# Patient Record
Sex: Male | Born: 1960 | Race: Black or African American | Hispanic: No | Marital: Single | Smoking: Current every day smoker
Health system: Southern US, Community
[De-identification: ages and names within clinical notes are randomized; demographics above are authoritative.]

## PROBLEM LIST (undated history)

## (undated) DIAGNOSIS — Z9151 Personal history of suicidal behavior: Secondary | ICD-10-CM

## (undated) DIAGNOSIS — I2510 Atherosclerotic heart disease of native coronary artery without angina pectoris: Secondary | ICD-10-CM

## (undated) DIAGNOSIS — Z91148 Patient's other noncompliance with medication regimen for other reason: Secondary | ICD-10-CM

## (undated) DIAGNOSIS — I10 Essential (primary) hypertension: Secondary | ICD-10-CM

## (undated) DIAGNOSIS — Z9114 Patient's other noncompliance with medication regimen: Secondary | ICD-10-CM

## (undated) DIAGNOSIS — F32A Depression, unspecified: Secondary | ICD-10-CM

## (undated) DIAGNOSIS — I119 Hypertensive heart disease without heart failure: Secondary | ICD-10-CM

## (undated) DIAGNOSIS — I422 Other hypertrophic cardiomyopathy: Secondary | ICD-10-CM

## (undated) DIAGNOSIS — F319 Bipolar disorder, unspecified: Secondary | ICD-10-CM

## (undated) DIAGNOSIS — R55 Syncope and collapse: Secondary | ICD-10-CM

## (undated) DIAGNOSIS — Z915 Personal history of self-harm: Secondary | ICD-10-CM

## (undated) DIAGNOSIS — F1910 Other psychoactive substance abuse, uncomplicated: Secondary | ICD-10-CM

## (undated) DIAGNOSIS — IMO0001 Reserved for inherently not codable concepts without codable children: Secondary | ICD-10-CM

## (undated) DIAGNOSIS — F329 Major depressive disorder, single episode, unspecified: Secondary | ICD-10-CM

## (undated) DIAGNOSIS — R440 Auditory hallucinations: Secondary | ICD-10-CM

## (undated) DIAGNOSIS — I639 Cerebral infarction, unspecified: Secondary | ICD-10-CM

## (undated) DIAGNOSIS — I350 Nonrheumatic aortic (valve) stenosis: Secondary | ICD-10-CM

## (undated) DIAGNOSIS — K219 Gastro-esophageal reflux disease without esophagitis: Secondary | ICD-10-CM

## (undated) DIAGNOSIS — I319 Disease of pericardium, unspecified: Secondary | ICD-10-CM

## (undated) HISTORY — DX: Nonrheumatic aortic (valve) stenosis: I35.0

## (undated) HISTORY — PX: PERICARDIAL FLUID DRAINAGE: SHX5100

## (undated) HISTORY — DX: Other hypertrophic cardiomyopathy: I42.2

## (undated) HISTORY — DX: Hypertensive heart disease without heart failure: I11.9

## (undated) HISTORY — DX: Bipolar disorder, unspecified: F31.9

## (undated) HISTORY — DX: Syncope and collapse: R55

## (undated) HISTORY — DX: Reserved for inherently not codable concepts without codable children: IMO0001

## (undated) HISTORY — DX: Atherosclerotic heart disease of native coronary artery without angina pectoris: I25.10

---

## 2003-08-14 ENCOUNTER — Other Ambulatory Visit: Payer: Self-pay

## 2004-05-09 ENCOUNTER — Inpatient Hospital Stay: Payer: Self-pay | Admitting: Unknown Physician Specialty

## 2004-06-09 ENCOUNTER — Emergency Department: Payer: Self-pay | Admitting: Emergency Medicine

## 2004-08-13 ENCOUNTER — Ambulatory Visit: Payer: Self-pay | Admitting: Psychiatry

## 2004-08-13 ENCOUNTER — Inpatient Hospital Stay (HOSPITAL_COMMUNITY): Admission: AD | Admit: 2004-08-13 | Discharge: 2004-08-20 | Payer: Self-pay | Admitting: Psychiatry

## 2004-08-13 ENCOUNTER — Emergency Department: Payer: Self-pay | Admitting: Emergency Medicine

## 2005-01-15 ENCOUNTER — Other Ambulatory Visit: Payer: Self-pay

## 2005-01-15 ENCOUNTER — Emergency Department: Payer: Self-pay | Admitting: Unknown Physician Specialty

## 2005-04-12 ENCOUNTER — Inpatient Hospital Stay: Payer: Self-pay | Admitting: Unknown Physician Specialty

## 2005-04-14 ENCOUNTER — Other Ambulatory Visit: Payer: Self-pay

## 2007-05-27 ENCOUNTER — Emergency Department: Payer: Self-pay | Admitting: Emergency Medicine

## 2008-06-21 ENCOUNTER — Emergency Department: Payer: Self-pay | Admitting: Emergency Medicine

## 2008-12-29 ENCOUNTER — Inpatient Hospital Stay (HOSPITAL_COMMUNITY): Admission: EM | Admit: 2008-12-29 | Discharge: 2009-01-04 | Payer: Self-pay | Admitting: Emergency Medicine

## 2008-12-29 ENCOUNTER — Ambulatory Visit: Payer: Self-pay | Admitting: Internal Medicine

## 2008-12-29 ENCOUNTER — Ambulatory Visit: Payer: Self-pay | Admitting: Cardiology

## 2008-12-30 ENCOUNTER — Encounter: Payer: Self-pay | Admitting: Cardiovascular Disease

## 2008-12-30 ENCOUNTER — Encounter (INDEPENDENT_AMBULATORY_CARE_PROVIDER_SITE_OTHER): Payer: Self-pay | Admitting: Internal Medicine

## 2008-12-30 ENCOUNTER — Encounter: Payer: Self-pay | Admitting: Internal Medicine

## 2008-12-31 ENCOUNTER — Encounter: Payer: Self-pay | Admitting: Internal Medicine

## 2008-12-31 ENCOUNTER — Ambulatory Visit: Payer: Self-pay | Admitting: Vascular Surgery

## 2009-01-02 ENCOUNTER — Encounter: Payer: Self-pay | Admitting: Cardiovascular Disease

## 2009-01-25 DIAGNOSIS — I10 Essential (primary) hypertension: Secondary | ICD-10-CM

## 2009-03-07 ENCOUNTER — Encounter (INDEPENDENT_AMBULATORY_CARE_PROVIDER_SITE_OTHER): Payer: Self-pay | Admitting: *Deleted

## 2010-03-06 NOTE — Letter (Signed)
Summary: Appointment - Missed  [facility], Main Office  [address]   [place], [place] [address]   Phone: [phone]  Fax: [phone]     March 07, 2009 MRN: [id number]   [facility] [place] [address] [place], [place]  [address]   Dear Mr. Lofaso,  Our records indicate you missed your appointment on 03/06/2009 with Dr. Eden Emms.   It is very important that we reach you to reschedule this appointment. We look forward to participating in your health care needs. Please contact us at the number listed above at your earliest convenience to reschedule this appointment.     Sincerely,   Migdalia Dk [facility] Scheduling Team

## 2010-05-08 LAB — GLUCOSE, CAPILLARY: Glucose-Capillary: 188 mg/dL — ABNORMAL HIGH (ref 70–99)

## 2010-05-09 LAB — AFB CULTURE WITH SMEAR (NOT AT ARMC): Acid Fast Smear: NONE SEEN

## 2010-05-09 LAB — HEPATIC FUNCTION PANEL
ALT: 62 U/L — ABNORMAL HIGH (ref 0–53)
Bilirubin, Direct: 0.2 mg/dL (ref 0.0–0.3)
Indirect Bilirubin: 0.4 mg/dL (ref 0.3–0.9)
Total Bilirubin: 0.6 mg/dL (ref 0.3–1.2)

## 2010-05-09 LAB — BASIC METABOLIC PANEL
BUN: 13 mg/dL (ref 6–23)
BUN: 14 mg/dL (ref 6–23)
BUN: 16 mg/dL (ref 6–23)
BUN: 18 mg/dL (ref 6–23)
BUN: 20 mg/dL (ref 6–23)
CO2: 25 mEq/L (ref 19–32)
CO2: 27 mEq/L (ref 19–32)
Calcium: 8.9 mg/dL (ref 8.4–10.5)
Calcium: 9 mg/dL (ref 8.4–10.5)
Chloride: 101 mEq/L (ref 96–112)
Chloride: 101 mEq/L (ref 96–112)
Chloride: 102 mEq/L (ref 96–112)
Chloride: 99 mEq/L (ref 96–112)
Creatinine, Ser: 1.04 mg/dL (ref 0.4–1.5)
Creatinine, Ser: 1.07 mg/dL (ref 0.4–1.5)
Creatinine, Ser: 1.09 mg/dL (ref 0.4–1.5)
Creatinine, Ser: 1.14 mg/dL (ref 0.4–1.5)
GFR calc Af Amer: >60 mL/min (ref >60)
GFR calc non Af Amer: >60 mL/min (ref >60)
GFR calc non Af Amer: >60 mL/min (ref >60)
GFR calc non Af Amer: >60 mL/min (ref >60)
Glucose, Bld: 177 mg/dL — ABNORMAL HIGH (ref 70–99)
Glucose, Bld: 241 mg/dL — ABNORMAL HIGH (ref 70–99)
Glucose, Bld: 353 mg/dL — ABNORMAL HIGH (ref 70–99)
Potassium: 3.5 mEq/L (ref 3.5–5.1)
Potassium: 3.7 mEq/L (ref 3.5–5.1)
Potassium: 3.8 mEq/L (ref 3.5–5.1)
Potassium: 3.9 mEq/L (ref 3.5–5.1)
Sodium: 134 mEq/L — ABNORMAL LOW (ref 135–145)
Sodium: 137 mEq/L (ref 135–145)

## 2010-05-09 LAB — GLUCOSE, CAPILLARY
Glucose-Capillary: 181 mg/dL — ABNORMAL HIGH (ref 70–99)
Glucose-Capillary: 184 mg/dL — ABNORMAL HIGH (ref 70–99)
Glucose-Capillary: 200 mg/dL — ABNORMAL HIGH (ref 70–99)
Glucose-Capillary: 208 mg/dL — ABNORMAL HIGH (ref 70–99)
Glucose-Capillary: 210 mg/dL — ABNORMAL HIGH (ref 70–99)
Glucose-Capillary: 231 mg/dL — ABNORMAL HIGH (ref 70–99)
Glucose-Capillary: 233 mg/dL — ABNORMAL HIGH (ref 70–99)
Glucose-Capillary: 233 mg/dL — ABNORMAL HIGH (ref 70–99)
Glucose-Capillary: 240 mg/dL — ABNORMAL HIGH (ref 70–99)
Glucose-Capillary: 291 mg/dL — ABNORMAL HIGH (ref 70–99)
Glucose-Capillary: 349 mg/dL — ABNORMAL HIGH (ref 70–99)

## 2010-05-09 LAB — LIPID PANEL
Cholesterol: 115 mg/dL (ref 0–200)
LDL Cholesterol: 66 mg/dL (ref 0–99)
Total CHOL/HDL Ratio: 5 RATIO
VLDL: 26 mg/dL (ref 0–40)

## 2010-05-09 LAB — CBC
HCT: 38.4 % — ABNORMAL LOW (ref 39.0–52.0)
HCT: 39.5 % (ref 39.0–52.0)
HCT: 39.5 % (ref 39.0–52.0)
HCT: 39.7 % (ref 39.0–52.0)
HCT: 40.2 % (ref 39.0–52.0)
Hemoglobin: 13.6 g/dL (ref 13.0–17.0)
Hemoglobin: 13.7 g/dL (ref 13.0–17.0)
MCHC: 34.8 g/dL (ref 30.0–36.0)
MCHC: 35.3 g/dL (ref 30.0–36.0)
MCV: 90.9 fL (ref 78.0–100.0)
MCV: 91.8 fL (ref 78.0–100.0)
MCV: 92 fL (ref 78.0–100.0)
MCV: 92.1 fL (ref 78.0–100.0)
MCV: 92.3 fL (ref 78.0–100.0)
Platelets: 289 10*3/uL (ref 150–400)
Platelets: 305 10*3/uL (ref 150–400)
Platelets: 329 10*3/uL (ref 150–400)
Platelets: 342 10*3/uL (ref 150–400)
RBC: 4.32 MIL/uL (ref 4.22–5.81)
RBC: 4.4 MIL/uL (ref 4.22–5.81)
RBC: 4.42 MIL/uL (ref 4.22–5.81)
RDW: 12 % (ref 11.5–15.5)
RDW: 12.6 % (ref 11.5–15.5)
WBC: 10.2 10*3/uL (ref 4.0–10.5)
WBC: 11 10*3/uL — ABNORMAL HIGH (ref 4.0–10.5)
WBC: 11.2 10*3/uL — ABNORMAL HIGH (ref 4.0–10.5)
WBC: 12.2 10*3/uL — ABNORMAL HIGH (ref 4.0–10.5)
WBC: 8.5 10*3/uL (ref 4.0–10.5)
WBC: 9.3 10*3/uL (ref 4.0–10.5)

## 2010-05-09 LAB — CARDIAC PANEL(CRET KIN+CKTOT+MB+TROPI)
CK, MB: 2.3 ng/mL (ref 0.3–4.0)
CK, MB: 2.5 ng/mL (ref 0.3–4.0)
CK, MB: 2.6 ng/mL (ref 0.3–4.0)
Relative Index: 0.5 (ref 0.0–2.5)
Total CK: 422 U/L — ABNORMAL HIGH (ref 7–232)
Total CK: 468 U/L — ABNORMAL HIGH (ref 7–232)
Total CK: 494 U/L — ABNORMAL HIGH (ref 7–232)
Troponin I: 0.03 ng/mL (ref 0.00–0.06)
Troponin I: 0.06 ng/mL (ref 0.00–0.06)

## 2010-05-09 LAB — COMPREHENSIVE METABOLIC PANEL
Alkaline Phosphatase: 67 U/L (ref 39–117)
BUN: 15 mg/dL (ref 6–23)
Chloride: 105 mEq/L (ref 96–112)
Creatinine, Ser: 1.11 mg/dL (ref 0.4–1.5)
GFR calc non Af Amer: >60 mL/min (ref >60)
Glucose, Bld: 214 mg/dL — ABNORMAL HIGH (ref 70–99)
Potassium: 3.9 mEq/L (ref 3.5–5.1)
Total Bilirubin: 1 mg/dL (ref 0.3–1.2)

## 2010-05-09 LAB — RAPID URINE DRUG SCREEN, HOSP PERFORMED
Amphetamines: NOT DETECTED
Barbiturates: NOT DETECTED
Benzodiazepines: NOT DETECTED

## 2010-05-09 LAB — POCT CARDIAC MARKERS
CKMB, poc: 2.6 ng/mL (ref 1.0–8.0)
Myoglobin, poc: 154 ng/mL (ref 12–200)
Myoglobin, poc: 200 ng/mL (ref 12–200)

## 2010-05-09 LAB — BODY FLUID CULTURE

## 2010-05-09 LAB — HEPATITIS PANEL, ACUTE
HCV Ab: NEGATIVE
Hep B C IgM: NEGATIVE
Hepatitis B Surface Ag: NEGATIVE

## 2010-05-09 LAB — PH, BODY FLUID: pH, Fluid: 8.5

## 2010-05-09 LAB — DIFFERENTIAL
Lymphs Abs: 2.2 10*3/uL (ref 0.7–4.0)
Monocytes Relative: 8 % (ref 3–12)
Neutro Abs: 8.6 10*3/uL — ABNORMAL HIGH (ref 1.7–7.7)
Neutrophils Relative %: 70 % (ref 43–77)

## 2010-05-09 LAB — GRAM STAIN

## 2010-05-09 LAB — LACTATE DEHYDROGENASE, PLEURAL OR PERITONEAL FLUID: LD, Fluid: 2116 U/L — ABNORMAL HIGH (ref 3–23)

## 2010-05-09 LAB — AMYLASE, BODY FLUID: Amylase, Fluid: 75 U/L

## 2010-05-09 LAB — TSH: TSH: 0.682 u[IU]/mL (ref 0.350–4.500)

## 2010-05-09 LAB — BODY FLUID CELL COUNT WITH DIFFERENTIAL
Eos, Fluid: 4 %
Monocyte-Macrophage-Serous Fluid: 15 % — ABNORMAL LOW (ref 50–90)
Other Cells, Fluid: 0 %

## 2010-05-09 LAB — HEMOGLOBIN A1C: Mean Plasma Glucose: 186 mg/dL

## 2010-05-09 LAB — GLUCOSE, SEROUS FLUID: Glucose, Fluid: 231 mg/dL

## 2010-05-09 LAB — SEDIMENTATION RATE: Sed Rate: 22 mm/hr — ABNORMAL HIGH (ref 0–16)

## 2010-05-09 LAB — C-REACTIVE PROTEIN: CRP: 5.9 mg/dL — ABNORMAL HIGH (ref <0.6)

## 2010-05-09 LAB — PROTEIN, BODY FLUID

## 2010-06-22 NOTE — Discharge Summary (Signed)
NAME:  JOHNJOSEPH, ROLFE NO.:  [id number]   MEDICAL RECORD NO.:  [id number]          PATIENT TYPE:  IPS   LOCATION:  0402                          FACILITY:  [facility]   PHYSICIAN:  Geoffery Lyons, M.D.      DATE OF BIRTH:  January 11, 1961   DATE OF ADMISSION:  08/13/2004  DATE OF DISCHARGE:  08/20/2004                                 DISCHARGE SUMMARY   CHIEF COMPLAINT AND PRESENT ILLNESS:  This was the first admission to [facility] for this 50 year old single African-American male  involuntarily committed.  History of depression.  Stated that his depression  has been increasing over the past several weeks.  Has been having homicidal  thoughts towards his father.  Stated that he was having thoughts to choke  his father for a lot of reasons.  Stated that he did not sleep well, only  about two hours at night.  Appetite has been satisfactory.  Reports auditory  hallucinations for approximately a year.  Denies that they are command-type  but stated that the voices tell him to stay away from everyone.  Has a  history of alcohol and crack cocaine and has been noncompliant with his  medication.   PAST PSYCHIATRIC HISTORY:  First time at [facility].  Hospitalized  after an overdose in the year 2000 at [facility].   ALCOHOL/DRUG HISTORY:  Nonsmoker.  Drinks on occasion.  Last drink was  Friday before this admission.  Drinking vodka, up to a fifth.  Also had been  smoking crack cocaine.   PAST MEDICAL HISTORY:  Hypertension.   MEDICATIONS:  Had been on Zoloft 50 mg per day, Risperdal 0.5 mg in the  morning and 0.25 mg at night, hydrochlorothiazide 50 mg per day, Norvasc 10  mg per day, Lopressor 15 mg twice a day.  Has been off of medication for  some time.   PHYSICAL EXAMINATION:  Performed and failed to show any acute findings.   LABORATORY DATA:  CBC with white blood cells 10.3, hemoglobin 15.8.  Drug  screening positive for cocaine.  Blood  chemistry with glucose 214.   MENTAL STATUS EXAM:  Fully alert, cooperative male with little eye contact.  Some mild guarding.  Lying in bed.  Speech was soft-spoken, normal in pace.  Mood was depressed.  Affect was constricted.  He endorsed homicidal  thoughts, positive auditory hallucinations and suicidal ideation.  Cognition  was well-preserved.   ADMISSION DIAGNOSES:  AXIS I:  Major depression with psychotic features.  Alcohol and cocaine abuse; rule out dependence.  AXIS II:  No diagnosis.  AXIS III:  Hypertension.  AXIS IV:  Moderate.  AXIS V:  GAF upon admission 25; highest GAF in the last year 60.   HOSPITAL COURSE:  He was admitted.  He was started in individual and group  psychotherapy.  He was given trazodone for sleep.  He was placed on a  sliding scale subcutaneous insulin.  He was detoxified with Librium.  He was  placed on Symmetrel 100 mg twice a day, Norvasc 5 mg per day,  hydrochlorothiazide 25 mg per day, Risperdal 0.5 mg every six hours as  needed.  He was placed on Norvasc 5 mg at night, hydrochlorothiazide 25 mg  per day.  He was given Protonix 40 mg per day and internal medicine was  consulted for better management for his high blood pressure and diabetes  mellitus.  He endorsed a history of substance use as well as depression.  Endorsed multiple events in his life.  He has been dealing with his mother's  illness and some other events.  He relapsed after being clean for a month.  He was going to go into __________ [employer].  Endorsed depression, suicidal  ideation, homicidal ideation but could contract for safety.  He endorsed a  long history of dealing with his blood pressure.  Questionable compliance  with medications.  He was aware of his increasing blood pressure and the  possible consequences.  Internal medicine continued to help to monitor and  treat his blood pressure.  There were some issues of having some homicidal  ideation towards the father due to  multiple conflicts between them.  He was  able to open up and talk about these events.  On July 13th, he became very  upset when he could not get in touch with his mother.  He was able to talk  to his father, who he claimed was not intoxicated, which for him is a rare  occasion.  Endorsed that he needed to find a place to go.  Endorsed positive  auditory hallucinations, voices telling him different things, negative  content.  We pursued the Risperdal, the hallucinations started improving.  They got to a point where they were telling him to hurt himself.  On July  14th, he said he could not tell what they were saying, they were more  murmurs.  He was able to talk to his mother who was not doing well.  This  aggravated some of his concern and his anxiety.  On July 17th, he was in  full contact with reality.  Overall, he was better.  There were no suicidal  ideation, no homicidal ideation, no hallucinations, no delusions.  Reported  that the voices were gone.  His mood was objectively improved.  His affect  was brighter.  Optimistic about his future.   DISCHARGE DIAGNOSES:  AXIS I:  Major depression with psychosis.  Alcohol and  cocaine abuse.  AXIS II:  No diagnosis.  AXIS III:  Arterial hypertension.  AXIS IV:  Moderate.  AXIS V:  GAF on discharge 50-55.   DISCHARGE MEDICATIONS:  1.  Symmetrel 100 mg twice a day.  2.  Hydrochlorothiazide 25 mg per day.  3.  Protonix 40 mg per day.  4.  Norvasc 10 mg per day.  5.  Neosporin ointment, apply twice a day.  6.  Catapres 0.2 mg three times a day.  7.  Normodyne 200 mg three times a day.  8.  Altace 2.5 mg daily.  9.  Glyburide 10 mg twice a day.  10. Lantus insulin 40 units daily in the morning.  11. Trazodone 100 mg at bedtime as needed for sleep.   FOLLOW UP:  He was going to follow up by Casandra Doffing in [place], [place].      Geoffery Lyons, M.D.  Electronically Signed    IL/MEDQ  D:  09/19/2004  T:  09/19/2004  Job:   [id number]

## 2010-06-22 NOTE — H&P (Signed)
NAME:  [name] [name], [name] [name] NO.:  [id number]   MEDICAL RECORD NO.:  [id number]          PATIENT TYPE:  IPS   LOCATION:  0402                          FACILITY:  [facility]   PHYSICIAN:  Geoffery Lyons, M.D.      DATE OF BIRTH:  11/13/1960   DATE OF ADMISSION:  08/13/2004  DATE OF DISCHARGE:                         PSYCHIATRIC ADMISSION ASSESSMENT   IDENTIFYING INFORMATION:  A 50 year old single African-American male,  involuntarily committed August 13, 2004.   HISTORY OF PRESENT ILLNESS:  The patient presents with a history of  depression, states his depression has been increasing over the past several  weeks.  He was having homicidal thoughts towards his father.  He states he  was having thoughts to choke his father for a lot of reasons.  States he  does not sleep well, only about 2 hours a night.  His appetite has been  satisfactory.  He reports auditory hallucinations for approximately a year,  denies that they are command type, but states the voices tell him to stay  away from everyone.  Has a history of suicidal thoughts, no specific plan.  Has a history of alcohol and crack cocaine use and has been noncompliant  with his medications.   PAST PSYCHIATRIC HISTORY:  First admission to [facility], was  hospitalized after an overdose in the year 2000 at [facility].   SOCIAL HISTORY:  He is a 50 year old single African-American male, has 2  children, lives with his parents.  He is unemployed.  Has a history of being  in jail in year 2000 for a DUI and also for an assault charge in 1999.  Has  a 10th grade education.   FAMILY HISTORY:  Unclear.   ALCOHOL DRUG HISTORY:  He is a nonsmoker, drinks on occasion, states his  last drink was on Friday, drinking vodka up to a fifth, and also has been  smoking crack cocaine.   PRIMARY CARE [place]:  Dr. Laural Benes in [place].   MEDICAL PROBLEMS:  Hypertension.   MEDICATIONS:  Has been on Zoloft 50 mg,  Risperdal 0.5 in the morning and  0.25 at bedtime, hydrochlorothiazide 50 mg, Norvasc 10 mg, and Lopressor 15  mg b.i.d. and has been off his medications for some time.   DRUG ALLERGIES:  No known allergies.   The patient was assessed at [facility] for medical clearance and  elevated blood pressure.  This is a tall, well-nourished male.  He is 295  pounds.  His blood pressures were elevated, with pressures as high as  221/144.  The patient did receive 2 doses of Clonidine, Ativan and thiamine  100 mg.  He is at this time complaining of a headache, with blood pressure  still running high, 187/111.  His urine drug screen is positive for cocaine.  WBC count is 12.3.  Glucose is 214.  Urinalysis showed mucus.   MENTAL STATUS EXAM:  Fully alert, cooperative, little eye contact, some mild  guarding, lying in the bed.  Speech is soft spoken, normal pace.  Mood is  depressed, affect is somewhat constricted.  The  patient endorsing homicidal  thoughts, positive auditory hallucinations and suicidal ideation.  Cognitive  function intact.  Memory is good, judgment is poor, insight is poor.   ADMISSION DIAGNOSES:  AXIS I:  Major depressive disorder with psychotic  symptoms, alcohol abuse, rule out dependence, cocaine abuse, rule out  dependence.  AXIS II:  Deferred.  AXIS III:  Hypertension.  AXIS IV:  Problems with primary support group, psychosocial problems.  AXIS V:  Current is 25, estimated this past year is 60.   TREATMENT PLAN:  Contract for safety.  The patient placed on the 400 hall  for close monitoring.  Stabilize mood and thinking.  We will check further  labs.  We will have internal medicine consult for the patient's elevated  blood pressures, symptomatic with headache, and elevated blood sugars.  We  will resume his Zoloft and Risperdal.  The patient is to increase his coping  skills.  We will speak to family for background and the patient's ability to  return home.  The  patient is to be medication compliant, follow up with  mental health.  We will also monitor the patient for signs of withdrawal  from alcohol and work on relapse prevention.   TENTATIVE LENGTH OF CARE:  5-7 days.       JO/MEDQ  D:  08/15/2004  T:  08/15/2004  Job:  [id number]

## 2010-11-07 ENCOUNTER — Encounter: Payer: Self-pay | Admitting: *Deleted

## 2010-11-07 ENCOUNTER — Emergency Department (HOSPITAL_COMMUNITY): Payer: Medicaid Other

## 2010-11-07 ENCOUNTER — Inpatient Hospital Stay (HOSPITAL_COMMUNITY)
Admission: EM | Admit: 2010-11-07 | Discharge: 2010-11-09 | DRG: 069 | Disposition: A | Discharge status: Home / Self Care | Payer: Medicaid Other | Attending: Internal Medicine | Admitting: Internal Medicine

## 2010-11-07 ENCOUNTER — Other Ambulatory Visit: Payer: Self-pay

## 2010-11-07 DIAGNOSIS — F3289 Other specified depressive episodes: Secondary | ICD-10-CM | POA: Diagnosis present

## 2010-11-07 DIAGNOSIS — F1210 Cannabis abuse, uncomplicated: Secondary | ICD-10-CM | POA: Diagnosis present

## 2010-11-07 DIAGNOSIS — IMO0001 Reserved for inherently not codable concepts without codable children: Secondary | ICD-10-CM | POA: Diagnosis present

## 2010-11-07 DIAGNOSIS — F329 Major depressive disorder, single episode, unspecified: Secondary | ICD-10-CM | POA: Diagnosis present

## 2010-11-07 DIAGNOSIS — I10 Essential (primary) hypertension: Secondary | ICD-10-CM | POA: Diagnosis present

## 2010-11-07 DIAGNOSIS — G459 Transient cerebral ischemic attack, unspecified: Principal | ICD-10-CM | POA: Diagnosis present

## 2010-11-07 DIAGNOSIS — Z5987 Material hardship due to limited financial resources, not elsewhere classified: Secondary | ICD-10-CM

## 2010-11-07 DIAGNOSIS — F1410 Cocaine abuse, uncomplicated: Secondary | ICD-10-CM | POA: Diagnosis present

## 2010-11-07 DIAGNOSIS — I674 Hypertensive encephalopathy: Secondary | ICD-10-CM

## 2010-11-07 DIAGNOSIS — Z598 Other problems related to housing and economic circumstances: Secondary | ICD-10-CM

## 2010-11-07 HISTORY — DX: Essential (primary) hypertension: I10

## 2010-11-07 HISTORY — DX: Gastro-esophageal reflux disease without esophagitis: K21.9

## 2010-11-07 HISTORY — DX: Disease of pericardium, unspecified: I31.9

## 2010-11-07 LAB — PRO B NATRIURETIC PEPTIDE: Pro B Natriuretic peptide (BNP): 266.7 pg/mL — ABNORMAL HIGH (ref 0–125)

## 2010-11-07 LAB — DIFFERENTIAL
Basophils Relative: 0 % (ref 0–1)
Eosinophils Absolute: 0.4 10*3/uL (ref 0.0–0.7)
Eosinophils Relative: 3 % (ref 0–5)
Lymphs Abs: 4.1 10*3/uL — ABNORMAL HIGH (ref 0.7–4.0)
Neutrophils Relative %: 54 % (ref 43–77)

## 2010-11-07 LAB — COMPREHENSIVE METABOLIC PANEL
ALT: 18 U/L (ref 0–53)
Albumin: 3.5 g/dL (ref 3.5–5.2)
Alkaline Phosphatase: 80 U/L (ref 39–117)
BUN: 20 mg/dL (ref 6–23)
Calcium: 9.3 mg/dL (ref 8.4–10.5)
GFR calc Af Amer: 84 mL/min — ABNORMAL LOW (ref >90)
Glucose, Bld: 210 mg/dL — ABNORMAL HIGH (ref 70–99)
Potassium: 3.7 mEq/L (ref 3.5–5.1)
Sodium: 134 mEq/L — ABNORMAL LOW (ref 135–145)
Total Protein: 6.6 g/dL (ref 6.0–8.3)

## 2010-11-07 LAB — CBC
MCH: 31.8 pg (ref 26.0–34.0)
MCHC: 36.3 g/dL — ABNORMAL HIGH (ref 30.0–36.0)
MCV: 87.4 fL (ref 78.0–100.0)
Platelets: 208 10*3/uL (ref 150–400)
RDW: 12.5 % (ref 11.5–15.5)

## 2010-11-07 LAB — POCT I-STAT TROPONIN I

## 2010-11-07 MED ORDER — NITROGLYCERIN IN D5W 200-5 MCG/ML-% IV SOLN
INTRAVENOUS | Status: AC
  Filled 2010-11-07: qty 250

## 2010-11-07 MED ORDER — LABETALOL HCL 5 MG/ML IV SOLN
20.0000 mg | Freq: Once | INTRAVENOUS | Status: AC
  Administered 2010-11-07: 20 mg via INTRAVENOUS
  Filled 2010-11-07: qty 4

## 2010-11-07 NOTE — ED Provider Notes (Signed)
History    Scribed for Benny Lennert, MD, the patient was seen in room APA07/APA07. This chart was scribed by Katha Cabal. This patient's care was started at 18:59.     CSN: [id number] Arrival date & time: 11/07/2010  5:09 PM  Chief Complaint  Patient presents with  . Numbness  . Tingling    (Consider location/radiation/quality/duration/timing/severity/associated sxs/prior treatment) HPI  [name] [name] is a 50 y.o. male who presents to the Emergency Department complaining of intermittent extremitiy numbness and tingling that began suddenly after waking up this am at 6:30am with associated sudden onset of fatigue.   Denies weakness.  Patient also c/o SOB that began about 1-2 months ago.  Patient adds that he had to have fluid pumped off his heart in the past.  SOB is worsened by lying flat and sitting upright helps.  Patient is out of his HTN medication but is compliant with 15 units of insulin daily.   Patient has not seen [place], [place] PCP in last couple of months due to transportation issues.  Patient has hx of GERD and pericarditis.    BP upon arrival was 188/120.      PAST MEDICAL HISTORY:  Past Medical History  Diagnosis Date  . Hypertension   . Diabetes mellitus   . GERD (gastroesophageal reflux disease)   . Pericarditis     PAST SURGICAL HISTORY:  Past Surgical History  Procedure Date  . Pericardial fluid drainage     FAMILY HISTORY:  History reviewed. No pertinent family history.   SOCIAL HISTORY: History   Social History  . Marital Status: Single    Spouse Name: N/A    Number of Children: N/A  . Years of Education: N/A   Social History Main Topics  . Smoking status: Current Everyday Smoker -- 0.3 packs/day  . Smokeless tobacco: None  . Alcohol Use: Yes     occasionally  . Drug Use: Yes    Special: Marijuana  . Sexually Active:    Other Topics Concern  . None   Social History Narrative  . None      Review of Systems  Constitutional:  Positive for fatigue.  HENT: Negative for congestion, sinus pressure and ear discharge.   Eyes: Negative for discharge.  Respiratory: Positive for shortness of breath. Negative for cough.   Cardiovascular: Negative for chest pain.  Gastrointestinal: Negative for abdominal pain and diarrhea.  Genitourinary: Negative for frequency and hematuria.  Musculoskeletal: Negative for back pain.  Skin: Negative for rash.  Neurological: Positive for numbness. Negative for seizures, weakness and headaches.  Hematological: Negative.   Psychiatric/Behavioral: Negative for hallucinations.    Allergies  Review of patient's allergies indicates no known allergies.  Home Medications   Current Outpatient Rx  Name Route Sig Dispense Refill  . NAPROXEN SODIUM 220 MG PO TABS Oral Take 220 mg by mouth daily as needed. For pain       BP 162/135  Pulse 75  Temp(Src) 98.6 F (37 C) (Oral)  Resp 24  Ht 5\' 11"  (1.803 m)  Wt 270 lb (122.471 kg)  BMI 37.66 kg/m2  SpO2 97%  Physical Exam  Constitutional: He is oriented to person, place, and time. He appears well-developed.       Patient is overweight.    HENT:  Head: Normocephalic and atraumatic.  Eyes: Conjunctivae and EOM are normal. No scleral icterus.  Neck: Neck supple. No thyromegaly present.  Cardiovascular: Normal rate and regular rhythm.  Exam reveals no gallop and  no friction rub.   No murmur heard. Pulmonary/Chest: Effort normal and breath sounds normal. No stridor. He has no wheezes. He has no rales. He exhibits no tenderness.  Abdominal: He exhibits no distension. There is no tenderness. There is no rebound.  Musculoskeletal: Normal range of motion. He exhibits no edema.  Lymphadenopathy:    He has no cervical adenopathy.  Neurological: He is oriented to person, place, and time. Coordination normal.       Increased sensation of distal left arm and distal left leg from knee down  Skin: No rash noted. No erythema.  Psychiatric: He has a  normal mood and affect. His behavior is normal.    ED Course  CRITICAL CARE Performed by: Juanna Pudlo L Authorized by: Bethann Berkshire L Total critical care time: 35 minutes Critical care was necessary to treat or prevent imminent or life-threatening deterioration of the following conditions: hypertensive crisis. Critical care was time spent personally by me on the following activities: development of treatment plan with patient or surrogate, discussions with consultants, examination of patient, evaluation of patient's response to treatment, obtaining history from patient or surrogate, ordering and performing treatments and interventions, ordering and review of laboratory studies, ordering and review of radiographic studies, re-evaluation of patient's condition and pulse oximetry.   (including critical care time)  Labs Reviewed  CBC - Abnormal; Notable for the following:    WBC 11.2 (*)    MCHC 36.3 (*)    All other components within normal limits  DIFFERENTIAL - Abnormal; Notable for the following:    Lymphs Abs 4.1 (*)    All other components within normal limits  COMPREHENSIVE METABOLIC PANEL - Abnormal; Notable for the following:    Sodium 134 (*)    Glucose, Bld 210 (*)    GFR calc non Af Amer 73 (*)    GFR calc Af Amer 84 (*)    All other components within normal limits  PRO B NATRIURETIC PEPTIDE - Abnormal; Notable for the following:    BNP, POC 266.7 (*)    All other components within normal limits  POCT I-STAT TROPONIN I  I-STAT TROPONIN I   OTHER DATA REVIEWED: Nursing notes, vital signs, and past medical records reviewed.   DIAGNOSTIC STUDIES: Oxygen Saturation is 98% on room air, normal by my interpretation.       LABS / RADIOLOGY:  Results for orders placed during the hospital encounter of 11/07/10  CBC      Component Value Range   WBC 11.2 (*) 4.0 - 10.5 (K/uL)   RBC 4.85  4.22 - 5.81 (MIL/uL)   Hemoglobin 15.4  13.0 - 17.0 (g/dL)   HCT 21.3  08.6 -  57.8 (%)   MCV 87.4  78.0 - 100.0 (fL)   MCH 31.8  26.0 - 34.0 (pg)   MCHC 36.3 (*) 30.0 - 36.0 (g/dL)   RDW 46.9  62.9 - 52.8 (%)   Platelets 208  150 - 400 (K/uL)  DIFFERENTIAL      Component Value Range   Neutrophils Relative 54  43 - 77 (%)   Neutro Abs 6.0  1.7 - 7.7 (K/uL)   Lymphocytes Relative 37  12 - 46 (%)   Lymphs Abs 4.1 (*) 0.7 - 4.0 (K/uL)   Monocytes Relative 6  3 - 12 (%)   Monocytes Absolute 0.7  0.1 - 1.0 (K/uL)   Eosinophils Relative 3  0 - 5 (%)   Eosinophils Absolute 0.4  0.0 - 0.7 (K/uL)  Basophils Relative 0  0 - 1 (%)   Basophils Absolute 0.0  0.0 - 0.1 (K/uL)  COMPREHENSIVE METABOLIC PANEL      Component Value Range   Sodium 134 (*) 135 - 145 (mEq/L)   Potassium 3.7  3.5 - 5.1 (mEq/L)   Chloride 101  96 - 112 (mEq/L)   CO2 24  19 - 32 (mEq/L)   Glucose, Bld 210 (*) 70 - 99 (mg/dL)   BUN 20  6 - 23 (mg/dL)   Creatinine, Ser 4.09  0.50 - 1.35 (mg/dL)   Calcium 9.3  8.4 - 81.1 (mg/dL)   Total Protein 6.6  6.0 - 8.3 (g/dL)   Albumin 3.5  3.5 - 5.2 (g/dL)   AST 17  0 - 37 (U/L)   ALT 18  0 - 53 (U/L)   Alkaline Phosphatase 80  39 - 117 (U/L)   Total Bilirubin 0.4  0.3 - 1.2 (mg/dL)   GFR calc non Af Amer 73 (*) >90 (mL/min)   GFR calc Af Amer 84 (*) >90 (mL/min)  PRO B NATRIURETIC PEPTIDE      Component Value Range   BNP, POC 266.7 (*) 0 - 125 (pg/mL)  POCT I-STAT TROPONIN I      Component Value Range   Troponin i, poc 0.00  0.00 - 0.08 (ng/mL)   Comment 3              Dg Chest 2 View  11/07/2010  *RADIOLOGY REPORT*  Clinical Data: Left-sided weakness  CHEST - 2 VIEW  Comparison: 01/01/2009  Findings: Cardiomediastinal silhouette is stable.  No acute infiltrate or pleural effusion.  No pulmonary edema.  Bony structures are stable.  IMPRESSION: No active disease.  No significant change.  Original Report Authenticated By: Natasha Mead, M.D.   Ct Head Wo Contrast  11/07/2010  *RADIOLOGY REPORT*  Clinical Data: Left-sided numbness and weakness  CT HEAD  WITHOUT CONTRAST  Technique:  Contiguous axial images were obtained from the base of the skull through the vertex without contrast.  Comparison: None.  Findings: Wallace Cullens white differentiation is maintained.  No CT evidence of acute large territory infarct.  No intraparenchymal or extra- axial mass or hemorrhage.  Normal size and configuration of the ventricles and basilar cisterns.  No midline shift.  Polypoid mucosal thickening within the left maxillary sinus.  Remaining paranasal sinuses and mastoid air cells are normal.  No air fluid levels.  The regional soft tissues are normal.  No calvarial fracture.  IMPRESSION:  Polypoid left maxillary sinus disease.  Otherwise normal noncontrast head CT.  Original Report Authenticated By: Waynard Reeds, M.D.       ED COURSE / COORDINATION OF CARE: 7:08 PM  Physical exam complete.  Discussed need for compliance with HTN medications.  BP upon arrival 188/120 and upon exam 170/ 107.    7:11 PM Ordered labs and CT Head and CXR.   10:28 PM Patient is still hypertensive.  Patient would like to be admitted.  Plan to admit patient.   10:37 PM  Spoke with Dr. Orvan Falconer Internal Medicine regarding admission and hypertension.  Will admit patient.     Orders Placed This Encounter  Procedures  . DG Chest 2 View  . CT Head Wo Contrast  . CBC  . Differential  . Comprehensive metabolic panel  . Pro b natriuretic peptide  . Consult to internal medicine  . POCT i-Stat troponin I  . ED EKG  MDM   Hypertensive crisis   IMPRESSION: Diagnoses that have been ruled out:  Diagnoses that are still under consideration:  Final diagnoses:  Hypertensive encephalopathy     MEDICATIONS GIVEN IN THE E.D. Scheduled Meds:    . labetalol  20 mg Intravenous Once   Continuous Infusions:     DISCHARGE MEDICATIONS: New Prescriptions   No medications on file    Date: 11/07/2010  Rate:76  Rhythm: normal sinus rhythm  QRS Axis: left  Intervals:  normal  ST/T Wave abnormalities: nonspecific ST changes  Some inverted t waves  Conduction Disutrbances:none  Narrative Interpretation:   Old EKG Reviewed: unchanged    The chart was scribed for me under my direct supervision.  I personally performed the history, physical, and medical decision making and all procedures in the evaluation of this patient.Benny Lennert, MD 11/07/10 [id number]

## 2010-11-07 NOTE — ED Notes (Signed)
Pt c/o numbness and tingling to extremities. Pt states sx started this am early shortly after waking up. Pt states he became fatigued 30 min after onset of sx.

## 2010-11-08 ENCOUNTER — Inpatient Hospital Stay (HOSPITAL_COMMUNITY): Payer: Medicaid Other

## 2010-11-08 ENCOUNTER — Encounter (HOSPITAL_COMMUNITY): Payer: Self-pay | Admitting: *Deleted

## 2010-11-08 LAB — BASIC METABOLIC PANEL
Calcium: 8.8 mg/dL (ref 8.4–10.5)
GFR calc Af Amer: 73 mL/min — ABNORMAL LOW (ref >90)
GFR calc non Af Amer: 63 mL/min — ABNORMAL LOW (ref >90)
Glucose, Bld: 256 mg/dL — ABNORMAL HIGH (ref 70–99)
Potassium: 3.2 mEq/L — ABNORMAL LOW (ref 3.5–5.1)
Sodium: 137 mEq/L (ref 135–145)

## 2010-11-08 LAB — GLUCOSE, CAPILLARY
Glucose-Capillary: 233 mg/dL — ABNORMAL HIGH (ref 70–99)
Glucose-Capillary: 281 mg/dL — ABNORMAL HIGH (ref 70–99)

## 2010-11-08 LAB — URINALYSIS, ROUTINE W REFLEX MICROSCOPIC
Bilirubin Urine: NEGATIVE
Glucose, UA: >1000 mg/dL — AB
Hgb urine dipstick: NEGATIVE
Ketones, ur: NEGATIVE mg/dL
Leukocytes, UA: NEGATIVE
Protein, ur: NEGATIVE mg/dL
pH: 6 (ref 5.0–8.0)

## 2010-11-08 LAB — CARDIAC PANEL(CRET KIN+CKTOT+MB+TROPI)
CK, MB: 4.2 ng/mL — ABNORMAL HIGH (ref 0.3–4.0)
CK, MB: 4.2 ng/mL — ABNORMAL HIGH (ref 0.3–4.0)
Relative Index: 1 (ref 0.0–2.5)
Total CK: 429 U/L — ABNORMAL HIGH (ref 7–232)
Total CK: 395 U/L — ABNORMAL HIGH (ref 7–232)
Troponin I: <0.3 ng/mL (ref <0.30)

## 2010-11-08 LAB — CBC
Hemoglobin: 14.1 g/dL (ref 13.0–17.0)
MCH: 31.3 pg (ref 26.0–34.0)
MCHC: 35.6 g/dL (ref 30.0–36.0)
Platelets: 181 10*3/uL (ref 150–400)
RBC: 4.51 MIL/uL (ref 4.22–5.81)

## 2010-11-08 LAB — RAPID URINE DRUG SCREEN, HOSP PERFORMED
Barbiturates: NOT DETECTED
Tetrahydrocannabinol: POSITIVE — AB

## 2010-11-08 LAB — MRSA PCR SCREENING: MRSA by PCR: NEGATIVE

## 2010-11-08 LAB — HEMOGLOBIN A1C: Hgb A1c MFr Bld: 8.1 % — ABNORMAL HIGH (ref <5.7)

## 2010-11-08 LAB — PROTIME-INR: Prothrombin Time: 13.5 seconds (ref 11.6–15.2)

## 2010-11-08 MED ORDER — FOLIC ACID 1 MG PO TABS
1.0000 mg | ORAL_TABLET | Freq: Every day | ORAL | Status: DC
  Administered 2010-11-08 – 2010-11-09 (×2): 1 mg via ORAL
  Filled 2010-11-08 (×3): qty 1

## 2010-11-08 MED ORDER — ASPIRIN EC 81 MG PO TBEC
81.0000 mg | DELAYED_RELEASE_TABLET | Freq: Every day | ORAL | Status: DC
  Administered 2010-11-08 – 2010-11-09 (×2): 81 mg via ORAL
  Filled 2010-11-08 (×2): qty 1

## 2010-11-08 MED ORDER — PANTOPRAZOLE SODIUM 40 MG PO TBEC
40.0000 mg | DELAYED_RELEASE_TABLET | Freq: Every day | ORAL | Status: DC
  Administered 2010-11-08: 40 mg via ORAL
  Filled 2010-11-08: qty 1

## 2010-11-08 MED ORDER — POTASSIUM CHLORIDE CRYS ER 20 MEQ PO TBCR
40.0000 meq | EXTENDED_RELEASE_TABLET | Freq: Once | ORAL | Status: AC
  Administered 2010-11-08: 40 meq via ORAL
  Filled 2010-11-08: qty 2

## 2010-11-08 MED ORDER — AMLODIPINE BESYLATE 5 MG PO TABS
5.0000 mg | ORAL_TABLET | Freq: Every day | ORAL | Status: DC
  Administered 2010-11-08 – 2010-11-09 (×2): 5 mg via ORAL
  Filled 2010-11-08 (×2): qty 1

## 2010-11-08 MED ORDER — ACETAMINOPHEN 325 MG PO TABS: 650.0000 mg | ORAL_TABLET | Freq: Four times a day (QID) | ORAL | Status: DC | PRN

## 2010-11-08 MED ORDER — ONDANSETRON HCL 4 MG/2ML IJ SOLN: 4.0000 mg | Freq: Four times a day (QID) | INTRAMUSCULAR | Status: DC | PRN

## 2010-11-08 MED ORDER — HYDROMORPHONE HCL 1 MG/ML IJ SOLN: 0.5000 mg | INTRAMUSCULAR | Status: DC | PRN

## 2010-11-08 MED ORDER — CLONIDINE HCL 0.2 MG PO TABS
0.2000 mg | ORAL_TABLET | Freq: Two times a day (BID) | ORAL | Status: DC
  Administered 2010-11-08: 0.2 mg via ORAL
  Filled 2010-11-08: qty 1

## 2010-11-08 MED ORDER — NITROGLYCERIN IN D5W 200-5 MCG/ML-% IV SOLN
2.0000 ug/min | INTRAVENOUS | Status: DC
  Administered 2010-11-08: 10 ug/min via INTRAVENOUS

## 2010-11-08 MED ORDER — INSULIN ASPART 100 UNIT/ML ~~LOC~~ SOLN
0.0000 [IU] | Freq: Every day | SUBCUTANEOUS | Status: DC
  Administered 2010-11-08: 0 [IU] via SUBCUTANEOUS

## 2010-11-08 MED ORDER — LISINOPRIL 10 MG PO TABS
20.0000 mg | ORAL_TABLET | Freq: Two times a day (BID) | ORAL | Status: DC
  Administered 2010-11-08 – 2010-11-09 (×4): 20 mg via ORAL
  Filled 2010-11-08 (×4): qty 2

## 2010-11-08 MED ORDER — ONDANSETRON HCL 4 MG PO TABS: 4.0000 mg | ORAL_TABLET | Freq: Four times a day (QID) | ORAL | Status: DC | PRN

## 2010-11-08 MED ORDER — HYDRALAZINE HCL 20 MG/ML IJ SOLN
10.0000 mg | INTRAMUSCULAR | Status: DC | PRN
  Administered 2010-11-08: 10 mg via INTRAVENOUS
  Filled 2010-11-08: qty 1

## 2010-11-08 MED ORDER — OXYCODONE HCL 5 MG PO TABS
5.0000 mg | ORAL_TABLET | ORAL | Status: DC | PRN
  Administered 2010-11-08 (×2): 5 mg via ORAL
  Filled 2010-11-08 (×2): qty 1

## 2010-11-08 MED ORDER — SODIUM CHLORIDE 0.9 % IJ SOLN
3.0000 mL | Freq: Two times a day (BID) | INTRAMUSCULAR | Status: DC
  Administered 2010-11-08 (×3): 3 mL via INTRAVENOUS
  Filled 2010-11-08 (×4): qty 3

## 2010-11-08 MED ORDER — INSULIN GLARGINE 100 UNIT/ML ~~LOC~~ SOLN
15.0000 [IU] | Freq: Every day | SUBCUTANEOUS | Status: DC
  Administered 2010-11-08: 15 [IU] via SUBCUTANEOUS
  Filled 2010-11-08: qty 3

## 2010-11-08 MED ORDER — TRAZODONE HCL 50 MG PO TABS
50.0000 mg | ORAL_TABLET | Freq: Every evening | ORAL | Status: DC | PRN
  Administered 2010-11-08 (×2): 50 mg via ORAL
  Filled 2010-11-08 (×2): qty 1

## 2010-11-08 MED ORDER — FLEET ENEMA 7-19 GM/118ML RE ENEM: 1.0000 | ENEMA | RECTAL | Status: DC | PRN

## 2010-11-08 MED ORDER — TRAZODONE HCL 50 MG PO TABS: 25.0000 mg | ORAL_TABLET | Freq: Every evening | ORAL | Status: DC | PRN

## 2010-11-08 MED ORDER — INSULIN ASPART 100 UNIT/ML ~~LOC~~ SOLN
0.0000 [IU] | Freq: Three times a day (TID) | SUBCUTANEOUS | Status: DC
  Administered 2010-11-08: 8 [IU] via SUBCUTANEOUS
  Administered 2010-11-08 – 2010-11-09 (×3): 5 [IU] via SUBCUTANEOUS
  Filled 2010-11-08: qty 3

## 2010-11-08 MED ORDER — INFLUENZA VIRUS VACC SPLIT PF IM SUSP
0.5000 mL | Freq: Once | INTRAMUSCULAR | Status: AC
  Administered 2010-11-08: 0.5 mL via INTRAMUSCULAR
  Filled 2010-11-08: qty 0.5

## 2010-11-08 MED ORDER — SODIUM CHLORIDE 0.9 % IV SOLN
250.0000 mL | INTRAVENOUS | Status: DC
  Administered 2010-11-08: 250 mL via INTRAVENOUS

## 2010-11-08 MED ORDER — NICOTINE 14 MG/24HR TD PT24
14.0000 mg | MEDICATED_PATCH | Freq: Every day | TRANSDERMAL | Status: DC
  Filled 2010-11-08 (×5): qty 1

## 2010-11-08 MED ORDER — DOCUSATE SODIUM 100 MG PO CAPS: 100.0000 mg | ORAL_CAPSULE | Freq: Two times a day (BID) | ORAL | Status: DC | PRN

## 2010-11-08 MED ORDER — ACETAMINOPHEN 650 MG RE SUPP: 650.0000 mg | Freq: Four times a day (QID) | RECTAL | Status: DC | PRN

## 2010-11-08 MED ORDER — CLONIDINE HCL 0.2 MG PO TABS
0.3000 mg | ORAL_TABLET | Freq: Two times a day (BID) | ORAL | Status: DC
  Administered 2010-11-08 – 2010-11-09 (×3): 0.3 mg via ORAL
  Filled 2010-11-08: qty 1
  Filled 2010-11-08 (×2): qty 3

## 2010-11-08 MED ORDER — BISACODYL 10 MG RE SUPP: 10.0000 mg | RECTAL | Status: DC | PRN

## 2010-11-08 MED ORDER — SODIUM CHLORIDE 0.9 % IJ SOLN: 3.0000 mL | INTRAMUSCULAR | Status: DC | PRN

## 2010-11-08 NOTE — H&P (Addendum)
PCP:   No primary provider on file.   Chief Complaint:  Numbness of left side since this morning  HPI: [name] [name] is an 50 y.o. African American gentleman, diabetes, hypertension, and depression who usually gets his care at the [facility] hasn't missed appointment and hasn't been out of his antihypertensives for some time. Patient has a continued tol abuse cocaine and marijuana, at least once per week, and also reports drinking of 1 pint of vodka once per month on payday when he gets his disability check.  He presents with a history of numbness of his left arm and left leg since 6:30 this morning. He took no specific treatments and it would go away but the feeling is on and off until 4 PM today when he presented to the emergency room. In the emergency room his blood pressure is noted to have markedly elevated, no acute brain injury was found on the hospitalist service was called to assist with management  Patient also reports increasing shortness of breath for the past one month.   Past Medical History  Diagnosis Date  . Hypertension   . Diabetes mellitus   . GERD (gastroesophageal reflux disease)   . Pericarditis     Past Surgical History  Procedure Date  . Pericardial fluid drainage     Medications: Patient reports he takes 15 units of insulin daily does note that type. does not know his blood pressure medication.  Allergies:  No Known Allergies  Social History:   reports that he has been smoking.  He does not have any smokeless tobacco history on file. He reports that he drinks alcohol. He reports that he uses illicit drugs (Marijuana and Cocaine) about once per week. 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  Physical Exam: Filed Vitals:   11/07/10 2245 11/07/10 2300 11/07/10 2345 11/08/10 0011  BP:  170/85  183/117  Pulse: 75 74  80  Temp:   98.1 F (36.7 C)   TempSrc:   Oral Oral  Resp:    15  Height:   5\' 11"  (1.803 m) 5\' 11"  (1.803 m)  Weight:   124 kg (273 lb 5.9 oz) 124 kg (273 lb 5.9 oz)  SpO2: 96% 96%  97%   Blood pressure 183/117, pulse 80, temperature 98.1 F (36.7 C), temperature source Oral, resp. rate 15, height 5\' 11"  (1.803 m), weight 124 kg (273 lb 5.9 oz), SpO2 97.00%.  GEN: Obese African American gentleman, lying in the stretcher; cooperative with exam PSYCH: He is alert and oriented x4; mildly anxious; affect is flat HEENT: Mucous membranes pink and anicteric; bilateral cataracts ; EOM intact; no cervical lymphadenopathy nor thyromegaly or carotid bruit; no JVD; CHEST WALL: No tenderness CHEST: Normal respiration, clear to auscultation bilaterally HEART: Regular rate and rhythm;2/6 systolic murmur; gallops BACK: No kyphosis or scoliosis; no CVA tenderness ABDOMEN: Obese, soft non-tender; no masses, no organomegaly, normal abdominal bowel sounds;  no intertriginous candida. Rectal Exam: Not done EXTREMITIES: No bone or joint deformity; age-appropriate arthropathy of the hands and knees; no  edema; no ulcerations. Genitalia: not examined PULSES: 2+ and symmetric SKIN: Normal hydration no rash or ulceration CNS: Cranial nerves 2-12 grossly intact no focal motor deficit; reflexes equal   Labs & Imaging Results for orders placed during the hospital encounter of 11/07/10 (from the past 48 hour(s))  CBC     Status: Abnormal   Collection Time   11/07/10   7:11 PM      Component Value Range Comment   WBC 11.2 (*) 4.0 - 10.5 (K/uL)    RBC 4.85  4.22 - 5.81 (MIL/uL)    Hemoglobin 15.4  13.0 - 17.0 (g/dL)    HCT 16.1  09.6 - 04.5 (%)    MCV 87.4  78.0 - 100.0 (fL)    MCH 31.8  26.0 - 34.0 (pg)    MCHC 36.3 (*) 30.0 - 36.0 (g/dL)    RDW 40.9  81.1 - 91.4 (%)    Platelets 208  150 - 400 (K/uL)   DIFFERENTIAL     Status: Abnormal   Collection Time   11/07/10  7:11 PM      Component Value Range Comment   Neutrophils Relative 54  43 - 77 (%)    Neutro Abs 6.0  1.7 - 7.7 (K/uL)    Lymphocytes Relative 37  12 - 46 (%)    Lymphs Abs 4.1 (*) 0.7 - 4.0 (K/uL)    Monocytes Relative 6  3 - 12 (%)    Monocytes Absolute 0.7  0.1 - 1.0 (K/uL)    Eosinophils Relative 3  0 - 5 (%)    Eosinophils Absolute 0.4  0.0 - 0.7 (K/uL)    Basophils Relative 0  0 - 1 (%)    Basophils Absolute 0.0  0.0 - 0.1 (K/uL)   COMPREHENSIVE METABOLIC PANEL     Status: Abnormal   Collection Time   11/07/10  7:11 PM      Component Value Range Comment   Sodium 134 (*) 135 - 145 (mEq/L)    Potassium 3.7  3.5 - 5.1 (mEq/L)    Chloride 101  96 - 112 (mEq/L)    CO2 24  19 - 32 (mEq/L)    Glucose, Bld 210 (*) 70 - 99 (mg/dL)    BUN 20  6 - 23 (mg/dL)    Creatinine, Ser 7.82  0.50 - 1.35 (mg/dL)    Calcium 9.3  8.4 - 10.5 (mg/dL)    Total Protein 6.6  6.0 - 8.3 (g/dL)    Albumin 3.5  3.5 - 5.2 (g/dL)    AST 17  0 - 37 (U/L)    ALT 18  0 - 53 (U/L)    Alkaline Phosphatase 80  39 - 117 (U/L)    Total Bilirubin 0.4  0.3 - 1.2 (mg/dL)    GFR calc non Af Amer 73 (*) >90 (mL/min)    GFR calc Af Amer 84 (*) >90 (mL/min)   PRO B NATRIURETIC PEPTIDE     Status: Abnormal   Collection Time   11/07/10  7:11 PM      Component Value Range Comment   BNP, POC 266.7 (*) 0 - 125 (pg/mL)   POCT I-STAT TROPONIN I     Status: Normal   Collection Time   11/07/10  7:36 PM      Component Value Range Comment   Troponin i, poc 0.00  0.00 - 0.08 (ng/mL)    Comment 3  Dg Chest 2  View  11/07/2010  *RADIOLOGY REPORT*  Clinical Data: Left-sided weakness  CHEST - 2 VIEW  Comparison: 01/01/2009  Findings: Cardiomediastinal silhouette is stable.  No acute infiltrate or pleural effusion.  No pulmonary edema.  Bony structures are stable.  IMPRESSION: No active disease.  No significant change.  Original Report Authenticated By: Natasha Mead, M.D.   Ct Head Wo Contrast  11/07/2010  *RADIOLOGY REPORT*  Clinical Data: Left-sided numbness and weakness  CT HEAD WITHOUT CONTRAST  Technique:  Contiguous axial images were obtained from the base of the skull through the vertex without contrast.  Comparison: None.  Findings: Wallace Cullens white differentiation is maintained.  No CT evidence of acute large territory infarct.  No intraparenchymal or extra- axial mass or hemorrhage.  Normal size and configuration of the ventricles and basilar cisterns.  No midline shift.  Polypoid mucosal thickening within the left maxillary sinus.  Remaining paranasal sinuses and mastoid air cells are normal.  No air fluid levels.  The regional soft tissues are normal.  No calvarial fracture.  IMPRESSION:  Polypoid left maxillary sinus disease.  Otherwise normal noncontrast head CT.  Original Report Authenticated By: Waynard Reeds, M.D.      Assessment Present on Admission:   Hypertensive encephalopathy .Polysubstance abuse .DM .HYPERTENSION .PSYCHIATRIC DISORDER .Shortness of breath   PLAN: 1 admit to intensive care unit for emergent treatment of this ligament hypertension. Will avoid beta blockers for the time being in view of this cocaine history. As well as a nitroglycerin drip, and when necessary intravenous hydralazine, will give twice daily clonidine, and an ACE inhibitor, which can be titrated and adjusted as his blood pressure improves.  Sliding scale insulin for his diabetes for the time being.   Other plans as per orders.  Critical care time: 60 minutes.  Shyasia Funches 11/08/2010, 12:13 AM

## 2010-11-08 NOTE — Progress Notes (Signed)
Inpatient Diabetes Program Recommendations  AACE/ADA: New Consensus Statement on Inpatient Glycemic Control (2009)  Target Ranges:  Prepandial:   less than 140 mg/dL      Peak postprandial:   less than 180 mg/dL (1-2 hours)      Critically ill patients:  140 - 180 mg/dL   Reason for Visit: Elevated glucose:  256 mg/dL  Inpatient Diabetes Program Recommendations Insulin - Basal: Add Lantus 15 units qhs:  Home dose per medical records from last admission at [facility]

## 2010-11-08 NOTE — Progress Notes (Signed)
Paged Dr. Karilyn Cota to let him know that pt's BP remained high despite Catapres and Lisinopril as ordered.  Also inquired about potassium replacement and initiation of Lantus.  Orders placed in computer by Dr. Karilyn Cota.  Will continue to monitor.

## 2010-11-08 NOTE — Progress Notes (Signed)
Subjective: This patient was admitted admitted yesterday and the history he gives to me is one of actual weakness of the left leg and to some degree the left arm associated with numbness. The weakness lasted only a few hours. His blood pressure was clearly elevated when he came into the hospital and he has been on a nitroglycerin drip. This morning his blood pressure is much more reasonable. He tells me that he has not been compliant with taking his medications recently. He currently has a headache with a nitroglycerin drip.           Physical Exam: Blood pressure 145/90, pulse 62, temperature 98.4 F (36.9 C), temperature source Oral, resp. rate 25, height 5\' 11"  (1.803 m), weight 124 kg (273 lb 5.9 oz), SpO2 94.00%. He looks systemically well. There are no cranial nerve abnormalities. His speech is normal. He has no weakness whatsoever in his left arm or leg. His right side is entirely normal. There are no carotid bruits. Heart sounds are present and normal without murmurs. Lung fields are clear.   Investigations: Results for orders placed during the hospital encounter of 11/07/10 (from the past 48 hour(s))  CBC     Status: Abnormal   Collection Time   11/07/10  7:11 PM      Component Value Range Comment   WBC 11.2 (*) 4.0 - 10.5 (K/uL)    RBC 4.85  4.22 - 5.81 (MIL/uL)    Hemoglobin 15.4  13.0 - 17.0 (g/dL)    HCT 78.4  69.6 - 29.5 (%)    MCV 87.4  78.0 - 100.0 (fL)    MCH 31.8  26.0 - 34.0 (pg)    MCHC 36.3 (*) 30.0 - 36.0 (g/dL)    RDW 28.4  13.2 - 44.0 (%)    Platelets 208  150 - 400 (K/uL)   DIFFERENTIAL     Status: Abnormal   Collection Time   11/07/10  7:11 PM      Component Value Range Comment   Neutrophils Relative 54  43 - 77 (%)    Neutro Abs 6.0  1.7 - 7.7 (K/uL)    Lymphocytes Relative 37  12 - 46 (%)    Lymphs Abs 4.1 (*) 0.7 - 4.0 (K/uL)    Monocytes Relative 6  3 - 12 (%)    Monocytes Absolute 0.7  0.1 - 1.0 (K/uL)    Eosinophils Relative 3  0 - 5 (%)    Eosinophils Absolute 0.4  0.0 - 0.7 (K/uL)    Basophils Relative 0  0 - 1 (%)    Basophils Absolute 0.0  0.0 - 0.1 (K/uL)   COMPREHENSIVE METABOLIC PANEL     Status: Abnormal   Collection Time   11/07/10  7:11 PM      Component Value Range Comment   Sodium 134 (*) 135 - 145 (mEq/L)    Potassium 3.7  3.5 - 5.1 (mEq/L)    Chloride 101  96 - 112 (mEq/L)    CO2 24  19 - 32 (mEq/L)    Glucose, Bld 210 (*) 70 - 99 (mg/dL)    BUN 20  6 - 23 (mg/dL)    Creatinine, Ser 1.02  0.50 - 1.35 (mg/dL)    Calcium 9.3  8.4 - 10.5 (mg/dL)    Total Protein 6.6  6.0 - 8.3 (g/dL)    Albumin 3.5  3.5 - 5.2 (g/dL)    AST 17  0 - 37 (U/L)    ALT 18  0 - 53 (U/L)    Alkaline Phosphatase 80  39 - 117 (U/L)    Total Bilirubin 0.4  0.3 - 1.2 (mg/dL)    GFR calc non Af Amer 73 (*) >90 (mL/min)    GFR calc Af Amer 84 (*) >90 (mL/min)   PRO B NATRIURETIC PEPTIDE     Status: Abnormal   Collection Time   11/07/10  7:11 PM      Component Value Range Comment   BNP, POC 266.7 (*) 0 - 125 (pg/mL)   POCT I-STAT TROPONIN I     Status: Normal   Collection Time   11/07/10  7:36 PM      Component Value Range Comment   Troponin i, poc 0.00  0.00 - 0.08 (ng/mL)    Comment 3            MRSA PCR SCREENING     Status: Normal   Collection Time   11/07/10 11:44 PM      Component Value Range Comment   MRSA by PCR NEGATIVE  NEGATIVE    APTT     Status: Normal   Collection Time   11/08/10 12:04 AM      Component Value Range Comment   aPTT 31  24 - 37 (seconds)   PROTIME-INR     Status: Normal   Collection Time   11/08/10 12:04 AM      Component Value Range Comment   Prothrombin Time 13.5  11.6 - 15.2 (seconds)    INR 1.01  0.00 - 1.49    URINALYSIS, ROUTINE W REFLEX MICROSCOPIC     Status: Abnormal   Collection Time   11/08/10  4:37 AM      Component Value Range Comment   Color, Urine AMBER (*) YELLOW  BIOCHEMICALS MAY BE AFFECTED BY COLOR   Appearance CLEAR  CLEAR     Specific Gravity, Urine 1.025  1.005 - 1.030      pH 6.0  5.0 - 8.0     Glucose, UA >1000 (*) NEGATIVE (mg/dL)    Hgb urine dipstick NEGATIVE  NEGATIVE     Bilirubin Urine NEGATIVE  NEGATIVE     Ketones, ur NEGATIVE  NEGATIVE (mg/dL)    Protein, ur NEGATIVE  NEGATIVE (mg/dL)    Urobilinogen, UA 1.0  0.0 - 1.0 (mg/dL)    Nitrite NEGATIVE  NEGATIVE     Leukocytes, UA NEGATIVE  NEGATIVE    URINE RAPID DRUG SCREEN (HOSP PERFORMED)     Status: Abnormal   Collection Time   11/08/10  4:37 AM      Component Value Range Comment   Opiates NONE DETECTED  NONE DETECTED     Cocaine NONE DETECTED  NONE DETECTED     Benzodiazepines NONE DETECTED  NONE DETECTED     Amphetamines NONE DETECTED  NONE DETECTED     Tetrahydrocannabinol POSITIVE (*) NONE DETECTED     Barbiturates NONE DETECTED  NONE DETECTED    CARDIAC PANEL(CRET KIN+CKTOT+MB+TROPI)     Status: Abnormal   Collection Time   11/08/10  5:33 AM      Component Value Range Comment   Total CK 429 (*) 7 - 232 (U/L)    CK, MB 4.2 (*) 0.3 - 4.0 (ng/mL)    Troponin I <0.30  <0.30 (ng/mL)    Relative Index 1.0  0.0 - 2.5    BASIC METABOLIC PANEL     Status: Abnormal   Collection Time   11/08/10  5:33 AM  Component Value Range Comment   Sodium 137  135 - 145 (mEq/L)    Potassium 3.2 (*) 3.5 - 5.1 (mEq/L)    Chloride 101  96 - 112 (mEq/L)    CO2 28  19 - 32 (mEq/L)    Glucose, Bld 256 (*) 70 - 99 (mg/dL)    BUN 17  6 - 23 (mg/dL)    Creatinine, Ser 1.61  0.50 - 1.35 (mg/dL)    Calcium 8.8  8.4 - 10.5 (mg/dL)    GFR calc non Af Amer 63 (*) >90 (mL/min)    GFR calc Af Amer 73 (*) >90 (mL/min)   CBC     Status: Normal   Collection Time   11/08/10  5:33 AM      Component Value Range Comment   WBC 9.4  4.0 - 10.5 (K/uL)    RBC 4.51  4.22 - 5.81 (MIL/uL)    Hemoglobin 14.1  13.0 - 17.0 (g/dL)    HCT 09.6  04.5 - 40.9 (%)    MCV 87.8  78.0 - 100.0 (fL)    MCH 31.3  26.0 - 34.0 (pg)    MCHC 35.6  30.0 - 36.0 (g/dL)    RDW 81.1  91.4 - 78.2 (%)    Platelets 181  150 - 400 (K/uL)   GLUCOSE,  CAPILLARY     Status: Abnormal   Collection Time   11/08/10  7:43 AM      Component Value Range Comment   Glucose-Capillary 233 (*) 70 - 99 (mg/dL)    Comment 1 Notify RN      Comment 2 Documented in Chart      Recent Results (from the past 240 hour(s))  MRSA PCR SCREENING     Status: Normal   Collection Time   11/07/10 11:44 PM      Component Value Range Status Comment   MRSA by PCR NEGATIVE  NEGATIVE  Final     Dg Chest 2 View  11/07/2010  *RADIOLOGY REPORT*  Clinical Data: Left-sided weakness  CHEST - 2 VIEW  Comparison: 01/01/2009  Findings: Cardiomediastinal silhouette is stable.  No acute infiltrate or pleural effusion.  No pulmonary edema.  Bony structures are stable.  IMPRESSION: No active disease.  No significant change.  Original Report Authenticated By: Natasha Mead, M.D.   Ct Head Wo Contrast  11/07/2010  *RADIOLOGY REPORT*  Clinical Data: Left-sided numbness and weakness  CT HEAD WITHOUT CONTRAST  Technique:  Contiguous axial images were obtained from the base of the skull through the vertex without contrast.  Comparison: None.  Findings: Wallace Cullens white differentiation is maintained.  No CT evidence of acute large territory infarct.  No intraparenchymal or extra- axial mass or hemorrhage.  Normal size and configuration of the ventricles and basilar cisterns.  No midline shift.  Polypoid mucosal thickening within the left maxillary sinus.  Remaining paranasal sinuses and mastoid air cells are normal.  No air fluid levels.  The regional soft tissues are normal.  No calvarial fracture.  IMPRESSION:  Polypoid left maxillary sinus disease.  Otherwise normal noncontrast head CT.  Original Report Authenticated By: Waynard Reeds, M.D.      Medications: I have reviewed the patient's current medications.  Impression: 1. Transient ischemic attack affecting the right brain. 2. Hypertensive emergency. 3. Type 2 diabetes mellitus. 4. Depression. 5. Polysubstance abuse with evidence of  marijuana on this admission. There is no cocaine in his urine drug screen on this admission.  Plan: 1. Wean and discontinue nitroglycerin drip. 2. Increase clonidine. 3. Carotid Dopplers.     LOS: 1 day   GOSRANI,NIMISH C 11/08/2010, 8:06 AM

## 2010-11-09 LAB — COMPREHENSIVE METABOLIC PANEL
ALT: 17 U/L (ref 0–53)
AST: 15 U/L (ref 0–37)
Albumin: 3.2 g/dL — ABNORMAL LOW (ref 3.5–5.2)
CO2: 27 mEq/L (ref 19–32)
Calcium: 9 mg/dL (ref 8.4–10.5)
GFR calc non Af Amer: 70 mL/min — ABNORMAL LOW (ref >90)
Sodium: 135 mEq/L (ref 135–145)

## 2010-11-09 LAB — GLUCOSE, CAPILLARY: Glucose-Capillary: 241 mg/dL — ABNORMAL HIGH (ref 70–99)

## 2010-11-09 LAB — URINE CULTURE
Colony Count: NO GROWTH
Culture  Setup Time: 201210042031
Culture: NO GROWTH

## 2010-11-09 MED ORDER — INSULIN GLARGINE 100 UNIT/ML ~~LOC~~ SOLN: 20.0000 [IU] | Freq: Every day | SUBCUTANEOUS | Status: DC

## 2010-11-09 MED ORDER — CLONIDINE HCL 0.3 MG PO TABS: 0.3000 mg | ORAL_TABLET | Freq: Two times a day (BID) | ORAL | Status: DC

## 2010-11-09 MED ORDER — NICOTINE 14 MG/24HR TD PT24: 1.0000 | MEDICATED_PATCH | Freq: Every day | TRANSDERMAL | Status: AC

## 2010-11-09 MED ORDER — ASPIRIN 81 MG PO TBEC: 81.0000 mg | DELAYED_RELEASE_TABLET | Freq: Every day | ORAL | Status: AC

## 2010-11-09 MED ORDER — AMLODIPINE BESYLATE 5 MG PO TABS: 5.0000 mg | ORAL_TABLET | Freq: Every day | ORAL | Status: DC

## 2010-11-09 MED ORDER — LISINOPRIL 20 MG PO TABS: 20.0000 mg | ORAL_TABLET | Freq: Two times a day (BID) | ORAL | Status: DC

## 2010-11-09 NOTE — Progress Notes (Signed)
Gave patient discharge instructions and [facility] Nurse notes on Norvasc, Clonidine, Lantus, Lisinopril, and Nicotine patch.  Patient verbalizes understanding of all material.  Pt has indicated that he is on Medicaid but does not have the $3 for each of the prescriptions that he has been given.  Case management has talked with pt and since he is a resident of [facility] we are unable to assist him with medication vouchers.  Have talked with pt and stressed the importance of him trying to get the money ($18) to pay for the medications that he needs.  Also reinforced the fact that if he does not take the medication as prescribed then he is at increased risk of having a stroke, heart attack, or worse.  He verbalized that he understands and will talk to his family to see if he can get the medicine needed.  Pt taken down to the ER waiting room so that he can get a cab (that was called for and paid for by the hospital) to take him home.

## 2010-11-09 NOTE — Consult Note (Signed)
CSW arranged transport home via [employer] as pt reports no ride or money for transportation.  Pt d/c today. CSW to sign off.  Karn Cassis

## 2010-11-09 NOTE — Discharge Summary (Signed)
Physician Discharge Summary  Patient ID: [name] [name] MRN: [id number] DOB/AGE: [birth year] 50 y.o.  Admit date: 11/07/2010 Discharge date: 11/09/2010    Discharge Diagnoses:  1. Transient ischemic attack affecting right brain. 2. Uncontrolled hypertension. 3. Uncontrolled diabetes mellitus type 2. 4. Polysubstance abuse involving cocaine and marijuana.   Current Discharge Medication List    START taking these medications   Details  amLODipine (NORVASC) 5 MG tablet Take 1 tablet (5 mg total) by mouth daily. Qty: 30 tablet, Refills: 0    aspirin EC 81 MG EC tablet Take 1 tablet (81 mg total) by mouth daily. Qty: 30 tablet, Refills: 0    cloNIDine (CATAPRES) 0.3 MG tablet Take 1 tablet (0.3 mg total) by mouth 2 (two) times daily. Qty: 60 tablet, Refills: 0    insulin glargine (LANTUS) 100 UNIT/ML injection Inject 20 Units into the skin at bedtime. Qty: 10 mL, Refills: 2    lisinopril (PRINIVIL,ZESTRIL) 20 MG tablet Take 1 tablet (20 mg total) by mouth 2 (two) times daily. Qty: 60 tablet, Refills: 0    nicotine (NICODERM CQ - DOSED IN MG/24 HOURS) 14 mg/24hr patch Place 1 patch onto the skin daily. Qty: 28 patch, Refills: 0      STOP taking these medications     naproxen sodium (ALL DAY PAIN RELIEF) 220 MG tablet         Discharged Condition: Improved and stable.    Consults: None.  Significant Diagnostic Studies: Dg Chest 2 View  11/07/2010  *RADIOLOGY REPORT*  Clinical Data: Left-sided weakness  CHEST - 2 VIEW  Comparison: 01/01/2009  Findings: Cardiomediastinal silhouette is stable.  No acute infiltrate or pleural effusion.  No pulmonary edema.  Bony structures are stable.  IMPRESSION: No active disease.  No significant change.  Original Report Authenticated By: Natasha Mead, M.D.   Ct Head Wo Contrast  11/07/2010  *RADIOLOGY REPORT*  Clinical Data: Left-sided numbness and weakness  CT HEAD WITHOUT CONTRAST  Technique:  Contiguous axial images were obtained from the  base of the skull through the vertex without contrast.  Comparison: None.  Findings: Wallace Cullens white differentiation is maintained.  No CT evidence of acute large territory infarct.  No intraparenchymal or extra- axial mass or hemorrhage.  Normal size and configuration of the ventricles and basilar cisterns.  No midline shift.  Polypoid mucosal thickening within the left maxillary sinus.  Remaining paranasal sinuses and mastoid air cells are normal.  No air fluid levels.  The regional soft tissues are normal.  No calvarial fracture.  IMPRESSION:  Polypoid left maxillary sinus disease.  Otherwise normal noncontrast head CT.  Original Report Authenticated By: Waynard Reeds, M.D.   US Carotid Duplex Bilateral  11/08/2010  *RADIOLOGY REPORT*  Clinical Data: Hypertension, diabetes, smoker, TIA  BILATERAL CAROTID DUPLEX ULTRASOUND  Technique: Gray scale imaging, color Doppler and duplex ultrasound was performed of bilateral carotid and vertebral arteries in the neck.  Comparison:  None.  Criteria:  Quantification of carotid stenosis is based on velocity parameters that correlate the residual internal carotid diameter with NASCET-based stenosis levels, using the diameter of the distal internal carotid lumen as the denominator for stenosis measurement.  The following velocity measurements were obtained:                   PEAK SYSTOLIC/END DIASTOLIC RIGHT ICA:                        82/41cm/sec CCA:  84/14cm/sec SYSTOLIC ICA/CCA RATIO:     0.98 DIASTOLIC ICA/CCA RATIO:    2.96 ECA:                        133cm/sec  LEFT ICA:                        51/21cm/sec CCA:                        69/14cm/sec SYSTOLIC ICA/CCA RATIO:     0.73 DIASTOLIC ICA/CCA RATIO:    1.53 ECA:                        69cm/sec  Findings:  RIGHT CAROTID ARTERY: Diffuse intimal thickening.  No significant atherosclerosis, hemodynamically significant ICA stenosis, velocity elevation, or turbulent flow.  RIGHT VERTEBRAL ARTERY:   Antegrade  LEFT CAROTID ARTERY: Intimal thickening without significant atherosclerosis.  No hemodynamically significant ICA stenosis, velocity elevation, or turbulent flow.  LEFT VERTEBRAL ARTERY:  Antegrade  IMPRESSION: No hemodynamically significant ICA stenosis.  Original Report Authenticated By: Judie Petit. Ruel Favors, M.D.    Lab Results: Results for orders placed during the hospital encounter of 11/07/10 (from the past 48 hour(s))  CBC     Status: Abnormal   Collection Time   11/07/10  7:11 PM      Component Value Range Comment   WBC 11.2 (*) 4.0 - 10.5 (K/uL)    RBC 4.85  4.22 - 5.81 (MIL/uL)    Hemoglobin 15.4  13.0 - 17.0 (g/dL)    HCT 60.4  54.0 - 98.1 (%)    MCV 87.4  78.0 - 100.0 (fL)    MCH 31.8  26.0 - 34.0 (pg)    MCHC 36.3 (*) 30.0 - 36.0 (g/dL)    RDW 19.1  47.8 - 29.5 (%)    Platelets 208  150 - 400 (K/uL)   DIFFERENTIAL     Status: Abnormal   Collection Time   11/07/10  7:11 PM      Component Value Range Comment   Neutrophils Relative 54  43 - 77 (%)    Neutro Abs 6.0  1.7 - 7.7 (K/uL)    Lymphocytes Relative 37  12 - 46 (%)    Lymphs Abs 4.1 (*) 0.7 - 4.0 (K/uL)    Monocytes Relative 6  3 - 12 (%)    Monocytes Absolute 0.7  0.1 - 1.0 (K/uL)    Eosinophils Relative 3  0 - 5 (%)    Eosinophils Absolute 0.4  0.0 - 0.7 (K/uL)    Basophils Relative 0  0 - 1 (%)    Basophils Absolute 0.0  0.0 - 0.1 (K/uL)   COMPREHENSIVE METABOLIC PANEL     Status: Abnormal   Collection Time   11/07/10  7:11 PM      Component Value Range Comment   Sodium 134 (*) 135 - 145 (mEq/L)    Potassium 3.7  3.5 - 5.1 (mEq/L)    Chloride 101  96 - 112 (mEq/L)    CO2 24  19 - 32 (mEq/L)    Glucose, Bld 210 (*) 70 - 99 (mg/dL)    BUN 20  6 - 23 (mg/dL)    Creatinine, Ser 6.21  0.50 - 1.35 (mg/dL)    Calcium 9.3  8.4 - 10.5 (mg/dL)    Total Protein 6.6  6.0 - 8.3 (g/dL)  Albumin 3.5  3.5 - 5.2 (g/dL)    AST 17  0 - 37 (U/L)    ALT 18  0 - 53 (U/L)    Alkaline Phosphatase 80  39 - 117 (U/L)     Total Bilirubin 0.4  0.3 - 1.2 (mg/dL)    GFR calc non Af Amer 73 (*) >90 (mL/min)    GFR calc Af Amer 84 (*) >90 (mL/min)   PRO B NATRIURETIC PEPTIDE     Status: Abnormal   Collection Time   11/07/10  7:11 PM      Component Value Range Comment   BNP, POC 266.7 (*) 0 - 125 (pg/mL)   POCT I-STAT TROPONIN I     Status: Normal   Collection Time   11/07/10  7:36 PM      Component Value Range Comment   Troponin i, poc 0.00  0.00 - 0.08 (ng/mL)    Comment 3            MRSA PCR SCREENING     Status: Normal   Collection Time   11/07/10 11:44 PM      Component Value Range Comment   MRSA by PCR NEGATIVE  NEGATIVE    APTT     Status: Normal   Collection Time   11/08/10 12:04 AM      Component Value Range Comment   aPTT 31  24 - 37 (seconds)   PROTIME-INR     Status: Normal   Collection Time   11/08/10 12:04 AM      Component Value Range Comment   Prothrombin Time 13.5  11.6 - 15.2 (seconds)    INR 1.01  0.00 - 1.49    TSH     Status: Normal   Collection Time   11/08/10 12:04 AM      Component Value Range Comment   TSH 1.034     URINALYSIS, ROUTINE W REFLEX MICROSCOPIC     Status: Abnormal   Collection Time   11/08/10  4:37 AM      Component Value Range Comment   Color, Urine AMBER (*) YELLOW  BIOCHEMICALS MAY BE AFFECTED BY COLOR   Appearance CLEAR  CLEAR     Specific Gravity, Urine 1.025  1.005 - 1.030     pH 6.0  5.0 - 8.0     Glucose, UA >1000 (*) NEGATIVE (mg/dL)    Hgb urine dipstick NEGATIVE  NEGATIVE     Bilirubin Urine NEGATIVE  NEGATIVE     Ketones, ur NEGATIVE  NEGATIVE (mg/dL)    Protein, ur NEGATIVE  NEGATIVE (mg/dL)    Urobilinogen, UA 1.0  0.0 - 1.0 (mg/dL)    Nitrite NEGATIVE  NEGATIVE     Leukocytes, UA NEGATIVE  NEGATIVE    URINE RAPID DRUG SCREEN (HOSP PERFORMED)     Status: Abnormal   Collection Time   11/08/10  4:37 AM      Component Value Range Comment   Opiates NONE DETECTED  NONE DETECTED     Cocaine NONE DETECTED  NONE DETECTED     Benzodiazepines NONE  DETECTED  NONE DETECTED     Amphetamines NONE DETECTED  NONE DETECTED     Tetrahydrocannabinol POSITIVE (*) NONE DETECTED     Barbiturates NONE DETECTED  NONE DETECTED    CARDIAC PANEL(CRET KIN+CKTOT+MB+TROPI)     Status: Abnormal   Collection Time   11/08/10  5:33 AM      Component Value Range Comment   Total CK 429 (*)  7 - 232 (U/L)    CK, MB 4.2 (*) 0.3 - 4.0 (ng/mL)    Troponin I <0.30  <0.30 (ng/mL)    Relative Index 1.0  0.0 - 2.5    BASIC METABOLIC PANEL     Status: Abnormal   Collection Time   11/08/10  5:33 AM      Component Value Range Comment   Sodium 137  135 - 145 (mEq/L)    Potassium 3.2 (*) 3.5 - 5.1 (mEq/L)    Chloride 101  96 - 112 (mEq/L)    CO2 28  19 - 32 (mEq/L)    Glucose, Bld 256 (*) 70 - 99 (mg/dL)    BUN 17  6 - 23 (mg/dL)    Creatinine, Ser 1.61  0.50 - 1.35 (mg/dL)    Calcium 8.8  8.4 - 10.5 (mg/dL)    GFR calc non Af Amer 63 (*) >90 (mL/min)    GFR calc Af Amer 73 (*) >90 (mL/min)   CBC     Status: Normal   Collection Time   11/08/10  5:33 AM      Component Value Range Comment   WBC 9.4  4.0 - 10.5 (K/uL)    RBC 4.51  4.22 - 5.81 (MIL/uL)    Hemoglobin 14.1  13.0 - 17.0 (g/dL)    HCT 09.6  04.5 - 40.9 (%)    MCV 87.8  78.0 - 100.0 (fL)    MCH 31.3  26.0 - 34.0 (pg)    MCHC 35.6  30.0 - 36.0 (g/dL)    RDW 81.1  91.4 - 78.2 (%)    Platelets 181  150 - 400 (K/uL)   HEMOGLOBIN A1C     Status: Abnormal   Collection Time   11/08/10  5:33 AM      Component Value Range Comment   Hemoglobin A1C 8.1 (*) <5.7 (%)    Mean Plasma Glucose 186 (*) <117 (mg/dL)   GLUCOSE, CAPILLARY     Status: Abnormal   Collection Time   11/08/10  7:43 AM      Component Value Range Comment   Glucose-Capillary 233 (*) 70 - 99 (mg/dL)    Comment 1 Notify RN      Comment 2 Documented in Chart     GLUCOSE, CAPILLARY     Status: Abnormal   Collection Time   11/08/10 11:29 AM      Component Value Range Comment   Glucose-Capillary 255 (*) 70 - 99 (mg/dL)    Comment 1 Notify RN       Comment 2 Documented in Chart     CARDIAC PANEL(CRET KIN+CKTOT+MB+TROPI)     Status: Abnormal   Collection Time   11/08/10  1:02 PM      Component Value Range Comment   Total CK 395 (*) 7 - 232 (U/L)    CK, MB 4.2 (*) 0.3 - 4.0 (ng/mL)    Troponin I <0.30  <0.30 (ng/mL)    Relative Index 1.1  0.0 - 2.5    GLUCOSE, CAPILLARY     Status: Abnormal   Collection Time   11/08/10  4:11 PM      Component Value Range Comment   Glucose-Capillary 227 (*) 70 - 99 (mg/dL)    Comment 1 Notify RN      Comment 2 Documented in Chart     GLUCOSE, CAPILLARY     Status: Abnormal   Collection Time   11/08/10  9:16 PM  Component Value Range Comment   Glucose-Capillary 118 (*) 70 - 99 (mg/dL)    Comment 1 Repeat Test     GLUCOSE, CAPILLARY     Status: Abnormal   Collection Time   11/08/10  9:19 PM      Component Value Range Comment   Glucose-Capillary 281 (*) 70 - 99 (mg/dL)    Comment 1 Documented in Chart      Comment 2 Notify RN     COMPREHENSIVE METABOLIC PANEL     Status: Abnormal   Collection Time   11/09/10  4:36 AM      Component Value Range Comment   Sodium 135  135 - 145 (mEq/L)    Potassium 4.1  3.5 - 5.1 (mEq/L) DELTA CHECK NOTED   Chloride 101  96 - 112 (mEq/L)    CO2 27  19 - 32 (mEq/L)    Glucose, Bld 252 (*) 70 - 99 (mg/dL)    BUN 13  6 - 23 (mg/dL)    Creatinine, Ser 0.45  0.50 - 1.35 (mg/dL)    Calcium 9.0  8.4 - 10.5 (mg/dL)    Total Protein 6.3  6.0 - 8.3 (g/dL)    Albumin 3.2 (*) 3.5 - 5.2 (g/dL)    AST 15  0 - 37 (U/L)    ALT 17  0 - 53 (U/L)    Alkaline Phosphatase 68  39 - 117 (U/L)    Total Bilirubin 0.5  0.3 - 1.2 (mg/dL)    GFR calc non Af Amer 70 (*) >90 (mL/min)    GFR calc Af Amer 82 (*) >90 (mL/min)   GLUCOSE, CAPILLARY     Status: Abnormal   Collection Time   11/09/10  7:56 AM      Component Value Range Comment   Glucose-Capillary 241 (*) 70 - 99 (mg/dL)    Comment 1 Notify RN      Comment 2 Documented in Chart      Recent Results (from the past  240 hour(s))  MRSA PCR SCREENING     Status: Normal   Collection Time   11/07/10 11:44 PM      Component Value Range Status Comment   MRSA by PCR NEGATIVE  NEGATIVE  Final      Hospital Course: This 50 year old man, who has been largely noncompliant due to financial difficulties, presented with weakness and numbness of the left arm and left leg. Please see initial history and physical examination done by Dr. Vania Rea. When he presented he was also found to be significantly hypertensive and had uncontrolled diabetes. This is not surprising as he has not been taking any medications for these conditions. He was admitted to the intensive care unit and monitored. Fortunately, his symptoms improved significantly in less than 24 hours. He CT brain scan was unremarkable, not showing any acute stroke. His blood pressure was better controlled with antihypertensives and also he was started on Lantus insulin which improved sugar control. Today he is feeling well with no deficits whatsoever. Bilateral carotid Dopplers were done and these were negative for any significant stenosis.   Discharge Exam: Blood pressure 116/95, pulse 64, temperature 98.9 F (37.2 C), temperature source Oral, resp. rate 13, height 5\' 11"  (1.803 m), weight 124.2 kg (273 lb 13 oz), SpO2 99.00%. He looks systemically well. There are no cranial nerve abnormalities. His speech is normal. He is alert and orientated. There is no weakness whatsoever and is upper or lower limbs, in particular there is no  weakness in the left arm and leg whatsoever. There are no cerebellar signs. Cardiovascular: Heart sounds are present and normal with no murmurs. He is in sinus rhythm. Lung fields are clear.   Disposition: Home. He will need to follow with his primary care physician who is in [facility] within the week or so. He does have financial difficulties although he does have Medicaid. We will try and furnish him with some of his medications  until he is able to afford them. His lipid panel was not checked on this admission but will need to be clearly checked and he may need a statin also.   Discharge Orders    Future Orders Please Complete By Expires   Diet - low sodium heart healthy      Increase activity slowly      Discharge instructions      Comments:   Please see your family doctor within 1 week        Signed: GOSRANI,NIMISH C 11/09/2010, 8:24 AM

## 2011-08-05 HISTORY — PX: NM MYOVIEW LTD: HXRAD82

## 2011-08-31 ENCOUNTER — Inpatient Hospital Stay: Payer: Self-pay | Admitting: Internal Medicine

## 2011-08-31 LAB — DRUG SCREEN, URINE
Barbiturates, Ur Screen: NEGATIVE (ref ?–200)
Benzodiazepine, Ur Scrn: NEGATIVE (ref ?–200)
Cannabinoid 50 Ng, Ur ~~LOC~~: NEGATIVE (ref ?–50)
MDMA (Ecstasy)Ur Screen: NEGATIVE (ref ?–500)
Methadone, Ur Screen: NEGATIVE (ref ?–300)
Phencyclidine (PCP) Ur S: NEGATIVE (ref ?–25)

## 2011-08-31 LAB — CBC
HGB: 14.4 g/dL (ref 13.0–18.0)
MCHC: 34 g/dL (ref 32.0–36.0)
MCV: 88 fL (ref 80–100)
Platelet: 215 10*3/uL (ref 150–440)
RBC: 4.79 10*6/uL (ref 4.40–5.90)

## 2011-08-31 LAB — CK TOTAL AND CKMB (NOT AT ARMC)
CK, Total: 334 U/L — ABNORMAL HIGH (ref 35–232)
CK-MB: 2.8 ng/mL (ref 0.5–3.6)

## 2011-08-31 LAB — ETHANOL
Ethanol %: <0.003 % (ref 0.000–0.080)
Ethanol: <3 mg/dL

## 2011-08-31 LAB — BASIC METABOLIC PANEL
BUN: 19 mg/dL — ABNORMAL HIGH (ref 7–18)
Calcium, Total: 8.6 mg/dL (ref 8.5–10.1)
Chloride: 103 mmol/L (ref 98–107)
Co2: 23 mmol/L (ref 21–32)
Creatinine: 1.23 mg/dL (ref 0.60–1.30)
Osmolality: 292 (ref 275–301)
Potassium: 3.7 mmol/L (ref 3.5–5.1)

## 2011-09-01 LAB — COMPREHENSIVE METABOLIC PANEL
Alkaline Phosphatase: 77 U/L (ref 50–136)
Anion Gap: 7 (ref 7–16)
Bilirubin,Total: 0.3 mg/dL (ref 0.2–1.0)
Chloride: 105 mmol/L (ref 98–107)
Co2: 27 mmol/L (ref 21–32)
Creatinine: 1.07 mg/dL (ref 0.60–1.30)
EGFR (African American): >60
EGFR (Non-African Amer.): >60
Glucose: 117 mg/dL — ABNORMAL HIGH (ref 65–99)
Osmolality: 282 (ref 275–301)
Potassium: 3.7 mmol/L (ref 3.5–5.1)
Sodium: 139 mmol/L (ref 136–145)

## 2011-09-01 LAB — CBC WITH DIFFERENTIAL/PLATELET
Basophil %: 0.5 %
Eosinophil %: 3.7 %
HGB: 14 g/dL (ref 13.0–18.0)
Lymphocyte #: 3.2 10*3/uL (ref 1.0–3.6)
Lymphocyte %: 32.3 %
MCV: 87 fL (ref 80–100)
Monocyte #: 1.1 x10 3/mm — ABNORMAL HIGH (ref 0.2–1.0)
Monocyte %: 11 %
Neutrophil %: 52.5 %
Platelet: 222 10*3/uL (ref 150–440)
RBC: 4.74 10*6/uL (ref 4.40–5.90)
RDW: 14.7 % — ABNORMAL HIGH (ref 11.5–14.5)
WBC: 9.9 10*3/uL (ref 3.8–10.6)

## 2011-09-01 LAB — MAGNESIUM: Magnesium: 1.7 mg/dL — ABNORMAL LOW

## 2011-09-01 LAB — TROPONIN I: Troponin-I: 0.05 ng/mL

## 2011-09-01 LAB — CK TOTAL AND CKMB (NOT AT ARMC)
CK-MB: 2.6 ng/mL (ref 0.5–3.6)
CK-MB: 2.6 ng/mL (ref 0.5–3.6)

## 2011-09-02 DIAGNOSIS — R079 Chest pain, unspecified: Secondary | ICD-10-CM

## 2011-12-15 ENCOUNTER — Encounter (HOSPITAL_COMMUNITY): Payer: Self-pay | Admitting: Emergency Medicine

## 2011-12-15 ENCOUNTER — Emergency Department (HOSPITAL_COMMUNITY): Payer: Medicaid Other

## 2011-12-15 ENCOUNTER — Emergency Department (HOSPITAL_COMMUNITY)
Admission: EM | Admit: 2011-12-15 | Discharge: 2011-12-15 | Disposition: A | Discharge status: Home / Self Care | Payer: Medicaid Other | Attending: Emergency Medicine | Admitting: Emergency Medicine

## 2011-12-15 DIAGNOSIS — Z79899 Other long term (current) drug therapy: Secondary | ICD-10-CM | POA: Insufficient documentation

## 2011-12-15 DIAGNOSIS — Z9114 Patient's other noncompliance with medication regimen: Secondary | ICD-10-CM

## 2011-12-15 DIAGNOSIS — F3289 Other specified depressive episodes: Secondary | ICD-10-CM | POA: Insufficient documentation

## 2011-12-15 DIAGNOSIS — F419 Anxiety disorder, unspecified: Secondary | ICD-10-CM

## 2011-12-15 DIAGNOSIS — F329 Major depressive disorder, single episode, unspecified: Secondary | ICD-10-CM | POA: Insufficient documentation

## 2011-12-15 DIAGNOSIS — F411 Generalized anxiety disorder: Secondary | ICD-10-CM | POA: Insufficient documentation

## 2011-12-15 DIAGNOSIS — Z794 Long term (current) use of insulin: Secondary | ICD-10-CM | POA: Insufficient documentation

## 2011-12-15 DIAGNOSIS — Z8679 Personal history of other diseases of the circulatory system: Secondary | ICD-10-CM | POA: Insufficient documentation

## 2011-12-15 DIAGNOSIS — Z760 Encounter for issue of repeat prescription: Secondary | ICD-10-CM | POA: Insufficient documentation

## 2011-12-15 DIAGNOSIS — I10 Essential (primary) hypertension: Secondary | ICD-10-CM | POA: Insufficient documentation

## 2011-12-15 DIAGNOSIS — E119 Type 2 diabetes mellitus without complications: Secondary | ICD-10-CM | POA: Insufficient documentation

## 2011-12-15 DIAGNOSIS — K219 Gastro-esophageal reflux disease without esophagitis: Secondary | ICD-10-CM | POA: Insufficient documentation

## 2011-12-15 DIAGNOSIS — F17200 Nicotine dependence, unspecified, uncomplicated: Secondary | ICD-10-CM | POA: Insufficient documentation

## 2011-12-15 HISTORY — DX: Major depressive disorder, single episode, unspecified: F32.9

## 2011-12-15 HISTORY — DX: Other psychoactive substance abuse, uncomplicated: F19.10

## 2011-12-15 HISTORY — DX: Auditory hallucinations: R44.0

## 2011-12-15 HISTORY — DX: Personal history of suicidal behavior: Z91.51

## 2011-12-15 HISTORY — DX: Personal history of self-harm: Z91.5

## 2011-12-15 HISTORY — DX: Depression, unspecified: F32.A

## 2011-12-15 HISTORY — DX: Patient's other noncompliance with medication regimen: Z91.14

## 2011-12-15 HISTORY — DX: Patient's other noncompliance with medication regimen for other reason: Z91.148

## 2011-12-15 LAB — CBC WITH DIFFERENTIAL/PLATELET
Basophils Absolute: 0 10*3/uL (ref 0.0–0.1)
HCT: 42.7 % (ref 39.0–52.0)
Lymphocytes Relative: 26 % (ref 12–46)
Lymphs Abs: 2.6 10*3/uL (ref 0.7–4.0)
Monocytes Absolute: 0.8 10*3/uL (ref 0.1–1.0)
Neutro Abs: 6.3 10*3/uL (ref 1.7–7.7)
Platelets: 196 10*3/uL (ref 150–400)
RBC: 5.04 MIL/uL (ref 4.22–5.81)
RDW: 12.4 % (ref 11.5–15.5)
WBC: 10 10*3/uL (ref 4.0–10.5)

## 2011-12-15 LAB — BASIC METABOLIC PANEL
CO2: 27 mEq/L (ref 19–32)
Chloride: 95 mEq/L — ABNORMAL LOW (ref 96–112)
Sodium: 131 mEq/L — ABNORMAL LOW (ref 135–145)

## 2011-12-15 LAB — URINALYSIS, ROUTINE W REFLEX MICROSCOPIC
Leukocytes, UA: NEGATIVE
Nitrite: NEGATIVE
Specific Gravity, Urine: 1.015 (ref 1.005–1.030)
pH: 6.5 (ref 5.0–8.0)

## 2011-12-15 LAB — RAPID URINE DRUG SCREEN, HOSP PERFORMED
Benzodiazepines: NOT DETECTED
Cocaine: NOT DETECTED
Opiates: NOT DETECTED

## 2011-12-15 LAB — GLUCOSE, CAPILLARY: Glucose-Capillary: 292 mg/dL — ABNORMAL HIGH (ref 70–99)

## 2011-12-15 LAB — ETHANOL: Alcohol, Ethyl (B): <11 mg/dL (ref 0–11)

## 2011-12-15 MED ORDER — INSULIN GLARGINE 100 UNIT/ML ~~LOC~~ SOLN: 20.0000 [IU] | Freq: Every day | SUBCUTANEOUS | Status: DC

## 2011-12-15 MED ORDER — INSULIN GLARGINE 100 UNIT/ML ~~LOC~~ SOLN
20.0000 [IU] | Freq: Once | SUBCUTANEOUS | Status: AC
  Administered 2011-12-15: 09:00:00 via SUBCUTANEOUS
  Filled 2011-12-15: qty 1

## 2011-12-15 MED ORDER — LORAZEPAM 1 MG PO TABS: ORAL_TABLET | ORAL | Status: DC

## 2011-12-15 MED ORDER — AMLODIPINE BESYLATE 5 MG PO TABS: 5.0000 mg | ORAL_TABLET | Freq: Every day | ORAL | Status: DC

## 2011-12-15 MED ORDER — INSULIN ASPART 100 UNIT/ML ~~LOC~~ SOLN
5.0000 [IU] | Freq: Once | SUBCUTANEOUS | Status: AC
  Administered 2011-12-15: 09:00:00 via SUBCUTANEOUS
  Filled 2011-12-15: qty 1

## 2011-12-15 MED ORDER — AMLODIPINE BESYLATE 5 MG PO TABS
5.0000 mg | ORAL_TABLET | Freq: Once | ORAL | Status: AC
  Administered 2011-12-15: 5 mg via ORAL
  Filled 2011-12-15: qty 1

## 2011-12-15 MED ORDER — LISINOPRIL 10 MG PO TABS
20.0000 mg | ORAL_TABLET | Freq: Once | ORAL | Status: AC
  Administered 2011-12-15: 20 mg via ORAL
  Filled 2011-12-15: qty 2

## 2011-12-15 MED ORDER — CLONIDINE HCL 0.2 MG PO TABS
0.3000 mg | ORAL_TABLET | Freq: Once | ORAL | Status: AC
  Administered 2011-12-15: 0.3 mg via ORAL
  Filled 2011-12-15: qty 1

## 2011-12-15 MED ORDER — CLONIDINE HCL 0.2 MG PO TABS: 0.3000 mg | ORAL_TABLET | Freq: Two times a day (BID) | ORAL | Status: DC

## 2011-12-15 MED ORDER — LISINOPRIL 20 MG PO TABS: 20.0000 mg | ORAL_TABLET | Freq: Two times a day (BID) | ORAL | Status: DC

## 2011-12-15 NOTE — ED Provider Notes (Signed)
History     CSN: [id number]  Arrival date & time 12/15/11  4098   First MD Initiated Contact with Patient 12/15/11 0730      Chief Complaint  Patient presents with  . V70.1    HPI Pt was seen at 0735.  Per pt, c/o gradual onset and persistence of constant inability to sleep well for the past 6 months worse over the past 3 months.  Describes his symptoms as having racing thoughts at night and getting "panicked."  States these symptoms keep him awake and he has to get up to walk around. Pt states his symptoms intermittently improve "then get worse again."  Endorses he continues to have chronic auditory hallucinations "like a radio being tuned in to various stations" which worsens to "voices" he can understand when he "gets more anxious."  Pt states he has been out of his psych meds "for awhile," and also has run out of his BP meds and insulin 3 days ago.  Denies SI, no SA, no HI.  Denies CP/SOB, no abd pain, no back pain, no N/V/D, no visual changes, no focal motor weakness, no tingling/numbness in extremities.    Past Medical History  Diagnosis Date  . Hypertension   . Diabetes mellitus   . GERD (gastroesophageal reflux disease)   . Pericarditis   . Polysubstance abuse     cocaine, marijuana  . Auditory hallucinations   . Depression   . Hx of medication noncompliance   . History of suicide attempt     Past Surgical History  Procedure Date  . Pericardial fluid drainage     Family History  Problem Relation Age of Onset  . Stroke Mother   . Diabetes type II Mother   . Hypertension Father   . Arthritis Father   . Arthritis Sister   . Diabetes type II Sister   . Hypertension Sister     History  Substance Use Topics  . Smoking status: Current Every Day Smoker -- 0.3 packs/day  . Smokeless tobacco: Not on file  . Alcohol Use: Yes     Comment: occasionally; reports 1 pint of vodka per month at payday.      Review of Systems ROS: Statement: All systems negative except  as marked or noted in the HPI; Constitutional: Negative for fever and chills. ; ; Eyes: Negative for eye pain, redness and discharge. ; ; ENMT: Negative for ear pain, hoarseness, nasal congestion, sinus pressure and sore throat. ; ; Cardiovascular: Negative for chest pain, palpitations, diaphoresis, dyspnea and peripheral edema. ; ; Respiratory: Negative for cough, wheezing and stridor. ; ; Gastrointestinal: Negative for nausea, vomiting, diarrhea, abdominal pain, blood in stool, hematemesis, jaundice and rectal bleeding. . ; ; Genitourinary: Negative for dysuria, flank pain and hematuria. ; ; Musculoskeletal: Negative for back pain and neck pain. Negative for swelling and trauma.; ; Skin: Negative for pruritus, rash, abrasions, blisters, bruising and skin lesion.; ; Neuro: Negative for headache, lightheadedness and neck stiffness. Negative for weakness, altered level of consciousness , altered mental status, extremity weakness, paresthesias, involuntary movement, seizure and syncope.; Psych:  +anxious, panic attacks. No SI, no SA, no HI, no hallucinations.      Allergies  Review of patient's allergies indicates no known allergies.  Home Medications   Current Outpatient Rx  Name  Route  Sig  Dispense  Refill  . AMLODIPINE BESYLATE 5 MG PO TABS   Oral   Take 1 tablet (5 mg total) by mouth daily.  30 tablet   0   . CLONIDINE HCL 0.3 MG PO TABS   Oral   Take 1 tablet (0.3 mg total) by mouth 2 (two) times daily.   60 tablet   0   . INSULIN GLARGINE 100 UNIT/ML [facility] SOLN   Subcutaneous   Inject 20 Units into the skin at bedtime.   10 mL   2   . LISINOPRIL 20 MG PO TABS   Oral   Take 1 tablet (20 mg total) by mouth 2 (two) times daily.   60 tablet   0     BP 211/141  Pulse 87  Temp 97.8 F (36.6 C) (Oral)  Resp 17  Ht 5\' 11"  (1.803 m)  Wt 265 lb (120.203 kg)  BMI 36.96 kg/m2  SpO2 97% BP 150/101  Pulse 67  Temp 97.2 F (36.2 C) (Oral)  Resp 20  Ht 5\' 11"  (1.803 m)  Wt 265  lb (120.203 kg)  BMI 36.96 kg/m2  SpO2 96%   Physical Exam 0740: Physical examination:  Nursing notes reviewed; Vital signs and O2 SAT reviewed;  Constitutional: Well developed, Well nourished, Well hydrated, In no acute distress; Head:  Normocephalic, atraumatic; Eyes: EOMI, PERRL, No scleral icterus; ENMT: Mouth and pharynx normal, Mucous membranes moist; Neck: Supple, Full range of motion, No lymphadenopathy; Cardiovascular: Regular rate and rhythm, No murmur, rub, or gallop; Respiratory: Breath sounds clear & equal bilaterally, No rales, rhonchi, wheezes.  Speaking full sentences with ease, Normal respiratory effort/excursion; Chest: Nontender, Movement normal; Abdomen: Soft, Nontender, Nondistended, Normal bowel sounds; Genitourinary: No CVA tenderness; Extremities: Pulses normal, No tenderness, No edema, No calf edema or asymmetry.; Neuro: AA&Ox3, Major CN grossly intact.  Speech clear. Climbs on and off stretcher easily by himself.  Gait steady. No gross focal motor or sensory deficits in extremities.; Skin: Color normal, Warm, Dry.; Psych:  Anxious, denies SI, no hallucinations, no psychosis.    ED Course  Procedures    MDM  MDM Reviewed: previous chart, nursing note and vitals Interpretation: labs and x-ray     Results for orders placed during the hospital encounter of 12/15/11  CBC WITH DIFFERENTIAL      Component Value Range   WBC 10.0  4.0 - 10.5 K/uL   RBC 5.04  4.22 - 5.81 MIL/uL   Hemoglobin 15.6  13.0 - 17.0 g/dL   HCT 16.1  09.6 - 04.5 %   MCV 84.7  78.0 - 100.0 fL   MCH 31.0  26.0 - 34.0 pg   MCHC 36.5 (*) 30.0 - 36.0 g/dL   RDW 40.9  81.1 - 91.4 %   Platelets 196  150 - 400 K/uL   Neutrophils Relative 63  43 - 77 %   Neutro Abs 6.3  1.7 - 7.7 K/uL   Lymphocytes Relative 26  12 - 46 %   Lymphs Abs 2.6  0.7 - 4.0 K/uL   Monocytes Relative 8  3 - 12 %   Monocytes Absolute 0.8  0.1 - 1.0 K/uL   Eosinophils Relative 3  0 - 5 %   Eosinophils Absolute 0.3  0.0 -  0.7 K/uL   Basophils Relative 0  0 - 1 %   Basophils Absolute 0.0  0.0 - 0.1 K/uL  BASIC METABOLIC PANEL      Component Value Range   Sodium 131 (*) 135 - 145 mEq/L   Potassium 3.7  3.5 - 5.1 mEq/L   Chloride 95 (*) 96 - 112 mEq/L  CO2 27  19 - 32 mEq/L   Glucose, Bld 405 (*) 70 - 99 mg/dL   BUN 22  6 - 23 mg/dL   Creatinine, Ser 5.40  0.50 - 1.35 mg/dL   Calcium 9.5  8.4 - 98.1 mg/dL   GFR calc non Af Amer >90  >90 mL/min   GFR calc Af Amer >90  >90 mL/min  ETHANOL      Component Value Range   Alcohol, Ethyl (B) <11  0 - 11 mg/dL  URINALYSIS, ROUTINE W REFLEX MICROSCOPIC      Component Value Range   Color, Urine YELLOW  YELLOW   APPearance CLEAR  CLEAR   Specific Gravity, Urine 1.015  1.005 - 1.030   pH 6.5  5.0 - 8.0   Glucose, UA >1000 (*) NEGATIVE mg/dL   Hgb urine dipstick NEGATIVE  NEGATIVE   Bilirubin Urine NEGATIVE  NEGATIVE   Ketones, ur NEGATIVE  NEGATIVE mg/dL   Protein, ur NEGATIVE  NEGATIVE mg/dL   Urobilinogen, UA 0.2  0.0 - 1.0 mg/dL   Nitrite NEGATIVE  NEGATIVE   Leukocytes, UA NEGATIVE  NEGATIVE  URINE RAPID DRUG SCREEN (HOSP PERFORMED)      Component Value Range   Opiates NONE DETECTED  NONE DETECTED   Cocaine NONE DETECTED  NONE DETECTED   Benzodiazepines NONE DETECTED  NONE DETECTED   Amphetamines NONE DETECTED  NONE DETECTED   Tetrahydrocannabinol NONE DETECTED  NONE DETECTED   Barbiturates NONE DETECTED  NONE DETECTED  PRO B NATRIURETIC PEPTIDE      Component Value Range   Pro B Natriuretic peptide (BNP) 279.4 (*) 0 - 125 pg/mL  GLUCOSE, CAPILLARY      Component Value Range   Glucose-Capillary 350 (*) 70 - 99 mg/dL  URINE MICROSCOPIC-ADD ON      Component Value Range   RBC / HPF 0-2  <3 RBC/hpf   Dg Chest 2 View 12/15/2011  *RADIOLOGY REPORT*  Clinical Data: Shortness of breath.  CHEST - 2 VIEW  Comparison: 11/07/2010  Findings: Stable mild prominence of heart size and tortuosity of the thoracic aorta.  No edema, pulmonary consolidation,  nodule or pleural fluid is identified.  The bony thorax is unremarkable.  IMPRESSION: No active disease.  Stable mildly prominent heart size and ectasia of the aorta.   Original Report Authenticated By: Irish Lack, M.D.      [phone]:  Pt with known HTN and DM, non-compliant with his meds.  Not acidotic today, AG 9.  No CHF on CXR.  Pt given his usual BP meds as well as lantus insulin SQ.  Also given SQ dose of regular insulin.  Continues to deny CP/SOB.  Has been ambulatory around the exam room with steady gait, easy resps.  Will need ACT eval.   1330:  ACT Tammy Sours has eval:  Pt is not SI, not having auditory hallucinations at this time, is agreeable to f/u outpt.  Contracts for safety.  Will d/c home with outpt resources.  Pt requesting a refill of his meds, as well as "something for my nerves."  Strongly encouraged to establish with PMD for good continuity of care and control of his chronic medical conditions. Verb understanding.      Laray Anger, DO 12/18/11 1302

## 2011-12-15 NOTE — ED Notes (Signed)
Complain of a headache, left shoulder pain, unable to sleep and states " If I had a gun I would have shot myself ".
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
## 2011-12-15 NOTE — ED Notes (Signed)
[name] [name] from Act team called ED secretary and stated he was on his way to evaluate pt.

## 2012-01-08 ENCOUNTER — Encounter (HOSPITAL_COMMUNITY): Payer: Self-pay | Admitting: Emergency Medicine

## 2012-01-08 ENCOUNTER — Emergency Department (HOSPITAL_COMMUNITY)
Admission: EM | Admit: 2012-01-08 | Discharge: 2012-01-09 | Disposition: A | Discharge status: Other Acute Inpatient Hospital | Payer: Medicaid Other | Attending: Emergency Medicine | Admitting: Emergency Medicine

## 2012-01-08 DIAGNOSIS — Z79899 Other long term (current) drug therapy: Secondary | ICD-10-CM | POA: Insufficient documentation

## 2012-01-08 DIAGNOSIS — Z9119 Patient's noncompliance with other medical treatment and regimen: Secondary | ICD-10-CM | POA: Insufficient documentation

## 2012-01-08 DIAGNOSIS — F1910 Other psychoactive substance abuse, uncomplicated: Secondary | ICD-10-CM | POA: Insufficient documentation

## 2012-01-08 DIAGNOSIS — Z91199 Patient's noncompliance with other medical treatment and regimen due to unspecified reason: Secondary | ICD-10-CM | POA: Insufficient documentation

## 2012-01-08 DIAGNOSIS — F17200 Nicotine dependence, unspecified, uncomplicated: Secondary | ICD-10-CM | POA: Insufficient documentation

## 2012-01-08 DIAGNOSIS — F329 Major depressive disorder, single episode, unspecified: Secondary | ICD-10-CM

## 2012-01-08 DIAGNOSIS — F3289 Other specified depressive episodes: Secondary | ICD-10-CM | POA: Insufficient documentation

## 2012-01-08 DIAGNOSIS — Z794 Long term (current) use of insulin: Secondary | ICD-10-CM | POA: Insufficient documentation

## 2012-01-08 DIAGNOSIS — R443 Hallucinations, unspecified: Secondary | ICD-10-CM | POA: Insufficient documentation

## 2012-01-08 DIAGNOSIS — R45851 Suicidal ideations: Secondary | ICD-10-CM

## 2012-01-08 DIAGNOSIS — K219 Gastro-esophageal reflux disease without esophagitis: Secondary | ICD-10-CM | POA: Insufficient documentation

## 2012-01-08 DIAGNOSIS — I10 Essential (primary) hypertension: Secondary | ICD-10-CM | POA: Insufficient documentation

## 2012-01-08 DIAGNOSIS — R319 Hematuria, unspecified: Secondary | ICD-10-CM | POA: Insufficient documentation

## 2012-01-08 DIAGNOSIS — E119 Type 2 diabetes mellitus without complications: Secondary | ICD-10-CM | POA: Insufficient documentation

## 2012-01-08 DIAGNOSIS — I319 Disease of pericardium, unspecified: Secondary | ICD-10-CM | POA: Insufficient documentation

## 2012-01-08 LAB — CBC WITH DIFFERENTIAL/PLATELET
Eosinophils Absolute: 0.2 10*3/uL (ref 0.0–0.7)
Hemoglobin: 15.7 g/dL (ref 13.0–17.0)
Lymphs Abs: 2.8 10*3/uL (ref 0.7–4.0)
MCH: 31.3 pg (ref 26.0–34.0)
Monocytes Relative: 6 % (ref 3–12)
Neutro Abs: 6.8 10*3/uL (ref 1.7–7.7)
Neutrophils Relative %: 65 % (ref 43–77)
RBC: 5.01 MIL/uL (ref 4.22–5.81)

## 2012-01-08 LAB — URINALYSIS, ROUTINE W REFLEX MICROSCOPIC
Leukocytes, UA: NEGATIVE
Protein, ur: 100 mg/dL — AB
Urobilinogen, UA: 1 mg/dL (ref 0.0–1.0)

## 2012-01-08 LAB — RAPID URINE DRUG SCREEN, HOSP PERFORMED
Opiates: NOT DETECTED
Tetrahydrocannabinol: NOT DETECTED

## 2012-01-08 LAB — COMPREHENSIVE METABOLIC PANEL
ALT: 20 U/L (ref 0–53)
Alkaline Phosphatase: 81 U/L (ref 39–117)
CO2: 32 mEq/L (ref 19–32)
Chloride: 97 mEq/L (ref 96–112)
GFR calc Af Amer: 66 mL/min — ABNORMAL LOW (ref >90)
GFR calc non Af Amer: 57 mL/min — ABNORMAL LOW (ref >90)
Glucose, Bld: 288 mg/dL — ABNORMAL HIGH (ref 70–99)
Potassium: 3.8 mEq/L (ref 3.5–5.1)
Sodium: 136 mEq/L (ref 135–145)
Total Bilirubin: 0.6 mg/dL (ref 0.3–1.2)

## 2012-01-08 LAB — URINE MICROSCOPIC-ADD ON

## 2012-01-08 MED ORDER — LORAZEPAM 1 MG PO TABS
1.0000 mg | ORAL_TABLET | Freq: Once | ORAL | Status: AC
  Administered 2012-01-08: 1 mg via ORAL
  Filled 2012-01-08: qty 1

## 2012-01-08 MED ORDER — INSULIN ASPART 100 UNIT/ML ~~LOC~~ SOLN
5.0000 [IU] | Freq: Once | SUBCUTANEOUS | Status: AC
  Administered 2012-01-08: 5 [IU] via SUBCUTANEOUS
  Filled 2012-01-08: qty 1

## 2012-01-08 MED ORDER — PAROXETINE HCL 20 MG PO TABS
ORAL_TABLET | ORAL | Status: AC
  Filled 2012-01-08: qty 1

## 2012-01-08 MED ORDER — AMLODIPINE BESYLATE 5 MG PO TABS
5.0000 mg | ORAL_TABLET | Freq: Every day | ORAL | Status: DC
Start: 2012-01-08 — End: 2012-01-09
  Administered 2012-01-08 – 2012-01-09 (×2): 5 mg via ORAL
  Filled 2012-01-08 (×3): qty 1

## 2012-01-08 MED ORDER — LISINOPRIL 10 MG PO TABS
20.0000 mg | ORAL_TABLET | Freq: Two times a day (BID) | ORAL | Status: DC
  Administered 2012-01-08 – 2012-01-09 (×3): 20 mg via ORAL
  Filled 2012-01-08 (×2): qty 2
  Filled 2012-01-08 (×4): qty 1

## 2012-01-08 MED ORDER — INSULIN GLARGINE 100 UNIT/ML ~~LOC~~ SOLN
20.0000 [IU] | Freq: Every day | SUBCUTANEOUS | Status: DC
  Administered 2012-01-08: 20 [IU] via SUBCUTANEOUS
  Filled 2012-01-08: qty 1

## 2012-01-08 MED ORDER — AMLODIPINE BESYLATE 5 MG PO TABS
5.0000 mg | ORAL_TABLET | Freq: Every day | ORAL | Status: DC
  Administered 2012-01-08: 5 mg via ORAL

## 2012-01-08 MED ORDER — CLONIDINE HCL 0.2 MG PO TABS
0.3000 mg | ORAL_TABLET | Freq: Two times a day (BID) | ORAL | Status: DC
  Administered 2012-01-08 – 2012-01-09 (×3): 0.3 mg via ORAL
  Filled 2012-01-08 (×3): qty 1

## 2012-01-08 MED ORDER — LISINOPRIL 20 MG PO TABS: 20.0000 mg | ORAL_TABLET | Freq: Two times a day (BID) | ORAL | Status: DC

## 2012-01-08 MED ORDER — PAROXETINE HCL 20 MG PO TABS
20.0000 mg | ORAL_TABLET | Freq: Every day | ORAL | Status: DC
  Administered 2012-01-08 – 2012-01-09 (×2): 20 mg via ORAL
  Filled 2012-01-08 (×4): qty 1

## 2012-01-08 NOTE — ED Notes (Signed)
Patient was informed that a meal tray was ordered for him.

## 2012-01-08 NOTE — ED Notes (Signed)
Pt verbalized suicidal tendencies to Tommy with the act team and states the pt needs a sitter. Sitter at bedside. Pt placed in paper scrubs, wanded by security and moved to room 3. EDP, charge nurse and nursing supervisor notified.

## 2012-01-08 NOTE — ED Provider Notes (Signed)
History    CSN: [id number] Arrival date & time 01/08/12  0620 First MD Initiated Contact with Patient 01/08/12 [phone]    Chief Complaint  Patient presents with  . Panic Attack   HPI Pt presents with worsening anxiety. He has history of anxiety and panic attacks.  He had been seen in the ED approximately one month ago for similar symptoms.  Outpatient treatment has not been effective.  Pt also has history of cocaine abuse and has not been able to cease using cocaine.  He last use this morning.  He started to feel very anxious and jittery this am.  Since coming to the ED, he has started to feel better. Past Medical History  Diagnosis Date  . Hypertension   . Diabetes mellitus   . GERD (gastroesophageal reflux disease)   . Pericarditis   . Polysubstance abuse     cocaine, marijuana  . Auditory hallucinations   . Depression   . Hx of medication noncompliance   . History of suicide attempt     Past Surgical History  Procedure Date  . Pericardial fluid drainage     Family History  Problem Relation Age of Onset  . Stroke Mother   . Diabetes type II Mother   . Hypertension Father   . Arthritis Father   . Arthritis Sister   . Diabetes type II Sister   . Hypertension Sister     History  Substance Use Topics  . Smoking status: Current Every Day Smoker -- 0.3 packs/day  . Smokeless tobacco: Not on file  . Alcohol Use: Yes     Comment: occasionally; reports 1 pint of vodka per month at payday.      Review of Systems  Respiratory: Negative for shortness of breath.   Cardiovascular: Negative for chest pain.  All other systems reviewed and are negative.    Allergies  Review of patient's allergies indicates no known allergies.  Home Medications   Current Outpatient Rx  Name  Route  Sig  Dispense  Refill  . AMLODIPINE BESYLATE 5 MG PO TABS   Oral   Take 5 mg by mouth daily.         Marland Kitchen AMLODIPINE BESYLATE 5 MG PO TABS   Oral   Take 1 tablet (5 mg total) by mouth  daily.   15 tablet   0   . CLONIDINE HCL 0.2 MG PO TABS   Oral   Take 1.5 tablets (0.3 mg total) by mouth 2 (two) times daily.   30 tablet   0   . CLONIDINE HCL 0.3 MG PO TABS   Oral   Take 0.3 mg by mouth 2 (two) times daily.         . INSULIN GLARGINE 100 UNIT/ML [place] SOLN   Subcutaneous   Inject 15 Units into the skin at bedtime.         . INSULIN GLARGINE 100 UNIT/ML [place] SOLN   Subcutaneous   Inject 20 Units into the skin at bedtime.   10 mL   0   . LISINOPRIL 20 MG PO TABS   Oral   Take 20 mg by mouth 2 (two) times daily.         Marland Kitchen LISINOPRIL 20 MG PO TABS   Oral   Take 1 tablet (20 mg total) by mouth 2 (two) times daily.   30 tablet   0   . LORAZEPAM 1 MG PO TABS      One tab PO  qhs prn sleeplessness   4 tablet   0     BP 194/107  Pulse 92  Temp 98.2 F (36.8 C) (Oral)  Resp 18  Ht 5\' 11"  (1.803 m)  Wt 274 lb (124.286 kg)  BMI 38.22 kg/m2  SpO2 97%  Physical Exam  Nursing note and vitals reviewed. Constitutional: He appears well-developed and well-nourished. No distress.  HENT:  Head: Normocephalic and atraumatic.  Right Ear: External ear normal.  Left Ear: External ear normal.  Eyes: Conjunctivae normal are normal. Right eye exhibits no discharge. Left eye exhibits no discharge. No scleral icterus.  Neck: Neck supple. No tracheal deviation present.  Cardiovascular: Normal rate, regular rhythm and intact distal pulses.   Pulmonary/Chest: Effort normal and breath sounds normal. No stridor. No respiratory distress. He has no wheezes. He has no rales.  Abdominal: Soft. Bowel sounds are normal. He exhibits no distension. There is no tenderness. There is no rebound and no guarding.  Musculoskeletal: He exhibits no edema and no tenderness.  Neurological: He is alert. He has normal strength. No sensory deficit. Cranial nerve deficit:  no gross defecits noted. He exhibits normal muscle tone. He displays no seizure activity. Coordination normal.   Skin: Skin is warm and dry. No rash noted.  Psychiatric: He has a normal mood and affect.    ED Course  Procedures (including critical care time)   Labs Reviewed  URINALYSIS, ROUTINE W REFLEX MICROSCOPIC  URINE RAPID DRUG SCREEN (HOSP PERFORMED)  CBC WITH DIFFERENTIAL  COMPREHENSIVE METABOLIC PANEL   No results found.   No diagnosis found.    MDM  HTN Ordered his home meds.  Pt without complaints of CP or SOB.  No symptoms to suggest acute complications associated with his htn  Cocaine abuse/anxiety Will check labs and consider consult with ACT team.    Care turned over to oncoming MD.        Celene Kras, MD 01/08/12 [phone]

## 2012-01-08 NOTE — ED Notes (Signed)
Attempted to call report, BH unable to accept pt until pt's glucose is 350 or less for 6hrs, testing every two hours. MD made aware.

## 2012-01-08 NOTE — ED Provider Notes (Addendum)
09:03 Tommy, ACT here will see patient.   Pt has told Orvilla Fus he is Suicidal and thinking about jumping off a bridge, states he doesn't want to go home and live with his father any more.   11:45 Telepsych consult ordered.   15:09 Samson Frederic, ACT states will come up after 5 pm. States they need an EKG per [facility].   15:30 Dr Leretha Pol discussed reason for consult.   16:09 Dr Bebe Shaggy given sign off.   Date: 01/08/2012  Rate: 77  Rhythm: normal sinus rhythm  QRS Axis: left  Intervals: normal  ST/T Wave abnormalities: nonspecific ST/T changes  Conduction Disutrbances:LVH  Narrative Interpretation: pulmonary disease pattern.   Old EKG Reviewed: changes noted from 11/06/2010 now has repolarization pattern    Devoria Albe, MD, Franz Dell, MD 01/08/12 1320  Ward Givens, MD 01/08/12 1511  Ward Givens, MD 01/08/12 1541  Ward Givens, MD 01/08/12 1610

## 2012-01-08 NOTE — ED Notes (Signed)
The patient states that he has been using crack/cocaine, his last use was at 8 pm last night.

## 2012-01-08 NOTE — Progress Notes (Addendum)
[name] [name] at [facility] reported pt has been accepted to [facility] by Donell Sievert to Dr Daleen Bo Room 500-2.  See Support paperwork.

## 2012-01-08 NOTE — ED Notes (Addendum)
Patient complaining of panic attacks off and on x 2 days. States he feels "jittery" and feels like another attack is coming on now. Admits to using "crack cocaine" earlier today.

## 2012-01-08 NOTE — ED Notes (Signed)
Spoke with Steven at SOC. Dr. Santiago has been assigned-she is currently on another consult and has 4 pending, but is aware.  

## 2012-01-08 NOTE — ED Notes (Signed)
Patient states that he is seeking help with his drug problem with crack/cocaine as well as his anxiety.
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
## 2012-01-08 NOTE — ED Notes (Signed)
Ordered pt breakfast tray

## 2012-01-08 NOTE — ED Notes (Signed)
Spoke with [name] [name] from [facility]. Pt asked by RN what he hopes to gain from being hospitalized-pt states he wants treatment from substance abuse and help coping with his anxiety because it is worse than it has ever been.  Awaiting telepsych consult and ekg for futher assessment.

## 2012-01-08 NOTE — ED Notes (Signed)
Pt given water to drink. 

## 2012-01-09 ENCOUNTER — Encounter (HOSPITAL_COMMUNITY): Payer: Self-pay

## 2012-01-09 ENCOUNTER — Inpatient Hospital Stay (HOSPITAL_COMMUNITY)
Admission: RE | Admit: 2012-01-09 | Discharge: 2012-01-15 | DRG: 897 | Disposition: A | Discharge status: Home / Self Care | Payer: Medicaid Other | Source: Ambulatory Visit | Attending: Emergency Medicine | Admitting: Emergency Medicine

## 2012-01-09 DIAGNOSIS — G4700 Insomnia, unspecified: Secondary | ICD-10-CM

## 2012-01-09 DIAGNOSIS — F14180 Cocaine abuse with cocaine-induced anxiety disorder: Secondary | ICD-10-CM

## 2012-01-09 DIAGNOSIS — F1994 Other psychoactive substance use, unspecified with psychoactive substance-induced mood disorder: Principal | ICD-10-CM | POA: Diagnosis present

## 2012-01-09 DIAGNOSIS — R739 Hyperglycemia, unspecified: Secondary | ICD-10-CM

## 2012-01-09 DIAGNOSIS — F329 Major depressive disorder, single episode, unspecified: Secondary | ICD-10-CM

## 2012-01-09 DIAGNOSIS — R45851 Suicidal ideations: Secondary | ICD-10-CM

## 2012-01-09 DIAGNOSIS — F410 Panic disorder [episodic paroxysmal anxiety] without agoraphobia: Secondary | ICD-10-CM | POA: Diagnosis present

## 2012-01-09 DIAGNOSIS — I10 Essential (primary) hypertension: Secondary | ICD-10-CM | POA: Diagnosis present

## 2012-01-09 DIAGNOSIS — E119 Type 2 diabetes mellitus without complications: Secondary | ICD-10-CM | POA: Diagnosis present

## 2012-01-09 DIAGNOSIS — K219 Gastro-esophageal reflux disease without esophagitis: Secondary | ICD-10-CM | POA: Diagnosis present

## 2012-01-09 DIAGNOSIS — F1210 Cannabis abuse, uncomplicated: Secondary | ICD-10-CM | POA: Diagnosis present

## 2012-01-09 DIAGNOSIS — Z79899 Other long term (current) drug therapy: Secondary | ICD-10-CM

## 2012-01-09 DIAGNOSIS — F1410 Cocaine abuse, uncomplicated: Secondary | ICD-10-CM | POA: Diagnosis present

## 2012-01-09 DIAGNOSIS — F19988 Other psychoactive substance use, unspecified with other psychoactive substance-induced disorder: Secondary | ICD-10-CM

## 2012-01-09 DIAGNOSIS — F1910 Other psychoactive substance abuse, uncomplicated: Secondary | ICD-10-CM

## 2012-01-09 DIAGNOSIS — R51 Headache: Secondary | ICD-10-CM | POA: Diagnosis present

## 2012-01-09 LAB — GLUCOSE, CAPILLARY
Glucose-Capillary: 302 mg/dL — ABNORMAL HIGH (ref 70–99)
Glucose-Capillary: 271 mg/dL — ABNORMAL HIGH (ref 70–99)
Glucose-Capillary: 352 mg/dL — ABNORMAL HIGH (ref 70–99)
Glucose-Capillary: 340 mg/dL — ABNORMAL HIGH (ref 70–99)

## 2012-01-09 MED ORDER — CLONIDINE HCL 0.1 MG PO TABS
0.3000 mg | ORAL_TABLET | Freq: Two times a day (BID) | ORAL | Status: DC
  Administered 2012-01-09 – 2012-01-15 (×11): 0.3 mg via ORAL
  Filled 2012-01-09 (×16): qty 1

## 2012-01-09 MED ORDER — TRAZODONE HCL 50 MG PO TABS
50.0000 mg | ORAL_TABLET | Freq: Every day | ORAL | Status: DC
  Administered 2012-01-09 – 2012-01-14 (×5): 50 mg via ORAL
  Filled 2012-01-09 (×9): qty 1

## 2012-01-09 MED ORDER — INSULIN ASPART 100 UNIT/ML ~~LOC~~ SOLN: 0.0000 [IU] | Freq: Three times a day (TID) | SUBCUTANEOUS | Status: DC

## 2012-01-09 MED ORDER — INFLUENZA VIRUS VACC SPLIT PF IM SUSP
0.5000 mL | INTRAMUSCULAR | Status: AC
  Administered 2012-01-10: 0.5 mL via INTRAMUSCULAR

## 2012-01-09 MED ORDER — INSULIN ASPART 100 UNIT/ML ~~LOC~~ SOLN
0.0000 [IU] | Freq: Three times a day (TID) | SUBCUTANEOUS | Status: DC
  Administered 2012-01-09: 15 [IU] via SUBCUTANEOUS
  Administered 2012-01-09: 11 [IU] via SUBCUTANEOUS
  Administered 2012-01-10: 8 [IU] via SUBCUTANEOUS
  Administered 2012-01-10: 11 [IU] via SUBCUTANEOUS
  Administered 2012-01-10: 15 [IU] via SUBCUTANEOUS
  Administered 2012-01-11: 11 [IU] via SUBCUTANEOUS
  Administered 2012-01-11 – 2012-01-12 (×4): 8 [IU] via SUBCUTANEOUS
  Administered 2012-01-13: 11 [IU] via SUBCUTANEOUS
  Administered 2012-01-13 (×2): 15 [IU] via SUBCUTANEOUS
  Administered 2012-01-14 (×2): 8 [IU] via SUBCUTANEOUS
  Administered 2012-01-15 (×2): 11 [IU] via SUBCUTANEOUS

## 2012-01-09 MED ORDER — AMLODIPINE BESYLATE 5 MG PO TABS
5.0000 mg | ORAL_TABLET | Freq: Every day | ORAL | Status: DC
  Administered 2012-01-10 – 2012-01-15 (×6): 5 mg via ORAL
  Filled 2012-01-09 (×10): qty 1

## 2012-01-09 MED ORDER — INSULIN ASPART 100 UNIT/ML ~~LOC~~ SOLN
4.0000 [IU] | Freq: Three times a day (TID) | SUBCUTANEOUS | Status: DC
  Administered 2012-01-09 – 2012-01-13 (×12): 4 [IU] via SUBCUTANEOUS

## 2012-01-09 MED ORDER — NICOTINE 21 MG/24HR TD PT24
21.0000 mg | MEDICATED_PATCH | Freq: Every day | TRANSDERMAL | Status: DC
  Filled 2012-01-09 (×2): qty 1

## 2012-01-09 MED ORDER — MAGNESIUM HYDROXIDE 400 MG/5ML PO SUSP: 30.0000 mL | Freq: Every day | ORAL | Status: DC | PRN

## 2012-01-09 MED ORDER — FLUOXETINE HCL 10 MG PO CAPS
10.0000 mg | ORAL_CAPSULE | Freq: Every day | ORAL | Status: DC
  Administered 2012-01-09 – 2012-01-15 (×7): 10 mg via ORAL
  Filled 2012-01-09 (×9): qty 1

## 2012-01-09 MED ORDER — LISINOPRIL 20 MG PO TABS
20.0000 mg | ORAL_TABLET | Freq: Two times a day (BID) | ORAL | Status: DC
  Administered 2012-01-09 – 2012-01-15 (×12): 20 mg via ORAL
  Filled 2012-01-09 (×19): qty 1

## 2012-01-09 MED ORDER — ACETAMINOPHEN 325 MG PO TABS
650.0000 mg | ORAL_TABLET | Freq: Four times a day (QID) | ORAL | Status: DC | PRN
  Administered 2012-01-11 – 2012-01-12 (×3): 650 mg via ORAL

## 2012-01-09 MED ORDER — HYDROXYZINE HCL 25 MG PO TABS
25.0000 mg | ORAL_TABLET | Freq: Three times a day (TID) | ORAL | Status: DC | PRN
  Administered 2012-01-10 – 2012-01-15 (×12): 25 mg via ORAL

## 2012-01-09 MED ORDER — INSULIN GLARGINE 100 UNIT/ML ~~LOC~~ SOLN
20.0000 [IU] | Freq: Every day | SUBCUTANEOUS | Status: DC
  Administered 2012-01-09 – 2012-01-11 (×3): 20 [IU] via SUBCUTANEOUS

## 2012-01-09 MED ORDER — ALUM & MAG HYDROXIDE-SIMETH 200-200-20 MG/5ML PO SUSP
30.0000 mL | ORAL | Status: DC | PRN
  Administered 2012-01-10 (×2): 30 mL via ORAL

## 2012-01-09 NOTE — Progress Notes (Signed)
Psychoeducational Group Note  Date:  01/09/2012 Time:  2000  Group Topic/Focus:  Karaoke   Participation Level:  Did Not Attend  Participation Quality:    Affect:    Cognitive:    Insight:    Engagement in Group:    Additional Comments:  Pt didn't attend karaoke this evening.   Carden Teel A 01/09/2012, 10:13 PM

## 2012-01-09 NOTE — [facility] Suicide Risk Assessment (Signed)
Suicide Risk Assessment  Admission Assessment     Nursing information obtained from:    Demographic factors:   Male, african [place]. Current Mental Status:   admitted with suicidal thoughts. Loss Factors:    Historical Factors: History of long term cocaine abuse, family history of alcoholism, depression.   Risk Reduction Factors:Housing     CLINICAL FACTORS:   Panic Attacks Depression:   Hopelessness Insomnia  COGNITIVE FEATURES THAT CONTRIBUTE TO RISK:  Cognitively intact  SUICIDE RISK:   Mild:  Suicidal ideation of limited frequency, intensity, duration, and specificity.  There are no identifiable plans, no associated intent, mild dysphoria and related symptoms, good self-control (both objective and subjective assessment), few other risk factors, and identifiable protective factors, including available and accessible social support.  PLAN OF CARE: Reinitiate home medications as appropriate. Encourage patient to attend groups.   [name] [name] 01/09/2012, 1:07 PM

## 2012-01-09 NOTE — Progress Notes (Signed)
Inpatient Diabetes Program Recommendations  AACE/ADA: New Consensus Statement on Inpatient Glycemic Control (2013)  Target Ranges:  Prepandial:   less than 140 mg/dL      Peak postprandial:   less than 180 mg/dL (1-2 hours)      Critically ill patients:  140 - 180 mg/dL   Reason for Assessment:  Hyperglycemia  Pt presents with worsening anxiety. He has history of anxiety and panic attacks. He had been seen in the ED approximately one month ago for similar symptoms. Outpatient treatment has not been effective. Pt also has history of cocaine abuse and has not been able to cease using cocaine. Pt verbalizes S/I.  Hx of DM - Type 2 on insulin.    Results for Vigo, JOHNATTAN STRASSMAN (MRN [id number]) as of 01/09/2012 16:25  Ref. Range 01/08/2012 22:25 01/09/2012 01:01 01/09/2012 03:32 01/09/2012 06:29 01/09/2012 09:45 01/09/2012 11:51  Glucose-Capillary Latest Range: 70-99 mg/dL 782 (H) 956 (H) 213 (H) 303 (H) 323 (H) 352 (H)  Results for Castilla, CHRISTIANO BLANDON (MRN [id number]) as of 01/09/2012 16:25  Ref. Range 11/08/2010 05:33  Hemoglobin A1C Latest Range: <5.7 % 8.1 (H)  Results for Swartzendruber, TAGEN MILBY (MRN [id number]) as of 01/09/2012 16:25  Ref. Range 11/08/2010 05:33  Sodium Latest Range: 135-145 mEq/L 137  Potassium Latest Range: 3.5-5.1 mEq/L 3.2 (L)  Chloride Latest Range: 96-112 mEq/L 101  CO2 Latest Range: 19-32 mEq/L 28  Mean Plasma Glucose Latest Range: <117 mg/dL 244 (H)  BUN Latest Range: 6-23 mg/dL 17  Creatinine Latest Range: 0.50-1.35 mg/dL 0.10  Calcium Latest Range: 8.4-10.5 mg/dL 8.8  GFR calc non Af Amer Latest Range: >90 mL/min 63 (L)  GFR calc Af Amer Latest Range: >90 mL/min 73 (L)  Glucose Latest Range: 70-99 mg/dL 272 (H)   Recommendations:  Check HgbA1C to assess glycemic control prior to hospitalization. Will probably need uptitration of Lantus - 25 units QHS Meal coverage insulin - Increase to Novolog 6 units tidwc.  Will continue to follow daily for insulin adjustments.

## 2012-01-09 NOTE — Progress Notes (Signed)
Psychoeducational Group Note  Date:  01/09/2012 Time:  1100  Group Topic/Focus:  Overcoming Stress:   The focus of this group is to define stress and help patients assess their triggers.  Participation Level:  Minimal  Participation Quality:  Appropriate and Attentive  Affect:  Appropriate  Cognitive:  Appropriate  Insight:  Supportive  Engagement in Group:  Limited  Additional Comments:  Patient attended group late due to admission arrival at the hospital. Patient did share when asked to speak about things that stress patient out and how to manage stress.   Karleen Hampshire Brittini 01/09/2012, 1:12 PM
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
## 2012-01-09 NOTE — Tx Team (Addendum)
Initial Interdisciplinary Treatment Plan  PATIENT STRENGTHS: (choose at least two) Ability for insight Capable of independent living  PATIENT STRESSORS: Marital or family conflict Medication change or noncompliance   PROBLEM LIST: Problem List/Patient Goals Date to be addressed Date deferred Reason deferred Estimated date of resolution  Anxiety 01/09/12   D/C  Substance Abuse 01/09/12   D/C  Potential for Self-Harm 01/09/12   D/C  Depression 01/09/12   D/C                                 DISCHARGE CRITERIA:  Need for constant or close observation no longer present Reduction of life-threatening or endangering symptoms to within safe limits  PRELIMINARY DISCHARGE PLAN: Attend 12-step recovery group Outpatient therapy  PATIENT/FAMIILY INVOLVEMENT: This treatment plan has been presented to and reviewed with the patient, [name] Michaell Cowing., and/or family member, .  The patient and family have been given the opportunity to ask questions and make suggestions.  [name] [name] 01/09/2012, 11:30 AM

## 2012-01-09 NOTE — ED Notes (Signed)
[facility] called [facility]out [phone] for transport to [facility].  Spoke with Maisie Fus.

## 2012-01-09 NOTE — ED Notes (Signed)
Attempted to call report on pt to [facility] twice and nobody is answering the phone.
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
## 2012-01-10 DIAGNOSIS — F322 Major depressive disorder, single episode, severe without psychotic features: Secondary | ICD-10-CM

## 2012-01-10 LAB — GLUCOSE, CAPILLARY
Glucose-Capillary: 277 mg/dL — ABNORMAL HIGH (ref 70–99)
Glucose-Capillary: 460 mg/dL — ABNORMAL HIGH (ref 70–99)
Glucose-Capillary: 340 mg/dL — ABNORMAL HIGH (ref 70–99)

## 2012-01-10 MED ORDER — INSULIN ASPART 100 UNIT/ML ~~LOC~~ SOLN
15.0000 [IU] | Freq: Once | SUBCUTANEOUS | Status: AC
  Administered 2012-01-10: 15 [IU] via SUBCUTANEOUS

## 2012-01-10 NOTE — Progress Notes (Signed)
[facility] LCSW Group Therapy        Feelings Around Relapse  01/10/2012 1:15   Type of Therapy:  Group Therapy  Participation Level:    Participation Quality:    Affect  Cognitive:    Insight:    Engagement in Therapy:   Modes of Intervention:    Summary of Progress/Problems:  Patient did not attend group.  Wynn Banker 01/10/2012, 2:31 PM

## 2012-01-10 NOTE — Tx Team (Signed)
Interdisciplinary Treatment Plan Update (Adult)  Date:  01/10/2012  Time Reviewed:  9:45 AM   Progress in Treatment: Attending groups:   Yes   Participating in groups:  Yes Taking medication as prescribed:  Yes Tolerating medication:  Yes Family/Significant othe contact made: Contact to be made with family Patient understands diagnosis:  Yes Discussing patient identified problems/goals with staff: Yes Medical problems stabilized or resolved: Yes Denies suicidal/homicidal ideation:Yes Issues/concerns per patient self-inventory:  Other:   New problem(s) identified:  Reason for Continuation of Hospitalization: Anxiety Depression Medication stabilization  Interventions implemented related to continuation of hospitalization:  Medication Management; safety checks q 15 mins  Additional comments:  Estimated length of stay: 2-3  days  Discharge Plan:  Home with outpatient follow up  New goal(s):  Review of initial/current patient goals per problem list:    1.  Goal(s): Eliminate SI/other thoughts of self harm   Met:  No  Target date: d/c  As evidenced by: Patient no longer endorsing SI/HI or other thoughts of self harm.     2.  Goal (s):Reduce depression/anxiety Met: Yes  Target date: d/c  As evidenced by: Patient currently rating symptoms at four or below    3.  Goal(s):.stabilize on meds   Met:  No  Target date: d/c  As evidenced by: Patient reports being stabilized on medications - less symptomatic    4.  Goal(s): Refer for outpatient follow up   Met:  No  Target date: d/c  As evidenced by: Follow up appointment will scheduled    Attendees: Patient:   01/10/2012 9:45 AM  Physican:  Patrick North, MD 01/10/2012 9:45 AM  Nursing:  , RN 01/10/2012 9:45 AM   Nursing:   , RN 01/10/2012 9:45 AM   Clinical Social Worker:  Juline Patch, LCSW 01/10/2012 9:45 AM   Other:  Patton Salles, Clinical Social Worker, LCSW  01/10/2012 9:45 AM

## 2012-01-10 NOTE — Progress Notes (Signed)
[facility] MD Progress Note  01/10/2012 10:37 AM [name] [name].  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 Substance induced mood disorder, Anxiety disorder, Cocaine induced anxiety disorder   ADL's:  Intact  Sleep: Fair Patient states that sleep has improved some with medication  Appetite:  Good  Suicidal Ideation:  Yes  Homicidal Ideation:  No  AEB (as evidenced by):  Patient states that he cares for his father which is to much for him right now.  Patient states that he has had increased depression and anxiety with panic attacks.  Patient states that he is looking for housing because he is unable to care for his father who needs a lot of hands on care; "I stay with my Dad caring for him but it is to much form.  I want to find a place to stay".  Patient states that he has a history of visual hallucinations (shadows) but it has been a month since last had any hallucinations.  Denies visual or tactile hallucinations. Patient states that he was once hospitalized in the 90's related to suicide attempt and was the only other time other than present for hospitalization.  Patient states that he has been in touch with [facility] who is looking for him a place to stay and needs someone to contact to see if an apartment has been found.  States that he is unable to stay with his father related to the increase of depression and anxiety he gets when having to care for him.  Psychiatric Specialty Exam: ROS  Blood pressure 141/94, pulse 70, temperature 98.7 F (37.1 C), temperature source Oral, resp. rate 18, height 5\' 9"  (1.753 m), weight 118.842 kg (262 lb).Body mass index is 38.69 kg/(m^2).  General Appearance: Casual and Disheveled  Eye Contact::  Fair  Speech:  Clear and Coherent and Normal Rate  Volume:  Normal  Mood:  Anxious, Depressed and Hopeless  Affect:  Depressed and Flat  Thought Process:  Circumstantial and Goal Directed  Orientation:   Full (Time, Place, and Person)  Thought Content:  Rumination  Suicidal Thoughts:  No  Homicidal Thoughts:  No  Memory:  Immediate;   Fair Recent;   Fair  Judgement:  Poor  Insight:  Lacking  Psychomotor Activity:  Normal  Concentration:  Fair  Recall:  Fair  Akathisia:  No  Handed:  Right  AIMS (if indicated):     Assets:  Communication Skills Desire for Improvement  Sleep:  Number of Hours: 6.25    Current Medications: Current Facility-Administered Medications  Medication Dose Route Frequency Provider Last Rate Last Dose  . acetaminophen (TYLENOL) tablet 650 mg  650 mg Oral Q6H PRN Sanjuana Kava, NP      . alum & mag hydroxide-simeth (MAALOX/MYLANTA) 200-200-20 MG/5ML suspension 30 mL  30 mL Oral Q4H PRN Sanjuana Kava, NP      . amLODipine (NORVASC) tablet 5 mg  5 mg Oral Daily Sanjuana Kava, NP   5 mg at 01/10/12 0745  . cloNIDine (CATAPRES) tablet 0.3 mg  0.3 mg Oral BID Sanjuana Kava, NP   0.3 mg at 01/10/12 0744  . FLUoxetine (PROZAC) capsule 10 mg  10 mg Oral Daily Himabindu Ravi, MD   10 mg at 01/10/12 0745  . hydrOXYzine (ATARAX/VISTARIL) tablet 25 mg  25 mg Oral TID PRN Himabindu Ravi, MD   25 mg at 01/10/12 1032  . influenza  inactive virus vaccine (  FLUZONE/FLUARIX) injection 0.5 mL  0.5 mL Intramuscular Tomorrow-1000 Sanjuana Kava, NP      . insulin aspart (novoLOG) injection 0-15 Units  0-15 Units Subcutaneous TID WC Sanjuana Kava, NP   8 Units at 01/10/12 0640  . insulin aspart (novoLOG) injection 4 Units  4 Units Subcutaneous TID WC Sanjuana Kava, NP   4 Units at 01/10/12 [phone]  . insulin glargine (LANTUS) injection 20 Units  20 Units Subcutaneous QHS Sanjuana Kava, NP   20 Units at 01/09/12 2212  . lisinopril (PRINIVIL,ZESTRIL) tablet 20 mg  20 mg Oral BID Sanjuana Kava, NP   20 mg at 01/10/12 0745  . magnesium hydroxide (MILK OF MAGNESIA) suspension 30 mL  30 mL Oral Daily PRN Sanjuana Kava, NP      . traZODone (DESYREL) tablet 50 mg  50 mg Oral QHS Sanjuana Kava, NP    50 mg at 01/09/12 2212  . [DISCONTINUED] insulin aspart (novoLOG) injection 0-15 Units  0-15 Units Subcutaneous TID WC Sanjuana Kava, NP      . [DISCONTINUED] nicotine (NICODERM CQ - dosed in mg/24 hours) patch 21 mg  21 mg Transdermal Q0600 Sanjuana Kava, NP       Facility-Administered Medications Ordered in Other Encounters  Medication Dose Route Frequency Provider Last Rate Last Dose  . [DISCONTINUED] amLODipine (NORVASC) tablet 5 mg  5 mg Oral Daily Celene Kras, MD   5 mg at 01/09/12 0932  . [DISCONTINUED] cloNIDine (CATAPRES) tablet 0.3 mg  0.3 mg Oral BID Celene Kras, MD   0.3 mg at 01/09/12 0932  . [DISCONTINUED] insulin glargine (LANTUS) injection 20 Units  20 Units Subcutaneous QHS Ward Givens, MD   20 Units at 01/08/12 2152  . [DISCONTINUED] lisinopril (PRINIVIL,ZESTRIL) tablet 20 mg  20 mg Oral BID Celene Kras, MD   20 mg at 01/09/12 0932  . [DISCONTINUED] PARoxetine (PAXIL) tablet 20 mg  20 mg Oral Daily Joya Gaskins, MD   20 mg at 01/09/12 0932    Lab Results:  Results for orders placed during the hospital encounter of 01/09/12 (from the past 48 hour(s))  GLUCOSE, CAPILLARY     Status: Abnormal   Collection Time   01/09/12 11:51 AM      Component Value Range Comment   Glucose-Capillary 352 (*) 70 - 99 mg/dL    Comment 1 Notify RN     GLUCOSE, CAPILLARY     Status: Abnormal   Collection Time   01/09/12  5:12 PM      Component Value Range Comment   Glucose-Capillary 340 (*) 70 - 99 mg/dL    Comment 1 Notify RN     HEMOGLOBIN A1C     Status: Abnormal   Collection Time   01/09/12  8:16 PM      Component Value Range Comment   Hemoglobin A1C 10.8 (*) <5.7 %    Mean Plasma Glucose 263 (*) <117 mg/dL   GLUCOSE, CAPILLARY     Status: Abnormal   Collection Time   01/09/12  9:35 PM      Component Value Range Comment   Glucose-Capillary 358 (*) 70 - 99 mg/dL    Comment 1 Notify RN     GLUCOSE, CAPILLARY     Status: Abnormal   Collection Time   01/10/12  6:10 AM       Component Value Range Comment   Glucose-Capillary 277 (*) 70 - 99 mg/dL  Physical Findings: AIMS: Facial and Oral Movements Muscles of Facial Expression: None, normal Lips and Perioral Area: None, normal Jaw: None, normal Tongue: None, normal,Extremity Movements Upper (arms, wrists, hands, fingers): None, normal Lower (legs, knees, ankles, toes): None, normal, Trunk Movements Neck, shoulders, hips: None, normal, Overall Severity Severity of abnormal movements (highest score from questions above): None, normal Incapacitation due to abnormal movements: None, normal Patient's awareness of abnormal movements (rate only patient's report): No Awareness, Dental Status Current problems with teeth and/or dentures?: No Does patient usually wear dentures?: No  CIWA:    COWS:     Treatment Plan Summary: 1. Daily contact with patient to assess and evaluate symptoms and progress in treatment.  2. Medication management  3. The patient will deny suicidal ideations 48 hours prior to discharge and have a depression and      anxiety rating of 3 or less.  4. The patient will also deny any auditory or visual hallucinations or delusional thinking.    Plan:  Medical Decision Making Problem Points:  Established problem, stable/improving (1) and Review of psycho-social stressors (1) Data Points:  Review or order medicine tests (1) Review of medication regiment & side effects (2) Review of new medications or change in dosage (2)  I certify that inpatient services furnished can reasonably be expected to improve the patient's condition.   [name] [name] 01/10/2012, 10:37 AM

## 2012-01-10 NOTE — H&P (Signed)
Patient seen and assessed. Agree with above A& R.

## 2012-01-10 NOTE — Progress Notes (Signed)
Patient ID: [name] Lim., male   DOB: December 03, 1960, 51 y.o.   MRN: [id number]  D: Pt states he still has panic attacks. Pt states he hasn't slept much since he came here.   Pt denies SI/HI/AVH. Pt is pleasant and cooperative. Pt CBG 358 at 2135. Pt states his father stresses him out, because he has to take care of him and they live where they have minimal social interaction.  A: Pt was offered support and encouragement. Pt was given scheduled medications. Pt was encourage to attend groups. Q 15 minute checks were done for safety. Pt given 20 units of lantus     R:Pt attends groups and interacts well with peers and staff. Pt is taking medication. Pt has no complaints at this time.Pt receptive to treatment and safety maintained on unit.

## 2012-01-10 NOTE — Progress Notes (Signed)
(  D) Patient's CBG 460. Patient stated that he had eaten a cookie and drank chocolate milk this AM. (A) Aggie Nwoko notified. Sliding scale and lunchtime Novolog administered. Education reinforced on self administration of insulin. Patient administered his own injection without incident. (R) Patient alert and oriented. No complaints. Joice Lofts RN MS EdS 01/10/2012  12:57 PM

## 2012-01-10 NOTE — Progress Notes (Signed)
Patient ID: [name] Lim., male   DOB: [birth year], 51 y.o.   MRN: [id number] Pt came to the med window shortly after the beginning of the shift and asked for something for anxiety, and was given vistaril.  Went to group and said afterward that it had helped.   CBG was 444, NP was notified and order obtained for 15 units of novalog tonight, as well as the scheduled  20 units of lantus, which were given.  Went to bed, but then requested trazadone, which was given, and he is resting quietly No other c/o's voiced at this time.   A)  Will continue to monitor q 15 minutes for safety, will recheck CBG R)  Appreciative, remains safe on unit at this time.    Addendum:  CBG recheck was 336.

## 2012-01-10 NOTE — Progress Notes (Signed)
(  D) Patient presents as flat and sad today with fair eye contact. He reports appetite good, energy level low, and ability to pay attention improving. Patient rates depression at 5/10 and hopelessness at 5/10 (10 being most hopeful). Endorses passive SI with no plan while on unit.Physical complaints include sedation, lightheadedness, dizziness, headaches and blurred vision. Rates headache at 2 with pain  goal of 0. Patient hopes to get assisted with stable housing at discharge. (A) Encouraged patient to increase fluid intake and to attend all groups. Requested that patient contract for safety while on unit. (R) Patient contracted for safety. Joice Lofts RN MS EdS 01/10/2012  8:58 AM

## 2012-01-11 ENCOUNTER — Inpatient Hospital Stay (HOSPITAL_COMMUNITY): Payer: Medicaid Other

## 2012-01-11 ENCOUNTER — Encounter (HOSPITAL_COMMUNITY): Payer: Self-pay | Admitting: *Deleted

## 2012-01-11 DIAGNOSIS — F329 Major depressive disorder, single episode, unspecified: Secondary | ICD-10-CM

## 2012-01-11 LAB — GLUCOSE, CAPILLARY
Glucose-Capillary: 284 mg/dL — ABNORMAL HIGH (ref 70–99)
Glucose-Capillary: 270 mg/dL — ABNORMAL HIGH (ref 70–99)

## 2012-01-11 MED ORDER — SODIUM CHLORIDE 0.9 % IV BOLUS (SEPSIS)
1000.0000 mL | Freq: Once | INTRAVENOUS | Status: AC
  Administered 2012-01-11: 1000 mL via INTRAVENOUS

## 2012-01-11 MED ORDER — INSULIN ASPART 100 UNIT/ML ~~LOC~~ SOLN
15.0000 [IU] | Freq: Once | SUBCUTANEOUS | Status: AC
  Administered 2012-01-11: 15 [IU] via SUBCUTANEOUS

## 2012-01-11 NOTE — ED Notes (Signed)
[id number]<AV> Expected date:01/11/12<BR> Expected time:10:49 PM<BR> Means of arrival:Car<BR> Comments:<BR> Triage: transfer from [facility]. Hyperglycemia and HTN.

## 2012-01-11 NOTE — Clinical Social Work Note (Signed)
[facility] Group Notes: (Clinical Social Work)   01/11/2012  3-4pm   Type of Therapy:  Group Therapy   Participation Level:  Did Not Attend    Ambrose Mantle, LCSW 01/11/2012, 4:28 PM

## 2012-01-11 NOTE — Progress Notes (Signed)
Psychoeducational Group Note  Date:  01/10/2012 Time:  2000  Group Topic/Focus:  Wrap-Up Group:   The focus of this group is to help patients review their daily goal of treatment and discuss progress on daily workbooks.  Participation Level:  None  Participation Quality:  Appropriate  Affect:  Appropriate  Cognitive:  Appropriate  Insight:  None  Engagement in Group:  None  Additional Comments:  Pt attended wrap-up group this evening but didn't participated.   Jemery Stacey A 01/11/2012, 2:27 AM

## 2012-01-11 NOTE — ED Notes (Signed)
Pt from [facility] with c/o hyperglycemia and hypertension. Patient sts he is being treated there for panic attacks. Patient lethargic at this time, sts he is very sleepy and hungry. CBG 485, bp 166/102

## 2012-01-11 NOTE — ED Provider Notes (Signed)
History     CSN: [id number]  Arrival date & time 01/11/12  2320   First MD Initiated Contact with Patient 01/11/12 2326      Chief Complaint  Patient presents with  . Hyperglycemia   HPI  History provided by the patient. Patient is a 51 year old male with history of hypertension, diabetes, polysubstance abuse, depression and anxiety who presents with elevated blood sugar and blood pressures. Patient is currently being treated for increased anxiety. Patient was noted to be having elevated blood sugars during frequent checks today. He was receiving oral medications and insulin without significant improvements. Patient was sent to emergency room for additional evaluation and treatments. Patient was also noticed to have elevated blood pressure at times throughout the day. Patient currently has no significant complaints. He does report having some recent increased congestion and cough. Cough is occasionally productive of white and yellow phlegm. Denies any chest pain or shortness of breath. Denies any fever, chills or sweats.    Past Medical History  Diagnosis Date  . Hypertension   . Diabetes mellitus   . GERD (gastroesophageal reflux disease)   . Pericarditis   . Polysubstance abuse     cocaine, marijuana  . Auditory hallucinations   . Depression   . Hx of medication noncompliance   . History of suicide attempt     Past Surgical History  Procedure Date  . Pericardial fluid drainage     Family History  Problem Relation Age of Onset  . Stroke Mother   . Diabetes type II Mother   . Hypertension Father   . Arthritis Father   . Arthritis Sister   . Diabetes type II Sister   . Hypertension Sister     History  Substance Use Topics  . Smoking status: Current Every Day Smoker -- 0.3 packs/day  . Smokeless tobacco: Not on file  . Alcohol Use: Yes     Comment: occasionally; reports 1 pint of vodka per month at payday.      Review of Systems  Constitutional: Negative for  fever, chills and diaphoresis.  HENT: Positive for congestion. Negative for rhinorrhea.   Respiratory: Positive for cough. Negative for shortness of breath.   Cardiovascular: Negative for chest pain.  Gastrointestinal: Negative for nausea, vomiting, abdominal pain and diarrhea.  Genitourinary: Negative for dysuria and frequency.  All other systems reviewed and are negative.    Allergies  Review of patient's allergies indicates no known allergies.  Home Medications   Current Outpatient Rx  Name  Route  Sig  Dispense  Refill  . AMLODIPINE BESYLATE 5 MG PO TABS   Oral   Take 1 tablet (5 mg total) by mouth daily.   15 tablet   0   . CLONIDINE HCL 0.2 MG PO TABS   Oral   Take 1.5 tablets (0.3 mg total) by mouth 2 (two) times daily.   30 tablet   0   . INSULIN GLARGINE 100 UNIT/ML [place] SOLN   Subcutaneous   Inject 20 Units into the skin at bedtime.   10 mL   0   . LISINOPRIL 20 MG PO TABS   Oral   Take 1 tablet (20 mg total) by mouth 2 (two) times daily.   30 tablet   0     BP 166/102  Pulse 74  Temp 98.3 F (36.8 C) (Oral)  Resp 18  Ht 5\' 9"  (1.753 m)  Wt 262 lb (118.842 kg)  BMI 38.69 kg/m2  SpO2 97%  Physical Exam  Nursing note and vitals reviewed. Constitutional: He is oriented to person, place, and time. He appears well-developed and well-nourished. No distress.  HENT:  Head: Normocephalic.  Neck: Normal range of motion. Neck supple.       No meningeal signs  Cardiovascular: Normal rate and regular rhythm.   No murmur heard. Pulmonary/Chest: Effort normal and breath sounds normal. No respiratory distress. He has no wheezes. He has no rales.  Abdominal: Soft.  Neurological: He is alert and oriented to person, place, and time. He has normal strength. No cranial nerve deficit or sensory deficit. Gait normal.  Skin: Skin is warm.  Psychiatric: He has a normal mood and affect. His behavior is normal.    ED Course  Procedures   Results for orders placed  during the hospital encounter of 01/09/12  GLUCOSE, CAPILLARY      Component Value Range   Glucose-Capillary 352 (*) 70 - 99 mg/dL   Comment 1 Notify RN    HEMOGLOBIN A1C      Component Value Range   Hemoglobin A1C 10.8 (*) <5.7 %   Mean Plasma Glucose 263 (*) <117 mg/dL  GLUCOSE, CAPILLARY      Component Value Range   Glucose-Capillary 340 (*) 70 - 99 mg/dL   Comment 1 Notify RN    GLUCOSE, CAPILLARY      Component Value Range   Glucose-Capillary 358 (*) 70 - 99 mg/dL   Comment 1 Notify RN    GLUCOSE, CAPILLARY      Component Value Range   Glucose-Capillary 277 (*) 70 - 99 mg/dL  GLUCOSE, CAPILLARY      Component Value Range   Glucose-Capillary 460 (*) 70 - 99 mg/dL   Comment 1 Documented in Chart     Comment 2 Notify RN    GLUCOSE, CAPILLARY      Component Value Range   Glucose-Capillary 340 (*) 70 - 99 mg/dL   Comment 1 Notify RN    GLUCOSE, CAPILLARY      Component Value Range   Glucose-Capillary 444 (*) 70 - 99 mg/dL   Comment 1 Notify RN    GLUCOSE, CAPILLARY      Component Value Range   Glucose-Capillary 336 (*) 70 - 99 mg/dL   Comment 1 Notify RN    GLUCOSE, CAPILLARY      Component Value Range   Glucose-Capillary 284 (*) 70 - 99 mg/dL  GLUCOSE, CAPILLARY      Component Value Range   Glucose-Capillary 270 (*) 70 - 99 mg/dL  GLUCOSE, CAPILLARY      Component Value Range   Glucose-Capillary 287 (*) 70 - 99 mg/dL  GLUCOSE, CAPILLARY      Component Value Range   Glucose-Capillary 320 (*) 70 - 99 mg/dL  CBC WITH DIFFERENTIAL      Component Value Range   WBC 11.5 (*) 4.0 - 10.5 K/uL   RBC 4.80  4.22 - 5.81 MIL/uL   Hemoglobin 14.7  13.0 - 17.0 g/dL   HCT 16.1  09.6 - 04.5 %   MCV 86.0  78.0 - 100.0 fL   MCH 30.6  26.0 - 34.0 pg   MCHC 35.6  30.0 - 36.0 g/dL   RDW 40.9  81.1 - 91.4 %   Platelets 193  150 - 400 K/uL   Neutrophils Relative 65  43 - 77 %   Neutro Abs 7.5  1.7 - 7.7 K/uL   Lymphocytes Relative 25  12 - 46 %  Lymphs Abs 2.9  0.7 - 4.0 K/uL    Monocytes Relative 7  3 - 12 %   Monocytes Absolute 0.8  0.1 - 1.0 K/uL   Eosinophils Relative 4  0 - 5 %   Eosinophils Absolute 0.4  0.0 - 0.7 K/uL   Basophils Relative 0  0 - 1 %   Basophils Absolute 0.0  0.0 - 0.1 K/uL  BASIC METABOLIC PANEL      Component Value Range   Sodium 133 (*) 135 - 145 mEq/L   Potassium 4.3  3.5 - 5.1 mEq/L   Chloride 99  96 - 112 mEq/L   CO2 23  19 - 32 mEq/L   Glucose, Bld 377 (*) 70 - 99 mg/dL   BUN 18  6 - 23 mg/dL   Creatinine, Ser 4.09  0.50 - 1.35 mg/dL   Calcium 8.9  8.4 - 81.1 mg/dL   GFR calc non Af Amer 63 (*) >90 mL/min   GFR calc Af Amer 73 (*) >90 mL/min  GLUCOSE, CAPILLARY      Component Value Range   Glucose-Capillary 392 (*) 70 - 99 mg/dL   Comment 1 Documented in Chart     Comment 2 Notify RN    GLUCOSE, CAPILLARY      Component Value Range   Glucose-Capillary 247 (*) 70 - 99 mg/dL   Comment 1 Notify RN     Comment 2 Documented in Chart         Dg Chest 2 View  01/12/2012  *RADIOLOGY REPORT*  Clinical Data: Chest congestion.  CHEST - 2 VIEW  Comparison: 12/15/2011.  Findings: Stable enlarged cardiac silhouette and tortuous aorta. Stable mild prominence of the interstitial markings.  Mild thoracic spine degenerative changes.  IMPRESSION:  1.  No acute abnormality. 2.  Stable mild cardiomegaly and mild chronic interstitial lung disease.   Original Report Authenticated By: Beckie Salts, M.D.      1. Anxiety attack   2. Cocaine abuse with cocaine-induced anxiety disorder   3. Depression with suicidal ideation   4. Insomnia, unspecified   5. Polysubstance abuse       MDM  11:25 PM patient seen and evaluated. Patient well-appearing at this time without significant complaints. He is awake and alert x3. Patient reports mild drowsiness but does state he received medications for his panic attacks recently.   Patient sugar has finally reached to 247. This time we'll discharge back to [facility].    Phill Mutter Sammy Martinez, [place] 01/12/12  [phone]

## 2012-01-11 NOTE — Progress Notes (Addendum)
D Pt is seen OOB UAL on the 500 hall today...he tolerates this well. HE is quiet, he is compliant with his meds and he attends his groups. HE had trouble staying awake this morning in [employer] ( he says his medicine is making him very sleepy now).   A He has received his insulin ( meal coverage and sliding scale) per MD order, as well as his diabetic snack, tolerated well. His affect is sad, depressed and flat.he does make eye contact, he contracts for safety and slowly..he is coming out of his room .   R POC in place and safety maintained.

## 2012-01-11 NOTE — Progress Notes (Signed)
[facility] MD Progress Note  01/11/2012 1:26 PM [name] [name].  MRN:  [id number] Subjective:  Headache--just took medicine for it  Diagnosis:   Axis I: Anxiety Disorder NOS, Depressive Disorder NOS and Substance Abuse Axis II: Deferred Axis III:  Past Medical History  Diagnosis Date  . Hypertension   . Diabetes mellitus   . GERD (gastroesophageal reflux disease)   . Pericarditis   . Polysubstance abuse     cocaine, marijuana  . Auditory hallucinations   . Depression   . Hx of medication noncompliance   . History of suicide attempt    Axis IV: economic problems, other psychosocial or environmental problems, problems related to social environment and problems with primary support group Axis V: 31-40 impairment in reality testing  ADL's:  Intact  Sleep: Good  Appetite:  Good  Suicidal Ideation:  Denies Homicidal Ideation:  Denies  Psychiatric Specialty Exam: ROS:  Headache, just took PRN medication for this  Blood pressure 183/125, pulse 81, temperature 98.4 F (36.9 C), temperature source Oral, resp. rate 18, height 5\' 9"  (1.753 m), weight 118.842 kg (262 lb).Body mass index is 38.69 kg/(m^2).  General Appearance: Casual  Eye Contact::  Fair  Speech:  Normal Rate  Volume:  Normal  Mood:  Depressed  Affect:  Depressed  Thought Process:  Logical  Orientation:  Full (Time, Place, and Person)  Thought Content:  NA  Suicidal Thoughts:  No  Homicidal Thoughts:  No  Memory:  Immediate;   Fair Recent;   Fair Remote;   Fair  Judgement:  Fair  Insight:  Fair  Psychomotor Activity:  Decreased  Concentration:  Fair  Recall:  Fair  Akathisia:  No  Handed:  Right  AIMS (if indicated):     Assets:  Desire for Improvement  Sleep:  Number of Hours: 6.75    Current Medications: Current Facility-Administered Medications  Medication Dose Route Frequency Provider Last Rate Last Dose  . acetaminophen (TYLENOL) tablet 650 mg  650 mg Oral Q6H PRN Sanjuana Kava, NP   650 mg at  01/11/12 1208  . alum & mag hydroxide-simeth (MAALOX/MYLANTA) 200-200-20 MG/5ML suspension 30 mL  30 mL Oral Q4H PRN Sanjuana Kava, NP   30 mL at 01/10/12 1858  . amLODipine (NORVASC) tablet 5 mg  5 mg Oral Daily Sanjuana Kava, NP   5 mg at 01/11/12 0839  . cloNIDine (CATAPRES) tablet 0.3 mg  0.3 mg Oral BID Sanjuana Kava, NP   0.3 mg at 01/11/12 0839  . FLUoxetine (PROZAC) capsule 10 mg  10 mg Oral Daily Himabindu Ravi, MD   10 mg at 01/11/12 0839  . hydrOXYzine (ATARAX/VISTARIL) tablet 25 mg  25 mg Oral TID PRN Patrick North, MD   25 mg at 01/10/12 2057  . insulin aspart (novoLOG) injection 0-15 Units  0-15 Units Subcutaneous TID WC Sanjuana Kava, NP   8 Units at 01/11/12 1206  . [COMPLETED] insulin aspart (novoLOG) injection 15 Units  15 Units Subcutaneous Once Sanjuana Kava, NP   15 Units at 01/10/12 2243  . insulin aspart (novoLOG) injection 4 Units  4 Units Subcutaneous TID WC Sanjuana Kava, NP   4 Units at 01/11/12 1206  . insulin glargine (LANTUS) injection 20 Units  20 Units Subcutaneous QHS Sanjuana Kava, NP   20 Units at 01/10/12 2244  . lisinopril (PRINIVIL,ZESTRIL) tablet 20 mg  20 mg Oral BID Sanjuana Kava, NP   20 mg at 01/11/12 0839  .  magnesium hydroxide (MILK OF MAGNESIA) suspension 30 mL  30 mL Oral Daily PRN Sanjuana Kava, NP      . traZODone (DESYREL) tablet 50 mg  50 mg Oral QHS Sanjuana Kava, NP   50 mg at 01/10/12 2245    Lab Results:  Results for orders placed during the hospital encounter of 01/09/12 (from the past 48 hour(s))  GLUCOSE, CAPILLARY     Status: Abnormal   Collection Time   01/09/12  5:12 PM      Component Value Range Comment   Glucose-Capillary 340 (*) 70 - 99 mg/dL    Comment 1 Notify RN     HEMOGLOBIN A1C     Status: Abnormal   Collection Time   01/09/12  8:16 PM      Component Value Range Comment   Hemoglobin A1C 10.8 (*) <5.7 %    Mean Plasma Glucose 263 (*) <117 mg/dL   GLUCOSE, CAPILLARY     Status: Abnormal   Collection Time   01/09/12  9:35  PM      Component Value Range Comment   Glucose-Capillary 358 (*) 70 - 99 mg/dL    Comment 1 Notify RN     GLUCOSE, CAPILLARY     Status: Abnormal   Collection Time   01/10/12  6:10 AM      Component Value Range Comment   Glucose-Capillary 277 (*) 70 - 99 mg/dL   GLUCOSE, CAPILLARY     Status: Abnormal   Collection Time   01/10/12 12:03 PM      Component Value Range Comment   Glucose-Capillary 460 (*) 70 - 99 mg/dL    Comment 1 Documented in Chart      Comment 2 Notify RN     GLUCOSE, CAPILLARY     Status: Abnormal   Collection Time   01/10/12  4:35 PM      Component Value Range Comment   Glucose-Capillary 340 (*) 70 - 99 mg/dL    Comment 1 Notify RN     GLUCOSE, CAPILLARY     Status: Abnormal   Collection Time   01/10/12  9:16 PM      Component Value Range Comment   Glucose-Capillary 444 (*) 70 - 99 mg/dL    Comment 1 Notify RN     GLUCOSE, CAPILLARY     Status: Abnormal   Collection Time   01/10/12 11:55 PM      Component Value Range Comment   Glucose-Capillary 336 (*) 70 - 99 mg/dL    Comment 1 Notify RN     GLUCOSE, CAPILLARY     Status: Abnormal   Collection Time   01/11/12  3:16 AM      Component Value Range Comment   Glucose-Capillary 284 (*) 70 - 99 mg/dL   GLUCOSE, CAPILLARY     Status: Abnormal   Collection Time   01/11/12  6:39 AM      Component Value Range Comment   Glucose-Capillary 270 (*) 70 - 99 mg/dL   GLUCOSE, CAPILLARY     Status: Abnormal   Collection Time   01/11/12 11:54 AM      Component Value Range Comment   Glucose-Capillary 287 (*) 70 - 99 mg/dL     Physical Findings: AIMS: Facial and Oral Movements Muscles of Facial Expression: None, normal Lips and Perioral Area: None, normal Jaw: None, normal Tongue: None, normal,Extremity Movements Upper (arms, wrists, hands, fingers): None, normal Lower (legs, knees, ankles, toes): None, normal, Trunk Movements  Neck, shoulders, hips: None, normal, Overall Severity Severity of abnormal movements  (highest score from questions above): None, normal Incapacitation due to abnormal movements: None, normal Patient's awareness of abnormal movements (rate only patient's report): No Awareness, Dental Status Current problems with teeth and/or dentures?: No Does patient usually wear dentures?: No  CIWA:    COWS:     Treatment Plan Summary: Daily contact with patient to assess and evaluate symptoms and progress in treatment Medication management  Plan:  Patient had a headache on assessment, just took PRN medication for it, resting quietly at this time, depressed affect and mood, reported sleep and appetite good, denies suicidal and homicidal thoughts, denies hallucinations, monitoring continues.  Medical Decision Making Problem Points:  Established problem, stable/improving (1) Data Points:  Review of medication regiment & side effects (2)  I certify that inpatient services furnished can reasonably be expected to improve the patient's condition.   Nanine Means, PMH-NP 01/11/2012, 1:26 PM

## 2012-01-11 NOTE — Progress Notes (Addendum)
Patient ID: [name] [name]., male   DOB: 1960/05/11, 51 y.o.   MRN: [id number] D)  Pt has been up and about, attended group.  CBG was checked after evening activities, CBG=535, BP177/107-77.  Dr Theotis Barrio was notified, requested hospitalist to see.  Spoke with Company secretary in [place], hospitalist notified, pt being transferred to [facility] for eval.  He has notifed his family members of transfer.  Addendum:  Returned to floor,  Report from ER indicated pt had received 10 units novalog as well as a liter of .9 NS, CBG down to 242.  Also c/o headache while there and nasal stuffiness and he received toradol 30 mg IV as well as afrin nasal spray.  Stated feeling better, went to his room and went tobed.

## 2012-01-12 LAB — CBC WITH DIFFERENTIAL/PLATELET
Basophils Absolute: 0 10*3/uL (ref 0.0–0.1)
Eosinophils Absolute: 0.4 10*3/uL (ref 0.0–0.7)
Eosinophils Relative: 4 % (ref 0–5)
MCH: 30.6 pg (ref 26.0–34.0)
MCHC: 35.6 g/dL (ref 30.0–36.0)
MCV: 86 fL (ref 78.0–100.0)
Platelets: 193 10*3/uL (ref 150–400)
RDW: 12.1 % (ref 11.5–15.5)

## 2012-01-12 LAB — GLUCOSE, CAPILLARY
Glucose-Capillary: 292 mg/dL — ABNORMAL HIGH (ref 70–99)
Glucose-Capillary: 392 mg/dL — ABNORMAL HIGH (ref 70–99)

## 2012-01-12 LAB — BASIC METABOLIC PANEL
Calcium: 8.9 mg/dL (ref 8.4–10.5)
Creatinine, Ser: 1.29 mg/dL (ref 0.50–1.35)
GFR calc non Af Amer: 63 mL/min — ABNORMAL LOW (ref >90)
Glucose, Bld: 377 mg/dL — ABNORMAL HIGH (ref 70–99)
Sodium: 133 mEq/L — ABNORMAL LOW (ref 135–145)

## 2012-01-12 MED ORDER — AZELASTINE HCL 0.1 % NA SOLN
2.0000 | Freq: Two times a day (BID) | NASAL | Status: DC
  Administered 2012-01-12 – 2012-01-15 (×5): 2 via NASAL
  Filled 2012-01-12: qty 30

## 2012-01-12 MED ORDER — OXYMETAZOLINE HCL 0.05 % NA SOLN
1.0000 | Freq: Once | NASAL | Status: AC
  Administered 2012-01-12: 1 via NASAL
  Filled 2012-01-12: qty 15

## 2012-01-12 MED ORDER — INSULIN ASPART 100 UNIT/ML ~~LOC~~ SOLN
18.0000 [IU] | Freq: Once | SUBCUTANEOUS | Status: AC
  Administered 2012-01-12: 18 [IU] via SUBCUTANEOUS

## 2012-01-12 MED ORDER — HYDROCHLOROTHIAZIDE 25 MG PO TABS
25.0000 mg | ORAL_TABLET | Freq: Every day | ORAL | Status: DC
  Administered 2012-01-12 – 2012-01-15 (×4): 25 mg via ORAL
  Filled 2012-01-12 (×6): qty 1

## 2012-01-12 MED ORDER — INSULIN GLARGINE 100 UNIT/ML ~~LOC~~ SOLN
25.0000 [IU] | Freq: Every day | SUBCUTANEOUS | Status: DC
  Administered 2012-01-12 – 2012-01-13 (×2): 25 [IU] via SUBCUTANEOUS

## 2012-01-12 MED ORDER — INSULIN ASPART 100 UNIT/ML ~~LOC~~ SOLN
10.0000 [IU] | Freq: Once | SUBCUTANEOUS | Status: AC
  Administered 2012-01-12: 10 [IU] via SUBCUTANEOUS
  Filled 2012-01-12: qty 1

## 2012-01-12 MED ORDER — INSULIN REGULAR HUMAN 100 UNIT/ML IJ SOLN: 10.0000 [IU] | Freq: Once | INTRAMUSCULAR | Status: DC

## 2012-01-12 MED ORDER — SODIUM CHLORIDE 0.9 % IV BOLUS (SEPSIS)
1000.0000 mL | Freq: Once | INTRAVENOUS | Status: DC
  Filled 2012-01-12: qty 1000

## 2012-01-12 MED ORDER — SODIUM CHLORIDE 0.9 % IV BOLUS (SEPSIS)
1000.0000 mL | Freq: Once | INTRAVENOUS | Status: AC
  Administered 2012-01-12: 1000 mL via INTRAVENOUS

## 2012-01-12 MED ORDER — KETOROLAC TROMETHAMINE 30 MG/ML IJ SOLN
30.0000 mg | Freq: Once | INTRAMUSCULAR | Status: AC
  Administered 2012-01-12: 30 mg via INTRAVENOUS
  Filled 2012-01-12: qty 1

## 2012-01-12 MED ORDER — HYDROXYZINE HCL 50 MG PO TABS
50.0000 mg | ORAL_TABLET | Freq: Once | ORAL | Status: AC
  Administered 2012-01-12: 50 mg via ORAL

## 2012-01-12 MED ORDER — INSULIN ASPART 100 UNIT/ML ~~LOC~~ SOLN: 10.0000 [IU] | Freq: Once | SUBCUTANEOUS | Status: DC

## 2012-01-12 NOTE — Progress Notes (Signed)
Goals Group Note  Date:  12/7//2013 Time: 0900  Group Topic/Focus:  Identifying  Goals: The focus of this group is to help the patient identify goals they  Participation Level:  active Participation Quality: good Affect: flat Cognitive:    Insight:  good  Engagement in Group: engaged  Additional Comments: Pt was engaged in group, asked appropriate questions, shared personal life experience with the group and demonstrates willingness to process and understand her problems. PDuke RN [facility]

## 2012-01-12 NOTE — ED Notes (Signed)
Patient returned from X-ray 

## 2012-01-12 NOTE — ED Provider Notes (Signed)
Medical screening examination/treatment/procedure(s) were performed by non-physician practitioner and as supervising physician I was immediately available for consultation/collaboration.   Rolan Bucco, MD 01/12/12 [phone]

## 2012-01-12 NOTE — ED Notes (Signed)
Pt sent from [facility]  States he hasn't been feeling well and his "sugar" was high

## 2012-01-12 NOTE — Discharge Instructions (Signed)
You were seen and treated for your elevated blood sugar. This was treated with insulin and IV fluids in the emergency room. Your lab tests and chest x-ray have not shown any other signs for concerning or emergent condition. At this time your providers feel you may return to behavioral health Hospital for your treatment of anxiety and bipolar disorder. Please continue to use your medications as instructed and followup with your primary care provider.    Hyperglycemia Hyperglycemia occurs when the glucose (sugar) in your blood is too high. Hyperglycemia can happen for many reasons, but it most often happens to people who do not know they have diabetes or are not managing their diabetes properly.  CAUSES  Whether you have diabetes or not, there are other causes of hyperglycemia. Hyperglycemia can occur when you have diabetes, but it can also occur in other situations that you might not be as aware of, such as: Diabetes  If you have diabetes and are having problems controlling your blood glucose, hyperglycemia could occur because of some of the following reasons:  Not following your meal plan.  Not taking your diabetes medications or not taking it properly.  Exercising less or doing less activity than you normally do.  Being sick. Pre-diabetes  This cannot be ignored. Before people develop Type 2 diabetes, they almost always have "pre-diabetes." This is when your blood glucose levels are higher than normal, but not yet high enough to be diagnosed as diabetes. Research has shown that some long-term damage to the body, especially the heart and circulatory system, may already be occurring during pre-diabetes. If you take action to manage your blood glucose when you have pre-diabetes, you may delay or prevent Type 2 diabetes from developing. Stress  If you have diabetes, you may be "diet" controlled or on oral medications or insulin to control your diabetes. However, you may find that your blood  glucose is higher than usual in the hospital whether you have diabetes or not. This is often referred to as "stress hyperglycemia." Stress can elevate your blood glucose. This happens because of hormones put out by the body during times of stress. If stress has been the cause of your high blood glucose, it can be followed regularly by your caregiver. That way he/she can make sure your hyperglycemia does not continue to get worse or progress to diabetes. Steroids  Steroids are medications that act on the infection fighting system (immune system) to block inflammation or infection. One side effect can be a rise in blood glucose. Most people can produce enough extra insulin to allow for this rise, but for those who cannot, steroids make blood glucose levels go even higher. It is not unusual for steroid treatments to "uncover" diabetes that is developing. It is not always possible to determine if the hyperglycemia will go away after the steroids are stopped. A special blood test called an A1c is sometimes done to determine if your blood glucose was elevated before the steroids were started. SYMPTOMS  Thirsty.  Frequent urination.  Dry mouth.  Blurred vision.  Tired or fatigue.  Weakness.  Sleepy.  Tingling in feet or leg. DIAGNOSIS  Diagnosis is made by monitoring blood glucose in one or all of the following ways:  A1c test. This is a chemical found in your blood.  Fingerstick blood glucose monitoring.  Laboratory results. TREATMENT  First, knowing the cause of the hyperglycemia is important before the hyperglycemia can be treated. Treatment may include, but is not be limited to:  Education.  Change or adjustment in medications.  Change or adjustment in meal plan.  Treatment for an illness, infection, etc.  More frequent blood glucose monitoring.  Change in exercise plan.  Decreasing or stopping steroids.  Lifestyle changes. HOME CARE INSTRUCTIONS   Test your blood  glucose as directed.  Exercise regularly. Your caregiver will give you instructions about exercise. Pre-diabetes or diabetes which comes on with stress is helped by exercising.  Eat wholesome, balanced meals. Eat often and at regular, fixed times. Your caregiver or nutritionist will give you a meal plan to guide your sugar intake.  Being at an ideal weight is important. If needed, losing as little as 10 to 15 pounds may help improve blood glucose levels. SEEK MEDICAL CARE IF:   You have questions about medicine, activity, or diet.  You continue to have symptoms (problems such as increased thirst, urination, or weight gain). SEEK IMMEDIATE MEDICAL CARE IF:   You are vomiting or have diarrhea.  Your breath smells fruity.  You are breathing faster or slower.  You are very sleepy or incoherent.  You have numbness, tingling, or pain in your feet or hands.  You have chest pain.  Your symptoms get worse even though you have been following your caregiver's orders.  If you have any other questions or concerns. Document Released: 07/17/2000 Document Revised: 04/15/2011 Document Reviewed: 05/20/2011 [facility] Patient Information 2013 [place], [place].

## 2012-01-12 NOTE — Progress Notes (Signed)
D Birch has had a hard day today. HE started the day off...feeling so anxious he was unable to lie down and rest, because he was so aware of his anxiety. He was assessed by the NP here and he received a one-time dose of vistaril  50 mg, with little relief noted afterwards. He has been in and out of his bed and his room all day long.  He was started on HCTZ 25 mg, to assist getting his BP  Under control. He stated he has flet SOB " off and on". He requested his vistaril again at 1514 and was given 25 mg PO, with again, only slight relief noted by pt. At 1626 he was given 2 tylelnol, for c/o headache, for which he stated relief afterwards.   A At 1715, his cbg was noted to be 539. PA on call ( AW) was notified, pt was given 22 units of novolog ( sliding scale ) 18, per AW's order coupled with 4 u of meal coverage. Mr. Elmon Kirschner increased the lantus insulin ( to be given at [facility] tonight ) to 25 units and request nursing recheck CBG 2 hrs after pt eats ( at 2000) and notify him with result.   R POC and safety maintaiend.

## 2012-01-12 NOTE — Progress Notes (Signed)
Goals Group Note  Date:  12/7//2013 Time: 0900  Group Topic/Focus:  Identifying  Goals : The focus of this group is to help patients identify  Goals they want to achieve, to introduce them to the Saturday Patient Workbook and to motivate them to begin to take the steps they need to take..influenza order to become healthier individuals.  Participation Level:  Active  Participation Quality: good Affect: flat  Cognitive:good    Insight:  good  Engagement in Group: engaged  Additional Comments:    PD RN BC Late entry 01/11/2013

## 2012-01-12 NOTE — Clinical Social Work Note (Signed)
[facility] Group Notes:  (Clinical Social Work)  01/12/2012   3:00-4:00PM  Summary of Progress/Problems:   The main focus of today's process group was for the patient to define "support" and describe what healthy supports are, then to identify the patient's current support system and decide on other supports that can be put in place to prevent future hospitalizations.  An emphasis was placed on using therapist, doctor and problem-specific support groups to expand supports. The patient expressed nothing on this topic, as he kept falling asleep and eventually went to his room.  Type of Therapy:  Process Group  Participation Level:  None  Participation Quality:  Drowsy  Affect:  Depressed  Cognitive:  Unknown, no interaction  Insight:  Unknown, no interaction  Engagement in Therapy:  None  Modes of Intervention:  Clarification, Education, Limit-setting, Problem-solving, Socialization, Support and Processing, Exploration, Discussion   Ambrose Mantle, LCSW 01/12/2012, 5:49 PM

## 2012-01-12 NOTE — Progress Notes (Signed)
[facility] MD Progress Note  01/12/2012 9:37 AM [name] [name].  MRN:  [id number] Subjective:  Patient complaining of anxiety Diagnosis:   Axis I: Anxiety Disorder NOS, Depressive Disorder NOS, Substance Abuse and Substance Induced Mood Disorder Axis II: Deferred Axis III:  Past Medical History  Diagnosis Date  . Hypertension   . Diabetes mellitus   . GERD (gastroesophageal reflux disease)   . Pericarditis   . Polysubstance abuse     cocaine, marijuana  . Auditory hallucinations   . Depression   . Hx of medication noncompliance   . History of suicide attempt    Axis IV: economic problems, other psychosocial or environmental problems, problems related to social environment and problems with primary support group Axis V: 41-50 serious symptoms  ADL's:  Intact  Sleep: Fair  Appetite:  Good  Suicidal Ideation:  Denies Homicidal Ideation:  Denies  Psychiatric Specialty Exam: ROS:  Anxiety  Blood pressure 163/103, pulse 72, temperature 97.6 F (36.4 C), temperature source Oral, resp. rate 18, height 5\' 9"  (1.753 m), weight 118.842 kg (262 lb), SpO2 97.00%.Body mass index is 38.69 kg/(m^2).  General Appearance: Casual  Eye Contact::  Fair  Speech:  NA  Volume:  Normal  Mood:  Anxious  Affect:  Congruent  Thought Process:  Logical  Orientation:  Full (Time, Place, and Person)  Thought Content:  WDL  Suicidal Thoughts:  No  Homicidal Thoughts:  No  Memory:  Immediate;   Fair Recent;   Fair Remote;   Fair  Judgement:  Fair  Insight:  Fair  Psychomotor Activity:  Decreased  Concentration:  Fair  Recall:  Fair  Akathisia:  NA  Handed:  Right  AIMS (if indicated):     Assets:  Communication Skills Desire for Improvement  Sleep:  Number of Hours: 1    Current Medications: Current Facility-Administered Medications  Medication Dose Route Frequency Provider Last Rate Last Dose  . acetaminophen (TYLENOL) tablet 650 mg  650 mg Oral Q6H PRN Sanjuana Kava, NP   650 mg at  01/11/12 2037  . alum & mag hydroxide-simeth (MAALOX/MYLANTA) 200-200-20 MG/5ML suspension 30 mL  30 mL Oral Q4H PRN Sanjuana Kava, NP   30 mL at 01/10/12 1858  . amLODipine (NORVASC) tablet 5 mg  5 mg Oral Daily Sanjuana Kava, NP   5 mg at 01/12/12 0802  . cloNIDine (CATAPRES) tablet 0.3 mg  0.3 mg Oral BID Sanjuana Kava, NP   0.3 mg at 01/12/12 0802  . FLUoxetine (PROZAC) capsule 10 mg  10 mg Oral Daily Himabindu Ravi, MD   10 mg at 01/12/12 0801  . hydrochlorothiazide (HYDRODIURIL) tablet 25 mg  25 mg Oral Daily Nanine Means, NP   25 mg at 01/12/12 0909  . hydrOXYzine (ATARAX/VISTARIL) tablet 25 mg  25 mg Oral TID PRN Himabindu Ravi, MD   25 mg at 01/12/12 4098  . hydrOXYzine (ATARAX/VISTARIL) tablet 50 mg  50 mg Oral Once Nanine Means, NP      . insulin aspart (novoLOG) injection 0-15 Units  0-15 Units Subcutaneous TID WC Sanjuana Kava, NP   8 Units at 01/12/12 [phone]  . [COMPLETED] insulin aspart (novoLOG) injection 10 Units  10 Units Subcutaneous Once Rolan Bucco, MD   10 Units at 01/12/12 0045  . insulin aspart (novoLOG) injection 10 Units  10 Units Subcutaneous Once Rolan Bucco, MD      . [COMPLETED] insulin aspart (novoLOG) injection 15 Units  15 Units Subcutaneous Once [employer],  MD   15 Units at 01/11/12 2237  . insulin aspart (novoLOG) injection 4 Units  4 Units Subcutaneous TID WC Sanjuana Kava, NP   4 Units at 01/12/12 [phone]  . insulin glargine (LANTUS) injection 20 Units  20 Units Subcutaneous QHS Sanjuana Kava, NP   20 Units at 01/11/12 2226  . [COMPLETED] ketorolac (TORADOL) 30 MG/ML injection 30 mg  30 mg Intravenous Once [employer], PA   30 mg at 01/12/12 0245  . lisinopril (PRINIVIL,ZESTRIL) tablet 20 mg  20 mg Oral BID Sanjuana Kava, NP   20 mg at 01/12/12 0801  . magnesium hydroxide (MILK OF MAGNESIA) suspension 30 mL  30 mL Oral Daily PRN Sanjuana Kava, NP      . [COMPLETED] oxymetazoline (AFRIN) 0.05 % nasal spray 1 spray  1 spray Each Nare Once Angus Seller, PA   1  spray at 01/12/12 0245  . [COMPLETED] sodium chloride 0.9 % bolus 1,000 mL  1,000 mL Intravenous Once Angus Seller, PA 999 mL/hr at 01/11/12 2350 1,000 mL at 01/11/12 2350  . [COMPLETED] sodium chloride 0.9 % bolus 1,000 mL  1,000 mL Intravenous Once Angus Seller, PA   1,000 mL at 01/12/12 0217  . sodium chloride 0.9 % bolus 1,000 mL  1,000 mL Intravenous Once Angus Seller, PA      . traZODone (DESYREL) tablet 50 mg  50 mg Oral QHS Sanjuana Kava, NP   50 mg at 01/10/12 2245  . [DISCONTINUED] insulin regular (NOVOLIN R,HUMULIN R) 100 units/mL injection 10 Units  10 Units Subcutaneous Once Angus Seller, PA      . [DISCONTINUED] insulin regular (NOVOLIN R,HUMULIN R) 100 units/mL injection 10 Units  10 Units Subcutaneous Once Angus Seller, PA        Lab Results:  Results for orders placed during the hospital encounter of 01/09/12 (from the past 48 hour(s))  GLUCOSE, CAPILLARY     Status: Abnormal   Collection Time   01/10/12 12:03 PM      Component Value Range Comment   Glucose-Capillary 460 (*) 70 - 99 mg/dL    Comment 1 Documented in Chart      Comment 2 Notify RN     GLUCOSE, CAPILLARY     Status: Abnormal   Collection Time   01/10/12  4:35 PM      Component Value Range Comment   Glucose-Capillary 340 (*) 70 - 99 mg/dL    Comment 1 Notify RN     GLUCOSE, CAPILLARY     Status: Abnormal   Collection Time   01/10/12  9:16 PM      Component Value Range Comment   Glucose-Capillary 444 (*) 70 - 99 mg/dL    Comment 1 Notify RN     GLUCOSE, CAPILLARY     Status: Abnormal   Collection Time   01/10/12 11:55 PM      Component Value Range Comment   Glucose-Capillary 336 (*) 70 - 99 mg/dL    Comment 1 Notify RN     GLUCOSE, CAPILLARY     Status: Abnormal   Collection Time   01/11/12  3:16 AM      Component Value Range Comment   Glucose-Capillary 284 (*) 70 - 99 mg/dL   GLUCOSE, CAPILLARY     Status: Abnormal   Collection Time   01/11/12  6:39 AM      Component Value Range Comment    Glucose-Capillary 270 (*)  70 - 99 mg/dL   GLUCOSE, CAPILLARY     Status: Abnormal   Collection Time   01/11/12 11:54 AM      Component Value Range Comment   Glucose-Capillary 287 (*) 70 - 99 mg/dL   GLUCOSE, CAPILLARY     Status: Abnormal   Collection Time   01/11/12  5:29 PM      Component Value Range Comment   Glucose-Capillary 320 (*) 70 - 99 mg/dL   CBC WITH DIFFERENTIAL     Status: Abnormal   Collection Time   01/11/12 11:50 PM      Component Value Range Comment   WBC 11.5 (*) 4.0 - 10.5 K/uL    RBC 4.80  4.22 - 5.81 MIL/uL    Hemoglobin 14.7  13.0 - 17.0 g/dL    HCT 16.1  09.6 - 04.5 %    MCV 86.0  78.0 - 100.0 fL    MCH 30.6  26.0 - 34.0 pg    MCHC 35.6  30.0 - 36.0 g/dL    RDW 40.9  81.1 - 91.4 %    Platelets 193  150 - 400 K/uL    Neutrophils Relative 65  43 - 77 %    Neutro Abs 7.5  1.7 - 7.7 K/uL    Lymphocytes Relative 25  12 - 46 %    Lymphs Abs 2.9  0.7 - 4.0 K/uL    Monocytes Relative 7  3 - 12 %    Monocytes Absolute 0.8  0.1 - 1.0 K/uL    Eosinophils Relative 4  0 - 5 %    Eosinophils Absolute 0.4  0.0 - 0.7 K/uL    Basophils Relative 0  0 - 1 %    Basophils Absolute 0.0  0.0 - 0.1 K/uL   BASIC METABOLIC PANEL     Status: Abnormal   Collection Time   01/11/12 11:50 PM      Component Value Range Comment   Sodium 133 (*) 135 - 145 mEq/L    Potassium 4.3  3.5 - 5.1 mEq/L    Chloride 99  96 - 112 mEq/L    CO2 23  19 - 32 mEq/L    Glucose, Bld 377 (*) 70 - 99 mg/dL    BUN 18  6 - 23 mg/dL    Creatinine, Ser 7.82  0.50 - 1.35 mg/dL    Calcium 8.9  8.4 - 95.6 mg/dL    GFR calc non Af Amer 63 (*) >90 mL/min    GFR calc Af Amer 73 (*) >90 mL/min   GLUCOSE, CAPILLARY     Status: Abnormal   Collection Time   01/12/12 12:25 AM      Component Value Range Comment   Glucose-Capillary 392 (*) 70 - 99 mg/dL    Comment 1 Documented in Chart      Comment 2 Notify RN     GLUCOSE, CAPILLARY     Status: Abnormal   Collection Time   01/12/12  2:13 AM      Component Value  Range Comment   Glucose-Capillary 247 (*) 70 - 99 mg/dL    Comment 1 Notify RN      Comment 2 Documented in Chart     GLUCOSE, CAPILLARY     Status: Abnormal   Collection Time   01/12/12  6:08 AM      Component Value Range Comment   Glucose-Capillary 267 (*) 70 - 99 mg/dL    Comment 1 Notify RN  Physical Findings: AIMS: Facial and Oral Movements Muscles of Facial Expression: None, normal Lips and Perioral Area: None, normal Jaw: None, normal Tongue: None, normal,Extremity Movements Upper (arms, wrists, hands, fingers): None, normal Lower (legs, knees, ankles, toes): None, normal, Trunk Movements Neck, shoulders, hips: None, normal, Overall Severity Severity of abnormal movements (highest score from questions above): None, normal Incapacitation due to abnormal movements: None, normal Patient's awareness of abnormal movements (rate only patient's report): No Awareness, Dental Status Current problems with teeth and/or dentures?: No Does patient usually wear dentures?: No  CIWA:    COWS:     Treatment Plan Summary: Daily contact with patient to assess and evaluate symptoms and progress in treatment Medication management  Plan:  Vistaril 50 mg ordered once for anxiety, vital signs WNL except blood pressure remains elevated--HCTZ 25 mg given about 10 minutes ago and BP will be reassessed, breathing/calming techniques taught and patient is practicing at this time, denies suicidal/homicidal ideations and hallucinations, denies pain, was in the ED last night for elevated blood glucose levels--regardless of lack of sleep, patient states he does not feel tired, affect congruent with his anxious state, no agitation noted, close monitoring continues.  Medical Decision Making Problem Points:  Established problem, worsening (2) Data Points:  Review of new medications or change in dosage (2)  I certify that inpatient services furnished can reasonably be expected to improve the patient's  condition.   LORD, JAMISON,PMH-NP 01/12/2012, 9:37 AM

## 2012-01-13 LAB — GLUCOSE, CAPILLARY
Glucose-Capillary: 535 mg/dL — ABNORMAL HIGH (ref 70–99)
Glucose-Capillary: 485 mg/dL — ABNORMAL HIGH (ref 70–99)
Glucose-Capillary: 342 mg/dL — ABNORMAL HIGH (ref 70–99)
Glucose-Capillary: 387 mg/dL — ABNORMAL HIGH (ref 70–99)

## 2012-01-13 MED ORDER — INSULIN ASPART 100 UNIT/ML ~~LOC~~ SOLN
8.0000 [IU] | Freq: Three times a day (TID) | SUBCUTANEOUS | Status: DC
  Administered 2012-01-13 – 2012-01-15 (×6): 8 [IU] via SUBCUTANEOUS

## 2012-01-13 MED ORDER — SALINE SPRAY 0.65 % NA SOLN
1.0000 | NASAL | Status: DC | PRN
  Filled 2012-01-13: qty 44

## 2012-01-13 NOTE — Progress Notes (Signed)
[facility] LCSW Group Therapy        Overcoming Obstacles        1:15  01/13/2012 3:42 PM  Type of Therapy:  Group Therapy  Participation Level:  Active  Participation Quality:  Appropriate  Affect:  Appropriate  Cognitive:  Appropriate  Insight:  Engaged  Engagement in Therapy:  Engaged  Modes of Intervention:  Clarification, Education, Problem-solving and Support  Summary of Progress/Problems:  Patient easily engaged during group.  Patient shared his obstacle has been not knowing how to handle himself.  He states he is learning how to control his thoughts.  He also shared another panic has showed him how to stop a panic attack by holding right nostril and breathing through the left nostricl.  Wynn Banker 01/13/2012, 3:42 PM

## 2012-01-13 NOTE — Progress Notes (Addendum)
[facility] MD Progress Note  01/13/2012 2:43 PM [name] [name].  MRN:  [id number] Subjective:  Patient complains of nasal congestion Diagnosis:   Axis I: Depressive Disorder NOS Axis II: Deferred Axis III:  Past Medical History  Diagnosis Date  . Hypertension   . Diabetes mellitus   . GERD (gastroesophageal reflux disease)   . Pericarditis   . Polysubstance abuse     cocaine, marijuana  . Auditory hallucinations   . Depression   . Hx of medication noncompliance   . History of suicide attempt    Axis IV: economic problems, other psychosocial or environmental problems, problems related to social environment and problems with primary support group Axis V: 41-50 serious symptoms  ADL's:  Intact  Sleep: Fair  Appetite:  Good  Suicidal Ideation:  Denies Homicidal Ideation:  Denies  Psychiatric Specialty Exam: Review of Systems  Constitutional: Negative.   HENT: Negative.   Eyes: Negative.   Respiratory:       Complains of nasal congestion  Cardiovascular: Negative.   Gastrointestinal: Negative.   Genitourinary: Negative.   Musculoskeletal: Negative.   Skin: Negative.   Neurological: Negative.   Endo/Heme/Allergies: Negative.   Psychiatric/Behavioral: Positive for depression.    Blood pressure 148/98, pulse 79, temperature 97.4 F (36.3 C), temperature source Oral, resp. rate 16, height 5\' 9"  (1.753 m), weight 118.842 kg (262 lb), SpO2 97.00%.Body mass index is 38.69 kg/(m^2).  General Appearance: Casual  Eye Contact::  Fair  Speech:  Normal Rate  Volume:  Normal  Mood:  Depressed  Affect:  Congruent  Thought Process:  Coherent and Logical  Orientation:  Full (Time, Place, and Person)  Thought Content:  WDL  Suicidal Thoughts:  No  Homicidal Thoughts:  No  Memory:  Immediate;   Fair Recent;   Fair Remote;   Fair  Judgement:  Fair  Insight:  Fair  Psychomotor Activity:  Decreased  Concentration:  Fair  Recall:  Fair  Akathisia:  No  Handed:  Right  AIMS  (if indicated):     Assets:  Communication Skills Desire for Improvement  Sleep:  Number of Hours: 5    Current Medications: Current Facility-Administered Medications  Medication Dose Route Frequency Provider Last Rate Last Dose  . acetaminophen (TYLENOL) tablet 650 mg  650 mg Oral Q6H PRN Sanjuana Kava, NP   650 mg at 01/12/12 1626  . alum & mag hydroxide-simeth (MAALOX/MYLANTA) 200-200-20 MG/5ML suspension 30 mL  30 mL Oral Q4H PRN Sanjuana Kava, NP   30 mL at 01/10/12 1858  . amLODipine (NORVASC) tablet 5 mg  5 mg Oral Daily Sanjuana Kava, NP   5 mg at 01/13/12 0841  . azelastine (ASTELIN) nasal spray 2 spray  2 spray Each Nare BID Jorje Guild, PA-C   2 spray at 01/13/12 [phone]  . cloNIDine (CATAPRES) tablet 0.3 mg  0.3 mg Oral BID Sanjuana Kava, NP   0.3 mg at 01/13/12 0840  . FLUoxetine (PROZAC) capsule 10 mg  10 mg Oral Daily Himabindu Ravi, MD   10 mg at 01/13/12 0839  . hydrochlorothiazide (HYDRODIURIL) tablet 25 mg  25 mg Oral Daily Nanine Means, NP   25 mg at 01/13/12 0840  . hydrOXYzine (ATARAX/VISTARIL) tablet 25 mg  25 mg Oral TID PRN Himabindu Ravi, MD   25 mg at 01/13/12 1441  . insulin aspart (novoLOG) injection 0-15 Units  0-15 Units Subcutaneous TID WC Sanjuana Kava, NP   15 Units at 01/13/12 1159  .  insulin aspart (novoLOG) injection 10 Units  10 Units Subcutaneous Once Rolan Bucco, MD      . [address] insulin aspart (novoLOG) injection 18 Units  18 Units Subcutaneous Once Jorje Guild, PA-C   18 Units at 01/12/12 1745  . insulin aspart (novoLOG) injection 8 Units  8 Units Subcutaneous TID WC Sanjuana Kava, NP      . insulin glargine (LANTUS) injection 25 Units  25 Units Subcutaneous QHS Jorje Guild, PA-C   25 Units at 01/12/12 2100  . lisinopril (PRINIVIL,ZESTRIL) tablet 20 mg  20 mg Oral BID Sanjuana Kava, NP   20 mg at 01/13/12 0839  . magnesium hydroxide (MILK OF MAGNESIA) suspension 30 mL  30 mL Oral Daily PRN Sanjuana Kava, NP      . sodium chloride 0.9 % bolus 1,000 mL   1,000 mL Intravenous Once Angus Seller, PA      . traZODone (DESYREL) tablet 50 mg  50 mg Oral QHS Sanjuana Kava, NP   50 mg at 01/12/12 2228  . [DISCONTINUED] insulin aspart (novoLOG) injection 4 Units  4 Units Subcutaneous TID WC Sanjuana Kava, NP   4 Units at 01/13/12 1159  . [DISCONTINUED] insulin glargine (LANTUS) injection 20 Units  20 Units Subcutaneous QHS Sanjuana Kava, NP   20 Units at 01/11/12 2226    Lab Results:  Results for orders placed during the hospital encounter of 01/09/12 (from the past 48 hour(s))  GLUCOSE, CAPILLARY     Status: Abnormal   Collection Time   01/11/12  5:29 PM      Component Value Range Comment   Glucose-Capillary 320 (*) 70 - 99 mg/dL   GLUCOSE, CAPILLARY     Status: Abnormal   Collection Time   01/11/12  9:25 PM      Component Value Range Comment   Glucose-Capillary 535 (*) 70 - 99 mg/dL    Comment 1 Notify RN     GLUCOSE, CAPILLARY     Status: Abnormal   Collection Time   01/11/12 11:05 PM      Component Value Range Comment   Glucose-Capillary 485 (*) 70 - 99 mg/dL    Comment 1 STAT Lab      Comment 2 Notify RN     CBC WITH DIFFERENTIAL     Status: Abnormal   Collection Time   01/11/12 11:50 PM      Component Value Range Comment   WBC 11.5 (*) 4.0 - 10.5 K/uL    RBC 4.80  4.22 - 5.81 MIL/uL    Hemoglobin 14.7  13.0 - 17.0 g/dL    HCT 40.9  81.1 - 91.4 %    MCV 86.0  78.0 - 100.0 fL    MCH 30.6  26.0 - 34.0 pg    MCHC 35.6  30.0 - 36.0 g/dL    RDW 78.2  95.6 - 21.3 %    Platelets 193  150 - 400 K/uL    Neutrophils Relative 65  43 - 77 %    Neutro Abs 7.5  1.7 - 7.7 K/uL    Lymphocytes Relative 25  12 - 46 %    Lymphs Abs 2.9  0.7 - 4.0 K/uL    Monocytes Relative 7  3 - 12 %    Monocytes Absolute 0.8  0.1 - 1.0 K/uL    Eosinophils Relative 4  0 - 5 %    Eosinophils Absolute 0.4  0.0 - 0.7 K/uL  Basophils Relative 0  0 - 1 %    Basophils Absolute 0.0  0.0 - 0.1 K/uL   BASIC METABOLIC PANEL     Status: Abnormal   Collection Time    01/11/12 11:50 PM      Component Value Range Comment   Sodium 133 (*) 135 - 145 mEq/L    Potassium 4.3  3.5 - 5.1 mEq/L    Chloride 99  96 - 112 mEq/L    CO2 23  19 - 32 mEq/L    Glucose, Bld 377 (*) 70 - 99 mg/dL    BUN 18  6 - 23 mg/dL    Creatinine, Ser 4.09  0.50 - 1.35 mg/dL    Calcium 8.9  8.4 - 81.1 mg/dL    GFR calc non Af Amer 63 (*) >90 mL/min    GFR calc Af Amer 73 (*) >90 mL/min   GLUCOSE, CAPILLARY     Status: Abnormal   Collection Time   01/12/12 12:25 AM      Component Value Range Comment   Glucose-Capillary 392 (*) 70 - 99 mg/dL    Comment 1 Documented in Chart      Comment 2 Notify RN     GLUCOSE, CAPILLARY     Status: Abnormal   Collection Time   01/12/12  2:13 AM      Component Value Range Comment   Glucose-Capillary 247 (*) 70 - 99 mg/dL    Comment 1 Notify RN      Comment 2 Documented in Chart     GLUCOSE, CAPILLARY     Status: Abnormal   Collection Time   01/12/12  6:08 AM      Component Value Range Comment   Glucose-Capillary 267 (*) 70 - 99 mg/dL    Comment 1 Notify RN     GLUCOSE, CAPILLARY     Status: Abnormal   Collection Time   01/12/12 12:01 PM      Component Value Range Comment   Glucose-Capillary 292 (*) 70 - 99 mg/dL    Comment 1 Documented in Chart      Comment 2 Notify RN     GLUCOSE, CAPILLARY     Status: Abnormal   Collection Time   01/12/12  5:11 PM      Component Value Range Comment   Glucose-Capillary 539 (*) 70 - 99 mg/dL    Comment 1 Documented in Chart      Comment 2 Notify RN     GLUCOSE, CAPILLARY     Status: Abnormal   Collection Time   01/12/12  8:00 PM      Component Value Range Comment   Glucose-Capillary 513 (*) 70 - 99 mg/dL    Comment 1 Notify RN     GLUCOSE, CAPILLARY     Status: Abnormal   Collection Time   01/12/12  9:48 PM      Component Value Range Comment   Glucose-Capillary 423 (*) 70 - 99 mg/dL    Comment 1 Notify RN     GLUCOSE, CAPILLARY     Status: Abnormal   Collection Time   01/13/12  6:37 AM       Component Value Range Comment   Glucose-Capillary 342 (*) 70 - 99 mg/dL   GLUCOSE, CAPILLARY     Status: Abnormal   Collection Time   01/13/12 11:49 AM      Component Value Range Comment   Glucose-Capillary 384 (*) 70 - 99 mg/dL  Physical Findings: AIMS: Facial and Oral Movements Muscles of Facial Expression: None, normal Lips and Perioral Area: None, normal Jaw: None, normal Tongue: None, normal,Extremity Movements Upper (arms, wrists, hands, fingers): None, normal Lower (legs, knees, ankles, toes): None, normal, Trunk Movements Neck, shoulders, hips: None, normal, Overall Severity Severity of abnormal movements (highest score from questions above): None, normal Incapacitation due to abnormal movements: None, normal Patient's awareness of abnormal movements (rate only patient's report): No Awareness, Dental Status Current problems with teeth and/or dentures?: No Does patient usually wear dentures?: No  CIWA:    COWS:     Treatment Plan Summary: Daily contact with patient to assess and evaluate symptoms and progress in treatment Medication management  Plan:  Patient reports 3/10 depression with congruent affect, appetite good, sleep fair, concentration fair, denies suicidal/homicidal ideations and auditory/visual hallucinations, meal coverage insulin increased from 4 units to 8 units for better glycemic control, complains of nasal congestion--medication ordered.  Medical Decision Making Problem Points:  Established problem, stable/improving (1) and Review of psycho-social stressors (1) Data Points:  Review of medication regiment & side effects (2)  I certify that inpatient services furnished can reasonably be expected to improve the patient's condition.   Nanine Means, PMH-NP 01/13/2012, 2:43 PM

## 2012-01-13 NOTE — Progress Notes (Signed)
Psychoeducational Group Note  Date:  01/13/2012 Time:  2000  Group Topic/Focus:  Wrap-Up Group:   The focus of this group is to help patients review their daily goal of treatment and discuss progress on daily workbooks.  Participation Level:  Active  Participation Quality:  Appropriate  Affect:  Appropriate  Cognitive:  Appropriate  Insight:  Engaged  Engagement in Group:  Engaged  Additional Comments:    [name] Patience 01/13/2012, 11:15 PM

## 2012-01-13 NOTE — Progress Notes (Signed)
Patient ID: [name] Lim., male   DOB: 1960/09/23, 51 y.o.   MRN: [id number] D)  Cbg following 5 pm coverage  Was done at 8 pm, was 513.  PA notified, no additional coverage ordered at this time.  BP was still elevated per machine, but slightly less manually.  Was given more water and encouraged to try to drink more to dilute his level, and he did this thru group.  Admitted he had had several juices at dinner and thought they were sugar free.  After group bp recheck was 156/110 sitting, 159/108 standing.  Cbg had come down to 423.  Was then given hs lantus 25 units.  Stated was feeling better this evening, asking appropriate questions about what he should and shouldn't eat.  Has been pleasant, cooperative, denied thoughts of self harm. A)  Will continue to monitor q 15 minutes for safety, will continue to monitor cbg's, bp,  Will request diabetic coordinator to see. R)  Appreciative, remains safe on unit.

## 2012-01-13 NOTE — Progress Notes (Signed)
Psychoeducational Group Note  Date:  01/13/2012 Time:  1100  Group Topic/Focus:  Wellness Toolbox:   The focus of this group is to discuss various aspects of wellness, balancing those aspects and exploring ways to increase the ability to experience wellness.  Patients will create a wellness toolbox for use upon discharge.  Participation Level:  Active  Participation Quality:  Appropriate and Attentive  Affect:  Appropriate  Cognitive:  Alert and Appropriate  Insight:  Engaged  Engagement in Group:  Engaged  Additional Comments:  Pt. Shared possible tools to use when dealing with challenges after discharge.   Ruta Hinds [facility] 01/13/2012, 3:07 PMBHH Group Notes:  (Counselor/Nursing/MHT/Case Management/Adjunct)

## 2012-01-13 NOTE — Progress Notes (Signed)
Inpatient Diabetes Program Recommendations  AACE/ADA: New Consensus Statement on Inpatient Glycemic Control (2013)  Target Ranges:  Prepandial:   less than 140 mg/dL      Peak postprandial:   less than 180 mg/dL (1-2 hours)      Critically ill patients:  140 - 180 mg/dL   Reason for Visit: Hyperglycemia  Results for [name] [name], [name] [name] (MRN [id number]) as of 01/13/2012 14:22  Ref. Range 01/12/2012 02:13 01/12/2012 06:08 01/12/2012 12:01 01/12/2012 17:11 01/12/2012 20:00 01/12/2012 21:48 01/13/2012 06:37 01/13/2012 11:49  Glucose-Capillary Latest Range: 70-99 mg/dL 782 (H) 956 (H) 213 (H) 539 (H) 513 (H) 423 (H) 342 (H) 384 (H)  Results for [name] [name], [name] [name] (MRN [id number]) as of 01/13/2012 14:22  Ref. Range 01/09/2012 20:16  Hemoglobin A1C Latest Range: <5.7 % 10.8 (H)    Inpatient Diabetes Program Recommendations Insulin - Basal: Increase Lantus to 32 units QHS Insulin - Meal Coverage: Increase meal coverage insulin to Novolog 8 units tidwc  Note: Pt may benefit from OP Diabetes Education consult for uncontrolled DM.  Will continue to follow.

## 2012-01-13 NOTE — Tx Team (Signed)
Interdisciplinary Treatment Plan Update (Adult)  Date:  01/13/2012  Time Reviewed:  9:41 AM   Progress in Treatment: Attending groups:   Yes   Participating in groups:  Yes Taking medication as prescribed:  Yes Tolerating medication:  Yes Family/Significant othe contact made: Contact to be made with family Patient understands diagnosis:  Yes Discussing patient identified problems/goals with staff: Yes Medical problems stabilized or resolved: Yes Denies suicidal/homicidal ideation:Yes Issues/concerns per patient self-inventory:  Other:   New problem(s) identified:  Reason for Continuation of Hospitalization: Anxiety Depression Medication stabilization Suicidal ideation  Interventions implemented related to continuation of hospitalization:  Medication Management; safety checks q 15 mins  Additional comments:  Estimated length of stay:  2-3 days  Discharge Plan:  Home with outpatient follow up  New goal(s):  Review of initial/current patient goals per problem list:    1.  Goal(s): Eliminate SI/other thoughts of self harm   Met:  Yes  Target date: d/c  As evidenced by: Patient no longer endorsing SI/HI or other thoughts of self harm.    2.  Goal (s):Reduce depression/anxiety (rated at one today  Met: Yes  Target date: d/c  As evidenced by: Patient currently rating symptoms at four or below    3.  Goal(s):.stabilize on meds   Met:  Yes  Target date: d/c  As evidenced by: Patient report being stabilized on medications - less symptomatic    4.  Goal(s): Refer for outpatient follow up   Met:  Yes  Target date: d/c  As evidenced by: Follow up appointment will be scheduled    Attendees: Patient:   01/13/2012 9:41 AM  Physican:  Patrick North, MD 01/13/2012 9:41 AM  Nursing:  Neill Loft, RN 01/13/2012 9:41 AM   Nursing:   Quintella Reichert, RN 01/13/2012 9:41 AM   Clinical Social Worker:  Juline Patch, LCSW 01/13/2012 9:41 AM   Other: Serena Colonel, FNP 01/13/2012 9:41 AM   Other:  Patton Salles, Clinical Social Worker, LCSW  01/13/2012 9:41 AM Other:        01/13/2012 9:41 AM

## 2012-01-13 NOTE — Progress Notes (Signed)
[facility] LCSW Aftercare Discharge Planning Group Note       Discharge Planning Group 8:30-9:30 AM   01/13/2012 3:40 PM  Participation Quality:  Appropriate  Affect:  Appropriate  Cognitive:  Appropriate  Insight:  Developing/Improving  Engagement in Group:  Engaged  Modes of Intervention:  Education, Dentist and Support  Summary of Progress/Problems:  Patient seen during discharge planning group.  He endorses being much improved and rated symptoms at one.  He denies SI/HI.  Patient is uncertain where he will live at discharge but reports having money to find a place and someone helping him look for housing.  Patient reports having an outpatient provider.  He may need assistance with transportation.  Wynn Banker 01/13/2012, 3:40 PM

## 2012-01-13 NOTE — Progress Notes (Signed)
Patient ID: [name] [name]., male   DOB: [birth year], 51 y.o.   MRN: [id number] Patient says he slept fair with medication.  His appetite is improving and He denies SI/Hi.  He rates his depression and hoplessness a 1.  Patient's blood sugar continues to run high.  MD and NP made aware of consistently high CBG'S.  A- Went to cafeteria with patient and monitored his choices for lunch.  Patient says he doesn't know the things he should and should not eat.  He chose mod carb diet with quidance.

## 2012-01-14 LAB — BASIC METABOLIC PANEL
CO2: 26 mEq/L (ref 19–32)
Calcium: 9.7 mg/dL (ref 8.4–10.5)
Creatinine, Ser: 1.29 mg/dL (ref 0.50–1.35)

## 2012-01-14 LAB — GLUCOSE, CAPILLARY
Glucose-Capillary: 282 mg/dL — ABNORMAL HIGH (ref 70–99)
Glucose-Capillary: 411 mg/dL — ABNORMAL HIGH (ref 70–99)
Glucose-Capillary: 357 mg/dL — ABNORMAL HIGH (ref 70–99)

## 2012-01-14 MED ORDER — INSULIN GLARGINE 100 UNIT/ML ~~LOC~~ SOLN
32.0000 [IU] | Freq: Every day | SUBCUTANEOUS | Status: DC
  Administered 2012-01-14: 32 [IU] via SUBCUTANEOUS

## 2012-01-14 MED ORDER — INSULIN ASPART 100 UNIT/ML ~~LOC~~ SOLN
15.0000 [IU] | Freq: Once | SUBCUTANEOUS | Status: AC
  Administered 2012-01-14: 15 [IU] via SUBCUTANEOUS

## 2012-01-14 NOTE — Progress Notes (Signed)
Patient blood sugar was 411 at 1707 and nurse was notified at 1730 and nurse immediately paged the physician on call; patient was asymptomatic; physician gave order to give 15 units of novolog insulin plus give his scheduled 8 units of meal coverage and order a stat bmet; charge nurse notified; patient blood sugar rechecked and it is now 357; still awaiting for stat lab to be done

## 2012-01-14 NOTE — Progress Notes (Signed)
[facility] INPATIENT:  Family/Significant Other Suicide Prevention Education  Suicide Prevention Education:  Education Completed; Elray Mcgregor, Sister, [phone] -Work Phone number,  has been identified by the patient as the family member/significant other with whom the patient will be residing, and identified as the person(s) who will aid the patient in the event of a mental health crisis (suicidal ideations/suicide attempt).  With written consent from the patient, the family member/significant other has been provided the following suicide prevention education, prior to the and/or following the discharge of the patient.  The suicide prevention education provided includes the following:  Suicide risk factors  Suicide prevention and interventions  National Suicide Hotline telephone number  [facility] assessment telephone number  [facility] Emergency Assistance 911  [facility] and/or Residential Mobile Crisis Unit telephone number  Request made of family/significant other to:  Remove weapons (e.g., guns, rifles, knives), all items previously/currently identified as safety concern.  Sister reports there are guns in the home.  Remove drugs/medications (over-the-counter, prescriptions, illicit drugs), all items previously/currently identified as a safety concern.  The family member/significant other verbalizes understanding of the suicide prevention education information provided.  The family member/significant other agrees to remove the items of safety concern listed above.  Wynn Banker 01/14/2012, 11:28 AM

## 2012-01-14 NOTE — Progress Notes (Signed)
Patient ID: [name] Lim., male   DOB: [birth year], 51 y.o.   MRN: [id number] D- Patient left 1300 group and demanded to be discharged as he was able to get a ride to pick him up "Now".  Patient has been concerned about how he will get home to [facility], as he was brought here by ambulance.  He is very frustrated that he is not meeting with any success in getting a ride from [place], and then feels we aren't responsive to discharging him in a timely manner for the ridwe he can procure.  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  R- Patient became tearful talking about feeing frustrated and at times feeling like he is being treated like a child. He was able to share these feelings of sadness and then called to try to find a ride for tomorrow.

## 2012-01-14 NOTE — Progress Notes (Signed)
Psychoeducational Group Note  Date:  01/14/2012 Time:  2000  Group Topic/Focus:  Wrap-Up Group:   The focus of this group is to help patients review their daily goal of treatment and discuss progress on daily workbooks.  Participation Level:  Active  Participation Quality:  Appropriate  Affect:  Appropriate  Cognitive:  Appropriate  Insight:  Engaged  Engagement in Group:  Engaged  Additional Comments:  Pt attended and participated in wrap up. While in group pt mentioned he had a rough day due him not discharging tomorrow because of transportation but was able to make arrangement for transportation and hopes to discharge tomorrow.   [name] Patience 01/14/2012, 9:58 PM

## 2012-01-14 NOTE — Progress Notes (Signed)
D: Patient in his room in bed on approach.  Patient states he did not have a good day because he thought he was supposed to be going home but things changed.  Patient states he has learned that he needs to stay calm and to take his medications to prevent panic attacks.  Patient visible on the unit but not interacting with peers.   Patient denies SI/HI and denies AVH. A: Staff to monitor Q 15 mins for safety.  Encouragement and support offered.  Scheduled medications administered per orders. R: Patient remains safe on the unit.  Patient calm, cooperative and taking administered medications.  Patient attended group tonight.

## 2012-01-14 NOTE — Progress Notes (Signed)
[facility] LCSW Group Therapy      Feelings About Diagnosis         01/14/2012  1:15  Type of Therapy:  Group Therapy  Participation Level:  Minimal  Participation Quality:  Resistant  Affect:  Angry  Cognitive:  Appropriate  Insight:  Poor  Engagement in Therapy:  Poor  Modes of Intervention:    Summary of Progress/Problems:  Patient left group early and angry after being told we can not assist with transportation home.  Wynn Banker 01/14/2012, 1:58 PM

## 2012-01-14 NOTE — Progress Notes (Signed)
Patient ID: [name] [name]., male   DOB: 1960-10-03, 51 y.o.   MRN: [id number] D- patient reports fair sleep and improving appetite.  His energy level is low and his ability to pay attention is improving.  He denies any depression, hopelessness or anxiety at this time.  He denies any thoughts of suicide A- rechecked patient's Bop manually and it is lower but sill elevated .  Plan to recheck BP at noon.  Talked with patient about educational materials he was given yesterday.  R- Patient was able to "teachback" facts about carb modified diet and importance of exercise.

## 2012-01-14 NOTE — Progress Notes (Signed)
[facility] MD Progress Note  01/14/2012 10:54 AM [name] [name].  MRN:  [id number]  Subjective:  Patient reports feeling better. Mood improving, but continues to be anxious about returning to his father`s house.  Diagnosis:   Axis I: Depressive Disorder NOS Axis II: Deferred Axis III:  Past Medical History  Diagnosis Date  . Hypertension   . Diabetes mellitus   . GERD (gastroesophageal reflux disease)   . Pericarditis   . Polysubstance abuse     cocaine, marijuana  . Auditory hallucinations   . Depression   . Hx of medication noncompliance   . History of suicide attempt    Axis IV: economic problems, housing problems and other psychosocial or environmental problems Axis V: 51-60 moderate symptoms  ADL's:  Intact  Sleep: Fair  Appetite:  Fair   Psychiatric Specialty Exam: Review of Systems  Constitutional: Negative.   HENT: Negative.   Eyes: Negative.   Respiratory: Negative.   Cardiovascular: Negative.   Gastrointestinal: Negative.   Genitourinary: Negative.   Musculoskeletal: Negative.   Skin: Negative.   Neurological: Negative.   Endo/Heme/Allergies: Negative.   Psychiatric/Behavioral: Positive for depression. The patient is nervous/anxious.     Blood pressure 130/98, pulse 78, temperature 98 F (36.7 C), temperature source Oral, resp. rate 16, height 5\' 9"  (1.753 m), weight 118.842 kg (262 lb), SpO2 97.00%.Body mass index is 38.69 kg/(m^2).  General Appearance: Casual  Eye Contact::  Fair  Speech:  Garbled  Volume:  Decreased  Mood:  Dysphoric and Hopeless  Affect:  Constricted, Depressed and Flat  Thought Process:  Coherent  Orientation:  Full (Time, Place, and Person)  Thought Content:  WDL  Suicidal Thoughts:  No  Homicidal Thoughts:  No  Memory:  Immediate;   Fair Recent;   Fair Remote;   Fair  Judgement:  Fair  Insight:  Present  Psychomotor Activity:  Decreased  Concentration:  Fair  Recall:  Fair  Akathisia:  No  Handed:  Right  AIMS (if  indicated):     Assets:  Desire for Improvement  Sleep:  Number of Hours: 6.75    Current Medications: Current Facility-Administered Medications  Medication Dose Route Frequency Provider Last Rate Last Dose  . acetaminophen (TYLENOL) tablet 650 mg  650 mg Oral Q6H PRN Sanjuana Kava, NP   650 mg at 01/12/12 1626  . alum & mag hydroxide-simeth (MAALOX/MYLANTA) 200-200-20 MG/5ML suspension 30 mL  30 mL Oral Q4H PRN Sanjuana Kava, NP   30 mL at 01/10/12 1858  . amLODipine (NORVASC) tablet 5 mg  5 mg Oral Daily Sanjuana Kava, NP   5 mg at 01/14/12 0807  . azelastine (ASTELIN) nasal spray 2 spray  2 spray Each Nare BID Jorje Guild, PA-C   2 spray at 01/14/12 0805  . cloNIDine (CATAPRES) tablet 0.3 mg  0.3 mg Oral BID Sanjuana Kava, NP   0.3 mg at 01/14/12 0806  . FLUoxetine (PROZAC) capsule 10 mg  10 mg Oral Daily Dereona Kolodny, MD   10 mg at 01/14/12 0807  . hydrochlorothiazide (HYDRODIURIL) tablet 25 mg  25 mg Oral Daily Nanine Means, NP   25 mg at 01/14/12 0807  . hydrOXYzine (ATARAX/VISTARIL) tablet 25 mg  25 mg Oral TID PRN Patrick North, MD   25 mg at 01/14/12 0649  . insulin aspart (novoLOG) injection 0-15 Units  0-15 Units Subcutaneous TID WC Sanjuana Kava, NP   8 Units at 01/14/12 [phone]  . insulin aspart (novoLOG) injection 10  Units  10 Units Subcutaneous Once Rolan Bucco, MD      . insulin aspart (novoLOG) injection 8 Units  8 Units Subcutaneous TID WC Sanjuana Kava, NP   8 Units at 01/14/12 [phone]  . insulin glargine (LANTUS) injection 25 Units  25 Units Subcutaneous QHS Jorje Guild, PA-C   25 Units at 01/13/12 2128  . lisinopril (PRINIVIL,ZESTRIL) tablet 20 mg  20 mg Oral BID Sanjuana Kava, NP   20 mg at 01/14/12 9604  . magnesium hydroxide (MILK OF MAGNESIA) suspension 30 mL  30 mL Oral Daily PRN Sanjuana Kava, NP      . sodium chloride (OCEAN) 0.65 % nasal spray 1 spray  1 spray Each Nare PRN Nanine Means, NP      . sodium chloride 0.9 % bolus 1,000 mL  1,000 mL Intravenous Once Angus Seller, PA      . traZODone (DESYREL) tablet 50 mg  50 mg Oral QHS Sanjuana Kava, NP   50 mg at 01/13/12 2129  . [DISCONTINUED] insulin aspart (novoLOG) injection 4 Units  4 Units Subcutaneous TID WC Sanjuana Kava, NP   4 Units at 01/13/12 1159    Lab Results:  Results for orders placed during the hospital encounter of 01/09/12 (from the past 48 hour(s))  GLUCOSE, CAPILLARY     Status: Abnormal   Collection Time   01/12/12 12:01 PM      Component Value Range Comment   Glucose-Capillary 292 (*) 70 - 99 mg/dL    Comment 1 Documented in Chart      Comment 2 Notify RN     GLUCOSE, CAPILLARY     Status: Abnormal   Collection Time   01/12/12  5:11 PM      Component Value Range Comment   Glucose-Capillary 539 (*) 70 - 99 mg/dL    Comment 1 Documented in Chart      Comment 2 Notify RN     GLUCOSE, CAPILLARY     Status: Abnormal   Collection Time   01/12/12  8:00 PM      Component Value Range Comment   Glucose-Capillary 513 (*) 70 - 99 mg/dL    Comment 1 Notify RN     GLUCOSE, CAPILLARY     Status: Abnormal   Collection Time   01/12/12  9:48 PM      Component Value Range Comment   Glucose-Capillary 423 (*) 70 - 99 mg/dL    Comment 1 Notify RN     GLUCOSE, CAPILLARY     Status: Abnormal   Collection Time   01/13/12  6:37 AM      Component Value Range Comment   Glucose-Capillary 342 (*) 70 - 99 mg/dL   GLUCOSE, CAPILLARY     Status: Abnormal   Collection Time   01/13/12 11:49 AM      Component Value Range Comment   Glucose-Capillary 384 (*) 70 - 99 mg/dL   GLUCOSE, CAPILLARY     Status: Abnormal   Collection Time   01/13/12  5:04 PM      Component Value Range Comment   Glucose-Capillary 387 (*) 70 - 99 mg/dL   GLUCOSE, CAPILLARY     Status: Abnormal   Collection Time   01/13/12  8:53 PM      Component Value Range Comment   Glucose-Capillary 340 (*) 70 - 99 mg/dL   GLUCOSE, CAPILLARY     Status: Abnormal   Collection Time   01/14/12  6:21 AM      Component Value Range Comment    Glucose-Capillary 273 (*) 70 - 99 mg/dL    Comment 1 Notify RN       Physical Findings: AIMS: Facial and Oral Movements Muscles of Facial Expression: None, normal Lips and Perioral Area: None, normal Jaw: None, normal Tongue: None, normal,Extremity Movements Upper (arms, wrists, hands, fingers): None, normal Lower (legs, knees, ankles, toes): None, normal, Trunk Movements Neck, shoulders, hips: None, normal, Overall Severity Severity of abnormal movements (highest score from questions above): None, normal Incapacitation due to abnormal movements: None, normal Patient's awareness of abnormal movements (rate only patient's report): No Awareness, Dental Status Current problems with teeth and/or dentures?: No Does patient usually wear dentures?: No  CIWA:    COWS:     Treatment Plan Summary: Daily contact with patient to assess and evaluate symptoms and progress in treatment Medication management  Plan: Continue current plan of care.  Medical Decision Making Problem Points:  Established problem, stable/improving (1), Review of last therapy session (1) and Review of psycho-social stressors (1) Data Points:  Review of medication regiment & side effects (2)  I certify that inpatient services furnished can reasonably be expected to improve the patient's condition.   Kedrick Mcnamee 01/14/2012, 10:54 AM

## 2012-01-14 NOTE — Progress Notes (Signed)
D: In hallway on approach. Appears flat and depressed. Calm and cooperative with assessment. Open and spontaneous in conversation. No acute distress noted. States he has had a good day r/t feeling much less anxious and not having a panic attack. States he feels like the medication changes have been beneficial. Also pointed to group as being beneficial as he learned several coping skills. Denies SI/HI/AVH and contracts for safety. Did request prn for anxiety and insomnia. Otherwise offered no questions or concerns.  A: Safety has been maintained with Q15 minute observation. Support and encouragement provided. POC and medications for shift reviewed and understanding verbalized. Meds given as ordered by MD. PRN given for insomnia and anxiety   R: Pt is visible and interacting well with peers. He reports a decreased anxiety level and has not had any panic attacks today. Will f/u response to prn and continue current POC

## 2012-01-14 NOTE — Progress Notes (Signed)
[facility] LCSW Aftercare Discharge Planning Group Note  01/14/2012 9:19 AM  Participation Quality:  Appropriate  Affect:  Appropriate  Cognitive:  Alert and Appropriate  Insight:  Engaged  Engagement in Group:  Engaged  Modes of Intervention:  Education, Geophysicist/field seismologist of Progress/Problems:  Patient reports being much better and denies SI/HI.  He is rating all symptoms at one today.  Patient shared he does not want to return to father's home due to there being a lot of drug activity there.  Wynn Banker 01/14/2012, 9:19 AM

## 2012-01-15 MED ORDER — AMLODIPINE BESYLATE 5 MG PO TABS: 5.0000 mg | ORAL_TABLET | Freq: Every day | ORAL | Status: DC

## 2012-01-15 MED ORDER — HYDROCHLOROTHIAZIDE 25 MG PO TABS: 25.0000 mg | ORAL_TABLET | Freq: Every day | ORAL | Status: DC

## 2012-01-15 MED ORDER — LISINOPRIL 20 MG PO TABS: 20.0000 mg | ORAL_TABLET | Freq: Two times a day (BID) | ORAL | Status: DC

## 2012-01-15 MED ORDER — FLUOXETINE HCL 10 MG PO CAPS: 10.0000 mg | ORAL_CAPSULE | Freq: Every day | ORAL | Status: DC

## 2012-01-15 MED ORDER — INSULIN GLARGINE 100 UNIT/ML ~~LOC~~ SOLN: 32.0000 [IU] | Freq: Every day | SUBCUTANEOUS | Status: DC

## 2012-01-15 MED ORDER — AZELASTINE HCL 0.1 % NA SOLN: 2.0000 | Freq: Two times a day (BID) | NASAL | Status: DC

## 2012-01-15 MED ORDER — TRAZODONE HCL 50 MG PO TABS: 50.0000 mg | ORAL_TABLET | Freq: Every day | ORAL | Status: DC

## 2012-01-15 MED ORDER — CLONIDINE HCL 0.2 MG PO TABS: 0.3000 mg | ORAL_TABLET | Freq: Two times a day (BID) | ORAL | Status: DC

## 2012-01-15 MED ORDER — HYDROXYZINE HCL 25 MG PO TABS: 25.0000 mg | ORAL_TABLET | Freq: Two times a day (BID) | ORAL | Status: DC | PRN

## 2012-01-15 NOTE — [facility] Suicide Risk Assessment (Signed)
Suicide Risk Assessment  Discharge Assessment     Demographic Factors:  Male, Living alone and Unemployed  Mental Status Per Nursing Assessment::   On Admission:     Current Mental Status by Physician: Patient alert and oriented to 4. Denies AH/VH/SI/Hi.  Loss Factors: Financial problems/change in socioeconomic status  Historical Factors: Impulsivity  Risk Reduction Factors:   Living with another person, especially a relative and Positive social support  Continued Clinical Symptoms:  Depression:   Recent sense of peace/wellbeing  Cognitive Features That Contribute To Risk:  Cognitively intact   Suicide Risk:  Minimal: No identifiable suicidal ideation.  Patients presenting with no risk factors but with morbid ruminations; may be classified as minimal risk based on the severity of the depressive symptoms  Discharge Diagnoses:   AXIS I:  Anxiety Disorder NOS and Depressive Disorder NOS AXIS II:  No diagnosis AXIS III:   Past Medical History  Diagnosis Date  . Hypertension   . Diabetes mellitus   . GERD (gastroesophageal reflux disease)   . Pericarditis   . Polysubstance abuse     cocaine, marijuana  . Auditory hallucinations   . Depression   . Hx of medication noncompliance   . History of suicide attempt    AXIS IV:  occupational problems AXIS V:  61-70 mild symptoms  Plan Of Care/Follow-up recommendations:  Activity:  normal Diet:  diabetic  Is patient on multiple antipsychotic therapies at discharge:  No   Has Patient had three or more failed trials of antipsychotic monotherapy by history:  No  Recommended Plan for Multiple Antipsychotic Therapies: NA  Gene Colee 01/15/2012, 9:14 AM

## 2012-01-15 NOTE — Progress Notes (Signed)
Patient ID: [name] Lim., male   DOB: [birth year], 51 y.o.   MRN: [id number] Patient denies SI/HI and AVH ; received copy of AVS, samples, and prescriptions after it was reviewed; patient discharged per physician order; patient had no other questions or concerns at this time; patient left the unit ambulatory with [facility] represenative

## 2012-01-15 NOTE — Tx Team (Signed)
Interdisciplinary Treatment Plan Update (Adult)  Date:  01/15/2012  Time Reviewed:  9:46 AM   Progress in Treatment: Attending groups:   Yes   Participating in groups:  Yes Taking medication as prescribed:  Yes Tolerating medication:  Yes Family/Significant othe contact made: Contact made with family Patient understands diagnosis:  Yes Discussing patient identified problems/goals with staff: Yes Medical problems stabilized or resolved: Yes Denies suicidal/homicidal ideation:Yes Issues/concerns per patient self-inventory:  Other:   New problem(s) identified:  Reason for Continuation of Hospitalization:  Interventions implemented related to continuation of hospitalization:  Additional comments:  Estimated length of stay:  Discharge today  Discharge Plan:  Home with outpatient follow up [facility]  New goal(s):  Review of initial/current patient goals per problem list:    1.  Goal(s): Eliminate SI/other thoughts of self harm   Met:  Yes  Target date: d/c  As evidenced by: Patient no longer endorsing SI/HI or other thoughts of self harm.    2.  Goal (s):Reduce depression/anxiety  Met: Yes  Target date: d/c  As evidenced by: Patient currently rating symptoms at four or below    3.  Goal(s):.stabilize on meds   Met:  Yes  Target date: d/c  As evidenced by: Patient reports being stabilized on medications - less symptomatic    4.  Goal(s): Refer for outpatient follow up   Met:  Yes  Target date: d/c  As evidenced by: Follow up appointment scheduled    Attendees: Patient:  [name], [name] 01/15/2012 9:46 AM  Physican:  Patrick North, MD 01/15/2012 9:46 AM  Nursing:  Leighton Parody , RN 01/15/2012 9:46 AM   Nursing:  Harold Barban , RN 01/15/2012 9:46 AM   Clinical Social Worker:  Juline Patch, LCSW 01/15/2012 9:46 AM   Other: Serena Colonel, FNP 01/15/2012 9:46 AM   Other:  Patton Salles, Clinical Social Worker, LCSW  01/15/2012 9:46  AM Other:        01/15/2012 9:46 AM

## 2012-01-15 NOTE — Progress Notes (Signed)
[facility] Adult Case Management Discharge Plan :  Will you be returning to the same living situation after discharge: Yes,  Patient returning home with father At discharge, do you have transportation home?:Yes,  Outpatient provider to transport patient home Do you have the ability to pay for your medications:Yes,  Patient has Medicaid  Release of information consent forms completed and in the chart;  Patient's signature needed at discharge.  Patient to Follow up at: Follow-up Information    Follow up with Tyra [facility]. On 01/15/2012. (You are scheduled with Tyra today at 3:00 PM)    Contact information:   [address] [place], [place]  [address]  [phone]      Follow up with Dr. Omelia Blackwater - [facility]. (Appointment to be scheduled after meeting with Tyra)    Contact information:   [address] [place], [place]  [address]  [phone]         Patient denies SI/HI:   Yes,  Patient no longer endorsing SI/HI or other thoughts of self harm    Safety Planning and Suicide Prevention discussed:  Yes,  Reviewed during after care groups  Jhamal Plucinski, [place] July 01/15/2012, 12:59 PM

## 2012-01-15 NOTE — Discharge Summary (Signed)
Physician Discharge Summary Note  Patient:  [name] [name]. is an 51 y.o., male MRN:  [id number] DOB:  07-10-1960 Patient phone:  [phone] (home)  Patient address:   [address] [place] [address],   Date of Admission:  01/09/2012 Date of Discharge: 01/15/12  Reason for Admission:  Worsening depression/anxiet and relapse on cocaine.  Discharge Diagnoses: Principal Problem:  *Depression with suicidal ideation Active Problems:  DM  HYPERTENSION  INSOMNIA  Cocaine abuse with cocaine-induced anxiety disorder  Anxiety  Review of Systems  Constitutional: Negative.   HENT: Negative.   Eyes: Negative.   Respiratory: Negative.   Cardiovascular: Negative.   Gastrointestinal: Negative.   Genitourinary: Negative.   Musculoskeletal: Negative.   Skin: Negative.   Neurological: Negative.   Endo/Heme/Allergies: Negative.   Psychiatric/Behavioral: Positive for depression (Stabilized for discharge). Negative for suicidal ideas, hallucinations, memory loss and substance abuse. The patient is nervous/anxious (Stabilized for discharge). The patient does not have insomnia.    Axis Diagnosis:   AXIS I:  Cocaine abuse with cocaine-induced anxiety disorder AXIS II:  Deferred AXIS III:   Past Medical History  Diagnosis Date  . Hypertension   . Diabetes mellitus   . GERD (gastroesophageal reflux disease)   . Pericarditis   . Polysubstance abuse     cocaine, marijuana  . Auditory hallucinations   . Depression   . Hx of medication noncompliance   . History of suicide attempt    AXIS IV:  other psychosocial or environmental problems AXIS V:  62  Level of Care:  OP  Hospital Course: Patient reports 90 days of worsening anxiety and depression leading to his relapse on crack cocaine 1 month ago. Over the last 3-5 days he notes an increase in the severity and frequency of his panic attacks as well as the increase in his depression now with suicidal ideation.  While a patient in this  hospital, Mr. Serano received medication management for major depressive disorder. He was prescribed and received Prozac 10 mg daily for depression, Hydroxyzine 25 mg For anxiety and trazodone 50 mg for sleep.  He was also enrolled in group counseling sessions and activities to learn coping skills that should help him cope better maintain stability after discharge. Patient also received medication management and monitoring for his other medical issues and concerns. He is diabetic and hypertensive as well.  He tolerated his treatment regimen without any significant adverse effects and or reactions.  Patient did respond to his treatment regimen gradually on daily basis. This is evidenced by his daily reports of improved mood, reduction of symptoms and presentation of good affect/eye contact. Patient attended treatment team meeting this am and met with the treatment team members. His reason for admission, present symptoms, treatment plans and response to treatment plans discussed. Patient endorsed that he is doing well and stable for discharge. It was agreed upon that he will continue psychiatric care on outpatient basis with Shirline Frees at the [facility] 01/15/12 @ 3:00 pm. And will make appoint to see Dr. Dolores Frame after this appointment with Shirline Frees. The address, date and time for this appointment provided for patient.  Upon discharge, patient adamantly denies suicidal, homicidal ideations, auditory, visual hallucinations and or delusional thinking. He received from [facility] a 4 days worth supply samples of his [facility] discharge medications. He left [facility] with all personal belongings via family transport in no apparent   Consults:  psychiatry and Diabetes  Significant Diagnostic Studies:  labs: CBC with diff, CMP,  UDS, Toxicology reports, U/A  Discharge Vitals:   Blood pressure 161/101, pulse 81, temperature 97.4 F (36.3 C), temperature source Oral, resp. rate 16, height 5\' 9"  (1.753 m), weight  118.842 kg (262 lb), SpO2 97.00%. Body mass index is 38.69 kg/(m^2). Lab Results:   Results for orders placed during the hospital encounter of 01/09/12 (from the past 72 hour(s))  GLUCOSE, CAPILLARY     Status: Abnormal   Collection Time   01/12/12  5:11 PM      Component Value Range Comment   Glucose-Capillary 539 (*) 70 - 99 mg/dL    Comment 1 Documented in Chart      Comment 2 Notify RN     GLUCOSE, CAPILLARY     Status: Abnormal   Collection Time   01/12/12  8:00 PM      Component Value Range Comment   Glucose-Capillary 513 (*) 70 - 99 mg/dL    Comment 1 Notify RN     GLUCOSE, CAPILLARY     Status: Abnormal   Collection Time   01/12/12  9:48 PM      Component Value Range Comment   Glucose-Capillary 423 (*) 70 - 99 mg/dL    Comment 1 Notify RN     GLUCOSE, CAPILLARY     Status: Abnormal   Collection Time   01/13/12  6:37 AM      Component Value Range Comment   Glucose-Capillary 342 (*) 70 - 99 mg/dL   GLUCOSE, CAPILLARY     Status: Abnormal   Collection Time   01/13/12 11:49 AM      Component Value Range Comment   Glucose-Capillary 384 (*) 70 - 99 mg/dL   GLUCOSE, CAPILLARY     Status: Abnormal   Collection Time   01/13/12  5:04 PM      Component Value Range Comment   Glucose-Capillary 387 (*) 70 - 99 mg/dL   GLUCOSE, CAPILLARY     Status: Abnormal   Collection Time   01/13/12  8:53 PM      Component Value Range Comment   Glucose-Capillary 340 (*) 70 - 99 mg/dL   GLUCOSE, CAPILLARY     Status: Abnormal   Collection Time   01/14/12  6:21 AM      Component Value Range Comment   Glucose-Capillary 273 (*) 70 - 99 mg/dL    Comment 1 Notify RN     GLUCOSE, CAPILLARY     Status: Abnormal   Collection Time   01/14/12 11:57 AM      Component Value Range Comment   Glucose-Capillary 282 (*) 70 - 99 mg/dL    Comment 1 Documented in Chart      Comment 2 Notify RN     GLUCOSE, CAPILLARY     Status: Abnormal   Collection Time   01/14/12  5:07 PM      Component Value Range  Comment   Glucose-Capillary 411 (*) 70 - 99 mg/dL   GLUCOSE, CAPILLARY     Status: Abnormal   Collection Time   01/14/12  6:07 PM      Component Value Range Comment   Glucose-Capillary 357 (*) 70 - 99 mg/dL   BASIC METABOLIC PANEL     Status: Abnormal   Collection Time   01/14/12  8:08 PM      Component Value Range Comment   Sodium 135  135 - 145 mEq/L    Potassium 4.0  3.5 - 5.1 mEq/L    Chloride 98  96 -  112 mEq/L    CO2 26  19 - 32 mEq/L    Glucose, Bld 380 (*) 70 - 99 mg/dL    BUN 22  6 - 23 mg/dL    Creatinine, Ser 9.52  0.50 - 1.35 mg/dL    Calcium 9.7  8.4 - 84.1 mg/dL    GFR calc non Af Amer 63 (*) >90 mL/min    GFR calc Af Amer 73 (*) >90 mL/min   GLUCOSE, CAPILLARY     Status: Abnormal   Collection Time   01/14/12  8:29 PM      Component Value Range Comment   Glucose-Capillary 331 (*) 70 - 99 mg/dL   GLUCOSE, CAPILLARY     Status: Abnormal   Collection Time   01/15/12  5:55 AM      Component Value Range Comment   Glucose-Capillary 332 (*) 70 - 99 mg/dL    Comment 1 Notify RN     GLUCOSE, CAPILLARY     Status: Abnormal   Collection Time   01/15/12 11:54 AM      Component Value Range Comment   Glucose-Capillary 316 (*) 70 - 99 mg/dL     Physical Findings: AIMS: Facial and Oral Movements Muscles of Facial Expression: None, normal Lips and Perioral Area: None, normal Jaw: None, normal Tongue: None, normal,Extremity Movements Upper (arms, wrists, hands, fingers): None, normal Lower (legs, knees, ankles, toes): None, normal, Trunk Movements Neck, shoulders, hips: None, normal, Overall Severity Severity of abnormal movements (highest score from questions above): None, normal Incapacitation due to abnormal movements: None, normal Patient's awareness of abnormal movements (rate only patient's report): No Awareness, Dental Status Current problems with teeth and/or dentures?: No Does patient usually wear dentures?: No  CIWA:    COWS:     Psychiatric Specialty  Exam: See Psychiatric Specialty Exam and Suicide Risk Assessment completed by Attending Physician prior to discharge.  Discharge destination:  Home  Is patient on multiple antipsychotic therapies at discharge:  No   Has Patient had three or more failed trials of antipsychotic monotherapy by history:  No  Recommended Plan for Multiple Antipsychotic Therapies: NA     Medication List     As of 01/15/2012  1:19 PM    TAKE these medications      Indication    amLODipine 5 MG tablet   Commonly known as: NORVASC   Take 1 tablet (5 mg total) by mouth daily. For hypertension       azelastine 137 MCG/SPRAY nasal spray   Commonly known as: ASTELIN   Place 2 sprays into the nose 2 (two) times daily. Use in each nostril as directed: for allergies    Indication: Hayfever      cloNIDine 0.2 MG tablet   Commonly known as: CATAPRES   Take 1.5 tablets (0.3 mg total) by mouth 2 (two) times daily. For Hypertension       FLUoxetine 10 MG capsule   Commonly known as: PROZAC   Take 1 capsule (10 mg total) by mouth daily. For depression       hydrochlorothiazide 25 MG tablet   Commonly known as: HYDRODIURIL   Take 1 tablet (25 mg total) by mouth daily. For hypertension    Indication: High Blood Pressure      hydrOXYzine 25 MG tablet   Commonly known as: ATARAX/VISTARIL   Take 1 tablet (25 mg total) by mouth 2 (two) times daily as needed for anxiety. For anxiety       insulin  glargine 100 UNIT/ML injection   Commonly known as: LANTUS   Inject 32 Units into the skin at bedtime. For high blood sugar control       lisinopril 20 MG tablet   Commonly known as: PRINIVIL,ZESTRIL   Take 1 tablet (20 mg total) by mouth 2 (two) times daily. For hypertension       traZODone 50 MG tablet   Commonly known as: DESYREL   Take 1 tablet (50 mg total) by mouth at bedtime. Depression/sleep         Follow-up Information    Follow up with Tyra [facility]. On 01/15/2012. (You are  scheduled with Tyra today at 3:00 PM)    Contact information:   [address] [place], [place]  [address]  [phone]      Follow up with Dr. Omelia Blackwater [facility]. (Appointment to be scheduled after meeting with Tyra)    Contact information:   [address] [place], [place]  [address]  [phone]         Follow-up recommendations:  Activity:  as tolerated Other:  Keep all scheduled follow-up appointments as recommended.    Comments: Take all your medications as prescribed by your mental healthcare provider. Report any adverse effects and or reactions from your medicines to your outpatient provider promptly. Patient is instructed and cautioned to not engage in alcohol and or illegal drug use while on prescription medicines. In the event of worsening symptoms, patient is instructed to call the crisis hotline, 911 and or go to the nearest ED for appropriate evaluation and treatment of symptoms. Follow-up with your primary care provider for your other medical issues, concerns and or health care needs.     Total Discharge Time:  More than 30 minutes.  SignedArmandina Stammer I 01/15/2012, 1:19 PM

## 2012-01-15 NOTE — Progress Notes (Signed)
[facility] Group Notes:  (Counselor/Nursing/MHT/Case Management/Adjunct)  01/15/2012 4:48 PM  Type of Therapy:  Psychoeducational Skills  Participation Level:  Minimal  Participation Quality:  Appropriate, Attentive and Sharing  Affect:  Appropriate  Cognitive:  Alert, Appropriate and Oriented  Insight:  Good  Engagement in Group:  Limited  Engagement in Therapy:  n/a  Modes of Intervention:  Activity, Discussion, Education, Socialization and Support  Summary of Progress/Problems: [name] [name] attended psychoeducational group on labels. [name] [name] viewed a video depicting different ways labels are used to change perceptions. [name] [name] was active while group discussed what labels are, how we use them, how they effect they way we think about and perceive the world, and listed positive  and negative labels they have used or been called. [name] [name] was given a homework assignment to list 10 words he has been labeled and find the reality of the situation/label.   Wandra Scot 01/15/2012, 4:48 PM

## 2012-01-17 NOTE — Progress Notes (Signed)
Patient Discharge Instructions:  After Visit Summary (AVS):   Faxed to:  01/17/12 Psychiatric Admission Assessment Note:   Faxed to:  01/17/12 Suicide Risk Assessment - Discharge Assessment:   Faxed to:  01/17/12 Faxed/Sent to the Next Level Care provider:  01/17/12 Faxed to [facility] @ [phone]  Jerelene Redden, 01/17/2012, 4:12 PM

## 2012-01-20 NOTE — Discharge Summary (Signed)
Reviewed

## 2012-09-21 ENCOUNTER — Inpatient Hospital Stay: Payer: Self-pay | Admitting: Specialist

## 2012-09-21 LAB — CBC
HCT: 43.1 % (ref 40.0–52.0)
MCHC: 35.5 g/dL (ref 32.0–36.0)
MCV: 89 fL (ref 80–100)
RBC: 4.85 10*6/uL (ref 4.40–5.90)
RDW: 13.1 % (ref 11.5–14.5)

## 2012-09-21 LAB — CK TOTAL AND CKMB (NOT AT ARMC)
CK, Total: 262 U/L — ABNORMAL HIGH (ref 35–232)
CK-MB: 3.4 ng/mL (ref 0.5–3.6)

## 2012-09-21 LAB — COMPREHENSIVE METABOLIC PANEL
Albumin: 3.5 g/dL (ref 3.4–5.0)
Alkaline Phosphatase: 101 U/L (ref 50–136)
Anion Gap: 6 — ABNORMAL LOW (ref 7–16)
BUN: 18 mg/dL (ref 7–18)
Calcium, Total: 9.1 mg/dL (ref 8.5–10.1)
Co2: 27 mmol/L (ref 21–32)
EGFR (African American): >60
EGFR (Non-African Amer.): >60
Glucose: 339 mg/dL — ABNORMAL HIGH (ref 65–99)
Osmolality: 284 (ref 275–301)
Potassium: 3.7 mmol/L (ref 3.5–5.1)
SGOT(AST): 20 U/L (ref 15–37)
SGPT (ALT): 24 U/L (ref 12–78)
Sodium: 134 mmol/L — ABNORMAL LOW (ref 136–145)
Total Protein: 7.2 g/dL (ref 6.4–8.2)

## 2012-09-21 LAB — TROPONIN I: Troponin-I: 0.09 ng/mL — ABNORMAL HIGH

## 2012-09-22 LAB — BASIC METABOLIC PANEL
Anion Gap: 4 — ABNORMAL LOW (ref 7–16)
BUN: 24 mg/dL — ABNORMAL HIGH (ref 7–18)
Calcium, Total: 8.7 mg/dL (ref 8.5–10.1)
Co2: 31 mmol/L (ref 21–32)
Creatinine: 1.68 mg/dL — ABNORMAL HIGH (ref 0.60–1.30)
Potassium: 3.6 mmol/L (ref 3.5–5.1)
Sodium: 134 mmol/L — ABNORMAL LOW (ref 136–145)

## 2012-09-22 LAB — CBC WITH DIFFERENTIAL/PLATELET
Basophil %: 0.8 %
Eosinophil #: 0.3 10*3/uL (ref 0.0–0.7)
HCT: 40.6 % (ref 40.0–52.0)
HGB: 14.4 g/dL (ref 13.0–18.0)
Lymphocyte #: 2.9 10*3/uL (ref 1.0–3.6)
MCH: 31.7 pg (ref 26.0–34.0)
MCHC: 35.4 g/dL (ref 32.0–36.0)
MCV: 90 fL (ref 80–100)
Monocyte %: 8.3 %
Neutrophil %: 60.7 %
Platelet: 171 10*3/uL (ref 150–440)
WBC: 10.8 10*3/uL — ABNORMAL HIGH (ref 3.8–10.6)

## 2012-09-22 LAB — LIPID PANEL
Ldl Cholesterol, Calc: 98 mg/dL (ref 0–100)
VLDL Cholesterol, Calc: 31 mg/dL (ref 5–40)

## 2012-09-22 LAB — TROPONIN I: Troponin-I: 0.06 ng/mL — ABNORMAL HIGH

## 2012-09-22 LAB — TSH: Thyroid Stimulating Horm: 0.44 u[IU]/mL — ABNORMAL LOW

## 2012-09-23 LAB — BASIC METABOLIC PANEL
Creatinine: 1.26 mg/dL (ref 0.60–1.30)
EGFR (Non-African Amer.): >60
Osmolality: 283 (ref 275–301)
Potassium: 3.8 mmol/L (ref 3.5–5.1)

## 2012-09-24 ENCOUNTER — Inpatient Hospital Stay: Payer: Self-pay | Admitting: Specialist

## 2012-09-24 LAB — COMPREHENSIVE METABOLIC PANEL
Alkaline Phosphatase: 90 U/L (ref 50–136)
Anion Gap: 6 — ABNORMAL LOW (ref 7–16)
Bilirubin,Total: 0.3 mg/dL (ref 0.2–1.0)
Chloride: 102 mmol/L (ref 98–107)
Co2: 27 mmol/L (ref 21–32)
Creatinine: 1.29 mg/dL (ref 0.60–1.30)
EGFR (Non-African Amer.): >60
Glucose: 354 mg/dL — ABNORMAL HIGH (ref 65–99)
Potassium: 4.1 mmol/L (ref 3.5–5.1)
SGPT (ALT): 23 U/L (ref 12–78)
Sodium: 135 mmol/L — ABNORMAL LOW (ref 136–145)

## 2012-09-24 LAB — TROPONIN I: Troponin-I: 0.05 ng/mL

## 2012-09-24 LAB — URINALYSIS, COMPLETE
Bilirubin,UR: NEGATIVE
Blood: NEGATIVE
Glucose,UR: >=500 mg/dL (ref 0–75)
Hyaline Cast: 5
Ketone: NEGATIVE
Nitrite: NEGATIVE
Specific Gravity: 1.026 (ref 1.003–1.030)
Squamous Epithelial: NONE SEEN
WBC UR: NONE SEEN /HPF (ref 0–5)

## 2012-09-24 LAB — CBC
HCT: 40.3 % (ref 40.0–52.0)
HGB: 14.1 g/dL (ref 13.0–18.0)
MCH: 31.7 pg (ref 26.0–34.0)
MCHC: 34.9 g/dL (ref 32.0–36.0)
MCV: 91 fL (ref 80–100)
RBC: 4.44 10*6/uL (ref 4.40–5.90)
RDW: 13.2 % (ref 11.5–14.5)

## 2012-09-24 LAB — PROTIME-INR: INR: 1

## 2012-09-24 LAB — CK TOTAL AND CKMB (NOT AT ARMC): CK-MB: 4.5 ng/mL — ABNORMAL HIGH (ref 0.5–3.6)

## 2012-09-25 LAB — CBC WITH DIFFERENTIAL/PLATELET
Basophil #: 0 10*3/uL (ref 0.0–0.1)
Eosinophil #: 0.4 10*3/uL (ref 0.0–0.7)
Eosinophil %: 3.7 %
HCT: 39.5 % — ABNORMAL LOW (ref 40.0–52.0)
Lymphocyte #: 3.7 10*3/uL — ABNORMAL HIGH (ref 1.0–3.6)
Lymphocyte %: 35.3 %
MCHC: 34.7 g/dL (ref 32.0–36.0)
MCV: 91 fL (ref 80–100)
Monocyte #: 0.9 x10 3/mm (ref 0.2–1.0)
Neutrophil %: 51.6 %
Platelet: 151 10*3/uL (ref 150–440)
RDW: 13.1 % (ref 11.5–14.5)
WBC: 10.4 10*3/uL (ref 3.8–10.6)

## 2012-09-30 ENCOUNTER — Ambulatory Visit: Payer: Self-pay | Admitting: Family Medicine

## 2012-12-01 ENCOUNTER — Encounter (HOSPITAL_COMMUNITY): Payer: Self-pay | Admitting: Emergency Medicine

## 2012-12-01 ENCOUNTER — Emergency Department (HOSPITAL_COMMUNITY): Payer: Medicaid Other

## 2012-12-01 ENCOUNTER — Emergency Department (HOSPITAL_COMMUNITY)
Admission: EM | Admit: 2012-12-01 | Discharge: 2012-12-01 | Disposition: A | Discharge status: Home / Self Care | Payer: Medicaid Other | Attending: Emergency Medicine | Admitting: Emergency Medicine

## 2012-12-01 DIAGNOSIS — R55 Syncope and collapse: Secondary | ICD-10-CM | POA: Insufficient documentation

## 2012-12-01 DIAGNOSIS — K219 Gastro-esophageal reflux disease without esophagitis: Secondary | ICD-10-CM | POA: Insufficient documentation

## 2012-12-01 DIAGNOSIS — Z794 Long term (current) use of insulin: Secondary | ICD-10-CM | POA: Insufficient documentation

## 2012-12-01 DIAGNOSIS — F17200 Nicotine dependence, unspecified, uncomplicated: Secondary | ICD-10-CM | POA: Insufficient documentation

## 2012-12-01 DIAGNOSIS — E119 Type 2 diabetes mellitus without complications: Secondary | ICD-10-CM | POA: Insufficient documentation

## 2012-12-01 DIAGNOSIS — Z8673 Personal history of transient ischemic attack (TIA), and cerebral infarction without residual deficits: Secondary | ICD-10-CM | POA: Insufficient documentation

## 2012-12-01 DIAGNOSIS — I10 Essential (primary) hypertension: Secondary | ICD-10-CM | POA: Insufficient documentation

## 2012-12-01 DIAGNOSIS — Z9119 Patient's noncompliance with other medical treatment and regimen: Secondary | ICD-10-CM | POA: Insufficient documentation

## 2012-12-01 DIAGNOSIS — F329 Major depressive disorder, single episode, unspecified: Secondary | ICD-10-CM | POA: Insufficient documentation

## 2012-12-01 DIAGNOSIS — Z7982 Long term (current) use of aspirin: Secondary | ICD-10-CM | POA: Insufficient documentation

## 2012-12-01 DIAGNOSIS — R51 Headache: Secondary | ICD-10-CM | POA: Insufficient documentation

## 2012-12-01 DIAGNOSIS — Z8669 Personal history of other diseases of the nervous system and sense organs: Secondary | ICD-10-CM | POA: Insufficient documentation

## 2012-12-01 DIAGNOSIS — Z79899 Other long term (current) drug therapy: Secondary | ICD-10-CM | POA: Insufficient documentation

## 2012-12-01 DIAGNOSIS — R110 Nausea: Secondary | ICD-10-CM | POA: Insufficient documentation

## 2012-12-01 DIAGNOSIS — Z91199 Patient's noncompliance with other medical treatment and regimen due to unspecified reason: Secondary | ICD-10-CM | POA: Insufficient documentation

## 2012-12-01 DIAGNOSIS — F3289 Other specified depressive episodes: Secondary | ICD-10-CM | POA: Insufficient documentation

## 2012-12-01 HISTORY — DX: Cerebral infarction, unspecified: I63.9

## 2012-12-01 LAB — CBC WITH DIFFERENTIAL/PLATELET
Basophils Relative: 0 % (ref 0–1)
Eosinophils Absolute: 0.2 10*3/uL (ref 0.0–0.7)
Eosinophils Relative: 2 % (ref 0–5)
Hemoglobin: 14.9 g/dL (ref 13.0–17.0)
Lymphs Abs: 2.3 10*3/uL (ref 0.7–4.0)
MCH: 31.2 pg (ref 26.0–34.0)
MCHC: 36.4 g/dL — ABNORMAL HIGH (ref 30.0–36.0)
MCV: 85.7 fL (ref 78.0–100.0)
Monocytes Relative: 7 % (ref 3–12)
Platelets: 202 10*3/uL (ref 150–400)
RBC: 4.77 MIL/uL (ref 4.22–5.81)

## 2012-12-01 LAB — POCT I-STAT, CHEM 8
Creatinine, Ser: 1.2 mg/dL (ref 0.50–1.35)
Glucose, Bld: 385 mg/dL — ABNORMAL HIGH (ref 70–99)
Hemoglobin: 16 g/dL (ref 13.0–17.0)
TCO2: 27 mmol/L (ref 0–100)

## 2012-12-01 MED ORDER — TETRACAINE HCL 0.5 % OP SOLN
1.0000 [drp] | Freq: Once | OPHTHALMIC | Status: AC
  Administered 2012-12-01: 1 [drp] via OPHTHALMIC
  Filled 2012-12-01: qty 2

## 2012-12-01 MED ORDER — ASPIRIN 325 MG PO TABS: 325.0000 mg | ORAL_TABLET | Freq: Every day | ORAL | Status: DC

## 2012-12-01 MED ORDER — NAPROXEN 500 MG PO TABS: 500.0000 mg | ORAL_TABLET | Freq: Two times a day (BID) | ORAL | Status: DC

## 2012-12-01 MED ORDER — HYDROCODONE-ACETAMINOPHEN 5-325 MG PO TABS: 2.0000 | ORAL_TABLET | ORAL | Status: DC | PRN

## 2012-12-01 MED ORDER — HYDROMORPHONE HCL PF 1 MG/ML IJ SOLN
1.0000 mg | Freq: Once | INTRAMUSCULAR | Status: AC
  Administered 2012-12-01: 1 mg via INTRAVENOUS
  Filled 2012-12-01: qty 1

## 2012-12-01 MED ORDER — CLONIDINE HCL 0.2 MG PO TABS
0.2000 mg | ORAL_TABLET | Freq: Three times a day (TID) | ORAL | Status: DC | PRN
  Administered 2012-12-01: 0.2 mg via ORAL
  Filled 2012-12-01: qty 1

## 2012-12-01 NOTE — ED Provider Notes (Signed)
Medical screening examination/treatment/procedure(s) were conducted as a shared visit with non-physician practitioner(s) and myself.  I personally evaluated the patient during the encounter  Please see my separate respective documentation pertaining to this patient encounter   Vida Roller, MD 12/01/12 1521

## 2012-12-01 NOTE — ED Provider Notes (Signed)
12:03 PM Two drops of tetracaine/proparacaine instilled into bilateral eyes.   Tonometry performed. Right eye pressure: 19,19 Left eye pressure: 19,17,18  Patient tolerated procedure well without immediate complication.    Renne Crigler, PA-C 12/01/12 1204

## 2012-12-01 NOTE — ED Notes (Signed)
Patient presents to ed c/o headache over his left eye onset Friday states it has hurt all weekend and isn't getting better. Using OTC advil without relief. States he had a  [employer] 2 months ago and was in [employer] , c/o left sided weakness still Friday. Describes as being clusmy with his left hand. Patient was ambulatory to ed. A/o PEARL bilaterally.

## 2012-12-01 NOTE — ED Notes (Addendum)
Pt ambulatory upon d/c. Pt given d/c teaching, prescriptions, and follow up care instructions. Pt verbalizes understanding and has no further questions upon d/c. Pt instructed not to drive home. Pt endorses he has a sponsor coming to pick him up from lobby. NAD noted upon d/c. Verified with Dr Hyacinth Meeker pt stable for d/c with BP.

## 2012-12-01 NOTE — ED Provider Notes (Signed)
CSN: [id number]     Arrival date & time 12/01/12  1012 History   First MD Initiated Contact with Patient 12/01/12 1045     Chief Complaint  Patient presents with  . Headache  . Dizziness   (Consider location/radiation/quality/duration/timing/severity/associated sxs/prior Treatment) HPI Comments:  52 year old male with a history of hypertension, diabetes and decreased vision in the left eye. He presents to the hospital with a complaint of a headache which is left-sided, periorbital, has not made his vision any worse than baseline. The symptoms are persistent since Friday, they wax and wane in intensity and have been associated with nausea, near syncope which resolves when he sits down. There has been no syncopal episodes, no palpitations chest pain shortness of breath coughing abdominal pain swelling numbness tingling or weakness.  Patient is a 52 y.o. male presenting with headaches. The history is provided by the patient.  Headache   Past Medical History  Diagnosis Date  . Hypertension   . Diabetes mellitus   . GERD (gastroesophageal reflux disease)   . Pericarditis   . Polysubstance abuse     cocaine, marijuana  . Auditory hallucinations   . Depression   . Hx of medication noncompliance   . History of suicide attempt   . Stroke    Past Surgical History  Procedure Laterality Date  . Pericardial fluid drainage     Family History  Problem Relation Age of Onset  . Stroke Mother   . Diabetes type II Mother   . Hypertension Father   . Arthritis Father   . Arthritis Sister   . Diabetes type II Sister   . Hypertension Sister    History  Substance Use Topics  . Smoking status: Current Every Day Smoker -- 0.33 packs/day  . Smokeless tobacco: Not on file  . Alcohol Use: Yes     Comment: occasionally; reports 1 pint of vodka per month at payday.    Review of Systems  Neurological: Positive for headaches.  All other systems reviewed and are negative.    Allergies   Review of patient's allergies indicates no known allergies.  Home Medications   Current Outpatient Rx  Name  Route  Sig  Dispense  Refill  . amLODipine (NORVASC) 10 MG tablet   Oral   Take 10 mg by mouth daily.         . cloNIDine (CATAPRES) 0.2 MG tablet   Oral   Take 0.2 mg by mouth 2 (two) times daily. For Hypertension         . FLUoxetine (PROZAC) 20 MG capsule   Oral   Take 20 mg by mouth daily.         Marland Kitchen ibuprofen (ADVIL,MOTRIN) 200 MG tablet   Oral   Take 600 mg by mouth every 6 (six) hours as needed for pain.         Marland Kitchen insulin glargine (LANTUS) 100 UNIT/ML injection   Subcutaneous   Inject 55 Units into the skin at bedtime. For high blood sugar control         . lisinopril (PRINIVIL,ZESTRIL) 40 MG tablet   Oral   Take 40 mg by mouth daily.         . metFORMIN (GLUCOPHAGE) 500 MG tablet   Oral   Take 500 mg by mouth 2 (two) times daily with a meal.         . pantoprazole (PROTONIX) 40 MG tablet   Oral   Take 40 mg by mouth daily.         Marland Kitchen  traZODone (DESYREL) 100 MG tablet   Oral   Take 100 mg by mouth at bedtime.         Marland Kitchen aspirin (BAYER ASPIRIN) 325 MG tablet   Oral   Take 1 tablet (325 mg total) by mouth daily.   30 tablet   1   . HYDROcodone-acetaminophen (NORCO/VICODIN) 5-325 MG per tablet   Oral   Take 2 tablets by mouth every 4 (four) hours as needed for pain.   10 tablet   0   . naproxen (NAPROSYN) 500 MG tablet   Oral   Take 1 tablet (500 mg total) by mouth 2 (two) times daily with a meal.   30 tablet   0    BP 155/96  Pulse 75  Temp(Src) 97.8 F (36.6 C) (Oral)  Resp 13  Ht 5\' 11"  (1.803 m)  Wt 265 lb (120.203 kg)  BMI 36.98 kg/m2  SpO2 90% Physical Exam  Nursing note and vitals reviewed. Constitutional: He appears well-developed and well-nourished. No distress.  HENT:  Head: Normocephalic and atraumatic.  Mouth/Throat: Oropharynx is clear and moist. No oropharyngeal exudate.  Eyes: Conjunctivae and EOM  are normal. Pupils are equal, round, and reactive to light. Right eye exhibits no discharge. Left eye exhibits no discharge. No scleral icterus.  Pupils are equal round and reactive, left-sided proptosis present  Neck: Normal range of motion. Neck supple. No JVD present. No thyromegaly present.  Cardiovascular: Normal rate, regular rhythm, normal heart sounds and intact distal pulses.  Exam reveals no gallop and no friction rub.   No murmur heard. Pulmonary/Chest: Effort normal and breath sounds normal. No respiratory distress. He has no wheezes. He has no rales.  Abdominal: Soft. Bowel sounds are normal. He exhibits no distension and no mass. There is no tenderness.  Musculoskeletal: Normal range of motion. He exhibits no edema and no tenderness.  Lymphadenopathy:    He has no cervical adenopathy.  Neurological: He is alert. Coordination normal.  Normal speech, gait, coordination, normal strength of the upper and lower extremities bilaterally. Cranial nerves III through XII intact  Skin: Skin is warm and dry. No rash noted. No erythema.  Psychiatric: He has a normal mood and affect. His behavior is normal.    ED Course  Procedures (including critical care time) Labs Review Labs Reviewed  CBC WITH DIFFERENTIAL - Abnormal; Notable for the following:    WBC 11.2 (*)    MCHC 36.4 (*)    Neutro Abs 7.8 (*)    All other components within normal limits  POCT I-STAT, CHEM 8 - Abnormal; Notable for the following:    Glucose, Bld 385 (*)    Calcium, Ion 1.26 (*)    All other components within normal limits  POCT I-STAT TROPONIN I   Imaging Review Ct Head Wo Contrast  12/01/2012   CLINICAL DATA:  Headache, left orbital pain, recent stroke 2 months ago. Left-sided weakness.  EXAM: CT HEAD AND ORBITS WITHOUT CONTRAST  TECHNIQUE: Contiguous axial images were obtained from the base of the skull through the vertex without contrast. Multidetector CT imaging of the orbits was performed using the  standard protocol without intravenous contrast.  COMPARISON:  11/07/2010  FINDINGS: CT HEAD FINDINGS  Mild patchy white matter chronic microvascular ischemic change. No acute intracranial hemorrhage, midline shift, herniation, hydrocephalus, or extra-axial fluid collection. Cisterns are patent. Diffuse left cerebellar hypodensity noted, images 7 through 9 compatible with a left cerebellar subacute infarct.  CT ORBITS FINDINGS  Orbits appear symmetric.  Mastoids are clear. Chronic polypoid left maxillary sinus mucosal thickening compatible with a retention cyst or polyp. Other sinuses clear. Orbits appear symmetric. Globes intact. No proptosis or definite orbital abnormality or asymmetry. No orbital fracture or blowout fracture demonstrated. Visualized maxilla, pterygoid plates, zygomas, skullbase, nasal septum, and orbits appear intact. Minimal irregularity of the nasal bones without soft tissue swelling, suspect prior trauma.  IMPRESSION: Diffuse left cerebellar hypodensity compatible with a subacute infarct.  No acute intracranial hemorrhage  Orbits appear intact without fracture or acute finding.  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   Electronically Signed   By: Ruel Favors M.D.   On: 12/01/2012 14:06   Ct Orbitss W/o Cm  12/01/2012   CLINICAL DATA:  Headache, left orbital pain, recent stroke 2 months ago. Left-sided weakness.  EXAM: CT HEAD AND ORBITS WITHOUT CONTRAST  TECHNIQUE: Contiguous axial images were obtained from the base of the skull through the vertex without contrast. Multidetector CT imaging of the orbits was performed using the standard protocol without intravenous contrast.  COMPARISON:  11/07/2010  FINDINGS: CT HEAD FINDINGS  Mild patchy white matter chronic microvascular ischemic change. No acute intracranial hemorrhage, midline shift, herniation, hydrocephalus, or extra-axial fluid collection. Cisterns are patent. Diffuse left cerebellar hypodensity noted, images 7  through 9 compatible with a left cerebellar subacute infarct.  CT ORBITS FINDINGS  Orbits appear symmetric. Mastoids are clear. Chronic polypoid left maxillary sinus mucosal thickening compatible with a retention cyst or polyp. Other sinuses clear. Orbits appear symmetric. Globes intact. No proptosis or definite orbital abnormality or asymmetry. No orbital fracture or blowout fracture demonstrated. Visualized maxilla, pterygoid plates, zygomas, skullbase, nasal septum, and orbits appear intact. Minimal irregularity of the nasal bones without soft tissue swelling, suspect prior trauma.  IMPRESSION: Diffuse left cerebellar hypodensity compatible with a subacute infarct.  No acute intracranial hemorrhage  Orbits appear intact without fracture or acute finding.  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   Electronically Signed   By: Ruel Favors M.D.   On: 12/01/2012 14:06    EKG Interpretation     Ventricular Rate:  77 PR Interval:  164 QRS Duration: 92 QT Interval:  408 QTC Calculation: 461 R Axis:   -27 Text Interpretation:  Normal sinus rhythm Biatrial enlargement Septal infarct , age undetermined T wave abnormality, consider lateral ischemia Abnormal ECG No significant change since last tracing            MDM   1. Headache   2. Hypertension    The patient does appear to have slight proptosis on the left, I do not see any recent imaging of his brain since 2012, vital signs show that the patient is hypertensive to 175/111, this has slowly improved but still remains hypertensive. He is having headache with nausea and near-syncope, this could all be related to primary headache, primary hypertension but with the proptosis on the CT scan imaging to evaluate for retro-bulbar or retro-orbital abnormalities. Pain medications were, clonidine ordered as the patient already takes this medication at home.  No signs of tumor, CT scan shows left cerebellar ischemia consistent with the  patient's report of having a prior posterior occipital left-sided stroke. He states this occurred approximately 60 days ago, his symptoms have not worsened since that time with relating to his feeling off balance or weakness. He has no signs of periorbital or retro-bulbar tumors, his headache has improved with medications and he'll be discharged home on medications including aspirin for ongoing  treatment of his prior stroke. I also recommended followup with neurology, his blood pressure has improved with his blood pressure medications.  Meds given in ED:  Medications  cloNIDine (CATAPRES) tablet 0.2 mg (0.2 mg Oral Given 12/01/12 1242)  HYDROmorphone (DILAUDID) injection 1 mg (1 mg Intravenous Given 12/01/12 1156)  tetracaine (PONTOCAINE) 0.5 % ophthalmic solution 1 drop (1 drop Both Eyes Given 12/01/12 1156)    New Prescriptions   ASPIRIN (BAYER ASPIRIN) 325 MG TABLET    Take 1 tablet (325 mg total) by mouth daily.   HYDROCODONE-ACETAMINOPHEN (NORCO/VICODIN) 5-325 MG PER TABLET    Take 2 tablets by mouth every 4 (four) hours as needed for pain.   NAPROXEN (NAPROSYN) 500 MG TABLET    Take 1 tablet (500 mg total) by mouth 2 (two) times daily with a meal.      Vida Roller, MD 12/01/12 1520

## 2012-12-01 NOTE — ED Notes (Signed)
Pt reports hx of cva 2 months ago. Having left side headache to face and behind his left eye, dizziness, and nausea that started on Friday. Reports having numbness to left side as residual from cva. ekg done at triage but no acute distress noted.

## 2013-01-04 ENCOUNTER — Observation Stay (HOSPITAL_COMMUNITY)
Admission: EM | Admit: 2013-01-04 | Discharge: 2013-01-06 | Disposition: A | Discharge status: Home / Self Care | Payer: Medicaid Other | Attending: Internal Medicine | Admitting: Internal Medicine

## 2013-01-04 ENCOUNTER — Encounter (HOSPITAL_COMMUNITY): Payer: Self-pay | Admitting: Emergency Medicine

## 2013-01-04 DIAGNOSIS — I319 Disease of pericardium, unspecified: Secondary | ICD-10-CM

## 2013-01-04 DIAGNOSIS — I160 Hypertensive urgency: Secondary | ICD-10-CM

## 2013-01-04 DIAGNOSIS — F329 Major depressive disorder, single episode, unspecified: Secondary | ICD-10-CM

## 2013-01-04 DIAGNOSIS — F489 Nonpsychotic mental disorder, unspecified: Secondary | ICD-10-CM

## 2013-01-04 DIAGNOSIS — R1013 Epigastric pain: Secondary | ICD-10-CM

## 2013-01-04 DIAGNOSIS — F419 Anxiety disorder, unspecified: Secondary | ICD-10-CM

## 2013-01-04 DIAGNOSIS — F14180 Cocaine abuse with cocaine-induced anxiety disorder: Secondary | ICD-10-CM

## 2013-01-04 DIAGNOSIS — R0602 Shortness of breath: Secondary | ICD-10-CM

## 2013-01-04 DIAGNOSIS — F1910 Other psychoactive substance abuse, uncomplicated: Secondary | ICD-10-CM

## 2013-01-04 DIAGNOSIS — I10 Essential (primary) hypertension: Principal | ICD-10-CM | POA: Insufficient documentation

## 2013-01-04 DIAGNOSIS — I674 Hypertensive encephalopathy: Secondary | ICD-10-CM

## 2013-01-04 DIAGNOSIS — E119 Type 2 diabetes mellitus without complications: Secondary | ICD-10-CM | POA: Insufficient documentation

## 2013-01-04 DIAGNOSIS — E1165 Type 2 diabetes mellitus with hyperglycemia: Secondary | ICD-10-CM

## 2013-01-04 DIAGNOSIS — G4700 Insomnia, unspecified: Secondary | ICD-10-CM

## 2013-01-04 DIAGNOSIS — R739 Hyperglycemia, unspecified: Secondary | ICD-10-CM

## 2013-01-04 NOTE — ED Notes (Signed)
Patient c/o shortness of breath when he lays down.  Patient states he hasn't had any of his medications since Saturday.

## 2013-01-05 ENCOUNTER — Encounter (HOSPITAL_COMMUNITY): Payer: Self-pay | Admitting: *Deleted

## 2013-01-05 ENCOUNTER — Emergency Department (HOSPITAL_COMMUNITY): Payer: Medicaid Other

## 2013-01-05 DIAGNOSIS — E119 Type 2 diabetes mellitus without complications: Secondary | ICD-10-CM

## 2013-01-05 DIAGNOSIS — I10 Essential (primary) hypertension: Secondary | ICD-10-CM

## 2013-01-05 DIAGNOSIS — I059 Rheumatic mitral valve disease, unspecified: Secondary | ICD-10-CM

## 2013-01-05 DIAGNOSIS — E1165 Type 2 diabetes mellitus with hyperglycemia: Secondary | ICD-10-CM | POA: Diagnosis present

## 2013-01-05 LAB — HEPATIC FUNCTION PANEL
AST: 16 U/L (ref 0–37)
Albumin: 4 g/dL (ref 3.5–5.2)
Bilirubin, Direct: 0.1 mg/dL (ref 0.0–0.3)
Indirect Bilirubin: 0.6 mg/dL (ref 0.3–0.9)

## 2013-01-05 LAB — BASIC METABOLIC PANEL
CO2: 25 mEq/L (ref 19–32)
Calcium: 9.5 mg/dL (ref 8.4–10.5)
Creatinine, Ser: 1.12 mg/dL (ref 0.50–1.35)
GFR calc Af Amer: 86 mL/min — ABNORMAL LOW (ref >90)

## 2013-01-05 LAB — CBC WITH DIFFERENTIAL/PLATELET
Basophils Absolute: 0 10*3/uL (ref 0.0–0.1)
Basophils Relative: 0 % (ref 0–1)
Eosinophils Absolute: 0.3 10*3/uL (ref 0.0–0.7)
Eosinophils Relative: 3 % (ref 0–5)
HCT: 40.3 % (ref 39.0–52.0)
MCH: 31.4 pg (ref 26.0–34.0)
MCHC: 35.7 g/dL (ref 30.0–36.0)
MCV: 88 fL (ref 78.0–100.0)
Monocytes Absolute: 0.8 10*3/uL (ref 0.1–1.0)
RDW: 12.5 % (ref 11.5–15.5)

## 2013-01-05 LAB — GLUCOSE, CAPILLARY
Glucose-Capillary: 360 mg/dL — ABNORMAL HIGH (ref 70–99)
Glucose-Capillary: 325 mg/dL — ABNORMAL HIGH (ref 70–99)

## 2013-01-05 LAB — HEMOGLOBIN A1C: Hgb A1c MFr Bld: 9.7 % — ABNORMAL HIGH (ref <5.7)

## 2013-01-05 LAB — TSH: TSH: 0.923 u[IU]/mL (ref 0.350–4.500)

## 2013-01-05 LAB — TROPONIN I: Troponin I: <0.3 ng/mL (ref <0.30)

## 2013-01-05 LAB — PRO B NATRIURETIC PEPTIDE: Pro B Natriuretic peptide (BNP): 201.7 pg/mL — ABNORMAL HIGH (ref 0–125)

## 2013-01-05 LAB — MRSA PCR SCREENING: MRSA by PCR: NEGATIVE

## 2013-01-05 MED ORDER — FLUOXETINE HCL 20 MG PO CAPS
20.0000 mg | ORAL_CAPSULE | Freq: Every day | ORAL | Status: DC
  Administered 2013-01-05 – 2013-01-06 (×2): 20 mg via ORAL
  Filled 2013-01-05 (×2): qty 1

## 2013-01-05 MED ORDER — LABETALOL HCL 5 MG/ML IV SOLN
20.0000 mg | Freq: Once | INTRAVENOUS | Status: AC
  Administered 2013-01-05: 20 mg via INTRAVENOUS
  Filled 2013-01-05: qty 4

## 2013-01-05 MED ORDER — HYDRALAZINE HCL 20 MG/ML IJ SOLN
10.0000 mg | INTRAMUSCULAR | Status: DC | PRN
  Administered 2013-01-05 (×3): 10 mg via INTRAVENOUS
  Filled 2013-01-05 (×3): qty 1

## 2013-01-05 MED ORDER — AMLODIPINE BESYLATE 5 MG PO TABS
10.0000 mg | ORAL_TABLET | Freq: Every day | ORAL | Status: DC
  Administered 2013-01-05 – 2013-01-06 (×2): 10 mg via ORAL
  Filled 2013-01-05 (×3): qty 2

## 2013-01-05 MED ORDER — FUROSEMIDE 10 MG/ML IJ SOLN
40.0000 mg | Freq: Once | INTRAMUSCULAR | Status: AC
  Administered 2013-01-05: 40 mg via INTRAVENOUS
  Filled 2013-01-05: qty 4

## 2013-01-05 MED ORDER — ASPIRIN 325 MG PO TABS
325.0000 mg | ORAL_TABLET | Freq: Every day | ORAL | Status: DC
Start: 2013-01-05 — End: 2013-01-06
  Administered 2013-01-05 – 2013-01-06 (×2): 325 mg via ORAL
  Filled 2013-01-05 (×2): qty 1

## 2013-01-05 MED ORDER — ACETAMINOPHEN 325 MG PO TABS: 650.0000 mg | ORAL_TABLET | ORAL | Status: DC | PRN

## 2013-01-05 MED ORDER — AMOXICILLIN 250 MG PO CAPS: 1000.0000 mg | ORAL_CAPSULE | Freq: Once | ORAL | Status: DC

## 2013-01-05 MED ORDER — AMOXICILLIN 500 MG PO TABS: 1000.0000 mg | ORAL_TABLET | Freq: Two times a day (BID) | ORAL | Status: DC

## 2013-01-05 MED ORDER — IBUPROFEN 600 MG PO TABS: 600.0000 mg | ORAL_TABLET | Freq: Four times a day (QID) | ORAL | Status: DC | PRN

## 2013-01-05 MED ORDER — ENOXAPARIN SODIUM 40 MG/0.4ML ~~LOC~~ SOLN
40.0000 mg | SUBCUTANEOUS | Status: DC
  Administered 2013-01-05 – 2013-01-06 (×2): 40 mg via SUBCUTANEOUS
  Filled 2013-01-05 (×2): qty 0.4

## 2013-01-05 MED ORDER — INSULIN GLARGINE 100 UNIT/ML ~~LOC~~ SOLN
55.0000 [IU] | Freq: Every day | SUBCUTANEOUS | Status: DC
  Administered 2013-01-05: 55 [IU] via SUBCUTANEOUS
  Filled 2013-01-05 (×2): qty 0.55

## 2013-01-05 MED ORDER — FLEET ENEMA 7-19 GM/118ML RE ENEM: 1.0000 | ENEMA | Freq: Once | RECTAL | Status: AC | PRN

## 2013-01-05 MED ORDER — CLONIDINE HCL 0.2 MG PO TABS
0.2000 mg | ORAL_TABLET | Freq: Two times a day (BID) | ORAL | Status: DC
  Administered 2013-01-05 – 2013-01-06 (×3): 0.2 mg via ORAL
  Filled 2013-01-05 (×3): qty 1

## 2013-01-05 MED ORDER — INSULIN ASPART 100 UNIT/ML ~~LOC~~ SOLN
0.0000 [IU] | Freq: Three times a day (TID) | SUBCUTANEOUS | Status: DC
  Administered 2013-01-05: 11 [IU] via SUBCUTANEOUS
  Administered 2013-01-05: 15 [IU] via SUBCUTANEOUS
  Administered 2013-01-05: 5 [IU] via SUBCUTANEOUS
  Administered 2013-01-06: 11 [IU] via SUBCUTANEOUS

## 2013-01-05 MED ORDER — TRAZODONE HCL 50 MG PO TABS
100.0000 mg | ORAL_TABLET | Freq: Every day | ORAL | Status: DC
  Administered 2013-01-05: 100 mg via ORAL
  Filled 2013-01-05: qty 2

## 2013-01-05 MED ORDER — SODIUM CHLORIDE 0.9 % IJ SOLN
3.0000 mL | Freq: Two times a day (BID) | INTRAMUSCULAR | Status: DC
  Administered 2013-01-05 – 2013-01-06 (×3): 3 mL via INTRAVENOUS

## 2013-01-05 MED ORDER — INSULIN ASPART 100 UNIT/ML ~~LOC~~ SOLN: 0.0000 [IU] | Freq: Every day | SUBCUTANEOUS | Status: DC

## 2013-01-05 MED ORDER — INSULIN GLARGINE 100 UNIT/ML ~~LOC~~ SOLN: 55.0000 [IU] | Freq: Every day | SUBCUTANEOUS | Status: DC

## 2013-01-05 MED ORDER — ONDANSETRON HCL 4 MG/2ML IJ SOLN: 4.0000 mg | INTRAMUSCULAR | Status: DC | PRN

## 2013-01-05 MED ORDER — LISINOPRIL 10 MG PO TABS
40.0000 mg | ORAL_TABLET | Freq: Every day | ORAL | Status: DC
  Administered 2013-01-05 – 2013-01-06 (×2): 40 mg via ORAL
  Filled 2013-01-05 (×3): qty 4

## 2013-01-05 MED ORDER — BISACODYL 10 MG RE SUPP: 10.0000 mg | Freq: Every day | RECTAL | Status: DC | PRN

## 2013-01-05 MED ORDER — PANTOPRAZOLE SODIUM 40 MG PO TBEC
40.0000 mg | DELAYED_RELEASE_TABLET | Freq: Every day | ORAL | Status: DC
Start: 2013-01-05 — End: 2013-01-06
  Administered 2013-01-05 – 2013-01-06 (×2): 40 mg via ORAL
  Filled 2013-01-05 (×2): qty 1

## 2013-01-05 MED ORDER — BUSPIRONE HCL 5 MG PO TABS
5.0000 mg | ORAL_TABLET | Freq: Two times a day (BID) | ORAL | Status: DC
  Administered 2013-01-05 – 2013-01-06 (×3): 5 mg via ORAL
  Filled 2013-01-05 (×3): qty 1

## 2013-01-05 MED ORDER — METFORMIN HCL 500 MG PO TABS
500.0000 mg | ORAL_TABLET | Freq: Two times a day (BID) | ORAL | Status: DC
  Administered 2013-01-05 – 2013-01-06 (×3): 500 mg via ORAL
  Filled 2013-01-05 (×3): qty 1

## 2013-01-05 MED ORDER — ASPIRIN 81 MG PO CHEW
324.0000 mg | CHEWABLE_TABLET | Freq: Once | ORAL | Status: AC
  Administered 2013-01-05: 324 mg via ORAL
  Filled 2013-01-05: qty 4

## 2013-01-05 NOTE — Progress Notes (Signed)
UR chart review completed.  

## 2013-01-05 NOTE — Progress Notes (Signed)
Pt transferred to room 311. Report given to Lyda Jester RN.

## 2013-01-05 NOTE — Plan of Care (Signed)
Problem: Consults Goal: General Medical Patient Education See Patient Education Module for specific education. Outcome: Progressing Admitted with hypertensive urgency, went to stay with father, no medication for 2 days.   Goal: Skin Care Protocol Initiated - if Braden Score 18 or less If consults are not indicated, leave blank or document N/A Outcome: Progressing Skin abrasion to Right knee  Problem: Phase I Progression Outcomes Goal: Pain controlled with appropriate interventions Outcome: Progressing Restless legs and pain to left facial area since stroke a few months ago Goal: OOB as tolerated unless otherwise ordered Outcome: Progressing Uses cane.  Admits to falling a week ago and states he falls into things all the time Goal: Initial discharge plan identified Outcome: Progressing home Goal: Hemodynamically stable Outcome: Not Progressing Remains hypertensive at this time.

## 2013-01-05 NOTE — Progress Notes (Signed)
[facility] PROGRESS NOTE  [name] [name]. AVW:[id number] DOB: 11/11/1960 DOA: 01/04/2013 PCP: Leanna Sato, MD  Assessment/Plan: Hypertensive urgency :related to medication non-compliance. Pt reports getting meds "mixed up". Improved now that home regimen started. Still needs hydralazine prn for control. Will continue home regimen with prn hydralazine. Of note, pt given IV lasix in ED and volume status -1.6L. Await echo.  Will monitor closely. Will transfer to tele floor.   Shortness of breath: xray without congestive failure or acute disease. Hyperinflation consistent COPD and/or chronic bronchitis. Resolved this am.   DM (diabetes mellitus), type 2, uncontrolled: A1c pending will continue home lantus and SSI. Appetite excellent. 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 Less anxious this am.  Continue home meds.    Code Status: full Family Communication: none presents Disposition Plan: home when ready likely tomorrow.    Consultants:  none  Procedures:  none  Antibiotics:  none  HPI/Subjective: none  Objective: Filed Vitals:   01/05/13 0606  BP: 166/92  Pulse:   Temp:   Resp:     Intake/Output Summary (Last 24 hours) at 01/05/13 0858 Last data filed at 01/05/13 0400  Gross per 24 hour  Intake      0 ml  Output   1600 ml  Net  -1600 ml   Filed Weights   01/05/13 0003 01/05/13 0342  Weight: 120.203 kg (265 lb) 124.4 kg (274 lb 4 oz)    Exam:   General:  Obese NAD  Cardiovascular: RRR No MGR Trace LE edema  Respiratory: normal effort BS clear but slightly distant. No wheeze no rhonchi  Abdomen: obese soft +BS non-tender to palpation  Musculoskeletal: no clubbing no cyanosis   Data Reviewed: Basic Metabolic Panel:  Recent Labs Lab 01/05/13 0031 01/05/13 0541  NA 134*  --   K 3.8  --   CL 98  --   CO2 25  --   GLUCOSE 359*  --   BUN 25*  --   CREATININE 1.12  --   CALCIUM 9.5  --   MG  --  1.8   Liver Function  Tests:  Recent Labs Lab 01/05/13 0541  AST 16  ALT 15  ALKPHOS 75  BILITOT 0.7  PROT 7.5  ALBUMIN 4.0   No results found for this basename: LIPASE, AMYLASE,  in the last 168 hours No results found for this basename: AMMONIA,  in the last 168 hours CBC:  Recent Labs Lab 01/05/13 0031  WBC 10.3  NEUTROABS 6.3  HGB 14.4  HCT 40.3  MCV 88.0  PLT 183   Cardiac Enzymes:  Recent Labs Lab 01/05/13 0116 01/05/13 0541  TROPONINI <0.30 <0.30   BNP (last 3 results)  Recent Labs  01/05/13 0116  PROBNP 201.7*   CBG:  Recent Labs Lab 01/05/13 0811  GLUCAP 360*    Recent Results (from the past 240 hour(s))  MRSA PCR SCREENING     Status: None   Collection Time    01/05/13  3:22 AM      Result Value Range Status   MRSA by PCR NEGATIVE  NEGATIVE Final   Comment:            The GeneXpert MRSA Assay (FDA     approved for NASAL specimens     only), is one component of a     comprehensive MRSA colonization     surveillance program. It is not     intended to diagnose MRSA  infection nor to guide or     monitor treatment for     MRSA infections.     Studies: Dg Chest 2 View  01/05/2013   CLINICAL DATA:  Anxiety attack with shortness of breath. Hypertension and diabetes. Smoker.  EXAM: CHEST  2 VIEW  COMPARISON:  01/12/2012  FINDINGS: Hyperinflation. Lateral view degraded by patient arm position. Midline trachea. Moderate cardiomegaly with a tortuous thoracic aorta. No pleural effusion or pneumothorax. No congestive failure. Mild chronic interstitial thickening. No lobar consolidation.  IMPRESSION: Cardiomegaly, without congestive failure or acute disease.  Hyperinflation and chronic interstitial thickening, likely related to COPD/chronic bronchitis.   Electronically Signed   By: Jeronimo Greaves M.D.   On: 01/05/2013 01:24    Scheduled Meds: . amLODipine  10 mg Oral Daily  . aspirin  325 mg Oral Daily  . cloNIDine  0.2 mg Oral BID  . enoxaparin (LOVENOX) injection   40 mg Subcutaneous Q24H  . FLUoxetine  20 mg Oral Daily  . insulin aspart  0-15 Units Subcutaneous TID WC  . insulin aspart  0-5 Units Subcutaneous QHS  . insulin glargine  55 Units Subcutaneous Daily  . lisinopril  40 mg Oral Daily  . metFORMIN  500 mg Oral BID WC  . pantoprazole  40 mg Oral Daily  . sodium chloride  3 mL Intravenous Q12H  . traZODone  100 mg Oral QHS   Continuous Infusions:   Principal Problem:   Hypertensive urgency Active Problems:   Shortness of breath   DM (diabetes mellitus), type 2, uncontrolled    Time spent: 30 minutes    Gwenyth Bender  [facility] Pager [phone] If 7PM-7AM, please contact night-coverage at [email or web address], password [facility] 01/05/2013, 8:58 AM  LOS: 1 day

## 2013-01-05 NOTE — ED Provider Notes (Signed)
CSN: [id number]     Arrival date & time 01/04/13  2354 History   First MD Initiated Contact with Patient 01/05/13 0109     Chief Complaint  Patient presents with  . Shortness of Breath   (Consider location/radiation/quality/duration/timing/severity/associated sxs/prior Treatment) Patient is a 52 y.o. male presenting with shortness of breath. The history is provided by the patient.  Shortness of Breath He is complaining of shortness of breath whenever he lays down. This went on for the last 2 days. During that time, he has not been taking his routine medications because he has been staying with his father. Denies any chest pain, heaviness, tightness, pressure. There hasn't paroxysmal nocturnal dyspnea. He denies nausea, vomiting, diaphoresis. He denies chest pain, heaviness, tightness, pressure. He has had congestive heart failure in the past and he states that this feels different and he thinks it is anxiety. He has also noticed some exertional dyspnea.  Past Medical History  Diagnosis Date  . Hypertension   . Diabetes mellitus   . GERD (gastroesophageal reflux disease)   . Pericarditis   . Polysubstance abuse     cocaine, marijuana  . Auditory hallucinations   . Depression   . Hx of medication noncompliance   . History of suicide attempt   . Stroke    Past Surgical History  Procedure Laterality Date  . Pericardial fluid drainage     Family History  Problem Relation Age of Onset  . Stroke Mother   . Diabetes type II Mother   . Hypertension Father   . Arthritis Father   . Arthritis Sister   . Diabetes type II Sister   . Hypertension Sister    History  Substance Use Topics  . Smoking status: Current Every Day Smoker -- 0.33 packs/day  . Smokeless tobacco: Not on file  . Alcohol Use: Yes     Comment: occasionally; reports 1 pint of vodka per month at payday.    Review of Systems  Respiratory: Positive for shortness of breath.   All other systems reviewed and are  negative.    Allergies  Review of patient's allergies indicates no known allergies.  Home Medications   Current Outpatient Rx  Name  Route  Sig  Dispense  Refill  . amLODipine (NORVASC) 10 MG tablet   Oral   Take 10 mg by mouth daily.         Marland Kitchen aspirin (BAYER ASPIRIN) 325 MG tablet   Oral   Take 1 tablet (325 mg total) by mouth daily.   30 tablet   1   . cloNIDine (CATAPRES) 0.2 MG tablet   Oral   Take 0.2 mg by mouth 2 (two) times daily. For Hypertension         . FLUoxetine (PROZAC) 20 MG capsule   Oral   Take 20 mg by mouth daily.         Marland Kitchen HYDROcodone-acetaminophen (NORCO/VICODIN) 5-325 MG per tablet   Oral   Take 2 tablets by mouth every 4 (four) hours as needed for pain.   10 tablet   0   . ibuprofen (ADVIL,MOTRIN) 200 MG tablet   Oral   Take 600 mg by mouth every 6 (six) hours as needed for pain.         Marland Kitchen insulin glargine (LANTUS) 100 UNIT/ML injection   Subcutaneous   Inject 55 Units into the skin at bedtime. For high blood sugar control         . lisinopril (PRINIVIL,ZESTRIL) 40  MG tablet   Oral   Take 40 mg by mouth daily.         . metFORMIN (GLUCOPHAGE) 500 MG tablet   Oral   Take 500 mg by mouth 2 (two) times daily with a meal.         . naproxen (NAPROSYN) 500 MG tablet   Oral   Take 1 tablet (500 mg total) by mouth 2 (two) times daily with a meal.   30 tablet   0   . pantoprazole (PROTONIX) 40 MG tablet   Oral   Take 40 mg by mouth daily.         . traZODone (DESYREL) 100 MG tablet   Oral   Take 100 mg by mouth at bedtime.          BP 189/138  Pulse 79  Temp(Src) 98 F (36.7 C) (Oral)  Resp 20  Ht 5\' 11"  (1.803 m)  Wt 265 lb (120.203 kg)  BMI 36.98 kg/m2  SpO2 97% Physical Exam  Nursing note and vitals reviewed.  52 year old male, resting comfortably and in no acute distress. Vital signs are significant for severe hypertension with blood pressure 189/138. Oxygen saturation is 97%, which is normal. Head is  normocephalic and atraumatic. PERRLA, EOMI. Oropharynx is clear. Neck is nontender and supple without adenopathy or JVD. Back is nontender and there is no CVA tenderness. Lungs are clear without rales, wheezes, or rhonchi. Chest is nontender. Heart has regular rate and rhythm without murmur. Abdomen is soft, flat, nontender without masses or hepatosplenomegaly and peristalsis is normoactive. Extremities have 2+ pretibial edema as well as hopeless presacral edema, full range of motion is present. Skin is warm and dry without rash. Neurologic: Mental status is normal, cranial nerves are intact, there are no motor or sensory deficits.  ED Course  Procedures (including critical care time) Labs Review Results for orders placed during the hospital encounter of 01/04/13  CBC WITH DIFFERENTIAL      Result Value Range   WBC 10.3  4.0 - 10.5 K/uL   RBC 4.58  4.22 - 5.81 MIL/uL   Hemoglobin 14.4  13.0 - 17.0 g/dL   HCT 45.4  09.8 - 11.9 %   MCV 88.0  78.0 - 100.0 fL   MCH 31.4  26.0 - 34.0 pg   MCHC 35.7  30.0 - 36.0 g/dL   RDW 14.7  82.9 - 56.2 %   Platelets 183  150 - 400 K/uL   Neutrophils Relative % 60  43 - 77 %   Neutro Abs 6.3  1.7 - 7.7 K/uL   Lymphocytes Relative 29  12 - 46 %   Lymphs Abs 3.0  0.7 - 4.0 K/uL   Monocytes Relative 8  3 - 12 %   Monocytes Absolute 0.8  0.1 - 1.0 K/uL   Eosinophils Relative 3  0 - 5 %   Eosinophils Absolute 0.3  0.0 - 0.7 K/uL   Basophils Relative 0  0 - 1 %   Basophils Absolute 0.0  0.0 - 0.1 K/uL  BASIC METABOLIC PANEL      Result Value Range   Sodium 134 (*) 135 - 145 mEq/L   Potassium 3.8  3.5 - 5.1 mEq/L   Chloride 98  96 - 112 mEq/L   CO2 25  19 - 32 mEq/L   Glucose, Bld 359 (*) 70 - 99 mg/dL   BUN 25 (*) 6 - 23 mg/dL   Creatinine, Ser 1.30  0.50 - 1.35 mg/dL   Calcium 9.5  8.4 - 78.2 mg/dL   GFR calc non Af Amer 74 (*) >90 mL/min   GFR calc Af Amer 86 (*) >90 mL/min  PRO B NATRIURETIC PEPTIDE      Result Value Range   Pro B  Natriuretic peptide (BNP) 201.7 (*) 0 - 125 pg/mL  TROPONIN I      Result Value Range   Troponin I <0.30  <0.30 ng/mL   Imaging Review Dg Chest 2 View  01/05/2013   CLINICAL DATA:  Anxiety attack with shortness of breath. Hypertension and diabetes. Smoker.  EXAM: CHEST  2 VIEW  COMPARISON:  01/12/2012  FINDINGS: Hyperinflation. Lateral view degraded by patient arm position. Midline trachea. Moderate cardiomegaly with a tortuous thoracic aorta. No pleural effusion or pneumothorax. No congestive failure. Mild chronic interstitial thickening. No lobar consolidation.  IMPRESSION: Cardiomegaly, without congestive failure or acute disease.  Hyperinflation and chronic interstitial thickening, likely related to COPD/chronic bronchitis.   Electronically Signed   By: Jeronimo Greaves M.D.   On: 01/05/2013 01:24    EKG Interpretation    Date/Time:  Tuesday January 05 2013 00:28:19 EST Ventricular Rate:  73 PR Interval:  164 QRS Duration: 100 QT Interval:  430 QTC Calculation: 473 R Axis:   -44 Text Interpretation:  Normal sinus rhythm Biatrial enlargement Left axis deviation Pulmonary disease pattern T wave abnormality, consider lateral ischemia Prolonged QT Abnormal ECG When compared with ECG of 01-Dec-2012 10:22, Criteria for Septal infarct are no longer Present T wave inversion less evident in Lateral leads Confirmed by [facility]  MD, Manjot Beumer ([id number]) on 01/05/2013 12:42:55 AM           CRITICAL CARE Performed by: NFAOZ,HYQMV Total critical care time: 35 minutes Critical care time was exclusive of separately billable procedures and treating other patients. Critical care was necessary to treat or prevent imminent or life-threatening deterioration. Critical care was time spent personally by me on the following activities: development of treatment plan with patient and/or surrogate as well as nursing, discussions with consultants, evaluation of patient's response to treatment, examination of patient,  obtaining history from patient or surrogate, ordering and performing treatments and interventions, ordering and review of laboratory studies, ordering and review of radiographic studies, pulse oximetry and re-evaluation of patient's condition.  MDM   1. Hypertensive urgency   2. Hyperglycemia    Dyspnea which seems most consistent with congestive heart failure. Presence of peripheral edema is also consistent with congestive heart failure. Severity of hypertension is concern for hypertensive urgency. Screening labs were obtained and he is given a dose of aspirin and furosemide. Old records are reviewed and he has been hospitalized for panic attacks but also for hypertensive encephalopathy.  He had good urine output with furosemide, but still is orthopneic on lying down and blood pressure has not come down. His given dose of labetalol for blood pressure control. Case is discussed with Dr. Orvan Falconer [facility] who agrees to admit the patient.  Dione Booze, MD 01/05/13 [phone]

## 2013-01-05 NOTE — ED Notes (Addendum)
Documented wrong pt.

## 2013-01-05 NOTE — H&P (Signed)
[facility] History and Physical  [name] [name].  WGN:[id number]  DOB: [birth year]   DOA: 01/05/2013   PCP:   Leanna Sato, MD   Chief Complaint:  Shortness of breath on exertion for 2 days  HPI: [name] [name]. is a 52 y.o. male.   Obese African American gentleman with controlled psychosis, and hypertension, visited his father over the weekend, did not take his medication, so has been without his medications since Saturday. He came to the emergency room yesterday evening complaining of shortness of breath with exertion and was noted to have a markedly elevated blood pressure. He is admitted for hypertensive urgency     R   Past Medical History  Diagnosis Date  . Hypertension   . Diabetes mellitus   . GERD (gastroesophageal reflux disease)   . Pericarditis   . Polysubstance abuse     cocaine, marijuana  . Auditory hallucinations   . Depression   . Hx of medication noncompliance   . History of suicide attempt   . Stroke     Past Surgical History  Procedure Laterality Date  . Pericardial fluid drainage      Medications:  HOME MEDS: Prior to Admission medications   Medication Sig Start Date End Date Taking? Authorizing Provider  amLODipine (NORVASC) 10 MG tablet Take 10 mg by mouth daily.   Yes Historical Provider, MD  aspirin (BAYER ASPIRIN) 325 MG tablet Take 1 tablet (325 mg total) by mouth daily. 12/01/12   Vida Roller, MD  cloNIDine (CATAPRES) 0.2 MG tablet Take 0.2 mg by mouth 2 (two) times daily. For Hypertension 01/15/12   Sanjuana Kava, NP  FLUoxetine (PROZAC) 20 MG capsule Take 20 mg by mouth daily.    Historical Provider, MD  HYDROcodone-acetaminophen (NORCO/VICODIN) 5-325 MG per tablet Take 2 tablets by mouth every 4 (four) hours as needed for pain. 12/01/12   Vida Roller, MD  ibuprofen (ADVIL,MOTRIN) 200 MG tablet Take 600 mg by mouth every 6 (six) hours as needed for pain.    Historical Provider, MD  insulin glargine (LANTUS) 100 UNIT/ML  injection Inject 55 Units into the skin at bedtime. For high blood sugar control 01/15/12   Sanjuana Kava, NP  lisinopril (PRINIVIL,ZESTRIL) 40 MG tablet Take 40 mg by mouth daily.    Historical Provider, MD  metFORMIN (GLUCOPHAGE) 500 MG tablet Take 500 mg by mouth 2 (two) times daily with a meal.    Historical Provider, MD  naproxen (NAPROSYN) 500 MG tablet Take 1 tablet (500 mg total) by mouth 2 (two) times daily with a meal. 12/01/12   Vida Roller, MD  pantoprazole (PROTONIX) 40 MG tablet Take 40 mg by mouth daily.    Historical Provider, MD  traZODone (DESYREL) 100 MG tablet Take 100 mg by mouth at bedtime.    Historical Provider, MD     Allergies:  No Known Allergies  Social History:   reports that he has been smoking.  He does not have any smokeless tobacco history on file. He reports that he drinks alcohol. He reports that he uses illicit drugs (Marijuana and Cocaine) about once per week.  Family History: Family History  Problem Relation Age of Onset  . Stroke Mother   . Diabetes type II Mother   . Hypertension Father   . Arthritis Father   . Arthritis Sister   . Diabetes type II Sister   . Hypertension Sister      Physical Exam: Filed Vitals:  01/05/13 0237 01/05/13 0300 01/05/13 0342 01/05/13 0400  BP: 172/118 174/118  161/117  Pulse: 79 80 79   Temp:   98.6 F (37 C)   TempSrc:   Oral   Resp: 18   20  Height:   5\' 11"  (1.803 m)   Weight:   124.4 kg (274 lb 4 oz)   SpO2: 94% 93% 93%    Blood pressure 161/117, pulse 79, temperature 98.6 F (37 C), temperature source Oral, resp. rate 20, height 5\' 11"  (1.803 m), weight 124.4 kg (274 lb 4 oz), SpO2 93.00%. Body mass index is 38.27 kg/(m^2).   GEN:  Pleasant obese African American gentleman lying bed in no acute distress; cooperative with exam PSYCH:  alert and oriented x4;  somewhat anxious; affect is appropriate. HEENT: Mucous membranes pink and anicteric; PERRLA; EOM intact; no cervical lymphadenopathy nor  thyromegaly  Breasts:: Not examined CHEST WALL: No tenderness CHEST: Normal respiration, clear to auscultation bilaterally HEART: Regular rate and rhythm; no murmurs rubs or gallops BACK: No kyphosis no scoliosis; no CVA tenderness ABDOMEN: Obese, soft non-tender; no masses, no organomegaly, normal abdominal bowel sounds; no pannus; no intertriginous candida. Rectal Exam: Not done EXTREMITIES:  age-appropriate arthropathy of the hands and knees; trace edema edema; no ulcerations. Genitalia: not examined PULSES: 2+ and symmetric SKIN: Normal hydration no rash or ulceration CNS: Cranial nerves 2-12 grossly intact no focal lateralizing neurologic deficit   Labs on Admission:  Basic Metabolic Panel:  Recent Labs Lab 01/05/13 0031  NA 134*  K 3.8  CL 98  CO2 25  GLUCOSE 359*  BUN 25*  CREATININE 1.12  CALCIUM 9.5   Liver Function Tests: No results found for this basename: AST, ALT, ALKPHOS, BILITOT, PROT, ALBUMIN,  in the last 168 hours No results found for this basename: LIPASE, AMYLASE,  in the last 168 hours No results found for this basename: AMMONIA,  in the last 168 hours CBC:  Recent Labs Lab 01/05/13 0031  WBC 10.3  NEUTROABS 6.3  HGB 14.4  HCT 40.3  MCV 88.0  PLT 183   Cardiac Enzymes:  Recent Labs Lab 01/05/13 0116  TROPONINI <0.30   BNP: No components found with this basename: POCBNP,  D-dimer: No components found with this basename: D-DIMER,  CBG: No results found for this basename: GLUCAP,  in the last 168 hours  Radiological Exams on Admission: Dg Chest 2 View  01/05/2013   CLINICAL DATA:  Anxiety attack with shortness of breath. Hypertension and diabetes. Smoker.  EXAM: CHEST  2 VIEW  COMPARISON:  01/12/2012  FINDINGS: Hyperinflation. Lateral view degraded by patient arm position. Midline trachea. Moderate cardiomegaly with a tortuous thoracic aorta. No pleural effusion or pneumothorax. No congestive failure. Mild chronic interstitial thickening.  No lobar consolidation.  IMPRESSION: Cardiomegaly, without congestive failure or acute disease.  Hyperinflation and chronic interstitial thickening, likely related to COPD/chronic bronchitis.   Electronically Signed   By: Jeronimo Greaves M.D.   On: 01/05/2013 01:24    EKG: Independently reviewed. Sinus rhythm, biatrial enlargement, pulmonary disease pattern   Assessment/Plan   Active Problems:   Shortness of breath   Hypertensive urgency   DM (diabetes mellitus), type 2, uncontrolled Psychotic disorder  PLAN: Antihypertensive medication Recognize that as blood pressure was lowered symptoms of anxiety will solve PRN hydralazine while oral medications take effect Resume antipsychotic medication Sliding scale insulin to assist with diabetes control Other plans as per orders.  Code Status: Full code   Cederick Broadnax Nocturnist [facility]  Pager [phone]  01/05/2013, 4:36 AM

## 2013-01-05 NOTE — Progress Notes (Signed)
Inpatient Diabetes Program Recommendations  AACE/ADA: New Consensus Statement on Inpatient Glycemic Control (2013)  Target Ranges:  Prepandial:   less than 140 mg/dL      Peak postprandial:   less than 180 mg/dL (1-2 hours)      Critically ill patients:  140 - 180 mg/dL   Results for [name] [name], [name] [name] (MRN [id number]) as of 01/05/2013 08:21  Ref. Range 01/05/2013 00:31  Glucose Latest Range: 70-99 mg/dL 409 (H)    Results for [name] [name], [name] [name] (MRN [id number]) as of 01/05/2013 08:21  Ref. Range 01/05/2013 08:11  Glucose-Capillary Latest Range: 70-99 mg/dL 956 (H)   Inpatient Diabetes Program Recommendations Insulin - Basal: May want to consider changing timing of basal insulin to daily since patient has not taken any basal insulin since 11/30.  Anticipate blood glucose to remian elevated through out the day unless basal insulin is given this morning.  Note: Patient has a history of diabetes and is prescribed Lantus 55 units QHS and Metformin 500 mg BID as an outpatient for diabetes management.  Currently, patient is ordered to receive Lantus 55 units QHS, Novolog 0-15 units AC, Novolog 0-5 units HS, and Metformin 500 mg BID for inpatient glycemic control.  According to the H&P, patient went to visit his father over the weekend and forgot to take his medications with him.  Therefore, he has been without any medication since Saturday.  Initial lab glucose noted to be 359 mg/dl on 21/3 @ 08:65.  Anticipate blood glucose to remain elevated throughout the day unless basal insulin is given this morning.  May want to change timing of basal insulin from QHS to daily (to be given at 10am).  Will continue to follow.  Thanks, Orlando Penner, RN, MSN, CCRN Diabetes Coordinator Inpatient Diabetes Program [phone] (Team Pager) [phone] ([facility] office) [phone] [facility] office)

## 2013-01-05 NOTE — Progress Notes (Signed)
Pt seen and examined. Agree with assessment and plan per Toya Smothers, NP. Pt presents with hypertensive crisis. BP improved, but still requiring PRN meds. Cont med adjustments as needed. Poss home soon if BP remains stable overnight.

## 2013-01-05 NOTE — Care Management Note (Addendum)
    Page 1 of 2   01/06/2013     10:40:50 AM   CARE MANAGEMENT NOTE 01/06/2013  Patient:  [name] [name],[name] [name]   Account Number:  [id number]  Date Initiated:  01/05/2013  Documentation initiated by:  Sharrie Rothman  Subjective/Objective Assessment:   Pt admitted from home with HTN urgency. Pt lives alone in [place] and will return home at discharge. Pt is fairly independent with ADL's. Pt uses public transportation in [place] for MD appts.     Action/Plan:   Pt did ask for RN at discharge for medication management assistance. Pt did choose [facility] at discharge. Alroy Bailiff of [facility] is aware.   Anticipated DC Date:  01/07/2013   Anticipated DC Plan:  HOME W HOME HEALTH SERVICES      DC Planning Services  CM consult      [facility] Choice  HOME HEALTH   Choice offered to / List presented to:  C-1 Patient        HH arranged  HH-1 RN      [facility] agency  [facility]   Status of service:  Completed, signed off Medicare Important Message given?   (If response is "NO", the following Medicare IM given date fields will be blank) Date Medicare IM given:   Date Additional Medicare IM given:    Discharge Disposition:  HOME W HOME HEALTH SERVICES  Per UR Regulation:    If discussed at Long Length of Stay Meetings, dates discussed:    Comments:  01/06/13 1040 Arlyss Queen, RN BSN CM pt discharged home today with [facility] RN. Alroy Bailiff of [facility] aware and will collect the pts information from the chart. HH services to start within 48 hours of discharge. Pt and pts nurse aware of discharge arrangements.  01/05/13 1420 Arlyss Queen, RN BSN CM

## 2013-01-05 NOTE — Progress Notes (Signed)
*  PRELIMINARY RESULTS* Echocardiogram 2D Echocardiogram has been performed.  [name] [name] 01/05/2013, 12:18 PM

## 2013-01-06 DIAGNOSIS — F1910 Other psychoactive substance abuse, uncomplicated: Secondary | ICD-10-CM

## 2013-01-06 DIAGNOSIS — F19988 Other psychoactive substance use, unspecified with other psychoactive substance-induced disorder: Secondary | ICD-10-CM

## 2013-01-06 LAB — CBC
HCT: 42 % (ref 39.0–52.0)
MCH: 31.1 pg (ref 26.0–34.0)
MCHC: 35.2 g/dL (ref 30.0–36.0)
MCV: 88.2 fL (ref 78.0–100.0)
Platelets: 199 10*3/uL (ref 150–400)
RBC: 4.76 MIL/uL (ref 4.22–5.81)
RDW: 12.4 % (ref 11.5–15.5)

## 2013-01-06 LAB — URINALYSIS, ROUTINE W REFLEX MICROSCOPIC
Bilirubin Urine: NEGATIVE
Hgb urine dipstick: NEGATIVE
Nitrite: NEGATIVE
Protein, ur: NEGATIVE mg/dL
Urobilinogen, UA: 0.2 mg/dL (ref 0.0–1.0)

## 2013-01-06 LAB — RAPID URINE DRUG SCREEN, HOSP PERFORMED
Cocaine: POSITIVE — AB
Opiates: NOT DETECTED
Tetrahydrocannabinol: NOT DETECTED

## 2013-01-06 LAB — BASIC METABOLIC PANEL
BUN: 21 mg/dL (ref 6–23)
CO2: 26 mEq/L (ref 19–32)
Calcium: 9.1 mg/dL (ref 8.4–10.5)
Creatinine, Ser: 1.14 mg/dL (ref 0.50–1.35)
Glucose, Bld: 234 mg/dL — ABNORMAL HIGH (ref 70–99)
Sodium: 135 mEq/L (ref 135–145)

## 2013-01-06 MED ORDER — HYDRALAZINE HCL 50 MG PO TABS: 50.0000 mg | ORAL_TABLET | Freq: Three times a day (TID) | ORAL | Status: DC

## 2013-01-06 MED ORDER — LABETALOL HCL 200 MG PO TABS
100.0000 mg | ORAL_TABLET | Freq: Two times a day (BID) | ORAL | Status: DC
  Administered 2013-01-06: 100 mg via ORAL
  Filled 2013-01-06: qty 1

## 2013-01-06 MED ORDER — LABETALOL HCL 100 MG PO TABS: 100.0000 mg | ORAL_TABLET | Freq: Two times a day (BID) | ORAL | Status: DC

## 2013-01-06 MED ORDER — LABETALOL HCL 200 MG PO TABS: 200.0000 mg | ORAL_TABLET | Freq: Two times a day (BID) | ORAL | Status: DC

## 2013-01-06 MED ORDER — BUSPIRONE HCL 5 MG PO TABS: 5.0000 mg | ORAL_TABLET | Freq: Two times a day (BID) | ORAL | Status: DC

## 2013-01-06 NOTE — Discharge Summary (Signed)
Patient seen and examined, agree with not as above per Toya Smothers, NP.  Patient was admitted with hypertensive urgency. He is on multiple antihypertensive agents including clonidine. Patient was restarted on his home regimen and his blood pressure did somewhat improve. Labetalol has also been added to his antihypertensive regimen and will need to be titrated as an outpatient. I explained to him the importance of compliance with medications especially with clonidine. It would not be an ideal choice for this patient who has issues with compliance, but I also explained that it could not be stopped suddenly. This will need to be weaned off by his primary care physician. He reports that he has an appointment later this week to see his primary care doctor and will discuss coming off of clonidine as well as further adjustments of the remainder of his antihypertensives medications. The patient is a bleeding also the difficulty and is anxious to discharge home. He is felt stable to be discharged home today.  Jarold Macomber

## 2013-01-06 NOTE — Discharge Summary (Addendum)
Physician Discharge Summary  [name] [name]. ZOX:[id number] DOB: 1961/01/18 DOA: 01/04/2013  PCP: Leanna Sato, MD  Admit date: 01/04/2013 Discharge date: 01/06/2013  Time spent: 40 minutes  Recommendations for Outpatient Follow-up:  1. PCP 1 week for evaluation of BP and BP medications as well as compliance. Follow echo results  Discharge Diagnoses:  Principal Problem:   Hypertensive urgency Active Problems:   Shortness of breath   DM (diabetes mellitus), type 2, uncontrolled   Discharge Condition: stable  Diet recommendation: carb modified  Filed Weights   01/05/13 0003 01/05/13 0342 01/06/13 0408  Weight: 120.203 kg (265 lb) 124.4 kg (274 lb 4 oz) 124.739 kg (275 lb)    History of present illness:  [name] Dvonte Gatliff. is a 52 y.o. obese African American gentleman with controlled psychosis, and hypertension, visited his father 11/28, did not take his medication, so had been without his medications for 3 days. He came to the emergency room 12/1 evening complaining of shortness of breath with exertion and was noted to have a markedly elevated blood pressure. [place] admitted for hypertensive urgency  Hospital Course:  Hypertensive urgency :related to medication non-compliance. Admitted to ICU and home medications resumed with prn hydralazine.  BP quickly improved and pt transferred to floor. Managed fair control with home regime so added labatelol 100mg  BID for optimal control. Recommend close OP follow up for trending of BP. Pt educated to importance of compliance particularly the clonidine. Of note, pt given IV lasix in ED and volume status -1.6L. Obtained echo which yields severe concentric hypertrophy, EF 60% and grade 2 diastolic dysfunction.    Shortness of breath: xray without congestive failure or acute disease. Hyperinflation consistent COPD and/or chronic bronchitis. 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   Procedures: none Consultations:  none  Discharge Exam: Filed Vitals:   01/06/13 1013  BP: 170/11  Pulse: 89  Temp: 98.5 F (36.9 C)  Resp: 20    General: calm comfortable NAD Cardiovascular: RRR No MGR Trace LE edema Respiratory: normal effort BS clear bilaterally no wheeze  Discharge Instructions  Discharge Orders   Future Orders Complete By Expires   Diet - low sodium heart healthy  As directed    Discharge instructions  As directed    Comments:     Take medications as directed Follow up with PCP as scheduled for evaluation of symptoms   Increase activity slowly  As directed        Medication List         amLODipine 10 MG tablet  Commonly known as:  NORVASC  Take 10 mg by mouth daily.     aspirin 325 MG tablet  Commonly known as:  BAYER ASPIRIN  Take 1 tablet (325 mg total) by mouth daily.     busPIRone 5 MG tablet  Commonly known as:  BUSPAR  Take 1 tablet (5 mg total) by mouth 2 (two) times daily.     cloNIDine 0.2 MG tablet  Commonly known as:  CATAPRES  Take 0.2 mg by mouth 2 (two) times daily. For Hypertension     FLUoxetine 20 MG capsule  Commonly known as:  PROZAC  Take 20 mg by mouth daily.     HYDROcodone-acetaminophen 5-325 MG per tablet  Commonly known as:  NORCO/VICODIN  Take 2 tablets by mouth every 4 (four) hours as needed for pain.  ibuprofen 200 MG tablet  Commonly known as:  ADVIL,MOTRIN  Take 600 mg by mouth every 6 (six) hours as needed for pain.     insulin glargine 100 UNIT/ML injection  Commonly known as:  LANTUS  Inject 55 Units into the skin at bedtime. For high blood sugar control     labetalol 100 MG tablet  Commonly known as:  NORMODYNE  Take 1 tablet (100 mg total) by mouth 2 (two) times daily.     lisinopril 40 MG tablet  Commonly known as:  PRINIVIL,ZESTRIL  Take 40 mg by mouth daily.     metFORMIN 500 MG tablet  Commonly known as:   GLUCOPHAGE  Take 500 mg by mouth 2 (two) times daily with a meal.     naproxen 500 MG tablet  Commonly known as:  NAPROSYN  Take 1 tablet (500 mg total) by mouth 2 (two) times daily with a meal.     pantoprazole 40 MG tablet  Commonly known as:  PROTONIX  Take 40 mg by mouth daily.     traZODone 100 MG tablet  Commonly known as:  DESYREL  Take 100 mg by mouth at bedtime.       No Known Allergies     Follow-up Information   Follow up with Leanna Sato, MD.   Specialty:  Family Medicine   Contact information:   [facility] ([facility]) [address] [place] [address] [phone]       Follow up with Leanna Sato, MD. Schedule an appointment as soon as possible for a visit in 1 week. (for evlaution of symptoms)    Specialty:  Family Medicine   Contact information:   [facility] ([facility]) [address] [place] [address] [phone]        The results of significant diagnostics from this hospitalization (including imaging, microbiology, ancillary and laboratory) are listed below for reference.    Significant Diagnostic Studies: Dg Chest 2 View  01/05/2013   CLINICAL DATA:  Anxiety attack with shortness of breath. Hypertension and diabetes. Smoker.  EXAM: CHEST  2 VIEW  COMPARISON:  01/12/2012  FINDINGS: Hyperinflation. Lateral view degraded by patient arm position. Midline trachea. Moderate cardiomegaly with a tortuous thoracic aorta. No pleural effusion or pneumothorax. No congestive failure. Mild chronic interstitial thickening. No lobar consolidation.  IMPRESSION: Cardiomegaly, without congestive failure or acute disease.  Hyperinflation and chronic interstitial thickening, likely related to COPD/chronic bronchitis.   Electronically Signed   By: Jeronimo Greaves M.D.   On: 01/05/2013 01:24    Microbiology: Recent Results (from the past 240 hour(s))  MRSA PCR SCREENING     Status: None   Collection Time    01/05/13  3:22 AM      Result  Value Range Status   MRSA by PCR NEGATIVE  NEGATIVE Final   Comment:            The GeneXpert MRSA Assay (FDA     approved for NASAL specimens     only), is one component of a     comprehensive MRSA colonization     surveillance program. It is not     intended to diagnose MRSA     infection nor to guide or     monitor treatment for     MRSA infections.     Labs: Basic Metabolic Panel:  Recent Labs Lab 01/05/13 0031 01/05/13 0541 01/06/13 0508  NA 134*  --  135  K 3.8  --  3.4*  CL 98  --  99  CO2 25  --  26  GLUCOSE 359*  --  234*  BUN 25*  --  21  CREATININE 1.12  --  1.14  CALCIUM 9.5  --  9.1  MG  --  1.8  --    Liver Function Tests:  Recent Labs Lab 01/05/13 0541  AST 16  ALT 15  ALKPHOS 75  BILITOT 0.7  PROT 7.5  ALBUMIN 4.0   No results found for this basename: LIPASE, AMYLASE,  in the last 168 hours No results found for this basename: AMMONIA,  in the last 168 hours CBC:  Recent Labs Lab 01/05/13 0031 01/06/13 0508  WBC 10.3 11.0*  NEUTROABS 6.3  --   HGB 14.4 14.8  HCT 40.3 42.0  MCV 88.0 88.2  PLT 183 199   Cardiac Enzymes:  Recent Labs Lab 01/05/13 0116 01/05/13 0541 01/05/13 1040 01/05/13 1652  TROPONINI <0.30 <0.30 <0.30 <0.30   BNP: BNP (last 3 results)  Recent Labs  01/05/13 0116  PROBNP 201.7*   CBG:  Recent Labs Lab 01/05/13 0811 01/05/13 1217 01/05/13 1616 01/06/13 0759  GLUCAP 360* 325* 208* 318*       Signed:  BLACK,KAREN M  [facility] 01/06/2013, 10:14 AM    Addendum:  After review of the patient's lab work, it was noted that his urine tox screen was positive for cocaine. For this reason, I have called his pharmacy and discontinued his labetalol (he has not picked it up yet). In place of the labetalol, I have called in a prescription for hydralazine. I attempted to call the patient to inform him of this change, but unfortunately was unable to reach him at any numbers listed in the system. I  called his daughter and asked her to pass on this message. She reports that the patient often calls her and she can pass on this message to him.   Addalynne Golding

## 2013-01-06 NOTE — Clinical Social Work Note (Signed)
CSW arranged transport via [employer]. Pt states he has no ride or money. CSW called [facility] transport and they are unable to take pt as he has not been set up with them. Ride approved by [employer]. ED log filled out.  Derenda Fennel, [place] [phone]

## 2013-01-06 NOTE — Progress Notes (Signed)
Patient received discharge instructions along with follow up appointments and prescriptions. Patient verbalized understanding of all instructions. Patient was escorted by staff via wheelchair to vehicle. Patient discharged to home in stable condition. 

## 2013-01-06 NOTE — Progress Notes (Signed)
Inpatient Diabetes Program Recommendations  AACE/ADA: New Consensus Statement on Inpatient Glycemic Control (2013)  Target Ranges:  Prepandial:   less than 140 mg/dL      Peak postprandial:   less than 180 mg/dL (1-2 hours)      Critically ill patients:  140 - 180 mg/dL   Results for [name] [name], [name] [name] (MRN [id number]) as of 01/06/2013 09:09  Ref. Range 01/05/2013 12:17 01/05/2013 16:16 01/06/2013 07:59  Glucose-Capillary Latest Range: 70-99 mg/dL 147 (H) 829 (H) 562 (H)    Inpatient Diabetes Program Recommendations Insulin - Basal: Please consider increasing Lantus to 70 units daily. Correction (SSI): Pleae consider increasing Novolog correction to resistant scale. HgbA1C: A1C on 01/05/13 was 9.7%.  Thanks, Orlando Penner, RN, MSN, CCRN Diabetes Coordinator Inpatient Diabetes Program [phone] (Team Pager) [phone] ([facility] office) [phone] [facility] office)

## 2013-03-07 DIAGNOSIS — I119 Hypertensive heart disease without heart failure: Secondary | ICD-10-CM

## 2013-03-07 DIAGNOSIS — I350 Nonrheumatic aortic (valve) stenosis: Secondary | ICD-10-CM

## 2013-03-07 DIAGNOSIS — I639 Cerebral infarction, unspecified: Secondary | ICD-10-CM

## 2013-03-07 HISTORY — DX: Hypertensive heart disease without heart failure: I11.9

## 2013-03-07 HISTORY — DX: Nonrheumatic aortic (valve) stenosis: I35.0

## 2013-03-07 HISTORY — DX: Cerebral infarction, unspecified: I63.9

## 2013-03-22 LAB — CBC WITH DIFFERENTIAL/PLATELET
Basophil #: 0 10*3/uL (ref 0.0–0.1)
Basophil %: 0.4 %
EOS ABS: 0 10*3/uL (ref 0.0–0.7)
EOS PCT: 0.4 %
HCT: 44.2 % (ref 40.0–52.0)
HGB: 15.4 g/dL (ref 13.0–18.0)
LYMPHS ABS: 1.6 10*3/uL (ref 1.0–3.6)
LYMPHS PCT: 17.9 %
MCH: 31.4 pg (ref 26.0–34.0)
MCHC: 34.8 g/dL (ref 32.0–36.0)
MCV: 90 fL (ref 80–100)
Monocyte #: 1.1 x10 3/mm — ABNORMAL HIGH (ref 0.2–1.0)
Monocyte %: 12.5 %
Neutrophil #: 6.1 10*3/uL (ref 1.4–6.5)
Neutrophil %: 68.8 %
Platelet: 179 10*3/uL (ref 150–440)
RBC: 4.9 10*6/uL (ref 4.40–5.90)
RDW: 13 % (ref 11.5–14.5)
WBC: 8.9 10*3/uL (ref 3.8–10.6)

## 2013-03-22 LAB — COMPREHENSIVE METABOLIC PANEL
ALT: 46 U/L (ref 12–78)
ANION GAP: 4 — AB (ref 7–16)
Albumin: 3.2 g/dL — ABNORMAL LOW (ref 3.4–5.0)
Alkaline Phosphatase: 72 U/L
BUN: 21 mg/dL — ABNORMAL HIGH (ref 7–18)
Bilirubin,Total: 0.6 mg/dL (ref 0.2–1.0)
CREATININE: 1.62 mg/dL — AB (ref 0.60–1.30)
Calcium, Total: 7.8 mg/dL — ABNORMAL LOW (ref 8.5–10.1)
Chloride: 97 mmol/L — ABNORMAL LOW (ref 98–107)
Co2: 29 mmol/L (ref 21–32)
EGFR (African American): 56 — ABNORMAL LOW
EGFR (Non-African Amer.): 48 — ABNORMAL LOW
Glucose: 318 mg/dL — ABNORMAL HIGH (ref 65–99)
Osmolality: 276 (ref 275–301)
Potassium: 3.7 mmol/L (ref 3.5–5.1)
SGOT(AST): 36 U/L (ref 15–37)
Sodium: 130 mmol/L — ABNORMAL LOW (ref 136–145)
Total Protein: 6.8 g/dL (ref 6.4–8.2)

## 2013-03-22 LAB — TROPONIN I
TROPONIN-I: 0.06 ng/mL — AB
TROPONIN-I: 0.05 ng/mL
Troponin-I: 0.05 ng/mL

## 2013-03-22 LAB — PRO B NATRIURETIC PEPTIDE: B-Type Natriuretic Peptide: 152 pg/mL — ABNORMAL HIGH (ref 0–125)

## 2013-03-23 ENCOUNTER — Inpatient Hospital Stay: Payer: Self-pay | Admitting: Internal Medicine

## 2013-03-23 DIAGNOSIS — I359 Nonrheumatic aortic valve disorder, unspecified: Secondary | ICD-10-CM

## 2013-03-23 HISTORY — PX: OTHER SURGICAL HISTORY: SHX169

## 2013-03-23 HISTORY — PX: TRANSTHORACIC ECHOCARDIOGRAM: SHX275

## 2013-03-23 LAB — DRUG SCREEN, URINE
Amphetamines, Ur Screen: NEGATIVE (ref ?–1000)
BENZODIAZEPINE, UR SCRN: NEGATIVE (ref ?–200)
Barbiturates, Ur Screen: NEGATIVE (ref ?–200)
COCAINE METABOLITE, UR ~~LOC~~: POSITIVE (ref ?–300)
Cannabinoid 50 Ng, Ur ~~LOC~~: NEGATIVE (ref ?–50)
MDMA (Ecstasy)Ur Screen: POSITIVE (ref ?–500)
Methadone, Ur Screen: NEGATIVE (ref ?–300)
Opiate, Ur Screen: NEGATIVE (ref ?–300)
PHENCYCLIDINE (PCP) UR S: NEGATIVE (ref ?–25)
Tricyclic, Ur Screen: NEGATIVE (ref ?–1000)

## 2013-03-23 LAB — URINALYSIS, COMPLETE
BILIRUBIN, UR: NEGATIVE
Blood: NEGATIVE
Ketone: NEGATIVE
LEUKOCYTE ESTERASE: NEGATIVE
Nitrite: NEGATIVE
PH: 5 (ref 4.5–8.0)
PROTEIN: NEGATIVE
RBC, UR: NONE SEEN /HPF (ref 0–5)
Specific Gravity: 1.013 (ref 1.003–1.030)
WBC UR: <1 /HPF (ref 0–5)

## 2013-03-23 LAB — BASIC METABOLIC PANEL
ANION GAP: 6 — AB (ref 7–16)
BUN: 22 mg/dL — ABNORMAL HIGH (ref 7–18)
CALCIUM: 7.8 mg/dL — AB (ref 8.5–10.1)
CHLORIDE: 101 mmol/L (ref 98–107)
Co2: 26 mmol/L (ref 21–32)
Creatinine: 1.45 mg/dL — ABNORMAL HIGH (ref 0.60–1.30)
EGFR (African American): >60
EGFR (Non-African Amer.): 55 — ABNORMAL LOW
GLUCOSE: 164 mg/dL — AB (ref 65–99)
Osmolality: 273 (ref 275–301)
Potassium: 3 mmol/L — ABNORMAL LOW (ref 3.5–5.1)
SODIUM: 133 mmol/L — AB (ref 136–145)

## 2013-03-23 LAB — LIPID PANEL
Cholesterol: 107 mg/dL (ref 0–200)
HDL Cholesterol: 20 mg/dL — ABNORMAL LOW (ref 40–60)
Ldl Cholesterol, Calc: 66 mg/dL (ref 0–100)
TRIGLYCERIDES: 107 mg/dL (ref 0–200)
VLDL Cholesterol, Calc: 21 mg/dL (ref 5–40)

## 2013-03-23 LAB — SEDIMENTATION RATE: Erythrocyte Sed Rate: 5 mm/hr (ref 0–20)

## 2013-03-23 LAB — HEMOGLOBIN A1C: Hemoglobin A1C: 10.1 % — ABNORMAL HIGH (ref 4.2–6.3)

## 2013-03-23 LAB — TSH: Thyroid Stimulating Horm: 1.58 u[IU]/mL

## 2013-04-04 DEATH — deceased

## 2013-04-06 ENCOUNTER — Encounter: Payer: Self-pay | Admitting: *Deleted

## 2013-04-07 ENCOUNTER — Encounter: Payer: Self-pay | Admitting: Cardiology

## 2013-04-07 ENCOUNTER — Ambulatory Visit (INDEPENDENT_AMBULATORY_CARE_PROVIDER_SITE_OTHER): Payer: Medicaid Other | Admitting: Cardiology

## 2013-04-07 VITALS — BP 172/115 | HR 79 | Ht 71.0 in | Wt 275.2 lb

## 2013-04-07 DIAGNOSIS — I359 Nonrheumatic aortic valve disorder, unspecified: Secondary | ICD-10-CM

## 2013-04-07 DIAGNOSIS — I350 Nonrheumatic aortic (valve) stenosis: Secondary | ICD-10-CM

## 2013-04-07 DIAGNOSIS — R079 Chest pain, unspecified: Secondary | ICD-10-CM

## 2013-04-07 DIAGNOSIS — I119 Hypertensive heart disease without heart failure: Secondary | ICD-10-CM

## 2013-04-07 DIAGNOSIS — I422 Other hypertrophic cardiomyopathy: Secondary | ICD-10-CM

## 2013-04-07 DIAGNOSIS — I10 Essential (primary) hypertension: Secondary | ICD-10-CM

## 2013-04-07 DIAGNOSIS — F489 Nonpsychotic mental disorder, unspecified: Secondary | ICD-10-CM

## 2013-04-07 DIAGNOSIS — F19988 Other psychoactive substance use, unspecified with other psychoactive substance-induced disorder: Secondary | ICD-10-CM

## 2013-04-07 DIAGNOSIS — F14180 Cocaine abuse with cocaine-induced anxiety disorder: Secondary | ICD-10-CM

## 2013-04-07 DIAGNOSIS — E669 Obesity, unspecified: Secondary | ICD-10-CM

## 2013-04-07 DIAGNOSIS — IMO0001 Reserved for inherently not codable concepts without codable children: Secondary | ICD-10-CM

## 2013-04-07 MED ORDER — CHLORTHALIDONE 25 MG PO TABS: 25.0000 mg | ORAL_TABLET | Freq: Every day | ORAL | Status: DC

## 2013-04-07 MED ORDER — ISOSORBIDE DINITRATE 20 MG PO TABS: 20.0000 mg | ORAL_TABLET | Freq: Two times a day (BID) | ORAL | Status: DC

## 2013-04-07 NOTE — Patient Instructions (Addendum)
Your physician has requested that you have a renal artery duplex. During this test, an ultrasound is used to evaluate blood flow to the kidneys. Allow one hour for this exam. Do not eat after midnight the day before and avoid carbonated beverages. Take your medications as you usually do.  Your physician has recommended you make the following change in your medication:  AM  Clonidine  Hydralazine Isordil (new) Lisinopril  Chlorthalidone (new)  PM  Clonidine  Hydralazine Isordil (new) Amlodipine    Your physician recommends that you schedule a follow-up appointment in:  1 month   Stop Smoking use 1-800- Quit-Now   Do not use cocaine

## 2013-04-09 ENCOUNTER — Encounter: Payer: Self-pay | Admitting: Cardiology

## 2013-04-09 NOTE — Progress Notes (Signed)
PATIENT: [name] [name]. MRN: [id number] DOB: Jan 04, 1961 PCP: Leanna Sato, MD  Clinic Note: Chief Complaint  Patient presents with  . other    NP c/o chest pain, sob, pounding of neck, elevated BP and edema ankles. Meds reviewed verbally with pt.   HPI: [name] [name]. is a 53 y.o. male with a PMH below who presents today for establishment of cardiology care. He is a pleasant gentleman with a long-standing history of hypertension, type 2 diabetes as well as polysubstance abuse (cocaine/multiple other dose), hyperlipidemia, morbid obesity as well as bipolar disorder. He is accompanied today by his "sponsor ". He is actually here in followup from hospital addition to [facility] for stroke. He was noted to be profoundly hypertensive. He was discharged sooner than expected almost AGAINST MEDICAL ADVICE. An echocardiogram was performed that demonstrated severe concentric LVH with diastolic dysfunction. This is thought be secondary to him hypertensive hypertrophic cardiomyopathy. The echocardiogram was not adequate to evaluate for a cardioembolic source, and therefore initial plans were for transesophageal echocardiogram to be done as an outpatient. The patient did not make this appointment.  Interval History: Today he really doesn't notice much in the way of symptoms. He is not sure will be too much residual neurologic function dysfunction from his recent stroke nor from previous strokes. He has some baseline weakness in left hand and foot. He really denies any significant headaches or blurred vision. He does note however occasional episodes of chest tightness that come and go. He also can feel a pounding sensation of his heart beat in his neck. He has had some lower STEMI edema. He denies any consistent exertional chest tightness or pressure, and to have some with either rest or exertion. He denies any significant near-syncope symptoms. No recurrent TIA or amaurosis fugax  symptoms and his hospitalization. No melena, hematochezia hematuria. No claudication.  Past Medical History  Diagnosis Date  . Hypertension   . Diabetes mellitus   . GERD (gastroesophageal reflux disease)   . Pericarditis   . Polysubstance abuse     cocaine, marijuana  . Auditory hallucinations   . Depression   . Hx of medication noncompliance   . History of suicide attempt   . Stroke February 2015    Right MCA aneurysm; head MRI - acute infarct of the right frontal lobe. Smaller acute infarct of the left frontal lobe. Thought to be due to cerebral emboli. Left PICA infarct. Small chronic lacunar infarct right internal capsule. Chronic microvascular ischemic change with white matter change.  . Coronary artery disease     No reported catheterization. Negative Myoview stress test and 2013.  Marland Kitchen Syncope and collapse   . Bipolar disorder   . Obesity, Class II, BMI 35-39.9, with comorbidity   . Hypertensive hypertrophic cardiomyopathy February 2015    Severe concentric LVH on echocardiogram.  . Aortic stenosis, mild February 2015    Prior Cardiac Evaluation and Past Surgical History: Past Surgical History  Procedure Laterality Date  . Pericardial fluid drainage    . Transthoracic echocardiogram  03/23/2013    EF 5560%. Elevated left atrial pressure. Severe concentric LVH. Mild aortic stenosis. -- Appearance of hypertensive hypertrophic heart disease.  No clear cardioembolic source  . Nm myoview ltd  July 2013    No ischemia or infarct. There is inferior wall defec suggestive of diaphragmatic attenuation. Mild LV dilation at to be due to hypertensive heart disease. EF 55%. Poor exercise tolerance.  . Carotid dopplers:  03/23/2013  Interval thickening of bilateral common carotid no significant plaque formation. Tortuosity but no significant stenosis. Patent bilateral vertebral  arteries    No Known Allergies  Current Outpatient Prescriptions  Medication Sig Dispense Refill  .  amLODipine (NORVASC) 10 MG tablet Take 10 mg by mouth daily.      Marland Kitchen aspirin (BAYER ASPIRIN) 325 MG tablet Take 1 tablet (325 mg total) by mouth daily.  30 tablet  1  . busPIRone (BUSPAR) 5 MG tablet Take 1 tablet (5 mg total) by mouth 2 (two) times daily.  30 tablet  0  . cloNIDine (CATAPRES) 0.2 MG tablet Take 0.2 mg by mouth 2 (two) times daily. For Hypertension      . FLUoxetine (PROZAC) 20 MG capsule Take 20 mg by mouth daily.      . hydrALAZINE (APRESOLINE) 50 MG tablet Take 1 tablet (50 mg total) by mouth 3 (three) times daily.      Marland Kitchen HYDROcodone-acetaminophen (NORCO/VICODIN) 5-325 MG per tablet Take 2 tablets by mouth every 4 (four) hours as needed for pain.  10 tablet  0  . ibuprofen (ADVIL,MOTRIN) 200 MG tablet Take 600 mg by mouth every 6 (six) hours as needed for pain.      Marland Kitchen insulin glargine (LANTUS) 100 UNIT/ML injection Inject 55 Units into the skin at bedtime. For high blood sugar control      . lisinopril (PRINIVIL,ZESTRIL) 40 MG tablet Take 40 mg by mouth daily.      . metFORMIN (GLUCOPHAGE) 500 MG tablet Take 500 mg by mouth 2 (two) times daily with a meal.      . pantoprazole (PROTONIX) 40 MG tablet Take 40 mg by mouth daily.      . traZODone (DESYREL) 50 MG tablet Take 50 mg by mouth at bedtime.      . chlorthalidone (HYGROTON) 25 MG tablet Take 1 tablet (25 mg total) by mouth daily.  90 tablet  3  . isosorbide dinitrate (ISORDIL) 20 MG tablet Take 1 tablet (20 mg total) by mouth 2 (two) times daily.  90 tablet  3   No current facility-administered medications for this visit.    History   Social History Narrative   Single with no children.  No exercise.   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   Has a history of polysubstance abuse including cocaine. No she she urine toxicology screen in the hospital was positive.  He also current smoker.   He is seen by behavioral health for bipolar disorder.    family history includes  Arthritis in his father and sister; Diabetes type II in his mother and sister; Hypertension in his father, mother, and sister; Stroke in his mother.  ROS: A comprehensive Review of Systems - Negative except Headache. Otherwise says the symptoms noted above. Minimal residual findings from her stroke there was right MCA.  PHYSICAL EXAM BP 172/115  Pulse 79  Ht 5\' 11"  (1.803 m)  Wt 275 lb 4 oz (124.853 kg)  BMI 38.41 kg/m2 General appearance: alert, cooperative, appears stated age, no distress, moderately obese and Relatively poor insight. He does answer questions appropriately, but does not seem to understand the gravity of the situation. HEENT: [place]/at she. EOMI. Neck: no adenopathy, supple, symmetrical, trachea midline and Soft bilateral bruits but nothing significant. No JVD at baseline but there is mild HJR. Lungs: clear to auscultation bilaterally, normal percussion bilaterally and This diffuse mild upper airway sounds. Nonlabored Heart: prominent apical impulse,  regular rate and rhythm, S1, S2 normal, S4 present and 2/6 crescendo decrescendo SEM at RUSB radiating to carotids. Abdomen: soft, non-tender; bowel sounds normal; no masses,  no organomegaly Extremities: No clubbing or cyanosis with 1+ lower extremity pitting edema Pulses: 2+ and symmetric Neurologic: Grossly normal. He is alert and oriented. Relatively poor insight. Mild left arm and leg weakness.  JXB:JYNWGNFAOEKG:Performed today: Yes Rate: 79 , Rhythm: NSR; rightward axis with a right atrial enlargement. Anterior-lateral T wave inversions consider ischemia. Also  Inferior T. Inversions that are suggestive of possible inferior ischemia.   Recent Labs: None currently available. Labs are reviewed on [facility] system.  ASSESSMENT / PLAN: Complex situation of a somewhat unfortunate gentleman with long-standing substance abuse, smoking, diabetes and hypertension has been poorly controlled. He has very poor insight, and I'm not sure how  much she understands was going on. He's had now at least 3 strokes but still continues to persist with his poor decisions. He is very difficult control blood pressure and is on a difficult regimen of medications. His EKG is suggestive of possible ischemia which we need to address, but his blood pressure control is more pertinent currently. In the setting of severe hypertensive cardiomyopathy, and blood pressure down as it could be the cause of his pain. Will also evaluate for renal artery stenosis.  I will see him back in roughly 1 month. Which time we'll consider evaluation with a Myoview stress test if he is still having symptoms.   Essential hypertension, malignant Medication adjustments as follows:  AM  Clonidine &Hydralazine at current dose Isordil (new) 20 mg Lisinopril at current dose Chlorthalidone (new) 25 mg  PM  Clonidine & Hydralazine at current dose Isordil (new) 20 mg Amlodipine 10 mg/current dose  He will need blood pressure check next week as an Nurse, children'sN check.  Renal Artery Dopplers.  Hypertensive hypertrophic cardiomyopathy He needed some additional afterload reduction which was added with nitrates in addition to hydralazine. Also additional blood pressure control with some diuresis in the form of chlorthalidone. I would is despite increasing that to 50 and likely adding spironolactone in the future. I would suspect that some of his chest discomfort is do to this as opposed to true coronary disease.\ Thankfully, he does not yet have signs of ventricular dilation which would suggest more end-stage disease.  Aortic stenosis, mild This is not currently in a concerning level however will need to be followed. This explains his murmur at and may also explain some of the hypertrophy. It is also sign a possible atherosclerotic disease in within the raising question if needed ischemic evaluation  Cocaine abuse with cocaine-induced anxiety disorder Absolutely detrimental to his  overall well-being. I admonished him very very vehemently in the setting of this clinic visit with his sponsor present that he cannot use cocaine anymore. This precludes our use several medications that we would prefer to use.  This also goes along with smoking cessation.  Obesity (BMI 30-39.9) We discussed dietary modification, however I'm not sure of the his level of insight.  PSYCHIATRIC DISORDER I think this may be one or most difficult issues because I'm just not sure of its insight.   Orders Placed This Encounter  Procedures  . EKG 12-Lead    Order Specific Question:  Where should this test be performed    Answer:  [facility]  . Renal Artery Duplex    Standing Status: Future     Number of Occurrences:      Standing Expiration  Date: 04/08/2014    Order Specific Question:  Where should this test be performed:    Answer:  [facility]   Meds ordered this encounter  Medications  . traZODone (DESYREL) 50 MG tablet    Sig: Take 50 mg by mouth at bedtime.  . isosorbide dinitrate (ISORDIL) 20 MG tablet    Sig: Take 1 tablet (20 mg total) by mouth 2 (two) times daily.    Dispense:  90 tablet    Refill:  3  . chlorthalidone (HYGROTON) 25 MG tablet    Sig: Take 1 tablet (25 mg total) by mouth daily.    Dispense:  90 tablet    Refill:  3    Followup: 1 month  DAVID W. Herbie [place], M.D., M.S. Interventional Cardiolgy [facility]

## 2013-04-10 DIAGNOSIS — E669 Obesity, unspecified: Secondary | ICD-10-CM | POA: Insufficient documentation

## 2013-04-10 NOTE — Assessment & Plan Note (Signed)
This is not currently in a concerning level however will need to be followed. This explains his murmur at and may also explain some of the hypertrophy. It is also sign a possible atherosclerotic disease in within the raising question if needed ischemic evaluation

## 2013-04-10 NOTE — Assessment & Plan Note (Signed)
I think this may be one or most difficult issues because I'm just not sure of its insight.

## 2013-04-10 NOTE — Assessment & Plan Note (Signed)
Absolutely detrimental to his overall well-being. I admonished him very very vehemently in the setting of this clinic visit with his sponsor present that he cannot use cocaine anymore. This precludes our use several medications that we would prefer to use.  This also goes along with smoking cessation.

## 2013-04-10 NOTE — Assessment & Plan Note (Signed)
We discussed dietary modification, however I'm not sure of the his level of insight.

## 2013-04-10 NOTE — Assessment & Plan Note (Signed)
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  Renal Artery Dopplers.

## 2013-04-10 NOTE — Assessment & Plan Note (Signed)
He needed some additional afterload reduction which was added with nitrates in addition to hydralazine. Also additional blood pressure control with some diuresis in the form of chlorthalidone. I would is despite increasing that to 50 and likely adding spironolactone in the future. I would suspect that some of his chest discomfort is do to this as opposed to true coronary disease.\ Thankfully, he does not yet have signs of ventricular dilation which would suggest more end-stage disease.

## 2013-04-23 ENCOUNTER — Encounter (INDEPENDENT_AMBULATORY_CARE_PROVIDER_SITE_OTHER): Payer: Medicaid Other

## 2013-04-23 ENCOUNTER — Telehealth: Payer: Self-pay | Admitting: *Deleted

## 2013-04-23 DIAGNOSIS — I10 Essential (primary) hypertension: Secondary | ICD-10-CM

## 2013-04-23 NOTE — Telephone Encounter (Signed)
Patient caregiver called back to say PCP is stopped the meds that are making him drowsy. Will call next week to follow up on how he is feeling. Caregiver instructed to call if SOB did not resolve or go to ED if patient becomes worse.  He verbalized understanding.

## 2013-04-23 NOTE — Telephone Encounter (Signed)
Patient was in clinic today in echo lab.  He was short of breath and lethargic.  His lung sounds were clear/diminished and O2 sats were 93% on room air. He was oriented and able to answer questions.  His sponsor who regularly brings him to his appts stated that he has been this way since he was placed on trazodone and Seroquel.  I instructed him to call his PCP right away to discuss this issue and to call us back to confirm that he got in touch with someone.  Sponsor and patient instructed to go to the ED immediately if symptoms become worse.  They verbalized understanding.    Spoke with Dr. Herbie BaltimoreHarding and he agreed with the plan to contact PCP and visit the ED should symptoms become worse.

## 2013-04-23 NOTE — Telephone Encounter (Signed)
Discussed with Ms. Carlton AdamBaucom.  The symptoms are somewhat concerning. We should be just with his primary provider.  We have asked that the patient contact his provider.  Marykay LexHARDING,Ozie Dimaria W, MD

## 2013-04-26 NOTE — Telephone Encounter (Signed)
Patients caregiver stated that his breathing and LOC have returned to normal since stopping the Seroquel.

## 2013-04-27 NOTE — Telephone Encounter (Signed)
Great to hear.  Marykay LexHARDING,Thena Devora W, MD

## 2013-05-12 ENCOUNTER — Observation Stay: Payer: Self-pay | Admitting: Internal Medicine

## 2013-05-12 ENCOUNTER — Ambulatory Visit: Payer: Medicaid Other | Admitting: Cardiology

## 2013-05-12 ENCOUNTER — Ambulatory Visit: Payer: Self-pay | Admitting: Cardiology

## 2013-05-12 ENCOUNTER — Telehealth: Payer: Self-pay

## 2013-05-12 DIAGNOSIS — R0602 Shortness of breath: Secondary | ICD-10-CM

## 2013-05-12 DIAGNOSIS — N289 Disorder of kidney and ureter, unspecified: Secondary | ICD-10-CM

## 2013-05-12 DIAGNOSIS — I5033 Acute on chronic diastolic (congestive) heart failure: Secondary | ICD-10-CM

## 2013-05-12 DIAGNOSIS — R079 Chest pain, unspecified: Secondary | ICD-10-CM

## 2013-05-12 DIAGNOSIS — I10 Essential (primary) hypertension: Secondary | ICD-10-CM

## 2013-05-12 LAB — BASIC METABOLIC PANEL
Anion Gap: 4 — ABNORMAL LOW (ref 7–16)
BUN: 19 mg/dL — ABNORMAL HIGH (ref 7–18)
CALCIUM: 8.9 mg/dL (ref 8.5–10.1)
CO2: 31 mmol/L (ref 21–32)
Chloride: 98 mmol/L (ref 98–107)
Creatinine: 1.44 mg/dL — ABNORMAL HIGH (ref 0.60–1.30)
EGFR (African American): >60
EGFR (Non-African Amer.): 55 — ABNORMAL LOW
Glucose: 368 mg/dL — ABNORMAL HIGH (ref 65–99)
OSMOLALITY: 284 (ref 275–301)
POTASSIUM: 4 mmol/L (ref 3.5–5.1)
Sodium: 133 mmol/L — ABNORMAL LOW (ref 136–145)

## 2013-05-12 LAB — TROPONIN I
Troponin-I: 0.09 ng/mL — ABNORMAL HIGH
Troponin-I: 0.06 ng/mL — ABNORMAL HIGH
Troponin-I: 0.06 ng/mL — ABNORMAL HIGH

## 2013-05-12 LAB — CK TOTAL AND CKMB (NOT AT ARMC)
CK, TOTAL: 260 U/L
CK, Total: 271 U/L
CK, Total: 241 U/L
CK-MB: 4.4 ng/mL — ABNORMAL HIGH (ref 0.5–3.6)
CK-MB: 4.5 ng/mL — ABNORMAL HIGH (ref 0.5–3.6)
CK-MB: 4.1 ng/mL — ABNORMAL HIGH (ref 0.5–3.6)

## 2013-05-12 LAB — CBC
HCT: 40.5 % (ref 40.0–52.0)
HGB: 14.3 g/dL (ref 13.0–18.0)
MCH: 31.9 pg (ref 26.0–34.0)
MCHC: 35.2 g/dL (ref 32.0–36.0)
MCV: 90 fL (ref 80–100)
Platelet: 218 10*3/uL (ref 150–440)
RBC: 4.49 10*6/uL (ref 4.40–5.90)
RDW: 12.6 % (ref 11.5–14.5)
WBC: 9.2 10*3/uL (ref 3.8–10.6)

## 2013-05-12 LAB — DRUG SCREEN, URINE
Amphetamines, Ur Screen: NEGATIVE (ref ?–1000)
BARBITURATES, UR SCREEN: NEGATIVE (ref ?–200)
BENZODIAZEPINE, UR SCRN: NEGATIVE (ref ?–200)
Cannabinoid 50 Ng, Ur ~~LOC~~: NEGATIVE (ref ?–50)
Cocaine Metabolite,Ur ~~LOC~~: NEGATIVE (ref ?–300)
MDMA (Ecstasy)Ur Screen: POSITIVE (ref ?–500)
Methadone, Ur Screen: NEGATIVE (ref ?–300)
Opiate, Ur Screen: NEGATIVE (ref ?–300)
Phencyclidine (PCP) Ur S: NEGATIVE (ref ?–25)
Tricyclic, Ur Screen: NEGATIVE (ref ?–1000)

## 2013-05-12 LAB — PRO B NATRIURETIC PEPTIDE: B-Type Natriuretic Peptide: 555 pg/mL — ABNORMAL HIGH (ref 0–125)

## 2013-05-12 NOTE — Telephone Encounter (Signed)
Dr. Herbie BaltimoreHarding is aware.

## 2013-05-12 NOTE — Telephone Encounter (Signed)
Pt caretake called and states pt is in [facility] for SOB/[facility]. Wanted Dr. Herbie BaltimoreHarding to know. Pt had an appt this afternoon, which I cancelled.

## 2013-05-13 ENCOUNTER — Telehealth: Payer: Self-pay

## 2013-05-13 DIAGNOSIS — I2510 Atherosclerotic heart disease of native coronary artery without angina pectoris: Secondary | ICD-10-CM

## 2013-05-13 LAB — LIPID PANEL
CHOLESTEROL: 128 mg/dL (ref 0–200)
HDL: 19 mg/dL — AB (ref 40–60)
LDL CHOLESTEROL, CALC: 74 mg/dL (ref 0–100)
TRIGLYCERIDES: 175 mg/dL (ref 0–200)
VLDL Cholesterol, Calc: 35 mg/dL (ref 5–40)

## 2013-05-13 LAB — CBC WITH DIFFERENTIAL/PLATELET
BASOS ABS: 0 10*3/uL (ref 0.0–0.1)
Basophil %: 0.5 %
EOS PCT: 2.3 %
Eosinophil #: 0.2 10*3/uL (ref 0.0–0.7)
HCT: 40.7 % (ref 40.0–52.0)
HGB: 14.2 g/dL (ref 13.0–18.0)
Lymphocyte #: 2.2 10*3/uL (ref 1.0–3.6)
Lymphocyte %: 23.8 %
MCH: 31.6 pg (ref 26.0–34.0)
MCHC: 34.9 g/dL (ref 32.0–36.0)
MCV: 91 fL (ref 80–100)
MONOS PCT: 9 %
Monocyte #: 0.8 x10 3/mm (ref 0.2–1.0)
Neutrophil #: 6 10*3/uL (ref 1.4–6.5)
Neutrophil %: 64.4 %
PLATELETS: 214 10*3/uL (ref 150–440)
RBC: 4.49 10*6/uL (ref 4.40–5.90)
RDW: 12.8 % (ref 11.5–14.5)
WBC: 9.4 10*3/uL (ref 3.8–10.6)

## 2013-05-13 LAB — PROTIME-INR
INR: 1
PROTHROMBIN TIME: 13.4 s (ref 11.5–14.7)

## 2013-05-13 LAB — BASIC METABOLIC PANEL
ANION GAP: 5 — AB (ref 7–16)
BUN: 19 mg/dL — AB (ref 7–18)
Calcium, Total: 9 mg/dL (ref 8.5–10.1)
Chloride: 97 mmol/L — ABNORMAL LOW (ref 98–107)
Co2: 30 mmol/L (ref 21–32)
Creatinine: 1.42 mg/dL — ABNORMAL HIGH (ref 0.60–1.30)
EGFR (African American): >60
GFR CALC NON AF AMER: 56 — AB
GLUCOSE: 279 mg/dL — AB (ref 65–99)
Osmolality: 277 (ref 275–301)
Potassium: 3.5 mmol/L (ref 3.5–5.1)
Sodium: 132 mmol/L — ABNORMAL LOW (ref 136–145)

## 2013-05-13 LAB — MAGNESIUM: MAGNESIUM: 1.7 mg/dL — AB

## 2013-05-13 NOTE — Telephone Encounter (Signed)
Attempted to contact pt regarding discharge from [facility] on 05/13/13. No answer/no machine at home number. Mobile number disconnected.

## 2013-05-20 ENCOUNTER — Encounter: Payer: Self-pay | Admitting: Cardiovascular Disease

## 2013-05-20 ENCOUNTER — Ambulatory Visit (INDEPENDENT_AMBULATORY_CARE_PROVIDER_SITE_OTHER): Payer: Medicaid Other | Admitting: Cardiovascular Disease

## 2013-05-20 VITALS — BP 148/96 | HR 92 | Ht 71.0 in | Wt 264.0 lb

## 2013-05-20 DIAGNOSIS — IMO0001 Reserved for inherently not codable concepts without codable children: Secondary | ICD-10-CM

## 2013-05-20 DIAGNOSIS — F1910 Other psychoactive substance abuse, uncomplicated: Secondary | ICD-10-CM

## 2013-05-20 DIAGNOSIS — IMO0002 Reserved for concepts with insufficient information to code with codable children: Secondary | ICD-10-CM

## 2013-05-20 DIAGNOSIS — R0602 Shortness of breath: Secondary | ICD-10-CM

## 2013-05-20 DIAGNOSIS — I517 Cardiomegaly: Secondary | ICD-10-CM | POA: Insufficient documentation

## 2013-05-20 DIAGNOSIS — I10 Essential (primary) hypertension: Secondary | ICD-10-CM

## 2013-05-20 DIAGNOSIS — I639 Cerebral infarction, unspecified: Secondary | ICD-10-CM

## 2013-05-20 DIAGNOSIS — I6350 Cerebral infarction due to unspecified occlusion or stenosis of unspecified cerebral artery: Secondary | ICD-10-CM

## 2013-05-20 DIAGNOSIS — R079 Chest pain, unspecified: Secondary | ICD-10-CM

## 2013-05-20 DIAGNOSIS — E1165 Type 2 diabetes mellitus with hyperglycemia: Secondary | ICD-10-CM

## 2013-05-20 NOTE — Progress Notes (Signed)
Patient ID: [name] LimLinnies Molnar Jr., male    DOB: 07/16/1960, 53 y.o.   MRN: [id number]  HPI Comments: 53 year old male with history of hypertension, diabetes, CVAs, polysubstance abuse with cocaine, bipolar disorder, hyperlipidemia, obesity, admitted to the hospital 05/12/2013 with shortness of breath and chest pain. Recent stroke with admission to the hospital February 2015 in the setting of severe hypertension  Echocardiogram showed normal ejection fraction with severe concentric LVH, diastolic dysfunction, unable to exclude ASD/PFO  Prior stroke in 03/24/2013 was an acute right frontal MCA lesion, embolic versus atherosclerotic TEE was recommended but the patient refused Drug test positive for cocaine and MDMA  He had a recent cardiac catheterization last week which showed no significant CAD. This was done for chronic chest pain. Hemoglobin A1c of 10 in [place] 2015 Creatinine 1.4  In followup today, he reports that he takes all his medications in the morning and then feels very tired. Friend presents with him today and reports that he would confirm that the patient is washed out during the daytime. Otherwise he has no complaints  EKG shows normal sinus rhythm with rate 92 beats per minute, nonspecific T wave abnormality in the anterolateral leads    Outpatient Encounter Prescriptions as of 05/20/2013  Medication Sig  . amLODipine (NORVASC) 10 MG tablet Take 10 mg by mouth daily.  Marland Kitchen. aspirin (BAYER ASPIRIN) 81 MG tablet Take 2 tablets (162 mg total) by mouth daily.  Marland Kitchen. buPROPion (WELLBUTRIN XL) 300 MG 24 hr tablet Take 300 mg by mouth every morning.  . busPIRone (BUSPAR) 5 MG tablet Take 1 tablet (5 mg total) by mouth 2 (two) times daily.  . chlorthalidone (HYGROTON) 25 MG tablet Take 1 tablet (25 mg total) by mouth daily.  . cloNIDine (CATAPRES) 0.2 MG tablet Take 0.2 mg by mouth 2 (two) times daily. For Hypertension  . clopidogrel (PLAVIX) 75 MG tablet Take 75 mg by mouth once.  . insulin  glargine (LANTUS) 100 UNIT/ML injection Inject 55 Units into the skin at bedtime. For high blood sugar control  . isosorbide mononitrate (IMDUR) 30 MG 24 hr tablet Take 90 mg by mouth daily.  Marland Kitchen. lisinopril (PRINIVIL,ZESTRIL) 40 MG tablet Take 40 mg by mouth daily.  . pantoprazole (PROTONIX) 40 MG tablet Take 40 mg by mouth daily.  . traZODone (DESYREL) 150 MG tablet Take 150 mg by mouth at bedtime.  . [DISCONTINUED] aspirin (BAYER ASPIRIN) 325 MG tablet Take 1 tablet (325 mg total) by mouth daily.  . [DISCONTINUED] FLUoxetine (PROZAC) 20 MG capsule Take 20 mg by mouth daily.  . [DISCONTINUED] hydrALAZINE (APRESOLINE) 50 MG tablet Take 1 tablet (50 mg total) by mouth 3 (three) times daily.  . [DISCONTINUED] HYDROcodone-acetaminophen (NORCO/VICODIN) 5-325 MG per tablet Take 2 tablets by mouth every 4 (four) hours as needed for pain.  . [DISCONTINUED] ibuprofen (ADVIL,MOTRIN) 200 MG tablet Take 600 mg by mouth every 6 (six) hours as needed for pain.  . [DISCONTINUED] isosorbide dinitrate (ISORDIL) 20 MG tablet Take 1 tablet (20 mg total) by mouth 2 (two) times daily.  . [DISCONTINUED] metFORMIN (GLUCOPHAGE) 500 MG tablet Take 500 mg by mouth 2 (two) times daily with a meal.  . [DISCONTINUED] traZODone (DESYREL) 50 MG tablet Take 50 mg by mouth at bedtime.     Review of Systems  Constitutional: Positive for fatigue.  HENT: Negative.   Eyes: Negative.   Respiratory: Negative.   Cardiovascular: Negative.   Gastrointestinal: Negative.   Endocrine: Negative.   Musculoskeletal: Negative.   Skin:  Negative.   Allergic/Immunologic: Negative.   Neurological: Negative.   Hematological: Negative.   Psychiatric/Behavioral: Negative.   All other systems reviewed and are negative.   BP 148/96  Pulse 92  Ht 5\' 11"  (1.803 m)  Wt 264 lb (119.75 kg)  BMI 36.84 kg/m2  Physical Exam  Nursing note and vitals reviewed. Constitutional: He is oriented to person, place, and time. He appears well-developed  and well-nourished.  HENT:  Head: Normocephalic.  Nose: Nose normal.  Mouth/Throat: Oropharynx is clear and moist.  Eyes: Conjunctivae are normal. Pupils are equal, round, and reactive to light.  Neck: Normal range of motion. Neck supple. No JVD present.  Cardiovascular: Normal rate, regular rhythm, S1 normal, S2 normal, normal heart sounds and intact distal pulses.  Exam reveals no gallop and no friction rub.   No murmur heard. Pulmonary/Chest: Effort normal and breath sounds normal. No respiratory distress. He has no wheezes. He has no rales. He exhibits no tenderness.  Abdominal: Soft. Bowel sounds are normal. He exhibits no distension. There is no tenderness.  Musculoskeletal: Normal range of motion. He exhibits no edema and no tenderness.  Lymphadenopathy:    He has no cervical adenopathy.  Neurological: He is alert and oriented to person, place, and time. Coordination normal.  Skin: Skin is warm and dry. No rash noted. No erythema.  Psychiatric: He has a normal mood and affect. His behavior is normal. Judgment and thought content normal.      Assessment and Plan

## 2013-05-20 NOTE — Patient Instructions (Signed)
You are doing well. No medication changes were made.  Please call us if you have new issues that need to be addressed before your next appt.  Your physician wants you to follow-up in: 6 months.  You will receive a reminder letter in the mail two months in advance. If you don't receive a letter, please call our office to schedule the follow-up appointment.   

## 2013-05-20 NOTE — Assessment & Plan Note (Signed)
Recommended he refrain from using cocaine given the possible risk of heart attack.

## 2013-05-20 NOTE — Assessment & Plan Note (Signed)
We have gone through all of his medications with him again and suggested he take several of the medications in the evening as he has significant fatigue in the daytime. Recommended if he continues to have severe fatigue symptoms in the day after taking his medications, that he call he office. Symptoms could be coming from clonidine.

## 2013-05-20 NOTE — Assessment & Plan Note (Signed)
We have encouraged continued exercise, careful diet management in an effort to lose weight. 

## 2013-05-20 NOTE — Assessment & Plan Note (Addendum)
Prior strokes likely secondary to severe hypertension. No significant cad seen on recent catheterization also with normal ejection fraction.

## 2013-05-20 NOTE — Assessment & Plan Note (Signed)
Severe LVH seen on echocardiogram. Will need to continue aggressive blood pressure management

## 2013-05-30 ENCOUNTER — Emergency Department: Payer: Self-pay

## 2013-05-30 LAB — CBC WITH DIFFERENTIAL/PLATELET
Basophil #: 0.3 10*3/uL — ABNORMAL HIGH (ref 0.0–0.1)
Basophil %: 2.4 %
EOS PCT: 2.3 %
Eosinophil #: 0.3 10*3/uL (ref 0.0–0.7)
HCT: 45.9 % (ref 40.0–52.0)
HGB: 15.8 g/dL (ref 13.0–18.0)
Lymphocyte #: 2.4 10*3/uL (ref 1.0–3.6)
Lymphocyte %: 19.9 %
MCH: 31 pg (ref 26.0–34.0)
MCHC: 34.3 g/dL (ref 32.0–36.0)
MCV: 90 fL (ref 80–100)
Monocyte #: 1 x10 3/mm (ref 0.2–1.0)
Monocyte %: 7.8 %
NEUTROS ABS: 8.3 10*3/uL — AB (ref 1.4–6.5)
NEUTROS PCT: 67.6 %
Platelet: 253 10*3/uL (ref 150–440)
RBC: 5.09 10*6/uL (ref 4.40–5.90)
RDW: 12.5 % (ref 11.5–14.5)
WBC: 12.3 10*3/uL — ABNORMAL HIGH (ref 3.8–10.6)

## 2013-05-30 LAB — URINALYSIS, COMPLETE
BILIRUBIN, UR: NEGATIVE
BLOOD: NEGATIVE
Bacteria: NONE SEEN
Glucose,UR: >=500 mg/dL (ref 0–75)
KETONE: NEGATIVE
LEUKOCYTE ESTERASE: NEGATIVE
NITRITE: NEGATIVE
Ph: 5 (ref 4.5–8.0)
Protein: NEGATIVE
RBC,UR: NONE SEEN /HPF (ref 0–5)
Specific Gravity: 1.022 (ref 1.003–1.030)
WBC UR: NONE SEEN /HPF (ref 0–5)

## 2013-05-30 LAB — COMPREHENSIVE METABOLIC PANEL
ALK PHOS: 72 U/L
ANION GAP: 5 — AB (ref 7–16)
Albumin: 3.5 g/dL (ref 3.4–5.0)
BILIRUBIN TOTAL: 0.3 mg/dL (ref 0.2–1.0)
BUN: 24 mg/dL — AB (ref 7–18)
CALCIUM: 9 mg/dL (ref 8.5–10.1)
Chloride: 100 mmol/L (ref 98–107)
Co2: 30 mmol/L (ref 21–32)
Creatinine: 1.35 mg/dL — ABNORMAL HIGH (ref 0.60–1.30)
EGFR (Non-African Amer.): 60 — ABNORMAL LOW
GLUCOSE: 326 mg/dL — AB (ref 65–99)
OSMOLALITY: 287 (ref 275–301)
POTASSIUM: 4.2 mmol/L (ref 3.5–5.1)
SGOT(AST): 25 U/L (ref 15–37)
SGPT (ALT): 23 U/L (ref 12–78)
SODIUM: 135 mmol/L — AB (ref 136–145)
TOTAL PROTEIN: 7.2 g/dL (ref 6.4–8.2)

## 2013-05-31 ENCOUNTER — Telehealth: Payer: Self-pay | Admitting: *Deleted

## 2013-05-31 NOTE — Telephone Encounter (Signed)
Left med list at front desk for pt to p/u w/ instructions for pt to take amlodipine in pm, clonidine in am/pm, imdur in am, and lisinopril in pm.

## 2013-05-31 NOTE — Telephone Encounter (Signed)
Patients caregiver called and stated that at his last visit Dr. Mariah MillingGollan had hand written some special medication instructions on his AVS  Patient has misplaced the AVS  Patients caregiver asked that we make a copy and leave it at the front desk  Please call when it is ready for pick up

## 2013-06-21 ENCOUNTER — Telehealth: Payer: Self-pay

## 2013-06-21 ENCOUNTER — Emergency Department: Payer: Self-pay | Admitting: Emergency Medicine

## 2013-06-21 NOTE — Telephone Encounter (Signed)
Faxed med list to pt [phone]

## 2013-09-10 ENCOUNTER — Emergency Department: Payer: Self-pay | Admitting: Emergency Medicine

## 2013-09-10 LAB — COMPREHENSIVE METABOLIC PANEL
ALT: 26 U/L
Albumin: 3.4 g/dL (ref 3.4–5.0)
Alkaline Phosphatase: 86 U/L
Anion Gap: 7 (ref 7–16)
BILIRUBIN TOTAL: 0.4 mg/dL (ref 0.2–1.0)
BUN: 13 mg/dL (ref 7–18)
Calcium, Total: 8.7 mg/dL (ref 8.5–10.1)
Chloride: 99 mmol/L (ref 98–107)
Co2: 29 mmol/L (ref 21–32)
Creatinine: 1.42 mg/dL — ABNORMAL HIGH (ref 0.60–1.30)
EGFR (African American): >60
EGFR (Non-African Amer.): 56 — ABNORMAL LOW
GLUCOSE: 233 mg/dL — AB (ref 65–99)
Osmolality: 278 (ref 275–301)
Potassium: 4 mmol/L (ref 3.5–5.1)
SGOT(AST): 30 U/L (ref 15–37)
SODIUM: 135 mmol/L — AB (ref 136–145)
TOTAL PROTEIN: 7.6 g/dL (ref 6.4–8.2)

## 2013-09-10 LAB — URINALYSIS, COMPLETE
BACTERIA: NONE SEEN
Bilirubin,UR: NEGATIVE
Blood: NEGATIVE
Glucose,UR: >=500 mg/dL (ref 0–75)
Hyaline Cast: 6
KETONE: NEGATIVE
Leukocyte Esterase: NEGATIVE
Nitrite: NEGATIVE
Ph: 5 (ref 4.5–8.0)
Protein: 30
RBC,UR: NONE SEEN /HPF (ref 0–5)
Specific Gravity: 1.024 (ref 1.003–1.030)
Squamous Epithelial: NONE SEEN
WBC UR: NONE SEEN /HPF (ref 0–5)

## 2013-09-10 LAB — CBC WITH DIFFERENTIAL/PLATELET
Basophil #: 0.1 10*3/uL (ref 0.0–0.1)
Basophil %: 0.8 %
EOS PCT: 3.2 %
Eosinophil #: 0.4 10*3/uL (ref 0.0–0.7)
HCT: 46.5 % (ref 40.0–52.0)
HGB: 15.8 g/dL (ref 13.0–18.0)
Lymphocyte #: 2.4 10*3/uL (ref 1.0–3.6)
Lymphocyte %: 21.2 %
MCH: 31.2 pg (ref 26.0–34.0)
MCHC: 34 g/dL (ref 32.0–36.0)
MCV: 92 fL (ref 80–100)
MONO ABS: 0.8 x10 3/mm (ref 0.2–1.0)
MONOS PCT: 6.9 %
NEUTROS ABS: 7.7 10*3/uL — AB (ref 1.4–6.5)
NEUTROS PCT: 67.9 %
PLATELETS: 246 10*3/uL (ref 150–440)
RBC: 5.06 10*6/uL (ref 4.40–5.90)
RDW: 12.6 % (ref 11.5–14.5)
WBC: 11.4 10*3/uL — AB (ref 3.8–10.6)

## 2013-09-10 LAB — TROPONIN I: Troponin-I: 0.04 ng/mL

## 2013-09-21 ENCOUNTER — Emergency Department: Payer: Self-pay | Admitting: Emergency Medicine

## 2013-09-21 LAB — URINALYSIS, COMPLETE
BILIRUBIN, UR: NEGATIVE
BLOOD: NEGATIVE
Bacteria: NONE SEEN
Glucose,UR: 50 mg/dL (ref 0–75)
KETONE: NEGATIVE
LEUKOCYTE ESTERASE: NEGATIVE
NITRITE: NEGATIVE
PH: 5 (ref 4.5–8.0)
PROTEIN: NEGATIVE
RBC,UR: 1 /HPF (ref 0–5)
Specific Gravity: 1.025 (ref 1.003–1.030)
Squamous Epithelial: <1
WBC UR: 1 /HPF (ref 0–5)

## 2013-09-21 LAB — COMPREHENSIVE METABOLIC PANEL
ALBUMIN: 3.6 g/dL (ref 3.4–5.0)
ALK PHOS: 71 U/L
AST: 11 U/L — AB (ref 15–37)
Anion Gap: 7 (ref 7–16)
BUN: 16 mg/dL (ref 7–18)
Bilirubin,Total: 0.4 mg/dL (ref 0.2–1.0)
CHLORIDE: 104 mmol/L (ref 98–107)
CREATININE: 1.26 mg/dL (ref 0.60–1.30)
Calcium, Total: 8.8 mg/dL (ref 8.5–10.1)
Co2: 25 mmol/L (ref 21–32)
EGFR (African American): >60
EGFR (Non-African Amer.): >60
GLUCOSE: 189 mg/dL — AB (ref 65–99)
Osmolality: 278 (ref 275–301)
POTASSIUM: 3.7 mmol/L (ref 3.5–5.1)
SGPT (ALT): 18 U/L
SODIUM: 136 mmol/L (ref 136–145)
Total Protein: 7 g/dL (ref 6.4–8.2)

## 2013-09-21 LAB — CBC
HCT: 42.9 % (ref 40.0–52.0)
HGB: 14.3 g/dL (ref 13.0–18.0)
MCH: 30.6 pg (ref 26.0–34.0)
MCHC: 33.2 g/dL (ref 32.0–36.0)
MCV: 92 fL (ref 80–100)
Platelet: 223 10*3/uL (ref 150–440)
RBC: 4.65 10*6/uL (ref 4.40–5.90)
RDW: 12.7 % (ref 11.5–14.5)
WBC: 11.7 10*3/uL — ABNORMAL HIGH (ref 3.8–10.6)

## 2013-09-21 LAB — MAGNESIUM: MAGNESIUM: 1.7 mg/dL — AB

## 2013-09-21 LAB — TROPONIN I: TROPONIN-I: 0.06 ng/mL — AB

## 2013-09-21 LAB — LIPASE, BLOOD: LIPASE: 140 U/L (ref 73–393)

## 2013-09-22 LAB — TROPONIN I: TROPONIN-I: 0.06 ng/mL — AB

## 2013-11-26 ENCOUNTER — Emergency Department: Payer: Self-pay | Admitting: Emergency Medicine

## 2013-11-26 LAB — COMPREHENSIVE METABOLIC PANEL
ALK PHOS: 60 U/L
ALT: 20 U/L
Albumin: 3.5 g/dL (ref 3.4–5.0)
Anion Gap: 6 — ABNORMAL LOW (ref 7–16)
BILIRUBIN TOTAL: 0.4 mg/dL (ref 0.2–1.0)
BUN: 17 mg/dL (ref 7–18)
Calcium, Total: 8.4 mg/dL — ABNORMAL LOW (ref 8.5–10.1)
Chloride: 106 mmol/L (ref 98–107)
Co2: 25 mmol/L (ref 21–32)
Creatinine: 1.16 mg/dL (ref 0.60–1.30)
GLUCOSE: 118 mg/dL — AB (ref 65–99)
Osmolality: 276 (ref 275–301)
POTASSIUM: 3.9 mmol/L (ref 3.5–5.1)
SGOT(AST): 14 U/L — ABNORMAL LOW (ref 15–37)
Sodium: 137 mmol/L (ref 136–145)
TOTAL PROTEIN: 6.8 g/dL (ref 6.4–8.2)

## 2013-11-26 LAB — DRUG SCREEN, URINE
Amphetamines, Ur Screen: NEGATIVE (ref ?–1000)
BARBITURATES, UR SCREEN: NEGATIVE (ref ?–200)
BENZODIAZEPINE, UR SCRN: NEGATIVE (ref ?–200)
COCAINE METABOLITE, UR ~~LOC~~: NEGATIVE (ref ?–300)
Cannabinoid 50 Ng, Ur ~~LOC~~: NEGATIVE (ref ?–50)
MDMA (ECSTASY) UR SCREEN: POSITIVE (ref ?–500)
METHADONE, UR SCREEN: NEGATIVE (ref ?–300)
Opiate, Ur Screen: NEGATIVE (ref ?–300)
Phencyclidine (PCP) Ur S: NEGATIVE (ref ?–25)
Tricyclic, Ur Screen: NEGATIVE (ref ?–1000)

## 2013-11-26 LAB — CBC
HCT: 43 % (ref 40.0–52.0)
HGB: 14.4 g/dL (ref 13.0–18.0)
MCH: 31.1 pg (ref 26.0–34.0)
MCHC: 33.6 g/dL (ref 32.0–36.0)
MCV: 93 fL (ref 80–100)
Platelet: 200 10*3/uL (ref 150–440)
RBC: 4.64 10*6/uL (ref 4.40–5.90)
RDW: 12.8 % (ref 11.5–14.5)
WBC: 9.1 10*3/uL (ref 3.8–10.6)

## 2013-11-26 LAB — URINALYSIS, COMPLETE
Bacteria: NONE SEEN
Bilirubin,UR: NEGATIVE
Blood: NEGATIVE
Glucose,UR: NEGATIVE mg/dL (ref 0–75)
Ketone: NEGATIVE
LEUKOCYTE ESTERASE: NEGATIVE
Nitrite: NEGATIVE
PH: 5 (ref 4.5–8.0)
Protein: NEGATIVE
Specific Gravity: 1.016 (ref 1.003–1.030)
Squamous Epithelial: NONE SEEN

## 2013-11-26 LAB — SALICYLATE LEVEL: SALICYLATES, SERUM: 3.2 mg/dL — AB

## 2013-11-26 LAB — ETHANOL: Ethanol: <3 mg/dL (ref 0–80)

## 2013-11-26 LAB — ACETAMINOPHEN LEVEL: Acetaminophen: <2 ug/mL

## 2014-04-21 ENCOUNTER — Emergency Department: Payer: Self-pay | Admitting: Emergency Medicine

## 2014-05-24 NOTE — Discharge Summary (Signed)
PATIENT NAME:  [name] MeigsRIDE, [name] [name] MR#:  [id number] DATE OF BIRTH:  Aug 29, 1960  DATE OF ADMISSION:  08/31/2011 DATE OF DISCHARGE:  09/02/2011  DISCHARGE DIAGNOSES:  1. Uncontrolled hypertension.  2. Cocaine abuse. 3. Uncontrolled diabetes mellitus.  4. Depression.  5. Polysubstance abuse.  6. Tobacco abuse. 7. noncompliance.  8. Obesity.   IMAGING STUDIES: Imaging studies done include Myoview nuclear scan which showed 55% ejection fraction with mildly dilated left ventricle due to hypertensive heart disease. Poor exercise tolerance and no evidence of ischemia or infarct other than a fixed inferior wall defect likely to the diaphragmatic attenuation.   ADMITTING HISTORY AND PHYSICAL: Please see detailed History and Physical dictated on 08/31/2011. In brief, a 54 year old African American male patient with history of untreated diabetes, hypertension, depression, and polysubstance abuse presenting to the Emergency Room complaining of chest pain. The patient said he felt like a muscle pull. He was also found to have uncontrolled  hypertension, uncontrolled diabetes, and [facility].   HOSPITAL COURSE:  1. Chest pain: The patient had a Myoview stress test done which showed 55% ejection fraction with a fixed defect in the inferior wall, no reversible ischemia. The patient had his chest pain resolved during the hospital stay. Cardiac enzymes were normal. EKG with no acute changes. He is being discharged home in a stable condition to follow up with his primary care physician.  2. Uncontrolled hypertension: The patient has had history of noncompliance and his uncontrolled hypertension was thought to be secondary to cocaine abuse. The patient is doing well after being started on Vasotec and metoprolol.  3. Uncontrolled diabetes: The patient has been started on Lantus during the hospital stay which he will continue. He has also been given a prescription for a  glucometer Accu-strips to check his blood sugars 4 times a day before meals and at bedtime.  4. Cocaine abuse along with tobacco abuse: The patient was counseled for more than three minutes during the hospital stay to quit smoking. He was also extensively counseled to stop using cocaine now that he will be on a beta blocker I have told him personally that it will be dangerous using cocaine being on a beta blocker. The patient verbalized understanding and has promised not to use cocaine in the future.   On the day of discharge, the patient is doing well. He had his stress test which was negative. Blood pressure is slowly trending down. It is 151/91 with being afebrile. Cardiovascular examination is normal, and he is being discharged home in a stable condition.   DISCHARGE MEDICATIONS:  1. Vasotec 10 mg oral once a day.  2. Aspirin 81 mg oral once a day.  3. Nitroglycerin 0.4 mg sublingual as needed for chest pain.  4. Insulin glargine 20 units subcutaneous at bedtime once a day.  5. Metoprolol tartrate 50 mg oral every 12 hours.   DISCHARGE INSTRUCTIONS:  1. Follow up with primary care physician in a week.  2. The patient will be on a diabetic/cardiac diet.  3. Activity as tolerated.   This plan was discussed with the patient, who has verbalized understanding and is okay with the plan.   TIME SPENT: Time spent today on discharge dictation, along with coordinating care and counseling of the patient was 35 minutes.  ____________________________ Molinda BailiffSrikar R. Shaton Lore, MD srs:cbb D: 09/02/2011 15:58:47 ET T: 09/03/2011 14:01:41 ET JOB#: [id number]  cc: Wardell HeathSrikar R. Elpidio AnisSudini, MD, <Dictator> Leanna SatoLinda M. Miles, MD Wardell HeathSRIKAR [place] Maximilien Hayashi  MD ELECTRONICALLY SIGNED 09/04/2011 14:02

## 2014-05-24 NOTE — H&P (Signed)
PATIENT NAME:  [name] [name], [name] [name]#:  [id number] DATE OF BIRTH:  November 05, 1960  DATE OF ADMISSION:  08/31/2011  CHIEF COMPLAINT: Chest pain.   HISTORY OF PRESENT ILLNESS: [name]. [name] [name] is a 54 year old white male with a history of untreated diabetes, hypertension, and depression with history of polysubstance abuse who is being admitted today for recurrent chest pain which started 2 to 3 weeks ago and was occurring intermittently at rest. On the day of admission the chest pain began this morning and did not abate until he was treated in the Emergency Room. He describes the chest pain as starting in the center of his chest and then radiating to his left shoulder and behind the scapula. He says it feels like a muscle pull, however, the over-the-counter medications for all day pain that he had been taking had not touched it. He has noted some diaphoresis and nausea with it today. He has untreated hypertension and hyperlipidemia and has not been on medications for at least a year. He denies any recent polysubstance abuse and states that he only uses tobacco on a daily basis and has not had alcohol or cocaine in several years. Urine drug screen is pending. He lives with his father out in [place]. He has been unable to get transportation to see his previously prescribed doctor, Dr. Darreld McleanLinda [name], at [facility]. He thinks his last visit with her was 1 to 2 years ago.   PRIMARY CARE DOCTOR: Dr[facility] Darre[facility]Linda [name] at [facility]. He has not been seen there in over a year.   PAST MEDICAL HISTORY:  1. History of psychotic depression with history of suicidality and frequent admissions.  2. History of polysubstance abuse with alcohol, tobacco, and cocaine all in the past. Currently using tobacco.  3. Diabetes mellitus, previously on insulin. Has not been treated in years.  4. Hypertension, not treated.  5. Alcohol abuse having quit in 2004 per patient.   MEDICATIONS: He takes no medications on a regular basis.   PAST  SURGICAL HISTORY: None.   ALLERGIES: He has no known allergies.   FAMILY HISTORY: Notable for hypertension, coronary artery disease, and alcoholism. His mother is deceased. His father is alive. Sister has a history of depression.   SOCIAL HISTORY: He smokes about a pack a day since he was a young man. No current alcohol or illicit drug use per patient. Lives with [place] father out in [place]. 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   REVIEW OF SYSTEMS: Positive for unintentional weight loss of about 5 pounds. He has been having blurred vision for the last several weeks. His last eye exam was about six years ago. 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 No recent changes in hearing. No difficulty swallowing. He is missing several teeth. No painful respirations, cough, wheezing, or hemoptysis. Recurrent intermittent chest pain for the last three weeks. No dyspnea with exertion. No history of palpitations or syncope. No nausea, vomiting, diarrhea, or abdominal pain. He has noted some constipation. He has noted frequency with 7 to 8 voids per night unless he reduces his water consumption and then it is reduced to 4 times per night. He denies any history of anemia, easy bruising or bleeding. He has chronic back and ankle pain and is endorsing foot pain as well. He has developed numbness in both legs over the last four months. He has no history of strokes or TIAs. He does have a history of anxiety, insomnia,  and depression, is currently not being treated. He has not seen a psychiatrist since his last hospitalization.   PHYSICAL EXAMINATION:   GENERAL: This is an obese middle-aged male in no apparent distress.   VITAL SIGNS: Blood pressure 181/107, pulse 87, rhythm is regular, temperature 97. He is sating 95% on room air. At initial ER evaluation his pain was 8 out of 10. He is endorsing hunger.   HEENT: Pupils are equal, round, and reactive to light. Extraocular movements are  intact. Sclerae are nonicteric but his eyes are bloodshot. He has poor dentition and is missing several teeth.   NECK: Supple without lymphadenopathy, JVD, thyromegaly, or carotid bruits.   LUNGS: Clear to auscultation bilaterally with no wheezing or rhonchi.   CARDIOVASCULAR: Regular rate and rhythm. No murmurs, rubs, or gallops.   ABDOMEN: Obese, nontender, and nondistended with good bowel sounds and no evidence of hepatosplenomegaly. He has 5/5 strength in all four extremities.   SKIN: Warm and dry without rashes or lesions.   LYMPH: There is no supraclavicular, axillary, inguinal, or cervical lymphadenopathy.   NEUROLOGICAL: Grossly nonfocal. He is alert and oriented to person, place, and time. He is somewhat anxious.   ADMISSION LABORATORY DATA: Sodium 136, potassium 3.7, chloride 103, bicarb 23, BUN 19, creatinine 1.3, glucose 418, white count 9.5, hemoglobin 14.4, platelets 215. CK is elevated at 334. MB is normal at 2.8. Troponin-I is 0.05.   12-lead EKG shows normal sinus rhythm with T wave inversions in the lateral leads and prolonged QT.   Portable chest x-ray shows mildly increased interstitial markings in the perihilar regions.   ASSESSMENT AND PLAN:  1. Chest pain concerning for unstable angina given his risk factors of tobacco abuse, uncontrolled diabetes, and uncontrolled hypertension. He has been given a dose of Lovenox in the ER as well as aspirin. Will admit to telemetry bed. Urine drug screen is pending but will start metoprolol at 50 mg b.i.d. as he endorses no cocaine use in the last several years. Nitroglycerin sublingual p.r.n. as he is currently pain free.  2. Uncontrolled diabetes. The patient was previously on insulin. We will hold metformin in the event that he is going to have a stress test if he is here until Monday. Lantus and sliding scale have been ordered. Hemoglobin A1c is pending.  3. Uncontrolled hypertension. Will start metoprolol and add an ACE  inhibitor.  4. History of depression, previously on Zoloft and trazodone after last hospital admission. He has not been on any medications in quite some time. Will start Ambien for insomnia and will start citalopram for anxiety.  5. Polysubstance abuse counseling has been given. NicoDerm patch has been ordered. Urine drug screen is pending.       ESTIMATED TIME OF CARE: 45 minutes.   ____________________________ Duncan Dull, MD tt:drc D: 08/31/2011 17:58:04 ET T: 09/01/2011 07:56:55 ET JOB#: [id number]  cc: Duncan Dull, MD, <Dictator> Leanna Sato, MD Duncan Dull MD ELECTRONICALLY SIGNED 09/01/2011 15:35

## 2014-05-27 NOTE — H&P (Signed)
PATIENT NAME:  [name] MeigsRIDE, Amanuel MR#:  [id number] DATE OF BIRTH:  Nov 01, 1960  DATE OF ADMISSION:  09/21/2012  PRIMARY CARE PROVIDER: Darreld McleanLinda Miles.   CHIEF COMPLAINT: Dizziness; elevated blood pressure.   HISTORY OF PRESENT ILLNESS: The patient is a 54 year old African American male with a history of poorly-controlled diabetes, hypertension, and depression, with a previous history of polysubstance abuse who was last admitted here in July 2013 with chest pain. At that time he had a negative Myoview, who states that he was doing okay up until recently when for about 1 week he started having dizziness. The patient states that he has run out of his blood pressure medications for the past few days. He went to the pharmacy to have his blood pressure checked, and he states that the machine rated his blood pressure to be 153/144. Therefore, EMS was called.   When he arrived here the patient's blood pressure was 116/117 and has gotten as high as 211/120. He had to be started on a labetalol drip. The patient was also noticed to have elevated troponin of 0.10.   We were asked to admit the patient for further evaluation.   He reports that he does get short of breath with exertion, but has not had any chest pains or palpitations. Has not had any fevers or chills. No abdominal pain, nausea, vomiting or diarrhea. The patient used to use drugs in the past, but reports that he has not used any drugs in the past  6 months. He used to smoke, but quit about a month ago as well.   PAST MEDICAL HISTORY: 1.  History of psychotic depression, with history of suicidality and frequent admissions in the past.  2.  History of polysubstance abuse with alcohol, tobacco, and cocaine in the past. He states that he stopped all this.  3.  Diabetes, on Lantus.  4.  Hypertension, poorly-controlled.   ALLERGIES. No known drug allergies.   CURRENT OUTPATIENT MEDICATIONS: He reports that he does was not taking his blood pressure  medication he is supposed to be on: Amlodipine 10 daily, fluoxetine 20 daily, Lantus  40 units at bedtime, lisinopril 40, 1 tablet p.o. daily, metformin 500 mg 1 tablet p.o. b.i.d., trazodone 50 at bedtime.   PAST SURGICAL HISTORY: None.   FAMILY HISTORY: Positive for hypertension, coronary artery disease and alcoholism.   SOCIAL HISTORY: He stopped smoking about a month ago. He used to drink; stopped drinking. He used to do illicit drugs; this he stopped 6 months ago.   REVIEW OF SYSTEMS:  CONSTITUTIONAL: Denies any fevers. Complains of fatigue, weakness. No pain. No weight loss. No weight gain.  EYES: No blurred or double vision. No pain. No redness. No inflammation. No glaucoma. No cataracts.  ENT: No tinnitus. No ear pain. No hearing loss. No seasonal or year-round allergies. No epistaxis. No snoring. No postnasal drip. No difficulty swallowing.  RESPIRATORY: Denies any cough, wheezing, hemoptysis. No COPD.  CARDIOVASCULAR: Denies any chest pain, orthopnea. No edema. No arrhythmias. Complains of dyspnea on exertion. No palpitations. No syncope.  GASTROINTESTINAL: No nausea, vomiting, diarrhea. No abdominal pain. No hematemesis. No melena. No ulcer. There is no GERD. There is no jaundice. No irritable bowel syndrome. No jaundice. No rectal bleeding.  GENITOURINARY: Denies any dysuria, hematuria, renal calculus or frequency.  ENDOCRINE: Denies any polyuria, nocturia or thyroid problems.  HEMATOLOGIC/LYMPHATIC: Denies any anemia, easy bruisability or bleeding.  SKIN: No acne. No rash. No changes in mole, hair or skin.  MUSCULOSKELETAL: Denies  any pain in the neck, back or shoulder.  NEUROLOGIC: No numbness. No CVA. No TIA. No seizures.  PSYCHIATRIC: Has a history of psychotic depression in the past; currently not depressed.   PHYSICAL EXAMINATION: VITAL SIGNS: Temperature 98.3, pulse 89, respirations 20, blood pressure 212/130 on presentation.  GENERAL: The patient is an obese [place]  American male, well-nourished and well-developed.   HEENT: Head normocephalic, atraumatic. Pupils equally round, reactive to light and accommodation. No conjunctival pallor. No scleral icterus. Extraocular movements intact. Nasal exam shows no nasal lesions, no drainage.  EARS: There is no drainage or erythema.  MOUTH: No exudates.  NECK: Supple and symmetric. No masses. Thyroid midline, not enlarged.  RESPIRATORY: Good respiratory effort. Clear to auscultation bilaterally, without any rales, rhonchi, or wheezing.  CARDIOVASCULAR: Regular rate and rhythm. No murmurs, clicks, gallops or heaves.  ABDOMEN: Soft, nontender, nondistended. Positive bowel sounds x 4. No hepatosplenomegaly.  GENITOURINARY: Deferred.  MUSCULOSKELETAL: There is no erythema or swelling.  SKIN: No rash.  LYMPHATICS: No lymph nodes palpable.  VASCULAR: Good DP, PT pulses.  NEUROLOGIC: Cranial nerves II through XII grossly intact. Strength is normal in both upper extremities.   PSYCHIATRIC: Not anxious or depressed.   LABORATORY AND RADIOLOGICAL DATA: PA and lateral chest x-ray shows no cardiopulmonary process. BNP is elevated at 684, glucose 339, BUN 18, creatinine 1.14, sodium 134, potassium 3.7, chloride 101, CO2 is 27, calcium is 9.1. LFTs are normal. CPK 262, CK-MB 4.0. Troponin less than 0.010. WBC 10.3, hemoglobin 15.3, platelet count 172.   ASSESSMENT AND PLAN: The patient is a 54 year old African American male with history of medication noncompliance: comes in with hypertensive malignant hypertension.   1.   Malignant hypertension: At this time will treat him with continue IV labetalol. We will restart his home regimen, which includes lisinopril, amlodipine. I will also has p.r.n. hydralazine. We will start him on clonidine b.i.d. I will check case toxic urine drug screen to make sure he is still not using cocaine.  2.  Elevated cardiac enzymes likely due to due to demand ischemia due to hypertensive. At this time,  will place him on aspirin, check serial cardiac enzymes. I will get an echocardiogram of the heart and eyelid in light of elevated cardiac enzymes and will have cardiology evaluate. The patient.  3.  Depression. We will continue trazodone, fluoxetine.  4.  Diabetes. Blood sugars in the 300s here. I will go ahead and increase his Lantus from his basal rate. Check a hemoglobin A one continue metformin as taking at home.    MISCELLANEOUS: In light of extremity elevated blood pressure I will hold heparin or Lovenox for the time being. We will just use SCDs.   NOTE: 50 minutes of critical care time spent on this patient the patient at risk of end-organ damage. He is on IV labetalol drip. Thank you    ____________________________ Lacie Scotts. Allena Katz, MD shp:dm D: 09/21/2012 14:55:00 ET T: 09/21/2012 15:40:39 ET JOB#: [id number]  cc: Natalye Kott H. Allena Katz, MD, <Dictator> Charise Carwin MD ELECTRONICALLY SIGNED 09/21/2012 16:33

## 2014-05-27 NOTE — Consult Note (Signed)
PATIENT NAME:  [name] [name], [name] [name] MR#:  [id number] DATE OF BIRTH:  11/07/1960  DATE OF CONSULTATION:  09/21/2012  REFERRING PHYSICIAN:  Auburn Bilberry, MD     CONSULTING PHYSICIAN:  Adrian Blackwater, MD/Milind Raether Eloy End, PA-C PRIMARY CARE PHYSICIAN: Darreld Mclean, MD   REASON FOR CONSULTATION: Elevated cardiac enzymes.   HISTORY OF PRESENT ILLNESS: Alton Bouknight is a 54 year old African American male with a past medical history significant for poorly controlled diabetes mellitus, hypertension, depression and polysubstance abuse. The patient was admitted here in July 2013 with chest pain and had a negative Lexiscan Myoview at that time. The patient notes that he has been out of his medications for at least a week and was starting to have symptoms of severe headaches, dizziness and blurry vision. He went to his local pharmacy, checked his blood pressure, which was extremely elevated, and 911 was summoned. The patient currently denies any chest pain, chest pressure, arm pain or neck pain. He has some shortness of breath on exertion, 2-pillow orthopnea but denies any PND. He does not have any palpitations, rapid heart rate or edema.   The patient has been started on labetalol drip, and most recent blood pressure is 174/112. He just had an echocardiogram performed at the bedside and results are pending.   PAST MEDICAL HISTORY: 1.  Hypertension, poorly controlled.  2.  Diabetes mellitus.  3.  History of polysubstance abuse with alcohol, tobacco, cocaine in the past. States he stopped all of this.  4.  History of psychotic depression with a history of suicidality and frequent admissions in the past.  5.  History of congestive heart failure per his patient history.   PAST SURGICAL HISTORY: None.   ALLERGIES: No known drug allergies.   HOME MEDICATIONS: The patient was not taking his medications for the last 1 week to 2 weeks.   The medicine he is supposed to be taking includes the following: 1.  Amlodipine 10  mg p.o. daily.  2.  Fluoxetine 20 mg p.o. daily.  3.  Lantus 40 units subcutaneously at bedtime.  4.  Lisinopril 40 mg 1 tablet p.o. daily.  5.  Metformin 500 mg 1 tablet p.o. twice daily.  6.  Trazodone 50 mg p.o. at bedtime.   FAMILY HISTORY: Positive for hypertension, valvular disease, coronary artery disease and alcoholism.   SOCIAL HISTORY: The patient states he is on Disability. Smoked 1 pack per day since age 58 and quit 1 month ago. The patient used to drink heavily and stopped 6 months ago. The patient has history of illicit drug use, and he also stopped this about 6 months ago.   REVIEW OF SYSTEMS:   CONSTITUTIONAL: The patient denies any fevers. Complains of fatigue, weakness. No weight loss.  EYES: The patient complains of blurry vision. No cataract.  EARS, NOSE AND THROAT: The patient denies any tinnitus, ear pain, hearing loss. Does have some dizziness.  RESPIRATORY: The patient has shortness of breath on exertion and 2-pillow orthopnea.  CARDIOVASCULAR: The patient denies chest pain, chest pressure, heaviness, edema, palpitations.  GASTROINTESTINAL: The patient denies any nausea, vomiting, abdominal pain   PHYSICAL EXAMINATION: GENERAL: This is a pleasant African American male who is not in any acute distress. He is alert and oriented x 3.  VITAL SIGNS: A temperature 98.3 degrees Fahrenheit, heart rate is 78, respiratory rate is 22, blood pressure 174/112, O2 sat is 96% on room air.  HEAD, EYES, EARS, NOSE, THROAT: Head: Atraumatic, normocephalic. Eyes: Pupils are round and equal.  Conjunctivae are pink. Ears and nose are normal to external inspection.  Mouth:  Poor dentition with multiple missing teeth.  NECK: Supple. Trachea is midline. There are no carotid bruits.  LUNGS: Clear to auscultation with no adventitious breath sounds, no accessory muscle use.  CARDIOVASCULAR: Regular rate and rhythm. No murmurs, rubs or gallops appreciated.  ABDOMEN: Obese. Bowel sounds are  present in all 4 quadrants. It is soft and nontender to palpation with no hepatosplenomegaly appreciated.  EXTREMITIES: No edema, has diminished pedal pulses bilaterally.   ANCILLARY DATA:  BNP is 684. Glucose is 339, BUN 18, creatinine 1.18, sodium was 134, potassium was 3.7, chloride is 101, CO2 is 27. Estimated GFR is greater than 60. Osmolality is 284. Calcium is 9.1, total protein 7.2, albumin 3.5, total bilirubin 0.3, alkaline phosphatase 101, AST 20, ALT 24. Total CK is 262. CK-MB 4.0. Troponin I 0.10.  White blood cell count is 10.3, hemoglobin 15.3, hematocrit 43.1, platelet count 172,000.   Chest x-ray: No acute abnormalities.   ASSESSMENT AND PLAN: 1.  Malignant hypertension. The patient had extremely high blood pressure on admission and was treated with IV labetolol, and his home medication regimen was started in addition to clonidine b.i.d. and hydralazine p.r.n. Continue to make sure the patient's blood pressure is in good control.  In the future, it may be a good idea to do a renal arterial Doppler ultrasound to rule out renal artery stenosis as the cause of his hypertension ( may be done as an outpatient).  2.  Elevated cardiac enzymes. The patient's elevations in cardiac enzymes are most likely due to demand ischemia. The patient is on aspirin.  An echocardiogram has been ordered to evaluate wall motion abnormality and left ventricular ejection fraction. The patient had negative stress testing approximately 1 year ago and does not have any acute EKG changes today. If the patient has wall motion abnormalities or gross elevations in cardiac enzymes, we can consider further diagnostic evaluation such as cardiac catheterization, however, no diagnostic intervention is planned at this time.  3.  Diabetes mellitus, poorly controlled, with blood sugars in the 300s. The patient has not been taking his medications at home and has been noncompliant.   Thank you very much for this consultation and  allowing us to participate in this patient's care.   ____________________________ Verta EllenMonica A. Laurelyn Terrero, PA-C mam:cb D: 09/21/2012 17:16:01 ET T: 09/21/2012 21:49:40 ET JOB#: [id number]  cc: Verta EllenMonica A. Andyn Sales, PA-C, <Dictator> Leanna SatoLinda M. Miles, MD Rooney Gladwin A [facility]ary[facility]ZI PA ELECTRONICALLY SIGNED 09/22/2012 8:48

## 2014-05-27 NOTE — Consult Note (Signed)
Does have anteroapical wall motion abnormalities, but since not having chest pains, and only mildly elevated troponin, will controll blood pressure and evaluate as out patient lexiscan myoview once BP stable.  Electronic Signatures: Radene KneeKhan, Cesar Alf Ali (MD)  (Signed on 18-Aug-14 18:05)  Authored  Last Updated: 18-Aug-14 18:05 by Radene KneeKhan, Brynlynn Walko Ali (MD)

## 2014-05-27 NOTE — Discharge Summary (Signed)
PATIENT NAME:  [name] [name], [name] [name] MR#:  [id number] DATE OF BIRTH:  1960-08-26  DATE OF ADMISSION:  09/21/2012 DATE OF DISCHARGE:  09/23/2012  For a detailed note, please take a look at the history and physical done on admission by Dr. Auburn Bilberry.  DIAGNOSES AT DISCHARGE:  Are as follows: 1.  Malignant hypertension secondary to medical noncompliance.  2.  Elevated troponin, likely in the setting of malignant hypertension and demand ischemia.  3.  Acute renal failure.  4.  Diabetes.  5.  Depression.   DIET: The patient was discharged home on a low-sodium, low-fat diet. American Diabetic Association diet.   ACTIVITY: As tolerated.   DISCHARGE FOLLOWUP:  Follow up with Dr. Darreld Mclean in the next 1 to 2 weeks. Also follow up with Dr. Adrian Blackwater in the next 1 to 2 weeks.   DISCHARGE MEDICATIONS: Trazodone 50 mg at bedtime, fluoxetine 20 mg daily, lisinopril 40 mg daily, metformin 500 mg b.i.d., amlodipine 10 mg daily, insulin glargine 55 units at bedtime and clonidine 0.2 mg b.i.d.   CONSULTANTS DURING HOSPITAL COURSE: Dr. Adrian Blackwater from cardiology.   PERTINENT STUDIES DONE DURING THE HOSPITAL COURSE: Are as follows: A chest x-ray done on admission showing no evidence of acute cardiopulmonary disease. A 2-dimensional echocardiogram done showing ejection fraction to be 50% to 55%. Moderate concentric left ventricular hypertrophy, mildly dilated left atrium, mild mitral valve regurgitation. No evidence of acute aortic stenosis or insufficiency.   HOSPITAL COURSE: This is a 54 year old male with medical problems as mentioned above, presented to the hospital on August 18th secondary to dizziness and noted to be severely hypertensive with blood pressures systolics in the 180s and diastolics over 100.   1.  Malignant hypertension. The patient was admitted to the hospital with significantly elevated blood pressures with systolic blood pressures over 045W, diastolics over 100s. This was secondary  to medical noncompliance, as the patient had not been taking his meds for the past few weeks. The patient was initially admitted to the Intensive Care Unit, and  started on labetalol drip. Eventually was taken off the labetalol drip and started on oral regimen, including  resuming his home meds of Norvasc and lisinopril, and clonidine was added to his regimen. After being back on his oral meds, his hemodynamics have significantly improved, and he is clinically asymptomatic now.  2.  Elevated troponin. The patient's troponin trended up only slightly as high as 0.09. A cardiology consult was obtained. The patient was seen by Dr. Adrian Blackwater. He thought the most likely cause of the patient's elevated troponin was demand ischemia from his malignant hypertension. His echocardiogram showed no significant wall motion abnormalities with an EF of 55%. Dr. Welton Flakes recommended getting a stress test as an outpatient upon discharge. The patient will continue followup with Dr. Welton Flakes as an outpatient.  3.  Acute renal failure. The patient's creatinine did bump up on the day after admission to as high as 1.6. He was gently hydrated with IV fluids. His creatinine since then and has come down to baseline to 1.2. Since he has a history of diabetes, his renal function needs to be followed closely as an outpatient.  4.  Diabetes: The patient's blood sugars were slightly uncontrolled in the hospital. His Lantus was, therefore, increased from 40 to 55 units. He is currently being discharged on the higher dose of Lantus along with metformin as stated.  5.  Depression. The patient had no suicidal or homicidal ideations. He was maintained on  his Prozac and trazodone, and he will resume that upon discharge.   CODE STATUS: The patient is a full code.   TIME SPENT: 40 minutes.   ____________________________ [name] PancakeVivek J. Cherlynn KaiserSainani, MD vjs:dmm D: 09/23/2012 15:30:53 ET T: 09/23/2012 20:39:24 ET JOB#: [id number]  cc: [name] PancakeVivek J. Cherlynn KaiserSainani, MD,  <Dictator> Laurier NancyShaukat A. Khan, MD Leanna SatoLinda M. Miles, MD Houston SirenVIVEK J Kaileb Monsanto MD ELECTRONICALLY SIGNED 09/28/2012 13:40

## 2014-05-27 NOTE — Consult Note (Signed)
PATIENT NAME:  [name] [name], [name] [name] MR#:  [id number] DATE OF BIRTH:  [birth year]  DATE OF CONSULTATION:  09/25/2012  REFERRING PHYSICIAN:  Dr. Luberta Mutter.  CONSULTING PHYSICIAN:  Hemang K. Sherryll Burger, MD  REASON FOR CONSULTATION: [place].  PRIMARY CARE PHYSICIAN:  Dr. Darreld Mclean.  HISTORY OF PRESENT ILLNESS: [name] is a 54 year old African American gentleman with a history of fairly uncontrolled hypertension and diabetes, who came to the hospital on Monday, around August 18, due to very high sugar of 600 and blood pressure (hypertensive urgency).   The patient's blood pressure was not coming down with pills, so he got started on IV medication.   On Tuesday morning, 09/22/2012, the patient felt like he had some numbness on the left side of the face and the arm, and he was having some difficulty swallowing and speaking, but that got better.   The patient went home on 08/28.  When he took a shower, he felt like there was numbness on the left side of the face and arm, that he could not feel the temperature of the water very well, so came back to the hospital to get checked out again.   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   The patient had other white matter microvascular ischemic changes.   The patient was not taking aspirin before his prior hospitalization.   I do not think the patient was taking a statin on a regular basis. The patient mentioned that he does have snoring and he wakes himself up. He is obese. He does have occasional excessive daytime sleepiness.   PAST MEDICAL HISTORY: Significant for hypertension, diabetes, medication noncompliance, history of depression, polysubstance abuse.   PAST SURGICAL HISTORY: Negative. He does not have any known drug allergies.   FAMILY HISTORY: Significant for hypertension and coronary artery disease.   SOCIAL HISTORY: Significant that he quit smoking around a month ago. He does have a history of  drinking alcohol, but he quit. He also is not doing drug abuse, per patient.  MEDICATIONS: I reviewed his home medication list.   REVIEW OF SYSTEMS: Positive for difficulty talking, numbness on the left face and left arm. Otherwise, 10 system review of systems  was negative.  Occasional wobbliness.    PHYSICAL EXAMINATION: GENERAL: He is a tall, muscular, obese African American gentleman here with his family members.  VITAL SIGNS: Temperature 98, pulse 77, respiratory rate 20, blood pressure 156/117.  The patient's pulse ox is 97.  LUNGS: Clear to auscultation.  HEART: S1, S2 heart sounds. Carotid exam did not reveal any bruit. Funduscopic exam was attempted.  MENTAL STATUS EXAMINATION: He was alert, oriented, followed two-step inverted commands. He does not have neurological neglect. His attention and concentration is appropriate. He does not have any language deficit that I can tell, but he does seem to have dysarthria on cranial nerves. His pupils were equal, round and reactive. Extraocular movements were intact. His visual fields seem to be full. He does have decreased movement of the right lower face. He also has some impaired movement of his tongue.   It is hard to know whether his  uvula is upgoing or not because he has very poor oral opening.  His neck strength seems to be 5/5.  On his motor exam, his strength was 5/5.  Tone was normal. His reflexes were symmetric, 2+.  On sensory exam, he has decreased sensation to light touch on his left face and left  arm.  The patient was able to stand up and walk okay.   ASSESSMENT AND PLAN:    The patient with acute onset of left-sided face and arm numbness on Tuesday morning, on August 19 with some speech difficulty and swallowing difficulty, as well as dysarthria and right lower facial weakness. Likely, he had a stroke at that time.    The patient has had some fluctuation since then.    His distribution is pontine paramedian branch of basilar artery  on the right, which is more close to pontomedullary junction, near hypoglossal nucleus, which explains his tongue movement abnormality, as well as some involvement of the lower part of his face.    His left hemibody numbness will be hard to explain, except his spinothalamic tract is involved, which can explain  the temperature change.   He does have left posterior/inferior cerebellar artery distribution small punctate infarct, but I do not see any ataxia on his exam, so I think it is a silent infarct. They might have happened at different times, but both of them looks like small vessel disease rather than embolic phenomenon.   The patient does have long-standing uncontrolled multiple vascular risk factors including obesity, hypertension, hyperlipidemia, poor HDL and uncontrolled diabetes.   The patient was not taking aspirin on a regular basis before. He should take 325 mg dose now. The patient should be on a statin. Avoid sudden lowering of blood pressure during peristroke event. Avoid nitrites and nitroglycerin as a blood pressure lowering agent in patients with acute stroke, which can increase the intracranial pain.   The patient should avoid any increased temperature and treat it aggressively.   The patient should take multivitamin and get a flu shot.   The patient should have aggressive speech therapy.   I talked to the patient about swallow evaluation and importance of preventing aspiration.   The patient had a carotid Doppler done, and I do not see the result yet, but the patient was told verbally that it was unremarkable.   The patient also had an echocardiogram done. The patient is getting telemetry monitoring to look for paroxysmal Afib.   The patient should have an outpatient sleep study done, as he is at high risk of having obstructive sleep apnea with the snoring and resuscitative snorting, excessive daytime sleepiness with his high Mallampati score, which I can arrange as an  outpatient.   The patient in the hospital will be followed  up by moonlighting neurologist, but feel free to contact me with any further questions.   ____________________________ Durene CalHemang K. Sherryll BurgerShah, MD hks:dmm D: 09/25/2012 17:41:23 ET T: 09/25/2012 20:26:46 ET JOB#: [id number]  cc: Hemang K. Sherryll BurgerShah, MD, <Dictator> Durene CalHEMANG K [facility] MD ELECTRONICALLY SIGNED 09/29/2012 16:20

## 2014-05-27 NOTE — Consult Note (Signed)
PATIENT NAME:  [name] MeigsRIDE, [name] [name] MR#:  [id number] DATE OF BIRTH:  1960/10/21  DATE OF CONSULTATION:  09/25/2012  REFERRING PHYSICIAN:   CONSULTING PHYSICIAN:  Hemang K. Sherryll BurgerShah, MD  ADDENDUM: In his exam, please add that the patient's memory and fund of knowledge seems to be appropriate. His coordination was also intact.  ____________________________ Hemang K. Sherryll BurgerShah, MD hks:sb D: 10/15/2012 16:21:48 ET T: 10/15/2012 16:56:46 ET JOB#: [id number]  cc: Hemang K. Sherryll BurgerShah, MD, <Dictator> Durene CalHEMANG K [facility] PLL[facility] MD ELECTRONICALLY SIGNED 10/19/2012 16:46

## 2014-05-27 NOTE — Consult Note (Signed)
Brief Consult Note: Diagnosis: Elevated cardiac enzymes, malignant HTN.   Consult note dictated.   Comments: Patient out of meds for at least 1 week, developed headache, dizziness, went to pharmacy and checked his BP which was very elevated and EMS summoned. Patient denies CP, chest pressure, jaw pain, and EKG with no acute changes. His elevations in CE could be from demand ischemia as had stress testing about 1 yr ago that was normal. Continue aggressive BP management, cycle cardiac enzymes, and check wall motion and LVEF on echo. If has wall motion abnormalities and/or significant increases in serial CE can consider further diagnostic evaluation such as cardiac cath.  Electronic Signatures: Radene KneeKhan, Shaukat Ali (MD)   (Signed 18-Aug-14 18:00)  Co-Signer: Brief Consult Note Maia PlanManzi, Dariyon Urquilla A (PA-C)   (Signed 18-Aug-14 17:07)  Authored: Brief Consult Note  Last Updated: 18-Aug-14 18:00 by Radene KneeKhan, Shaukat Ali (MD)

## 2014-05-27 NOTE — Discharge Summary (Signed)
PATIENT NAME:  [name] MeigsRIDE, Kratos MR#:  [id number] DATE OF BIRTH:  24-Aug-1960  DATE OF ADMISSION:  09/24/2012 DATE OF DISCHARGE:  09/25/2012  The patient left against medical advise on the evening of 09/25/2012.    For a detailed note, please see the history and physical on admission by Dr. Luberta MutterKonidena.  BRIEF HOSPITAL COURSE: This is a 54 year old male with a past medical history of malignant hypertension, diabetes, depression, who presented to the hospital with dizziness, slurred speech and also left arm numbness and weakness. The patient underwent work-up for a possible stroke given his acute neurologic symptoms. His MRI of the brain did show an acute/subacute stroke in the posterior aspect of the pons and also in the left side of the cerebellum. The patient was started on aspirin as he was not taking any meds. Also started on a statin. A neurology consult was obtained. The patient was seen by Dr. Cristopher PeruHemang Shah, who agreed with this management. The patient was awaiting a physical therapy evaluation and a possible speech evaluation. Although after seeing the neurologist, the patient was upset and wanted to leave the hospital. He was advised by the nursing staff that he would be discharged the next day, but he did not want to stay in the hospital and left against medical advice on the evening of 09/25/2012. The patient did not get a his prescriptions for his aspirin and his statin which he would have needed prior to discharge.   TIME SPENT: 35 minutes   ____________________________ Rolly PancakeVivek J. Cherlynn KaiserSainani, MD vjs:dp D: 09/26/2012 13:20:19 ET T: 09/26/2012 15:06:03 ET JOB#: [id number]  cc: Rolly PancakeVivek J. Cherlynn KaiserSainani, MD, <Dictator> Houston SirenVIVEK J Hensley Aziz MD ELECTRONICALLY SIGNED 10/17/2012 15:05

## 2014-05-27 NOTE — H&P (Signed)
PATIENT NAME:  [name] [name], [name] [name] MR#:  [id number] DATE OF BIRTH:  January 13, 1961  PRIMARY CARE PHYSICIAN: Darreld Mclean, MD  EMERGENCY ROOM  PHYSICIAN: Loleta Rose, MD   CHIEF COMPLAINT: Left facial numbness, left hand numbness.   HISTORY OF PRESENT ILLNESS: This is a 54 year old male patient who came in with left-sided numbness especially on the left face and left arm. The patient noticed that while taking a shower this morning, the patient was unable to feel the water temperature on his left face and also left hand. The patient was recently discharged from hospital yesterday. He was here from August 18 to 20, discharged by Dr. Cherlynn Kaiser for malignant hypertension due to noncompliance.  The patient went home and this morning, he noticed the numbness and some slurred speech, and while he was here, he noticed some difficulty in swallowing on the 19th, and at that time it looks like he told the nurse, but at the time of discharge, his trouble swallowing resolved.   The patient's initial CT did not show any changes but MRI is done in the ER itself, and MRI of the brain showed acute to subacute lacunar infarct on the right side. The patient denies any weakness in hands or legs. Does have some unstable gait. According to him, it is not new, but today he  noticed some slurred speech. No double vision. No headache. No numbness on the right side of the body. The patient has only numbness on the left side and left arm. No numbness in the left leg as well. Denies any other complaints.   PAST MEDICAL HISTORY: Significant for hypertension, diabetes, noncompliance. Also history of depression, polysubstance abuse.   ALLERGIES: No known allergies.   PAST SURGICAL HISTORY: None.   FAMILY HISTORY: Significant for hypertension and coronary artery disease.   SOCIAL HISTORY: Quit smoking a month ago. He has history of drinking before but quit now. No drugs. He used to use illicit drugs, stopped 6 months ago.   MEDICATIONS:  According to discharge summary yesterday, trazodone 50 mg p.o. at bedtime, fluoxetine 20 mg daily, lisinopril 40 mg p.o. daily, metformin 500 mg p.o. b.i.d., amlodipine 10 mg daily, Lantus 55 units at bedtime, clonidine 0.2 mg b.i.d.   REVIEW OF SYSTEMS:  CONSTITUTIONAL: No fever. No fatigue.  EYES: No blurred vision. No double vision.  ENT: No tinnitus. No ear pain. No hearing loss. No epistaxis. No difficulty swallowing. The patient had swallowing difficulty 2 days ago.  RESPIRATORY: No cough. No wheezing.  CARDIOVASCULAR: No chest pain. No orthopnea.  GASTROINTESTINAL: No nausea. No vomiting. No abdominal pain.  GENITOURINARY: No dysuria.  ENDOCRINE: No polyuria or nocturia.  INTEGUMENTARY: No skin rashes.  MUSCULOSKELETAL: No joint pains.  NEUROLOGIC: Has numbness on left side of face and left arm. No weakness. Does have some slurred speech. No tremors. No history of migraines. No memory loss. No seizures.  PSYCHIATRIC: No anxiety. Does have depression. Denies any suicidal ideation.   PHYSICAL EXAMINATION: VITAL SIGNS: Heart rate 83, blood pressure  182/105, temperature 98.1, heart rate as I mentioned, saturations 98% on room air.  GENERAL: A well-developed well-nourished, morbidly obese male not in distress.  HEAD:  Normocephalic, atraumatic.  EYES: Pupils equally reacting to light. Extraocular movements are intact.  NOSE: No turbinate hypertrophy.  EARS: No tympanic membrane congestion.  MOUTH: No oropharyngeal erythema.  NECK: Supple. No JVD. No carotid bruit.  RESPIRATORY: Clear to auscultation. No wheeze. No rales.  CARDIOVASCULAR: S1, S2 regular. No murmurs.  GASTROINTESTINAL: Abdomen  is soft, nontender, nondistended. Bowel sounds present. No hepatosplenomegaly.  MUSCULOSKELETAL: Does have some unstable gait.  LYMPHATICS: No lymphadenopathy in cervical or axillary region.  NEUROLOGIC: Cranial nerves II through XII intact. Power is 5/5 upper and lower extremities. Sensations  are diminished on the left side of face and also left arm.  SKIN: Warm and dry.  EXTREMITIES: No pedal edema.   DIAGNOSTIC STUDIES: MRI of the brain shows the punctate areas of subacute to acute infarcts in the posterior aspect of the pons inferiorly on the right and centrally within the cerebellum on the left. Findings consistent with small vessel white matter ischemic changes.   Chest x-ray showing right perihilar pneumonia.   CT head shows white matter changes.   UA is clear. CPK-MB 4.5, CK 280.    Electrolytes: Sodium 135, potassium 4.1, chloride 102, bicarbonate 27, BUN 16, creatinine 1.29 and glucose 354. WBC 9.2, hemoglobin 14.1, hematocrit 40.3, platelets 152.   EKG: Normal sinus rhythm with T-wave inversions in leads V4, V5, and V6 compared to EKG on August 18. No new changes.   ASSESSMENT AND PLAN: The patient is a 54 year old male who was recently discharged, came in with numbness of left side with evidence of cerebrovascular accident by MRI of the brain. Admit to hospitalist service for acute to subacute cerebrovascular accident. The patient has 2 infarcts,  one on the left side and one on the right side.   1.  The patient has left cerebellar and right lacunar infarct. The patient is at high risk for recurrent infarcts due to his risk factors of hypertension and diabetes. Admit him to hospitalist service on telemetry. Continue him on aspirin 325 mg daily,add statins,. Monitor with neurological checks and obtain physical therapy consult. Obtain carotid ultrasound. The patient did have an echocardiogram during last admission. Echocardiogram shows normal ejection fraction at 50% to 55%, so I am not going to repeat that. The patient will be continued on aspirin and physical therapy, occupational therapy. Speech therapy consult for slurred speech and dysphagia.  neuro  consult with Dr.Shah. 2.  Malignant hypertension. The patient is on amlodipine, clonidine, and lisinopril. Continue all of  that and add hydralazine 25 mg p.o. q.i.d.  3.  Diabetes mellitus, type 2. Continue Lantus and metformin along with sliding-scale coverage.  4.  Depression. Continue fluoxetine and trazodone.  5.  Questionable right hilar pneumonia. The patient is not hypoxic. Has no fever. No white count. Follow repeat chest x-ray in 24 hours.   TIME: 60 minutes.    ____________________________ Katha HammingSnehalatha Lu Paradise, MD sk:np D: 09/24/2012 18:01:00 ET T: 09/24/2012 20:22:49 ET JOB#: [id number]  cc: Leanna SatoLinda M. Miles, MD Katha HammingSnehalatha Muranda Coye, MD, <Dictator>    Katha HammingSNEHALATHA Lillee Mooneyhan MD ELECTRONICALLY SIGNED 09/25/2012 12:58

## 2014-05-28 ENCOUNTER — Emergency Department
Admit: 2014-05-28 | Disposition: A | Discharge status: Home / Self Care | Payer: Self-pay | Admitting: Emergency Medicine

## 2014-05-28 NOTE — Consult Note (Signed)
General Aspect [name] [name] is a 54yo African American male w/ PMHx s/f long-standing HTN, type 2 DM, h/o multiple CVAs, polysubstance abuse (cocaine/multiple other dose), bipolar disorder, HLD and morbid obesity who was admitted to [facility] today for shortness of breath and chest pain.   He was seen in clinic last month after discharge from [facility] 03/2013 for CVA. He was noted to be markedly hypertensive. 2D echo showed preserved EF, severe concentric LVH with diastolic dysfunction; ASD/PFO unable to be excluded. Mild LAE noted. This was thought be secondary to hypertensive hypertrophic cardiomyopathy. There was no evidence of a-fib; however, a TEE was recommended to r/o thrombus. The patient refused to stay for this and left AMA. Outpatient TEE was diligently arranged between the cardiology and hospitalist teams. He did not show for this.   He endorsed exertional chest tightness and dyspnea on follow-up. He was noted to be markedly hypertensive in clinic. BP 171/115. Antihypertensives were adjusted (reflected in home meds). The plan was made to pursue renal artery dopplers and consider ischemic evaluation on follow-up. No mention of follow-up TEE. Drug, specifically cocaine, -abuse was strongly discouraged. The patient's sponsor was present at the appointment.    He was scheduled to be seen today, but was unable to make his appointment. He reports experiencing progressively worsening DOE, orthopnea and PND over the past 2 days. He does note intermittent chest tightness, mostly when laying flat, which he describes as "anxiety." He cannot relate this symptom to exertion. He does report unilateral leg swelling (R>L). No pain or redness. Denies abdominal distention, early statiety or weight gain.   Telephone note from last month reports patient was dyspneic, lethargic during echo performed in office (renal dopplers?). O2 sat noted to be 93%. This improved of Seroquel after he was referred back to his PCP.   His  symptoms worsened and he presented to [facility] ED.   Present Illness EKG performed by EMS revealed NSR, LAD and TWIs V5, V6, I, aVL and downsloping ST depressions in I aVL. Initial TnI returned elevated at 0.09. Of note, UDS returned positive for MDMA. Patient adamantly denies taking illicit drugs. Pharmacy has been consulted regarding possible drug effects from home meds and TRAZODONE can cross-react to cause a false-positive. BNP returned elevated at 555. CXR- mild vascular congestion and borderline cardiomegaly noted; lungs remain grossly clear. CBC WNL. BUN 19/Cr 1.44 - ***Of note, his Cr level was 1.45 upon d/c in February - indicating stable CKD ~2-3***  VS were largely WNL aside from tachypnea (20-22). SpO2 98% on RA. HR WNL.   He was admitted by the hospitalist service. Cardiology was consulted.   Breathing has been unchanged.   PAST MEDICAL HISTORY:  1. Polysubstance abuse.  2. Hypertension.  3. Stroke.  4. Diabetes mellitus.  5. Hyperlipidemia. 6. Chronic renal insufficiency. 7. Obesity.   PAST SURGICAL HISTORY: None.   PSYCHOSOCIAL HISTORY: Lives at home. Used to smoke, but quit smoking recently, but the patient still has a 20-pack-year smoking history. Occasional alcohol use. History of polysubstance abuse, MDMA is positive.   FAMILY HISTORY: Mother died of complications of diabetes. She had an "enlarged heart" and possibly prior MI per patient. Father is alive and has high blood pressure.   Physical Exam:  GEN no acute distress, obese, disheveled   HEENT pink conjunctivae, PERRL, hearing intact to voice, poor dentition   NECK supple  No masses  trachea midline  no evidence of JVD or bruits   RESP clear BS  no  use of accessory muscles  mildly tachypneic, no appreciable wheezing, rales or rhonchi   CARD Regular rate and rhythm  Normal, S1, S2  S4  Murmur  No murmur   Murmur Systolic   Systolic Murmur Out flow  2/6 SEM @ RUSB   ABD denies tenderness  soft  normal BS    EXTR negative cyanosis/clubbing, positive edema, R>L LE nonpitting edema ~1+   SKIN normal to palpation   NEURO cranial nerves intact, follows commands, motor/sensory function intact   PSYCH alert, A+O to time, place, person   Review of Systems:  Subjective/Chief Complaint shortness of breath; Chest tightness   ENT: No epistaxis   Respiratory: Short of breath   Cardiovascular: Chest pain or discomfort  Tightness  Dyspnea  Orthopnea  Edema  PND   Gastrointestinal: No melena, hematochezia   Genitourinary: No hematuria   Review of Systems: All other systems were reviewed and found to be negative   Medications/Allergies Reviewed Medications/Allergies reviewed  H&P medication list is inaccurate.     chronic diastolic heart failure:    Hypertensive heart disease:    CVA/Stroke:    CHF:        Admit Diagnosis:   CHEST PAIN POLYSUBSTANCE ABUSE: Onset Date: 12-May-2013, Status: Active, Description: CHEST PAIN POLYSUBSTANCE ABUSE  Home Medications: Medication Instructions Status  aspirin 325 mg oral delayed release tablet 1 tab(s) orally once a day Active  insulin glargine 100 units/mL subcutaneous solution 55 unit(s) subcutaneous once a day (at bedtime) Active  cloNIDine 0.2 mg oral tablet 1 tab(s) orally 2 times a day Active  traZODone 50 mg oral tablet 1 tab(s) orally once a day (at bedtime) Active  FLUoxetine 20 mg oral capsule 1 cap(s) orally once a day Active  lisinopril 40 mg oral tablet 1 tab(s) orally once a day for blood pressure Active  metFORMIN 500 mg oral tablet 1 tab(s) orally 2 times a day for diabetes Active  amLODIPine 10 mg oral tablet 1 tab(s) orally once a day for blood pressure Active   Lab Results:  Cardiology:  08-Apr-15 05:30   Ventricular Rate 67  Atrial Rate 67  P-R Interval 160  QRS Duration 104  QT 424  QTc 448  P Axis 49  R Axis -29  T Axis 155  ECG interpretation Normal sinus rhythm ST & T wave abnormality, consider inferolateral  ischemia Abnormal ECG When compared with ECG of 22-Mar-2013 11:53, Criteria for Septal infarct are no longer Present ----------unconfirmed---------- Confirmed by OVERREAD, NOT ([id number]), editor PEARSON, BARBARA ([id number]) on 05/12/2013 3:20:31 PM  Routine Chem:  08-Apr-15 04:43   Result Comment TROPONIN - RESULTS VERIFIED BY REPEAT TESTING.  - CALLED TO HENRY RIVERA AT 0524 ON  - 05/12/2013.Marland KitchenMarland KitchenTPL  - READ-BACK PROCESS PERFORMED.  Result(s) reported on 12 May 2013 at 05:28AM.  Glucose, Serum  368  BUN  19  Creatinine (comp)  1.44  Sodium, Serum  133  Potassium, Serum 4.0  Chloride, Serum 98  CO2, Serum 31  Calcium (Total), Serum 8.9  Anion Gap  4  Osmolality (calc) 284  eGFR (African American) >60  eGFR (Non-African American)  55 (eGFR values <71mL/min/1.73 m2 may be an indication of chronic kidney disease (CKD). Calculated eGFR is useful in patients with stable renal function. The eGFR calculation will not be reliable in acutely ill patients when serum creatinine is changing rapidly. It is not useful in  patients on dialysis. The eGFR calculation may not be applicable to patients at the low and high  extremes of body sizes, pregnant women, and vegetarians.)  B-Type Natriuretic Peptide ([facility])  555 (Result(s) reported on 12 May 2013 at 05:24AM.)    08:53   Result Comment TROPONIN - RESULTS VERIFIED BY REPEAT TESTING.  - RESULTS PREVIOUSLY CALLED BY TPL  - @ 0524 ON 05/12/13-TMS  Result(s) reported on 12 May 2013 at 10:02AM.    12:25   Result Comment TROPONIN - RESULTS VERIFIED BY REPEAT TESTING.  - RESULT CALLED PREVIOUSLY AT 0524  - 05/12/13 BY TPL-DAS  Result(s) reported on 12 May 2013 at 01:34PM.  Urine Drugs:  [id number] 03:21   Tricyclic Antidepressant, Ur Qual (comp) NEGATIVE (Result(s) reported on 12 May 2013 at 07:40AM.)  Amphetamines, Urine Qual. NEGATIVE  MDMA, Urine Qual. POSITIVE  Cocaine Metabolite, Urine Qual. NEGATIVE  Opiate, Urine qual NEGATIVE  Phencyclidine, Urine Qual.  NEGATIVE  Cannabinoid, Urine Qual. NEGATIVE  Barbiturates, Urine Qual. NEGATIVE  Benzodiazepine, Urine Qual. NEGATIVE (----------------- The URINE DRUG SCREEN provides only a preliminary, unconfirmed analytical test result and should not be used for non-medical  purposes.  Clinical consideration and professional judgment should be  applied to any positive drug screen result due to possible interfering substances.  A more specific alternate chemical method must be used in order to obtain a confirmed analytical result.  Gas chromatography/mass spectrometry (GC/MS) is the preferred confirmatory method.)  Methadone, Urine Qual. NEGATIVE  Cardiac:  08-Apr-15 04:43   CK, Total 260 (39-308 NOTE: NEW REFERENCE RANGE  03/08/2013)  CPK-MB, Serum  4.4 (Result(s) reported on 12 May 2013 at 08:41AM.)  Troponin I  0.09 (0.00-0.05 0.05 ng/mL or less: NEGATIVE  Repeat testing in 3-6 hrs  if clinically indicated. >0.05 ng/mL: POTENTIAL  MYOCARDIAL INJURY. Repeat  testing in 3-6 hrs if  clinically indicated. NOTE: An increase or decrease  of 30% or more on serial  testing suggests a  clinically important change)    08:53   CK, Total 271 (39-308 NOTE: NEW REFERENCE RANGE  03/08/2013)  CPK-MB, Serum  4.5 (Result(s) reported on 12 May 2013 at 10:02AM.)  Troponin I  0.06 (0.00-0.05 0.05 ng/mL or less: NEGATIVE  Repeat testing in 3-6 hrs  if clinically indicated. >0.05 ng/mL: POTENTIAL  MYOCARDIAL INJURY. Repeat  testing in 3-6 hrs if  clinically indicated. NOTE: An increase or decrease  of 30% or more on serial  testing suggests a  clinically important change)    12:25   CK, Total 241 (39-308 NOTE: NEW REFERENCE RANGE  03/08/2013)  CPK-MB, Serum  4.1 (Result(s) reported on 12 May 2013 at 01:18PM.)  Troponin I  0.06 (0.00-0.05 0.05 ng/mL or less: NEGATIVE  Repeat testing in 3-6 hrs  if clinically indicated. >0.05 ng/mL: POTENTIAL  MYOCARDIAL INJURY. Repeat  testing in 3-6 hrs if   clinically indicated. NOTE: An increase or decrease  of 30% or more on serial  testing suggests a  clinically important change)  Routine Hem:  08-Apr-15 04:43   WBC (CBC) 9.2  RBC (CBC) 4.49  Hemoglobin (CBC) 14.3  Hematocrit (CBC) 40.5  Platelet Count (CBC) 218 (Result(s) reported on 12 May 2013 at [facility].)  MCV 90  MCH 31.9  MCHC 35.2  RDW 12.6   EKG:  EKG Interp. by me   Interpretation NSR, LAD, TWIs V5, V6, I, aVL, 1 mm downsloping ST depressions I, aVL   Rate 86   EKG Comparision Changed from  improved from 04/2013 office tracing   Additional Comments ST-T wave abnormality is c/w LVH related repolarization abnormality.  Radiology Results: XRay:    08-Apr-15 05:14, Chest Portable Single View  Chest Portable Single View   REASON FOR EXAM:    Chest pain  COMMENTS:       PROCEDURE: DXR - DXR PORTABLE CHEST SINGLE VIEW  - May 12 2013  5:14AM     CLINICAL DATA:  Chest pain and shortness of breath.    EXAM:  PORTABLE CHEST - 1 VIEW    COMPARISON:  Chest radiograph from 03/22/2013    FINDINGS:  The lungs are well-aerated. Mild vascular congestion is noted. There  is no evidence of focal opacification, pleural effusion or  pneumothorax.    The cardiomediastinal silhouette is borderline enlarged. No acute  osseous abnormalities are seen.     IMPRESSION:  Mild vascular congestion and borderline cardiomegaly noted; lungs  remain grossly clear.      Electronically Signed    By: Garald Balding M.D.    On: 05/12/2013 05:30       Verified By: JEFFREY . CHANG, M.D.,    No Known Allergies:   Vital Signs/Nurse's Notes: **Vital Signs.:   08-Apr-15 07:51  Temperature Temperature (F) 97.7  Celsius 36.5  Temperature Source oral  Pulse Pulse 73  Respirations Respirations 20  Systolic BP Systolic BP 478  Diastolic BP (mmHg) Diastolic BP (mmHg) 295  Mean BP 123  BP Source  if not from Vital Sign Device right arm  Pulse Ox % Pulse Ox % 95  Pulse Ox Activity  Level  At rest  Oxygen Delivery Room Air/ 21 %    07:55  Vital Signs Type Recheck  Pulse Pulse 75  Systolic BP Systolic BP 621  Diastolic BP (mmHg) Diastolic BP (mmHg) 308  Mean BP 130  BP Source  if not from Vital Sign Device left arm    10:35  Vital Signs Type Recheck  Systolic BP Systolic BP 657  Diastolic BP (mmHg) Diastolic BP (mmHg) 96  Mean BP 115    11:45  Vital Signs Type Routine  Temperature Temperature (F) 97.4  Celsius 36.3  Temperature Source oral  Pulse Pulse 73  Respirations Respirations 20  Systolic BP Systolic BP 846  Diastolic BP (mmHg) Diastolic BP (mmHg) 96  Mean BP 114  Pulse Ox % Pulse Ox % 94  Pulse Ox Activity Level  At rest  Oxygen Delivery Room Air/ 21 %    17:19  Vital Signs Type Routine  Temperature Temperature (F) 98.7  Celsius 37  Temperature Source oral  Pulse Pulse 78  Respirations Respirations 20  Systolic BP Systolic BP 962  Diastolic BP (mmHg) Diastolic BP (mmHg) 96  Mean BP 115  Pulse Ox % Pulse Ox % 94  Pulse Ox Activity Level  At rest  Oxygen Delivery Room Air/ 21 %  *Intake and Output.:   08-Apr-15 07:46  Grand Totals Intake:  0 Output:  0    Net:  0 24 Hr.:  0    09:10  Grand Totals Intake:   Output:  300    Net:  -300 24 Hr.:  -300    13:16  Grand Totals Intake:  240 Output:      Net:  952 84 Hr.:  -60    14:40  Grand Totals Intake:   Output:  700    Net:  -[place]  -760    Shift 15:00  Grand Totals Intake:  240 Output:  1000    Net:  -[place].:  -132  17:22  Grand Totals Intake:   Output:  200    Net:  -200 24 Hr.:  -960    Shift 23:00  Grand Totals Intake:   Output:  200    Net:  -51 24 Hr.:  -38    Impression Pt with known Hypertensive Heart disease.  does endorse chest tightness described as "anxiety" when laying flat. He is unable to associate this w/ exertion, but has not been active in the past few days.  Objectively, TnI trend shows a small plateau, CK-MB indicates a small  down-trend. He does appear to be mildly azotemic with a mild renal insufficiency that could affect troponin levels -- but this is chronic & not acute. EKG without new specific EKG changes. BP has not been markedly elevated- one episode where SBP 170s -- this is acutally not very high for him & would be hard to explain as the cause of acute on chronic diastolic HF with CP & SOB. BNP is elevated at 555. He had an episode of low O2 sat's in the clinic last month. He remains tachypneic today. There is evidence of unilateral leg swelling (R>L). He does not appear grossly volume overloaded on exam. -- Check D-dimer +/- LE dopplers to r/o PE  He has considerable Cardiac RFs of DM, HTN (poorly controlled), HLD & obesity (Metabolic Syndrome) & has now had 2 admissions with mild troponin elevation; this time with associated CP/SOB.  Cannot exclude ACS, although Accelerated HTN & A on C DH is more likely with demand ischemia.  Diagnostic options include non-invasive Myoview ST vs. invasive Cardiac Cath.  Ultimately we will need to delineate his coronary anatomy to confirm or deny active ischemic CAD along with his known Hypertensive Heart Disease.   ** Recommend Left Heart Cath (to measure LVEDP - no need for LV Gram) with Coronary Angiography +/- PCI to definitively clarify this clinical situation.  I have explained the R/B/A/I of cath to the patient in detail & answered all ?s.  He agrees to proceed. -- Gentle IV hydration ~ 4 AM for scheduled 2nd case Cath with Dr. Rockey Situ (femoral approach) -- hold ACE-I until post cath; hold metformin 48h post cath -- continue ASA -- should patient require PCI, would use BMS to minimize time requirement for DAPT   Plan 2. Hypertension- SBP mostly running 130-150s today. One outlier- 172/109.  -- Start hydralazine TID dosing -- Match Isordil frequency to TID -- Continue clonidine, amlodipine; restart ACE-I post cath -- Depending upon LVEDP would consider Furosemide over  Chlorthatlidone -- Consider adding low-dose BB - was not added due to h/o cocaine. -- Review renal dopplers performed as OP  3. Renal insufficiency- ? chronic. Cr 1.44 in February. Chlorthalidone, lisinopril effect (started last month) could contribute --  -- Hold chlorthalidone, lisinopril, metformin due to renal insufficiency pre-cath -- Gently hydrate -- Check BMP tomorrow AM  4. H/o multiple CVAs- no new neurologic deficits. Missed outpatient TEE scheduled after last admission. Maintaining NSR on telemetry (no evidence of a-fib). Mild LAE on prior echo places him at risk for a-fib. -- Defer decision re +/- TEE to Dr. Rockey Situ (after reviewing initial TTE); probably not necessary. -- Continue to monitor rhythm on tele  5. Hyperlipidemia LDL 66 in 03/2013. -- Continue statin  6. Type 2 DM, uncontrolled Hgb A1C 10.1% in 03/2013 -- Glycemic control per primary team -- Hold metformin w/ renal insufficiency, possible cath   Electronic Signatures for Addendum Section:  Leonie Man (MD) (Signed  Addendum 08-Apr-15 18:48)  I have seen & evaluated the patient this PM along with MR. Ziah Leandro, PA-C.  I agree with the note above -- personally modified original note with ammendments.    Mr. Goehring has multiple cardiac RFs & presented with Chest Pain @ rest associated with dyspnea, mild troponin & RNP elevation.  Cannot exclude ACS, although accelerated HTN with A on C DHF is the likely etiology, he has had episodes of chest discomfort since his last admission as noted in my Hospital F/u clinic note (on [place]).  His renal function is actually stable from last admission, so OK to use ACE-I.   He was actually scheduled to see me in clinic today, but was admitted instead.  My plan for OP f/u for intermitted chest discomfort & exertional dyspnea was non-invasive ischemic evaluation; however, in light of his current acute presentation was resting CP/SOB, I think a more definitive ischemic  evaluation is warranted.  I have recommended diagnositic Caridac Cath -- coronary angiography will clarify Unstable Angina (Class III-IV symptoms) vs. Hypertensive Heart Disease mediated supply vs. demand ischemia.  LHC with LV EDP measurement will help determine the need for additional diuresis & afterload reduction.   Electronic Signatures: Meriel Pica (PA-C)  (Signed 08-Apr-15 14:47)  Authored: General Aspect/Present Illness, History and Physical Exam, Review of System, Home Medications, Labs, EKG , Radiology, Allergies, Vital Signs/Nurse's Notes, Impression/Plan Leonie Man (MD)  (Signed 08-Apr-15 18:40)  Authored: General Aspect/Present Illness, History and Physical Exam, Review of System, Past Medical History, Health Issues, Labs, EKG , Vital Signs/Nurse's Notes, Impression/Plan  Co-Signer: General Aspect/Present Illness, History and Physical Exam, Review of System, Home Medications, Labs, EKG , Radiology, Allergies, Vital Signs/Nurse's Notes, Impression/Plan   Last Updated: 08-Apr-15 18:48 by Leonie Man (MD)

## 2014-05-28 NOTE — H&P (Signed)
PATIENT NAME:  [name] MeigsRIDE, Shadow MR#:  [id number] DATE OF BIRTH:  [birth year]  DATE OF ADMISSION:  05/12/2013  PRIMARY CARE PHYSICIAN: Leanna SatoLinda M. Miles, MD  REFERRING PHYSICIAN: Rebecka ApleyAllison P. Webster, MD  CHIEF COMPLAINT: Chest pain with shortness of breath.   HISTORY OF PRESENT ILLNESS: The patient is a 54 year old African-American male with past medical history of hypertension, insulin-dependent diabetes mellitus, hyperlipidemia, obesity, chronic renal insufficiency, recent history of stroke with dysarthria, and polysubstance abuse, who is presenting to the ER with a chief complaint of chest pain and shortness of breath since yesterday evening. The patient is reporting that suddenly he developed chest pain in the middle of the chest which was squeezing in nature, 7 out of 10, with no radiation, but associated with shortness of breath and dizziness. Has diaphoresis and nausea, but denies vomiting. No loss of consciousness. As the chest pain was not going away, the patient came into the ER. The patient was just recently admitted to the hospital on February 16th and diagnosed with acute stroke. Here, the patient's chest x-ray did not reveal any acute findings. Troponin initially is at 0.09. The patient was placed on oxygen, and hospitalist team is called to admit the patient. Urine drug screen was positive for MDMA.  PAST MEDICAL HISTORY:  1. Polysubstance abuse.  2. Hypertension.  3. Stroke.  4. Diabetes mellitus.  5. Hyperlipidemia. 6. Chronic renal insufficiency. 7. Obesity.   PAST SURGICAL HISTORY: None.   ALLERGIES: No known drug allergies.   PSYCHOSOCIAL HISTORY: Lives at home. Used to smoke, but quit smoking recently, but the patient still has a 20-pack-year smoking history. Occasional alcohol use. History of polysubstance abuse, MDMA is positive.   FAMILY HISTORY: Mother died of complications of diabetes. Father is alive and has high blood pressure.   MEDICATIONS:  1. Trazodone 50 mg p.o.  once daily.  2. Metformin 500 mg 2 times a day.  3. Lisinopril 40 mg once daily. 4. Insulin Lantus 55 units subcutaneous once daily at bedtime. 5. Fluoxetine 20 mg once daily.  6. Clonidine 0.2 mg 1 tablet p.o. 2 times a day.  7. Aspirin 325 mg 1 tablet p.o. once daily.   REVIEW OF SYSTEMS:  CONSTITUTIONAL: Denies any fever, fatigue or weakness.  EYES: Denies blurry vision, double vision, glaucoma.  ENT: Denies epistaxis, discharge, ear pain, hearing loss.  RESPIRATION: Denies cough. Has no history of COPD.  CARDIOVASCULAR: Complaining of midsternal chest pain which is squeezing in nature. No radiation. Associated with shortness of breath. Denies any palpitations, syncope.  GASTROINTESTINAL: Complaining of nausea. Denies vomiting, diarrhea. No abdominal pain. No hematemesis. GENITOURINARY: No dysuria or hematuria.  ENDOCRINE: Denies polyuria, nocturia, thyroid problems. HEMATOLOGIC AND LYMPHATIC: No anemia, easy bruising or bleeding.  INTEGUMENTARY: No acne, rash, lesions.  MUSCULOSKELETAL: No joint pain in the neck, back or shoulders, gout.  NEUROLOGIC: Had recent history of stroke with no deficits, but has dysarthria.  PSYCHIATRIC: No ADD, OCD or insomnia.   PHYSICAL EXAMINATION:  VITAL SIGNS: Temperature 95.8, pulse 65, respirations 22, blood pressure 156/84, pulse oximetry 95% on 2 liters.  GENERAL APPEARANCE: Not under acute distress. Moderately built and nourished.  HEENT: Normocephalic, atraumatic. Pupils are equally reacting to light and accommodation. No scleral icterus. No conjunctival injection. No sinus tenderness. No postnasal drip.  NECK: Supple. No JVD. No thyromegaly. Range of motion is intact.  LUNGS: Clear to auscultation bilaterally.  CARDIAC: S1, S2 normal. Regular rate and rhythm. No murmurs. GASTROINTESTINAL: Bowel sounds are positive in all 4  quadrants. Nontender, nondistended. No hepatosplenomegaly.  NEUROLOGIC: Awake and alert.   LABORATORY AND IMAGING  STUDIES: Troponin 0.09. Urine drug screen is positive for MDMA. WBC 9.2, hemoglobin 14.0, hematocrit 45.5,  Glucose 368, BNP 555, BUN 19, creatinine 1.44, sodium 133, potassium 4.8. Chest x-ray: Mild vascular congestion, borderline cardiomegaly noted. Lungs remain grossly clear. A 12-lead EKG: Normal sinus rhythm at 67 beats per minute, normal PR and QRS interval. No acute ST-T wave changes. T wave inversions were noticed in lead I and aVL.   ASSESSMENT AND PLAN: A 54 year old African-American male presenting to the ER with a chief complaint of chest pain and shortness of breath that started yesterday evening. Will be admitted with the following assessment and plan.   1. Chest pain, rule out acute myocardial infarction. Admit him to telemetry. Cycle cardiac biomarkers.  Oxygen, aspirin, nitroglycerin and statin. Hold off on beta blocker as the patient has history of polysubstance abuse.  2. Hypertension. Resume his home medications except ACE inhibitor in view of renal insufficiency.  3. Recent history of stroke with dysarthria. Continue aspirin and statin.  4. History of polysubstance abuse. Urine tox screen is positive for MDMA.  5. Insulin-dependent diabetes mellitus. The patient will be on sliding scale insulin and resume his Lantus. Metformin is on hold in view of renal insufficiency.  6. Chronic renal insufficiency. Avoid nephrotoxins. Monitor renal function closely. Hold metformin and ACE inhibitor.  7. Will provide gastrointestinal and deep vein thrombosis prophylaxis.   CODE STATUS: The patient is full code.   Plan of care discussed in detail with the patient. He is aware of the plan.   TOTAL TIME SPENT ON ADMISSION: 50 minutes.    ____________________________ Ramonita Lab, MD ag:lb D: 05/12/2013 08:05:54 ET T: 05/12/2013 08:40:36 ET JOB#: [id number]  cc: Ramonita Lab, MD, <Dictator> Leanna Sato, MD Ramonita Lab MD ELECTRONICALLY SIGNED 05/30/2013 8:14

## 2014-05-28 NOTE — Discharge Summary (Signed)
PATIENT NAME:  [name] MeigsRIDE, [name] [name] MR#:  [id number] DATE OF BIRTH:  1960/09/28  DATE OF ADMISSION:  05/12/2013 DATE OF DISCHARGE:  05/13/2013  ADMISSION DIAGNOSIS: Chest tightness and shortness of breath.   DISCHARGE DIAGNOSES: 1.  Acute on chronic diastolic heart failure.  2.  Chest pain.  3.  Elevated troponin secondary to demand ischemia.  4.  Diabetes. 5.  Hypertension.  6.  Bipolar affective disorder.   CONSULTATIONS: Cardiology.   PROCEDURES: Cardiac catheterization on 05/13/2013 which showed no evidence of significant stenosis.   HOSPITAL COURSE: A very pleasant 54 year old male with a history of diastolic heart failure, EF was normal in February 2015, who presented with chest tightness and shortness of breath. For further details, please refer to the H and P.  1.  Acute on chronic diastolic heart failure. The patient was admitted to telemetry. He was diuresed using IV Lasix. On physical exam at discharge his lungs are clear to auscultation. He is not requiring any oxygen and  not complaining of any shortness of breath.  2.  Chest pain, likely secondary to acute on chronic diastolic heart failure. He underwent a cardiac catheterization after cardiology had seen him. He essentially has nonsignificant coronary artery disease.  3.  Elevated troponin. The patient had a slight elevation of troponins and then they trended down. This is likely secondary to demand ischemia from problem #1.  4.  Diabetes. The patient will continue outpatient medications. We did refer him to Dr. Tedd SiasSolum, as per the request of his case manager.  5.  Hypertension. The patient will continue on his outpatient medications. He has uncontrolled hypertension as evidenced by his cardiac catheterization. I increased his clonidine as well as his Imdur. He will continue on lisinopril and Norvasc. He will need close follow-up for his blood pressure. This is discussed with him and his case manager in great detail. 6.  Bipolar. The  patient will continue on his Prozac.  DISCHARGE MEDICATIONS: 1.  Clonidine 0.3 mg p.o. b.i.d.  2.  Trazodone 50 mg at bedtime.  3.  Fluoxetine 20 mg daily.  4.  Lisinopril 40 mg daily.  5.  Insulin glargine 55 units at bedtime.  6.  Aspirin 325 mg daily.  7.  Imdur 30 mg 3 tablets daily.  8.  Norvasc 10 mg daily.   DISCHARGE DIET: Low sodium, ADA diet.   DISCHARGE ACTIVITY: As tolerated.  DISCHARGE FOLLOWUP: The patient will follow up with Dr. Mariah MillingGollan in 1 week. The patient will follow up with Dr. Darreld McleanLinda Miles in 1 week. The patient also had a referral for Dr. Tedd SiasSolum.  The patient was medically stable for discharge. Plan of care was discussed with the patient and the patient's case manager.  TIME SPENT: Approximately 40 minutes.   ____________________________ Janyth ContesSital P. Juliene PinaMody, MD spm:sb D: 05/13/2013 14:17:21 ET T: 05/13/2013 15:20:06 ET JOB#: [id number]  cc: Nikia Mangino P. Juliene PinaMody, MD, <Dictator> Leanna SatoLinda M. Miles, MD Antonieta Ibaimothy J. Gollan, MD A. Wendall MolaMelissa Solum, MD Patricia PesaSITAL P Jennifermarie Franzen MD ELECTRONICALLY SIGNED 05/14/2013 12:50

## 2014-05-28 NOTE — Consult Note (Signed)
Referring Physician:  Henreitta Leber   Primary Care Physician:  [facility], [address], [place], [place] [address], [place] [phone]  Reason for Consult: Admit Date: 22-Mar-2013  Chief Complaint: L sided weakness  Reason for Consult: CVA   History of Present Illness: History of Present Illness:   54 yo RHD M presents to [facility] secondary to L hand weakness.  Pt states that he was partying all night Thursday when he felt like he did too much.  He admitted to smoking crack and drinking a pint of Vodka.  He did not really see anyone but noted that he was a little weak.  His sister noticed that his voice changed so she asked him to come see the doctor.  He feels stronger now and does not have a headache any longer.  He denies a regular headache or any personality changes.  ROS:  General denies complaints   HEENT no complaints   Lungs no complaints   Cardiac no complaints   GI no complaints   GU no complaints   Musculoskeletal no complaints   Extremities no complaints   Skin no complaints   Neuro no complaints   Endocrine no complaints   Psych no complaints   Past Medical/Surgical Hx:  CVA/Stroke:   CHF:   bipolor:   schizophrenia:   diabetes:   Drug Abuse:   Alcohol Abuse:   Depression:   HTN:   Past Medical/ Surgical Hx:  Past Medical History as above   Past Surgical History as above   Home Medications: Medication Instructions Last Modified Date/Time  insulin glargine 100 units/mL subcutaneous solution 55 unit(s) subcutaneous once a day (at bedtime) 16-Feb-15 12:18  cloNIDine 0.2 mg oral tablet 1 tab(s) orally 2 times a day 16-Feb-15 12:18  traZODone 50 mg oral tablet 1 tab(s) orally once a day (at bedtime) 16-Feb-15 12:18  FLUoxetine 20 mg oral capsule 1 cap(s) orally once a day 16-Feb-15 12:18  lisinopril 40 mg oral tablet 1 tab(s) orally once a day for blood pressure 16-Feb-15 12:18  metFORMIN 500 mg oral  tablet 1 tab(s) orally 2 times a day for diabetes 16-Feb-15 12:18  amLODIPine 10 mg oral tablet 1 tab(s) orally once a day for blood pressure 16-Feb-15 12:18   Allergies:  No Known Allergies:   Social/Family History: Social History: 1-1 1/2 pack a day for 30+ years, social EtOH, admits to crack, disabled for mental issues  Family History: no Stroke, no CAD   Vital Signs: **Vital Signs.:   17-Feb-15 13:23  Vital Signs Type Routine  Temperature Temperature (F) 97.9  Celsius 36.6  Temperature Source oral  Pulse Pulse 74  Respirations Respirations 20  Systolic BP Systolic BP 299  Diastolic BP (mmHg) Diastolic BP (mmHg) 242  Mean BP 136  Pulse Ox % Pulse Ox % 98  Pulse Ox Activity Level  At rest  Oxygen Delivery Room Air/ 21 %   Physical Exam: General: overweight, slightly dischevaled, NAD  HEENT: normocephalic, sclera nonicteric, oropharynx clear  Neck: supple, no JVD, no bruits  Chest: CTA B, no wheezing, good movement  Cardiac: RRR, no murmurs, no edema, 2+ pulses  Extremities: no C/C/E, FROM   Neurologic Exam: Mental Status: alert and oriented x 3, normal language, follows complex commands, mild dysarthria  Cranial Nerves: PERRLA, EOMI, nl VF, L droop, tongue midline, shoulder shrug equal  Motor Exam: 4/5 L, 5/5R, nl tone  Deep Tendon Reflexes: 1+/4 B, downgoing plantars B  Sensory Exam: pinprick, temperature, and vibration intact B  Coordination: mild dysmmetria in proportion to L sided weakness   Lab Results:  LabObservation:  17-Feb-15 14:46   OBSERVATION Reason for Test  Hepatic:  16-Feb-15 11:52   Bilirubin, Total 0.6  Alkaline Phosphatase 72 (45-117 NOTE: New Reference Range 12/25/12)  SGPT (ALT) 46  SGOT (AST) 36  Total Protein, Serum 6.8  Albumin, Serum  3.2  Routine Chem:  16-Feb-15 11:52   B-Type Natriuretic Peptide ([facility])  152 (Result(s) reported on 22 Mar 2013 at 02:15PM.)  Result Comment TROPONIN - RESULTS VERIFIED BY REPEAT TESTING.  -  READ-BACK PROCESS PERFORMED.  - C/KIM GAULT 03-22-13 1245 SJL  Result(s) reported on 22 Mar 2013 at 12:47PM.  17-Feb-15 05:21   Glucose, Serum  164  BUN  22  Creatinine (comp)  1.45  Sodium, Serum  133  Potassium, Serum  3.0  Chloride, Serum 101  CO2, Serum 26  Calcium (Total), Serum  7.8  Anion Gap  6  Osmolality (calc) 273  eGFR (African American) >60  eGFR (Non-African American)  55 (eGFR values <71mL/min/1.73 m2 may be an indication of chronic kidney disease (CKD). Calculated eGFR is useful in patients with stable renal function. The eGFR calculation will not be reliable in acutely ill patients when serum creatinine is changing rapidly. It is not useful in  patients on dialysis. The eGFR calculation may not be applicable to patients at the low and high extremes of body sizes, pregnant women, and vegetarians.)  Urine Drugs:  62-GBT-51 76:16   Tricyclic Antidepressant, Ur Qual (comp) NEGATIVE (Result(s) reported on 23 Mar 2013 at 02:16AM.)  Amphetamines, Urine Qual. NEGATIVE  MDMA, Urine Qual. POSITIVE  Cocaine Metabolite, Urine Qual. POSITIVE  Opiate, Urine qual NEGATIVE  Phencyclidine, Urine Qual. NEGATIVE  Cannabinoid, Urine Qual. NEGATIVE  Barbiturates, Urine Qual. NEGATIVE  Benzodiazepine, Urine Qual. NEGATIVE (----------------- The URINE DRUG SCREEN provides only a preliminary, unconfirmed analytical test result and should not be used for non-medical  purposes.  Clinical consideration and professional judgment should be  applied to any positive drug screen result due to possible interfering substances.  A more specific alternate chemical method must be used in order to obtain a confirmed analytical result.  Gas chromatography/mass spectrometry (GC/MS) is the preferred confirmatory method.)  Methadone, Urine Qual. NEGATIVE  Cardiac:  16-Feb-15 19:37   Troponin I 0.05 (0.00-0.05 0.05 ng/mL or less: NEGATIVE  Repeat testing in 3-6 hrs  if clinically  indicated. >0.05 ng/mL: POTENTIAL  MYOCARDIAL INJURY. Repeat  testing in 3-6 hrs if  clinically indicated. NOTE: An increase or decrease  of 30% or more on serial  testing suggests a  clinically important change)  Routine UA:  17-Feb-15 02:00   Color (UA) Yellow  Clarity (UA) Clear  Glucose (UA) >=500  Bilirubin (UA) Negative  Ketones (UA) Negative  Specific Gravity (UA) 1.013  Blood (UA) Negative  pH (UA) 5.0  Protein (UA) Negative  Nitrite (UA) Negative  Leukocyte Esterase (UA) Negative (Result(s) reported on 23 Mar 2013 at 02:16AM.)  RBC (UA) NONE SEEN  WBC (UA) <1 /HPF  Bacteria (UA) TRACE  Epithelial Cells (UA) <1 /HPF  Mucous (UA) PRESENT (Result(s) reported on 23 Mar 2013 at 02:16AM.)  Routine Hem:  16-Feb-15 11:52   WBC (CBC) 8.9  RBC (CBC) 4.90  Hemoglobin (CBC) 15.4  Hematocrit (CBC) 44.2  Platelet Count (CBC) 179  MCV 90  MCH 31.4  MCHC 34.8  RDW 13.0  Neutrophil % 68.8  Lymphocyte %  17.9  Monocyte % 12.5  Eosinophil % 0.4  Basophil % 0.4  Neutrophil # 6.1  Lymphocyte # 1.6  Monocyte #  1.1  Eosinophil # 0.0  Basophil # 0.0 (Result(s) reported on 22 Mar 2013 at 12:24PM.)   Radiology Results: [place]:    17-Feb-15 14:20, US Carotid Doppler Bilateral  US Carotid Doppler Bilateral   REASON FOR EXAM:    acute stroke  COMMENTS:       PROCEDURE: [place]  - US CAROTID DOPPLER BILATERAL  - Mar 23 2013  2:20PM     CLINICAL DATA:  Acute stroke history hypertension, diabetes, smoker    EXAM:  BILATERAL CAROTID DUPLEX ULTRASOUND    TECHNIQUE:  Pearline Cables scale imaging, color Doppler and duplex ultrasound were  performed of bilateral carotid and vertebral arteries in the neck.    COMPARISON:  09/25/2012  FINDINGS:  Criteria: Quantification of carotid stenosis is based on velocity  parameters that correlate the residual internal carotid diameter  with NASCET-based stenosis levels, using the diameter of the distal  internal carotid lumen as the denominator for  stenosis measurement.    The following velocity measurements were obtained:    RIGHT    ICA:  50/26 cm/sec    CCA:  29/56 cm/sec    SYSTOLIC ICA/CCA RATIO:  0.8  DIASTOLIC ICA/CCA RATIO:  1.7    ECA:  139 cm/sec    LEFT    ICA:  48/10 cm/sec    CCA:  21/30 cm/sec    SYSTOLIC ICA/CCA RATIO:  0.6    DIASTOLIC ICA/CCA RATIO:  0.5    ECA:  143 cm/sec  RIGHT CAROTID ARTERY: Intimal thickening right CCA. No significant  plaque formation. Mildly turbulent flow at areas of tortuosity in  right ICA on color Doppler imaging with spectral broadening on  waveform analysis. No high velocity jets.    RIGHT VERTEBRAL ARTERY:  Patent, antegrade    LEFT CAROTID ARTERY: Intimal thickening throughout left CCA into  left carotid bulb. No significant plaque formation. Tortuous system  with scattered areas of associated turbulent flow. No high velocity  jets.    LEFT VERTEBRAL ARTERY:  Patent, antegrade     IMPRESSION:  No evidence of plaque formation or stenosis.      Electronically Signed    By: Lavonia Dana M.D.    On: 03/23/2013 14:22         Verified By: Burnetta Sabin, M.D.,  MRI:    17-Feb-15 09:45, MRI Brain Without Contrast  MRI Brain Without Contrast   REASON FOR EXAM:    Headache/CVA/TIA  COMMENTS:       PROCEDURE: MR  - MR BRAIN WO CONTRAST  - Mar 23 2013  9:45AM     CLINICAL DATA:  Headache and dizziness. Weakness and slurred speech.    EXAM:  MRI HEAD WITHOUT CONTRAST    TECHNIQUE:  Multiplanar, multiecho pulse sequences of the brain and surrounding  structures were obtained without intravenous contrast.    COMPARISON:  CT 03/22/2013, MRI 09/24/2012  FINDINGS:  Acute infarct involving the right frontal lobe measuring  approximately 2 x 4 cmin size. Small 5 mm area of acute infarct in  the left frontal lobe. No other acute infarct    Chronic left PICA infarct has progressed since the prior study. This  area does not show restricted diffusion. Chronic  microvascular  ischemic change in the white matter. Hypodensity in the posterior  internal capsule right is new since the prior study  but does not  show restricted diffusion and therefore appears to represent chronic  ischemia.    Ventricle size is normal. No shift of the midline structures.  Negative for hemorrhage or mass lesion.  Mucous retention cyst left maxillary sinus. Mild mucosal edema right  maxillary sinus.     IMPRESSION:  Acute infarct right frontal lobe. Smaller of acute infarct left  frontal lobe. This may be due to cerebral emboli    Chronic left PICA infarct. Small chronic lacunar infarction right  internal capsule. Chronic microvascular ischemic change in the white  matter.      Electronically Signed    By: Franchot Gallo M.D.    On: 03/23/2013 10:14     Verified By: Truett Perna, M.D.,  CT:    16-Feb-15 12:29, CT Head Without Contrast  CT Head Without Contrast   REASON FOR EXAM:    headache  COMMENTS:       PROCEDURE: CT  - CT HEAD WITHOUT CONTRAST  - Mar 22 2013 12:29PM     CLINICAL DATA:  Headache, weakness    EXAM:  CT HEAD WITHOUT CONTRAST    TECHNIQUE:  Contiguous axial images were obtained from the base of the skull  through the vertex without contrast.    COMPARISON:  09/24/2012, 09/25/2012  FINDINGS:  Since the prior exam, patchy right frontal subcortical white matter  hypodensity has developed, images 14 through 24. No significant mass  effect or overlying sulcal effacement. Pattern consistent with a  subacute right frontal anterior MCA distribution infarct.    Additionally, there is a wedge-shaped hypodensity in the inferior  left cerebellum compatible with a more remote left cerebellar PICA  territory infarct.    Mild patchy white matter disease noted diffusely.    No acute intracranial hemorrhage, midline shift, herniation, or  hydrocephalus. No extra-axial fluid collection.  Mastoid sinuses clear.     IMPRESSION:  Right  frontal lobe subcortical white matter patchy hypodensity  compatible with ischemia/infarct, suspect subacute.    Left inferior cerebellar hypodensity also compatible with infarct,  appearing more remote.    No acute intracranial hemorrhage.      Electronically Signed    By: Daryll Brod M.D.    On: 03/22/2013 12:44     Verified By: Earl Gala, M.D.,   Radiology Impression: Radiology Impression: MRI of brain personally reviewed by me and shows an acute R frontal moderate infarct with small L frontal infarct   Impression/Recommendations: Recommendations:   previous notes reviewed by me  reviewed by me    R frontal moderate infarct-  moderately symptomatic, etiology is suspected vasospasm from cocaine vs. vasculitis;  could be cardiac source as well HTN-  somewhat controlled DM-  not well controlled Polysubstance abuse-  pt admits and wants to stop CTA of head and neck to look for vasculitis echo pending check ANCA, ANA, TSH, ESR, CRP agree with ASA $Remove'81mg'sWKvOHb$  daily check LDL and adjust for < 70 check Hem A1c and adjust for < 7 needs PT/OT will follow  Electronic Signatures for Addendum Section:  Jamison Neighbor (MD) (Signed Addendum 18-Feb-15 05:49)  no sign of vasculitis on cta and mildly elevated crp.  await echo.  if reg echo is normal, suggest tee.  expect pt to be here one more day.  infarct coukd be cardioembolic in etiology   Electronic Signatures: Jamison Neighbor (MD)  (Signed 17-Feb-15 16:21)  Authored: REFERRING PHYSICIAN, Primary Care Physician, Consult, History of Present Illness, Review  of Systems, PAST MEDICAL/SURGICAL HISTORY, HOME MEDICATIONS, ALLERGIES, Social/Family History, NURSING VITAL SIGNS, Physical Exam-, LAB RESULTS, RADIOLOGY RESULTS, Recommendations   Last Updated: 18-Feb-15 05:49 by Jamison Neighbor (MD)

## 2014-05-28 NOTE — Discharge Summary (Signed)
PATIENT NAME:  [name], [name] MR#:  [id number] DATE OF BIRTH:  10/22/1960  DATE OF ADMISSION:  03/23/2013 DATE OF DISCHARGE:  03/24/2013  PRESENTING COMPLAINT: Headache and left lower extremity weakness.   DISCHARGE DIAGNOSES:  1.  Acute right frontal middle cerebral aneurysm.  2.  Hypertension.  3.  Type 2 diabetes.  4.  Polysubstance abuse, cocaine with MDMA.  5.  Hyperlipidemia.  6.  Morbid obesity.  7.  Bipolar disorder.   CONDITION ON DISCHARGE: Fair.   MEDICATIONS:  1.  Trazodone 50 mg p.o. daily at bedtime.  2.  Fluoxetine 20 mg daily.  3.  Lisinopril 40 mg daily.  4.  Metformin 500 b.i.d.  5.  Amlodipine 10 mg p.o. daily.  6.  Insulin Lantus 55 units at bedtime.  7.  Clonidine 0.2 mg b.i.d.  8.  Aspirin 325 mg p.o. daily.   DISCHARGE DIET: Low-sodium, low-fat, low-cholesterol, carbohydrate-controlled diet.   FOLLOWUP:  1.  Dr. Delight Stare 1 to 2 weeks.  2.  [facility] 2 to 4 weeks.   Neuro consultation with Dr. Valora Corporal.   CT angiography of the head and neck shows multifocal intracranial artery irregularity but  both advanced atherosclerosis and intracranial vasculitis. Hemodynamically significant stenosis of the right MCA anterior 2 division, mid basilar artery, left ICA cavernous segment, and right ICA supraclinoid segment. Tortuous cervical, carotid, and vertebral arteries without stenosis.   TSH within normal limits. Vitamin B12 within normal limits. ESR is 5.   Echo Doppler shows EF of 55% to 60%, severely increased left ventricular posterior wall thickness. Cannot exclude or confirm PFO or ASD on the basis of this study. Cannot exclude cardioembolic source on the basis of this study.   Ultrasound carotid Doppler: No evidence of plaque formation or stenosis.   MRI of the brain shows acute infarct, right frontal lobe. Smaller of acute infarct of the left frontal lobe. This may be due to cerebral emboli. Chronic left PICA infarct. Chronic lacunar infarction  of the right internal capsule.   Urinalysis negative for UTI.   BRIEF SUMMARY OF HOSPITAL COURSE: [name] [name] is a 54 year old obese African American gentleman with history of hypertension, previous stroke, and depression who comes in with left-sided weakness and headache. He was admitted with:  1.  Acute right frontal stroke with small left frontal infarct. Possibility of embolic versus atherosclerotic. The patient was started on aspirin per neurology recommendation. He was not on any antiplatelet agents prior to coming despite him having history of strokes. His statins were continued. Neuro consult with Dr. Valora Corporal was obtained. Transthoracic echo was done. TEE was recommended; however, the patient was in a hurry to leave the hospital and refused to get a TEE done. He was agreeable to get a TEE done as outpatient which has been set up for February 26. CT of the neck was done and results as above were noted. The patient was ambulated well. He did not want any physical therapy assessment as well.  2.  Type 2 diabetes. Metformin and Lantus were continued.  3.  Hypertension. Continue hydralazine, clonidine, and Norvasc. The patient did not have any drop in his blood pressure during the hospital stay.  4.  Depression. Continue Prozac and trazodone.  5.  Polysubstance abuse with urine drug screen positive for cocaine and MDMA. The patient was advised on abstinence.   Hospital stay otherwise remained stable.   CODE STATUS: REMAINED FULL CODE.   TIME SPENT: 40 minutes.   ____________________________ Gus Height  Hulda Humphrey, MD sap:np D: 03/25/2013 16:39:06 ET T: 03/25/2013 22:48:18 ET JOB#: [id number]  cc: Angely Dietz A. Posey Pronto, MD, <Dictator> Ilda Basset MD ELECTRONICALLY SIGNED 04/04/2013 13:37

## 2014-05-28 NOTE — H&P (Signed)
PATIENT NAME:  [name] MeigsRIDE, [name] [name] MR#:  [id number] DATE OF BIRTH:  1960-08-09  DATE OF ADMISSION:  03/22/2013  PRIMARY CARE PHYSICIAN: [facility].   CHIEF COMPLAINT: Headache and left lower extremity weakness.   HISTORY OF PRESENT ILLNESS: This is a [age] year old male who presented to the hospital with a frontal headache that began about 10 hours ago. He describes the headache as a squeezing pain in the front of his head associated with some left lower extremity weakness. He also had multiple episodes of nausea and vomiting prior to coming into the hospital. The patient denied any focal numbness or tingling anywhere. He denied any chest pain, any shortness of breath. The patient came to the ER and underwent a CT scan of his head which was suspicious for a subacute CVA on the right frontal lobe. Hospitalist services were contacted for further treatment and evaluation. Incidentally, the patient was also noted to have elevated troponin, although the patient is currently chest pain-free and hemodynamically stable.   REVIEW OF SYSTEMS: CONSTITUTIONAL: No documented fever. No weight gain. No weight loss.  EYES: No blurry or double vision.  ENT: No tinnitus or postnasal drip. No redness of the oropharynx.  RESPIRATORY: No cough, no wheeze, no hemoptysis, no dyspnea.  CARDIOVASCULAR: No chest pain, no orthopnea, no palpitations, no syncope.  GASTROINTESTINAL: Positive nausea. Positive vomiting. No diarrhea. No abdominal pain. No melena or hematochezia.  GENITOURINARY: No dysuria or hematuria.  ENDOCRINE: No polyuria or nocturia. No heat or cold intolerance.  HEMATOLOGIC: No anemia, no bruising, no bleeding.  INTEGUMENTARY: No rashes. No lesions.  MUSCULOSKELETAL: No arthritis, no swelling, no gout. NEUROLOGIC: No numbness or tingling. No ataxia. No seizure type activity.  PSYCHIATRIC: Positive depression. No anxiety. No ADD.   PAST MEDICAL HISTORY: Consistent with hypertension, depression, previous CVA,  noncompliance, and polysubstance abuse.   ALLERGIES: No known drug allergies.   SOCIAL HISTORY: Used to be a smoker, says he quit only a few days ago. Does smoke about a pack per day and has been smoking for the past 20+ years. Occasional alcohol use. He did do cocaine and marijuana 2 days ago.   FAMILY HISTORY: The patient's mother is deceased, died of complications of diabetes. Father is alive. He has high blood pressure and arthritis.   CURRENT MEDICATIONS:  1.  Amlodipine 10 mg daily. 2.  Clonidine 0.2 mg b.i.d. 3.  Fluoxetine 20 mg daily. 4.  Insulin glargine 55 units at bedtime. 5.  Lisinopril 40 mg daily. 6.  Metformin 500 mg b.i.d. 7.  Trazodone 50 mg at bedtime.   PHYSICAL EXAMINATION: Presently is as follows:  VITAL SIGNS: Temperature 98.2, pulse 78, respirations 20, blood pressure 148/87, and sats 96% on room air.  GENERAL: The patient is a pleasant-appearing male in no apparent distress.  HEENT: Atraumatic, normocephalic. Extraocular muscles are intact. Pupils are equal and reactive to light. Sclerae anicteric. No conjunctival injection. No pharyngeal erythema.  NECK: Supple. There is no jugular venous distention. No bruits, lymphadenopathy, or thyromegaly.  HEART: Regular rate and rhythm. No murmurs. No rubs. No clicks.  LUNGS: Clear to auscultation bilaterally. No rales, no rhonchi, no wheezes.  ABDOMEN: Soft, flat, nontender, nondistended. Has good bowel sounds. No hepatosplenomegaly appreciated.  EXTREMITIES: No evidence of any cyanosis, clubbing, or peripheral edema. Has +2 pedal and radial pulses bilaterally.  NEUROLOGIC: The patient is alert, awake, and oriented x3 with no focal motor or sensory deficits appreciated bilaterally.  SKIN: Moist and warm with no rashes appreciated.  LYMPHATIC:  There is no cervical or axillary lymphadenopathy.   LABORATORY AND DIAGNOSTICS: Serum glucose 318, BUN 21, creatinine 1.6, sodium 130, potassium 3.7, chloride 97, bicarb 29. LFTs  are within normal limits. Troponin 0.06. White cell count 8.9, hemoglobin 15.4, hematocrit 44.2, platelet count 179.   The patient had a CT of the head done which showed right frontal lobe subcortical white matter patchy hypodensity compatible with ischemic infarct, suspect subacute, left inferior cerebellar hypodensity, also compatible with infarct, appearing more remote. No acute intracranial hemorrhage.   ASSESSMENT AND PLAN: This is a 54 year old male with a history of hypertension, medical noncompliance, history of cerebrovascular accident, depression, polysubstance abuse, and diabetes who presents to the hospital due to a headache and left lower extremity weakness and a CT scan showing a possible subacute cerebrovascular accident. The patient also is incidentally noted to have an elevated troponin of 0.06.  1.  Headache/suspected cerebrovascular accident. The patient does have significant risk factors given his hypertension, tobacco abuse, polysubstance abuse, and medical noncompliance. His CT scan does show a subacute CVA, although his neurological exam seems to be nonfocal and intact. At this point, I will continue his aspirin. I will check a MRI of his brain, check a lipid profile to see if he needs to be started on a statin, and follow him clinically.  2.  Elevated troponin. This is likely in the setting of poor renal clearance and from acute renal failure. The patient acutely has no chest pain. I will observe him on off unit telemetry, follow serial cardiac markers. The patient did do cocaine about 2 days ago. I will continue aspirin for now. Check a lipid profile.  3.  Depression. No homicidal or suicidal ideations. Continue Prozac and trazodone.  4.  Hypertension. Continue clonidine, Norvasc, and lisinopril.  5.  Acute renal failure, likely secondary to hyperglycemia and dehydration. Will hydrate with IV fluids and monitor. 6.  Polysubstance abuse. I will check a urine drug screen. 7.   Diabetes. Continue with his Lantus and sliding scale insulin and hold metformin for now given his acute renal failure.   The patient is a FULL code.   TIME SPENT: 50 minutes. ____________________________ Rolly Pancake. Cherlynn Kaiser, MD vjs:sb D: 03/22/2013 15:16:00 ET T: 03/22/2013 15:34:59 ET JOB#: [id number]  cc: Rolly Pancake. Cherlynn Kaiser, MD, <Dictator> Houston Siren MD ELECTRONICALLY SIGNED 03/22/2013 19:07

## 2014-07-20 ENCOUNTER — Encounter: Payer: Self-pay | Admitting: Emergency Medicine

## 2014-07-20 ENCOUNTER — Emergency Department: Payer: Medicaid Other

## 2014-07-20 ENCOUNTER — Observation Stay
Admission: EM | Admit: 2014-07-20 | Discharge: 2014-07-22 | Disposition: A | Discharge status: Home / Self Care | Payer: Medicaid Other | Attending: Specialist | Admitting: Specialist

## 2014-07-20 DIAGNOSIS — F319 Bipolar disorder, unspecified: Secondary | ICD-10-CM | POA: Diagnosis not present

## 2014-07-20 DIAGNOSIS — Z6835 Body mass index (BMI) 35.0-35.9, adult: Secondary | ICD-10-CM | POA: Insufficient documentation

## 2014-07-20 DIAGNOSIS — R42 Dizziness and giddiness: Secondary | ICD-10-CM | POA: Diagnosis not present

## 2014-07-20 DIAGNOSIS — I517 Cardiomegaly: Secondary | ICD-10-CM | POA: Insufficient documentation

## 2014-07-20 DIAGNOSIS — I319 Disease of pericardium, unspecified: Secondary | ICD-10-CM | POA: Insufficient documentation

## 2014-07-20 DIAGNOSIS — F209 Schizophrenia, unspecified: Secondary | ICD-10-CM | POA: Insufficient documentation

## 2014-07-20 DIAGNOSIS — R200 Anesthesia of skin: Secondary | ICD-10-CM | POA: Diagnosis not present

## 2014-07-20 DIAGNOSIS — Z9114 Patient's other noncompliance with medication regimen: Secondary | ICD-10-CM | POA: Insufficient documentation

## 2014-07-20 DIAGNOSIS — M79602 Pain in left arm: Secondary | ICD-10-CM | POA: Insufficient documentation

## 2014-07-20 DIAGNOSIS — R0602 Shortness of breath: Secondary | ICD-10-CM

## 2014-07-20 DIAGNOSIS — E1165 Type 2 diabetes mellitus with hyperglycemia: Secondary | ICD-10-CM | POA: Diagnosis not present

## 2014-07-20 DIAGNOSIS — R45851 Suicidal ideations: Secondary | ICD-10-CM | POA: Diagnosis not present

## 2014-07-20 DIAGNOSIS — H5442 Blindness, left eye, normal vision right eye: Secondary | ICD-10-CM | POA: Diagnosis not present

## 2014-07-20 DIAGNOSIS — R202 Paresthesia of skin: Secondary | ICD-10-CM | POA: Diagnosis not present

## 2014-07-20 DIAGNOSIS — I2510 Atherosclerotic heart disease of native coronary artery without angina pectoris: Secondary | ICD-10-CM | POA: Insufficient documentation

## 2014-07-20 DIAGNOSIS — F1210 Cannabis abuse, uncomplicated: Secondary | ICD-10-CM | POA: Diagnosis not present

## 2014-07-20 DIAGNOSIS — Z8673 Personal history of transient ischemic attack (TIA), and cerebral infarction without residual deficits: Secondary | ICD-10-CM | POA: Diagnosis not present

## 2014-07-20 DIAGNOSIS — H538 Other visual disturbances: Secondary | ICD-10-CM | POA: Insufficient documentation

## 2014-07-20 DIAGNOSIS — Z87891 Personal history of nicotine dependence: Secondary | ICD-10-CM | POA: Insufficient documentation

## 2014-07-20 DIAGNOSIS — F329 Major depressive disorder, single episode, unspecified: Secondary | ICD-10-CM | POA: Diagnosis not present

## 2014-07-20 DIAGNOSIS — E669 Obesity, unspecified: Secondary | ICD-10-CM | POA: Insufficient documentation

## 2014-07-20 DIAGNOSIS — Z79899 Other long term (current) drug therapy: Secondary | ICD-10-CM | POA: Insufficient documentation

## 2014-07-20 DIAGNOSIS — F17210 Nicotine dependence, cigarettes, uncomplicated: Secondary | ICD-10-CM | POA: Diagnosis not present

## 2014-07-20 DIAGNOSIS — R440 Auditory hallucinations: Secondary | ICD-10-CM | POA: Diagnosis not present

## 2014-07-20 DIAGNOSIS — R519 Headache, unspecified: Secondary | ICD-10-CM

## 2014-07-20 DIAGNOSIS — K219 Gastro-esophageal reflux disease without esophagitis: Secondary | ICD-10-CM | POA: Insufficient documentation

## 2014-07-20 DIAGNOSIS — Z7982 Long term (current) use of aspirin: Secondary | ICD-10-CM | POA: Diagnosis not present

## 2014-07-20 DIAGNOSIS — Z794 Long term (current) use of insulin: Secondary | ICD-10-CM | POA: Insufficient documentation

## 2014-07-20 DIAGNOSIS — Z9119 Patient's noncompliance with other medical treatment and regimen: Secondary | ICD-10-CM | POA: Diagnosis not present

## 2014-07-20 DIAGNOSIS — F1410 Cocaine abuse, uncomplicated: Secondary | ICD-10-CM | POA: Insufficient documentation

## 2014-07-20 DIAGNOSIS — I674 Hypertensive encephalopathy: Principal | ICD-10-CM | POA: Insufficient documentation

## 2014-07-20 DIAGNOSIS — E1140 Type 2 diabetes mellitus with diabetic neuropathy, unspecified: Secondary | ICD-10-CM | POA: Diagnosis not present

## 2014-07-20 DIAGNOSIS — I160 Hypertensive urgency: Secondary | ICD-10-CM

## 2014-07-20 DIAGNOSIS — R51 Headache: Secondary | ICD-10-CM | POA: Insufficient documentation

## 2014-07-20 DIAGNOSIS — E119 Type 2 diabetes mellitus without complications: Secondary | ICD-10-CM | POA: Insufficient documentation

## 2014-07-20 DIAGNOSIS — F419 Anxiety disorder, unspecified: Secondary | ICD-10-CM | POA: Insufficient documentation

## 2014-07-20 DIAGNOSIS — I10 Essential (primary) hypertension: Secondary | ICD-10-CM | POA: Diagnosis present

## 2014-07-20 DIAGNOSIS — G4700 Insomnia, unspecified: Secondary | ICD-10-CM | POA: Diagnosis not present

## 2014-07-20 DIAGNOSIS — Z7902 Long term (current) use of antithrombotics/antiplatelets: Secondary | ICD-10-CM | POA: Insufficient documentation

## 2014-07-20 DIAGNOSIS — I119 Hypertensive heart disease without heart failure: Secondary | ICD-10-CM | POA: Diagnosis not present

## 2014-07-20 DIAGNOSIS — R748 Abnormal levels of other serum enzymes: Secondary | ICD-10-CM | POA: Diagnosis not present

## 2014-07-20 DIAGNOSIS — R208 Other disturbances of skin sensation: Secondary | ICD-10-CM | POA: Diagnosis present

## 2014-07-20 LAB — CBC
HCT: 46 % (ref 40.0–52.0)
Hemoglobin: 15.6 g/dL (ref 13.0–18.0)
MCH: 31.3 pg (ref 26.0–34.0)
MCHC: 34 g/dL (ref 32.0–36.0)
MCV: 92.2 fL (ref 80.0–100.0)
PLATELETS: 185 10*3/uL (ref 150–440)
RBC: 4.98 MIL/uL (ref 4.40–5.90)
RDW: 13.4 % (ref 11.5–14.5)
WBC: 11.2 10*3/uL — AB (ref 3.8–10.6)

## 2014-07-20 LAB — COMPREHENSIVE METABOLIC PANEL
ALK PHOS: 58 U/L (ref 38–126)
ALT: 12 U/L — ABNORMAL LOW (ref 17–63)
AST: 18 U/L (ref 15–41)
Albumin: 4 g/dL (ref 3.5–5.0)
Anion gap: 3 — ABNORMAL LOW (ref 5–15)
BUN: 20 mg/dL (ref 6–20)
CALCIUM: 8.7 mg/dL — AB (ref 8.9–10.3)
CO2: 25 mmol/L (ref 22–32)
Chloride: 109 mmol/L (ref 101–111)
Creatinine, Ser: 1.26 mg/dL — ABNORMAL HIGH (ref 0.61–1.24)
GFR calc Af Amer: >60 mL/min (ref >60)
GLUCOSE: 161 mg/dL — AB (ref 65–99)
Potassium: 3.8 mmol/L (ref 3.5–5.1)
Sodium: 137 mmol/L (ref 135–145)
Total Bilirubin: 0.9 mg/dL (ref 0.3–1.2)
Total Protein: 7.1 g/dL (ref 6.5–8.1)

## 2014-07-20 LAB — DIFFERENTIAL
BASOS ABS: 0.1 10*3/uL (ref 0–0.1)
BASOS PCT: 1 %
EOS ABS: 0.4 10*3/uL (ref 0–0.7)
EOS PCT: 4 %
Lymphocytes Relative: 23 %
Lymphs Abs: 2.6 10*3/uL (ref 1.0–3.6)
Monocytes Absolute: 1 10*3/uL (ref 0.2–1.0)
Monocytes Relative: 9 %
Neutro Abs: 7.1 10*3/uL — ABNORMAL HIGH (ref 1.4–6.5)
Neutrophils Relative %: 63 %

## 2014-07-20 LAB — URINE DRUG SCREEN, QUALITATIVE (ARMC ONLY)
Amphetamines, Ur Screen: NOT DETECTED
BARBITURATES, UR SCREEN: NOT DETECTED
Benzodiazepine, Ur Scrn: NOT DETECTED
CANNABINOID 50 NG, UR ~~LOC~~: NOT DETECTED
COCAINE METABOLITE, UR ~~LOC~~: POSITIVE — AB
MDMA (ECSTASY) UR SCREEN: NOT DETECTED
Methadone Scn, Ur: NOT DETECTED
OPIATE, UR SCREEN: NOT DETECTED
Phencyclidine (PCP) Ur S: NOT DETECTED
TRICYCLIC, UR SCREEN: NOT DETECTED

## 2014-07-20 LAB — TROPONIN I
Troponin I: 0.05 ng/mL — ABNORMAL HIGH (ref <0.031)
Troponin I: 0.04 ng/mL — ABNORMAL HIGH (ref <0.031)
Troponin I: 0.05 ng/mL — ABNORMAL HIGH (ref <0.031)

## 2014-07-20 LAB — PROTIME-INR
INR: 1.04
PROTHROMBIN TIME: 13.8 s (ref 11.4–15.0)

## 2014-07-20 LAB — APTT: APTT: 29 s (ref 24–36)

## 2014-07-20 MED ORDER — SODIUM CHLORIDE 0.9 % IJ SOLN
3.0000 mL | Freq: Two times a day (BID) | INTRAMUSCULAR | Status: DC
  Administered 2014-07-20 – 2014-07-22 (×5): 3 mL via INTRAVENOUS

## 2014-07-20 MED ORDER — CLONIDINE HCL 0.1 MG PO TABS
0.2000 mg | ORAL_TABLET | Freq: Two times a day (BID) | ORAL | Status: DC
  Administered 2014-07-20 – 2014-07-22 (×4): 0.2 mg via ORAL
  Filled 2014-07-20 (×4): qty 2

## 2014-07-20 MED ORDER — PREGABALIN 50 MG PO CAPS
50.0000 mg | ORAL_CAPSULE | Freq: Every day | ORAL | Status: DC
  Administered 2014-07-20 – 2014-07-22 (×3): 50 mg via ORAL
  Filled 2014-07-20 (×3): qty 1

## 2014-07-20 MED ORDER — BUSPIRONE HCL 10 MG PO TABS
10.0000 mg | ORAL_TABLET | Freq: Two times a day (BID) | ORAL | Status: DC
  Administered 2014-07-20 – 2014-07-22 (×4): 10 mg via ORAL
  Filled 2014-07-20 (×6): qty 1

## 2014-07-20 MED ORDER — TRAZODONE HCL 50 MG PO TABS
150.0000 mg | ORAL_TABLET | Freq: Every day | ORAL | Status: DC
  Administered 2014-07-20 – 2014-07-21 (×2): 150 mg via ORAL
  Filled 2014-07-20 (×2): qty 1

## 2014-07-20 MED ORDER — LABETALOL HCL 5 MG/ML IV SOLN
INTRAVENOUS | Status: AC
  Filled 2014-07-20: qty 4

## 2014-07-20 MED ORDER — LABETALOL HCL 5 MG/ML IV SOLN
INTRAVENOUS | Status: AC
  Filled 2014-07-20: qty 8

## 2014-07-20 MED ORDER — AMLODIPINE BESYLATE 10 MG PO TABS
10.0000 mg | ORAL_TABLET | Freq: Every day | ORAL | Status: DC
  Administered 2014-07-20 – 2014-07-22 (×3): 10 mg via ORAL
  Filled 2014-07-20 (×3): qty 1

## 2014-07-20 MED ORDER — METFORMIN HCL 500 MG PO TABS
500.0000 mg | ORAL_TABLET | Freq: Two times a day (BID) | ORAL | Status: DC
  Administered 2014-07-20 – 2014-07-22 (×4): 500 mg via ORAL
  Filled 2014-07-20 (×4): qty 1

## 2014-07-20 MED ORDER — LABETALOL HCL 5 MG/ML IV SOLN
40.0000 mg | Freq: Once | INTRAVENOUS | Status: AC
  Administered 2014-07-20: 40 mg via INTRAVENOUS

## 2014-07-20 MED ORDER — ASPIRIN EC 81 MG PO TBEC
81.0000 mg | DELAYED_RELEASE_TABLET | Freq: Every day | ORAL | Status: DC
  Administered 2014-07-20 – 2014-07-22 (×3): 81 mg via ORAL
  Filled 2014-07-20 (×3): qty 1

## 2014-07-20 MED ORDER — PANTOPRAZOLE SODIUM 40 MG PO TBEC
40.0000 mg | DELAYED_RELEASE_TABLET | Freq: Every day | ORAL | Status: DC
  Administered 2014-07-20 – 2014-07-22 (×3): 40 mg via ORAL
  Filled 2014-07-20 (×3): qty 1

## 2014-07-20 MED ORDER — LISINOPRIL 20 MG PO TABS
40.0000 mg | ORAL_TABLET | Freq: Every day | ORAL | Status: DC
  Administered 2014-07-20 – 2014-07-22 (×3): 40 mg via ORAL
  Filled 2014-07-20 (×3): qty 2

## 2014-07-20 MED ORDER — LABETALOL HCL 5 MG/ML IV SOLN
20.0000 mg | Freq: Once | INTRAVENOUS | Status: AC
Start: 2014-07-20 — End: 2014-07-20
  Administered 2014-07-20: 20 mg via INTRAVENOUS

## 2014-07-20 MED ORDER — ENOXAPARIN SODIUM 40 MG/0.4ML ~~LOC~~ SOLN
40.0000 mg | SUBCUTANEOUS | Status: DC
  Administered 2014-07-20 – 2014-07-21 (×2): 40 mg via SUBCUTANEOUS
  Filled 2014-07-20 (×3): qty 0.4

## 2014-07-20 MED ORDER — ACETAMINOPHEN 325 MG PO TABS
650.0000 mg | ORAL_TABLET | Freq: Four times a day (QID) | ORAL | Status: DC | PRN
  Administered 2014-07-21: 650 mg via ORAL
  Filled 2014-07-20: qty 2

## 2014-07-20 MED ORDER — BUPROPION HCL ER (XL) 150 MG PO TB24
150.0000 mg | ORAL_TABLET | Freq: Every day | ORAL | Status: DC
  Administered 2014-07-20 – 2014-07-22 (×3): 150 mg via ORAL
  Filled 2014-07-20 (×3): qty 1

## 2014-07-20 MED ORDER — CLONIDINE HCL 0.1 MG PO TABS
ORAL_TABLET | ORAL | Status: AC
  Filled 2014-07-20: qty 2

## 2014-07-20 MED ORDER — CLONIDINE HCL 0.1 MG PO TABS: 0.1000 mg | ORAL_TABLET | ORAL | Status: DC | PRN

## 2014-07-20 MED ORDER — OXYCODONE HCL 5 MG PO TABS
5.0000 mg | ORAL_TABLET | ORAL | Status: DC | PRN
  Administered 2014-07-22: 5 mg via ORAL
  Filled 2014-07-20: qty 1

## 2014-07-20 MED ORDER — ACETAMINOPHEN 650 MG RE SUPP: 650.0000 mg | Freq: Four times a day (QID) | RECTAL | Status: DC | PRN

## 2014-07-20 MED ORDER — LABETALOL HCL 5 MG/ML IV SOLN: 80.0000 mg | Freq: Once | INTRAVENOUS | Status: DC

## 2014-07-20 MED ORDER — CHLORTHALIDONE 25 MG PO TABS
25.0000 mg | ORAL_TABLET | Freq: Every day | ORAL | Status: DC
  Administered 2014-07-20 – 2014-07-22 (×3): 25 mg via ORAL
  Filled 2014-07-20 (×4): qty 1

## 2014-07-20 MED ORDER — NICOTINE 14 MG/24HR TD PT24
14.0000 mg | MEDICATED_PATCH | Freq: Every day | TRANSDERMAL | Status: DC
  Administered 2014-07-21: 14 mg via TRANSDERMAL
  Filled 2014-07-20 (×3): qty 1

## 2014-07-20 MED ORDER — CLOPIDOGREL BISULFATE 75 MG PO TABS
75.0000 mg | ORAL_TABLET | Freq: Two times a day (BID) | ORAL | Status: DC
  Administered 2014-07-20 – 2014-07-21 (×2): 75 mg via ORAL
  Filled 2014-07-20 (×2): qty 1

## 2014-07-20 MED ORDER — CLONIDINE HCL 0.1 MG PO TABS
0.2000 mg | ORAL_TABLET | Freq: Once | ORAL | Status: AC
  Administered 2014-07-20: 0.2 mg via ORAL

## 2014-07-20 NOTE — H&P (Addendum)
[facility] [place] [facility]   PATIENT NAME: [name] [name]    MR#:  [id number]  DATE OF BIRTH:  [birth year]  DATE OF ADMISSION:  07/20/2014  PRIMARY CARE PHYSICIAN: [facility]  REQUESTING/REFERRING PHYSICIAN: Governor Rooks  CHIEF COMPLAINT:   Chief Complaint  Patient presents with  . Headache  . Numbness    HISTORY OF PRESENT ILLNESS:  Joshuwa Vecchio  is a 54 y.o. male with a known history of hypertension diabetes and congestive heart failure. He presents to the ER with headache and dizziness and left arm pain and left hand tingling. He complains of blurry vision but he is blind in the left eye. He states he ate Spam last night. He states he has been taking his medication. He feels to her herbal like a truck hit him. In the ER his blood pressure was very elevated on presentation. After 3 doses of IV labetalol and 1 dose of oral clonidine blood pressure did come down to 149/94. Hospitalist services were contacted for further evaluation. Patient also complains of a little neck discomfort.  PAST MEDICAL HISTORY:   Past Medical History  Diagnosis Date  . Hypertension   . Diabetes mellitus   . GERD (gastroesophageal reflux disease)   . Pericarditis   . Polysubstance abuse     cocaine, marijuana  . Auditory hallucinations   . Depression   . Hx of medication noncompliance   . History of suicide attempt   . Stroke February 2015    Right MCA aneurysm; head MRI - acute infarct of the right frontal lobe. Smaller acute infarct of the left frontal lobe. Thought to be due to cerebral emboli. Left PICA infarct. Small chronic lacunar infarct right internal capsule. Chronic microvascular ischemic change with white matter change.  . Coronary artery disease     No reported catheterization. Negative Myoview stress test and 2013.  Marland Kitchen Syncope and collapse   . Bipolar disorder   . Obesity, Class II, BMI 35-39.9, with comorbidity   . 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  . Aortic stenosis, mild February 2015    PAST SURGICAL HISTORY:   Past Surgical History  Procedure Laterality Date  . Pericardial fluid drainage    . Transthoracic echocardiogram  03/23/2013    EF 5560%. Elevated left atrial pressure. Severe concentric LVH. Mild aortic stenosis. -- Appearance of hypertensive hypertrophic heart disease.  No clear cardioembolic source  . Nm myoview ltd  July 2013    No ischemia or infarct. There is inferior wall defec suggestive of diaphragmatic attenuation. Mild LV dilation at to be due to hypertensive heart disease. EF 55%. Poor exercise tolerance.  . Carotid dopplers:  03/23/2013    Interval thickening of bilateral common carotid no significant plaque formation. Tortuosity but no significant stenosis. Patent bilateral vertebral  arteries    SOCIAL HISTORY:   History  Substance Use Topics  . Smoking status: Current Every Day Smoker -- 0.33 packs/day for 15 years    Types: Cigarettes  . Smokeless tobacco: Not on file  . Alcohol Use: Yes     Comment: occasionally; reports 1 pint of vodka per month at paydaysocially    FAMILY HISTORY:   Family History  Problem Relation Age of Onset  . Stroke Mother   . Diabetes type II Mother   . Hypertension Mother   . Hypertension Father   . Arthritis Father   . Arthritis Sister   .  Diabetes type II Sister   . Hypertension Sister     DRUG ALLERGIES:  No Known Allergies  REVIEW OF SYSTEMS:  CONSTITUTIONAL: No fever, positive for chills and sweats. Positive for fatigue. Positive for weight loss 80 pounds over 6 months EYES: Positive for blurred vision. 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 No sore throat. Positive for dizziness RESPIRATORY: No cough, some shortness of breath. No wheezing or hemoptysis.  CARDIOVASCULAR: No chest pain, orthopnea, edema.  GASTROINTESTINAL:  Some nausea and vomiting, no diarrhea, positive for abdominal pain left upper quadrant. History of constipation. No blood in bowel movements. GENITOURINARY: No dysuria, hematuria.  ENDOCRINE: No polyuria, nocturia,  HEMATOLOGY: No anemia, easy bruising or bleeding SKIN: No rash or lesion. MUSCULOSKELETAL:  Positive for shoulder pain. NEUROLOGIC: No tingling, numbness, weakness.  PSYCHIATRY: History of schizophrenia and bipolar disorder.  MEDICATIONS AT HOME:   Prior to Admission medications   Medication Sig Start Date End Date Taking? Authorizing Provider  amLODipine (NORVASC) 10 MG tablet Take 10 mg by mouth daily.   Yes Historical Provider, MD  buPROPion (WELLBUTRIN XL) 150 MG 24 hr tablet Take 150 mg by mouth daily.   Yes Historical Provider, MD  busPIRone (BUSPAR) 10 MG tablet Take 10 mg by mouth 2 (two) times daily.   Yes Historical Provider, MD  cloNIDine (CATAPRES) 0.2 MG tablet Take 0.2 mg by mouth 2 (two) times daily.    Yes Sanjuana Kava, NP  clopidogrel (PLAVIX) 75 MG tablet Take 75 mg by mouth 2 (two) times daily.    Yes Historical Provider, MD  Ibuprofen-Diphenhydramine Cit (ADVIL PM) 200-38 MG TABS Take 1 tablet by mouth at bedtime as needed (for sleep).   Yes Historical Provider, MD  lisinopril (PRINIVIL,ZESTRIL) 40 MG tablet Take 40 mg by mouth daily.   Yes Historical Provider, MD  metFORMIN (GLUCOPHAGE) 500 MG tablet Take 500 mg by mouth 2 (two) times daily.   Yes Historical Provider, MD  pantoprazole (PROTONIX) 40 MG tablet Take 40 mg by mouth daily.   Yes Historical Provider, MD  pregabalin (LYRICA) 50 MG capsule Take 50 mg by mouth daily.   Yes Historical Provider, MD  traZODone (DESYREL) 150 MG tablet Take 150 mg by mouth at bedtime.   Yes Historical Provider, MD  busPIRone (BUSPAR) 5 MG tablet Take 1 tablet (5 mg total) by mouth 2 (two) times daily. Patient not taking: Reported on 07/20/2014 01/06/13   Gwenyth Bender, NP  chlorthalidone (HYGROTON) 25 MG tablet Take 1  tablet (25 mg total) by mouth daily. Patient not taking: Reported on 07/20/2014 04/07/13   Marykay Lex, MD      VITAL SIGNS:  Blood pressure 149/94, pulse 62, temperature 98 F (36.7 C), resp. rate 20, height  (1.803 m), weight 113.399 kg (250 lb), SpO2 97 %.  PHYSICAL EXAMINATION:  GENERAL:  54 y.o.-year-old patient lying in the bed with no acute distress.  EYES: Pupils equal, round, reactive to light and accommodation. No scleral icterus. Extraocular muscles intact.  HEENT: Head atraumatic, normocephalic. Oropharynx and nasopharynx clear. Tympanic membrane no erythema or exudates seen. NECK:  Supple, no jugular venous distention. No thyroid enlargement, no tenderness.  LUNGS: Normal breath sounds bilaterally, no wheezing, rales,rhonchi or crepitation. No use of accessory muscles of respiration.  CARDIOVASCULAR: S1, S2 normal. No murmurs, rubs, or gallops.  ABDOMEN: Soft, nontender, nondistended. Bowel sounds present. No organomegaly or mass.  EXTREMITIES: No pedal  edema, cyanosis, or clubbing.  NEUROLOGIC: Cranial nerves II through XII are intact. Muscle strength 5/5 in all extremities. Sensation intact grossly intact to light touch upper and lower extremities. Patient states that he has some numbness in all fingers of the left hand. Gait not checked.  PSYCHIATRIC: The patient is alert and oriented x 3.  SKIN: No rash, lesion, or ulcer.   LABORATORY PANEL:   CBC  Recent Labs Lab 07/20/14 1029  WBC 11.2*  HGB 15.6  HCT 46.0  PLT 185   Chemistries   Recent Labs Lab 07/20/14 1029  NA 137  K 3.8  CL 109  CO2 25  GLUCOSE 161*  BUN 20  CREATININE 1.26*  CALCIUM 8.7*  AST 18  ALT 12*  ALKPHOS 58  BILITOT 0.9   Cardiac Enzymes  Recent Labs Lab 07/20/14 1029  TROPONINI 0.05*    RADIOLOGY:  Ct Head Wo Contrast  07/20/2014   CLINICAL DATA:  Left frontal headache, left arm numbness and tingling, which started this morning. History of stroke in 2015.  EXAM:  CT HEAD WITHOUT CONTRAST  TECHNIQUE: Contiguous axial images were obtained from the base of the skull through the vertex without intravenous contrast.  COMPARISON:  None.  FINDINGS: There is no evidence of acute intracranial hemorrhage, mass lesion, brain edema or extra-axial fluid collections. Areas of encephalomalacia from prior left PICA infarct, and more subtle changes in the right frontal lobe are again seen. The ventricles and cisterna are symmetric and patent.  Evaluation of the osseous structures demonstrates no osseous lesions. Patchy polypoid mucosal thickening is seen within the paranasal sinuses, left more than right.  IMPRESSION: No evidence of acute infarction or intracranial hemorrhage by CT.  Chronic supra and infratentorial encephalomalacia from prior infarctions.  If patient's symptoms persist, MRI of the brain may be considered.   Electronically Signed   By: Ted Mcalpine M.D.   On: 07/20/2014 11:42    EKG:   Normal sinus rhythm. Biatrial enlargement. Left axis deviation area Q waves septally. Flipped T waves laterally.  IMPRESSION AND PLAN:   1. Hypertensive encephalopathy. I will continue his usual medications. His last blood pressure reading came down to 149/94. Likely this is noncompliance with medication even though the patient states that he's been taking these medications. Reassess tomorrow for imaging studies. I will hold off on imaging at this time. Send a urine toxicology. 2. Elevated troponin- this is likely demand ischemia from hypertensive encephalopathy. I will prescribe aspirin and continue his usual blood pressure medications. Monitor on off unit telemetry and get serial cardiac enzymes. 3. Diabetes mellitus type 2 controlled- continue usual medications. 4. History of stroke (with previous right-sided weakness and slurred speech which has resolved)- the patient is on Plavix. 5. History of schizophrenia and bipolar disorder- does not seem to be on any medications  for schizophrenia. Continue his medications that are listed in the computer here. 6. Tobacco abuse smoking cessation counseling done 3 minutes by me.  All the records are reviewed and case discussed with ED provider. Management plans discussed with the patient, and he is in agreement.  CODE STATUS: Full code  TOTAL TIME TAKING CARE OF THIS PATIENT: 55 minutes.    Alford Highland M.D on 07/20/2014 at 3:40 PM  Between 7am to 6pm - Pager - [phone]  After 6pm call admission pager [phone]  [facility]  Office  [phone]  CC: Primary care physician; [facility]

## 2014-07-20 NOTE — ED Provider Notes (Signed)
[facility] Emergency Department Provider Note   ____________________________________________  Time seen: On arrival I have reviewed the triage vital signs and the triage nursing note.  HISTORY  Chief Complaint Headache and Numbness   Historian Patient  HPI [name] [name]. is a 54 y.o. male who is complaining of headache he noticed this morning upon waking. He thinks it might be due to eating bad meat. He is complaining of left arm tingling which is similar to when he had a previous stroke. He has missed his hypertension medications. He's had a little bit of chest pressure.   Past Medical History  Diagnosis Date  . Hypertension   . Diabetes mellitus   . GERD (gastroesophageal reflux disease)   . Pericarditis   . Polysubstance abuse     cocaine, marijuana  . Auditory hallucinations   . Depression   . Hx of medication noncompliance   . History of suicide attempt   . Stroke February 2015    Right MCA aneurysm; head MRI - acute infarct of the right frontal lobe. Smaller acute infarct of the left frontal lobe. Thought to be due to cerebral emboli. Left PICA infarct. Small chronic lacunar infarct right internal capsule. Chronic microvascular ischemic change with white matter change.  . Coronary artery disease     No reported catheterization. Negative Myoview stress test and 2013.  Marland Kitchen Syncope and collapse   . Bipolar disorder   . Obesity, Class II, BMI 35-39.9, with comorbidity   . Hypertensive hypertrophic cardiomyopathy February 2015    Severe concentric LVH on echocardiogram.  . Aortic stenosis, mild February 2015    Patient Active Problem List   Diagnosis Date Noted  . CVA (cerebral vascular accident) 05/20/2013  . LVH (left ventricular hypertrophy) 05/20/2013  . Obesity (BMI 30-39.9) 04/10/2013  . Obesity, Class II, BMI 35-39.9, with comorbidity   . Hypertensive hypertrophic cardiomyopathy 03/07/2013  . Aortic stenosis, mild 03/07/2013  .  Hypertension 01/06/2013  . Hypertensive urgency 01/05/2013  . DM (diabetes mellitus), type 2, uncontrolled 01/05/2013  . Anxiety 01/12/2012  . Depression with suicidal ideation 01/09/2012    Class: Acute  . Cocaine abuse with cocaine-induced anxiety disorder 01/09/2012    Class: Acute  . Hypertensive encephalopathy 11/07/2010  . Polysubstance abuse 11/07/2010  . DM 01/25/2009  . PSYCHIATRIC DISORDER 01/25/2009  . Essential hypertension, malignant 01/25/2009  . PERICARDIAL TAMPONADE 01/25/2009  . INSOMNIA 01/25/2009  . Shortness of breath 01/25/2009  . EPIGASTRIC PAIN 01/25/2009    Past Surgical History  Procedure Laterality Date  . Pericardial fluid drainage    . Transthoracic echocardiogram  03/23/2013    EF 5560%. Elevated left atrial pressure. Severe concentric LVH. Mild aortic stenosis. -- Appearance of hypertensive hypertrophic heart disease.  No clear cardioembolic source  . Nm myoview ltd  July 2013    No ischemia or infarct. There is inferior wall defec suggestive of diaphragmatic attenuation. Mild LV dilation at to be due to hypertensive heart disease. EF 55%. Poor exercise tolerance.  . Carotid dopplers:  03/23/2013    Interval thickening of bilateral common carotid no significant plaque formation. Tortuosity but no significant stenosis. Patent bilateral vertebral  arteries    Current Outpatient Rx  Name  Route  Sig  Dispense  Refill  . amLODipine (NORVASC) 10 MG tablet   Oral   Take 10 mg by mouth daily.         Marland Kitchen aspirin (BAYER ASPIRIN) 81 MG tablet   Oral   Take  2 tablets (162 mg total) by mouth daily.   30 tablet   1   . buPROPion (WELLBUTRIN XL) 300 MG 24 hr tablet   Oral   Take 300 mg by mouth every morning.         . busPIRone (BUSPAR) 5 MG tablet   Oral   Take 1 tablet (5 mg total) by mouth 2 (two) times daily.   30 tablet   0   . chlorthalidone (HYGROTON) 25 MG tablet   Oral   Take 1 tablet (25 mg total) by mouth daily.   90 tablet   3    . cloNIDine (CATAPRES) 0.2 MG tablet   Oral   Take 0.2 mg by mouth 2 (two) times daily. For Hypertension         . clopidogrel (PLAVIX) 75 MG tablet   Oral   Take 75 mg by mouth once.         . insulin glargine (LANTUS) 100 UNIT/ML injection   Subcutaneous   Inject 55 Units into the skin at bedtime. For high blood sugar control         . isosorbide mononitrate (IMDUR) 30 MG 24 hr tablet   Oral   Take 90 mg by mouth daily.         Marland Kitchen lisinopril (PRINIVIL,ZESTRIL) 40 MG tablet   Oral   Take 40 mg by mouth daily.         . pantoprazole (PROTONIX) 40 MG tablet   Oral   Take 40 mg by mouth daily.         . traZODone (DESYREL) 150 MG tablet   Oral   Take 150 mg by mouth at bedtime.           Allergies No known allergies  Family History  Problem Relation Age of Onset  . Stroke Mother   . Diabetes type II Mother   . Hypertension Mother   . Hypertension Father   . Arthritis Father   . Arthritis Sister   . Diabetes type II Sister   . Hypertension Sister     Social History History  Substance Use Topics  . Smoking status: Current Every Day Smoker -- 0.33 packs/day for 15 years    Types: Cigarettes  . Smokeless tobacco: Not on file  . Alcohol Use: Yes     Comment: occasionally; reports 1 pint of vodka per month at paydaysocially    Review of Systems  Constitutional: Negative for fever. Eyes: Negative for visual changes. ENT: Negative for sore throat. Cardiovascular: Negative for chest pain. Respiratory: Negative for shortness of breath. Gastrointestinal: Negative for abdominal pain, vomiting and diarrhea. Genitourinary: Negative for dysuria. Musculoskeletal: Negative for back pain. Skin: Negative for rash. Neurological: Negative for headaches, focal weakness or numbness.  ____________________________________________   PHYSICAL EXAM:  VITAL SIGNS: ED Triage Vitals  Enc Vitals Group     BP 07/20/14 1019 184/127 mmHg     Pulse Rate 07/20/14  1019 72     Resp 07/20/14 1019 17     Temp 07/20/14 1019 98 F (36.7 C)     Temp src --      SpO2 07/20/14 1019 96 %     Weight 07/20/14 1019 250 lb (113.399 kg)     Height 07/20/14 1019  (1.803 m)     Head Cir --      Peak Flow --      Pain Score 07/20/14 1020 7     Pain Loc --  Pain Edu? --      Excl. in GC? --      Constitutional: Alert and oriented. Well appearing and in no distress. Eyes: Conjunctivae are normal. PERRL. Normal extraocular movements. ENT   Head: Normocephalic and atraumatic.   Nose: No congestion/rhinnorhea.   Mouth/Throat: Mucous membranes are moist.   Neck: No stridor. Cardiovascular: Normal rate, regular rhythm.  No murmurs, rubs, or gallops. Respiratory: Normal respiratory effort without tachypnea nor retractions. Breath sounds are clear and equal bilaterally. No wheezes/rales/rhonchi. Gastrointestinal: Soft. No distention, no guarding, no rebound. Nontender  Genitourinary/rectal: Deferred Musculoskeletal: Nontender with normal range of motion in all extremities. No joint effusions.  No lower extremity tenderness nor edema. Neurologic:  Normal speech and language. No gross focal neurologic deficits are appreciated. 5 out of 5 strength in 4 extremities. Coordination intact. Sensory exam normal Skin:  Skin is warm, dry and intact. No rash noted. Psychiatric: Mood and affect are normal. Speech and behavior are normal. Patient exhibits appropriate insight and judgment.  ____________________________________________   EKG  I, Governor Rooks, MD, the attending physician have personally viewed and interpreted this ECG.   73 bpm. Normal sinus rhythm. Left axis deviation. Q waves septally. T-wave inversions laterally. ____________________________________________  LABS (pertinent positives/negatives)  White blood, 0.2, hemoglobin 15.6 Metabolic panel within normal limits other than a creatinine 1.26 Troponin 0.05 INR  1.04  ____________________________________________  RADIOLOGY Radiologist results reviewed  CT head:IMPRESSION: No evidence of acute infarction or intracranial hemorrhage by CT.  Chronic supra and infratentorial encephalomalacia from prior infarctions.  If patient's symptoms persist, MRI of the brain may be considered. __________________________________________  PROCEDURES  Procedure(s) performed: None Critical Care performed: None  ____________________________________________   ED COURSE / ASSESSMENT AND PLAN  Pertinent labs & imaging results that were available during my care of the patient were reviewed by me and considered in my medical decision making (see chart for details).  Patient came in with a headache with this severely elevated high blood pressure, however he does have a history of multiple high blood pressure so I'm not sure how much off of his baseline this might be. However his diastolic is near 098, so was given IV labetalol 20 mg. Unclear if his headache with left-sided arm tingling and numbness is due to hypertensive urgency/emergency versus nontoxic headache versus new stroke. His head CT is negative for acute stroke, however he will need a follow-up MRI of the brain. His headache is somewhat improved after the first available of labetalol, and his blood pressure did improve his diastolic down to 119, however it did spike back up to a systolic of 130 again, and patient was complaining of headache. He'll be given an additional dose of clonidine by mouth, as well as another dose of IV labetalol. I discussed the case with the hospitalist for admission for left-sided arm numbness and tingling with a history of stroke, as well as hypertensive urgency/emergency. His troponin was minimally elevated, however his EKG is unchanged from prior and his previous baseline troponin is 0.04 to 0.06.    ___________________________________________   FINAL CLINICAL IMPRESSION(S) /  ED DIAGNOSES   Final diagnoses:  Headache, unspecified headache type  Hypertensive urgency, malignant  Left arm numbness      Governor Rooks, MD 07/20/14 1348

## 2014-07-20 NOTE — ED Notes (Signed)
Patient to ED via EMS with report of headache along with left arm numbness and tingling that started this morning when he woke up. Patient reports eating some potted meat last night and believes this is what precipitated these symptoms.

## 2014-07-20 NOTE — ED Notes (Signed)
Lab called with report of critical lab on troponin at 0.05.  MD informed

## 2014-07-20 NOTE — ED Notes (Signed)
Patient transported to CT 

## 2014-07-21 ENCOUNTER — Observation Stay: Payer: Medicaid Other

## 2014-07-21 LAB — CBC
HEMATOCRIT: 41.8 % (ref 40.0–52.0)
Hemoglobin: 14.3 g/dL (ref 13.0–18.0)
MCH: 31.9 pg (ref 26.0–34.0)
MCHC: 34.2 g/dL (ref 32.0–36.0)
MCV: 93.5 fL (ref 80.0–100.0)
Platelets: 152 10*3/uL (ref 150–440)
RBC: 4.47 MIL/uL (ref 4.40–5.90)
RDW: 13.3 % (ref 11.5–14.5)
WBC: 10.6 10*3/uL (ref 3.8–10.6)

## 2014-07-21 LAB — BASIC METABOLIC PANEL
ANION GAP: 4 — AB (ref 5–15)
BUN: 23 mg/dL — ABNORMAL HIGH (ref 6–20)
CALCIUM: 8.4 mg/dL — AB (ref 8.9–10.3)
CO2: 25 mmol/L (ref 22–32)
Chloride: 108 mmol/L (ref 101–111)
Creatinine, Ser: 1.37 mg/dL — ABNORMAL HIGH (ref 0.61–1.24)
GFR calc Af Amer: >60 mL/min (ref >60)
GFR, EST NON AFRICAN AMERICAN: 57 mL/min — AB (ref >60)
Glucose, Bld: 167 mg/dL — ABNORMAL HIGH (ref 65–99)
Potassium: 3.4 mmol/L — ABNORMAL LOW (ref 3.5–5.1)
SODIUM: 137 mmol/L (ref 135–145)

## 2014-07-21 LAB — TROPONIN I: Troponin I: 0.04 ng/mL — ABNORMAL HIGH (ref <0.031)

## 2014-07-21 MED ORDER — CLOPIDOGREL BISULFATE 75 MG PO TABS: 75.0000 mg | ORAL_TABLET | Freq: Every day | ORAL | Status: DC

## 2014-07-21 MED ORDER — CLOPIDOGREL BISULFATE 75 MG PO TABS
75.0000 mg | ORAL_TABLET | Freq: Every day | ORAL | Status: DC
  Administered 2014-07-22: 75 mg via ORAL
  Filled 2014-07-21: qty 1

## 2014-07-21 MED ORDER — ASPIRIN 81 MG PO TBEC: 81.0000 mg | DELAYED_RELEASE_TABLET | Freq: Every day | ORAL | Status: DC

## 2014-07-21 MED ORDER — POTASSIUM CHLORIDE 20 MEQ/15ML (10%) PO SOLN
40.0000 meq | Freq: Once | ORAL | Status: AC
  Administered 2014-07-21: 40 meq via ORAL
  Filled 2014-07-21: qty 30

## 2014-07-21 MED ORDER — TUBERCULIN PPD 5 UNIT/0.1ML ID SOLN
5.0000 [IU] | Freq: Once | INTRADERMAL | Status: DC
  Administered 2014-07-21: 5 [IU] via INTRADERMAL
  Filled 2014-07-21: qty 0.1

## 2014-07-21 MED ORDER — NICOTINE 14 MG/24HR TD PT24: 14.0000 mg | MEDICATED_PATCH | Freq: Every day | TRANSDERMAL | Status: DC

## 2014-07-21 NOTE — Evaluation (Signed)
Physical Therapy Evaluation Patient Details Name: [name] [name]. MRN: [id number] DOB: Feb 20, 1960 Today's Date: 07/21/2014   History of Present Illness  Pt is a 54 year old male who was admitted to the hospital with hypertensive episode and numbness in left arm.    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 Examination reveals that pt is indep with bed mobility, and requires supervision with a single point cane for safety of movement when performing transfers. Pt ambulates 80 ft with single point cane and CGA for safety before fatiguing. Pt requires verbal cueing on how to safely and properly use the cane. Staggering left and right, and slight LOBs were noticed with ambulation, making PT concerned about pt returning home alone without assistance. His primary physical deficits include hemiplegia, decreased endurance, and poor balance with ambulation. Skilled physical therapy will benefit this pt to better these deficits and keep pt safe in home. Home health was recommended by PT, and pt agrees, but also still wishes to be placed in assisted living for management of medical issues and fear of falling by himself. VITALS:  Pre-ambulation: oxysat: 100%, HR: 72, BP: 129/87 Post-ambulation: oxysat: 100%, HR: 73, BP: 126/94 Before leaving room: BP: 139/87    Follow Up Recommendations Home health PT    Equipment Recommendations       Recommendations for Other Services       Precautions / Restrictions Restrictions Weight Bearing Restrictions: No      Mobility  Bed Mobility Overal bed mobility: Independent             General bed mobility comments: Pt indep with bed mobility   Transfers Overall transfer level: Needs assistance Equipment used: Straight cane Transfers: Sit to/from Stand Sit to Stand: Supervision         General transfer comment: Pt supervision with transfers to ensure safety of movement.    Ambulation/Gait Ambulation/Gait assistance: Min guard Ambulation Distance (Feet): 80 Feet Assistive device: Straight cane Gait Pattern/deviations: Staggering left;Staggering right Gait velocity: slow   General Gait Details: Pt ambulates with straight cane at CGA for safety. Pt tends to wobble laterally in gait, and does not always use the cane, but rather hovers it aboved the ground. Pt looks like he is about to lose balance at times. Fatigues after 80 ft.   Stairs            Wheelchair Mobility    Modified Rankin (Stroke Patients Only)       Balance                                             Pertinent Vitals/Pain Pain Assessment: No/denies pain    Home Living Family/patient expects to be discharged to:: Private residence Living Arrangements: Alone Available Help at Discharge: Other (Comment) (Pt has no help available, and is fearful of that.) Type of Home: Apartment Home Access: Level entry     Home Layout: One level Home Equipment: Cane - single point Additional Comments: Pt states he needs help moving around his apartment and doing some of the more difficult tasks such as cooking and cleaning for safety.      Prior Function Level of Independence: Independent with assistive device(s)         Comments: Pt uses single point cane since CVA 8  months ago.      Hand Dominance        Extremity/Trunk Assessment   Upper Extremity Assessment: Overall WFL for tasks assessed           Lower Extremity Assessment: LLE deficits/detail   LLE Deficits / Details: Left side weakness from recent CVA. 3+/5 MMT     Communication   Communication: Expressive difficulties  Cognition   Behavior During Therapy: WFL for tasks assessed/performed Overall Cognitive Status: Within Functional Limits for tasks assessed                      General Comments      Exercises        Assessment/Plan    PT Assessment Patient needs  continued PT services  PT Diagnosis Difficulty walking;Abnormality of gait;Hemiplegia non-dominant side   PT Problem List Decreased strength;Decreased activity tolerance;Decreased balance;Decreased mobility;Decreased knowledge of use of DME;Cardiopulmonary status limiting activity  PT Treatment Interventions DME instruction;Gait training;Stair training;Functional mobility training;Therapeutic activities;Therapeutic exercise;Balance training;Neuromuscular re-education;Patient/family education   PT Goals (Current goals can be found in the Care Plan section) Acute Rehab PT Goals Patient Stated Goal: to go to assisted living PT Goal Formulation: With patient Time For Goal Achievement: 08/04/14 Potential to Achieve Goals: Good    Frequency Min 2X/week   Barriers to discharge Decreased caregiver support Pt does not have any assistance available to him at home, and he lives alone. Pt states that he wishes to be in assisted living to safely function and not fall.     Co-evaluation               End of Session Equipment Utilized During Treatment: Gait belt Activity Tolerance: Patient tolerated treatment well Patient left: in bed;with call bell/phone within reach Nurse Communication: Mobility status    Functional Assessment Tool Used: 5 time sit-to-stand: 22.3 seconds Functional Limitation: Mobility: Walking and moving around    Time: 1354-1425 PT Time Calculation (min) (ACUTE ONLY): 31 min   Charges:         PT G Codes:   PT G-Codes **NOT FOR INPATIENT CLASS** Functional Assessment Tool Used: 5 time sit-to-stand: 22.3 seconds Functional Limitation: Mobility: Walking and moving around    [employer] 07/21/2014, 3:03 PM   [employer], SPT. [phone]
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
## 2014-07-21 NOTE — Discharge Summary (Addendum)
[facility] - [place] at [facility]   PATIENT NAME: [name] [name]    MR#:  [id number]  DATE OF BIRTH:  [birth year]  DATE OF ADMISSION:  07/20/2014 ADMITTING PHYSICIAN: Alford Highland, MD  DATE OF DISCHARGE: 07/22/2014 vsa PRIMARY CARE PHYSICIAN: Preston Fleeting, MD    ADMISSION DIAGNOSIS:  Left arm numbness [R20.8] Hypertensive urgency, malignant [I10] Headache, unspecified headache type [R51]  DISCHARGE DIAGNOSIS:  Active Problems:   Hypertensive encephalopathy   SECONDARY DIAGNOSIS:   Past Medical History  Diagnosis Date  . Hypertension   . Diabetes mellitus   . GERD (gastroesophageal reflux disease)   . Pericarditis   . Polysubstance abuse     cocaine, marijuana  . Auditory hallucinations   . Depression   . Hx of medication noncompliance   . History of suicide attempt   . Stroke February 2015    Right MCA aneurysm; head MRI - acute infarct of the right frontal lobe. Smaller acute infarct of the left frontal lobe. Thought to be due to cerebral emboli. Left PICA infarct. Small chronic lacunar infarct right internal capsule. Chronic microvascular ischemic change with white matter change.  . Coronary artery disease     No reported catheterization. Negative Myoview stress test and 2013.  Marland Kitchen Syncope and collapse   . Bipolar disorder   . Obesity, Class II, BMI 35-39.9, with comorbidity   . Hypertensive hypertrophic cardiomyopathy February 2015    Severe concentric LVH on echocardiogram.  . Aortic stenosis, mild February 2015    HOSPITAL COURSE:  This is a 54 year old male with a history of hypertension, tobacco dependence, hypertensive cardiomyopathy and stroke who presented with headache and dizziness.   1. Hypertensive encephalopathy: This was likely secondary to patient's noncompliance. Patient was resumed back on his maintenance antihypertensives and his blood pressure since then has significantly improved and is close to baseline  now. -His nausea vomiting, dizziness headache has improved and therefore he is being discharged home presently.  2. Diabetes: While in the hospital patient was maintained on sliding scale insulin although he'll resume his metformin upon discharge.  3. Hypertensive hypertrophic cardiomyopathy: Likely due to his medical noncompliance. -Blood pressure stable and will continue her maintenance antihypertensives.  4. History of right MCA stroke: Continue aspirin, Plavix  5. Diabetic neuropathy-continue Neurontin.  6. GERD-continue Protonix.  7. Depression/anxiety-continue BuSpar, Wellbutrin, trazodone  Patient was going to be discharged yesterday home although he wanted to go to her family care home and therefore his discharge was held.  Case management helped into placing her into a family care home, but he has changes mind and therefore now he is being discharged home.    DISCHARGE CONDITIONS AND DIET:  Patient will be discharged home on a heart healthy diet and diabetic diet.  CONSULTS OBTAINED:  Treatment Team:  Alford Highland, MD  DRUG ALLERGIES:  No Known Allergies  DISCHARGE MEDICATIONS:   Current Discharge Medication List    START taking these medications   Details  aspirin EC 81 MG EC tablet Take 1 tablet (81 mg total) by mouth daily. Qty: 120 tablet, Refills: 0    nicotine (NICODERM CQ - DOSED IN MG/24 HOURS) 14 mg/24hr patch Place 1 patch (14 mg total) onto the skin daily. Qty: 28 patch, Refills: 0      CONTINUE these medications which have CHANGED   Details  clopidogrel (PLAVIX) 75 MG tablet Take 1 tablet (75 mg total) by mouth daily. Qty: 30 tablet, Refills: 0    !! traZODone (  DESYREL) 150 MG tablet Take 1 tablet (150 mg total) by mouth at bedtime. Qty: 30 tablet, Refills: 1     !! - Potential duplicate medications found. Please discuss with provider.    CONTINUE these medications which have NOT CHANGED   Details  amLODipine (NORVASC) 10 MG tablet Take 10  mg by mouth daily.    buPROPion (WELLBUTRIN XL) 150 MG 24 hr tablet Take 150 mg by mouth daily.    !! busPIRone (BUSPAR) 10 MG tablet Take 10 mg by mouth 2 (two) times daily.    cloNIDine (CATAPRES) 0.2 MG tablet Take 0.2 mg by mouth 2 (two) times daily.     Ibuprofen-Diphenhydramine Cit (ADVIL PM) 200-38 MG TABS Take 1 tablet by mouth at bedtime as needed (for sleep).    lisinopril (PRINIVIL,ZESTRIL) 40 MG tablet Take 40 mg by mouth daily.    metFORMIN (GLUCOPHAGE) 500 MG tablet Take 500 mg by mouth 2 (two) times daily.    pantoprazole (PROTONIX) 40 MG tablet Take 40 mg by mouth daily.    pregabalin (LYRICA) 50 MG capsule Take 50 mg by mouth daily.    !! traZODone (DESYREL) 150 MG tablet Take 150 mg by mouth at bedtime.    !! busPIRone (BUSPAR) 5 MG tablet Take 1 tablet (5 mg total) by mouth 2 (two) times daily. Qty: 30 tablet, Refills: 0    chlorthalidone (HYGROTON) 25 MG tablet Take 1 tablet (25 mg total) by mouth daily. Qty: 90 tablet, Refills: 3     !! - Potential duplicate medications found. Please discuss with provider.       Today   CHIEF COMPLAINT:   Pt. Is doing well w/out any complaints. BP stable.  Pt. Was going to be discharged to a Family Care home that's why his discharge was held but he has changed his mind and will be going home today.     VITAL SIGNS:  Blood pressure 142/95, pulse 63, temperature 97.7 F (36.5 C), temperature source Oral, resp. rate 22, height 5\' 11"  (1.803 m), weight 108.86 kg (239 lb 15.9 oz), SpO2 100 %.   REVIEW OF SYSTEMS:  Review of Systems  Constitutional: Negative for fever, chills and malaise/fatigue.  HENT: Negative for sore throat.   Eyes: Negative for blurred vision.  Respiratory: Negative for cough, hemoptysis, shortness of breath and wheezing.   Cardiovascular: Negative for chest pain, palpitations and leg swelling.  Gastrointestinal: Negative for nausea, vomiting, abdominal pain, diarrhea and blood in stool.   Genitourinary: Negative for dysuria.  Musculoskeletal: Negative for back pain.  Neurological: Negative for dizziness, tremors, weakness and headaches.  Endo/Heme/Allergies: Does not bruise/bleed easily.     PHYSICAL EXAMINATION:  GENERAL:  53 y.o.-year-old patient lying in the bed with no acute distress.  NECK:  Supple, no jugular venous distention. No thyroid enlargement, no tenderness.  LUNGS: Normal breath sounds bilaterally, no wheezing, rales,rhonchi  No use of accessory muscles of respiration.  CARDIOVASCULAR: S1, S2 normal. No murmurs, rubs, or gallops.  ABDOMEN: Soft, non-tender, non-distended. Bowel sounds present. No organomegaly or mass.  EXTREMITIES: No pedal edema, cyanosis, or clubbing.  PSYCHIATRIC: The patient is alert and oriented x 3.  SKIN: No obvious rash, lesion, or ulcer.   DATA REVIEW:   CBC  Recent Labs Lab 07/21/14 0319  WBC 10.6  HGB 14.3  HCT 41.8  PLT 152    Chemistries   Recent Labs Lab 07/20/14 1029 07/21/14 0319  NA 137 137  K 3.8 3.4*  CL 109 108  CO2 25 25  GLUCOSE 161* 167*  BUN 20 23*  CREATININE 1.26* 1.37*  CALCIUM 8.7* 8.4*  AST 18  --   ALT 12*  --   ALKPHOS 58  --   BILITOT 0.9  --     Cardiac Enzymes  Recent Labs Lab 07/20/14 1705 07/20/14 2156 07/21/14 0319  TROPONINI 0.04* 0.05* 0.04*    Microbiology Results  @  RADIOLOGY:  Dg Chest 2 View  07/21/2014   CLINICAL DATA:  Headache, left arm numbness  EXAM: CHEST  2 VIEW  COMPARISON:  04/21/2014  FINDINGS: There is mild bilateral interstitial prominence. There is no focal parenchymal opacity. There is no pleural effusion or pneumothorax. The heart and mediastinal contours are unremarkable.  The osseous structures are unremarkable.  IMPRESSION: No active cardiopulmonary disease.   Electronically Signed   By: Elige Ko   On: 07/21/2014 15:52      Management plans discussed with the patient and he is in agreement. Stable for discharge  home  Patient should follow up with PCP in one week  CODE STATUS:     Code Status Orders        Start     Ordered   07/20/14 1538  Full code   Continuous     07/20/14 1538      TOTAL TIME TAKING CARE OF THIS PATIENT: 40 minutes.    Houston Siren M.D on 07/22/2014 at 1:35 PM  Between 7am to 6pm - Pager - [phone] After 6pm go to [email or web address] - password [email or web address] [facility]  [place] [facility]  Office  [phone]  CC: Primary care physician; Preston Fleeting, MD

## 2014-07-21 NOTE — Care Management (Addendum)
Patient has discharge order.  Contacted MD to obtain order for SN and SW and face to face  SN needed to monitor med administration and vital signs.  SW- to assist with ?placement in family care living environment;  follow through on medicaid transportation application, application for medicaid personal care services- should patient not be able to pursue family care home.  No physical therapy recommendations.  CSW to see.  Referral to [facility] for SN and SW and patient will be seen 6/17.

## 2014-07-21 NOTE — Care Management (Addendum)
Order present for CM assessment.  Patient presents from home.  He verbalizes that needs transportation services but can not get the application from DSS.  Says he needs personal care services through [facility] but has not heard back from [place].  He is followed by [facility] physician- but does not remember the name of the doctor.  Says that he can not get his medications because he does not have transportation to get to the drug store.  Discussed finding a drug store that can deliver or having his sister or other family  member to assist with overall transportation.  He states that he is having more and more trouble managing on his own and requests assist for assisted living/fch placement.  Patient volunteers verbal understanding that "it will take medicaid and my check (Receives 700 dollars a month) to pay for assisted living. Referral to CSW

## 2014-07-21 NOTE — Care Management (Signed)
Patient says he is out of cell phone minutes.  Confirms address is correct.  Discussed with [facility] that staff will just make "drop in call" because at present time no way to contact patient.  Phone number on face sheet is his sister phone.

## 2014-07-21 NOTE — Clinical Social Work Note (Signed)
CSW spoke to pt.  Per pt he is in agreement with placement at [facility].  CSW will contact family care homes to see if any are available to take pt.  Pt is aware that [facility] will require him to sign over his disability.  CSW will also need to call his insurance to confirm that [facility] is covered under his policy.  CSW also confirmed that pt would be able to return to his current residence if [facility] would be able to pay for his [employer].  CSW will contact [facility] for verification of amount owed and contact foundation first thing in the morning.

## 2014-07-21 NOTE — Evaluation (Signed)
Physical Therapy Evaluation Patient Details Name: [name] [name]. MRN: [id number] DOB: 28-Feb-1960 Today's Date: 07/21/2014   History of Present Illness  Pt is a 54 year old male who was admitted to the hospital with hypertensive episode and numbness in left arm.    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 Examination reveals that pt is indep with bed mobility, and requires supervision with a single point cane for safety of movement when performing transfers. Pt ambulates 80 ft with single point cane and CGA for safety before fatiguing. Pt requires verbal cueing on how to safely and properly use the cane. Staggering left and right, and slight LOBs were noticed with ambulation, making PT concerned about pt returning home alone without assistance. His primary physical deficits include hemiplegia, decreased endurance, and poor balance with ambulation. Skilled physical therapy will benefit this pt to better these deficits and keep pt safe in home. Home health was recommended by PT, and pt agrees, but also still wishes to be placed in assisted living for management of medical issues and fear of falling by himself. VITALS:  Pre-ambulation: oxysat: 100%, HR: 72, BP: 129/87 Post-ambulation: oxysat: 100%, HR: 73, BP: 126/94 Before leaving room: BP: 139/87 This entire session was guided, instructed, and directly supervised by Elizabeth Palau, DPT.  .    Follow Up Recommendations Home health PT    Equipment Recommendations       Recommendations for Other Services       Precautions / Restrictions Restrictions Weight Bearing Restrictions: No      Mobility  Bed Mobility Overal bed mobility: Independent             General bed mobility comments: Pt indep with bed mobility   Transfers Overall transfer level: Needs assistance Equipment used: Straight cane Transfers: Sit to/from Stand Sit to Stand: Supervision          General transfer comment: Pt supervision with transfers to ensure safety of movement.   Ambulation/Gait Ambulation/Gait assistance: Min guard Ambulation Distance (Feet): 80 Feet Assistive device: Straight cane Gait Pattern/deviations: Staggering left;Staggering right Gait velocity: slow   General Gait Details: Pt ambulates with straight cane at CGA for safety. Pt tends to wobble laterally in gait, and does not always use the cane, but rather hovers it aboved the ground. Pt looks like he is about to lose balance at times. Fatigues after 80 ft.   Stairs            Wheelchair Mobility    Modified Rankin (Stroke Patients Only)       Balance                                             Pertinent Vitals/Pain Pain Assessment: No/denies pain    Home Living Family/patient expects to be discharged to:: Private residence Living Arrangements: Alone Available Help at Discharge: Other (Comment) (Pt has no help available, and is fearful of that.) Type of Home: Apartment Home Access: Level entry     Home Layout: One level Home Equipment: Cane - single point Additional Comments: Pt states he needs help moving around his apartment and doing some of the more difficult tasks such as cooking and cleaning for safety.      Prior Function Level of Independence: Independent with assistive device(s)  Comments: Pt uses single point cane since CVA 8 months ago.      Hand Dominance        Extremity/Trunk Assessment   Upper Extremity Assessment: Overall WFL for tasks assessed           Lower Extremity Assessment: LLE deficits/detail   LLE Deficits / Details: Left side weakness from recent CVA. 3+/5 MMT     Communication   Communication: Expressive difficulties  Cognition   Behavior During Therapy: WFL for tasks assessed/performed Overall Cognitive Status: Within Functional Limits for tasks assessed                      General  Comments      Exercises        Assessment/Plan    PT Assessment Patient needs continued PT services  PT Diagnosis Difficulty walking;Abnormality of gait;Hemiplegia non-dominant side   PT Problem List Decreased strength;Decreased activity tolerance;Decreased balance;Decreased mobility;Decreased knowledge of use of DME;Cardiopulmonary status limiting activity  PT Treatment Interventions DME instruction;Gait training;Stair training;Functional mobility training;Therapeutic activities;Therapeutic exercise;Balance training;Neuromuscular re-education;Patient/family education   PT Goals (Current goals can be found in the Care Plan section) Acute Rehab PT Goals Patient Stated Goal: to go to assisted living PT Goal Formulation: With patient Time For Goal Achievement: 08/04/14 Potential to Achieve Goals: Good    Frequency Min 2X/week   Barriers to discharge Decreased caregiver support Pt does not have any assistance available to him at home, and he lives alone. Pt states that he wishes to be in assisted living to safely function and not fall.     Co-evaluation               End of Session Equipment Utilized During Treatment: Gait belt Activity Tolerance: Patient tolerated treatment well Patient left: in bed;with call bell/phone within reach Nurse Communication: Mobility status    Functional Assessment Tool Used: 5 time sit-to-stand: 22.3 seconds Functional Limitation: Mobility: Walking and moving around    Time: 1354-1425 PT Time Calculation (min) (ACUTE ONLY): 31 min   Charges:         PT G Codes:   PT G-Codes **NOT FOR INPATIENT CLASS** Functional Assessment Tool Used: 5 time sit-to-stand: 22.3 seconds Functional Limitation: Mobility: Walking and moving around    [employer] 07/21/2014, 3:24 PM  [employer], SPT. [phone]

## 2014-07-22 MED ORDER — TRAZODONE HCL 150 MG PO TABS: 150.0000 mg | ORAL_TABLET | Freq: Every day | ORAL | Status: DC

## 2014-07-22 NOTE — Discharge Instructions (Signed)
°  DIET:  Cardiac diet  DISCHARGE CONDITION:  Stable  ACTIVITY:  Activity as tolerated  OXYGEN:  Home Oxygen: No.   Oxygen Delivery: room air  DISCHARGE LOCATION:  Home with home health nursing and social work   If you experience worsening of your admission symptoms, develop shortness of breath, life threatening emergency, suicidal or homicidal thoughts you must seek medical attention immediately by calling 911 or calling your MD immediately  if symptoms less severe.  You Must read complete instructions/literature along with all the possible adverse reactions/side effects for all the Medicines you take and that have been prescribed to you. Take any new Medicines after you have completely understood and accpet all the possible adverse reactions/side effects.   Please note  You were cared for by a hospitalist during your hospital stay. If you have any questions about your discharge medications or the care you received while you were in the hospital after you are discharged, you can call the unit and asked to speak with the hospitalist on call if the hospitalist that took care of you is not available. Once you are discharged, your primary care physician will handle any further medical issues. Please note that NO REFILLS for any discharge medications will be authorized once you are discharged, as it is imperative that you return to your primary care physician (or establish a relationship with a primary care physician if you do not have one) for your aftercare needs so that they can reassess your need for medications and monitor your lab values.

## 2014-07-22 NOTE — Progress Notes (Signed)
Discharge instructions along with home medication list and follow up gone over with patient. Patient verbalized that he understood instructions. Home medication list gone over with patient in great detail with patient. Iv and telemetry removed. Printed rx given to patient. Patient to be discharged home on ra. No distress noted. Waiting on ride, per patient his ride will be here in about [employer]

## 2014-07-22 NOTE — Care Management (Signed)
Patient has now decided against proceeding to family care home.  To discharge home.  CSW is working to get [employer] paid.  Notified [facility]

## 2014-07-22 NOTE — Progress Notes (Signed)
[facility] - [place] at [facility]   PATIENT NAME: [name] [name]    MR#:  [id number]  DATE OF BIRTH:  1960/03/10  SUBJECTIVE:  Concerned about his living situation Not compliant with medications. No NEW neurological issues  REVIEW OF SYSTEMS:    Review of Systems  Constitutional: Negative for fever, chills and malaise/fatigue.  HENT: Negative for sore throat.   Eyes: Negative for blurred vision.  Respiratory: Negative for cough, hemoptysis, shortness of breath and wheezing.   Cardiovascular: Negative for chest pain, palpitations and leg swelling.  Gastrointestinal: Negative for nausea, vomiting, abdominal pain, diarrhea and blood in stool.  Genitourinary: Negative for dysuria.  Musculoskeletal: Negative for back pain.  Neurological: Negative for dizziness, tremors and headaches.  Endo/Heme/Allergies: Does not bruise/bleed easily.    Tolerating Diet: yes      DRUG ALLERGIES:  No Known Allergies  VITALS:  Blood pressure 142/95, pulse 63, temperature 97.7 F (36.5 C), temperature source Oral, resp. rate 22, height 5\' 11"  (1.803 m), weight 108.86 kg (239 lb 15.9 oz), SpO2 100 %.  PHYSICAL EXAMINATION:   Physical Exam  Constitutional: He is oriented to person, place, and time and well-developed, well-nourished, and in no distress. No distress.  HENT:  Head: Normocephalic.  Eyes: No scleral icterus.  Neck: Normal range of motion. Neck supple. No JVD present. No tracheal deviation present.  Cardiovascular: Normal rate, regular rhythm and normal heart sounds.  Exam reveals no gallop and no friction rub.   No murmur heard. Pulmonary/Chest: Effort normal and breath sounds normal. No respiratory distress. He has no wheezes. He has no rales. He exhibits no tenderness.  Abdominal: Soft. Bowel sounds are normal. He exhibits no distension and no mass. There is no tenderness. There is no rebound and no guarding.  Musculoskeletal: Normal range of motion.  He exhibits no edema.  Neurological: He is alert and oriented to person, place, and time.  Skin: Skin is warm. No rash noted. No erythema.  Psychiatric: Affect and judgment normal.      LABORATORY PANEL:   CBC  Recent Labs Lab 07/21/14 0319  WBC 10.6  HGB 14.3  HCT 41.8  PLT 152   ------------------------------------------------------------------------------------------------------------------  Chemistries   Recent Labs Lab 07/20/14 1029 07/21/14 0319  NA 137 137  K 3.8 3.4*  CL 109 108  CO2 25 25  GLUCOSE 161* 167*  BUN 20 23*  CREATININE 1.26* 1.37*  CALCIUM 8.7* 8.4*  AST 18  --   ALT 12*  --   ALKPHOS 58  --   BILITOT 0.9  --    ------------------------------------------------------------------------------------------------------------------  Cardiac Enzymes  Recent Labs Lab 07/20/14 1705 07/20/14 2156 07/21/14 0319  TROPONINI 0.04* 0.05* 0.04*   ------------------------------------------------------------------------------------------------------------------  RADIOLOGY:  Dg Chest 2 View  07/21/2014   CLINICAL DATA:  Headache, left arm numbness  EXAM: CHEST  2 VIEW  COMPARISON:  04/21/2014  FINDINGS: There is mild bilateral interstitial prominence. There is no focal parenchymal opacity. There is no pleural effusion or pneumothorax. The heart and mediastinal contours are unremarkable.  The osseous structures are unremarkable.  IMPRESSION: No active cardiopulmonary disease.   Electronically Signed   By: Elige Ko   On: 07/21/2014 15:52     ASSESSMENT AND PLAN:  This is a 54 year old male with a history of hypertension, tobacco dependence, hypertensive cardiomyopathy and stroke who presented with headache and dizziness. For further details please further H&P.  1. Hypertensive encephalopathy: Patient appears to be at baseline. Patient's blood pressure  was very elevated on admission. Patient does not appear to be taking his outpatient medications.  Patient was restarted on his outpatient medications. His blood pressures have improved.  2. Diabetes: Patient will continue outpatient medications.  3. Hypertensive hypertrophic cardiomyopathy: Patient will continue his outpatient medications and really needs good follow-up.  4. History of right MCA stroke: Patient should continue antiplatelet therapy.  Case Management to assist with d/c planning   CODE STATUS: FULL  TOTAL TIME TAKING CARE OF THIS PATIENT:31 minutes.   Greater than 50% counseling and coordination of care  POSSIBLE D/C with HHC , DEPENDING ON PT consult and CM  Kasia Trego M.D on 07/22/2014 at 12:13 PM  Between 7am to 6pm - Pager - [phone] After 6pm go to [email or web address] - password [email or web address] [facility]  [place] [facility]  Office  [phone]  CC: Primary care physician; Preston Fleeting, MD

## 2014-07-22 NOTE — Clinical Social Work Note (Signed)
CSW contacted charitable foundation for [employer]. CSW confirmed that foundation would make payment for pt and that he is now able to return to his current residence. Pt no longer wants to look for family care home. CSW verified that home health and SW will be out to see him after DC. CSW also strongly encouraged pt to consider Family Care home for the future. Pt stated that he understood this. CSW also verified a second time with [employer] that pt could return with the commitment form [employer] Score for the [employer]. CSW notified RN this was completed and pt could DC to his residence. CSW signing off.

## 2014-07-25 ENCOUNTER — Emergency Department
Admission: EM | Admit: 2014-07-25 | Discharge: 2014-07-25 | Disposition: A | Discharge status: Home / Self Care | Payer: Medicaid Other | Attending: Emergency Medicine | Admitting: Emergency Medicine

## 2014-07-25 ENCOUNTER — Encounter: Payer: Self-pay | Admitting: Emergency Medicine

## 2014-07-25 DIAGNOSIS — Z720 Tobacco use: Secondary | ICD-10-CM | POA: Diagnosis not present

## 2014-07-25 DIAGNOSIS — I10 Essential (primary) hypertension: Secondary | ICD-10-CM | POA: Diagnosis not present

## 2014-07-25 DIAGNOSIS — Z79899 Other long term (current) drug therapy: Secondary | ICD-10-CM | POA: Insufficient documentation

## 2014-07-25 DIAGNOSIS — Z7982 Long term (current) use of aspirin: Secondary | ICD-10-CM | POA: Diagnosis not present

## 2014-07-25 DIAGNOSIS — R531 Weakness: Secondary | ICD-10-CM

## 2014-07-25 DIAGNOSIS — Z7902 Long term (current) use of antithrombotics/antiplatelets: Secondary | ICD-10-CM | POA: Diagnosis not present

## 2014-07-25 DIAGNOSIS — I159 Secondary hypertension, unspecified: Secondary | ICD-10-CM

## 2014-07-25 DIAGNOSIS — E119 Type 2 diabetes mellitus without complications: Secondary | ICD-10-CM | POA: Insufficient documentation

## 2014-07-25 LAB — COMPREHENSIVE METABOLIC PANEL
ALBUMIN: 4 g/dL (ref 3.5–5.0)
ALT: 11 U/L — ABNORMAL LOW (ref 17–63)
AST: 14 U/L — ABNORMAL LOW (ref 15–41)
Alkaline Phosphatase: 64 U/L (ref 38–126)
Anion gap: 4 — ABNORMAL LOW (ref 5–15)
BUN: 16 mg/dL (ref 6–20)
CO2: 25 mmol/L (ref 22–32)
Calcium: 8.7 mg/dL — ABNORMAL LOW (ref 8.9–10.3)
Chloride: 107 mmol/L (ref 101–111)
Creatinine, Ser: 1.21 mg/dL (ref 0.61–1.24)
GFR calc Af Amer: >60 mL/min (ref >60)
Glucose, Bld: 147 mg/dL — ABNORMAL HIGH (ref 65–99)
Potassium: 3.7 mmol/L (ref 3.5–5.1)
Sodium: 136 mmol/L (ref 135–145)
Total Bilirubin: 0.4 mg/dL (ref 0.3–1.2)
Total Protein: 6.9 g/dL (ref 6.5–8.1)

## 2014-07-25 LAB — CBC
HEMATOCRIT: 44 % (ref 40.0–52.0)
HEMOGLOBIN: 14.9 g/dL (ref 13.0–18.0)
MCH: 31.5 pg (ref 26.0–34.0)
MCHC: 33.9 g/dL (ref 32.0–36.0)
MCV: 93 fL (ref 80.0–100.0)
Platelets: 161 10*3/uL (ref 150–440)
RBC: 4.73 MIL/uL (ref 4.40–5.90)
RDW: 13.4 % (ref 11.5–14.5)
WBC: 10.2 10*3/uL (ref 3.8–10.6)

## 2014-07-25 LAB — CKMB (ARMC ONLY): CK, MB: 2.8 ng/mL (ref 0.5–5.0)

## 2014-07-25 LAB — TROPONIN I: Troponin I: 0.04 ng/mL — ABNORMAL HIGH (ref <0.031)

## 2014-07-25 MED ORDER — AMLODIPINE BESYLATE 10 MG PO TABS: 10.0000 mg | ORAL_TABLET | Freq: Every day | ORAL | Status: DC

## 2014-07-25 MED ORDER — LISINOPRIL 40 MG PO TABS: 40.0000 mg | ORAL_TABLET | Freq: Every day | ORAL | Status: DC

## 2014-07-25 MED ORDER — BUPROPION HCL ER (XL) 150 MG PO TB24: 150.0000 mg | ORAL_TABLET | ORAL | Status: DC

## 2014-07-25 MED ORDER — PREGABALIN 50 MG PO CAPS: 50.0000 mg | ORAL_CAPSULE | Freq: Every day | ORAL | Status: DC

## 2014-07-25 MED ORDER — PANTOPRAZOLE SODIUM 40 MG PO TBEC: 40.0000 mg | DELAYED_RELEASE_TABLET | Freq: Every day | ORAL | Status: DC

## 2014-07-25 MED ORDER — CLONIDINE HCL 0.2 MG PO TABS: 0.2000 mg | ORAL_TABLET | Freq: Two times a day (BID) | ORAL | Status: DC

## 2014-07-25 MED ORDER — CHLORTHALIDONE 25 MG PO TABS: 25.0000 mg | ORAL_TABLET | Freq: Every day | ORAL | Status: DC

## 2014-07-25 MED ORDER — METFORMIN HCL 500 MG PO TABS: 500.0000 mg | ORAL_TABLET | Freq: Two times a day (BID) | ORAL | Status: DC

## 2014-07-25 NOTE — ED Notes (Signed)
Patient to ED with c/o weakness that started last night. Patient was discharged out of hospital Friday evening with diagnosis of HTN.

## 2014-07-25 NOTE — ED Notes (Signed)
Pt has some social work issues, his electricity has been d/c. Stephanie case Production designer, theatre/television/film is currently working on this. MD aware. Pt requesting a sandwich states he has not eaten all day. Pt provided with sandwich tray and drink.

## 2014-07-25 NOTE — Care Management (Signed)
Received call from ED CM that patient presented to ED for weakness and has not been seen by [facility].  Contacted agency to confirm referral had been received and informed it had been received but "not entered until Saturday."  Discussed that this CM had spoken with liaision  Of the priority to have patient seen within 24 hours of DC (was d/c from [facility] 6/17- visit should have been made 6/18.) There was mention that patient could not be located by phone and this CM reminded the agency that it was a known fact that patient did not have minutes on his phone and would need to make "drop in visit."  Discussed that patient said his sister works and usually can not be contacted on her home phone.  Stressed the importance that patient be seen today when d/c from ED or 6/21

## 2014-07-25 NOTE — ED Notes (Signed)
MD Derrill Kay at bedside with this RN. Pt states he was admitted to [facility] last week and d/c Fri. Pt was given 3 additional medications to add to his regular meds. Pt has been taking the 3 medications given at d/c but did not inform MD or nurse prior to d/c that he was out of all his regular medications and has been for 2 weeks prior to admission. Pt also wanted someone to come to his home to help him, home health worker and states "Ashely" spoke with him about this during his admission.

## 2014-07-25 NOTE — ED Provider Notes (Signed)
[facility] Emergency Department Provider Note  ____________________________________________  Time seen: 1240  I have reviewed the triage vital signs and the nursing notes.   HISTORY  Chief Complaint Weakness   History limited by: Not Limited   HPI [name] [name]. is a 54 y.o. male who presents to the emergency department today because of weakness and fatigue. He states this is been happening for 2-3 days. Patient was discharged from the hospital 3 days ago. He states that they gave him a prescription for 3 medications. Additionally the patient is supposed be on 10 other medications however did not let them know that he was out of these medications so did not get those prescriptions filled. He states that he does have an appointment with his primary care doctor in 1 week. He thought that he had home health coming meds while he did not tell him that he did not have those medications. He denies any fevers.     Past Medical History  Diagnosis Date  . Hypertension   . Diabetes mellitus   . GERD (gastroesophageal reflux disease)   . Pericarditis   . Polysubstance abuse     cocaine, marijuana  . Auditory hallucinations   . Depression   . Hx of medication noncompliance   . History of suicide attempt   . Stroke February 2015    Right MCA aneurysm; head MRI - acute infarct of the right frontal lobe. Smaller acute infarct of the left frontal lobe. Thought to be due to cerebral emboli. Left PICA infarct. Small chronic lacunar infarct right internal capsule. Chronic microvascular ischemic change with white matter change.  . Coronary artery disease     No reported catheterization. Negative Myoview stress test and 2013.  Marland Kitchen Syncope and collapse   . Bipolar disorder   . Obesity, Class II, BMI 35-39.9, with comorbidity   . Hypertensive hypertrophic cardiomyopathy February 2015    Severe concentric LVH on echocardiogram.  . Aortic stenosis, mild February 2015     Patient Active Problem List   Diagnosis Date Noted  . CVA (cerebral vascular accident) 05/20/2013  . LVH (left ventricular hypertrophy) 05/20/2013  . Obesity (BMI 30-39.9) 04/10/2013  . Obesity, Class II, BMI 35-39.9, with comorbidity   . Hypertensive hypertrophic cardiomyopathy 03/07/2013  . Aortic stenosis, mild 03/07/2013  . Hypertension 01/06/2013  . Hypertensive urgency 01/05/2013  . DM (diabetes mellitus), type 2, uncontrolled 01/05/2013  . Anxiety 01/12/2012  . Depression with suicidal ideation 01/09/2012    Class: Acute  . Cocaine abuse with cocaine-induced anxiety disorder 01/09/2012    Class: Acute  . Hypertensive encephalopathy 11/07/2010  . Polysubstance abuse 11/07/2010  . DM 01/25/2009  . PSYCHIATRIC DISORDER 01/25/2009  . Essential hypertension, malignant 01/25/2009  . PERICARDIAL TAMPONADE 01/25/2009  . INSOMNIA 01/25/2009  . Shortness of breath 01/25/2009  . EPIGASTRIC PAIN 01/25/2009    Past Surgical History  Procedure Laterality Date  . Pericardial fluid drainage    . Transthoracic echocardiogram  03/23/2013    EF 5560%. Elevated left atrial pressure. Severe concentric LVH. Mild aortic stenosis. -- Appearance of hypertensive hypertrophic heart disease.  No clear cardioembolic source  . Nm myoview ltd  July 2013    No ischemia or infarct. There is inferior wall defec suggestive of diaphragmatic attenuation. Mild LV dilation at to be due to hypertensive heart disease. EF 55%. Poor exercise tolerance.  . Carotid dopplers:  03/23/2013    Interval thickening of bilateral common carotid no significant plaque formation. Tortuosity  but no significant stenosis. Patent bilateral vertebral  arteries    Current Outpatient Rx  Name  Route  Sig  Dispense  Refill  . amLODipine (NORVASC) 10 MG tablet   Oral   Take 10 mg by mouth daily.         Marland Kitchen aspirin EC 81 MG EC tablet   Oral   Take 1 tablet (81 mg total) by mouth daily.   120 tablet   0   . buPROPion  (WELLBUTRIN XL) 150 MG 24 hr tablet   Oral   Take 150 mg by mouth daily.         . busPIRone (BUSPAR) 10 MG tablet   Oral   Take 10 mg by mouth 2 (two) times daily.         . busPIRone (BUSPAR) 5 MG tablet   Oral   Take 1 tablet (5 mg total) by mouth 2 (two) times daily. Patient not taking: Reported on 07/20/2014   30 tablet   0   . chlorthalidone (HYGROTON) 25 MG tablet   Oral   Take 1 tablet (25 mg total) by mouth daily. Patient not taking: Reported on 07/20/2014   90 tablet   3   . cloNIDine (CATAPRES) 0.2 MG tablet   Oral   Take 0.2 mg by mouth 2 (two) times daily.          . clopidogrel (PLAVIX) 75 MG tablet   Oral   Take 1 tablet (75 mg total) by mouth daily.   30 tablet   0   . Ibuprofen-Diphenhydramine Cit (ADVIL PM) 200-38 MG TABS   Oral   Take 1 tablet by mouth at bedtime as needed (for sleep).         Marland Kitchen lisinopril (PRINIVIL,ZESTRIL) 40 MG tablet   Oral   Take 40 mg by mouth daily.         . metFORMIN (GLUCOPHAGE) 500 MG tablet   Oral   Take 500 mg by mouth 2 (two) times daily.         . nicotine (NICODERM CQ - DOSED IN MG/24 HOURS) 14 mg/24hr patch   Transdermal   Place 1 patch (14 mg total) onto the skin daily.   28 patch   0   . pantoprazole (PROTONIX) 40 MG tablet   Oral   Take 40 mg by mouth daily.         . pregabalin (LYRICA) 50 MG capsule   Oral   Take 50 mg by mouth daily.         . traZODone (DESYREL) 150 MG tablet   Oral   Take 150 mg by mouth at bedtime.         . traZODone (DESYREL) 150 MG tablet   Oral   Take 1 tablet (150 mg total) by mouth at bedtime.   30 tablet   1     Allergies Review of patient's allergies indicates no known allergies.  Family History  Problem Relation Age of Onset  . Stroke Mother   . Diabetes type II Mother   . Hypertension Mother   . Hypertension Father   . Arthritis Father   . Arthritis Sister   . Diabetes type II Sister   . Hypertension Sister     Social  History History  Substance Use Topics  . Smoking status: Current Every Day Smoker -- 0.50 packs/day for 15 years    Types: Cigarettes  . Smokeless tobacco: Not on file  . Alcohol  Use: Yes     Comment: occasionally; reports 1 pint of vodka per month at paydaysocially    Review of Systems  Constitutional: Negative for fever. Cardiovascular: Negative for chest pain. Respiratory: Negative for shortness of breath. Gastrointestinal: Negative for abdominal pain, vomiting and diarrhea. Genitourinary: Negative for dysuria. Musculoskeletal: Negative for back pain. Skin: Negative for rash. Neurological: Negative for headaches, focal weakness or numbness.   10-point ROS otherwise negative.  ____________________________________________   PHYSICAL EXAM:  VITAL SIGNS: ED Triage Vitals  Enc Vitals Group     BP 07/25/14 1156 168/116 mmHg     Pulse Rate 07/25/14 1156 84     Resp 07/25/14 1156 20     Temp 07/25/14 1156 98.2 F (36.8 C)     Temp Source 07/25/14 1156 Oral     SpO2 07/25/14 1156 98 %     Weight 07/25/14 1156 280 lb (127.007 kg)     Height 07/25/14 1156  (1.778 m)     Head Cir --      Peak Flow --      Pain Score --      Pain Loc --      Pain Edu? --      Excl. in GC? --      Constitutional: Alert and oriented. Well appearing and in no distress. Eyes: Conjunctivae are normal. PERRL. Normal extraocular movements. ENT   Head: Normocephalic and atraumatic.   Nose: No congestion/rhinnorhea.   Mouth/Throat: Mucous membranes are moist.   Neck: No stridor. Hematological/Lymphatic/Immunilogical: No cervical lymphadenopathy. Cardiovascular: Normal rate, regular rhythm.  No murmurs, rubs, or gallops. Respiratory: Normal respiratory effort without tachypnea nor retractions. Breath sounds are clear and equal bilaterally. No wheezes/rales/rhonchi. Gastrointestinal: Soft and nontender. No distention. There is no CVA tenderness. Genitourinary:  Deferred Musculoskeletal: Normal range of motion in all extremities. No joint effusions.  No lower extremity tenderness nor edema. Neurologic:  Normal speech and language. No gross focal neurologic deficits are appreciated. Speech is normal.  Skin:  Skin is warm, dry and intact. No rash noted. Psychiatric: Mood and affect are normal. Speech and behavior are normal. Patient exhibits appropriate insight and judgment.  ____________________________________________    LABS (pertinent positives/negatives)  Labs Reviewed  COMPREHENSIVE METABOLIC PANEL - Abnormal; Notable for the following:    Glucose, Bld 147 (*)    Calcium 8.7 (*)    AST 14 (*)    ALT 11 (*)    Anion gap 4 (*)    All other components within normal limits  TROPONIN I - Abnormal; Notable for the following:    Troponin I 0.04 (*)    All other components within normal limits  CBC  [facility] ONLY)     ____________________________________________   EKG  I, Phineas Semen, attending physician, personally viewed and interpreted this EKG  EKG Time: 1201 Rate: 84 Rhythm: Normal sinus rhythm Axis: Left axis deviation Intervals: qTC 489 QRS: Narrow, Q waves V1 ST changes: No ST elevation    ____________________________________________    RADIOLOGY  None  ____________________________________________   PROCEDURES  Procedure(s) performed: None  Critical Care performed: No  ____________________________________________   INITIAL IMPRESSION / ASSESSMENT AND PLAN / ED COURSE  Pertinent labs & imaging results that were available during my care of the patient were reviewed by me and considered in my medical decision making (see chart for details).  Patient presents to the emergency department with primary complaints of fatigue and weakness. He does state that he did not get many  of his medications filled when he was discharged from the hospital. The also has some social work issues. Blood work here without  any concerning findings, troponin was minimally elevated however stayed elevated throughout his recent admission same level. I had a care management discussed with social worker about making sure patient's electricity would be turned on. I will also fill the patient's prescription that he was missing. Patient states he has a primary care doctor appointment in one week.  ____________________________________________   FINAL CLINICAL IMPRESSION(S) / ED DIAGNOSES  Final diagnoses:  Weakness  Secondary hypertension, unspecified     Phineas Semen, MD 07/25/14 1711

## 2014-07-25 NOTE — Care Management (Signed)
Per Dr. Derrill Kay patient to be discharged home.  Spoke with [place] from [facility].  Order for nursing, aide and SW.  Confirmed with patient address and phone number.  Patient states that he does not have a phone of his on and [facility] will have to speak to his sister [name] at [phone]

## 2014-07-25 NOTE — Care Management (Signed)
-   Spoke with [name] [name] CSW at [facility].  Confirmed that she set up agreement for the [employer] to pay the [employer] and spoke with [employer] and confirmed that patient's power is still off.  They stated that they had the understanding that bill was going to be turned on and had not started the eviction process - [name] [name] called [employer] to follow up.  Patient instructed to turn the main breaker off at the house, and call [employer] in order for power to be restored.  Patient provided with phone number

## 2014-07-25 NOTE — Discharge Instructions (Signed)
Please seek medical attention for any high fevers, chest pain, shortness of breath, change in behavior, persistent vomiting, bloody stool or any other new or concerning symptoms. ° °Hypertension °Hypertension, commonly called high blood pressure, is when the force of blood pumping through your arteries is too strong. Your arteries are the blood vessels that carry blood from your heart throughout your body. A blood pressure reading consists of a higher number over a lower number, such as 110/72. The higher number (systolic) is the pressure inside your arteries when your heart pumps. The lower number (diastolic) is the pressure inside your arteries when your heart relaxes. Ideally you want your blood pressure below 120/80. °Hypertension forces your heart to work harder to pump blood. Your arteries may become narrow or stiff. Having hypertension puts you at risk for heart disease, stroke, and other problems.  °RISK FACTORS °Some risk factors for high blood pressure are controllable. Others are not.  °Risk factors you cannot control include:  °· Race. You may be at higher risk if you are African American. °· Age. Risk increases with age. °· Gender. Men are at higher risk than women before age 45 years. After age 65, women are at higher risk than men. °Risk factors you can control include: °· Not getting enough exercise or physical activity. °· Being overweight. °· Getting too much fat, sugar, calories, or salt in your diet. °· Drinking too much alcohol. °SIGNS AND SYMPTOMS °Hypertension does not usually cause signs or symptoms. Extremely high blood pressure (hypertensive crisis) may cause headache, anxiety, shortness of breath, and nosebleed. °DIAGNOSIS  °To check if you have hypertension, your health care provider will measure your blood pressure while you are seated, with your arm held at the level of your heart. It should be measured at least twice using the same arm. Certain conditions can cause a difference in  blood pressure between your right and left arms. A blood pressure reading that is higher than normal on one occasion does not mean that you need treatment. If one blood pressure reading is high, ask your health care provider about having it checked again. °TREATMENT  °Treating high blood pressure includes making lifestyle changes and possibly taking medicine. Living a healthy lifestyle can help lower high blood pressure. You may need to change some of your habits. °Lifestyle changes may include: °· Following the DASH diet. This diet is high in fruits, vegetables, and whole grains. It is low in salt, red meat, and added sugars. °· Getting at least 2½ hours of brisk physical activity every week. °· Losing weight if necessary. °· Not smoking. °· Limiting alcoholic beverages. °· Learning ways to reduce stress. ° If lifestyle changes are not enough to get your blood pressure under control, your health care provider may prescribe medicine. You may need to take more than one. Work closely with your health care provider to understand the risks and benefits. °HOME CARE INSTRUCTIONS °· Have your blood pressure rechecked as directed by your health care provider.   °· Take medicines only as directed by your health care provider. Follow the directions carefully. Blood pressure medicines must be taken as prescribed. The medicine does not work as well when you skip doses. Skipping doses also puts you at risk for problems.   °· Do not smoke.   °· Monitor your blood pressure at home as directed by your health care provider.  °SEEK MEDICAL CARE IF:  °· You think you are having a reaction to medicines taken. °· You have recurrent headaches or feel   dizzy. °· You have swelling in your ankles. °· You have trouble with your vision. °SEEK IMMEDIATE MEDICAL CARE IF: °· You develop a severe headache or confusion. °· You have unusual weakness, numbness, or feel faint. °· You have severe chest or abdominal pain. °· You vomit repeatedly. °· You  have trouble breathing. °MAKE SURE YOU:  °· Understand these instructions. °· Will watch your condition. °· Will get help right away if you are not doing well or get worse. °Document Released: 01/21/2005 Document Revised: 06/07/2013 Document Reviewed: 11/13/2012 °ExitCare® Patient Information ©2015 [employer]. This information is not intended to replace advice given to you by your health care provider. Make sure you discuss any questions you have with your health care provider. ° °

## 2014-08-06 ENCOUNTER — Emergency Department: Payer: Medicaid Other

## 2014-08-06 ENCOUNTER — Encounter: Payer: Self-pay | Admitting: Emergency Medicine

## 2014-08-06 ENCOUNTER — Emergency Department
Admission: EM | Admit: 2014-08-06 | Discharge: 2014-08-06 | Disposition: A | Discharge status: Home / Self Care | Payer: Medicaid Other | Attending: Emergency Medicine | Admitting: Emergency Medicine

## 2014-08-06 DIAGNOSIS — X58XXXA Exposure to other specified factors, initial encounter: Secondary | ICD-10-CM | POA: Insufficient documentation

## 2014-08-06 DIAGNOSIS — Y9389 Activity, other specified: Secondary | ICD-10-CM | POA: Diagnosis not present

## 2014-08-06 DIAGNOSIS — E139 Other specified diabetes mellitus without complications: Secondary | ICD-10-CM | POA: Diagnosis not present

## 2014-08-06 DIAGNOSIS — I119 Hypertensive heart disease without heart failure: Secondary | ICD-10-CM | POA: Diagnosis not present

## 2014-08-06 DIAGNOSIS — Z79899 Other long term (current) drug therapy: Secondary | ICD-10-CM | POA: Insufficient documentation

## 2014-08-06 DIAGNOSIS — Y9289 Other specified places as the place of occurrence of the external cause: Secondary | ICD-10-CM | POA: Insufficient documentation

## 2014-08-06 DIAGNOSIS — Z720 Tobacco use: Secondary | ICD-10-CM | POA: Insufficient documentation

## 2014-08-06 DIAGNOSIS — M79672 Pain in left foot: Secondary | ICD-10-CM | POA: Diagnosis present

## 2014-08-06 DIAGNOSIS — Z7982 Long term (current) use of aspirin: Secondary | ICD-10-CM | POA: Diagnosis not present

## 2014-08-06 DIAGNOSIS — Z7902 Long term (current) use of antithrombotics/antiplatelets: Secondary | ICD-10-CM | POA: Diagnosis not present

## 2014-08-06 DIAGNOSIS — Y998 Other external cause status: Secondary | ICD-10-CM | POA: Insufficient documentation

## 2014-08-06 DIAGNOSIS — I422 Other hypertrophic cardiomyopathy: Secondary | ICD-10-CM | POA: Insufficient documentation

## 2014-08-06 DIAGNOSIS — S90852A Superficial foreign body, left foot, initial encounter: Secondary | ICD-10-CM | POA: Diagnosis not present

## 2014-08-06 MED ORDER — CEPHALEXIN 500 MG PO CAPS
500.0000 mg | ORAL_CAPSULE | Freq: Once | ORAL | Status: AC
  Administered 2014-08-06: 500 mg via ORAL

## 2014-08-06 MED ORDER — CEPHALEXIN 500 MG PO CAPS: 500.0000 mg | ORAL_CAPSULE | Freq: Three times a day (TID) | ORAL | Status: DC

## 2014-08-06 MED ORDER — CEPHALEXIN 500 MG PO CAPS
ORAL_CAPSULE | ORAL | Status: AC
  Administered 2014-08-06: 500 mg via ORAL
  Filled 2014-08-06: qty 1

## 2014-08-06 NOTE — ED Notes (Signed)
Pt states stepped on a thorn 1 month ago. Has a small puncture area on left heel with no redness, or swelling. It is slightly discolored and pt states feels like he has something in it. xr negative.

## 2014-08-06 NOTE — ED Notes (Signed)
Patient transported to X-ray 

## 2014-08-06 NOTE — Discharge Instructions (Signed)
We removed the very thin foreign body that was embedded in her heel. This allowed some of the pus to drain. Take Keflex as prescribed. Follow-up with her regular physician for ongoing care and he concerns. If you have redness in the foot warmth around the heel, or other urgent concerns, return to the emergency department.  Sliver Removal You have had a sliver (splinter) removed. This has caused a wound that extends through some or all layers of the skin and possibly into the subcutaneous tissue. This is the tissue just beneath the skin. Because these wounds can not be cleaned well, it is necessary to watch closely for infection. AFTER THE PROCEDURE  If a cut (incision) was necessary to remove this, it may have been repaired for you by your caregiver either with suturing, stapling, or adhesive strips. These keep together the skin edges and allow better and faster healing. HOME CARE INSTRUCTIONS   A dressing may have been applied. This may be changed once per day or as instructed. If the dressing sticks, it may be soaked off with a gauze pad or clean cloth that has been dampened with soapy water or hydrogen peroxide.  It is difficult to remove all slivers or foreign bodies as they may break or splinter into smaller pieces. Be aware that your body will work to remove the foreign substance. That is, the foreign body may work itself out of the wound. That is normal.  Watch for signs of infection and notify your caregiver if you suspect a sliver or foreign body remains in the wound.  You may have received a recommendation to follow up with your physician or a specialist. It is very important to call for or keep follow-up appointments in order to avoid infection or other complications.  Only take over-the-counter or prescription medicines for pain, discomfort, or fever as directed by your caregiver.  If antibiotics were prescribed, be sure to finish all of the medicine. If you did not receive a tetanus  shot today because you did not recall when your last one was given, check with your caregiver in the next day or two during follow up to determine if one is needed. SEEK MEDICAL CARE IF:   The area around the wound has new or worsening redness or tenderness.  Pus is coming from the wound  There is a foul smell from the wound or dressing  The edges of a wound that had been repaired break open SEEK IMMEDIATE MEDICAL CARE IF:   Red streaks are coming from the wound  An unexplained oral temperature above 102 F (38.9 C) develops. Document Released: 01/19/2000 Document Revised: 04/15/2011 Document Reviewed: 09/07/2007 [facility] Patient Information 2015 [place], [place]. This information is not intended to replace advice given to you by your health care provider. Make sure you discuss any questions you have with your health care provider.

## 2014-08-06 NOTE — ED Provider Notes (Signed)
[facility] Emergency Department Provider Note  ____________________________________________  Time seen: 2020 p.m.  I have reviewed the triage vital signs and the nursing notes.   HISTORY  Chief Complaint Foot Pain  left heel    HPI [name] [name]. is a 54 y.o. male with a history of diabetes who reports he is having discomfort in his left heel. This is after having stepped on a thorn approximately one month ago. This discomfort has been worsening over the past 4 or 5 days. He denies any fever. There is no erythema or swelling in his leg. He denies any fever.    Past Medical History  Diagnosis Date  . Hypertension   . Diabetes mellitus   . GERD (gastroesophageal reflux disease)   . Pericarditis   . Polysubstance abuse     cocaine, marijuana  . Auditory hallucinations   . Depression   . Hx of medication noncompliance   . History of suicide attempt   . Stroke February 2015    Right MCA aneurysm; head MRI - acute infarct of the right frontal lobe. Smaller acute infarct of the left frontal lobe. Thought to be due to cerebral emboli. Left PICA infarct. Small chronic lacunar infarct right internal capsule. Chronic microvascular ischemic change with white matter change.  . Coronary artery disease     No reported catheterization. Negative Myoview stress test and 2013.  Marland Kitchen Syncope and collapse   . Bipolar disorder   . Obesity, Class II, BMI 35-39.9, with comorbidity   . Hypertensive hypertrophic cardiomyopathy February 2015    Severe concentric LVH on echocardiogram.  . Aortic stenosis, mild February 2015    Patient Active Problem List   Diagnosis Date Noted  . CVA (cerebral vascular accident) 05/20/2013  . LVH (left ventricular hypertrophy) 05/20/2013  . Obesity (BMI 30-39.9) 04/10/2013  . Obesity, Class II, BMI 35-39.9, with comorbidity   . Hypertensive hypertrophic cardiomyopathy 03/07/2013  . Aortic stenosis, mild 03/07/2013  . Hypertension  01/06/2013  . Hypertensive urgency 01/05/2013  . DM (diabetes mellitus), type 2, uncontrolled 01/05/2013  . Anxiety 01/12/2012  . Depression with suicidal ideation 01/09/2012    Class: Acute  . Cocaine abuse with cocaine-induced anxiety disorder 01/09/2012    Class: Acute  . Hypertensive encephalopathy 11/07/2010  . Polysubstance abuse 11/07/2010  . DM 01/25/2009  . PSYCHIATRIC DISORDER 01/25/2009  . Essential hypertension, malignant 01/25/2009  . PERICARDIAL TAMPONADE 01/25/2009  . INSOMNIA 01/25/2009  . Shortness of breath 01/25/2009  . EPIGASTRIC PAIN 01/25/2009    Past Surgical History  Procedure Laterality Date  . Pericardial fluid drainage    . Transthoracic echocardiogram  03/23/2013    EF 5560%. Elevated left atrial pressure. Severe concentric LVH. Mild aortic stenosis. -- Appearance of hypertensive hypertrophic heart disease.  No clear cardioembolic source  . Nm myoview ltd  July 2013    No ischemia or infarct. There is inferior wall defec suggestive of diaphragmatic attenuation. Mild LV dilation at to be due to hypertensive heart disease. EF 55%. Poor exercise tolerance.  . Carotid dopplers:  03/23/2013    Interval thickening of bilateral common carotid no significant plaque formation. Tortuosity but no significant stenosis. Patent bilateral vertebral  arteries    Current Outpatient Rx  Name  Route  Sig  Dispense  Refill  . amLODipine (NORVASC) 10 MG tablet   Oral   Take 10 mg by mouth daily.         Marland Kitchen amLODipine (NORVASC) 10 MG tablet  Oral   Take 1 tablet (10 mg total) by mouth daily.   30 tablet   0   . aspirin EC 81 MG EC tablet   Oral   Take 1 tablet (81 mg total) by mouth daily.   120 tablet   0   . buPROPion (WELLBUTRIN XL) 150 MG 24 hr tablet   Oral   Take 150 mg by mouth daily.         Marland Kitchen buPROPion (WELLBUTRIN XL) 150 MG 24 hr tablet   Oral   Take 1 tablet (150 mg total) by mouth every morning.   30 tablet   0   . busPIRone (BUSPAR) 10  MG tablet   Oral   Take 10 mg by mouth 2 (two) times daily.         . busPIRone (BUSPAR) 5 MG tablet   Oral   Take 1 tablet (5 mg total) by mouth 2 (two) times daily. Patient not taking: Reported on 07/20/2014   30 tablet   0   . cephALEXin (KEFLEX) 500 MG capsule   Oral   Take 1 capsule (500 mg total) by mouth 3 (three) times daily.   21 capsule   0   . chlorthalidone (HYGROTON) 25 MG tablet   Oral   Take 1 tablet (25 mg total) by mouth daily. Patient not taking: Reported on 07/20/2014   90 tablet   3   . chlorthalidone (HYGROTON) 25 MG tablet   Oral   Take 1 tablet (25 mg total) by mouth daily.   30 tablet   0   . cloNIDine (CATAPRES) 0.2 MG tablet   Oral   Take 0.2 mg by mouth 2 (two) times daily.          . cloNIDine (CATAPRES) 0.2 MG tablet   Oral   Take 1 tablet (0.2 mg total) by mouth 2 (two) times daily.   60 tablet   0   . clopidogrel (PLAVIX) 75 MG tablet   Oral   Take 1 tablet (75 mg total) by mouth daily.   30 tablet   0   . Ibuprofen-Diphenhydramine Cit (ADVIL PM) 200-38 MG TABS   Oral   Take 1 tablet by mouth at bedtime as needed (for sleep).         Marland Kitchen lisinopril (PRINIVIL,ZESTRIL) 40 MG tablet   Oral   Take 40 mg by mouth daily.         Marland Kitchen lisinopril (PRINIVIL,ZESTRIL) 40 MG tablet   Oral   Take 1 tablet (40 mg total) by mouth daily.   30 tablet   0   . metFORMIN (GLUCOPHAGE) 500 MG tablet   Oral   Take 500 mg by mouth 2 (two) times daily.         . metFORMIN (GLUCOPHAGE) 500 MG tablet   Oral   Take 1 tablet (500 mg total) by mouth 2 (two) times daily with a meal.   60 tablet   0   . nicotine (NICODERM CQ - DOSED IN MG/24 HOURS) 14 mg/24hr patch   Transdermal   Place 1 patch (14 mg total) onto the skin daily.   28 patch   0   . pantoprazole (PROTONIX) 40 MG tablet   Oral   Take 40 mg by mouth daily.         . pantoprazole (PROTONIX) 40 MG tablet   Oral   Take 1 tablet (40 mg total) by mouth daily.   30 tablet  0   . pregabalin (LYRICA) 50 MG capsule   Oral   Take 50 mg by mouth daily.         . pregabalin (LYRICA) 50 MG capsule   Oral   Take 1 capsule (50 mg total) by mouth daily.   90 capsule   0   . traZODone (DESYREL) 150 MG tablet   Oral   Take 150 mg by mouth at bedtime.         . traZODone (DESYREL) 150 MG tablet   Oral   Take 1 tablet (150 mg total) by mouth at bedtime.   30 tablet   1     Allergies Review of patient's allergies indicates no known allergies.  Family History  Problem Relation Age of Onset  . Stroke Mother   . Diabetes type II Mother   . Hypertension Mother   . Hypertension Father   . Arthritis Father   . Arthritis Sister   . Diabetes type II Sister   . Hypertension Sister     Social History History  Substance Use Topics  . Smoking status: Current Every Day Smoker -- 0.50 packs/day for 15 years    Types: Cigarettes  . Smokeless tobacco: Not on file  . Alcohol Use: Yes     Comment: occasionally; reports 1 pint of vodka per month at paydaysocially    Review of Systems  Constitutional: Negative for fever. ENT: Negative for sore throat. Cardiovascular: Negative for chest pain. Respiratory: Negative for shortness of breath. Gastrointestinal: Negative for abdominal pain, vomiting and diarrhea. Genitourinary: Negative for dysuria. Musculoskeletal: No myalgias or injuries. Skin: Notable for a splinter in the left heel. No other acute issues. Neurological: Negative for headaches   10-point ROS otherwise negative.  ____________________________________________   PHYSICAL EXAM:  VITAL SIGNS: ED Triage Vitals  Enc Vitals Group     BP 08/06/14 1747 166/109 mmHg     Pulse Rate 08/06/14 1747 95     Resp 08/06/14 1747 20     Temp 08/06/14 1747 98.3 F (36.8 C)     Temp Source 08/06/14 1747 Oral     SpO2 08/06/14 1747 99 %     Weight 08/06/14 1747 280 lb (127.007 kg)     Height 08/06/14 1747 5\' 10"  (1.778 m)     Head Cir --       Peak Flow --      Pain Score 08/06/14 1748 9     Pain Loc --      Pain Edu? --      Excl. in GC? --     Constitutional: Alert and oriented. Well appearing and in no distress. ENT   Head: Normocephalic and atraumatic.   Nose: No congestion/rhinnorhea. Cardiovascular: Normal rate, regular rhythm, no murmur noted Respiratory:  Normal respiratory effort, no tachypnea.    Breath sounds are clear and equal bilaterally.  Gastrointestinal: Soft and nontender. No distention.  Musculoskeletal: No deformity noted. Nontender with normal range of motion in all extremities.  No noted edema. Neurologic:  Normal speech and language. No gross focal neurologic deficits are appreciated.  Skin:  Skin is warm, dry. No rash noted. There is an area towards the middle of his left heel that has a splinter in it. This is a fairly small dark splinter with an area that is somewhat blanched around it. I suspect he has a little bit of infection around the foreign body. This area is approximately 1-1-1/2 cm in diameter. It is tender to  the touch. Psychiatric: Mood and affect are normal. Speech and behavior are normal.  ____________________________________________   ____________________________________________   PROCEDURES  Splinter removal left heel:  After discussing the risks and benefits of attempting to get this small foreign body out, the patient preferred that I make an attempt to remove the splinter. I cleaned the area thoroughly and then injected 0.5 mL of 1% lidocaine into the area with a 25-gauge needle in order to anesthetize the area. This is uncomfortable for the patient but otherwise he handled fairly well. I used a #11 blade to make a very small 1 mm incision directly over the foreign body. The callus of the heel was very thick. The initial incision did not draw any blood or purulence. I attempted to grab onto the foreign body with a pair of fine pointed pickups. I was not able to grab the  foreign body with that. I took an #18-gauge needle and poked into the incision site and tried to pull the foreign body out without success, however this opened up the area a little bit more and we started to have a little purulence flow. With pressure from my fingers a larger amount of purulence came out expelling the foreign body as well. I was then able to pull the foreign body from the small incision site with the pair of pickups. This foreign body was less than a centimeter long.  The procedure was overall quite well tolerated. Having allow for some drainage of purulence and having remove the foreign body, I think she will heal significantly better. We will place the patient on Keflex.  ____________________________________________   INITIAL IMPRESSION / ASSESSMENT AND PLAN / ED COURSE  Pertinent labs & imaging results that were available during my care of the patient were reviewed by me and considered in my medical decision making (see chart for details).  Patient with evidence of a small foreign body in the left heel with likely infection and inflammation around it. The patient is a diabetic. This could turn into a worrisome lesion if we don't address now. We discussed attempted removal of this foreign body with the patient. He agrees.   ----------------------------------------- 9:20 PM on 08/06/2014 -----------------------------------------  Foreign body was successfully removed. See procedure note above. We will treat him with Keflex. I've advised the patient to soak his foot in warm water 2-3 times over the next day and to follow-up with his regular physician. He's been advised to return to the emergency department at their sign of expanding cellulitis or any other urgent concerns.  ____________________________________________   FINAL CLINICAL IMPRESSION(S) / ED DIAGNOSES  Final diagnoses:  Foreign body in left foot, initial encounter  Other specified diabetes mellitus without  complications      Darien Ramus, MD 08/06/14 2120

## 2014-08-06 NOTE — ED Notes (Signed)
Patient presents to the ED with pain to his left heel x 1 month.  Patient states he stepped on a thorn and then when he attempted to pull the thorn out part of it stayed in his foot.  Patient states foot is now quite painful.  Patient walking with a limp.  Patient reports being a diabetic.

## 2014-08-06 NOTE — ED Notes (Addendum)
Pt presents to ED with c/o left heel pain. Pt reports being a diabetic and stepping on a thorn a month ago and is unsure if he got it all out. Small lighter-colored area noted at the puncture site with a very dark/black pinpoint noted at the very center. Pt is A&O, in NAD, with s/o at bedside.

## 2014-10-19 ENCOUNTER — Observation Stay
Admission: EM | Admit: 2014-10-19 | Discharge: 2014-10-21 | Disposition: A | Discharge status: Home / Self Care | Payer: Medicaid Other | Attending: Internal Medicine | Admitting: Internal Medicine

## 2014-10-19 ENCOUNTER — Observation Stay: Payer: Medicaid Other

## 2014-10-19 ENCOUNTER — Encounter: Payer: Self-pay | Admitting: *Deleted

## 2014-10-19 ENCOUNTER — Observation Stay (HOSPITAL_BASED_OUTPATIENT_CLINIC_OR_DEPARTMENT_OTHER)
Admit: 2014-10-19 | Discharge: 2014-10-19 | Disposition: A | Discharge status: Home / Self Care | Payer: Medicaid Other | Attending: Specialist | Admitting: Specialist

## 2014-10-19 ENCOUNTER — Emergency Department: Payer: Medicaid Other

## 2014-10-19 ENCOUNTER — Other Ambulatory Visit: Payer: Self-pay

## 2014-10-19 DIAGNOSIS — Z79899 Other long term (current) drug therapy: Secondary | ICD-10-CM | POA: Diagnosis not present

## 2014-10-19 DIAGNOSIS — I422 Other hypertrophic cardiomyopathy: Secondary | ICD-10-CM | POA: Diagnosis not present

## 2014-10-19 DIAGNOSIS — I2510 Atherosclerotic heart disease of native coronary artery without angina pectoris: Secondary | ICD-10-CM | POA: Insufficient documentation

## 2014-10-19 DIAGNOSIS — R748 Abnormal levels of other serum enzymes: Secondary | ICD-10-CM | POA: Insufficient documentation

## 2014-10-19 DIAGNOSIS — E1165 Type 2 diabetes mellitus with hyperglycemia: Secondary | ICD-10-CM | POA: Diagnosis not present

## 2014-10-19 DIAGNOSIS — F209 Schizophrenia, unspecified: Secondary | ICD-10-CM | POA: Insufficient documentation

## 2014-10-19 DIAGNOSIS — F419 Anxiety disorder, unspecified: Secondary | ICD-10-CM | POA: Insufficient documentation

## 2014-10-19 DIAGNOSIS — R079 Chest pain, unspecified: Secondary | ICD-10-CM | POA: Insufficient documentation

## 2014-10-19 DIAGNOSIS — Z7902 Long term (current) use of antithrombotics/antiplatelets: Secondary | ICD-10-CM | POA: Diagnosis not present

## 2014-10-19 DIAGNOSIS — I350 Nonrheumatic aortic (valve) stenosis: Secondary | ICD-10-CM

## 2014-10-19 DIAGNOSIS — E669 Obesity, unspecified: Secondary | ICD-10-CM | POA: Diagnosis not present

## 2014-10-19 DIAGNOSIS — I639 Cerebral infarction, unspecified: Principal | ICD-10-CM | POA: Diagnosis present

## 2014-10-19 DIAGNOSIS — I674 Hypertensive encephalopathy: Secondary | ICD-10-CM | POA: Diagnosis not present

## 2014-10-19 DIAGNOSIS — Z23 Encounter for immunization: Secondary | ICD-10-CM | POA: Insufficient documentation

## 2014-10-19 DIAGNOSIS — F319 Bipolar disorder, unspecified: Secondary | ICD-10-CM | POA: Insufficient documentation

## 2014-10-19 DIAGNOSIS — I10 Essential (primary) hypertension: Secondary | ICD-10-CM | POA: Insufficient documentation

## 2014-10-19 DIAGNOSIS — K5900 Constipation, unspecified: Secondary | ICD-10-CM | POA: Insufficient documentation

## 2014-10-19 DIAGNOSIS — R42 Dizziness and giddiness: Secondary | ICD-10-CM | POA: Insufficient documentation

## 2014-10-19 DIAGNOSIS — R51 Headache: Secondary | ICD-10-CM | POA: Insufficient documentation

## 2014-10-19 DIAGNOSIS — F17210 Nicotine dependence, cigarettes, uncomplicated: Secondary | ICD-10-CM | POA: Insufficient documentation

## 2014-10-19 DIAGNOSIS — R1013 Epigastric pain: Secondary | ICD-10-CM | POA: Insufficient documentation

## 2014-10-19 DIAGNOSIS — R0602 Shortness of breath: Secondary | ICD-10-CM | POA: Insufficient documentation

## 2014-10-19 DIAGNOSIS — I314 Cardiac tamponade: Secondary | ICD-10-CM | POA: Diagnosis not present

## 2014-10-19 DIAGNOSIS — E785 Hyperlipidemia, unspecified: Secondary | ICD-10-CM | POA: Diagnosis not present

## 2014-10-19 DIAGNOSIS — Z8673 Personal history of transient ischemic attack (TIA), and cerebral infarction without residual deficits: Secondary | ICD-10-CM | POA: Insufficient documentation

## 2014-10-19 DIAGNOSIS — E1140 Type 2 diabetes mellitus with diabetic neuropathy, unspecified: Secondary | ICD-10-CM | POA: Insufficient documentation

## 2014-10-19 DIAGNOSIS — R200 Anesthesia of skin: Secondary | ICD-10-CM | POA: Diagnosis not present

## 2014-10-19 DIAGNOSIS — Z7982 Long term (current) use of aspirin: Secondary | ICD-10-CM | POA: Insufficient documentation

## 2014-10-19 DIAGNOSIS — F1410 Cocaine abuse, uncomplicated: Secondary | ICD-10-CM | POA: Insufficient documentation

## 2014-10-19 DIAGNOSIS — K219 Gastro-esophageal reflux disease without esophagitis: Secondary | ICD-10-CM | POA: Insufficient documentation

## 2014-10-19 DIAGNOSIS — Z794 Long term (current) use of insulin: Secondary | ICD-10-CM | POA: Insufficient documentation

## 2014-10-19 DIAGNOSIS — Z6835 Body mass index (BMI) 35.0-35.9, adult: Secondary | ICD-10-CM | POA: Insufficient documentation

## 2014-10-19 DIAGNOSIS — G4700 Insomnia, unspecified: Secondary | ICD-10-CM | POA: Diagnosis not present

## 2014-10-19 DIAGNOSIS — R519 Headache, unspecified: Secondary | ICD-10-CM

## 2014-10-19 DIAGNOSIS — I071 Rheumatic tricuspid insufficiency: Secondary | ICD-10-CM | POA: Diagnosis not present

## 2014-10-19 LAB — URINALYSIS COMPLETE WITH MICROSCOPIC (ARMC ONLY)
BACTERIA UA: NONE SEEN
Bilirubin Urine: NEGATIVE
Glucose, UA: 50 mg/dL — AB
HGB URINE DIPSTICK: NEGATIVE
Ketones, ur: NEGATIVE mg/dL
LEUKOCYTES UA: NEGATIVE
Nitrite: NEGATIVE
PH: 5 (ref 5.0–8.0)
PROTEIN: NEGATIVE mg/dL
RBC / HPF: NONE SEEN RBC/hpf (ref 0–5)
Specific Gravity, Urine: 1.028 (ref 1.005–1.030)

## 2014-10-19 LAB — COMPREHENSIVE METABOLIC PANEL
ALBUMIN: 4.1 g/dL (ref 3.5–5.0)
ALK PHOS: 55 U/L (ref 38–126)
ALT: 12 U/L — ABNORMAL LOW (ref 17–63)
ANION GAP: 7 (ref 5–15)
AST: 20 U/L (ref 15–41)
BILIRUBIN TOTAL: 0.9 mg/dL (ref 0.3–1.2)
BUN: 27 mg/dL — ABNORMAL HIGH (ref 6–20)
CALCIUM: 9.5 mg/dL (ref 8.9–10.3)
CO2: 28 mmol/L (ref 22–32)
Chloride: 104 mmol/L (ref 101–111)
Creatinine, Ser: 1.34 mg/dL — ABNORMAL HIGH (ref 0.61–1.24)
GFR calc Af Amer: >60 mL/min (ref >60)
GFR, EST NON AFRICAN AMERICAN: 59 mL/min — AB (ref >60)
GLUCOSE: 194 mg/dL — AB (ref 65–99)
POTASSIUM: 3.6 mmol/L (ref 3.5–5.1)
Sodium: 139 mmol/L (ref 135–145)
TOTAL PROTEIN: 7.3 g/dL (ref 6.5–8.1)

## 2014-10-19 LAB — CBC WITH DIFFERENTIAL/PLATELET
BASOS PCT: 1 %
Basophils Absolute: 0.1 10*3/uL (ref 0–0.1)
Eosinophils Absolute: 0.6 10*3/uL (ref 0–0.7)
Eosinophils Relative: 5 %
HEMATOCRIT: 44 % (ref 40.0–52.0)
HEMOGLOBIN: 15.3 g/dL (ref 13.0–18.0)
LYMPHS PCT: 23 %
Lymphs Abs: 2.6 10*3/uL (ref 1.0–3.6)
MCH: 31.7 pg (ref 26.0–34.0)
MCHC: 34.7 g/dL (ref 32.0–36.0)
MCV: 91.3 fL (ref 80.0–100.0)
MONO ABS: 0.9 10*3/uL (ref 0.2–1.0)
MONOS PCT: 8 %
NEUTROS ABS: 7.2 10*3/uL — AB (ref 1.4–6.5)
NEUTROS PCT: 63 %
Platelets: 187 10*3/uL (ref 150–440)
RBC: 4.82 MIL/uL (ref 4.40–5.90)
RDW: 12.6 % (ref 11.5–14.5)
WBC: 11.4 10*3/uL — ABNORMAL HIGH (ref 3.8–10.6)

## 2014-10-19 LAB — GLUCOSE, CAPILLARY
GLUCOSE-CAPILLARY: 242 mg/dL — AB (ref 65–99)
Glucose-Capillary: 214 mg/dL — ABNORMAL HIGH (ref 65–99)
Glucose-Capillary: 180 mg/dL — ABNORMAL HIGH (ref 65–99)
Glucose-Capillary: 168 mg/dL — ABNORMAL HIGH (ref 65–99)

## 2014-10-19 LAB — TROPONIN I
TROPONIN I: 0.03 ng/mL (ref <0.031)
TROPONIN I: 0.03 ng/mL (ref <0.031)
TROPONIN I: 0.03 ng/mL (ref <0.031)
Troponin I: 0.04 ng/mL — ABNORMAL HIGH (ref <0.031)

## 2014-10-19 MED ORDER — ASPIRIN 81 MG PO CHEW
324.0000 mg | CHEWABLE_TABLET | Freq: Once | ORAL | Status: AC
  Administered 2014-10-19: 324 mg via ORAL

## 2014-10-19 MED ORDER — ACETAMINOPHEN 650 MG RE SUPP: 650.0000 mg | Freq: Four times a day (QID) | RECTAL | Status: DC | PRN

## 2014-10-19 MED ORDER — AMLODIPINE BESYLATE 10 MG PO TABS
10.0000 mg | ORAL_TABLET | Freq: Every day | ORAL | Status: DC
  Administered 2014-10-20 – 2014-10-21 (×2): 10 mg via ORAL
  Filled 2014-10-19 (×2): qty 1

## 2014-10-19 MED ORDER — BUSPIRONE HCL 5 MG PO TABS
10.0000 mg | ORAL_TABLET | Freq: Two times a day (BID) | ORAL | Status: DC
  Administered 2014-10-19 – 2014-10-21 (×4): 10 mg via ORAL
  Filled 2014-10-19 (×4): qty 2

## 2014-10-19 MED ORDER — ASPIRIN 81 MG PO CHEW
CHEWABLE_TABLET | ORAL | Status: AC
  Filled 2014-10-19: qty 4

## 2014-10-19 MED ORDER — INSULIN ASPART 100 UNIT/ML ~~LOC~~ SOLN: 0.0000 [IU] | Freq: Every day | SUBCUTANEOUS | Status: DC

## 2014-10-19 MED ORDER — BUPROPION HCL ER (XL) 150 MG PO TB24
150.0000 mg | ORAL_TABLET | Freq: Every day | ORAL | Status: DC
  Administered 2014-10-20 – 2014-10-21 (×2): 150 mg via ORAL
  Filled 2014-10-19 (×2): qty 1

## 2014-10-19 MED ORDER — SODIUM CHLORIDE 0.9 % IJ SOLN
3.0000 mL | Freq: Two times a day (BID) | INTRAMUSCULAR | Status: DC
  Administered 2014-10-19 – 2014-10-20 (×4): 3 mL via INTRAVENOUS

## 2014-10-19 MED ORDER — INFLUENZA VAC SPLIT QUAD 0.5 ML IM SUSY
0.5000 mL | PREFILLED_SYRINGE | INTRAMUSCULAR | Status: AC
  Administered 2014-10-20: 0.5 mL via INTRAMUSCULAR
  Filled 2014-10-19: qty 0.5

## 2014-10-19 MED ORDER — TRAZODONE HCL 100 MG PO TABS
100.0000 mg | ORAL_TABLET | Freq: Every day | ORAL | Status: DC
  Administered 2014-10-19 – 2014-10-20 (×2): 100 mg via ORAL
  Filled 2014-10-19 (×2): qty 1

## 2014-10-19 MED ORDER — ACETAMINOPHEN 325 MG PO TABS
650.0000 mg | ORAL_TABLET | Freq: Four times a day (QID) | ORAL | Status: DC | PRN
Start: 2014-10-19 — End: 2014-10-21
  Administered 2014-10-20 – 2014-10-21 (×3): 650 mg via ORAL
  Filled 2014-10-19 (×3): qty 2

## 2014-10-19 MED ORDER — ONDANSETRON HCL 4 MG/2ML IJ SOLN: 4.0000 mg | Freq: Four times a day (QID) | INTRAMUSCULAR | Status: DC | PRN

## 2014-10-19 MED ORDER — DOCUSATE SODIUM 100 MG PO CAPS
100.0000 mg | ORAL_CAPSULE | Freq: Two times a day (BID) | ORAL | Status: DC
  Administered 2014-10-19 – 2014-10-21 (×5): 100 mg via ORAL
  Filled 2014-10-19 (×5): qty 1

## 2014-10-19 MED ORDER — INSULIN ASPART 100 UNIT/ML ~~LOC~~ SOLN
10.0000 [IU] | Freq: Two times a day (BID) | SUBCUTANEOUS | Status: DC
  Administered 2014-10-19 – 2014-10-21 (×4): 10 [IU] via SUBCUTANEOUS
  Filled 2014-10-19 (×4): qty 10

## 2014-10-19 MED ORDER — ASPIRIN EC 81 MG PO TBEC
81.0000 mg | DELAYED_RELEASE_TABLET | Freq: Every day | ORAL | Status: DC
  Administered 2014-10-20 – 2014-10-21 (×2): 81 mg via ORAL
  Filled 2014-10-19 (×2): qty 1

## 2014-10-19 MED ORDER — INSULIN ASPART 100 UNIT/ML ~~LOC~~ SOLN
0.0000 [IU] | Freq: Three times a day (TID) | SUBCUTANEOUS | Status: DC
  Administered 2014-10-19 (×2): 5 [IU] via SUBCUTANEOUS
  Administered 2014-10-20: 2 [IU] via SUBCUTANEOUS
  Administered 2014-10-20: 18:00:00 5 [IU] via SUBCUTANEOUS
  Administered 2014-10-20: 08:00:00 3 [IU] via SUBCUTANEOUS
  Administered 2014-10-21: 08:00:00 5 [IU] via SUBCUTANEOUS
  Filled 2014-10-19 (×3): qty 5
  Filled 2014-10-19: qty 2
  Filled 2014-10-19: qty 3
  Filled 2014-10-19: qty 5

## 2014-10-19 MED ORDER — LISINOPRIL 20 MG PO TABS
40.0000 mg | ORAL_TABLET | Freq: Every day | ORAL | Status: DC
  Administered 2014-10-20 – 2014-10-21 (×2): 40 mg via ORAL
  Filled 2014-10-19 (×2): qty 2

## 2014-10-19 MED ORDER — PANTOPRAZOLE SODIUM 40 MG PO TBEC
40.0000 mg | DELAYED_RELEASE_TABLET | Freq: Every day | ORAL | Status: DC
  Administered 2014-10-20 – 2014-10-21 (×2): 40 mg via ORAL
  Filled 2014-10-19 (×2): qty 1

## 2014-10-19 MED ORDER — CLONIDINE HCL 0.1 MG PO TABS
0.2000 mg | ORAL_TABLET | Freq: Two times a day (BID) | ORAL | Status: DC
  Administered 2014-10-19 – 2014-10-21 (×4): 0.2 mg via ORAL
  Filled 2014-10-19 (×4): qty 2

## 2014-10-19 MED ORDER — DICLOFENAC SODIUM 1 % TD GEL
2.0000 g | Freq: Four times a day (QID) | TRANSDERMAL | Status: DC
  Administered 2014-10-19 – 2014-10-20 (×3): 2 g via TOPICAL
  Filled 2014-10-19: qty 100

## 2014-10-19 MED ORDER — CHLORTHALIDONE 25 MG PO TABS
25.0000 mg | ORAL_TABLET | Freq: Every day | ORAL | Status: DC
  Administered 2014-10-20 – 2014-10-21 (×2): 25 mg via ORAL
  Filled 2014-10-19 (×2): qty 1

## 2014-10-19 MED ORDER — ACETAMINOPHEN 500 MG PO TABS
1000.0000 mg | ORAL_TABLET | Freq: Once | ORAL | Status: AC
  Administered 2014-10-19: 1000 mg via ORAL
  Filled 2014-10-19: qty 2

## 2014-10-19 MED ORDER — ENOXAPARIN SODIUM 40 MG/0.4ML ~~LOC~~ SOLN
40.0000 mg | SUBCUTANEOUS | Status: DC
  Administered 2014-10-19 – 2014-10-20 (×2): 40 mg via SUBCUTANEOUS
  Filled 2014-10-19 (×3): qty 0.4

## 2014-10-19 MED ORDER — CLOPIDOGREL BISULFATE 75 MG PO TABS
75.0000 mg | ORAL_TABLET | Freq: Every day | ORAL | Status: DC
  Administered 2014-10-20 – 2014-10-21 (×2): 75 mg via ORAL
  Filled 2014-10-19 (×2): qty 1

## 2014-10-19 MED ORDER — ONDANSETRON HCL 4 MG PO TABS: 4.0000 mg | ORAL_TABLET | Freq: Four times a day (QID) | ORAL | Status: DC | PRN

## 2014-10-19 NOTE — Progress Notes (Signed)
Dr. Cherlynn Kaiser notified of pts request to see psychiatrist at this time.  Dr. Cherlynn Kaiser did not order Psych consult at this time. Casimer Leek, RN. 10/19/2014  2:34 PM

## 2014-10-19 NOTE — Clinical Social Work Note (Signed)
Clinical Social Work Assessment  Patient Details  Name: [name] [name]. MRN: [id number] Date of Birth: [birth year]  Date of referral:  10/19/14               Reason for consult:  Mental Health Concerns, Substance Use/ETOH Abuse, Transportation                Permission sought to share information with:  Other ([facility]) Permission granted to share information::  Yes, Verbal Permission Granted  Name::      Programmer, systems::   [facility]  Relationship::     Contact Information:     Housing/Transportation Living arrangements for the past 2 months:  Single Family Home Source of Information:  Patient Patient Interpreter Needed:  None Criminal Activity/Legal Involvement Pertinent to Current Situation/Hospitalization:  No - Comment as needed Significant Relationships:  Other(Comment) (Sponsors from [employer]. and 36 year old father) Lives with:  Self Do you feel safe going back to the place where you live?  Yes Need for family participation in patient care:  No (Coment)  Care giving concerns: Patient lives alone and reports that he is independent.    Social Worker assessment / plan: Holiday representative (CSW) received consult from MD for transportation assistance. CSW met with patient alone at bedside. Patient was alert and oriented and answered questions appropriately. CSW introduced self and explained role of CSW department. Patient reported that he lives alone in [place] subsidized housing. Per patient he receives $730 in SSI per month and is in the caps program. Patient reported that he does not have a car but utilizes [employer] (physical sciences) and his sponsors for transportation. Patient reported that he has a history of alcohol abuse however he has not been drinking for 9 months now. Patient reported that he goes to [employer] on a regular basis. Patient is resourceful and knowns how to utilize available resources. Patient reported that he has had several falls recently because his blood pressure medicine is making him  drowsy, while he is trying to do things around the house like cook and clean. Patient reported that he has anxiety and bipolar disorder. Per patient he has been admitted to the BMU 3 times. Patient reported that sometimes his light bill is high and things get tight financially. Patient also asked about getting an in home aide. Patient denied drug use and reported that he is trying to quit smoking cigarettes. Patient identified his sponsors as his main support system. Patient also reported that he goes to church and has an 74 year old father. Patient also stated that he has 3 children.    CSW provided patient with [facility] including [facility] and the quit line. CSW also left a voicemail with Lanae Boast from [facility] asking him to visit patient and start services. CSW explained to patient that an in home aide will be an out of pocket expense and gave him phone numbers to the [facility] office.    Employment status:  Disabled (Comment on whether or not currently receiving Disability) Insurance information:  Medicaid In [place] PT Recommendations:  Not assessed at this time Information / Referral to community resources:  Other (Comment Required) [facility] )  Patient/Family's Response to care: Patient is agreeable to going home and meeting with Lanae Boast from [facility].   Patient/Family's Understanding of and Emotional Response to Diagnosis, Current Treatment, and Prognosis: Patient was pleasant and thanked CSW for visit and providing resources.   Emotional Assessment Appearance:  Appears stated age Attitude/Demeanor/Rapport:  Affect (typically observed):  Accepting, Adaptable, Pleasant Orientation:  Oriented to Self, Oriented to Place, Oriented to  Time, Oriented to Situation Alcohol / Substance use:  Tobacco Use, Alcohol Use Psych involvement (Current and /or in the community):  No (Comment)  Discharge Needs  Concerns to be addressed:  Financial / Insurance Concerns Readmission within the  last 30 days:  No Current discharge risk:  Psychiatric Illness Barriers to Discharge:  Continued Medical Work up   Elwyn Reach 10/19/2014, 3:47 PM

## 2014-10-19 NOTE — Plan of Care (Signed)
Problem: Discharge/Transitional Outcomes Goal: Barriers To Progression Addressed/Resolved Individualization of Care Pt prefers to be called [name] [name] Hx of HTN, DM, GERD, PERICARDITIS, POLYSUBSTANCE ABUSE, DEPRESSION, SUICIDE ATTEMPT, CVA, CAD, SYNCOPE, BIPOLAR, HTN HYPERTROPHIC CARDIOMYOPATHY, AORITC STENOSIS, MULTIPLE FALLS ON HOME MEDICATIONS HIGH FALL PRECAUTIONS

## 2014-10-19 NOTE — ED Notes (Signed)
Triponin level 0.04. MD aware.

## 2014-10-19 NOTE — H&P (Signed)
[facility] - [place] at [facility]   PATIENT NAME: [name] [name]    MR#:  [id number]  DATE OF BIRTH:  1960-05-17  DATE OF ADMISSION:  10/19/2014  PRIMARY CARE PHYSICIAN: Preston Fleeting, MD   REQUESTING/REFERRING PHYSICIAN: Dr. Chari Manning  CHIEF COMPLAINT:   Chief Complaint  Patient presents with  . Headache   headache and left arm and left leg numbness  HISTORY OF PRESENT ILLNESS:  [name] [name]  is a 54 y.o. male with a known history of diabetes, hypertension, GERD, history of polysubstance abuse, history of previous CVA, depression, bipolar disorder, who presents to the hospital due to a headache and also left hand and left foot numbness. Patient says he's had a headache now for about a week located in the left side of his head and nonradiating. It is not associated with blurry vision, lacrimation, nausea, vomiting. He admits to some left hand and foot numbness which is apparently new for the patient. He therefore came to the ER for further evaluation. Hospitalist services were contacted for admission for possible CVA. Patient was also noted to have an elevated troponin with some ST changes on EKG but the patient denies any chest pain, nausea, vomiting, diaphoresis. He admits to some shortness of breath which seems to be chronic for the patient.  PAST MEDICAL HISTORY:   Past Medical History  Diagnosis Date  . Hypertension   . Diabetes mellitus   . GERD (gastroesophageal reflux disease)   . Pericarditis   . Polysubstance abuse     cocaine, marijuana  . Auditory hallucinations   . Depression   . Hx of medication noncompliance   . History of suicide attempt   . Stroke February 2015    Right MCA aneurysm; head MRI - acute infarct of the right frontal lobe. Smaller acute infarct of the left frontal lobe. Thought to be due to cerebral emboli. Left PICA infarct. Small chronic lacunar infarct right internal capsule. Chronic microvascular ischemic  change with white matter change.  . Coronary artery disease     No reported catheterization. Negative Myoview stress test and 2013.  Marland Kitchen Syncope and collapse   . Bipolar disorder   . Obesity, Class II, BMI 35-39.9, with comorbidity   . Hypertensive hypertrophic cardiomyopathy February 2015    Severe concentric LVH on echocardiogram.  . Aortic stenosis, mild February 2015    PAST SURGICAL HISTORY:   Past Surgical History  Procedure Laterality Date  . Pericardial fluid drainage    . Transthoracic echocardiogram  03/23/2013    EF 5560%. Elevated left atrial pressure. Severe concentric LVH. Mild aortic stenosis. -- Appearance of hypertensive hypertrophic heart disease.  No clear cardioembolic source  . Nm myoview ltd  July 2013    No ischemia or infarct. There is inferior wall defec suggestive of diaphragmatic attenuation. Mild LV dilation at to be due to hypertensive heart disease. EF 55%. Poor exercise tolerance.  . Carotid dopplers:  03/23/2013    Interval thickening of bilateral common carotid no significant plaque formation. Tortuosity but no significant stenosis. Patent bilateral vertebral  arteries    SOCIAL HISTORY:   Social History  Substance Use Topics  . Smoking status: Current Every Day Smoker -- 0.50 packs/day for 15 years    Types: Cigarettes  . Smokeless tobacco: Not on file  . Alcohol Use: Yes     Comment: occasionally; reports 1 pint of vodka per month at paydaysocially    FAMILY HISTORY:   Family History  Problem Relation Age of Onset  . Stroke Mother   . Diabetes type II Mother   . Hypertension Mother   . Hypertension Father   . Arthritis Father   . Arthritis Sister   . Diabetes type II Sister   . Hypertension Sister     DRUG ALLERGIES:  No Known Allergies  REVIEW OF SYSTEMS:   Review of Systems  Constitutional: Negative for fever and weight loss.  HENT: Negative for congestion, nosebleeds and tinnitus.   Eyes: Negative for blurred vision, double  vision and redness.  Respiratory: Negative for cough, hemoptysis and shortness of breath.   Cardiovascular: Negative for chest pain, orthopnea, leg swelling and PND.  Gastrointestinal: Negative for nausea, vomiting, abdominal pain, diarrhea and melena.  Genitourinary: Negative for dysuria, urgency and hematuria.  Musculoskeletal: Negative for joint pain and falls.  Neurological: Positive for tingling (left hand and left foot), sensory change (left hand and left foot numbness) and headaches. Negative for dizziness, focal weakness, seizures and weakness.  Endo/Heme/Allergies: Negative for polydipsia. Does not bruise/bleed easily.  Psychiatric/Behavioral: Negative for depression and memory loss. The patient is not nervous/anxious.     MEDICATIONS AT HOME:   Prior to Admission medications   Medication Sig Start Date End Date Taking? Authorizing Provider  amLODipine (NORVASC) 10 MG tablet Take 10 mg by mouth daily.   Yes Historical Provider, MD  aspirin EC 81 MG EC tablet Take 1 tablet (81 mg total) by mouth daily. 07/21/14  Yes Sital Mody, MD  buPROPion (WELLBUTRIN XL) 150 MG 24 hr tablet Take 150 mg by mouth daily.   Yes Historical Provider, MD  busPIRone (BUSPAR) 10 MG tablet Take 10 mg by mouth 2 (two) times daily.   Yes Historical Provider, MD  chlorthalidone (HYGROTON) 25 MG tablet Take 1 tablet (25 mg total) by mouth daily. 07/25/14  Yes Phineas Semen, MD  cloNIDine (CATAPRES) 0.2 MG tablet Take 0.2 mg by mouth 2 (two) times daily.    Yes Sanjuana Kava, NP  clopidogrel (PLAVIX) 75 MG tablet Take 1 tablet (75 mg total) by mouth daily. 07/21/14  Yes Adrian Saran, MD  diclofenac sodium (VOLTAREN) 1 % GEL Apply 2 g topically 4 (four) times daily.   Yes Historical Provider, MD  insulin aspart (NOVOLOG) 100 UNIT/ML injection Inject 10 Units into the skin 2 (two) times daily with a meal.   Yes Historical Provider, MD  lisinopril (PRINIVIL,ZESTRIL) 40 MG tablet Take 40 mg by mouth daily.   Yes  Historical Provider, MD  metFORMIN (GLUCOPHAGE) 500 MG tablet Take 500 mg by mouth 2 (two) times daily.   Yes Historical Provider, MD  pantoprazole (PROTONIX) 40 MG tablet Take 40 mg by mouth daily.   Yes Historical Provider, MD  traZODone (DESYREL) 100 MG tablet Take 100 mg by mouth at bedtime.   Yes Historical Provider, MD      VITAL SIGNS:  Blood pressure 118/80, pulse 56, temperature 97.9 F (36.6 C), resp. rate 13, height 5\' 11"  (1.803 m), weight 127.007 kg (280 lb), SpO2 96 %.  PHYSICAL EXAMINATION:  Physical Exam  GENERAL:  54 y.o.-year-old patient lying in the bed with no acute distress.  EYES: Pupils equal, round, reactive to light and accommodation. No scleral icterus. Extraocular muscles intact.  HEENT: Head atraumatic, normocephalic. Oropharynx and nasopharynx clear. No oropharyngeal erythema, moist oral mucosa  NECK:  Supple, no jugular venous distention. No thyroid enlargement, no tenderness.  LUNGS: Normal breath sounds bilaterally, no wheezing, rales, rhonchi. No use of  accessory muscles of respiration.  CARDIOVASCULAR: S1, S2 RRR. 2/6 systolic ejection murmur heard at the base, no rubs, gallops, clicks.  ABDOMEN: Soft, nontender, nondistended. Bowel sounds present. No organomegaly or mass.  EXTREMITIES: No pedal edema, cyanosis, or clubbing. + 2 pedal & radial pulses b/l.   NEUROLOGIC: Cranial nerves II through XII are intact. No focal motor deficits appreciated bilaterally. Positive sensory change in left hand and left leg. PSYCHIATRIC: The patient is alert and oriented x 3. Good affect.  SKIN: No obvious rash, lesion, or ulcer.   LABORATORY PANEL:   CBC  Recent Labs Lab 10/19/14 0952  WBC 11.4*  HGB 15.3  HCT 44.0  PLT 187   ------------------------------------------------------------------------------------------------------------------  Chemistries   Recent Labs Lab 10/19/14 0952  NA 139  K 3.6  CL 104  CO2 28  GLUCOSE 194*  BUN 27*  CREATININE  1.34*  CALCIUM 9.5  AST 20  ALT 12*  ALKPHOS 55  BILITOT 0.9   ------------------------------------------------------------------------------------------------------------------  Cardiac Enzymes  Recent Labs Lab 10/19/14 0952  TROPONINI 0.04*   ------------------------------------------------------------------------------------------------------------------  RADIOLOGY:  Ct Head Wo Contrast  10/19/2014   CLINICAL DATA:  Left side frontal headache for 1 week.  EXAM: CT HEAD WITHOUT CONTRAST  TECHNIQUE: Contiguous axial images were obtained from the base of the skull through the vertex without intravenous contrast.  COMPARISON:  07/20/2014  FINDINGS: Old left cerebellar infarct. Chronic microvascular changes throughout the deep white matter. No acute intracranial abnormality. Specifically, no hemorrhage, hydrocephalus, mass lesion, acute infarction, or significant intracranial injury. No acute calvarial abnormality. Visualized paranasal sinuses and mastoids clear. Orbital soft tissues unremarkable.  IMPRESSION: Old left cerebellar infarct.  No acute intracranial abnormality.   Electronically Signed   By: Charlett Nose M.D.   On: 10/19/2014 10:51   Dg Chest Portable 1 View  10/19/2014   CLINICAL DATA:  Headache for 1 week. History of hypertension, coronary artery disease and diabetes. Initial encounter.  EXAM: PORTABLE CHEST - 1 VIEW  COMPARISON:  07/21/2014.  FINDINGS: 1039 hours. The heart size and mediastinal contours are normal. The lungs are clear. There is no pleural effusion or pneumothorax. No acute osseous findings are identified. Telemetry leads overlie the chest.  IMPRESSION: Stable chest.  No active cardiopulmonary process.   Electronically Signed   By: Carey Bullocks M.D.   On: 10/19/2014 10:54     IMPRESSION AND PLAN:   54 year old male with past medical history of diabetes, hypertension, depression, history of previous CVA, diabetic neuropathy, history of bipolar disorder,  GERD, who presented to the hospital due to a headache and left hand and foot numbness.  #1 acute CVA/TIA-this is a suspected diagnosis given the patient's acute symptoms of left hand and foot numbness along with a headache. His CT head on admission does not show any evidence of acute CVA. Old stroke. -I will get MRI of the brain, carotid duplex, and two-dimensional echo. -Continue aspirin, Plavix. Follow every 2 hours neuro checks 4.  #2 elevated troponin-observe him on telemetry, cycle cardiac markers. -Continue aspirin, Plavix  #3 hypertension-continue Norvasc, chlorthalidone, clonidine, lisinopril.  #4 depression-continue BuSpar, Wellbutrin, trazodone.  #5 GERD-continue Protonix.  #6 diabetes type 2 without complication-continue NovoLog sliding scale. Hold metformin.   All the records are reviewed and case discussed with ED provider. Management plans discussed with the patient, family and they are in agreement.  CODE STATUS: Full  TOTAL TIME TAKING CARE OF THIS PATIENT: 45 minutes.    Houston Siren M.D on 10/19/2014 at 12:07  PM  Between 7am to 6pm - Pager - [phone]  After 6pm go to [email or web address] - password [email or web address]  [facility]  Office  [phone]  CC: Primary care physician; Preston Fleeting, MD

## 2014-10-19 NOTE — ED Notes (Signed)
Pt given sandwich tray and water, okay by Dr. Inocencio Homes

## 2014-10-19 NOTE — ED Notes (Signed)
Ems for headache since wed.

## 2014-10-19 NOTE — ED Provider Notes (Signed)
[facility] Emergency Department Provider Note  ____________________________________________  Time seen: Approximately 9:57 AM  I have reviewed the triage vital signs and the nursing notes.   HISTORY  Chief Complaint Headache    HPI [name] Daleon Willinger. is a 54 y.o. male with hypertension, diabetes, coronary artery disease, GERD, polysubstance abuse, prior CVA, bipolar disorder and schizophrenia who presents for evaluation of one week constant gradual onset left-sided headache. He reports he was taking "Epsom salts" for constipation but they made his headache worse. He reports usually has these headaches when his blood pressure is high however he has been taking his medications and his blood pressure has improved but the headache persists. He is also felt dizzy intermittently. No fevers, no neck pain or neck stiffness. No head trauma. No vision change. He is complaining of new numbness in the left hand which started 2 days ago.No chest pain or difficulty breathing.   Past Medical History  Diagnosis Date  . Hypertension   . Diabetes mellitus   . GERD (gastroesophageal reflux disease)   . Pericarditis   . Polysubstance abuse     cocaine, marijuana  . Auditory hallucinations   . Depression   . Hx of medication noncompliance   . History of suicide attempt   . Stroke February 2015    Right MCA aneurysm; head MRI - acute infarct of the right frontal lobe. Smaller acute infarct of the left frontal lobe. Thought to be due to cerebral emboli. Left PICA infarct. Small chronic lacunar infarct right internal capsule. Chronic microvascular ischemic change with white matter change.  . Coronary artery disease     No reported catheterization. Negative Myoview stress test and 2013.  Marland Kitchen Syncope and collapse   . Bipolar disorder   . Obesity, Class II, BMI 35-39.9, with comorbidity   . Hypertensive hypertrophic cardiomyopathy February 2015    Severe concentric LVH on  echocardiogram.  . Aortic stenosis, mild February 2015    Patient Active Problem List   Diagnosis Date Noted  . CVA (cerebral vascular accident) 05/20/2013  . LVH (left ventricular hypertrophy) 05/20/2013  . Obesity (BMI 30-39.9) 04/10/2013  . Obesity, Class II, BMI 35-39.9, with comorbidity   . Hypertensive hypertrophic cardiomyopathy 03/07/2013  . Aortic stenosis, mild 03/07/2013  . Hypertension 01/06/2013  . Hypertensive urgency 01/05/2013  . DM (diabetes mellitus), type 2, uncontrolled 01/05/2013  . Anxiety 01/12/2012  . Depression with suicidal ideation 01/09/2012    Class: Acute  . Cocaine abuse with cocaine-induced anxiety disorder 01/09/2012    Class: Acute  . Hypertensive encephalopathy 11/07/2010  . Polysubstance abuse 11/07/2010  . DM 01/25/2009  . PSYCHIATRIC DISORDER 01/25/2009  . Essential hypertension, malignant 01/25/2009  . PERICARDIAL TAMPONADE 01/25/2009  . INSOMNIA 01/25/2009  . Shortness of breath 01/25/2009  . EPIGASTRIC PAIN 01/25/2009    Past Surgical History  Procedure Laterality Date  . Pericardial fluid drainage    . Transthoracic echocardiogram  03/23/2013    EF 5560%. Elevated left atrial pressure. Severe concentric LVH. Mild aortic stenosis. -- Appearance of hypertensive hypertrophic heart disease.  No clear cardioembolic source  . Nm myoview ltd  July 2013    No ischemia or infarct. There is inferior wall defec suggestive of diaphragmatic attenuation. Mild LV dilation at to be due to hypertensive heart disease. EF 55%. Poor exercise tolerance.  . Carotid dopplers:  03/23/2013    Interval thickening of bilateral common carotid no significant plaque formation. Tortuosity but no significant stenosis. Patent bilateral vertebral  arteries    Current Outpatient Rx  Name  Route  Sig  Dispense  Refill  . amLODipine (NORVASC) 10 MG tablet   Oral   Take 10 mg by mouth daily.         Marland Kitchen amLODipine (NORVASC) 10 MG tablet   Oral   Take 1 tablet (10  mg total) by mouth daily.   30 tablet   0   . aspirin EC 81 MG EC tablet   Oral   Take 1 tablet (81 mg total) by mouth daily.   120 tablet   0   . buPROPion (WELLBUTRIN XL) 150 MG 24 hr tablet   Oral   Take 150 mg by mouth daily.         Marland Kitchen buPROPion (WELLBUTRIN XL) 150 MG 24 hr tablet   Oral   Take 1 tablet (150 mg total) by mouth every morning.   30 tablet   0   . busPIRone (BUSPAR) 10 MG tablet   Oral   Take 10 mg by mouth 2 (two) times daily.         . busPIRone (BUSPAR) 5 MG tablet   Oral   Take 1 tablet (5 mg total) by mouth 2 (two) times daily. Patient not taking: Reported on 07/20/2014   30 tablet   0   . cephALEXin (KEFLEX) 500 MG capsule   Oral   Take 1 capsule (500 mg total) by mouth 3 (three) times daily.   21 capsule   0   . chlorthalidone (HYGROTON) 25 MG tablet   Oral   Take 1 tablet (25 mg total) by mouth daily. Patient not taking: Reported on 07/20/2014   90 tablet   3   . chlorthalidone (HYGROTON) 25 MG tablet   Oral   Take 1 tablet (25 mg total) by mouth daily.   30 tablet   0   . cloNIDine (CATAPRES) 0.2 MG tablet   Oral   Take 0.2 mg by mouth 2 (two) times daily.          . cloNIDine (CATAPRES) 0.2 MG tablet   Oral   Take 1 tablet (0.2 mg total) by mouth 2 (two) times daily.   60 tablet   0   . clopidogrel (PLAVIX) 75 MG tablet   Oral   Take 1 tablet (75 mg total) by mouth daily.   30 tablet   0   . Ibuprofen-Diphenhydramine Cit (ADVIL PM) 200-38 MG TABS   Oral   Take 1 tablet by mouth at bedtime as needed (for sleep).         Marland Kitchen lisinopril (PRINIVIL,ZESTRIL) 40 MG tablet   Oral   Take 40 mg by mouth daily.         Marland Kitchen lisinopril (PRINIVIL,ZESTRIL) 40 MG tablet   Oral   Take 1 tablet (40 mg total) by mouth daily.   30 tablet   0   . metFORMIN (GLUCOPHAGE) 500 MG tablet   Oral   Take 500 mg by mouth 2 (two) times daily.         . metFORMIN (GLUCOPHAGE) 500 MG tablet   Oral   Take 1 tablet (500 mg total) by  mouth 2 (two) times daily with a meal.   60 tablet   0   . nicotine (NICODERM CQ - DOSED IN MG/24 HOURS) 14 mg/24hr patch   Transdermal   Place 1 patch (14 mg total) onto the skin daily.   28 patch   0   .  pantoprazole (PROTONIX) 40 MG tablet   Oral   Take 40 mg by mouth daily.         . pantoprazole (PROTONIX) 40 MG tablet   Oral   Take 1 tablet (40 mg total) by mouth daily.   30 tablet   0   . pregabalin (LYRICA) 50 MG capsule   Oral   Take 50 mg by mouth daily.         . pregabalin (LYRICA) 50 MG capsule   Oral   Take 1 capsule (50 mg total) by mouth daily.   90 capsule   0   . traZODone (DESYREL) 150 MG tablet   Oral   Take 150 mg by mouth at bedtime.         . traZODone (DESYREL) 150 MG tablet   Oral   Take 1 tablet (150 mg total) by mouth at bedtime.   30 tablet   1     Allergies Review of patient's allergies indicates no known allergies.  Family History  Problem Relation Age of Onset  . Stroke Mother   . Diabetes type II Mother   . Hypertension Mother   . Hypertension Father   . Arthritis Father   . Arthritis Sister   . Diabetes type II Sister   . Hypertension Sister     Social History Social History  Substance Use Topics  . Smoking status: Current Every Day Smoker -- 0.50 packs/day for 15 years    Types: Cigarettes  . Smokeless tobacco: None  . Alcohol Use: Yes     Comment: occasionally; reports 1 pint of vodka per month at paydaysocially    Review of Systems Constitutional: No fever/chills Eyes: No visual changes. ENT: No sore throat. Cardiovascular: Denies chest pain. Respiratory: Denies shortness of breath. Gastrointestinal: No abdominal pain.  No nausea, no vomiting.  No diarrhea.  No constipation. Genitourinary: Negative for dysuria. Musculoskeletal: Negative for back pain. Skin: Negative for rash. Neurological: Positive for headache, no focal weakness, positive for left hand numbness.  10-point ROS otherwise  negative.  ____________________________________________   PHYSICAL EXAM:  VITAL SIGNS: ED Triage Vitals  Enc Vitals Group     BP 10/19/14 0943 129/89 mmHg     Pulse Rate 10/19/14 0943 65     Resp 10/19/14 0943 18     Temp 10/19/14 0942 97.9 F (36.6 C)     Temp Source 10/19/14 0942 Oral     SpO2 10/19/14 0943 98 %     Weight 10/19/14 0943 280 lb (127.007 kg)     Height 10/19/14 0943 5\' 11"  (1.803 m)     Head Cir --      Peak Flow --      Pain Score 10/19/14 0944 9     Pain Loc --      Pain Edu? --      Excl. in GC? --     Constitutional: Alert and oriented. Well appearing and in no acute distress. Eyes: Conjunctivae are normal. PERRL. EOMI. Head: Atraumatic. Nose: No congestion/rhinnorhea. Mouth/Throat: Mucous membranes are moist.  Oropharynx non-erythematous. Neck: No stridor.   Cardiovascular: Normal rate, regular rhythm. Grossly normal heart sounds.  Good peripheral circulation. Respiratory: Normal respiratory effort.  No retractions. Lungs CTAB. Gastrointestinal: Soft and nontender. No distention. No abdominal bruits. No CVA tenderness. Genitourinary: deferred Musculoskeletal: No lower extremity tenderness nor edema.  No joint effusions. Neurologic:  Normal speech and language.  No gait instability. 5 out of 5 strength in bilateral upper  and lower extremities, decreased sensation to light touch in the left arm and leg but sensation otherwise intact, cranial nerves II through XII intact. Skin:  Skin is warm, dry and intact. No rash noted. Psychiatric: Mood and affect are normal. Speech and behavior are normal.  ____________________________________________   LABS (all labs ordered are listed, but only abnormal results are displayed)  Labs Reviewed  CBC WITH DIFFERENTIAL/PLATELET - Abnormal; Notable for the following:    WBC 11.4 (*)    Neutro Abs 7.2 (*)    All other components within normal limits  COMPREHENSIVE METABOLIC PANEL - Abnormal; Notable for the  following:    Glucose, Bld 194 (*)    BUN 27 (*)    Creatinine, Ser 1.34 (*)    ALT 12 (*)    GFR calc non Af Amer 59 (*)    All other components within normal limits  TROPONIN I - Abnormal; Notable for the following:    Troponin I 0.04 (*)    All other components within normal limits  URINALYSIS COMPLETEWITH MICROSCOPIC ([facility] ONLY)   ____________________________________________  EKG  ED ECG REPORT I, Gayla Doss, the attending physician, personally viewed and interpreted this ECG.   Date: 10/19/2014  EKG Time: 10:00  Rate: 57  Rhythm: sinus bradycardia  Axis: left axis  Intervals:none  ST&T Change: No acute ST elevation. Q-wave in lead 3, V2, V3, T-wave inversion in V4, V5, V6, lead 1 and lead 2.  ____________________________________________  RADIOLOGY  CT head  FINDINGS: Old left cerebellar infarct. Chronic microvascular changes throughout the deep white matter. No acute intracranial abnormality. Specifically, no hemorrhage, hydrocephalus, mass lesion, acute infarction, or significant intracranial injury. No acute calvarial abnormality. Visualized paranasal sinuses and mastoids clear. Orbital soft tissues unremarkable.  IMPRESSION: Old left cerebellar infarct. No acute intracranial abnormality.   CXR IMPRESSION: Stable chest. No active cardiopulmonary process.  ____________________________________________   PROCEDURES  Procedure(s) performed: None  Critical Care performed: No  ____________________________________________   INITIAL IMPRESSION / ASSESSMENT AND PLAN / ED COURSE  Pertinent labs & imaging results that were available during my care of the patient were reviewed by me and considered in my medical decision making (see chart for details).  [name] Lowry Bala. is a 54 y.o. male with hypertension, diabetes, coronary artery disease, GERD, polysubstance abuse, prior CVA, bipolar disorder and schizophrenia who presents for evaluation of one  week constant gradual onset left-sided headache as well as dizziness and new left arm numbness without weakness. He denies any chest pain or difficulty breathing at this moment or recently however EKG is concerning for ischemia with T-wave inversions in V4, V5, V6, lead 1 which are new compared to his prior EKG. I discussed the case with Dr. Herbie Baltimore of cardiology who has reviewed the EKG with less than 1 mm of ST elevation in V1 and V2 and reports this change is likely consistent with repolarization abnormality and does not reflect STEMI. He does have objective sensory deficit in the left upper extremity, less so in the left lower extremity which she reports is new. My concern is for recurrent CVA. He is outside of the time window forTPA as his symptoms have been going on for  2 days.  ----------------------------------------- 11:23 AM on 10/19/2014 -----------------------------------------  Labs notable for mild leukocytosis. Mild stable creatinine elevation with creatinine 1.34. Chest x-ray clear. CT head with old left cerebellar infarct which would not account for his current neurological findings. Troponin is mildly elevated 0.04 however this appears chronic.  Aspirin ordered. Discussed with hospitalist Dr. Quentin Cornwall at this time for admission. ____________________________________________   FINAL CLINICAL IMPRESSION(S) / ED DIAGNOSES  Final diagnoses:  Acute nonintractable headache, unspecified headache type  Cerebral infarction due to unspecified mechanism      Gayla Doss, MD 10/19/14 1125

## 2014-10-20 ENCOUNTER — Observation Stay: Payer: Medicaid Other

## 2014-10-20 LAB — GLUCOSE, CAPILLARY
GLUCOSE-CAPILLARY: 167 mg/dL — AB (ref 65–99)
GLUCOSE-CAPILLARY: 136 mg/dL — AB (ref 65–99)
GLUCOSE-CAPILLARY: 300 mg/dL — AB (ref 65–99)
GLUCOSE-CAPILLARY: 160 mg/dL — AB (ref 65–99)
Glucose-Capillary: 453 mg/dL — ABNORMAL HIGH (ref 65–99)
Glucose-Capillary: 216 mg/dL — ABNORMAL HIGH (ref 65–99)

## 2014-10-20 LAB — CBC
HCT: 41.1 % (ref 40.0–52.0)
HEMOGLOBIN: 14.3 g/dL (ref 13.0–18.0)
MCH: 32.4 pg (ref 26.0–34.0)
MCHC: 34.8 g/dL (ref 32.0–36.0)
MCV: 93.2 fL (ref 80.0–100.0)
Platelets: 168 10*3/uL (ref 150–440)
RBC: 4.41 MIL/uL (ref 4.40–5.90)
RDW: 12.5 % (ref 11.5–14.5)
WBC: 9.8 10*3/uL (ref 3.8–10.6)

## 2014-10-20 LAB — BASIC METABOLIC PANEL
ANION GAP: 8 (ref 5–15)
BUN: 23 mg/dL — ABNORMAL HIGH (ref 6–20)
CALCIUM: 9 mg/dL (ref 8.9–10.3)
CO2: 26 mmol/L (ref 22–32)
Chloride: 103 mmol/L (ref 101–111)
Creatinine, Ser: 1.29 mg/dL — ABNORMAL HIGH (ref 0.61–1.24)
Glucose, Bld: 173 mg/dL — ABNORMAL HIGH (ref 65–99)
POTASSIUM: 3.5 mmol/L (ref 3.5–5.1)
Sodium: 137 mmol/L (ref 135–145)

## 2014-10-20 MED ORDER — ATORVASTATIN CALCIUM 20 MG PO TABS
20.0000 mg | ORAL_TABLET | Freq: Every day | ORAL | Status: DC
  Administered 2014-10-20: 20 mg via ORAL
  Filled 2014-10-20: qty 1

## 2014-10-20 MED ORDER — LORAZEPAM 2 MG/ML IJ SOLN
0.5000 mg | Freq: Once | INTRAMUSCULAR | Status: AC
  Administered 2014-10-20: 16:00:00 0.5 mg via INTRAVENOUS
  Filled 2014-10-20: qty 1

## 2014-10-20 NOTE — Progress Notes (Signed)
Initial Nutrition Assessment   INTERVENTION:   Meals and Snacks: Cater to patient preferences, will send afternoon snack Education: RD provided pt with "General Healthy Nutrition Therapy" from the [employer]. RD educated pt on healthy options with emphasis on heart healthy, low sodium and diabetic nutrition therapy. Pt reports not cooking at home PTA secondary to h/o stroke and is afraid with a gas stove/oven he might forget to turn it off. Pt reports using microwave or fixing cold meals instead, such as sandwiches, cereal, or convenience foods. RD provided examples of healthy options with such parameters. Pt also reports having constipation often, therefore RD also recommend higher fiber foods such as wheat bread and fresh fruits/vegetables when able. Teach back method used.  Expect good compliance.   NUTRITION DIAGNOSIS:   Predicted suboptimal nutrient intake related to chronic illness, social / environmental circumstances as evidenced by per patient/family report.  GOAL:   Patient will meet greater than or equal to 90% of their needs  MONITOR:    (Energy Intake, Glucose Profile, Digestive System, Anthropometrics)  REASON FOR ASSESSMENT:   Malnutrition Screening Tool, Consult Assessment of nutrition requirement/status  ASSESSMENT:   Pt admitted with headache found to have had CVA.   Past Medical History  Diagnosis Date  . Hypertension   . Diabetes mellitus   . GERD (gastroesophageal reflux disease)   . Pericarditis   . Polysubstance abuse     cocaine, marijuana  . Auditory hallucinations   . Depression   . Hx of medication noncompliance   . History of suicide attempt   . Stroke February 2015    Right MCA aneurysm; head MRI - acute infarct of the right frontal lobe. Smaller acute infarct of the left frontal lobe. Thought to be due to cerebral emboli. Left PICA infarct. Small chronic lacunar infarct right internal capsule. Chronic microvascular  ischemic change with white matter change.  . Coronary artery disease     No reported catheterization. Negative Myoview stress test and 2013.  Marland Kitchen Syncope and collapse   . Bipolar disorder   . Obesity, Class II, BMI 35-39.9, with comorbidity   . Hypertensive hypertrophic cardiomyopathy February 2015    Severe concentric LVH on echocardiogram.  . Aortic stenosis, mild February 2015     Diet Order:  Diet heart healthy/carb modified Room service appropriate?: Yes; Fluid consistency:: Thin   Current Nutrition: Pt reports eating 100% of breakfast this am.   Food/Nutrition-Related History: Pt reports good appetite at home. Pt reports eating convenience foods secondary to stroke. Pt reports liking cereal or Nutrigrain cereal bars in the morning, sandwiches for lunch and microwave dinners later. Pt repports drinking mostly water sometimes soda. Pt had diet Pepsi in room on visit.    Medications: Colace, Novolog, Protonix  Electrolyte/Renal Profile and Glucose Profile:   Recent Labs Lab 10/19/14 0952 10/20/14 0512  NA 139 137  K 3.6 3.5  CL 104 103  CO2 28 26  BUN 27* 23*  CREATININE 1.34* 1.29*  CALCIUM 9.5 9.0  GLUCOSE 194* 173*   Protein Profile:  Recent Labs Lab 10/19/14 0952  ALBUMIN 4.1    Gastrointestinal Profile: Last BM: this past Sunday per pt (9/11)    Nutrition-Focused Physical Exam Findings: Nutrition-Focused physical exam completed. Findings are WDL for fat depletion, muscle depletion, and edema.     Weight Change: Pt reports weight loss. Pt reports thinking he weighed 300lbs maybe 4 months ago. RD weighed pt on visit at 246.1lbs. Per [facility] pt  weight 239lbs 07/22/2014 and 275lbs April 2015.   Height:   Ht Readings from Last 1 Encounters:  10/20/14  (1.803 m)    Weight:   Wt Readings from Last 1 Encounters:  10/20/14 246 lb 1.6 oz (111.63 kg)    Ideal Body Weight:   78kg  BMI:  Body mass index is 34.34 kg/(m^2).  Estimated Nutritional Needs:    Kcal:  BEE: 1476kcals, TEE: (IF 1.0-1.2)(AF 1.3) 2128-2302kcals, using IBW of 78kg  Protein:  62-78g protein (0.8-1.0g/kg) using IBW of 78kg  Fluid:  1950-2351mL of fluid (25-68mL/kg) using IBW of 78kg  EDUCATION NEEDS:   Education needs addressed   MODERATE Care Level  Leda Quail, RD, LDN Pager [phone]

## 2014-10-20 NOTE — Plan of Care (Signed)
Problem: Discharge/Transitional Outcomes Goal: Other Discharge Outcomes/Goals Outcome: Progressing Plan of care progress to goal:  Patient c/o pain to right shoulder and mid back - relief noted with Tylenol. Cardiac monitoring continues. Patient to have MRI. No changes in neuro assessment this shift.

## 2014-10-20 NOTE — Plan of Care (Signed)
Problem: Discharge/Transitional Outcomes Goal: Barriers To Progression Addressed/Resolved Individualization of Care Outcome: Progressing Individualization of Care Pt prefers to be called [name] [name] Hx of HTN, DM, GERD, Pericarditis, polysubstance abuse, auditory hallucinations, depression, medicinal noncompliance, stroke, CAD, syncope & collapse, bipolar, obesity, aortic stenosis.      Goal: Other Discharge Outcomes/Goals Education:  Stroke education booklet and symptom and medicinal education given this shift. Hemodynamically:  Patient afebrile and VSS this shift. BP 116/73 mmHg  Pulse 57  Temp(Src) 97.6 F (36.4 C) (Oral)  Resp 20  Ht  (1.803 m)  Wt 280 lb (127.007 kg)  BMI 39.07 kg/m2  SpO2 97% Mobility/Activity:  Patient up and steady on feet to bathroom this shift.  Bed alarm on for safety as he is high fall risk from several falls at home. Pain:  Patient without complaints of pain this shift. Diet:  Patient on carb modified/cardiac diet.  Patient CBG was high during day shift.  Patient requested sandwich tray multiple times during shift.  He was given sandwich tray once and then given sugar free jello and sugar free ice cream as snacks.  BS was with sliding scale range this shift and no coverage was needed. Medication:  Patient states that he has trouble obtaining medications due to finances at times.  Care management aware.   Neuro:  Neuro checks given every two hours until 2:00 a.m. And then progressed to every four hours.  No change in neuro this shift.

## 2014-10-20 NOTE — Progress Notes (Addendum)
Patient ID: [name] [name]., male   DOB: [birth year], 54 y.o.   MRN: [id number] [facility] PROGRESS NOTE  PCP: Preston Fleeting, MD  HPI/Subjective: Patient not feeling well when he is taking all these medications in the morning. He cannot tell me which medication. He is having headaches. Total body weakness. Patient states that he's had weakness on the left side from his prior stroke.  Objective: Filed Vitals:   10/20/14 1000  BP: 139/90  Pulse: 62  Temp: 98.4 F (36.9 C)  Resp: 18    Filed Weights   10/19/14 0943 10/20/14 1100  Weight: 127.007 kg (280 lb) 111.63 kg (246 lb 1.6 oz)    ROS: Review of Systems  Constitutional: Negative for fever and chills.  Eyes: Negative for blurred vision.  Respiratory: Negative for cough and shortness of breath.   Cardiovascular: Negative for chest pain.  Gastrointestinal: Negative for nausea, vomiting, diarrhea and constipation.  Genitourinary: Negative for dysuria.  Musculoskeletal: Negative for joint pain.  Neurological: Positive for focal weakness, weakness and headaches. Negative for dizziness.   Exam: Physical Exam  Constitutional: He is oriented to person, place, and time.  HENT:  Nose: No mucosal edema.  Mouth/Throat: No oropharyngeal exudate or posterior oropharyngeal edema.  Eyes: Conjunctivae, EOM and lids are normal. Pupils are equal, round, and reactive to light.  Neck: No JVD present. Carotid bruit is not present. No edema present. No thyroid mass and no thyromegaly present.  Cardiovascular: S1 normal and S2 normal.  Exam reveals no gallop.   No murmur heard. Pulses:      Dorsalis pedis pulses are 2+ on the right side, and 2+ on the left side.  Respiratory: No respiratory distress. He has no wheezes. He has no rhonchi. He has no rales.  GI: Soft. Bowel sounds are normal. There is no tenderness.  Musculoskeletal:       Right ankle: He exhibits no swelling.       Left ankle: He exhibits no swelling.   Lymphadenopathy:    He has no cervical adenopathy.  Neurological: He is alert and oriented to person, place, and time.  Power 4+ out of 5 left lower extremity, 5 out of 5 left upper extremity. 5 out of 5 right upper and lower extremity. Numbness fingers left hand. Slurred speech from previous stroke.  Skin: Skin is warm. No rash noted. Nails show no clubbing.  Psychiatric: He has a normal mood and affect.    Data Reviewed: Basic Metabolic Panel:  Recent Labs Lab 10/19/14 0952 10/20/14 0512  NA 139 137  K 3.6 3.5  CL 104 103  CO2 28 26  GLUCOSE 194* 173*  BUN 27* 23*  CREATININE 1.34* 1.29*  CALCIUM 9.5 9.0   Liver Function Tests:  Recent Labs Lab 10/19/14 0952  AST 20  ALT 12*  ALKPHOS 55  BILITOT 0.9  PROT 7.3  ALBUMIN 4.1   CBC:  Recent Labs Lab 10/19/14 0952 10/20/14 0512  WBC 11.4* 9.8  NEUTROABS 7.2*  --   HGB 15.3 14.3  HCT 44.0 41.1  MCV 91.3 93.2  PLT 187 168   CBG:  Recent Labs Lab 10/19/14 1617 10/19/14 2008 10/19/14 2257 10/20/14 0711 10/20/14 1038  GLUCAP 214* 180* 168* 167* 136*    Studies: Ct Head Wo Contrast  10/19/2014   CLINICAL DATA:  Left side frontal headache for 1 week.  EXAM: CT HEAD WITHOUT CONTRAST  TECHNIQUE: Contiguous axial images were obtained from the base of the skull through  the vertex without intravenous contrast.  COMPARISON:  07/20/2014  FINDINGS: Old left cerebellar infarct. Chronic microvascular changes throughout the deep white matter. No acute intracranial abnormality. Specifically, no hemorrhage, hydrocephalus, mass lesion, acute infarction, or significant intracranial injury. No acute calvarial abnormality. Visualized paranasal sinuses and mastoids clear. Orbital soft tissues unremarkable.  IMPRESSION: Old left cerebellar infarct.  No acute intracranial abnormality.   Electronically Signed   By: Charlett Nose M.D.   On: 10/19/2014 10:51   US Carotid Bilateral  10/19/2014   CLINICAL DATA:  55 year old male with  a history of cerebral vascular accident.  Cardiovascular risk factors include hypertension, stroke/ TIA, tobacco use, diabetes.  EXAM: BILATERAL CAROTID DUPLEX ULTRASOUND  TECHNIQUE: Wallace Cullens scale imaging, color Doppler and duplex ultrasound were performed of bilateral carotid and vertebral arteries in the neck.  COMPARISON:  CTA neck 03/23/2013  FINDINGS: Criteria: Quantification of carotid stenosis is based on velocity parameters that correlate the residual internal carotid diameter with NASCET-based stenosis levels, using the diameter of the distal internal carotid lumen as the denominator for stenosis measurement.  The following velocity measurements were obtained:  RIGHT  ICA:  Systolic 66 cm/sec, Diastolic 25 cm/sec  CCA:  101 cm/sec  SYSTOLIC ICA/CCA RATIO:  0.7  ECA:  130 cm/sec  LEFT  ICA:  Systolic 49 cm/sec, Diastolic 13 cm/sec  CCA:  95 cm/sec  SYSTOLIC ICA/CCA RATIO:  0.5  ECA:  129 cm/sec  Right Brachial SBP: Not acquired  Left Brachial SBP: Not acquired  RIGHT CAROTID ARTERY: No significant calcified disease of the right common carotid artery. Intermediate waveform maintained. Heterogeneous plaque without significant calcifications at the right carotid bifurcation. Low resistance waveform of the right ICA. No significant tortuosity.  RIGHT VERTEBRAL ARTERY: Antegrade flow with low resistance waveform.  LEFT CAROTID ARTERY: No significant calcified disease of the left common carotid artery. Intermediate waveform maintained. Heterogeneous plaque at the left carotid bifurcation without significant calcifications. Low resistance waveform of the left ICA.  LEFT VERTEBRAL ARTERY:  Antegrade flow with low resistance waveform.  IMPRESSION: Color duplex indicates minimal heterogeneous plaque, with no hemodynamically significant stenosis by duplex criteria in the extracranial cerebrovascular circulation.  Signed,  Yvone Neu. Loreta Ave, DO  [facility]  [facility]    Electronically Signed   By: Gilmer Mor D.O.   On: 10/19/2014 17:00   Dg Chest Portable 1 View  10/19/2014   CLINICAL DATA:  Headache for 1 week. History of hypertension, coronary artery disease and diabetes. Initial encounter.  EXAM: PORTABLE CHEST - 1 VIEW  COMPARISON:  07/21/2014.  FINDINGS: 1039 hours. The heart size and mediastinal contours are normal. The lungs are clear. There is no pleural effusion or pneumothorax. No acute osseous findings are identified. Telemetry leads overlie the chest.  IMPRESSION: Stable chest.  No active cardiopulmonary process.   Electronically Signed   By: Carey Bullocks M.D.   On: 10/19/2014 10:54    Scheduled Meds: . amLODipine  10 mg Oral Daily  . aspirin EC  81 mg Oral Daily  . atorvastatin  20 mg Oral q1800  . buPROPion  150 mg Oral Daily  . busPIRone  10 mg Oral BID  . chlorthalidone  25 mg Oral Daily  . cloNIDine  0.2 mg Oral BID  . clopidogrel  75 mg Oral Daily  . diclofenac sodium  2 g Topical QID  . docusate sodium  100 mg Oral BID  . enoxaparin (LOVENOX) injection  40 mg Subcutaneous Q24H  .  insulin aspart  0-15 Units Subcutaneous TID WC  . insulin aspart  0-5 Units Subcutaneous QHS  . insulin aspart  10 Units Subcutaneous BID WC  . lisinopril  40 mg Oral Daily  . LORazepam  0.5 mg Intravenous Once  . pantoprazole  40 mg Oral Daily  . sodium chloride  3 mL Intravenous Q12H  . traZODone  100 mg Oral QHS   Assessment/Plan:  1. Suspected CVA- I really ordered an MRI of the brain. Hopefully this can be done today. Continue aspirin and Plavix. Add a statin. 2. Headaches- could be secondary to blood pressure stroke continue to monitor. 3. Essential hypertension- blood pressure is been very variable since being here up and down. Continue to monitor on current medications 4. Diabetes mellitus type 2- patient is on sliding scale 5. Gastroesophageal reflux disease without esophagitis on Protonix  Code Status:     Code Status Orders         Start     Ordered   10/19/14 1142  Full code   Continuous     10/19/14 1148     Disposition Plan: likely home tomorrow  Time spent: 25 minutes  Alford Highland  [facility]

## 2014-10-20 NOTE — Care Management (Signed)
Admitted to [facility] with the diagnosis of CVA. Lives alone. Sister is Elray Mcgregor [phone]). Last seen Dr. Lovie Chol [facility] last week. No home Health. No skilled facility. No home oxygen. Uses a cane to help with balance and ambulation. Gets CAP services 2hours/day every day. Gets medications thru [employer] on [address] delivered to his home, Vincenza Hews is his sponsor, doesn't remember where Vincenza Hews works, but has his telephone number, Larey Seat at home yesterday after feeling dizzy. Good appetite. States he takes care of all activities of daily living himself. Doesn't cook, microwaves his food, Sponsor helps with errands. Gwenette Greet RN MSN Care Management [phone]

## 2014-10-20 NOTE — Progress Notes (Signed)
[name] [name] from [facility] contacted patient and arranged services to start next Friday. Patient is agreeable and appeared motivated to begin services. Clinical Social Worker (CSW) will continue to follow and assist as needed.   Jetta Lout, LCSWA [phone]

## 2014-10-21 LAB — LIPID PANEL
CHOL/HDL RATIO: 5.6 ratio
CHOLESTEROL: 124 mg/dL (ref 0–200)
HDL: 22 mg/dL — AB (ref >40)
LDL Cholesterol: 72 mg/dL (ref 0–99)
Triglycerides: 149 mg/dL (ref <150)
VLDL: 30 mg/dL (ref 0–40)

## 2014-10-21 LAB — GLUCOSE, CAPILLARY
GLUCOSE-CAPILLARY: 178 mg/dL — AB (ref 65–99)
Glucose-Capillary: 233 mg/dL — ABNORMAL HIGH (ref 65–99)

## 2014-10-21 MED ORDER — POLYETHYLENE GLYCOL 3350 17 G PO PACK
17.0000 g | PACK | Freq: Every day | ORAL | Status: DC
  Administered 2014-10-21: 08:00:00 17 g via ORAL
  Filled 2014-10-21: qty 1

## 2014-10-21 MED ORDER — ATORVASTATIN CALCIUM 20 MG PO TABS: 20.0000 mg | ORAL_TABLET | Freq: Every day | ORAL | Status: DC

## 2014-10-21 MED ORDER — TRAZODONE HCL 100 MG PO TABS: 100.0000 mg | ORAL_TABLET | Freq: Every day | ORAL | Status: DC

## 2014-10-21 NOTE — Progress Notes (Signed)
Clinical Child psychotherapist (CSW) provided a taxi voucher to patient. Patient attempted to call his sponsor with no success. Patient reported that he would figure out how to get his medications once he gets home. RN aware of above.   Jetta Lout, LCSWA [phone]

## 2014-10-21 NOTE — Progress Notes (Signed)
Harvey from [facility] met with patient today and explained services with [facility]. Patient is medically stable for D/C home today. Please reconsult if future social work needs arise. CSW signing off.   Bailey Morgan, LCSWA [phone] 

## 2014-10-21 NOTE — Discharge Summary (Signed)
[facility] - [place] at [facility]   PATIENT NAME: [name] [name]    MR#:  [id number]  DATE OF BIRTH:  [birth year]  DATE OF ADMISSION:  10/19/2014 ADMITTING PHYSICIAN: Houston Siren, MD  DATE OF DISCHARGE: 10/21/2014  PRIMARY CARE PHYSICIAN: Preston Fleeting, MD    ADMISSION DIAGNOSIS:  CVA (cerebral vascular accident) [I63.9] CVA (cerebral infarction) [I63.9] Cerebral infarction due to unspecified mechanism [I63.9] Acute nonintractable headache, unspecified headache type [R51]  DISCHARGE DIAGNOSIS:  Active Problems:   CVA (cerebral infarction)   SECONDARY DIAGNOSIS:   Past Medical History  Diagnosis Date  . Hypertension   . Diabetes mellitus   . GERD (gastroesophageal reflux disease)   . Pericarditis   . Polysubstance abuse     cocaine, marijuana  . Auditory hallucinations   . Depression   . Hx of medication noncompliance   . History of suicide attempt   . Stroke February 2015    Right MCA aneurysm; head MRI - acute infarct of the right frontal lobe. Smaller acute infarct of the left frontal lobe. Thought to be due to cerebral emboli. Left PICA infarct. Small chronic lacunar infarct right internal capsule. Chronic microvascular ischemic change with white matter change.  . Coronary artery disease     No reported catheterization. Negative Myoview stress test and 2013.  Marland Kitchen Syncope and collapse   . Bipolar disorder   . Obesity, Class II, BMI 35-39.9, with comorbidity   . Hypertensive hypertrophic cardiomyopathy February 2015    Severe concentric LVH on echocardiogram.  . Aortic stenosis, mild February 2015    HOSPITAL COURSE:   1. Headache. Patient was admitted with suspected stroke. MRI of the brain was negative for acute stroke. It did have findings of prior stroke which is consistent with his history. I did add a statin. Patient is already on aspirin. 2. Essential hypertension- blood pressure better today continue usual  medications. 3. Diabetes mellitus- continue his usual medication regimen at home 4. Gastroesophageal reflux disease without esophagitis continue PPI 5. Depression- no changes in psychiatric medication  DISCHARGE CONDITIONS:   Satisfactory  CONSULTS OBTAINED:  None  DRUG ALLERGIES:  No Known Allergies  DISCHARGE MEDICATIONS:   Current Discharge Medication List    START taking these medications   Details  atorvastatin (LIPITOR) 20 MG tablet Take 1 tablet (20 mg total) by mouth daily at 6 PM. Qty: 30 tablet, Refills: 0      CONTINUE these medications which have NOT CHANGED   Details  amLODipine (NORVASC) 10 MG tablet Take 10 mg by mouth daily.    aspirin EC 81 MG EC tablet Take 1 tablet (81 mg total) by mouth daily. Qty: 120 tablet, Refills: 0    buPROPion (WELLBUTRIN XL) 150 MG 24 hr tablet Take 150 mg by mouth daily.    busPIRone (BUSPAR) 10 MG tablet Take 10 mg by mouth 2 (two) times daily.    chlorthalidone (HYGROTON) 25 MG tablet Take 1 tablet (25 mg total) by mouth daily. Qty: 30 tablet, Refills: 0    cloNIDine (CATAPRES) 0.2 MG tablet Take 0.2 mg by mouth 2 (two) times daily.     clopidogrel (PLAVIX) 75 MG tablet Take 1 tablet (75 mg total) by mouth daily. Qty: 30 tablet, Refills: 0    diclofenac sodium (VOLTAREN) 1 % GEL Apply 2 g topically 4 (four) times daily.    insulin aspart (NOVOLOG) 100 UNIT/ML injection Inject 10 Units into the skin 2 (two) times daily with a meal.  lisinopril (PRINIVIL,ZESTRIL) 40 MG tablet Take 40 mg by mouth daily.    metFORMIN (GLUCOPHAGE) 500 MG tablet Take 500 mg by mouth 2 (two) times daily.    pantoprazole (PROTONIX) 40 MG tablet Take 40 mg by mouth daily.    traZODone (DESYREL) 100 MG tablet Take 100 mg by mouth at bedtime.         DISCHARGE INSTRUCTIONS:   Follow-up with PMD one week  If you experience worsening of your admission symptoms, develop shortness of breath, life threatening emergency, suicidal or  homicidal thoughts you must seek medical attention immediately by calling 911 or calling your MD immediately  if symptoms less severe.  You Must read complete instructions/literature along with all the possible adverse reactions/side effects for all the Medicines you take and that have been prescribed to you. Take any new Medicines after you have completely understood and accept all the possible adverse reactions/side effects.   Please note  You were cared for by a hospitalist during your hospital stay. If you have any questions about your discharge medications or the care you received while you were in the hospital after you are discharged, you can call the unit and asked to speak with the hospitalist on call if the hospitalist that took care of you is not available. Once you are discharged, your primary care physician will handle any further medical issues. Please note that NO REFILLS for any discharge medications will be authorized once you are discharged, as it is imperative that you return to your primary care physician (or establish a relationship with a primary care physician if you do not have one) for your aftercare needs so that they can reassess your need for medications and monitor your lab values.    Today   CHIEF COMPLAINT:   Chief Complaint  Patient presents with  . Headache    HISTORY OF PRESENT ILLNESS:  [name] [name]  is a 54 y.o. male with a known history of CVA. He presented with headache. Was admitted for workup of acute stroke.   VITAL SIGNS:  Blood pressure 128/76, pulse 59, temperature 98.1 F (36.7 C), temperature source Oral, resp. rate 20, height 5\' 11"  (1.803 m), weight 111.63 kg (246 lb 1.6 oz), SpO2 97 %.  I/O:   Intake/Output Summary (Last 24 hours) at 10/21/14 0734 Last data filed at 10/21/14 0981  Gross per 24 hour  Intake    720 ml  Output   2125 ml  Net  -1405 ml    PHYSICAL EXAMINATION:  GENERAL:  54 y.o.-year-old patient lying in the bed with  no acute distress.  EYES: Pupils equal, round, reactive to light and accommodation. No scleral icterus. Extraocular muscles intact.  HEENT: Head atraumatic, normocephalic. Oropharynx and nasopharynx clear.  NECK:  Supple, no jugular venous distention. No thyroid enlargement, no tenderness.  LUNGS: Normal breath sounds bilaterally, no wheezing, rales,rhonchi or crepitation. No use of accessory muscles of respiration.  CARDIOVASCULAR: S1, S2 normal. No murmurs, rubs, or gallops.  ABDOMEN: Soft, non-tender, non-distended. Bowel sounds present. No organomegaly or mass.  EXTREMITIES: No pedal edema, cyanosis, or clubbing.  NEUROLOGIC: Patient has some baseline slurred speech. Power seems 5 out of 5 in upper and lower extremities PSYCHIATRIC: The patient is alert and oriented x 3.  SKIN: No obvious rash, lesion, or ulcer.   DATA REVIEW:   CBC  Recent Labs Lab 10/20/14 0512  WBC 9.8  HGB 14.3  HCT 41.1  PLT 168    Chemistries  Recent Labs Lab 10/19/14 0952 10/20/14 0512  NA 139 137  K 3.6 3.5  CL 104 103  CO2 28 26  GLUCOSE 194* 173*  BUN 27* 23*  CREATININE 1.34* 1.29*  CALCIUM 9.5 9.0  AST 20  --   ALT 12*  --   ALKPHOS 55  --   BILITOT 0.9  --     Cardiac Enzymes  Recent Labs Lab 10/19/14 1954  TROPONINI 0.03    Microbiology Results  Results for orders placed or performed during the hospital encounter of 01/04/13  MRSA PCR Screening     Status: None   Collection Time: 01/05/13  3:22 AM  Result Value Ref Range Status   MRSA by PCR NEGATIVE NEGATIVE Final    Comment:        The GeneXpert MRSA Assay (FDA approved for NASAL specimens only), is one component of a comprehensive MRSA colonization surveillance program. It is not intended to diagnose MRSA infection nor to guide or monitor treatment for MRSA infections.    RADIOLOGY:  Ct Head Wo Contrast  10/19/2014   CLINICAL DATA:  Left side frontal headache for 1 week.  EXAM: CT HEAD WITHOUT CONTRAST   TECHNIQUE: Contiguous axial images were obtained from the base of the skull through the vertex without intravenous contrast.  COMPARISON:  07/20/2014  FINDINGS: Old left cerebellar infarct. Chronic microvascular changes throughout the deep white matter. No acute intracranial abnormality. Specifically, no hemorrhage, hydrocephalus, mass lesion, acute infarction, or significant intracranial injury. No acute calvarial abnormality. Visualized paranasal sinuses and mastoids clear. Orbital soft tissues unremarkable.  IMPRESSION: Old left cerebellar infarct.  No acute intracranial abnormality.   Electronically Signed   By: Charlett Nose M.D.   On: 10/19/2014 10:51   Mr Brain Wo Contrast  10/20/2014   CLINICAL DATA:  Headaches for 2 days.  Left hand and foot numbness.  EXAM: MRI HEAD WITHOUT CONTRAST  TECHNIQUE: Multiplanar, multiecho pulse sequences of the brain and surrounding structures were obtained without intravenous contrast.  COMPARISON:  Head CT 10/19/2014 and MRI 03/23/2013  FINDINGS: There is no evidence of acute infarct, mass, or extra-axial fluid collection. Predominantly subcortical encephalomalacia is present in the right frontal lobe corresponding to the acute infarct described on the prior MRI, with associated gliosis, small amount of chronic blood products, and mild ex vacuo dilatation of the anterior body and frontal horn of the right lateral ventricle. Patchy T2 hyperintensities in the subcortical and deep cerebral white matter elsewhere have slightly progressed from the prior spot MRI and are nonspecific but compatible with moderate chronic small vessel ischemic disease, advanced for age.  A chronic left PICA territory infarct is again seen. A chronic lacunar infarct is again noted in the posterior limb of the right internal capsule.  Orbits are unremarkable. Mild bilateral ethmoid air cell mucosal thickening and a left maxillary sinus mucous retention cyst are noted. No significant mastoid effusion.  Major intracranial vascular flow voids are preserved.  IMPRESSION: 1. No acute intracranial abnormality. 2. Chronic infarcts and chronic small vessel ischemia in the cerebral white matter as above, slightly progressed from prior MRI.   Electronically Signed   By: Sebastian Ache M.D.   On: 10/20/2014 17:18   US Carotid Bilateral  10/19/2014   CLINICAL DATA:  54 year old male with a history of cerebral vascular accident.  Cardiovascular risk factors include hypertension, stroke/ TIA, tobacco use, diabetes.  EXAM: BILATERAL CAROTID DUPLEX ULTRASOUND  TECHNIQUE: Wallace Cullens scale imaging, color Doppler and duplex ultrasound were  performed of bilateral carotid and vertebral arteries in the neck.  COMPARISON:  CTA neck 03/23/2013  FINDINGS: Criteria: Quantification of carotid stenosis is based on velocity parameters that correlate the residual internal carotid diameter with NASCET-based stenosis levels, using the diameter of the distal internal carotid lumen as the denominator for stenosis measurement.  The following velocity measurements were obtained:  RIGHT  ICA:  Systolic 66 cm/sec, Diastolic 25 cm/sec  CCA:  101 cm/sec  SYSTOLIC ICA/CCA RATIO:  0.7  ECA:  130 cm/sec  LEFT  ICA:  Systolic 49 cm/sec, Diastolic 13 cm/sec  CCA:  95 cm/sec  SYSTOLIC ICA/CCA RATIO:  0.5  ECA:  129 cm/sec  Right Brachial SBP: Not acquired  Left Brachial SBP: Not acquired  RIGHT CAROTID ARTERY: No significant calcified disease of the right common carotid artery. Intermediate waveform maintained. Heterogeneous plaque without significant calcifications at the right carotid bifurcation. Low resistance waveform of the right ICA. No significant tortuosity.  RIGHT VERTEBRAL ARTERY: Antegrade flow with low resistance waveform.  LEFT CAROTID ARTERY: No significant calcified disease of the left common carotid artery. Intermediate waveform maintained. Heterogeneous plaque at the left carotid bifurcation without significant calcifications. Low resistance  waveform of the left ICA.  LEFT VERTEBRAL ARTERY:  Antegrade flow with low resistance waveform.  IMPRESSION: Color duplex indicates minimal heterogeneous plaque, with no hemodynamically significant stenosis by duplex criteria in the extracranial cerebrovascular circulation.  Signed,  Yvone Neu. Loreta Ave, DO  [facility]  [facility]   Electronically Signed   By: Gilmer Mor D.O.   On: 10/19/2014 17:00   Dg Chest Portable 1 View  10/19/2014   CLINICAL DATA:  Headache for 1 week. History of hypertension, coronary artery disease and diabetes. Initial encounter.  EXAM: PORTABLE CHEST - 1 VIEW  COMPARISON:  07/21/2014.  FINDINGS: 1039 hours. The heart size and mediastinal contours are normal. The lungs are clear. There is no pleural effusion or pneumothorax. No acute osseous findings are identified. Telemetry leads overlie the chest.  IMPRESSION: Stable chest.  No active cardiopulmonary process.   Electronically Signed   By: Carey Bullocks M.D.   On: 10/19/2014 10:54     Management plans discussed with the patient, family and he is in agreement.  CODE STATUS:     Code Status Orders        Start     Ordered   10/19/14 1142  Full code   Continuous     10/19/14 1148      TOTAL TIME TAKING CARE OF THIS PATIENT: 35 minutes.    Alford Highland M.D on 10/21/2014 at 7:34 AM  Between 7am to 6pm - Pager - [phone]  After 6pm go to [email or web address] - password [email or web address] [facility]  [facility]  Office  [phone]  CC: Primary care physician; Preston Fleeting, MD

## 2014-10-21 NOTE — Discharge Instructions (Signed)

## 2014-10-21 NOTE — Progress Notes (Signed)
Patient discharged to home address via taxi with voucher. Bo Mcclintock, RN

## 2014-10-21 NOTE — Progress Notes (Signed)
Discharge instructions given and went over with patient at bedside. Prescription electronically sent to pharmacy. All questions answered. Patient attempting to find transportation at this time. Bo Mcclintock, RN

## 2014-10-21 NOTE — Plan of Care (Signed)
Problem: Discharge/Transitional Outcomes Goal: Barriers To Progression Addressed/Resolved Individualization of Care Outcome: Progressing Individualization of Care Pt prefers to be called Mr. Gentle Hx of HTN, DM, GERD, Pericarditis, polysubstance abuse, auditory hallucinations, depression, medicinal noncompliance, stroke, CAD, syncope & collapse, bipolar, obesity, aortic stenosis.    Goal: Other Discharge Outcomes/Goals Progress toward goals:  Education: Stroke education signs and symptoms as well as risk factor education this shift.  Hemodynamically: Patient afebrile and VSS this shift. BP 142/92 mmHg  Pulse 65  Temp(Src) 97.9 F (36.6 C) (Oral)  Resp 20  Ht  (1.803 m)  Wt 246 lb 1.6 oz (111.63 kg)  BMI 34.34 kg/m2  SpO2 99% Mobility/Activity: Patient up and steady on feet to bathroom this shift. Urinal at bedside.  Bed alarm on for safety as he is high fall risk from several falls at home. Pain: Patient without complaints of pain this shift. Diet: Patient on carb modified/cardiac diet. Patient CBG was high during day shift. Patient requesting additional sandwich trays and snacks this shift.  He was given one sandwich along with sugar free ice cream. BS was with sliding scale was within range this shift and no coverage was needed. Medication: Care management addressed patients needs on day shift with plan for sponsor to assist with obtaining medications after discharge.  Neuro: Neuro checks given every four hours this shift.  No change in neuro this shift.

## 2014-10-21 NOTE — Progress Notes (Signed)
Inpatient Diabetes Program Recommendations  AACE/ADA: New Consensus Statement on Inpatient Glycemic Control (2015)  Target Ranges:  Prepandial:   less than 140 mg/dL      Peak postprandial:   less than 180 mg/dL (1-2 hours)      Critically ill patients:  140 - 180 mg/dL   Results for [name] [name], [name] [name] (MRN [id number]) as of 10/21/2014 10:04  Ref. Range 10/20/2014 07:11 10/20/2014 10:38 10/20/2014 12:14 10/20/2014 15:55 10/20/2014 19:05 10/20/2014 22:17 10/21/2014 07:12  Glucose-Capillary Latest Ref Range: 65-99 mg/dL 409 (H) 811 (H) 914 (H) 216 (H) 300 (H) 160 (H) 233 (H)  Results for [name] [name], [name] [name] (MRN [id number]) as of 10/21/2014 10:04  Ref. Range 03/23/2013 16:52  Hemoglobin A1C Latest Ref Range: 4.2-6.3 % 10.1 (H)   Review of Glycemic Control  Diabetes history: DM2 Outpatient Diabetes medications: Novolog 10 units BID with meals, Metformin 500 mg BID Current orders for Inpatient glycemic control: Novolog 0-15 units TID with meals, Novolog 0-5 units, Novolog 10 units BID with meals  Inpatient Diabetes Program Recommendations: Outpatient Referral: Glucose ranged from 136-453 mg/dl on 0/86/57 and fasting glucose is 233 mg/dl this morning. Last A1C in the chart was 10.2% on 03/23/13 indicating poor glycemic control. Recommend adding an A1C to blood in lab and for patient to follow up with PCP regarding diabetes control.  Thanks, Orlando Penner, RN, MSN, CCRN, CDE Diabetes Coordinator Inpatient Diabetes Program [phone] (Team Pager from 8am to 5pm) [phone] ([facility] office) [phone] [facility] office) [phone] [facility] office)

## 2014-11-05 ENCOUNTER — Inpatient Hospital Stay
Admission: EM | Admit: 2014-11-05 | Discharge: 2014-11-06 | DRG: 918 | Disposition: A | Discharge status: Home / Self Care | Payer: Medicaid Other | Attending: Internal Medicine | Admitting: Internal Medicine

## 2014-11-05 ENCOUNTER — Inpatient Hospital Stay (HOSPITAL_COMMUNITY)
Admit: 2014-11-05 | Discharge: 2014-11-05 | Disposition: A | Discharge status: Home / Self Care | Payer: Medicaid Other | Attending: Internal Medicine | Admitting: Internal Medicine

## 2014-11-05 ENCOUNTER — Emergency Department: Payer: Medicaid Other

## 2014-11-05 DIAGNOSIS — I25110 Atherosclerotic heart disease of native coronary artery with unstable angina pectoris: Secondary | ICD-10-CM | POA: Diagnosis present

## 2014-11-05 DIAGNOSIS — I159 Secondary hypertension, unspecified: Secondary | ICD-10-CM | POA: Diagnosis present

## 2014-11-05 DIAGNOSIS — F17210 Nicotine dependence, cigarettes, uncomplicated: Secondary | ICD-10-CM | POA: Diagnosis present

## 2014-11-05 DIAGNOSIS — F319 Bipolar disorder, unspecified: Secondary | ICD-10-CM | POA: Diagnosis present

## 2014-11-05 DIAGNOSIS — I422 Other hypertrophic cardiomyopathy: Secondary | ICD-10-CM | POA: Diagnosis present

## 2014-11-05 DIAGNOSIS — F1420 Cocaine dependence, uncomplicated: Secondary | ICD-10-CM | POA: Diagnosis present

## 2014-11-05 DIAGNOSIS — I10 Essential (primary) hypertension: Secondary | ICD-10-CM

## 2014-11-05 DIAGNOSIS — I340 Nonrheumatic mitral (valve) insufficiency: Secondary | ICD-10-CM

## 2014-11-05 DIAGNOSIS — Z8673 Personal history of transient ischemic attack (TIA), and cerebral infarction without residual deficits: Secondary | ICD-10-CM | POA: Diagnosis not present

## 2014-11-05 DIAGNOSIS — E1159 Type 2 diabetes mellitus with other circulatory complications: Secondary | ICD-10-CM

## 2014-11-05 DIAGNOSIS — I209 Angina pectoris, unspecified: Secondary | ICD-10-CM | POA: Diagnosis not present

## 2014-11-05 DIAGNOSIS — F1410 Cocaine abuse, uncomplicated: Secondary | ICD-10-CM | POA: Diagnosis not present

## 2014-11-05 DIAGNOSIS — F419 Anxiety disorder, unspecified: Secondary | ICD-10-CM | POA: Diagnosis present

## 2014-11-05 DIAGNOSIS — R45851 Suicidal ideations: Secondary | ICD-10-CM | POA: Diagnosis present

## 2014-11-05 DIAGNOSIS — R0789 Other chest pain: Secondary | ICD-10-CM | POA: Diagnosis present

## 2014-11-05 DIAGNOSIS — F1020 Alcohol dependence, uncomplicated: Secondary | ICD-10-CM | POA: Diagnosis present

## 2014-11-05 DIAGNOSIS — E1165 Type 2 diabetes mellitus with hyperglycemia: Secondary | ICD-10-CM | POA: Diagnosis present

## 2014-11-05 DIAGNOSIS — Z915 Personal history of self-harm: Secondary | ICD-10-CM

## 2014-11-05 DIAGNOSIS — I119 Hypertensive heart disease without heart failure: Secondary | ICD-10-CM | POA: Diagnosis present

## 2014-11-05 DIAGNOSIS — I350 Nonrheumatic aortic (valve) stenosis: Secondary | ICD-10-CM | POA: Diagnosis present

## 2014-11-05 DIAGNOSIS — Z7902 Long term (current) use of antithrombotics/antiplatelets: Secondary | ICD-10-CM

## 2014-11-05 DIAGNOSIS — E785 Hyperlipidemia, unspecified: Secondary | ICD-10-CM | POA: Diagnosis present

## 2014-11-05 DIAGNOSIS — K219 Gastro-esophageal reflux disease without esophagitis: Secondary | ICD-10-CM | POA: Diagnosis present

## 2014-11-05 DIAGNOSIS — Z79899 Other long term (current) drug therapy: Secondary | ICD-10-CM | POA: Diagnosis not present

## 2014-11-05 DIAGNOSIS — I25111 Atherosclerotic heart disease of native coronary artery with angina pectoris with documented spasm: Secondary | ICD-10-CM | POA: Diagnosis not present

## 2014-11-05 DIAGNOSIS — Z7982 Long term (current) use of aspirin: Secondary | ICD-10-CM

## 2014-11-05 DIAGNOSIS — I2510 Atherosclerotic heart disease of native coronary artery without angina pectoris: Secondary | ICD-10-CM

## 2014-11-05 DIAGNOSIS — Z6835 Body mass index (BMI) 35.0-35.9, adult: Secondary | ICD-10-CM

## 2014-11-05 DIAGNOSIS — T405X1A Poisoning by cocaine, accidental (unintentional), initial encounter: Secondary | ICD-10-CM | POA: Diagnosis present

## 2014-11-05 DIAGNOSIS — Y929 Unspecified place or not applicable: Secondary | ICD-10-CM | POA: Diagnosis not present

## 2014-11-05 DIAGNOSIS — E669 Obesity, unspecified: Secondary | ICD-10-CM | POA: Diagnosis present

## 2014-11-05 DIAGNOSIS — I214 Non-ST elevation (NSTEMI) myocardial infarction: Secondary | ICD-10-CM

## 2014-11-05 LAB — CBC
HCT: 49.6 % (ref 40.0–52.0)
HEMATOCRIT: 45.5 % (ref 40.0–52.0)
HEMOGLOBIN: 15.7 g/dL (ref 13.0–18.0)
Hemoglobin: 17.3 g/dL (ref 13.0–18.0)
MCH: 31.9 pg (ref 26.0–34.0)
MCH: 31.5 pg (ref 26.0–34.0)
MCHC: 34.8 g/dL (ref 32.0–36.0)
MCHC: 34.6 g/dL (ref 32.0–36.0)
MCV: 91.6 fL (ref 80.0–100.0)
MCV: 91 fL (ref 80.0–100.0)
PLATELETS: 230 10*3/uL (ref 150–440)
Platelets: 203 10*3/uL (ref 150–440)
RBC: 5.41 MIL/uL (ref 4.40–5.90)
RBC: 5 MIL/uL (ref 4.40–5.90)
RDW: 13 % (ref 11.5–14.5)
RDW: 12.6 % (ref 11.5–14.5)
WBC: 17.8 10*3/uL — AB (ref 3.8–10.6)
WBC: 14.3 10*3/uL — ABNORMAL HIGH (ref 3.8–10.6)

## 2014-11-05 LAB — MAGNESIUM: MAGNESIUM: 2 mg/dL (ref 1.7–2.4)

## 2014-11-05 LAB — COMPREHENSIVE METABOLIC PANEL
ALT: 13 U/L — ABNORMAL LOW (ref 17–63)
AST: 23 U/L (ref 15–41)
Albumin: 4.5 g/dL (ref 3.5–5.0)
Alkaline Phosphatase: 63 U/L (ref 38–126)
Anion gap: 10 (ref 5–15)
BUN: 20 mg/dL (ref 6–20)
CHLORIDE: 101 mmol/L (ref 101–111)
CO2: 26 mmol/L (ref 22–32)
Calcium: 9.2 mg/dL (ref 8.9–10.3)
Creatinine, Ser: 1.35 mg/dL — ABNORMAL HIGH (ref 0.61–1.24)
GFR, EST NON AFRICAN AMERICAN: 58 mL/min — AB (ref >60)
Glucose, Bld: 117 mg/dL — ABNORMAL HIGH (ref 65–99)
POTASSIUM: 3.4 mmol/L — AB (ref 3.5–5.1)
SODIUM: 137 mmol/L (ref 135–145)
Total Bilirubin: 1.1 mg/dL (ref 0.3–1.2)
Total Protein: 7.9 g/dL (ref 6.5–8.1)

## 2014-11-05 LAB — TROPONIN I
TROPONIN I: 0.07 ng/mL — AB (ref <0.031)
TROPONIN I: 0.08 ng/mL — AB (ref <0.031)

## 2014-11-05 LAB — HEPARIN LEVEL (UNFRACTIONATED): HEPARIN UNFRACTIONATED: 0.23 [IU]/mL — AB (ref 0.30–0.70)

## 2014-11-05 LAB — BASIC METABOLIC PANEL
Anion gap: 7 (ref 5–15)
BUN: 18 mg/dL (ref 6–20)
CHLORIDE: 103 mmol/L (ref 101–111)
CO2: 26 mmol/L (ref 22–32)
CREATININE: 1.21 mg/dL (ref 0.61–1.24)
Calcium: 8.8 mg/dL — ABNORMAL LOW (ref 8.9–10.3)
GFR calc non Af Amer: >60 mL/min (ref >60)
Glucose, Bld: 152 mg/dL — ABNORMAL HIGH (ref 65–99)
Potassium: 3.6 mmol/L (ref 3.5–5.1)
Sodium: 136 mmol/L (ref 135–145)

## 2014-11-05 LAB — URINE DRUG SCREEN, QUALITATIVE (ARMC ONLY)
Amphetamines, Ur Screen: NOT DETECTED
BARBITURATES, UR SCREEN: NOT DETECTED
Benzodiazepine, Ur Scrn: NOT DETECTED
CANNABINOID 50 NG, UR ~~LOC~~: NOT DETECTED
Cocaine Metabolite,Ur ~~LOC~~: POSITIVE — AB
MDMA (Ecstasy)Ur Screen: NOT DETECTED
METHADONE SCREEN, URINE: NOT DETECTED
Opiate, Ur Screen: POSITIVE — AB
Phencyclidine (PCP) Ur S: NOT DETECTED
TRICYCLIC, UR SCREEN: NOT DETECTED

## 2014-11-05 LAB — APTT: APTT: 31 s (ref 24–36)

## 2014-11-05 LAB — GLUCOSE, CAPILLARY
Glucose-Capillary: 76 mg/dL (ref 65–99)
Glucose-Capillary: 154 mg/dL — ABNORMAL HIGH (ref 65–99)
Glucose-Capillary: 149 mg/dL — ABNORMAL HIGH (ref 65–99)

## 2014-11-05 LAB — PROTIME-INR
INR: 1.03
PROTHROMBIN TIME: 13.7 s (ref 11.4–15.0)

## 2014-11-05 MED ORDER — LISINOPRIL 20 MG PO TABS
40.0000 mg | ORAL_TABLET | Freq: Every day | ORAL | Status: DC
  Administered 2014-11-05 – 2014-11-06 (×2): 40 mg via ORAL
  Filled 2014-11-05 (×2): qty 2

## 2014-11-05 MED ORDER — CLONIDINE HCL 0.1 MG PO TABS
0.2000 mg | ORAL_TABLET | Freq: Two times a day (BID) | ORAL | Status: DC
  Administered 2014-11-05 – 2014-11-06 (×3): 0.2 mg via ORAL
  Filled 2014-11-05 (×3): qty 2

## 2014-11-05 MED ORDER — ASPIRIN EC 81 MG PO TBEC
81.0000 mg | DELAYED_RELEASE_TABLET | Freq: Every day | ORAL | Status: DC
  Administered 2014-11-06: 81 mg via ORAL
  Filled 2014-11-05: qty 1

## 2014-11-05 MED ORDER — SODIUM CHLORIDE 0.9 % IV SOLN: 250.0000 mL | INTRAVENOUS | Status: DC | PRN

## 2014-11-05 MED ORDER — HEPARIN BOLUS VIA INFUSION
1500.0000 [IU] | Freq: Once | INTRAVENOUS | Status: AC
  Administered 2014-11-05: 1500 [IU] via INTRAVENOUS
  Filled 2014-11-05: qty 1500

## 2014-11-05 MED ORDER — MORPHINE SULFATE (PF) 2 MG/ML IV SOLN: 2.0000 mg | INTRAVENOUS | Status: DC | PRN

## 2014-11-05 MED ORDER — ONDANSETRON HCL 4 MG/2ML IJ SOLN
4.0000 mg | Freq: Once | INTRAMUSCULAR | Status: AC
  Administered 2014-11-05: 4 mg via INTRAVENOUS

## 2014-11-05 MED ORDER — SODIUM CHLORIDE 0.9 % IJ SOLN
3.0000 mL | Freq: Two times a day (BID) | INTRAMUSCULAR | Status: DC
  Administered 2014-11-05: 3 mL via INTRAVENOUS

## 2014-11-05 MED ORDER — SODIUM CHLORIDE 0.9 % IJ SOLN
3.0000 mL | Freq: Two times a day (BID) | INTRAMUSCULAR | Status: DC
  Administered 2014-11-05 – 2014-11-06 (×2): 3 mL via INTRAVENOUS

## 2014-11-05 MED ORDER — INSULIN ASPART 100 UNIT/ML ~~LOC~~ SOLN
0.0000 [IU] | Freq: Three times a day (TID) | SUBCUTANEOUS | Status: DC
  Administered 2014-11-05: 3 [IU] via SUBCUTANEOUS
  Administered 2014-11-06 (×2): 5 [IU] via SUBCUTANEOUS
  Administered 2014-11-06: 8 [IU] via SUBCUTANEOUS
  Filled 2014-11-05: qty 8
  Filled 2014-11-05 (×2): qty 5
  Filled 2014-11-05: qty 3

## 2014-11-05 MED ORDER — MORPHINE SULFATE (PF) 4 MG/ML IV SOLN
4.0000 mg | Freq: Once | INTRAVENOUS | Status: AC
  Administered 2014-11-05: 4 mg via INTRAVENOUS

## 2014-11-05 MED ORDER — ASPIRIN EC 81 MG PO TBEC: 81.0000 mg | DELAYED_RELEASE_TABLET | Freq: Every day | ORAL | Status: DC

## 2014-11-05 MED ORDER — ATORVASTATIN CALCIUM 20 MG PO TABS
20.0000 mg | ORAL_TABLET | Freq: Every day | ORAL | Status: DC
  Administered 2014-11-05 – 2014-11-06 (×2): 20 mg via ORAL
  Filled 2014-11-05 (×2): qty 1

## 2014-11-05 MED ORDER — HEPARIN (PORCINE) IN NACL 100-0.45 UNIT/ML-% IJ SOLN
1500.0000 [IU]/h | INTRAMUSCULAR | Status: DC
  Administered 2014-11-05: 1300 [IU]/h via INTRAVENOUS
  Filled 2014-11-05 (×2): qty 250

## 2014-11-05 MED ORDER — HYDRALAZINE HCL 20 MG/ML IJ SOLN
10.0000 mg | Freq: Four times a day (QID) | INTRAMUSCULAR | Status: DC | PRN
  Administered 2014-11-05: 10 mg via INTRAVENOUS
  Filled 2014-11-05: qty 1

## 2014-11-05 MED ORDER — ACETAMINOPHEN 650 MG RE SUPP: 650.0000 mg | Freq: Four times a day (QID) | RECTAL | Status: DC | PRN

## 2014-11-05 MED ORDER — PANTOPRAZOLE SODIUM 40 MG PO TBEC
40.0000 mg | DELAYED_RELEASE_TABLET | Freq: Every day | ORAL | Status: DC
  Administered 2014-11-05 – 2014-11-06 (×2): 40 mg via ORAL
  Filled 2014-11-05 (×2): qty 1

## 2014-11-05 MED ORDER — BUPROPION HCL ER (XL) 150 MG PO TB24
150.0000 mg | ORAL_TABLET | Freq: Every day | ORAL | Status: DC
  Administered 2014-11-05 – 2014-11-06 (×2): 150 mg via ORAL
  Filled 2014-11-05 (×2): qty 1

## 2014-11-05 MED ORDER — POTASSIUM CHLORIDE CRYS ER 20 MEQ PO TBCR
40.0000 meq | EXTENDED_RELEASE_TABLET | Freq: Once | ORAL | Status: AC
  Administered 2014-11-05: 40 meq via ORAL
  Filled 2014-11-05: qty 2

## 2014-11-05 MED ORDER — AMLODIPINE BESYLATE 10 MG PO TABS
10.0000 mg | ORAL_TABLET | Freq: Every day | ORAL | Status: DC
  Administered 2014-11-05 – 2014-11-06 (×2): 10 mg via ORAL
  Filled 2014-11-05: qty 2
  Filled 2014-11-05: qty 1

## 2014-11-05 MED ORDER — PANTOPRAZOLE SODIUM 40 MG IV SOLR
40.0000 mg | Freq: Once | INTRAVENOUS | Status: AC
  Administered 2014-11-05: 40 mg via INTRAVENOUS
  Filled 2014-11-05: qty 40

## 2014-11-05 MED ORDER — ACETAMINOPHEN 325 MG PO TABS
650.0000 mg | ORAL_TABLET | Freq: Four times a day (QID) | ORAL | Status: DC | PRN
  Administered 2014-11-06 (×2): 650 mg via ORAL
  Filled 2014-11-05 (×2): qty 2

## 2014-11-05 MED ORDER — SODIUM CHLORIDE 0.9 % IJ SOLN: 3.0000 mL | INTRAMUSCULAR | Status: DC | PRN

## 2014-11-05 MED ORDER — ONDANSETRON HCL 4 MG/2ML IJ SOLN
INTRAMUSCULAR | Status: AC
Start: 2014-11-05 — End: 2014-11-05
  Administered 2014-11-05: 4 mg via INTRAVENOUS
  Filled 2014-11-05: qty 2

## 2014-11-05 MED ORDER — ALUM & MAG HYDROXIDE-SIMETH 200-200-20 MG/5ML PO SUSP
30.0000 mL | Freq: Four times a day (QID) | ORAL | Status: DC | PRN
  Administered 2014-11-05: 30 mL via ORAL
  Filled 2014-11-05: qty 30

## 2014-11-05 MED ORDER — CLOPIDOGREL BISULFATE 75 MG PO TABS
75.0000 mg | ORAL_TABLET | Freq: Every day | ORAL | Status: DC
  Administered 2014-11-05 – 2014-11-06 (×2): 75 mg via ORAL
  Filled 2014-11-05 (×2): qty 1

## 2014-11-05 MED ORDER — MORPHINE SULFATE (PF) 4 MG/ML IV SOLN
INTRAVENOUS | Status: AC
  Administered 2014-11-05: 4 mg via INTRAVENOUS
  Filled 2014-11-05: qty 1

## 2014-11-05 MED ORDER — ONDANSETRON HCL 4 MG/2ML IJ SOLN: 4.0000 mg | Freq: Four times a day (QID) | INTRAMUSCULAR | Status: DC | PRN

## 2014-11-05 MED ORDER — BUSPIRONE HCL 10 MG PO TABS
10.0000 mg | ORAL_TABLET | Freq: Two times a day (BID) | ORAL | Status: DC
  Administered 2014-11-05 – 2014-11-06 (×3): 10 mg via ORAL
  Filled 2014-11-05 (×3): qty 1

## 2014-11-05 MED ORDER — ONDANSETRON HCL 4 MG PO TABS: 4.0000 mg | ORAL_TABLET | Freq: Four times a day (QID) | ORAL | Status: DC | PRN

## 2014-11-05 MED ORDER — TRAZODONE HCL 100 MG PO TABS
100.0000 mg | ORAL_TABLET | Freq: Every day | ORAL | Status: DC
  Administered 2014-11-05: 100 mg via ORAL
  Filled 2014-11-05: qty 1

## 2014-11-05 MED ORDER — SODIUM CHLORIDE 0.9 % IV BOLUS (SEPSIS)
500.0000 mL | INTRAVENOUS | Status: AC
  Administered 2014-11-05: 500 mL via INTRAVENOUS

## 2014-11-05 MED ORDER — LORAZEPAM 2 MG/ML IJ SOLN: 1.0000 mg | Freq: Once | INTRAMUSCULAR | Status: DC

## 2014-11-05 MED ORDER — CHLORTHALIDONE 25 MG PO TABS
25.0000 mg | ORAL_TABLET | Freq: Every day | ORAL | Status: DC
  Administered 2014-11-05 – 2014-11-06 (×2): 25 mg via ORAL
  Filled 2014-11-05 (×2): qty 1

## 2014-11-05 MED ORDER — ASPIRIN 81 MG PO CHEW
324.0000 mg | CHEWABLE_TABLET | Freq: Once | ORAL | Status: AC
  Administered 2014-11-05: 324 mg via ORAL
  Filled 2014-11-05: qty 4

## 2014-11-05 NOTE — H&P (Signed)
[facility] [place] [facility]   PATIENT NAME: [name] [name]    MR#:  [id number]  DATE OF BIRTH:  1960-05-20  DATE OF ADMISSION:  11/05/2014  PRIMARY CARE PHYSICIAN: Preston Fleeting, MD   REQUESTING/REFERRING PHYSICIAN: York Cerise  CHIEF COMPLAINT:   Chief Complaint  Patient presents with  . Chest Pain    HISTORY OF PRESENT ILLNESS:  [name] [name]  is a 54 y.o. male with a known history of hypertension, diabetes mellitus type 2, CVA, TIA- 9 /2016, history of polysubstance abuse, coronary artery disease, bipolar disorder, GERD presents to the emergency room with the complaints of acute onset of chest pain. Patient states he used 1 tablet of Viagra and also smoked cocaine around 5 PM yesterday and after that he developed retrosternal chest pain at night. Associated shortness of breath and diaphoresis +. On arrival to the ED patient was having ongoing chest pain and was also noted to have elevated blood pressure of 180/130's. Workup revealed elevated troponin of 0.07, wbc is 17.8, potassium 3.1, creatinine 1.35. Chest x-ray negative for acute cardio pulmonary pathology. EKG sinus tachycardia with ventricular rate of 90 bpm, LAD, T-wave abnormality. Patient was given aspirin, morphine and Ativan and was also started on heparin drip. Hospitalist service was consulted for further management. At the current time patient is resting in the bed and states his chest pain is getting under control. Patient denies any history of recent fever, cough, nausea, vomiting, diarrhea, abdominal pain, dysuria. Denies any focal weakness or numbness.      PAST MEDICAL HISTORY:   Past Medical History  Diagnosis Date  . Hypertension   . Diabetes mellitus   . GERD (gastroesophageal reflux disease)   . Pericarditis   . Polysubstance abuse     cocaine, marijuana  . Auditory hallucinations   . Depression   . Hx of medication noncompliance   . History of suicide attempt   .  Stroke [facility]) February 2015    Right MCA aneurysm; head MRI - acute infarct of the right frontal lobe. Smaller acute infarct of the left frontal lobe. Thought to be due to cerebral emboli. Left PICA infarct. Small chronic lacunar infarct right internal capsule. Chronic microvascular ischemic change with white matter change.  . Coronary artery disease     No reported catheterization. Negative Myoview stress test and 2013.  Marland Kitchen Syncope and collapse   . Bipolar disorder (HCC)   . Obesity, Class II, BMI 35-39.9, with comorbidity (HCC)   . Hypertensive hypertrophic cardiomyopathy [facility]) February 2015    Severe concentric LVH on echocardiogram.  . Aortic stenosis, mild February 2015    PAST SURGICAL HISTORY:   Past Surgical History  Procedure Laterality Date  . Pericardial fluid drainage    . Transthoracic echocardiogram  03/23/2013    EF 5560%. Elevated left atrial pressure. Severe concentric LVH. Mild aortic stenosis. -- Appearance of hypertensive hypertrophic heart disease.  No clear cardioembolic source  . Nm myoview ltd  July 2013    No ischemia or infarct. There is inferior wall defec suggestive of diaphragmatic attenuation. Mild LV dilation at to be due to hypertensive heart disease. EF 55%. Poor exercise tolerance.  . Carotid dopplers:  03/23/2013    Interval thickening of bilateral common carotid no significant plaque formation. Tortuosity but no significant stenosis. Patent bilateral vertebral  arteries    SOCIAL HISTORY:   Social History  Substance Use Topics  . Smoking status: Current Every Day Smoker -- 0.50 packs/day for  15 years    Types: Cigars  . Smokeless tobacco: Not on file  . Alcohol Use: 25.8 oz/week    3 Glasses of wine, 40 Cans of beer per week     Comment: occasionally; reports 1 pint of vodka per month at paydaysocially    FAMILY HISTORY:   Family History  Problem Relation Age of Onset  . Stroke Mother   . Diabetes type II Mother   . Hypertension Mother   .  Hypertension Father   . Arthritis Father   . Arthritis Sister   . Diabetes type II Sister   . Hypertension Sister     DRUG ALLERGIES:  No Known Allergies  REVIEW OF SYSTEMS:   Review of Systems  Constitutional: Positive for diaphoresis. Negative for fever, chills and malaise/fatigue.  HENT: Negative for ear pain, hearing loss, nosebleeds, sore throat and tinnitus.   Eyes: Negative for blurred vision, double vision, pain, discharge and redness.  Respiratory: Negative for cough, hemoptysis, sputum production, shortness of breath and wheezing.   Cardiovascular: Positive for chest pain. Negative for palpitations, orthopnea and leg swelling.  Gastrointestinal: Negative for nausea, vomiting, abdominal pain, diarrhea, constipation, blood in stool and melena.  Genitourinary: Negative for dysuria, urgency, frequency and hematuria.  Musculoskeletal: Negative for back pain, joint pain and neck pain.  Skin: Negative for itching and rash.  Neurological: Negative for dizziness, tingling, sensory change, focal weakness and seizures.  Endo/Heme/Allergies: Does not bruise/bleed easily.  Psychiatric/Behavioral: Negative for depression. The patient is not nervous/anxious.     MEDICATIONS AT HOME:   Prior to Admission medications   Medication Sig Start Date End Date Taking? Authorizing Provider  amLODipine (NORVASC) 10 MG tablet Take 10 mg by mouth daily.   Yes Historical Provider, MD  aspirin EC 81 MG EC tablet Take 1 tablet (81 mg total) by mouth daily. 07/21/14  Yes Adrian Saran, MD  atorvastatin (LIPITOR) 20 MG tablet Take 1 tablet (20 mg total) by mouth daily at 6 PM. 10/21/14  Yes Alford Highland, MD  buPROPion (WELLBUTRIN XL) 150 MG 24 hr tablet Take 150 mg by mouth daily.   Yes Historical Provider, MD  busPIRone (BUSPAR) 10 MG tablet Take 10 mg by mouth 2 (two) times daily.   Yes Historical Provider, MD  chlorthalidone (HYGROTON) 25 MG tablet Take 1 tablet (25 mg total) by mouth daily. 07/25/14   Yes Phineas Semen, MD  cloNIDine (CATAPRES) 0.2 MG tablet Take 0.2 mg by mouth 2 (two) times daily.    Yes Sanjuana Kava, NP  clopidogrel (PLAVIX) 75 MG tablet Take 1 tablet (75 mg total) by mouth daily. 07/21/14  Yes Adrian Saran, MD  diclofenac sodium (VOLTAREN) 1 % GEL Apply 2 g topically 4 (four) times daily.   Yes Historical Provider, MD  insulin aspart (NOVOLOG) 100 UNIT/ML injection Inject 10 Units into the skin 2 (two) times daily with a meal.   Yes Historical Provider, MD  lisinopril (PRINIVIL,ZESTRIL) 40 MG tablet Take 40 mg by mouth daily.   Yes Historical Provider, MD  metFORMIN (GLUCOPHAGE) 500 MG tablet Take 500 mg by mouth 2 (two) times daily.   Yes Historical Provider, MD  pantoprazole (PROTONIX) 40 MG tablet Take 40 mg by mouth daily.   Yes Historical Provider, MD  traZODone (DESYREL) 100 MG tablet Take 1 tablet (100 mg total) by mouth at bedtime. 10/21/14  Yes Alford Highland, MD      VITAL SIGNS:  Blood pressure 193/124, pulse 90, temperature 98.3 F (36.8 C),  temperature source Oral, resp. rate 23, height  (1.803 m), weight 113.399 kg (250 lb), SpO2 98 %.  PHYSICAL EXAMINATION:  Physical Exam  Constitutional: He is oriented to person, place, and time. He appears well-developed and well-nourished. No distress.  HENT:  Head: Normocephalic and atraumatic.  Right Ear: External ear normal.  Left Ear: External ear normal.  Nose: Nose normal.  Mouth/Throat: Oropharynx is clear and moist. No oropharyngeal exudate.  Eyes: EOM are normal. Pupils are equal, round, and reactive to light. No scleral icterus.  Neck: Normal range of motion. Neck supple. No JVD present. No thyromegaly present.  Cardiovascular: Normal rate, regular rhythm, normal heart sounds and intact distal pulses.  Exam reveals no friction rub.   No murmur heard. Respiratory: Effort normal and breath sounds normal. No respiratory distress. He has no wheezes. He has no rales. He exhibits no tenderness.  GI:  Soft. Bowel sounds are normal. He exhibits no distension and no mass. There is no tenderness. There is no rebound and no guarding.  Musculoskeletal: Normal range of motion. He exhibits no edema.  Lymphadenopathy:    He has no cervical adenopathy.  Neurological: He is alert and oriented to person, place, and time. He has normal reflexes. He displays normal reflexes. No cranial nerve deficit. He exhibits normal muscle tone.  Skin: Skin is warm. No rash noted. No erythema.  Psychiatric: He has a normal mood and affect. His behavior is normal. Thought content normal.   LABORATORY PANEL:   CBC  Recent Labs Lab 11/05/14 0235  WBC 17.8*  HGB 17.3  HCT 49.6  PLT 230   ------------------------------------------------------------------------------------------------------------------  Chemistries   Recent Labs Lab 11/05/14 0235  NA 137  K 3.4*  CL 101  CO2 26  GLUCOSE 117*  BUN 20  CREATININE 1.35*  CALCIUM 9.2  MG 2.0  AST 23  ALT 13*  ALKPHOS 63  BILITOT 1.1   ------------------------------------------------------------------------------------------------------------------  Cardiac Enzymes  Recent Labs Lab 11/05/14 0235  TROPONINI 0.07*   ------------------------------------------------------------------------------------------------------------------  RADIOLOGY:  Dg Chest Portable 1 View  11/05/2014   CLINICAL DATA:  Acute onset of midsternal sharp chest pain, radiating to the back. Initial encounter.  EXAM: PORTABLE CHEST 1 VIEW  COMPARISON:  Chest radiograph performed 10/19/2014  FINDINGS: The lungs are well-aerated and clear. There is no evidence of focal opacification, pleural effusion or pneumothorax.  The cardiomediastinal silhouette is borderline normal in size. No acute osseous abnormalities are seen.  IMPRESSION: No acute cardiopulmonary process seen.   Electronically Signed   By: Roanna Raider M.D.   On: 11/05/2014 03:41    EKG:   Orders placed or  performed during the hospital encounter of 11/05/14  . EKG 12-Lead  . EKG 12-Lead  . ED EKG  . ED EKG  Sinus seeker area with ventricular rate of 98 100, LAD, T-wave abnormality  IMPRESSION AND PLAN:   1. Chest pain with elevated troponin of 0.07- non-STEMI. Patient used  Viagra and smoked cocaine around 5 PM yesterday. History of coronary artery disease +. Plan: Admit to ICU, continue aspirin, statin and heparin drip. Nitrates and beta blockers contraindicated because of Viagra use and cocaine use. Cycle cardiac enzymes. IV morphine for pain control. Request echocardiogram and cardiology consultation for further evaluation and management. 2. Malignant hypertension, history of hypertension, patient on multiple antihypertensive medications, admits noncompliance to medication use. Plan: Clonidine when necessary for elevated blood pressure. Restart home blood pressure medications. Monitor blood pressure closely. 3. Cocaine use, history of  polysubstance use. Plan: U tox requested and pending at this time. Counseling done. 4. Leukocytosis likely stress related. No history of fever or cough or illness. Doubt sepsis/infection. Plan: Blood cultures, monitor temperature curve, follow-up CBC. 5. Diabetes mellitus type 2. Continue SSI, monitor blood sugars closed. 6. History of CVA, recent TIA in 9/ 2016. No acute symptoms at present. 7. Bipolar disorder/depression, stable on home medications. Continue same. 8. GERD, stable on PPI. Continue same.  DVT prophylaxis-heparin drip  All the records are reviewed and case discussed with ED provider. Management plans discussed with the patient,  and  in agreement.  CODE STATUS: Full code  TOTAL TIME TAKING CARE OF THIS PATIENT: 50 minutes.    Jonnie Kind N M.D on 11/05/2014 at 4:52 AM  Between 7am to 6pm - Pager - [phone]  After 6pm go to [email or web address] - password [email or web address] [facility]  [facility]  Office  [phone]  CC: Primary  care physician; Preston Fleeting, MD

## 2014-11-05 NOTE — ED Notes (Signed)
Dr York Cerise notified of Trop=0.07

## 2014-11-05 NOTE — Progress Notes (Signed)
Room air. NSR. Pt reports no chest pain. FS are stable. Ambulating in the room. Urinal. ECHO done. Takes meds ok. Pt has no further concerns at this time.

## 2014-11-05 NOTE — ED Provider Notes (Signed)
[facility] Emergency Department Provider Note  ____________________________________________  Time seen: Approximately 3:17 AM  I have reviewed the triage vital signs and the nursing notes.   HISTORY  Chief Complaint Chest Pain    HPI [name] [name]. is a 54 y.o. male with a history of drug abuse, CVA approximately one year ago, hypertension, diabetes, acid reflux, and bipolar disorder who presents with acute onset of chest pain approximately 2 hours prior to arrival.  He admits that he used factor for the first time and also smoked crack cocaine.  The chest pain is substernal, sharp, severe, and radiates through to his back.  He is also having a lot of burping and feeling of abdominal distention.He is presently well-appearing based on his description of presentation but is tachycardic and sitting up with his discomfort, burping frequently.  He denies any shortness of breath, abdominal pain, headache, or dizziness or neurological deficits.   Past Medical History  Diagnosis Date  . Hypertension   . Diabetes mellitus   . GERD (gastroesophageal reflux disease)   . Pericarditis   . Polysubstance abuse     cocaine, marijuana  . Auditory hallucinations   . Depression   . Hx of medication noncompliance   . History of suicide attempt   . Stroke [facility]) February 2015    Right MCA aneurysm; head MRI - acute infarct of the right frontal lobe. Smaller acute infarct of the left frontal lobe. Thought to be due to cerebral emboli. Left PICA infarct. Small chronic lacunar infarct right internal capsule. Chronic microvascular ischemic change with white matter change.  . Coronary artery disease     No reported catheterization. Negative Myoview stress test and 2013.  Marland Kitchen Syncope and collapse   . Bipolar disorder (HCC)   . Obesity, Class II, BMI 35-39.9, with comorbidity (HCC)   . Hypertensive hypertrophic cardiomyopathy [facility]) February 2015    Severe concentric LVH on  echocardiogram.  . Aortic stenosis, mild February 2015    Patient Active Problem List   Diagnosis Date Noted  . CVA (cerebral infarction) 10/19/2014  . CVA (cerebral vascular accident) 05/20/2013  . LVH (left ventricular hypertrophy) 05/20/2013  . Obesity (BMI 30-39.9) 04/10/2013  . Obesity, Class II, BMI 35-39.9, with comorbidity   . Hypertensive hypertrophic cardiomyopathy 03/07/2013  . Aortic stenosis, mild 03/07/2013  . Hypertension 01/06/2013  . Hypertensive urgency 01/05/2013  . DM (diabetes mellitus), type 2, uncontrolled 01/05/2013  . Anxiety 01/12/2012  . Depression with suicidal ideation 01/09/2012    Class: Acute  . Cocaine abuse with cocaine-induced anxiety disorder 01/09/2012    Class: Acute  . Hypertensive encephalopathy 11/07/2010  . Polysubstance abuse 11/07/2010  . DM 01/25/2009  . PSYCHIATRIC DISORDER 01/25/2009  . Essential hypertension, malignant 01/25/2009  . PERICARDIAL TAMPONADE 01/25/2009  . INSOMNIA 01/25/2009  . Shortness of breath 01/25/2009  . EPIGASTRIC PAIN 01/25/2009    Past Surgical History  Procedure Laterality Date  . Pericardial fluid drainage    . Transthoracic echocardiogram  03/23/2013    EF 5560%. Elevated left atrial pressure. Severe concentric LVH. Mild aortic stenosis. -- Appearance of hypertensive hypertrophic heart disease.  No clear cardioembolic source  . Nm myoview ltd  July 2013    No ischemia or infarct. There is inferior wall defec suggestive of diaphragmatic attenuation. Mild LV dilation at to be due to hypertensive heart disease. EF 55%. Poor exercise tolerance.  . Carotid dopplers:  03/23/2013    Interval thickening of bilateral common carotid no significant  plaque formation. Tortuosity but no significant stenosis. Patent bilateral vertebral  arteries    Current Outpatient Rx  Name  Route  Sig  Dispense  Refill  . amLODipine (NORVASC) 10 MG tablet   Oral   Take 10 mg by mouth daily.         Marland Kitchen aspirin EC 81 MG EC  tablet   Oral   Take 1 tablet (81 mg total) by mouth daily.   120 tablet   0   . atorvastatin (LIPITOR) 20 MG tablet   Oral   Take 1 tablet (20 mg total) by mouth daily at 6 PM.   30 tablet   0   . buPROPion (WELLBUTRIN XL) 150 MG 24 hr tablet   Oral   Take 150 mg by mouth daily.         . busPIRone (BUSPAR) 10 MG tablet   Oral   Take 10 mg by mouth 2 (two) times daily.         . chlorthalidone (HYGROTON) 25 MG tablet   Oral   Take 1 tablet (25 mg total) by mouth daily.   30 tablet   0   . cloNIDine (CATAPRES) 0.2 MG tablet   Oral   Take 0.2 mg by mouth 2 (two) times daily.          . clopidogrel (PLAVIX) 75 MG tablet   Oral   Take 1 tablet (75 mg total) by mouth daily.   30 tablet   0   . diclofenac sodium (VOLTAREN) 1 % GEL   Topical   Apply 2 g topically 4 (four) times daily.         . insulin aspart (NOVOLOG) 100 UNIT/ML injection   Subcutaneous   Inject 10 Units into the skin 2 (two) times daily with a meal.         . lisinopril (PRINIVIL,ZESTRIL) 40 MG tablet   Oral   Take 40 mg by mouth daily.         . metFORMIN (GLUCOPHAGE) 500 MG tablet   Oral   Take 500 mg by mouth 2 (two) times daily.         . pantoprazole (PROTONIX) 40 MG tablet   Oral   Take 40 mg by mouth daily.         . traZODone (DESYREL) 100 MG tablet   Oral   Take 1 tablet (100 mg total) by mouth at bedtime.   30 tablet   0     Allergies Review of patient's allergies indicates no known allergies.  Family History  Problem Relation Age of Onset  . Stroke Mother   . Diabetes type II Mother   . Hypertension Mother   . Hypertension Father   . Arthritis Father   . Arthritis Sister   . Diabetes type II Sister   . Hypertension Sister     Social History Social History  Substance Use Topics  . Smoking status: Current Every Day Smoker -- 0.50 packs/day for 15 years    Types: Cigars  . Smokeless tobacco: None  . Alcohol Use: 25.8 oz/week    40 Cans of beer,  3 Glasses of wine per week     Comment: occasionally; reports 1 pint of vodka per month at paydaysocially    Review of Systems Constitutional: No fever/chills Eyes: No visual changes. ENT: No sore throat. Cardiovascular: Severe substernal chest pain radiating to the back in the setting of cocaine abuse and trying Viagra for the  first time. Respiratory: Denies shortness of breath. Gastrointestinal: No abdominal pain.  Nausea and abdominal distention and burping a lot (acid reflux symptoms).  No vomiting. Genitourinary: Negative for dysuria. Musculoskeletal: Negative for back pain. Skin: Negative for rash. Neurological: Negative for headaches, focal weakness or numbness.  10-point ROS otherwise negative.  ____________________________________________   PHYSICAL EXAM:  VITAL SIGNS: ED Triage Vitals  Enc Vitals Group     BP 11/05/14 0232 191/130 mmHg     Pulse Rate 11/05/14 0232 100     Resp 11/05/14 0305 22     Temp 11/05/14 0232 98.3 F (36.8 C)     Temp Source 11/05/14 0232 Oral     SpO2 11/05/14 0232 100 %     Weight 11/05/14 0232 250 lb (113.399 kg)     Height 11/05/14 0232  (1.803 m)     Head Cir --      Peak Flow --      Pain Score 11/05/14 0233 7     Pain Loc --      Pain Edu? --      Excl. in GC? --     Constitutional: Alert and oriented.  Somewhat disheveled, in mild distress but able to converse in complete sentences. Eyes: PERRL. EOMI. Mody sclera. Head: Atraumatic. Nose: No congestion/rhinnorhea. Mouth/Throat: Mucous membranes are moist.  Oropharynx non-erythematous. Neck: No stridor.   Cardiovascular: Tachycardia, regular rhythm. Grossly normal heart sounds.  Good peripheral circulation. Respiratory: Normal respiratory effort.  No retractions. Lungs CTAB. Gastrointestinal: Soft and nontender. No distention. No abdominal bruits or pulsatile masses. No CVA tenderness. Musculoskeletal: No lower extremity tenderness nor edema.  No joint  effusions. Neurologic:  Normal speech and language. No gross focal neurologic deficits are appreciated.  Skin:  Skin is warm, dry and intact. No rash noted. Psychiatric: Mood and affect are somewhat anxious. Speech and behavior are normal.  ____________________________________________   LABS (all labs ordered are listed, but only abnormal results are displayed)  Labs Reviewed  CBC - Abnormal; Notable for the following:    WBC 17.8 (*)    All other components within normal limits  COMPREHENSIVE METABOLIC PANEL - Abnormal; Notable for the following:    Potassium 3.4 (*)    Glucose, Bld 117 (*)    Creatinine, Ser 1.35 (*)    ALT 13 (*)    GFR calc non Af Amer 58 (*)    All other components within normal limits  TROPONIN I - Abnormal; Notable for the following:    Troponin I 0.07 (*)    All other components within normal limits  APTT  MAGNESIUM  PROTIME-INR  URINE DRUG SCREEN, QUALITATIVE ([facility] ONLY)   ____________________________________________  EKG  ED ECG REPORT I, Markale Birdsell, the attending physician, personally viewed and interpreted this ECG.   Date: 11/05/2014  EKG Time: 02:29  Rate: 98  Rhythm: normal sinus rhythm  Axis: Left axis deviation  Intervals:Normal  ST&T Change: Nonspecific changes including inverted T waves in the lateral leads, increased T-wave amplitude in leads 12 and 3 and aVF, inverted T-wave in aVL.  The morphology is similar to prior EKGs but with more significant changes consistent with mild acute ischemia.  It does not qualify as a STEMI.  ____________________________________________  RADIOLOGY   Dg Chest Portable 1 View  11/05/2014   CLINICAL DATA:  Acute onset of midsternal sharp chest pain, radiating to the back. Initial encounter.  EXAM: PORTABLE CHEST 1 VIEW  COMPARISON:  Chest radiograph performed 10/19/2014  FINDINGS: The lungs are well-aerated and clear. There is no evidence of focal opacification, pleural effusion or pneumothorax.   The cardiomediastinal silhouette is borderline normal in size. No acute osseous abnormalities are seen.  IMPRESSION: No acute cardiopulmonary process seen.   Electronically Signed   By: Roanna Raider M.D.   On: 11/05/2014 03:41    ____________________________________________   PROCEDURES  Procedure(s) performed: None  Critical Care performed: Yes, see critical care note(s)   CRITICAL CARE Performed by: Loleta Rose   Total critical care time: 30 minutes  Critical care time was exclusive of separately billable procedures and treating other patients.  Critical care was necessary to treat or prevent imminent or life-threatening deterioration.  Critical care was time spent personally by me on the following activities: development of treatment plan with patient and/or surrogate as well as nursing, discussions with consultants, evaluation of patient's response to treatment, examination of patient, obtaining history from patient or surrogate, ordering and performing treatments and interventions, ordering and review of laboratory studies, ordering and review of radiographic studies, pulse oximetry and re-evaluation of patient's condition.  ____________________________________________   INITIAL IMPRESSION / ASSESSMENT AND PLAN / ED COURSE  Pertinent labs & imaging results that were available during my care of the patient were reviewed by me and considered in my medical decision making (see chart for details).  Acute severe chest pain in the setting of cocaine abuse and known coronary artery disease.  Approximately 2 hours after the onset of symptoms he has a mildly positive troponin.  I cannot give him beta blockers due to the cocaine use nor nitroglycerin due to the Viagra use.  I will treat him with morphine and Ativan as needed as well as 500 mL of normal saline IV bolus and potassium repletion (potassium is 3.4).  Admit him for further management of NSTEMI likely secondary to cocaine  abuse.  I will start heparin but without a bolus given the probability that this is not a thrombotic event.  I will also give him an aspirin full dose.  I considered aortic pathology in my differential but given that the radiologist feels the chest x-ray has normal appearance without a widened mediastinum and he is generally well-appearing, I will not further pursue this and will address it more as an ACS/cocaine induced chest pain.   ____________________________________________  FINAL CLINICAL IMPRESSION(S) / ED DIAGNOSES  Final diagnoses:  NSTEMI (non-ST elevated myocardial infarction) (HCC)  Cocaine abuse  Secondary hypertension, unspecified      NEW MEDICATIONS STARTED DURING THIS VISIT:  New Prescriptions   No medications on file     Loleta Rose, MD 11/05/14 [phone]

## 2014-11-05 NOTE — Progress Notes (Signed)
ANTICOAGULATION CONSULT NOTE - Initial Consult  Pharmacy Consult for heparin Indication: chest pain/ACS  No Known Allergies  Patient Measurements: Height:  (180.3 cm) Weight: 250 lb (113.399 kg) IBW/kg (Calculated) : 75.3 Heparin Dosing Weight: 99.9 kg  Vital Signs: Temp: 98.3 F (36.8 C) (10/01 0232) Temp Source: Oral (10/01 0232) BP: 213/139 mmHg (10/01 0330) Pulse Rate: 96 (10/01 0330)  Labs:  Recent Labs  11/05/14 0235  HGB 17.3  HCT 49.6  PLT 230  APTT 31  LABPROT 13.7  INR 1.03  CREATININE 1.35*  TROPONINI 0.07*    Estimated Creatinine Clearance: 80.1 mL/min (by C-G formula based on Cr of 1.35).   Medical History: Past Medical History  Diagnosis Date  . Hypertension   . Diabetes mellitus   . GERD (gastroesophageal reflux disease)   . Pericarditis   . Polysubstance abuse     cocaine, marijuana  . Auditory hallucinations   . Depression   . Hx of medication noncompliance   . History of suicide attempt   . Stroke [facility]) February 2015    Right MCA aneurysm; head MRI - acute infarct of the right frontal lobe. Smaller acute infarct of the left frontal lobe. Thought to be due to cerebral emboli. Left PICA infarct. Small chronic lacunar infarct right internal capsule. Chronic microvascular ischemic change with white matter change.  . Coronary artery disease     No reported catheterization. Negative Myoview stress test and 2013.  Marland Kitchen Syncope and collapse   . Bipolar disorder (HCC)   . Obesity, Class II, BMI 35-39.9, with comorbidity (HCC)   . Hypertensive hypertrophic cardiomyopathy [facility]) February 2015    Severe concentric LVH on echocardiogram.  . Aortic stenosis, mild February 2015    Medications:  Infusions:  . heparin      Assessment: 54 yom cc chest pain after drug ingestion. Substernal chest pain, sharp, severe, radiates through back. Troponin elevated, starting heparin drip for ACS. Betablocker contraindicated for cocaine use and  nitroglycerin contraindicated for viagra. No bolus for heparin due to low risk for thromboembolic event.   Goal of Therapy:  Heparin level 0.3-0.7 units/ml Monitor platelets by anticoagulation protocol: Yes   Plan:  No bolus Start heparin infusion at 1300 units/hr Check anti-Xa level in 6 hours and daily while on heparin Continue to monitor H&H and platelets  Carola Frost, Pharm.D.  Clinical Pharmacist 11/05/2014,4:00 AM

## 2014-11-05 NOTE — Consult Note (Signed)
Cardiology Consultation Note  Patient ID: [name] Kunert., MRN: [id number], DOB/AGE: 1960-07-11 54 y.o. Admit date: 11/05/2014   Date of Consult: 11/05/2014 Primary Physician: Preston Fleeting, MD Primary Cardiologist:   Chief Complaint: Angina pectoris Reason for Consult: Unstable angina  HPI: 54 y.o. male with h/o hypertension, poorly controlled diabetes with hemoglobin A1c greater than 10, CVAs, polysubstance abuse with cocaine, bipolar disorder, hyperlipidemia, obesity, admitted to the hospital 05/12/2013 with shortness of breath and chest pain, cardiac catheterization at that time with mild ostial ramus disease,  stroke with admission to the hospital 03/2013 in the setting of severe hypertension, presenting with chest discomfort, severe hypertension. Cardiology was consult in for angina.  Patient reports that he recently took cocaine, also took Viagra. He uses cocaine every 2 weeks. He is inquiring about getting "help for his cocaine" Reports chest pain has currently resolved. He was started on heparin infusion for chest pain symptoms and concern of ischemia. Cardiac enzymes negative 2  Other past medical history Previous Echocardiogram showed normal ejection fraction with severe concentric LVH, diastolic dysfunction, unable to exclude ASD/PFO  Prior stroke in 03/24/2013 was an acute right frontal MCA lesion, embolic versus atherosclerotic TEE was recommended but the patient refused Previous Drug test positive for cocaine and MDMA    Past Medical History  Diagnosis Date  . Hypertension   . Diabetes mellitus   . GERD (gastroesophageal reflux disease)   . Pericarditis   . Polysubstance abuse     cocaine, marijuana  . Auditory hallucinations   . Depression   . Hx of medication noncompliance   . History of suicide attempt   . Stroke [facility]) February 2015    Right MCA aneurysm; head MRI - acute infarct of the right frontal lobe. Smaller acute infarct of the left frontal  lobe. Thought to be due to cerebral emboli. Left PICA infarct. Small chronic lacunar infarct right internal capsule. Chronic microvascular ischemic change with white matter change.  . Coronary artery disease     No reported catheterization. Negative Myoview stress test and 2013.  Marland Kitchen Syncope and collapse   . Bipolar disorder (HCC)   . Obesity, Class II, BMI 35-39.9, with comorbidity (HCC)   . Hypertensive hypertrophic cardiomyopathy [facility]) February 2015    Severe concentric LVH on echocardiogram.  . Aortic stenosis, mild February 2015      Most Recent Cardiac Studies: Echocardiogram pending    Surgical History:  Past Surgical History  Procedure Laterality Date  . Pericardial fluid drainage    . Transthoracic echocardiogram  03/23/2013    EF 5560%. Elevated left atrial pressure. Severe concentric LVH. Mild aortic stenosis. -- Appearance of hypertensive hypertrophic heart disease.  No clear cardioembolic source  . Nm myoview ltd  July 2013    No ischemia or infarct. There is inferior wall defec suggestive of diaphragmatic attenuation. Mild LV dilation at to be due to hypertensive heart disease. EF 55%. Poor exercise tolerance.  . Carotid dopplers:  03/23/2013    Interval thickening of bilateral common carotid no significant plaque formation. Tortuosity but no significant stenosis. Patent bilateral vertebral  arteries     Home Meds: Prior to Admission medications   Medication Sig Start Date End Date Taking? Authorizing Provider  amLODipine (NORVASC) 10 MG tablet Take 10 mg by mouth daily.   Yes Historical Provider, MD  aspirin EC 81 MG EC tablet Take 1 tablet (81 mg total) by mouth daily. 07/21/14  Yes Adrian Saran, MD  atorvastatin (LIPITOR) 20 MG tablet  Take 1 tablet (20 mg total) by mouth daily at 6 PM. 10/21/14  Yes Alford Highland, MD  buPROPion (WELLBUTRIN XL) 150 MG 24 hr tablet Take 150 mg by mouth daily.   Yes Historical Provider, MD  busPIRone (BUSPAR) 10 MG tablet Take 10 mg by  mouth 2 (two) times daily.   Yes Historical Provider, MD  chlorthalidone (HYGROTON) 25 MG tablet Take 1 tablet (25 mg total) by mouth daily. 07/25/14  Yes Phineas Semen, MD  cloNIDine (CATAPRES) 0.2 MG tablet Take 0.2 mg by mouth 2 (two) times daily.    Yes Sanjuana Kava, NP  clopidogrel (PLAVIX) 75 MG tablet Take 1 tablet (75 mg total) by mouth daily. 07/21/14  Yes Adrian Saran, MD  diclofenac sodium (VOLTAREN) 1 % GEL Apply 2 g topically 4 (four) times daily.   Yes Historical Provider, MD  insulin aspart (NOVOLOG) 100 UNIT/ML injection Inject 10 Units into the skin 2 (two) times daily with a meal.   Yes Historical Provider, MD  lisinopril (PRINIVIL,ZESTRIL) 40 MG tablet Take 40 mg by mouth daily.   Yes Historical Provider, MD  metFORMIN (GLUCOPHAGE) 500 MG tablet Take 500 mg by mouth 2 (two) times daily.   Yes Historical Provider, MD  pantoprazole (PROTONIX) 40 MG tablet Take 40 mg by mouth daily.   Yes Historical Provider, MD  traZODone (DESYREL) 100 MG tablet Take 1 tablet (100 mg total) by mouth at bedtime. 10/21/14  Yes Alford Highland, MD    Inpatient Medications:  . amLODipine  10 mg Oral Daily  . aspirin EC  81 mg Oral Daily  . atorvastatin  20 mg Oral q1800  . buPROPion  150 mg Oral Daily  . busPIRone  10 mg Oral BID  . chlorthalidone  25 mg Oral Daily  . cloNIDine  0.2 mg Oral BID  . clopidogrel  75 mg Oral Daily  . insulin aspart  0-15 Units Subcutaneous TID WC  . lisinopril  40 mg Oral Daily  . LORazepam  1 mg Intravenous Once  . pantoprazole  40 mg Oral Daily  . sodium chloride  3 mL Intravenous Q12H  . sodium chloride  3 mL Intravenous Q12H  . traZODone  100 mg Oral QHS      Allergies: No Known Allergies  Social History   Social History  . Marital Status: Single    Spouse Name: N/A  . Number of Children: N/A  . Years of Education: N/A   Occupational History  . Not on file.   Social History Main Topics  . Smoking status: Current Every Day Smoker -- 0.50  packs/day for 15 years    Types: Cigars  . Smokeless tobacco: Not on file  . Alcohol Use: 25.8 oz/week    3 Glasses of wine, 40 Cans of beer per week     Comment: occasionally; reports 1 pint of vodka per month at paydaysocially  . Drug Use: 1.00 per week    Special: Marijuana, Cocaine     Comment: past  . Sexual Activity: Yes   Other Topics Concern  . Not on file   Social History Narrative   Single with no children.  No exercise.   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   Has a history of polysubstance abuse including cocaine. No she she urine toxicology screen in the hospital was positive.  He also current smoker.   He is seen by behavioral health for bipolar disorder.  Family History  Problem Relation Age of Onset  . Stroke Mother   . Diabetes type II Mother   . Hypertension Mother   . Hypertension Father   . Arthritis Father   . Arthritis Sister   . Diabetes type II Sister   . Hypertension Sister      Review of Systems: Review of Systems  Constitutional: Negative.   Respiratory: Negative.   Cardiovascular:       Chest pain resolved  Gastrointestinal: Negative.   Musculoskeletal: Negative.   Neurological: Negative.   All other systems reviewed and are negative.    Labs:  Recent Labs  11/05/14 0235 11/05/14 0713  TROPONINI 0.07* 0.08*   Lab Results  Component Value Date   WBC 14.3* 11/05/2014   HGB 15.7 11/05/2014   HCT 45.5 11/05/2014   MCV 91.0 11/05/2014   PLT 203 11/05/2014    Recent Labs Lab 11/05/14 0235 11/05/14 1317  NA 137 136  K 3.4* 3.6  CL 101 103  CO2 26 26  BUN 20 18  CREATININE 1.35* 1.21  CALCIUM 9.2 8.8*  PROT 7.9  --   BILITOT 1.1  --   ALKPHOS 63  --   ALT 13*  --   AST 23  --   GLUCOSE 117* 152*   Lab Results  Component Value Date   CHOL 124 10/21/2014   HDL 22* 10/21/2014   LDLCALC 72 10/21/2014   TRIG 149 10/21/2014   No results found for:  DDIMER  Radiology/Studies:  Ct Head Wo Contrast  10/19/2014   CLINICAL DATA:  Left side frontal headache for 1 week.  EXAM: CT HEAD WITHOUT CONTRAST  TECHNIQUE: Contiguous axial images were obtained from the base of the skull through the vertex without intravenous contrast.  COMPARISON:  07/20/2014  FINDINGS: Old left cerebellar infarct. Chronic microvascular changes throughout the deep white matter. No acute intracranial abnormality. Specifically, no hemorrhage, hydrocephalus, mass lesion, acute infarction, or significant intracranial injury. No acute calvarial abnormality. Visualized paranasal sinuses and mastoids clear. Orbital soft tissues unremarkable.  IMPRESSION: Old left cerebellar infarct.  No acute intracranial abnormality.   Electronically Signed   By: Charlett Nose M.D.   On: 10/19/2014 10:51   Mr Brain Wo Contrast  10/20/2014   CLINICAL DATA:  Headaches for 2 days.  Left hand and foot numbness.  EXAM: MRI HEAD WITHOUT CONTRAST  TECHNIQUE: Multiplanar, multiecho pulse sequences of the brain and surrounding structures were obtained without intravenous contrast.  COMPARISON:  Head CT 10/19/2014 and MRI 03/23/2013  FINDINGS: There is no evidence of acute infarct, mass, or extra-axial fluid collection. Predominantly subcortical encephalomalacia is present in the right frontal lobe corresponding to the acute infarct described on the prior MRI, with associated gliosis, small amount of chronic blood products, and mild ex vacuo dilatation of the anterior body and frontal horn of the right lateral ventricle. Patchy T2 hyperintensities in the subcortical and deep cerebral white matter elsewhere have slightly progressed from the prior spot MRI and are nonspecific but compatible with moderate chronic small vessel ischemic disease, advanced for age.  A chronic left PICA territory infarct is again seen. A chronic lacunar infarct is again noted in the posterior limb of the right internal capsule.  Orbits are  unremarkable. Mild bilateral ethmoid air cell mucosal thickening and a left maxillary sinus mucous retention cyst are noted. No significant mastoid effusion. Major intracranial vascular flow voids are preserved.  IMPRESSION: 1. No acute intracranial abnormality. 2. Chronic infarcts and chronic  small vessel ischemia in the cerebral white matter as above, slightly progressed from prior MRI.   Electronically Signed   By: Sebastian Ache M.D.   On: 10/20/2014 17:18   US Carotid Bilateral  10/19/2014   CLINICAL DATA:  54 year old male with a history of cerebral vascular accident.  Cardiovascular risk factors include hypertension, stroke/ TIA, tobacco use, diabetes.  EXAM: BILATERAL CAROTID DUPLEX ULTRASOUND  TECHNIQUE: Wallace Cullens scale imaging, color Doppler and duplex ultrasound were performed of bilateral carotid and vertebral arteries in the neck.  COMPARISON:  CTA neck 03/23/2013  FINDINGS: Criteria: Quantification of carotid stenosis is based on velocity parameters that correlate the residual internal carotid diameter with NASCET-based stenosis levels, using the diameter of the distal internal carotid lumen as the denominator for stenosis measurement.  The following velocity measurements were obtained:  RIGHT  ICA:  Systolic 66 cm/sec, Diastolic 25 cm/sec  CCA:  101 cm/sec  SYSTOLIC ICA/CCA RATIO:  0.7  ECA:  130 cm/sec  LEFT  ICA:  Systolic 49 cm/sec, Diastolic 13 cm/sec  CCA:  95 cm/sec  SYSTOLIC ICA/CCA RATIO:  0.5  ECA:  129 cm/sec  Right Brachial SBP: Not acquired  Left Brachial SBP: Not acquired  RIGHT CAROTID ARTERY: No significant calcified disease of the right common carotid artery. Intermediate waveform maintained. Heterogeneous plaque without significant calcifications at the right carotid bifurcation. Low resistance waveform of the right ICA. No significant tortuosity.  RIGHT VERTEBRAL ARTERY: Antegrade flow with low resistance waveform.  LEFT CAROTID ARTERY: No significant calcified disease of the left common  carotid artery. Intermediate waveform maintained. Heterogeneous plaque at the left carotid bifurcation without significant calcifications. Low resistance waveform of the left ICA.  LEFT VERTEBRAL ARTERY:  Antegrade flow with low resistance waveform.  IMPRESSION: Color duplex indicates minimal heterogeneous plaque, with no hemodynamically significant stenosis by duplex criteria in the extracranial cerebrovascular circulation.  Signed,  Yvone Neu. Loreta Ave, DO  [facility]  [facility]   Electronically Signed   By: Gilmer Mor D.O.   On: 10/19/2014 17:00   Dg Chest Portable 1 View  11/05/2014   CLINICAL DATA:  Acute onset of midsternal sharp chest pain, radiating to the back. Initial encounter.  EXAM: PORTABLE CHEST 1 VIEW  COMPARISON:  Chest radiograph performed 10/19/2014  FINDINGS: The lungs are well-aerated and clear. There is no evidence of focal opacification, pleural effusion or pneumothorax.  The cardiomediastinal silhouette is borderline normal in size. No acute osseous abnormalities are seen.  IMPRESSION: No acute cardiopulmonary process seen.   Electronically Signed   By: Roanna Raider M.D.   On: 11/05/2014 03:41   Dg Chest Portable 1 View  10/19/2014   CLINICAL DATA:  Headache for 1 week. History of hypertension, coronary artery disease and diabetes. Initial encounter.  EXAM: PORTABLE CHEST - 1 VIEW  COMPARISON:  07/21/2014.  FINDINGS: 1039 hours. The heart size and mediastinal contours are normal. The lungs are clear. There is no pleural effusion or pneumothorax. No acute osseous findings are identified. Telemetry leads overlie the chest.  IMPRESSION: Stable chest.  No active cardiopulmonary process.   Electronically Signed   By: Carey Bullocks M.D.   On: 10/19/2014 10:54    EKG: EKG shows normal sinus rhythm with rate 92 beats per minute, nonspecific T wave abnormality in the anterolateral leads.  Unchanged from prior EKGs   Weights: Filed  Weights   11/05/14 0232  Weight: 250 lb (113.399 kg)     Physical Exam: Normal sinus  rhythm on telemetry Blood pressure 149/111, pulse 78, temperature 98.7 F (37.1 C), temperature source Oral, resp. rate 14, height  (1.803 m), weight 250 lb (113.399 kg), SpO2 96 %. Body mass index is 34.88 kg/(m^2). General: Well developed, well nourished, in no acute distress. Sleeping, appears very comfortable  Head: Normocephalic, atraumatic, sclera non-icteric, no xanthomas, nares are without discharge.  Neck: Negative for carotid bruits. JVD not elevated. Lungs: Clear bilaterally to auscultation without wheezes, rales, or rhonchi. Breathing is unlabored. Heart: RRR with S1 S2. No murmurs, rubs, or gallops appreciated. Abdomen: Soft, non-tender, non-distended with normoactive bowel sounds. No hepatomegaly. No rebound/guarding. No obvious abdominal masses. Msk:  Strength and tone appear normal for age. Extremities: No clubbing or cyanosis. No edema.  Distal pedal pulses are 2+ and equal bilaterally. Neuro: Alert and oriented X 3. No facial asymmetry. No focal deficit. Moves all extremities spontaneously. Psych:  Responds to questions appropriately with a normal affect.    Assessment and Plan:  54 y.o. male with h/o hypertension, poorly controlled diabetes with hemoglobin A1c greater than 10, CVAs, polysubstance abuse with cocaine, bipolar disorder, hyperlipidemia, obesity,  cardiac catheterization in 2015 with mild ostial ramus disease , as well as hospitalizations for severe hypertension,  Cardiology was consult in for angina.  1) chest pain Atypical in nature, likely secondary to cocaine Cardiac enzymes negative, EKG unchanged from prior studies Cardiac catheterization last year given his history of chronic chest pain with minimal coronary artery disease of a ramus branch. Currently pain-free --- No further testing needed at this time. --We will discontinue heparin infusion --Would obtain  third troponin. If this lasts troponin is normal,  no further workup  2) malignant hypertension Patient compliance in question Blood pressure has improved on outpatient medication regimen He does report that he has his medications at home Would continue current regimen, monitor --If blood pressure continues to run high, could add hydralazine  3) drug abuse/cocaine abuse Chronic issue, reports using cocaine every 2 weeks Counseling provided Needs outpatient program  4) diabetes type 2, poorly controlled Needs outpatient referral to endocrine or primary care Suspect that patient compliance is an issue  5) history of stroke Likely secondary to chronic drug abuse  Unable to exclude PFO Would continue low-dose aspirin  6) LVH Previously was noted to be significant, Likely secondary to chronic hypertension.   Signed, Dossie Arbour, MD [facility] 11/05/2014, 3:48 PM

## 2014-11-05 NOTE — ED Notes (Signed)
Pt reports midsternal, sharp CP starting approx 2 hours ago, radiates to back.

## 2014-11-05 NOTE — Progress Notes (Signed)
*  PRELIMINARY RESULTS* Echocardiogram 2D Echocardiogram has been performed.  [name] [name] 11/05/2014, 2:28 PM

## 2014-11-05 NOTE — Progress Notes (Signed)
ANTICOAGULATION CONSULT NOTE   Pharmacy Consult for heparin Indication: chest pain/ACS  No Known Allergies  Patient Measurements: Height:  (180.3 cm) Weight: 250 lb (113.399 kg) IBW/kg (Calculated) : 75.3 Heparin Dosing Weight: 99.9 kg  Vital Signs: Temp: 98.7 F (37.1 C) (10/01 1305) Temp Source: Oral (10/01 1305) BP: 149/111 mmHg (10/01 1305) Pulse Rate: 78 (10/01 1305)  Labs:  Recent Labs  11/05/14 0235 11/05/14 0713 11/05/14 1132 11/05/14 1317  HGB 17.3  --   --  15.7  HCT 49.6  --   --  45.5  PLT 230  --   --  203  APTT 31  --   --   --   LABPROT 13.7  --   --   --   INR 1.03  --   --   --   HEPARINUNFRC  --   --  0.23*  --   CREATININE 1.35*  --   --  1.21  TROPONINI 0.07* 0.08*  --   --     Estimated Creatinine Clearance: 89.3 mL/min (by C-G formula based on Cr of 1.21).   Medical History: Past Medical History  Diagnosis Date  . Hypertension   . Diabetes mellitus   . GERD (gastroesophageal reflux disease)   . Pericarditis   . Polysubstance abuse     cocaine, marijuana  . Auditory hallucinations   . Depression   . Hx of medication noncompliance   . History of suicide attempt   . Stroke [facility]) February 2015    Right MCA aneurysm; head MRI - acute infarct of the right frontal lobe. Smaller acute infarct of the left frontal lobe. Thought to be due to cerebral emboli. Left PICA infarct. Small chronic lacunar infarct right internal capsule. Chronic microvascular ischemic change with white matter change.  . Coronary artery disease     No reported catheterization. Negative Myoview stress test and 2013.  Marland Kitchen Syncope and collapse   . Bipolar disorder (HCC)   . Obesity, Class II, BMI 35-39.9, with comorbidity (HCC)   . Hypertensive hypertrophic cardiomyopathy [facility]) February 2015    Severe concentric LVH on echocardiogram.  . Aortic stenosis, mild February 2015    Medications:  Infusions:  . heparin 1,300 Units/hr (11/05/14 0508)     Assessment: 54 yom cc chest pain after drug ingestion. Substernal chest pain, sharp, severe, radiates through back. Troponin elevated, starting heparin drip for ACS. Betablocker contraindicated for cocaine use and nitroglycerin contraindicated for viagra. No bolus for heparin due to low risk for thromboembolic event.   Goal of Therapy:  Heparin level 0.3-0.7 units/ml Monitor platelets by anticoagulation protocol: Yes   Plan:  Will bolus 1500 units x 1. Will increase rate to heparin 1500 units/hr (102mL/hr). Will obtain follow-up Anti-Xa at 2100.   Pharmacy will continue to monitor and adjust per consult.    Kizzy Olafson L, Pharm.D.  Clinical Pharmacist 11/05/2014,3:03 PM

## 2014-11-06 ENCOUNTER — Inpatient Hospital Stay
Admit: 2014-11-06 | Discharge: 2014-11-11 | DRG: 885 | Disposition: A | Discharge status: Home / Self Care | Payer: Medicaid Other | Source: Ambulatory Visit | Attending: Psychiatry | Admitting: Psychiatry

## 2014-11-06 DIAGNOSIS — G4700 Insomnia, unspecified: Secondary | ICD-10-CM | POA: Diagnosis present

## 2014-11-06 DIAGNOSIS — Z794 Long term (current) use of insulin: Secondary | ICD-10-CM | POA: Diagnosis not present

## 2014-11-06 DIAGNOSIS — R296 Repeated falls: Secondary | ICD-10-CM | POA: Diagnosis present

## 2014-11-06 DIAGNOSIS — I517 Cardiomegaly: Secondary | ICD-10-CM | POA: Diagnosis present

## 2014-11-06 DIAGNOSIS — E669 Obesity, unspecified: Secondary | ICD-10-CM | POA: Diagnosis present

## 2014-11-06 DIAGNOSIS — F1020 Alcohol dependence, uncomplicated: Secondary | ICD-10-CM | POA: Diagnosis present

## 2014-11-06 DIAGNOSIS — Z811 Family history of alcohol abuse and dependence: Secondary | ICD-10-CM

## 2014-11-06 DIAGNOSIS — F17200 Nicotine dependence, unspecified, uncomplicated: Secondary | ICD-10-CM | POA: Diagnosis present

## 2014-11-06 DIAGNOSIS — E785 Hyperlipidemia, unspecified: Secondary | ICD-10-CM | POA: Diagnosis present

## 2014-11-06 DIAGNOSIS — K219 Gastro-esophageal reflux disease without esophagitis: Secondary | ICD-10-CM | POA: Diagnosis present

## 2014-11-06 DIAGNOSIS — Z7902 Long term (current) use of antithrombotics/antiplatelets: Secondary | ICD-10-CM

## 2014-11-06 DIAGNOSIS — IMO0002 Reserved for concepts with insufficient information to code with codable children: Secondary | ICD-10-CM | POA: Diagnosis present

## 2014-11-06 DIAGNOSIS — I2510 Atherosclerotic heart disease of native coronary artery without angina pectoris: Secondary | ICD-10-CM | POA: Diagnosis present

## 2014-11-06 DIAGNOSIS — F332 Major depressive disorder, recurrent severe without psychotic features: Principal | ICD-10-CM | POA: Diagnosis present

## 2014-11-06 DIAGNOSIS — Z8249 Family history of ischemic heart disease and other diseases of the circulatory system: Secondary | ICD-10-CM

## 2014-11-06 DIAGNOSIS — Z8673 Personal history of transient ischemic attack (TIA), and cerebral infarction without residual deficits: Secondary | ICD-10-CM

## 2014-11-06 DIAGNOSIS — Z9119 Patient's noncompliance with other medical treatment and regimen: Secondary | ICD-10-CM | POA: Diagnosis not present

## 2014-11-06 DIAGNOSIS — Z79899 Other long term (current) drug therapy: Secondary | ICD-10-CM | POA: Diagnosis not present

## 2014-11-06 DIAGNOSIS — R45851 Suicidal ideations: Secondary | ICD-10-CM | POA: Diagnosis present

## 2014-11-06 DIAGNOSIS — F1590 Other stimulant use, unspecified, uncomplicated: Secondary | ICD-10-CM

## 2014-11-06 DIAGNOSIS — Z7982 Long term (current) use of aspirin: Secondary | ICD-10-CM | POA: Diagnosis not present

## 2014-11-06 DIAGNOSIS — Z833 Family history of diabetes mellitus: Secondary | ICD-10-CM

## 2014-11-06 DIAGNOSIS — Z915 Personal history of self-harm: Secondary | ICD-10-CM | POA: Diagnosis not present

## 2014-11-06 DIAGNOSIS — Z9181 History of falling: Secondary | ICD-10-CM | POA: Diagnosis not present

## 2014-11-06 DIAGNOSIS — Z6835 Body mass index (BMI) 35.0-35.9, adult: Secondary | ICD-10-CM

## 2014-11-06 DIAGNOSIS — Z9889 Other specified postprocedural states: Secondary | ICD-10-CM | POA: Diagnosis not present

## 2014-11-06 DIAGNOSIS — F1490 Cocaine use, unspecified, uncomplicated: Secondary | ICD-10-CM | POA: Diagnosis present

## 2014-11-06 DIAGNOSIS — I350 Nonrheumatic aortic (valve) stenosis: Secondary | ICD-10-CM

## 2014-11-06 DIAGNOSIS — I639 Cerebral infarction, unspecified: Secondary | ICD-10-CM | POA: Diagnosis present

## 2014-11-06 DIAGNOSIS — I119 Hypertensive heart disease without heart failure: Secondary | ICD-10-CM | POA: Diagnosis present

## 2014-11-06 DIAGNOSIS — Z823 Family history of stroke: Secondary | ICD-10-CM

## 2014-11-06 DIAGNOSIS — E1165 Type 2 diabetes mellitus with hyperglycemia: Secondary | ICD-10-CM | POA: Diagnosis present

## 2014-11-06 DIAGNOSIS — F419 Anxiety disorder, unspecified: Secondary | ICD-10-CM | POA: Diagnosis present

## 2014-11-06 DIAGNOSIS — I10 Essential (primary) hypertension: Secondary | ICD-10-CM | POA: Diagnosis present

## 2014-11-06 LAB — CBC
HCT: 40.9 % (ref 40.0–52.0)
Hemoglobin: 14.4 g/dL (ref 13.0–18.0)
MCH: 32 pg (ref 26.0–34.0)
MCHC: 35.2 g/dL (ref 32.0–36.0)
MCV: 91.1 fL (ref 80.0–100.0)
PLATELETS: 186 10*3/uL (ref 150–440)
RBC: 4.49 MIL/uL (ref 4.40–5.90)
RDW: 12.8 % (ref 11.5–14.5)
WBC: 8.8 10*3/uL (ref 3.8–10.6)

## 2014-11-06 LAB — GLUCOSE, CAPILLARY
GLUCOSE-CAPILLARY: 222 mg/dL — AB (ref 65–99)
GLUCOSE-CAPILLARY: 256 mg/dL — AB (ref 65–99)
GLUCOSE-CAPILLARY: 202 mg/dL — AB (ref 65–99)

## 2014-11-06 MED ORDER — ACETAMINOPHEN 325 MG PO TABS
650.0000 mg | ORAL_TABLET | Freq: Four times a day (QID) | ORAL | Status: DC | PRN
  Administered 2014-11-07 – 2014-11-11 (×3): 650 mg via ORAL
  Filled 2014-11-06 (×3): qty 2

## 2014-11-06 MED ORDER — IBUPROFEN 200 MG PO TABS
400.0000 mg | ORAL_TABLET | Freq: Four times a day (QID) | ORAL | Status: DC | PRN
Start: 2014-11-06 — End: 2014-11-06

## 2014-11-06 MED ORDER — ALUM & MAG HYDROXIDE-SIMETH 200-200-20 MG/5ML PO SUSP: 30.0000 mL | ORAL | Status: DC | PRN

## 2014-11-06 MED ORDER — TRAZODONE HCL 50 MG PO TABS
150.0000 mg | ORAL_TABLET | Freq: Every day | ORAL | Status: DC
  Administered 2014-11-06 – 2014-11-10 (×5): 150 mg via ORAL
  Filled 2014-11-06 (×5): qty 1

## 2014-11-06 MED ORDER — MAGNESIUM HYDROXIDE 400 MG/5ML PO SUSP: 30.0000 mL | Freq: Every day | ORAL | Status: DC | PRN

## 2014-11-06 NOTE — Progress Notes (Signed)
D: patient admitted to the unit with SI without a plan.  Patient has a hx of drug use and alcohol use.  Patient denies any SI at this time.  Patient denies any auditory or visual hallucinations at this time.  Patient in scrubs.  Patient states he is anxious at this time.  Patient pleasant and in no distress at this time.  A:  Skin and belongings searched completed with no contraband found.  Vitals done.  And oriented patient to the unit and the room.  R: patient receptive of information.

## 2014-11-06 NOTE — Progress Notes (Signed)
Patient awaiting bed for discharge to behavioural unit.Discharge instructions explained and well understood,vital signs within normal limits.

## 2014-11-06 NOTE — Progress Notes (Signed)
A&O,. Up with standby assist. On RA. SR on tele. Tylenol and maalox given for HA and heartburn. IV hydralazine given last night for increased BP. BP in stable now.

## 2014-11-06 NOTE — Discharge Instructions (Addendum)
°  DIET:  Cardiac diet  DISCHARGE CONDITION:  Stable  ACTIVITY:  Activity as tolerated  OXYGEN:  Home Oxygen: No.   Oxygen Delivery: room air  DISCHARGE LOCATION:  home   If you experience worsening of your admission symptoms, develop shortness of breath, life threatening emergency, suicidal or homicidal thoughts you must seek medical attention immediately by calling 911 or calling your MD immediately  if symptoms less severe.  You Must read complete instructions/literature along with all the possible adverse reactions/side effects for all the Medicines you take and that have been prescribed to you. Take any new Medicines after you have completely understood and accpet all the possible adverse reactions/side effects.   Please note  You were cared for by a hospitalist during your hospital stay. If you have any questions about your discharge medications or the care you received while you were in the hospital after you are discharged, you can call the unit and asked to speak with the hospitalist on call if the hospitalist that took care of you is not available. Once you are discharged, your primary care physician will handle any further medical issues. Please note that NO REFILLS for any discharge medications will be authorized once you are discharged, as it is imperative that you return to your primary care physician (or establish a relationship with a primary care physician if you do not have one) for your aftercare needs so that they can reassess your need for medications and monitor your lab values.   QUIT TOBACCO AND COCAINE

## 2014-11-06 NOTE — Progress Notes (Signed)
This Clinical research associate provided the admission information to Nicole Cella in registration and she agreed to pre-admit the patient.   Maryelizabeth Rowan, MSW, [facility] Triage Specialist [phone]

## 2014-11-06 NOTE — BH Assessment (Signed)
Assessment Note  [name] [name]. is an 54 y.o. male. Patient reports suicidal ideations with plan shoot self and has access to a gun at home.  Patient denies homicidal ideations, auditory hallucination, and other self-injurious behaviors.  Patient reports visual hallucinations of old people walking around but not at the time of this interview.  Patient reports drinking alcohol and smoking cocaine daily.    This Clinical research associate consulted with Dr. Guss Bunde it is recommended to refer for inpatient treatment.  Per Daron Offer, RN patient accepted to bed 305A and can be transferred at this time.  This Clinical research associate informed Micah Noel, RN of the updated disposition and ability to transfer.     Diagnosis: 295.70, F25.0 Schizoaffective disorder, Bipolar type; 303.90, F10.20 Alcohol use disorder, Severe; 304.20,F14.20 Cocaine use disorder, Severe   Past Medical History:  Past Medical History  Diagnosis Date  . Hypertension   . Diabetes mellitus   . GERD (gastroesophageal reflux disease)   . Pericarditis   . Polysubstance abuse     cocaine, marijuana  . Auditory hallucinations   . Depression   . Hx of medication noncompliance   . History of suicide attempt   . Stroke [facility]) February 2015    Right MCA aneurysm; head MRI - acute infarct of the right frontal lobe. Smaller acute infarct of the left frontal lobe. Thought to be due to cerebral emboli. Left PICA infarct. Small chronic lacunar infarct right internal capsule. Chronic microvascular ischemic change with white matter change.  . Coronary artery disease     No reported catheterization. Negative Myoview stress test and 2013.  Marland Kitchen Syncope and collapse   . Bipolar disorder (HCC)   . Obesity, Class II, BMI 35-39.9, with comorbidity (HCC)   . Hypertensive hypertrophic cardiomyopathy [facility]) February 2015    Severe concentric LVH on echocardiogram.  . Aortic stenosis, mild February 2015    Past Surgical History  Procedure Laterality Date  . Pericardial fluid drainage    .  Transthoracic echocardiogram  03/23/2013    EF 5560%. Elevated left atrial pressure. Severe concentric LVH. Mild aortic stenosis. -- Appearance of hypertensive hypertrophic heart disease.  No clear cardioembolic source  . Nm myoview ltd  July 2013    No ischemia or infarct. There is inferior wall defec suggestive of diaphragmatic attenuation. Mild LV dilation at to be due to hypertensive heart disease. EF 55%. Poor exercise tolerance.  . Carotid dopplers:  03/23/2013    Interval thickening of bilateral common carotid no significant plaque formation. Tortuosity but no significant stenosis. Patent bilateral vertebral  arteries    Family History:  Family History  Problem Relation Age of Onset  . Stroke Mother   . Diabetes type II Mother   . Hypertension Mother   . Hypertension Father   . Arthritis Father   . Arthritis Sister   . Diabetes type II Sister   . Hypertension Sister     Social History:  reports that he has been smoking Cigars.  He does not have any smokeless tobacco history on file. He reports that he drinks about 25.8 oz of alcohol per week. He reports that he uses illicit drugs (Marijuana and Cocaine) about once per week.  Additional Social History:  Alcohol / Drug Use Pain Medications: See MARs Prescriptions: See MARs Over the Counter: See MARs History of alcohol / drug use?: Yes Longest period of sobriety (when/how long): 30 days Negative Consequences of Use: Financial, Legal, Personal relationships, Work / School Withdrawal Symptoms: Sweats, Irritability, Nausea / Vomiting,  Patient aware of relationship between substance abuse and physical/medical complications, Weakness, Tremors Substance #1 Name of Substance 1: Alcohol 1 - Age of First Use: 20 1 - Amount (size/oz): fifth 1 - Frequency: daily 1 - Duration: on/off 1 - Last Use / Amount: 9/30 Substance #2 Name of Substance 2: cocaine 2 - Age of First Use: 20 2 - Amount (size/oz): varies 2 - Frequency: vaires 2 -  Duration: on/off 2 - Last Use / Amount: varies  CIWA: CIWA-Ar BP: 107/63 mmHg Pulse Rate: 69 Nausea and Vomiting: no nausea and no vomiting Tactile Disturbances: none Tremor: no tremor Auditory Disturbances: not present Paroxysmal Sweats: no sweat visible Visual Disturbances: not present Anxiety: no anxiety, at ease Headache, Fullness in Head: none present Agitation: normal activity Orientation and Clouding of Sensorium: oriented and can do serial additions CIWA-Ar Total: 0 COWS:    Allergies: No Known Allergies  Home Medications:  Medications Prior to Admission  Medication Sig Dispense Refill  . amLODipine (NORVASC) 10 MG tablet Take 10 mg by mouth daily.    Marland Kitchen aspirin EC 81 MG EC tablet Take 1 tablet (81 mg total) by mouth daily. 120 tablet 0  . atorvastatin (LIPITOR) 20 MG tablet Take 1 tablet (20 mg total) by mouth daily at 6 PM. 30 tablet 0  . buPROPion (WELLBUTRIN XL) 150 MG 24 hr tablet Take 150 mg by mouth daily.    . busPIRone (BUSPAR) 10 MG tablet Take 10 mg by mouth 2 (two) times daily.    . chlorthalidone (HYGROTON) 25 MG tablet Take 1 tablet (25 mg total) by mouth daily. 30 tablet 0  . cloNIDine (CATAPRES) 0.2 MG tablet Take 0.2 mg by mouth 2 (two) times daily.     . clopidogrel (PLAVIX) 75 MG tablet Take 1 tablet (75 mg total) by mouth daily. 30 tablet 0  . diclofenac sodium (VOLTAREN) 1 % GEL Apply 2 g topically 4 (four) times daily.    . insulin aspart (NOVOLOG) 100 UNIT/ML injection Inject 10 Units into the skin 2 (two) times daily with a meal.    . lisinopril (PRINIVIL,ZESTRIL) 40 MG tablet Take 40 mg by mouth daily.    . metFORMIN (GLUCOPHAGE) 500 MG tablet Take 500 mg by mouth 2 (two) times daily.    . pantoprazole (PROTONIX) 40 MG tablet Take 40 mg by mouth daily.    . traZODone (DESYREL) 100 MG tablet Take 1 tablet (100 mg total) by mouth at bedtime. 30 tablet 0    OB/GYN Status:  No LMP for male patient.  General Assessment Data Location of Assessment:  [facility] TTS Assessment: In system Is this a Tele or Face-to-Face Assessment?: Face-to-Face Is this an Initial Assessment or a Re-assessment for this encounter?: Initial Assessment Marital status: Single Maiden name: na Is patient pregnant?: No Pregnancy Status: No Living Arrangements: Alone Can pt return to current living arrangement?: Yes Admission Status: Voluntary Is patient capable of signing voluntary admission?: Yes Referral Source: Self/Family/Friend Insurance type: medicaid  Medical Screening Exam [facility] Walk-in ONLY) Medical Exam completed: Yes  Crisis Care Plan Living Arrangements: Alone Name of Psychiatrist: [facility] Name of Therapist: [facility]  Education Status Is patient currently in school?: No Current Grade: na Name of school: na Contact person: na  Risk to self with the past 6 months Suicidal Ideation: Yes-Currently Present Has patient been a risk to self within the past 6 months prior to admission? : Yes Suicidal Intent: Yes-Currently Present Has patient had any suicidal intent within the past  6 months prior to admission? : Yes Is patient at risk for suicide?: Yes Suicidal Plan?: Yes-Currently Present Has patient had any suicidal plan within the past 6 months prior to admission? : Yes Specify Current Suicidal Plan: shoot self Access to Means: Yes Specify Access to Suicidal Means: gun at home What has been your use of drugs/alcohol within the last 12 months?: alcohol, cocaine Previous Attempts/Gestures: Yes How many times?: 2 Other Self Harm Risks: none Triggers for Past Attempts: Other (Comment), Unpredictable (SA) Intentional Self Injurious Behavior: None Family Suicide History: No Recent stressful life event(s): Conflict (Comment), Loss (Comment), Financial Problems, Other (Comment) (SA) Persecutory voices/beliefs?: Yes Depression: Yes Depression Symptoms: Isolating, Fatigue, Guilt, Loss of interest in usual pleasures, Feeling worthless/self pity, Feeling  angry/irritable (hopelessness) Substance abuse history and/or treatment for substance abuse?: Yes  Risk to Others within the past 6 months Homicidal Ideation: No-Not Currently/Within Last 6 Months Does patient have any lifetime risk of violence toward others beyond the six months prior to admission? : No Thoughts of Harm to Others: No-Not Currently Present/Within Last 6 Months Current Homicidal Intent: No-Not Currently/Within Last 6 Months Current Homicidal Plan: No-Not Currently/Within Last 6 Months Access to Homicidal Means: No Identified Victim: na History of harm to others?: No Assessment of Violence: None Noted Violent Behavior Description: na Does patient have access to weapons?: Yes (Comment) Criminal Charges Pending?: No Does patient have a court date: No Is patient on probation?: No  Psychosis Hallucinations: Visual (old people walking) Delusions: None noted  Mental Status Report Appearance/Hygiene: In hospital gown Eye Contact: Fair Motor Activity: Freedom of movement Speech: Logical/coherent Level of Consciousness: Alert Mood: Depressed Affect: Depressed Anxiety Level: None Thought Processes: Relevant Judgement: Impaired Orientation: Person, Place, Time Obsessive Compulsive Thoughts/Behaviors: None  Cognitive Functioning Concentration: Fair Memory: Recent Intact, Remote Intact IQ: Average Insight: Fair Impulse Control: Fair Appetite: Fair Weight Loss: 0 Weight Gain: 0 Sleep: No Change Vegetative Symptoms: None  ADLScreening [facility] Assessment Services) Patient's cognitive ability adequate to safely complete daily activities?: Yes Patient able to express need for assistance with ADLs?: Yes Independently performs ADLs?: Yes (appropriate for developmental age)  Prior Inpatient Therapy Prior Inpatient Therapy: Yes Prior Therapy Dates: unknown Prior Therapy Facilty/Provider(s): [facility], [facility] Reason for Treatment: Schizoaffective  Prior Outpatient  Therapy Prior Outpatient Therapy: Yes Prior Therapy Dates: unknown Prior Therapy Facilty/Provider(s): [facility] Reason for Treatment: schizoaffecitive Does patient have an ACCT team?: Yes Does patient have Intensive In-House Services?  : No Does patient have [facility] services? : No Does patient have P4CC services?: No  ADL Screening (condition at time of admission) Patient's cognitive ability adequate to safely complete daily activities?: Yes Is the patient deaf or have difficulty hearing?: No Does the patient have difficulty seeing, even when wearing glasses/contacts?: No Does the patient have difficulty concentrating, remembering, or making decisions?: No Patient able to express need for assistance with ADLs?: Yes Does the patient have difficulty dressing or bathing?: No Independently performs ADLs?: Yes (appropriate for developmental age) Does the patient have difficulty walking or climbing stairs?: No Weakness of Legs: None Weakness of Arms/Hands: None  Home Assistive Devices/Equipment Home Assistive Devices/Equipment: None  Therapy Consults (therapy consults require a physician order) PT Evaluation Needed: No OT Evalulation Needed: No SLP Evaluation Needed: No Abuse/Neglect Assessment (Assessment to be complete while patient is alone) Physical Abuse: Denies Verbal Abuse: Denies Sexual Abuse: Denies Exploitation of patient/patient's resources: Denies Self-Neglect: Denies Values / Beliefs Cultural Requests During Hospitalization: None Spiritual Requests During Hospitalization: None Consults Spiritual  Care Consult Needed: No Social Work Consult Needed: No Merchant navy officer (For Healthcare) Does patient have an advance directive?: No Would patient like information on creating an advanced directive?: No - patient declined information Nutrition Screen- [facility] Adult/WL/AP Patient's home diet: Regular Has the patient recently lost weight without trying?: No Has the patient been  eating poorly because of a decreased appetite?: No Malnutrition Screening Tool Score: 0  Additional Information 1:1 In Past 12 Months?: No CIRT Risk: No Elopement Risk: No Does patient have medical clearance?: Yes     Disposition:  Disposition Initial Assessment Completed for this Encounter: Yes Disposition of Patient: Inpatient treatment program Type of inpatient treatment program: Adult  On Site Evaluation by:   Reviewed with Physician:    Maryelizabeth Rowan A 11/06/2014 5:43 PM

## 2014-11-06 NOTE — Progress Notes (Signed)
Per MD Sudini we may consult psychiatry for the patient; he expressed suicidal ideation when told he was being discharged,  Do not discontinue the discharge, pt should leave after the consult is complete

## 2014-11-06 NOTE — Consult Note (Signed)
  Pt seen in Consultation in Room No 242 [facility]. S Pt is a 54 yr old AA male who worked as a Merchandiser, retail many yrs ago. Never married and is single and lives by himself in an apt. Pt has a long H/O mental illness with depression and has been feeling anxious and abusing Cocaine for the past 10 yrs by smoking and came to [facility] with cc" chest pain." Pt was evaluated and was getting ready tor discharge and c/o suicidal ideas and a Consult was called. Past Psych history H/O Inpt to psychiatry on 3 previous occasions. Ist Inpt was in Jun 30, 1989 after his mother died to [facility]. Was inpt at [facility] on 2 occasions.  H/O suicide attempt once when he drank rat poison. Being followed by [facility] and [facility]Ct came and evaluated him at Home last Wednesday and they will; come back and follow him. ALOCHOL AND Drugs. Drinks alcohol at the rate of a pint of Vodka once in 2 wks. Had 4 DWIs and lost his DL.  Walks or friends give him rides. Smokes Cocaine for the past 10 yrs.  Last Friday he bought Viagra from the streets. M./s, Alert and ox3. Appears anxious and reports to multiple somatic complaints related to the same. Agitated and irritable. Affect is flat and mood is low and down and depressed. Feels h/h and w/u about life in general. No psychosis  Does admit to suicidal wishes and thoughts and wanting to shoot himself and has a gun at home. I/J guarded. Impulse control is poor./ Imp MDD Recurrent with SI but contracts for safety while he is here. Cocaine abuse /dependence. Alcohol dependence . Viagra abuse. Recommend Transfer and admit pt to [facility] after he is medically cleared and bed is available. May D/ one on one watch as pt contracts for safety while he is here.

## 2014-11-07 DIAGNOSIS — F1590 Other stimulant use, unspecified, uncomplicated: Secondary | ICD-10-CM

## 2014-11-07 DIAGNOSIS — F1020 Alcohol dependence, uncomplicated: Secondary | ICD-10-CM

## 2014-11-07 DIAGNOSIS — F17200 Nicotine dependence, unspecified, uncomplicated: Secondary | ICD-10-CM

## 2014-11-07 DIAGNOSIS — F332 Major depressive disorder, recurrent severe without psychotic features: Principal | ICD-10-CM

## 2014-11-07 LAB — GLUCOSE, CAPILLARY
GLUCOSE-CAPILLARY: 207 mg/dL — AB (ref 65–99)
Glucose-Capillary: 194 mg/dL — ABNORMAL HIGH (ref 65–99)
Glucose-Capillary: 166 mg/dL — ABNORMAL HIGH (ref 65–99)

## 2014-11-07 MED ORDER — ATORVASTATIN CALCIUM 20 MG PO TABS
20.0000 mg | ORAL_TABLET | Freq: Every day | ORAL | Status: DC
  Administered 2014-11-07 – 2014-11-10 (×4): 20 mg via ORAL
  Filled 2014-11-07 (×4): qty 1

## 2014-11-07 MED ORDER — ASPIRIN EC 81 MG PO TBEC
81.0000 mg | DELAYED_RELEASE_TABLET | Freq: Every day | ORAL | Status: DC
  Administered 2014-11-07 – 2014-11-11 (×5): 81 mg via ORAL
  Filled 2014-11-07 (×5): qty 1

## 2014-11-07 MED ORDER — CLOPIDOGREL BISULFATE 75 MG PO TABS
75.0000 mg | ORAL_TABLET | Freq: Every day | ORAL | Status: DC
  Administered 2014-11-07 – 2014-11-11 (×5): 75 mg via ORAL
  Filled 2014-11-07 (×5): qty 1

## 2014-11-07 MED ORDER — HYDROXYZINE HCL 50 MG PO TABS
50.0000 mg | ORAL_TABLET | Freq: Three times a day (TID) | ORAL | Status: DC | PRN
  Administered 2014-11-07 – 2014-11-08 (×2): 50 mg via ORAL
  Filled 2014-11-07 (×2): qty 1

## 2014-11-07 MED ORDER — LISINOPRIL 20 MG PO TABS
40.0000 mg | ORAL_TABLET | Freq: Every day | ORAL | Status: DC
  Administered 2014-11-07 – 2014-11-11 (×5): 40 mg via ORAL
  Filled 2014-11-07 (×5): qty 2

## 2014-11-07 MED ORDER — NICOTINE 21 MG/24HR TD PT24
21.0000 mg | MEDICATED_PATCH | Freq: Every day | TRANSDERMAL | Status: DC
  Administered 2014-11-09: 21 mg via TRANSDERMAL
  Filled 2014-11-07 (×3): qty 1

## 2014-11-07 MED ORDER — AMLODIPINE BESYLATE 10 MG PO TABS
10.0000 mg | ORAL_TABLET | Freq: Every day | ORAL | Status: DC
  Administered 2014-11-07 – 2014-11-11 (×5): 10 mg via ORAL
  Filled 2014-11-07 (×5): qty 1

## 2014-11-07 MED ORDER — CHLORTHALIDONE 25 MG PO TABS
25.0000 mg | ORAL_TABLET | Freq: Every day | ORAL | Status: DC
  Administered 2014-11-07 – 2014-11-11 (×5): 25 mg via ORAL
  Filled 2014-11-07 (×5): qty 1

## 2014-11-07 MED ORDER — CLONIDINE HCL 0.1 MG PO TABS
0.2000 mg | ORAL_TABLET | Freq: Two times a day (BID) | ORAL | Status: DC
  Administered 2014-11-07 – 2014-11-11 (×8): 0.2 mg via ORAL
  Filled 2014-11-07 (×8): qty 2

## 2014-11-07 MED ORDER — METFORMIN HCL 500 MG PO TABS
500.0000 mg | ORAL_TABLET | Freq: Two times a day (BID) | ORAL | Status: DC
  Administered 2014-11-07 – 2014-11-11 (×8): 500 mg via ORAL
  Filled 2014-11-07 (×8): qty 1

## 2014-11-07 MED ORDER — CLONIDINE HCL 0.1 MG PO TABS: 0.1000 mg | ORAL_TABLET | Freq: Three times a day (TID) | ORAL | Status: DC | PRN

## 2014-11-07 MED ORDER — SERTRALINE HCL 25 MG PO TABS
25.0000 mg | ORAL_TABLET | Freq: Every day | ORAL | Status: DC
  Administered 2014-11-07 – 2014-11-08 (×2): 25 mg via ORAL
  Filled 2014-11-07 (×3): qty 1

## 2014-11-07 MED ORDER — INSULIN ASPART 100 UNIT/ML ~~LOC~~ SOLN
0.0000 [IU] | Freq: Three times a day (TID) | SUBCUTANEOUS | Status: DC
  Administered 2014-11-07: 2 [IU] via SUBCUTANEOUS

## 2014-11-07 MED ORDER — PANTOPRAZOLE SODIUM 40 MG PO TBEC
40.0000 mg | DELAYED_RELEASE_TABLET | Freq: Every day | ORAL | Status: DC
  Administered 2014-11-07 – 2014-11-11 (×5): 40 mg via ORAL
  Filled 2014-11-07 (×5): qty 1

## 2014-11-07 MED ORDER — INSULIN ASPART 100 UNIT/ML ~~LOC~~ SOLN
10.0000 [IU] | Freq: Two times a day (BID) | SUBCUTANEOUS | Status: DC
  Administered 2014-11-07 – 2014-11-11 (×8): 10 [IU] via SUBCUTANEOUS
  Filled 2014-11-07 (×2): qty 10
  Filled 2014-11-07: qty 2
  Filled 2014-11-07 (×4): qty 10
  Filled 2014-11-07: qty 3

## 2014-11-07 MED ORDER — INSULIN ASPART 100 UNIT/ML ~~LOC~~ SOLN
0.0000 [IU] | Freq: Three times a day (TID) | SUBCUTANEOUS | Status: DC
  Administered 2014-11-07: 3 [IU] via SUBCUTANEOUS
  Administered 2014-11-08: 2 [IU] via SUBCUTANEOUS
  Administered 2014-11-08 – 2014-11-10 (×7): 3 [IU] via SUBCUTANEOUS
  Administered 2014-11-10: 2 [IU] via SUBCUTANEOUS
  Administered 2014-11-11 (×2): 3 [IU] via SUBCUTANEOUS
  Filled 2014-11-07: qty 3

## 2014-11-07 NOTE — Tx Team (Signed)
Initial Interdisciplinary Treatment Plan   PATIENT STRESSORS: Health problems Substance abuse   PATIENT STRENGTHS: Capable of independent living General fund of knowledge   PROBLEM LIST: Problem List/Patient Goals Date to be addressed Date deferred Reason deferred Estimated date of resolution  Suicidal thougts 11/06/14   11/13/14  Substance abuse 11/06/14   11/13/14                                             DISCHARGE CRITERIA:  Improved stabilization in mood, thinking, and/or behavior Motivation to continue treatment in a less acute level of care Verbal commitment to aftercare and medication compliance  PRELIMINARY DISCHARGE PLAN: Attend aftercare/continuing care group Attend 12-step recovery group Outpatient therapy Return to previous living arrangement  PATIENT/FAMIILY INVOLVEMENT: This treatment plan has been presented to and reviewed with the patient, [name] Michaell Cowing., and/or family member.  The patient and family have been given the opportunity to ask questions and make suggestions.  [name] [name] 11/07/2014, 5:06 AM

## 2014-11-07 NOTE — [facility] Suicide Risk Assessment (Addendum)
[facility] Admission Suicide Risk Assessment   Nursing information obtained from:    Demographic factors:    Current Mental Status:    Loss Factors:    Historical Factors:    Risk Reduction Factors:    Total Time spent with patient: 1 hour Principal Problem: Major depressive disorder, recurrent severe without psychotic features (HCC) Diagnosis:   Patient Active Problem List   Diagnosis Date Noted  . Major depressive disorder, recurrent severe without psychotic features (HCC) [F33.2] 11/07/2014  . Tobacco use disorder [F17.200] 11/07/2014  . Stimulant use disorder (HCC) crack cocaine [F15.90] 11/07/2014  . Alcohol use disorder, moderate, dependence (HCC) [F10.20] 11/07/2014  . CAD (coronary artery disease) [I25.10] 11/05/2014  . CVA (cerebral infarction) [I63.9] 10/19/2014  . LVH (left ventricular hypertrophy) [I51.7] 05/20/2013  . Obesity (BMI 30-39.9) [E66.9] 04/10/2013  . Aortic stenosis, mild [I35.0] 03/07/2013  . DM (diabetes mellitus), type 2, uncontrolled (HCC) [E11.65] 01/05/2013  . Essential hypertension, malignant [I10] 01/25/2009     Continued Clinical Symptoms:  Alcohol Use Disorder Identification Test Final Score (AUDIT): 3 The "Alcohol Use Disorders Identification Test", Guidelines for Use in Primary Care, Second Edition.  World Science writer [facility]). Score between 0-7:  no or low risk or alcohol related problems. Score between 8-15:  moderate risk of alcohol related problems. Score between 16-19:  high risk of alcohol related problems. Score 20 or above:  warrants further diagnostic evaluation for alcohol dependence and treatment.   CLINICAL FACTORS:   Depression:   Comorbid alcohol abuse/dependence Hopelessness Alcohol/Substance Abuse/Dependencies More than one psychiatric diagnosis Previous Psychiatric Diagnoses and Treatments Medical Diagnoses and Treatments/Surgeries     Psychiatric Specialty Exam: Physical Exam  Constitutional: He is oriented to  person, place, and time. He appears well-developed and well-nourished.  HENT:  Head: Normocephalic and atraumatic.  Eyes: Conjunctivae are normal.  Neck: Normal range of motion.  Respiratory: Effort normal.  Musculoskeletal: Normal range of motion.  Neurological: He is alert and oriented to person, place, and time.  Skin: Skin is warm and dry.  Psychiatric: His behavior is normal. Thought content normal.    ROS    COGNITIVE FEATURES THAT CONTRIBUTE TO RISK:  None    SUICIDE RISK:   Moderate:  Frequent suicidal ideation with limited intensity, and duration, some specificity in terms of plans, no associated intent, good self-control, limited dysphoria/symptomatology, some risk factors present, and identifiable protective factors, including available and accessible social support.  PLAN OF CARE: Admit to behavioral health  Medical Decision Making:  Established Problem, Worsening (2)  I certify that inpatient services furnished can reasonably be expected to improve the patient's condition.   Jimmy Footman 11/07/2014, 11:29 AM

## 2014-11-07 NOTE — [facility] Group Notes (Signed)
[facility] LCSW Group Therapy  11/07/2014 2:32 PM  Type of Therapy:  Group Therapy  Participation Level:  None  Summary of Progress/Problems: Patient attended group but did not have the opportunity to participate as he was called out by staff for Physical Therapy evaluation during introductions.   Lulu Riding, MSW, LCSWA 11/07/2014, 2:32 PM

## 2014-11-07 NOTE — Plan of Care (Signed)
Problem: Alteration in mood & ability to function due to Goal: LTG-Pt reports reduction in suicidal thoughts (Patient reports reduction in suicidal thoughts and is able to verbalize a safety plan for whenever patient is feeling suicidal) Outcome: Progressing Patient reports feeling better.    Goal: LTG-Patient demonstrates decreased signs of withdrawal (Patient demonstrates decreased signs of withdrawal to the point the patient is safe to return home and continue treatment in an outpatient setting) Outcome: Progressing Minimally interactive, but present in the milieu.  Goal: LTG-Pt verbalizes understanding of importance of med regimen (Patient verbalizes understanding of importance of medication regimen and need to continue outpatient care and support groups) Outcome: Not Progressing Patient does not exhibit a full understanding of the importance of medication compliance.

## 2014-11-07 NOTE — Progress Notes (Signed)
D: Patient visible in milieu today. Affect is blunted but has good eye contact and brightens upon approach. attened  groups this shift. Patient denies any SI of HI at this time. Patient states that depression has gotten better Appetite is good. Patient in no distress at this time. Patient remains compliant with medications  A: medications given as prescribed. Support and encouragement given. Encouraged patient to attend groups.  R: patient receptive of care.

## 2014-11-07 NOTE — H&P (Addendum)
Psychiatric Admission Assessment Adult  Patient Identification: [name] [name]. MRN:  [id number] Date of Evaluation:  11/07/2014 Chief Complaint: Suicidality  Principal Diagnosis: Major depressive disorder, recurrent severe without psychotic features ([place]) Diagnosis:   Patient Active Problem List   Diagnosis Date Noted  . Major depressive disorder, recurrent severe without psychotic features ([place]) [F33.2] 11/07/2014  . Tobacco use disorder [F17.200] 11/07/2014  . Stimulant use disorder ([name] [name]) crack cocaine [F15.90] 11/07/2014  . Alcohol use disorder, moderate, dependence ([name] [name]) [F10.20] 11/07/2014  . CAD (coronary artery disease) [I25.10] 11/05/2014  . CVA (cerebral infarction) [I63.9] 10/19/2014  . LVH (left ventricular hypertrophy) [I51.7] 05/20/2013  . Obesity (BMI 30-39.9) [E66.9] 04/10/2013  . Aortic stenosis, mild [I35.0] 03/07/2013  . DM (diabetes mellitus), type 2, uncontrolled ([place]) [E11.65] 01/05/2013  . Essential hypertension, malignant [I10] 01/25/2009   History of Present Illness:  [name] [name] is a single 54 year old African-American male from [place]. He brought himself to the hospital on Friday, September 30 due to chest pain. The patient is states that he has been using cocaine which he was mixing with Viagra (which is not prescribed to him and gets on the streets). The patient states that he kept using despite having chest pain because he just didn't care to die anymore.  Patient states that he has been having depressed mood and crying spells for about a year. The symptoms started after he had a stroke. He also reports poor appetite, energy and concentration. The patient frequently has thoughts of wanting to die as he feels he cannot do anything for himself anymore. He complains of feeling hopeless and helpless. He reports that since his stroke he has very difficult time with memory, organization and concentration. He frequently forgets things or leaves things  on the stove on. His gait is unsteady and he has been using a cane, he reports having multiple falls at home even with loss of consciousness. Patient has a home health aid that comes and helps him with his pillbox but does still only assistance he is receiving. He is states that [facility] has scheduled him for an assessment for community support services. Currently he is not seeing a psychiatrist or receiving any treatment for substance abuse.  Patient has been in our hospital with different physical complaints 6 times since March.  Patient was initially admitted to the medical floor after his presentation with chest pain. When he was told he was going to be discharged 24 hours later the patient started voicing suicidality.  Substance abuse history: Patient smokes about one pack of cigarettes per day. He has been using crack for about 6 years, states he uses about once every 3 months. He has been drinking alcohol for 20 years he binges every 2 weeks. During his being in episodes he can drink up to a fifth of alcohol +1 or 2 40 ounces beers. Patient states that he was hospitalized in our facility for substance abuse back in the 90s No other history of substance abuse treatment.  Symptoms: Depression: Anhedonia, poor appetite poor concentration, hopelessness helplessness suicidality, depressed mood. Psychosis: Denies auditory or visual hallucinations Bipolar disorder: Denies symptoms consistent with mania or hypomania  Total Time spent with patient: 1 hour  Past Psychiatric History: Patient reports one prior suicidal attempt in the night is when he drank rat poison. He reports having 2 prior hospitalizations in our facility back in the 90s. He believes he was diagnosed with depression and schizophrenia. He denies receiving any prior treatment for substance abuse.  Denies any history of self-injurious behaviors.  Risk to Self: Is patient at risk for suicide?: Yes Risk to Others:   no Prior Inpatient Therapy:    yes Prior Outpatient Therapy:   no  Alcohol Screening: Patient refused Alcohol Screening Tool:  (no) 1. How often do you have a drink containing alcohol?: 2 to 4 times a month 2. How many drinks containing alcohol do you have on a typical day when you are drinking?: 3 or 4 3. How often do you have six or more drinks on one occasion?: Never Preliminary Score: 1 4. How often during the last year have you found that you were not able to stop drinking once you had started?: Never 5. How often during the last year have you failed to do what was normally expected from you becasue of drinking?: Never 6. How often during the last year have you needed a first drink in the morning to get yourself going after a heavy drinking session?: Never 7. How often during the last year have you had a feeling of guilt of remorse after drinking?: Never 8. How often during the last year have you been unable to remember what happened the night before because you had been drinking?: Never 9. Have you or someone else been injured as a result of your drinking?: No 10. Has a relative or friend or a doctor or another health worker been concerned about your drinking or suggested you cut down?: No Alcohol Use Disorder Identification Test Final Score (AUDIT): 3 Brief Intervention: AUDIT score less than 7 or less-screening does not suggest unhealthy drinking-brief intervention not indicated Substance Abuse History in the last 12 months:  Yes.     Past Medical History: Patient suffers from hypertension, diabetes. He had an cerebrovascular accident about a year ago.  He follows up with [facility]. He denies any history of surgeries. States that he has been compliant with his medications prior to admission.  Denies any history of seizures. He does report a history of head trauma at home as he has fallen frequently and at times have lost consciousness.  Seen by cardiology while on the medical floor: 1) chest  pain Atypical in nature, likely secondary to cocaine Cardiac enzymes negative, EKG unchanged from prior studies Cardiac catheterization last year given his history of chronic chest pain with minimal coronary artery disease of a ramus branch.  2) malignant hypertension Patient compliance in question Blood pressure has improved on outpatient medication regimen He does report that he has his medications at home Would continue current regimen, monitor --If blood pressure continues to run high, could add hydralazine  3) diabetes type 2, poorly controlled Needs outpatient referral to endocrine or primary care Suspect that patient compliance is an issue  5) history of stroke Likely secondary to chronic drug abuse  Unable to exclude PFO Would continue low-dose aspirin  6) LVH Previously was noted to be significant, Likely secondary to chronic hypertension.  Past Medical History  Diagnosis Date  . Hypertension   . Diabetes mellitus   . GERD (gastroesophageal reflux disease)   . Pericarditis   . Polysubstance abuse     cocaine, marijuana  . Auditory hallucinations   . Depression   . Hx of medication noncompliance   . History of suicide attempt   . Stroke [facility]) February 2015    Right MCA aneurysm; head MRI - acute infarct of the right frontal lobe. Smaller acute infarct of the left frontal lobe. Thought to be  due to cerebral emboli. Left PICA infarct. Small chronic lacunar infarct right internal capsule. Chronic microvascular ischemic change with white matter change.  . Coronary artery disease     No reported catheterization. Negative Myoview stress test and 2013.  Marland Kitchen Syncope and collapse   . Bipolar disorder ([name])   . Obesity, Class II, BMI 35-39.9, with comorbidity ([place])   . Hypertensive hypertrophic cardiomyopathy [facility]) February 2015    Severe concentric LVH on echocardiogram.  . Aortic stenosis, mild February 2015    Past Surgical History  Procedure Laterality Date  .  Pericardial fluid drainage    . Transthoracic echocardiogram  03/23/2013    EF 5560%. Elevated left atrial pressure. Severe concentric LVH. Mild aortic stenosis. -- Appearance of hypertensive hypertrophic heart disease.  No clear cardioembolic source  . Nm myoview ltd  July 2013    No ischemia or infarct. There is inferior wall defec suggestive of diaphragmatic attenuation. Mild LV dilation at to be due to hypertensive heart disease. EF 55%. Poor exercise tolerance.  . Carotid dopplers:  03/23/2013    Interval thickening of bilateral common carotid no significant plaque formation. Tortuosity but no significant stenosis. Patent bilateral vertebral  arteries   Family History:  Family History  Problem Relation Age of Onset  . Stroke Mother   . Diabetes type II Mother   . Hypertension Mother   . Hypertension Father   . Arthritis Father   . Arthritis Sister   . Diabetes type II Sister   . Hypertension Sister    Family Psychiatric  History: Patient reports that his father was an alcoholic. There is no history of suicides in his family. He thinks one of his cousins suffers from bipolar or schizophrenia  Social History: Patient is single, never married and has 3 children ages 56, 27 and 85 year old but he is not close to any of them. The patient has been receiving SSI for the last 8 years. He also has Medicaid. In the past he used to work as a Aeronautical engineer. He was placed on SSI due to his mental illness and medical problems. He has a 10th grade education. As far as his legal history he reports that he had raped charges in the 37s. He used to be a registered sex offender but states that he was then removed from the list as this was allowed back then. Patient also had prior charges for DWIs. History  Alcohol Use  . 25.8 oz/week  . 3 Glasses of wine, 40 Cans of beer per week    Comment: occasionally; reports 1 pint of vodka per month at paydaysocially     History  Drug Use  . 1.00 per week  .  Special: Marijuana, Cocaine    Comment: past    Social History   Social History  . Marital Status: Single    Spouse Name: N/A  . Number of Children: N/A  . Years of Education: N/A   Social History Main Topics  . Smoking status: Current Every Day Smoker -- 0.50 packs/day for 15 years    Types: Cigars  . Smokeless tobacco: None  . Alcohol Use: 25.8 oz/week    3 Glasses of wine, 40 Cans of beer per week     Comment: occasionally; reports 1 pint of vodka per month at paydaysocially  . Drug Use: 1.00 per week    Special: Marijuana, Cocaine     Comment: past  . Sexual Activity: Yes   Other Topics Concern  .  None   Social History Narrative   Single with no children.  No exercise.   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   Has a history of polysubstance abuse including cocaine. No she she urine toxicology screen in the hospital was positive.  He also current smoker.   He is seen by behavioral health for bipolar disorder.    Allergies:  No Known Allergies   Lab Results:  Results for orders placed or performed during the hospital encounter of 11/05/14 (from the past 48 hour(s))  CBC     Status: Abnormal   Collection Time: 11/05/14  1:17 PM  Result Value Ref Range   WBC 14.3 (H) 3.8 - 10.6 K/uL   RBC 5.00 4.40 - 5.90 MIL/uL   Hemoglobin 15.7 13.0 - 18.0 g/dL   HCT 45.5 40.0 - 52.0 %   MCV 91.0 80.0 - 100.0 fL   MCH 31.5 26.0 - 34.0 pg   MCHC 34.6 32.0 - 36.0 g/dL   RDW 12.6 11.5 - 14.5 %   Platelets 203 150 - 440 K/uL  Basic metabolic panel     Status: Abnormal   Collection Time: 11/05/14  1:17 PM  Result Value Ref Range   Sodium 136 135 - 145 mmol/L   Potassium 3.6 3.5 - 5.1 mmol/L   Chloride 103 101 - 111 mmol/L   CO2 26 22 - 32 mmol/L   Glucose, Bld 152 (H) 65 - 99 mg/dL   BUN 18 6 - 20 mg/dL   Creatinine, Ser 1.21 0.61 - 1.24 mg/dL   Calcium 8.8 (L) 8.9 - 10.3 mg/dL   GFR calc non Af Amer >60 >60 mL/min   GFR calc Af  Amer >60 >60 mL/min    Comment: (NOTE) The eGFR has been calculated using the CKD EPI equation. This calculation has not been validated in all clinical situations. eGFR's persistently <60 mL/min signify possible Chronic Kidney Disease.    Anion gap 7 5 - 15  Glucose, capillary     Status: Abnormal   Collection Time: 11/05/14  4:06 PM  Result Value Ref Range   Glucose-Capillary 154 (H) 65 - 99 mg/dL  Glucose, capillary     Status: Abnormal   Collection Time: 11/05/14  7:37 PM  Result Value Ref Range   Glucose-Capillary 149 (H) 65 - 99 mg/dL  Heparin level (unfractionated)     Status: Abnormal   Collection Time: 11/05/14  9:35 PM  Result Value Ref Range   Heparin Unfractionated <0.10 (L) 0.30 - 0.70 IU/mL    Comment:        IF HEPARIN RESULTS ARE BELOW EXPECTED VALUES, AND PATIENT DOSAGE HAS BEEN CONFIRMED, SUGGEST FOLLOW UP TESTING OF ANTITHROMBIN III LEVELS.   CBC     Status: None   Collection Time: 11/06/14  5:49 AM  Result Value Ref Range   WBC 8.8 3.8 - 10.6 K/uL   RBC 4.49 4.40 - 5.90 MIL/uL   Hemoglobin 14.4 13.0 - 18.0 g/dL   HCT 40.9 40.0 - 52.0 %   MCV 91.1 80.0 - 100.0 fL   MCH 32.0 26.0 - 34.0 pg   MCHC 35.2 32.0 - 36.0 g/dL   RDW 12.8 11.5 - 14.5 %   Platelets 186 150 - 440 K/uL  Glucose, capillary     Status: Abnormal   Collection Time: 11/06/14  9:27 AM  Result Value Ref Range   Glucose-Capillary 222 (H) 65 - 99 mg/dL  Glucose,  capillary     Status: Abnormal   Collection Time: 11/06/14 12:22 PM  Result Value Ref Range   Glucose-Capillary 256 (H) 65 - 99 mg/dL  Glucose, capillary     Status: Abnormal   Collection Time: 11/06/14  4:24 PM  Result Value Ref Range   Glucose-Capillary 202 (H) 65 - 99 mg/dL    Metabolic Disorder Labs:  Lab Results  Component Value Date   HGBA1C 10.1* 03/23/2013   MPG 232* 01/05/2013   MPG 263* 01/09/2012   No results found for: PROLACTIN Lab Results  Component Value Date   CHOL 124 10/21/2014   TRIG 149 10/21/2014    HDL 22* 10/21/2014   CHOLHDL 5.6 10/21/2014   VLDL 30 10/21/2014   LDLCALC 72 10/21/2014   LDLCALC 74 05/13/2013    Current Medications: Current Facility-Administered Medications  Medication Dose Route Frequency Provider Last Rate Last Dose  . acetaminophen (TYLENOL) tablet 650 mg  650 mg Oral Q6H PRN Dewain Penning, MD      . alum & mag hydroxide-simeth (MAALOX/MYLANTA) 200-200-20 MG/5ML suspension 30 mL  30 mL Oral Q4H PRN Dewain Penning, MD      . amLODipine (NORVASC) tablet 10 mg  10 mg Oral Daily Hildred Priest, MD      . aspirin EC tablet 81 mg  81 mg Oral Daily Hildred Priest, MD      . atorvastatin (LIPITOR) tablet 20 mg  20 mg Oral q1800 Hildred Priest, MD      . chlorthalidone (HYGROTON) tablet 25 mg  25 mg Oral Daily Hildred Priest, MD      . cloNIDine (CATAPRES) tablet 0.1 mg  0.1 mg Oral TID PRN Hildred Priest, MD      . clopidogrel (PLAVIX) tablet 75 mg  75 mg Oral Daily Hildred Priest, MD      . insulin aspart (novoLOG) injection 0-9 Units  0-9 Units Subcutaneous TID WC Hildred Priest, MD      . insulin aspart (novoLOG) injection 10 Units  10 Units Subcutaneous BID WC Hildred Priest, MD      . lisinopril (PRINIVIL,ZESTRIL) tablet 40 mg  40 mg Oral Daily Hildred Priest, MD      . magnesium hydroxide (MILK OF MAGNESIA) suspension 30 mL  30 mL Oral Daily PRN Dewain Penning, MD      . metFORMIN (GLUCOPHAGE) tablet 500 mg  500 mg Oral BID Hildred Priest, MD      . nicotine (NICODERM CQ - dosed in mg/24 hours) patch 21 mg  21 mg Transdermal Daily Hildred Priest, MD      . pantoprazole (PROTONIX) EC tablet 40 mg  40 mg Oral Daily Hildred Priest, MD      . sertraline (ZOLOFT) tablet 25 mg  25 mg Oral Daily Hildred Priest, MD      . traZODone (DESYREL) tablet 150 mg  150 mg Oral QHS Dewain Penning, MD   150 mg at 11/06/14 2337   PTA  Medications: Prescriptions prior to admission  Medication Sig Dispense Refill Last Dose  . amLODipine (NORVASC) 10 MG tablet Take 10 mg by mouth daily.   10/18/2014 at Unknown time  . aspirin EC 81 MG EC tablet Take 1 tablet (81 mg total) by mouth daily. 120 tablet 0 10/18/2014 at Unknown time  . atorvastatin (LIPITOR) 20 MG tablet Take 1 tablet (20 mg total) by mouth daily at 6 PM. 30 tablet 0   . buPROPion (WELLBUTRIN XL) 150 MG 24 hr tablet Take  150 mg by mouth daily.   10/18/2014 at Unknown time  . busPIRone (BUSPAR) 10 MG tablet Take 10 mg by mouth 2 (two) times daily.   10/18/2014 at Unknown time  . chlorthalidone (HYGROTON) 25 MG tablet Take 1 tablet (25 mg total) by mouth daily. 30 tablet 0 10/18/2014 at Unknown time  . cloNIDine (CATAPRES) 0.2 MG tablet Take 0.2 mg by mouth 2 (two) times daily.    10/18/2014 at Unknown time  . clopidogrel (PLAVIX) 75 MG tablet Take 1 tablet (75 mg total) by mouth daily. 30 tablet 0 10/18/2014 at Unknown time  . diclofenac sodium (VOLTAREN) 1 % GEL Apply 2 g topically 4 (four) times daily.   Unknown at Unknown time  . insulin aspart (NOVOLOG) 100 UNIT/ML injection Inject 10 Units into the skin 2 (two) times daily with a meal.   10/18/2014 at Unknown time  . lisinopril (PRINIVIL,ZESTRIL) 40 MG tablet Take 40 mg by mouth daily.   10/18/2014 at Unknown time  . metFORMIN (GLUCOPHAGE) 500 MG tablet Take 500 mg by mouth 2 (two) times daily.   10/18/2014 at Unknown time  . pantoprazole (PROTONIX) 40 MG tablet Take 40 mg by mouth daily.   10/18/2014 at Unknown time  . traZODone (DESYREL) 100 MG tablet Take 1 tablet (100 mg total) by mouth at bedtime. 30 tablet 0     Musculoskeletal: Strength & Muscle Tone: within normal limits Gait & Station: normal Patient leans: N/A  Psychiatric Specialty Exam: Physical Exam  Constitutional: He is oriented to person, place, and time. He appears well-developed and well-nourished.  HENT:  Head: Normocephalic and atraumatic.  Eyes:  Conjunctivae and EOM are normal.  Neck: Normal range of motion.  Respiratory: Effort normal.  Musculoskeletal: Normal range of motion.  Neurological: He is alert and oriented to person, place, and time.  Skin: Skin is warm and dry.  Psychiatric: His behavior is normal. Thought content normal.    Review of Systems  Constitutional: Negative.   HENT: Negative.   Eyes: Negative.   Respiratory: Negative.   Cardiovascular: Negative.   Gastrointestinal: Negative.   Genitourinary: Negative.   Musculoskeletal: Positive for falls. Negative for myalgias, back pain, joint pain and neck pain.  Skin: Negative.   Neurological: Negative.   Psychiatric/Behavioral: Positive for depression, memory loss and substance abuse. Negative for suicidal ideas and hallucinations. The patient has insomnia. The patient is not nervous/anxious.        Passive suicidal thoughts    Blood pressure 147/98, pulse 87, temperature 97.9 F (36.6 C), temperature source Oral, resp. rate 18, height  (1.803 m), weight 109.317 kg (241 lb), SpO2 99 %.Body mass index is 33.63 kg/(m^2).  General Appearance: Fairly Groomed  Patent attorney::  Good  Speech:  Slurred  Volume:  Normal  Mood:  Euthymic  Affect:  Congruent  Thought Process:  Linear  Orientation:  Full (Time, Place, and Person)  Thought Content:  Hallucinations: None  Suicidal Thoughts:  Passive suicidal ideation  Homicidal Thoughts:  No  Memory:  Immediate;   Fair Recent;   Fair Remote;   Good  Judgement:  Fair  Insight:  Shallow  Psychomotor Activity:  Normal  Concentration:  NA  Recall:  NA  Fund of Knowledge:Fair  Language: Fair  Akathisia:  No  Handed:    AIMS (if indicated):     Assets:  Communication Skills Desire for Improvement Financial Resources/Insurance Housing  ADL's:  Intact  Cognition: WNL  Sleep:  Number of Hours: 7.45  Treatment Plan Summary: Daily contact with patient to assess and evaluate symptoms and progress in  treatment and Medication management   54 year old single African-American male with a multitude of chronic medical conditions (diabetes, hypertension, cerebrovascular disease) along with history of substance abuse and depression. Patient presents to our emergency department with uncontrollable hypertension and chest pain in the setting of increasing use of Viagra and crack.   Major depressive disorder: Patient reports history of prior psychiatric treatment in the 90s. He reports worsening of mood and passive suicidal thoughts since he had a stroke and year ago. Patient was prescribed with Wellbutrin and BuSpar. I will discontinue this treatment and instead start the patient on sertraline 25 mg a day.  Insomnia: Continue the patient on trazodone 150 mg by mouth daily at bedtime  Alcohol use disorder-cocaine use disorder: Patient will be referred for outpatient substance abuse treatment once discharged.  Diabetes: Patient will be restarted on home regimen of NovoLog 10 units twice a day with meals.  He will be restarted on a low carbohydrate diet. He will be started on supplemental insulin. Also continue metformin 500 mg by mouth twice a day  Hypertension: Continue chlorthalidone 25 mg a day, Norvasc 10 mg a day, lisinopril 40 mg a day and clonidine. Vital signs will be check every 8 hours. Patient will receive a low sodium diet  Hyperlipidemia: Patient will be continued on Lipitor 20 mg daily  Cardiovascular disease and cerebrovascular disease: Continue aspirin 81 mg and Plavix 75 mg a day  GERD: Continue the patient on Protonix 40 mg a day  Tobacco use disorder: Patient will be started on a nicotine patch 21 mg a day  Unsteady gait: Will consult physical therapy and ordered a rolling walker.  Discharge disposition: Patient is requesting assistance with possible assisted living facility. Will discuss this with Education officer, museum.  Patient has been in our hospital 6 times over the last 7 months.   Clearly having issues with compliance as hemoglobin A1c is about 10 and blood pressure is poorly controlled.  I certify that inpatient services furnished can reasonably be expected to improve the patient's condition.   Hildred Priest 10/3/201611:42 AM

## 2014-11-07 NOTE — [facility] Group Notes (Signed)
[facility] LCSW Aftercare Discharge Planning Group Note   11/07/2014 3:35 PM  Participation Quality: Good  Mood/Affect:  Depressed and Flat  Depression Rating:    Anxiety Rating:    Thoughts of Suicide:  No Will you contract for safety?   Yes  Current AVH:  No  Plan for Discharge/Comments:    Transportation Means:   Supports:  Glennon Mac

## 2014-11-07 NOTE — Evaluation (Signed)
Physical Therapy Evaluation Patient Details Name: [name] [name]. MRN: [id number] DOB: [birth year] Today's Date: 11/07/2014   History of Present Illness  Pt is a 54 y.o. male with a known history of hypertension, diabetes mellitus type 2, CVA, TIA- 9 /2016, history of polysubstance abuse, coronary artery disease, bipolar disorder, GERD presents to the emergency room with the complaints of acute onset of chest pain. Patient states he used 1 tablet of Viagra and also smoked cocaine around 5 PM yesterday and after that he developed retrosternal chest pain at night. Associated shortness of breath and diaphoresis   Clinical Impression  Pt walked into session from group therapy without the use of an assistive device with mild gait impairments noted as he tended to lean to the L/stagger slightly when walking. Pt has some L sided weakness from a stroke that had occurred back in 2015. Pt states that he frequently gets lightheaded and SOB due to his BP medications. Symptoms were monitored throughout session and pt needed to take rest breaks after short periods of activity. Pt demonstrated near symmetrical strength with MMT. Pt states that he typically uses a SPC for ambulation and that he feels more steady when using one. Reports last fall happened last Tuesday. He states the he is able to get himself up off the floor without assistance when he does fall. Due to pt's seemingly higher level of function and independence, the 5xSTS and DGI were performed. Pt scored a 14/21 (omitted stairs task) on DGI and 32.6 seconds on the 5XSTS test; both scores signal that pt has decreased safety with functional mobility and is at an increased risk of falls. Pt would continue to benefit from acute skilled PT services in order to assist in improving his dynamic balance and capacity for functional mobility. Current d/c recommendations are for HHPT pending any significant improvements in functional mobility safety.     Follow Up  Recommendations Home health PT    Equipment Recommendations       Recommendations for Other Services       Precautions / Restrictions Precautions Precautions: Fall Restrictions Weight Bearing Restrictions: No      Mobility  Bed Mobility                  Transfers Overall transfer level: Independent               General transfer comment: independent with sit <>stand transfer w/o assistive device. bilat genu valgus displayed signaling hip abductor weakness and unusually wide BOS noted with standing.   Ambulation/Gait Ambulation/Gait assistance: Supervision Ambulation Distance (Feet): 400 Feet Assistive device:  (pt switched between walking in middle of hallway w/o assistance to using the railing on the side of the hallway for support. Pt typically used a SPC for ambulation. MIldly unsteady when not using wall, especially with turns.) Gait Pattern/deviations: Leaning posteriorly;Staggering left;Staggering right   Gait velocity interpretation: Below normal speed for age/gender General Gait Details: Pt ablet to walk without asstive device; tends to lean and stagger with his gait. Typically uses a SPC for support due to balance issues post-stroke back in 2015.  Stairs            Wheelchair Mobility    Modified Rankin (Stroke Patients Only)       Balance Overall balance assessment: Needs assistance Sitting-balance support: Feet supported;No upper extremity supported Sitting balance-Leahy Scale: Normal     Standing balance support: No upper extremity supported;During functional activity Standing balance-Leahy Scale: Fair Standing balance comment:  Pt unsteady; mild fall risk without using an assistive device.              High level balance activites: Direction changes;Turns;Head turns;Sudden stops High Level Balance Comments: Pt unsteady with turns Standardized Balance Assessment Standardized Balance Assessment :  (5XSTS: 33 seconds)   Dynamic  Gait Index Level Surface: Mild Impairment Change in Gait Speed: Mild Impairment Gait with Horizontal Head Turns: Mild Impairment Gait with Vertical Head Turns: Mild Impairment Gait and Pivot Turn: Mild Impairment Step Over Obstacle: Mild Impairment Step Around Obstacles: Mild Impairment Steps: Severe Impairment (not attempted) Total Score: 14       Pertinent Vitals/Pain Pain Assessment:  (pt reports pain in his back near L shoulder blade; started 3 hours ago)    Home Living Family/patient expects to be discharged to:: Private residence Living Arrangements: Alone Available Help at Discharge:  (pt has no help) Type of Home: Apartment Home Access: Level entry     Home Layout: One level Home Equipment: Cane - single point Additional Comments: Pt states that doing ADLs in his apartment makes him dizzy; specifically when bending down to the floor and coming back up.    Prior Function Level of Independence: Independent with assistive device(s)         Comments: Pt uses single point cane since CVA 8 months ago.      Hand Dominance        Extremity/Trunk Assessment   Upper Extremity Assessment: Overall WFL for tasks assessed           Lower Extremity Assessment: Overall WFL for tasks assessed         Communication   Communication: Expressive difficulties  Cognition Arousal/Alertness: Awake/alert Behavior During Therapy: WFL for tasks assessed/performed Overall Cognitive Status: Within Functional Limits for tasks assessed                      General Comments      Exercises        Assessment/Plan    PT Assessment Patient needs continued PT services  PT Diagnosis Abnormality of gait   PT Problem List Decreased balance;Decreased mobility;Decreased activity tolerance;Decreased coordination  PT Treatment Interventions Gait training;Stair training;Therapeutic activities;Therapeutic exercise;Functional mobility training;Balance training   PT Goals  (Current goals can be found in the Care Plan section) Acute Rehab PT Goals Patient Stated Goal: to feel more steady with walking PT Goal Formulation: With patient Time For Goal Achievement: 11/21/14 Potential to Achieve Goals: Good    Frequency Min 2X/week   Barriers to discharge        Co-evaluation               End of Session Equipment Utilized During Treatment: Gait belt Activity Tolerance: Patient tolerated treatment well Patient left:  (with nurse/sitter to take back to group therapy)           Time: 0981-1914 PT Time Calculation (min) (ACUTE ONLY): 16 min   Charges:         PT G Codes:        Clarice Bonaventure,SPT 11/07/2014, 2:19 PM

## 2014-11-07 NOTE — Progress Notes (Signed)
Inpatient Diabetes Program Recommendations  AACE/ADA: New Consensus Statement on Inpatient Glycemic Control (2015)  Target Ranges:  Prepandial:   less than 140 mg/dL      Peak postprandial:   less than 180 mg/dL (1-2 hours)      Critically ill patients:  140 - 180 mg/dL   Review of Glycemic Control  Results for [name] [name], [name] [name] (MRN [id number]) as of 11/07/2014 10:11  Ref. Range 10/21/2014 07:12 10/21/2014 11:17 11/05/2014 11:38 11/05/2014 16:06 11/05/2014 19:37 11/06/2014 09:27 11/06/2014 12:22 11/06/2014 16:24  Glucose-Capillary Latest Ref Range: 65-99 mg/dL 829 (H) 562 (H) 76 130 (H) 149 (H) 222 (H) 256 (H) 202 (H)    Diabetes history: Type 2 Outpatient Diabetes medications: Novolog 10 units bid, Metformin  bid Current orders for Inpatient glycemic control: none currently  Inpatient Diabetes Program Recommendations: Please consider ordering an A1C to determine blood sugar control over the past 2-3 months                                                                               Consider re-ordering Novolog moderate correction insulin 0-15 units tid with meals             Consider ordering Metformin  bid  Susette Racer, RN, [place], [place], CDE Diabetes Coordinator Inpatient Diabetes Program  [phone] (Team Pager) [phone] [facility] Office) 11/07/2014 10:20 AM

## 2014-11-07 NOTE — Progress Notes (Signed)
Patient was pleasant and cooperative. No needs or complaints noted. Only request was for Trazodone. Dr. Guss Bunde was contacted and gave telephone order for  of Trazodone QHS. Medication was administered without incident. Patient did not choose to discuss feelings on this evening stating he needs to rest. Dr. Guss Bunde noted she would put in the remaining medication orders for the patient on Monday.

## 2014-11-08 LAB — GLUCOSE, CAPILLARY
GLUCOSE-CAPILLARY: 191 mg/dL — AB (ref 65–99)
GLUCOSE-CAPILLARY: 168 mg/dL — AB (ref 65–99)
GLUCOSE-CAPILLARY: 181 mg/dL — AB (ref 65–99)
Glucose-Capillary: 185 mg/dL — ABNORMAL HIGH (ref 65–99)
Glucose-Capillary: 134 mg/dL — ABNORMAL HIGH (ref 65–99)
Glucose-Capillary: 115 mg/dL — ABNORMAL HIGH (ref 65–99)

## 2014-11-08 MED ORDER — TUBERCULIN PPD 5 UNIT/0.1ML ID SOLN: 5.0000 [IU] | Freq: Once | INTRADERMAL | Status: DC

## 2014-11-08 NOTE — [facility] Group Notes (Signed)
[facility] Group Notes:  (Nursing/MHT/Case Management/Adjunct)  Date:  11/08/2014  Time:  5:09 PM  Type of Therapy:  Psychoeducational Skills  Participation Level:  Did Not Attend   Lynelle Smoke [facility] 11/08/2014, 5:09 PM

## 2014-11-08 NOTE — Plan of Care (Signed)
Problem: Alteration in mood & ability to function due to Goal: LTG-Pt reports reduction in suicidal thoughts (Patient reports reduction in suicidal thoughts and is able to verbalize a safety plan for whenever patient is feeling suicidal)  Outcome: Progressing Denies SI at this time

## 2014-11-08 NOTE — Progress Notes (Signed)
D: Pt denies SI/HI/AVH. Pt is pleasant and cooperative. Pt stated he was feeling about the same, but pt was visible on the milieu interacting with peers .   A: Pt was offered support and encouragement. Pt was given scheduled medications. Pt was encourage to attend groups. Q 15 minute checks were done for safety.   R:Pt attends groups and interacts well with peers and staff. Pt is taking medication. Pt has no complaints.Pt receptive to treatment and safety maintained on unit.

## 2014-11-08 NOTE — Tx Team (Signed)
Interdisciplinary Treatment Plan Update (Adult)  Date:  11/08/2014 Time Reviewed:  3:16 PM  Progress in Treatment: Attending groups: No. Participating in groups:  No. Taking medication as prescribed:  Yes. Tolerating medication:  Yes. Family/Significant othe contact made:  No, will contact:  if patient provides consent Patient understands diagnosis:  Yes. Discussing patient identified problems/goals with staff:  Yes. Medical problems stabilized or resolved:  Yes. Denies suicidal/homicidal ideation: Yes. Issues/concerns per patient self-inventory:  Yes. patient reports his is homeless and wants ALF placement Other:  New problem(s) identified: No, Describe:  none reported  Discharge Plan or Barriers: Patient will stabilize on meds for depression and SI. Patient states he is homeless and cannot return to his [address] housing. Patient is followed by [facility] [place] is requested to assist with ALF placement. Patient will need home health PT.  Reason for Continuation of Hospitalization: Depression Suicidal ideation  Comments:  Estimated length of stay: up to 3 days expected discharge Friday 11/11/14  New goal(s):  Review of initial/current patient goals per problem list:   1.  Goal(s): participate in care planning  Met:  Yes  Target date: at discharge  2.  Goal (s): placement in ALF  Met:  No  Target date: at discharge  3. Goal: decrease depression and eliminate SI  Patient no longer reports SI  Attendees: Physician:  Merlyn Albert, MD 10/4/20163:16 PM  Nursing:   Carolynn Sayers, RN 10/4/20163:16 PM  Other:  Carmell Austria, LCSWA  10/4/20163:16 PM  Other:   10/4/20163:16 PM  Other:   10/4/20163:16 PM  Other:  10/4/20163:16 PM  Other:  10/4/20163:16 PM  Other:  10/4/20163:16 PM  Other:  10/4/20163:16 PM  Other:  10/4/20163:16 PM  Other:  10/4/20163:16 PM  Other:   10/4/20163:16 PM   Scribe for Treatment Team:   Keene Breath, MSW, [place] 11/08/2014,  3:16 PM

## 2014-11-08 NOTE — Progress Notes (Signed)
PT Cancellation Note  Patient Details Name: [name] [name]. MRN: [id number] DOB: 06-22-1960   Cancelled Treatment:    Reason Eval/Treat Not Completed: Patient declined, no reason specified. Chart reviewed and RN consulted. Attempted to work with patient but he refuses PT at this time. Encouragement provided however pt continues to refuse. Will attempt to work with patient on later date as he is willing.  Sharalyn Ink Huprich PT, DPT   Huprich,Jason 11/08/2014, 4:42 PM

## 2014-11-08 NOTE — Progress Notes (Signed)
D: Patient refused to work with PT today. Has attended some groups. Appetite good. Denies SI/HI/AVH. A: Taking meds; on q 15 minute checks; offered emotional support. R: Cooperative.

## 2014-11-08 NOTE — Progress Notes (Addendum)
[facility] MD Progress Note  11/08/2014 11:06 AM [name] [name].  MRN:  [id number] Subjective: Patient was seen this morning. He appeared calm eating in the day room with peers. When interviewed he reported having a very difficult evening as he could not go to sleep. He worries about returning home where he feels he is unable to care for himself. The patient is requesting a assisted living facility. When I explained to him that we are planning on assisting with that but plan to discharge him home and follow up with a social worker from home health he became very upset and irritated and stated that he was tired of living.  Patient got up and left.  Per nursing: D: Pt denies SI/HI/AVH. Pt is pleasant and cooperative. Pt stated he was feeling about the same, but pt was visible on the milieu interacting with peers .   A: Pt was offered support and encouragement. Pt was given scheduled medications. Pt was encourage to attend groups. Q 15 minute checks were done for safety.   R:Pt attends groups and interacts well with peers and staff. Pt is taking medication. Pt has no complaints.Pt receptive to treatment and safety maintained on unit.   Principal Problem: Major depressive disorder, recurrent severe without psychotic features (HCC) Diagnosis:   Patient Active Problem List   Diagnosis Date Noted  . Major depressive disorder, recurrent severe without psychotic features (HCC) [F33.2] 11/07/2014  . Tobacco use disorder [F17.200] 11/07/2014  . Stimulant use disorder (HCC) crack cocaine [F15.90] 11/07/2014  . Alcohol use disorder, moderate, dependence (HCC) [F10.20] 11/07/2014  . CAD (coronary artery disease) [I25.10] 11/05/2014  . CVA (cerebral infarction) [I63.9] 10/19/2014  . LVH (left ventricular hypertrophy) [I51.7] 05/20/2013  . Obesity (BMI 30-39.9) [E66.9] 04/10/2013  . Aortic stenosis, mild [I35.0] 03/07/2013  . DM (diabetes mellitus), type 2, uncontrolled (HCC) [E11.65] 01/05/2013  . Essential  hypertension, malignant [I10] 01/25/2009   Total Time spent with patient: 30 minutes  Past Psychiatric History: Patient reports one prior suicidal attempt in the night is when he drank rat poison. He reports having 2 prior hospitalizations in our facility back in the 90s. He believes he was diagnosed with depression and schizophrenia. He denies receiving any prior treatment for substance abuse. Denies any history of self-injurious behaviors.  Risk to Self: Is patient at risk for suicide?: Yes Risk to Others:   no Prior Inpatient Therapy:   yes Prior Outpatient Therapy:   no  Alcohol Screening: Patient refused Alcohol Screening Tool: (no) 1. How often do you have a drink containing alcohol?: 2 to 4 times a month 2. How many drinks containing alcohol do you have on a typical day when you are drinking?: 3 or 4 3. How often do you have six or more drinks on one occasion?: Never Preliminary Score: 1 4. How often during the last year have you found that you were not able to stop drinking once you had started?: Never 5. How often during the last year have you failed to do what was normally expected from you becasue of drinking?: Never 6. How often during the last year have you needed a first drink in the morning to get yourself going after a heavy drinking session?: Never 7. How often during the last year have you had a feeling of guilt of remorse after drinking?: Never 8. How often during the last year have you been unable to remember what happened the night before because you had been drinking?: Never 9. Have you  or someone else been injured as a result of your drinking?: No 10. Has a relative or friend or a doctor or another health worker been concerned about your drinking or suggested you cut down?: No Alcohol Use Disorder Identification Test Final Score (AUDIT): 3 Brief Intervention: AUDIT score less than 7 or less-screening does not suggest unhealthy drinking-brief intervention not  indicated Substance Abuse History in the last 12 months: Yes.    Past Medical History: Patient suffers from hypertension, diabetes. He had an cerebrovascular accident about a year ago. He follows up with [facility]. He denies any history of surgeries. States that he has been compliant with his medications prior to admission. Denies any history of seizures. He does report a history of head trauma at home as he has fallen frequently and at times have lost consciousness.  Seen by cardiology while on the medical floor: 1) chest pain Atypical in nature, likely secondary to cocaine Cardiac enzymes negative, EKG unchanged from prior studies Cardiac catheterization last year given his history of chronic chest pain with minimal coronary artery disease of a ramus branch.  2) malignant hypertension Patient compliance in question Blood pressure has improved on outpatient medication regimen He does report that he has his medications at home Would continue current regimen, monitor --If blood pressure continues to run high, could add hydralazine  3) diabetes type 2, poorly controlled Needs outpatient referral to endocrine or primary care Suspect that patient compliance is an issue  5) history of stroke Likely secondary to chronic drug abuse  Unable to exclude PFO Would continue low-dose aspirin  6) LVH Previously was noted to be significant, Likely secondary to chronic hypertension.  Past Medical History  Diagnosis Date  . Hypertension   . Diabetes mellitus   . GERD (gastroesophageal reflux disease)   . Pericarditis   . Polysubstance abuse     cocaine, marijuana  . Auditory hallucinations   . Depression   . Hx of medication noncompliance   . History of suicide attempt   . Stroke [facility]) February 2015    Right MCA aneurysm; head MRI - acute infarct of the right frontal lobe. Smaller acute infarct of the left frontal lobe. Thought  to be due to cerebral emboli. Left PICA infarct. Small chronic lacunar infarct right internal capsule. Chronic microvascular ischemic change with white matter change.  . Coronary artery disease     No reported catheterization. Negative Myoview stress test and 2013.  Marland Kitchen Syncope and collapse   . Bipolar disorder (HCC)   . Obesity, Class II, BMI 35-39.9, with comorbidity (HCC)   . Hypertensive hypertrophic cardiomyopathy [facility]) February 2015    Severe concentric LVH on echocardiogram.  . Aortic stenosis, mild February 2015    Past Surgical History  Procedure Laterality Date  . Pericardial fluid drainage    . Transthoracic echocardiogram  03/23/2013    EF 5560%. Elevated left atrial pressure. Severe concentric LVH. Mild aortic stenosis. -- Appearance of hypertensive hypertrophic heart disease. No clear cardioembolic source  . Nm myoview ltd  July 2013    No ischemia or infarct. There is inferior wall defec suggestive of diaphragmatic attenuation. Mild LV dilation at to be due to hypertensive heart disease. EF 55%. Poor exercise tolerance.  . Carotid dopplers:  03/23/2013    Interval thickening of bilateral common carotid no significant plaque formation. Tortuosity but no significant stenosis. Patent bilateral vertebral arteries   Family History:  Family History  Problem Relation Age of Onset  . Stroke  Mother   . Diabetes type II Mother   . Hypertension Mother   . Hypertension Father   . Arthritis Father   . Arthritis Sister   . Diabetes type II Sister   . Hypertension Sister    Family Psychiatric History: Patient reports that his father was an alcoholic. There is no history of suicides in his family. He thinks one of his cousins suffers from bipolar or schizophrenia  Social History: Patient is single, never married and has 3 children ages 22, 16 and 12 year old but he is not close to any of them.  The patient has been receiving SSI for the last 8 years. He also has Medicaid. In the past he used to work as a Merchandiser, retail. He was placed on SSI due to his mental illness and medical problems. He has a 10th grade education. As far as his legal history he reports that he had raped charges in the 44s. He used to be a registered sex offender but states that he was then removed from the list as this was allowed back then. Patient also had prior charges for DWIs. History  Alcohol Use  . 25.8 oz/week  . 3 Glasses of wine, 40 Cans of beer per week    Comment: occasionally; reports 1 pint of vodka per month at paydaysocially    History  Drug Use  . 1.00 per week  . Special: Marijuana, Cocaine    Comment: past    Social History   Social History  . Marital Status: Single    Spouse Name: N/A  . Number of Children: N/A  . Years of Education: N/A   Social History Main Topics  . Smoking status: Current Every Day Smoker -- 0.50 packs/day for 15 years    Types: Cigars  . Smokeless tobacco: None  . Alcohol Use: 25.8 oz/week    3 Glasses of wine, 40 Cans of beer per week     Comment: occasionally; reports 1 pint of vodka per month at paydaysocially  . Drug Use: 1.00 per week    Special: Marijuana, Cocaine     Comment: past  . Sexual Activity: Yes   Other Topics Concern  . None   Social History Narrative   Single with no children. No exercise.   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   Has a history of polysubstance abuse including cocaine. No she she urine toxicology screen in the hospital was positive. He also current smoker.   He is seen by behavioral health for bipolar disorder.        Sleep: Poor  Appetite:  Good  Current Medications: Current Facility-Administered Medications  Medication Dose Route Frequency Provider Last Rate Last Dose  .  acetaminophen (TYLENOL) tablet 650 mg  650 mg Oral Q6H PRN Beau Fanny, MD   650 mg at 11/07/14 1402  . alum & mag hydroxide-simeth (MAALOX/MYLANTA) 200-200-20 MG/5ML suspension 30 mL  30 mL Oral Q4H PRN Beau Fanny, MD      . amLODipine (NORVASC) tablet 10 mg  10 mg Oral Daily Jimmy Footman, MD   10 mg at 11/08/14 0951  . aspirin EC tablet 81 mg  81 mg Oral Daily Jimmy Footman, MD   81 mg at 11/08/14 0951  . atorvastatin (LIPITOR) tablet 20 mg  20 mg Oral q1800 Jimmy Footman, MD   20 mg at 11/07/14 1732  . chlorthalidone (HYGROTON) tablet 25 mg  25 mg Oral Daily  Jimmy Footman, MD   25 mg at 11/08/14 [phone]  . cloNIDine (CATAPRES) tablet 0.2 mg  0.2 mg Oral BID Jimmy Footman, MD   0.2 mg at 11/08/14 0951  . clopidogrel (PLAVIX) tablet 75 mg  75 mg Oral Daily Jimmy Footman, MD   75 mg at 11/08/14 0951  . hydrOXYzine (ATARAX/VISTARIL) tablet 50 mg  50 mg Oral TID PRN Jimmy Footman, MD   50 mg at 11/07/14 1732  . insulin aspart (novoLOG) injection 0-15 Units  0-15 Units Subcutaneous TID WC Jimmy Footman, MD   3 Units at 11/08/14 0831  . insulin aspart (novoLOG) injection 10 Units  10 Units Subcutaneous BID WC Jimmy Footman, MD   10 Units at 11/08/14 [phone]  . lisinopril (PRINIVIL,ZESTRIL) tablet 40 mg  40 mg Oral Daily Jimmy Footman, MD   40 mg at 11/08/14 0951  . magnesium hydroxide (MILK OF MAGNESIA) suspension 30 mL  30 mL Oral Daily PRN Beau Fanny, MD      . metFORMIN (GLUCOPHAGE) tablet 500 mg  500 mg Oral BID AC Jimmy Footman, MD   500 mg at 11/08/14 5409  . nicotine (NICODERM CQ - dosed in mg/24 hours) patch 21 mg  21 mg Transdermal Daily Jimmy Footman, MD   21 mg at 11/07/14 1228  . pantoprazole (PROTONIX) EC tablet 40 mg  40 mg Oral Daily Jimmy Footman, MD   40 mg at 11/08/14 0951  . sertraline (ZOLOFT) tablet 25 mg  25 mg Oral Daily  Jimmy Footman, MD   25 mg at 11/08/14 0951  . traZODone (DESYREL) tablet 150 mg  150 mg Oral QHS Beau Fanny, MD   150 mg at 11/07/14 2118    Lab Results:  Results for orders placed or performed during the hospital encounter of 11/06/14 (from the past 48 hour(s))  Glucose, capillary     Status: Abnormal   Collection Time: 11/07/14 12:19 PM  Result Value Ref Range   Glucose-Capillary 207 (H) 65 - 99 mg/dL  Glucose, capillary     Status: Abnormal   Collection Time: 11/07/14  5:00 PM  Result Value Ref Range   Glucose-Capillary 194 (H) 65 - 99 mg/dL  Glucose, capillary     Status: Abnormal   Collection Time: 11/07/14  9:00 PM  Result Value Ref Range   Glucose-Capillary 166 (H) 65 - 99 mg/dL   Comment 1 Notify RN   Glucose, capillary     Status: Abnormal   Collection Time: 11/08/14  6:56 AM  Result Value Ref Range   Glucose-Capillary 185 (H) 65 - 99 mg/dL   Comment 1 Notify RN   Glucose, capillary     Status: Abnormal   Collection Time: 11/08/14  7:39 AM  Result Value Ref Range   Glucose-Capillary 168 (H) 65 - 99 mg/dL   Comment 1 Notify RN     Physical Findings: AIMS:  , ,  ,  ,    CIWA:    COWS:     Musculoskeletal: Strength & Muscle Tone: within normal limits Gait & Station: unsteady Patient leans: N/A  Psychiatric Specialty Exam: Review of Systems  Constitutional: Negative.   HENT: Negative.   Eyes: Negative.   Respiratory: Negative.   Cardiovascular: Negative.   Gastrointestinal: Negative.   Genitourinary: Negative.   Musculoskeletal: Negative.   Neurological: Negative.   Endo/Heme/Allergies: Negative.   Psychiatric/Behavioral: Positive for depression, memory loss and substance abuse. Negative for suicidal ideas and hallucinations. The patient is nervous/anxious and has insomnia.  Blood pressure 155/110, pulse 87, temperature 98 F (36.7 C), temperature source Oral, resp. rate 20, height 5\' 11"  (1.803 m), weight 109.317 kg (241 lb), SpO2 98  %.Body mass index is 33.63 kg/(m^2).  General Appearance: Well Groomed  Patent attorney::  Fair  Speech:  Slurred  Volume:  Increased  Mood:  Irritable  Affect:  Constricted  Thought Process:  concrete  Orientation:  Full (Time, Place, and Person)  Thought Content:  Hallucinations: None  Suicidal Thoughts:  No  Homicidal Thoughts:  No  Memory:  Immediate;   Fair Recent;   Fair Remote;   Good  Judgement:  Poor  Insight:  Shallow  Psychomotor Activity:  Normal  Concentration:  NA  Recall:  NA  Fund of Knowledge:Fair  Language: Fair  Akathisia:  No  Handed:    AIMS (if indicated):     Assets:  Financial Resources/Insurance  ADL's:  Intact  Cognition: WNL  Sleep:  Number of Hours: 7.5   Treatment Plan Summary: Daily contact with patient to assess and evaluate symptoms and progress in treatment and Medication management   54 year old single African-American male with a multitude of chronic medical conditions (diabetes, hypertension, cerebrovascular disease) along with history of substance abuse and depression. Patient presents to our emergency department with uncontrollable hypertension and chest pain in the setting of increasing use of Viagra and crack.   Major depressive disorder: Patient reports history of prior psychiatric treatment in the 90s. He reports worsening of mood and passive suicidal thoughts since he had a stroke and year ago. Patient was prescribed with Wellbutrin and BuSpar. Wellbutrin and BuSpar were discontinued on October 3 and he was restarted on sertraline 25 mg a day.  Insomnia: Continue the patient on trazodone 150 mg by mouth daily at bedtime  Alcohol use disorder-cocaine use disorder: Patient will be referred for outpatient substance abuse treatment once discharged.  Diabetes: Patient will be restarted on home regimen of NovoLog 10 units twice a day with meals. He will be restarted on a low carbohydrate diet. He will be started on supplemental insulin. Also  continue metformin 500 mg by mouth twice a day  Hypertension: Continue chlorthalidone 25 mg a day, Norvasc 10 mg a day, lisinopril 40 mg a day and clonidine 0.2 mg tid. Vital signs will be check every 8 hours. Patient will receive a low sodium diet  Hyperlipidemia: Patient will be continued on Lipitor 20 mg daily  Cardiovascular disease and cerebrovascular disease: Continue aspirin 81 mg and Plavix 75 mg a day  GERD: Continue the patient on Protonix 40 mg a day  Tobacco use disorder: Patient will be started on a nicotine patch 21 mg a day  Unsteady gait: Physical therapy evaluated the patient. They recommended rolling walker and for the patient to continue physical therapy through home health  Discharge disposition: Patient is requesting assistance with possible assisted living facility. Will discuss this with Child psychotherapist. Patient has been in our hospital 6 times over the last 7 months. Clearly having issues with compliance as hemoglobin A1c is about 10 and blood pressure is poorly controlled.  Social worker is currently working on [employer] number.  Possible discharge in the next day or 2 .  Jimmy Footman 11/08/2014, 11:06 AM

## 2014-11-08 NOTE — Plan of Care (Signed)
Problem: Diagnosis: Increased Risk For Suicide Attempt Goal: STG-Patient Will Comply With Medication Regime Outcome: Progressing Patient taking all ordered medications.

## 2014-11-09 LAB — GLUCOSE, CAPILLARY
GLUCOSE-CAPILLARY: 152 mg/dL — AB (ref 65–99)
Glucose-Capillary: 159 mg/dL — ABNORMAL HIGH (ref 65–99)
Glucose-Capillary: 190 mg/dL — ABNORMAL HIGH (ref 65–99)
Glucose-Capillary: 128 mg/dL — ABNORMAL HIGH (ref 65–99)

## 2014-11-09 MED ORDER — SERTRALINE HCL 25 MG PO TABS
50.0000 mg | ORAL_TABLET | Freq: Every day | ORAL | Status: DC
  Administered 2014-11-09 – 2014-11-11 (×3): 50 mg via ORAL
  Filled 2014-11-09 (×2): qty 2

## 2014-11-09 NOTE — [facility] Group Notes (Signed)
[facility] LCSW Aftercare Discharge Planning Group Note   11/09/2014 11:37 AM  Participation Quality:  Patient did not attend group.  Lulu Riding, MSW, LCSWA

## 2014-11-09 NOTE — [facility] Group Notes (Signed)
[facility] Group Notes:  (Nursing/MHT/Case Management/Adjunct)  Date:  11/09/2014  Time:  2:19 PM  Type of Therapy:  Psychoeducational Skills  Participation Level:  Did Not Attend   Darrow Bussing 11/09/2014, 2:19 PM

## 2014-11-09 NOTE — Progress Notes (Signed)
A&Ox3, denied pain, denied SIB/SI/HI, denied AV/H, interacting well with peers, no behavioral problems, will continue with POC. 

## 2014-11-09 NOTE — Progress Notes (Signed)
D: Patient stated slept fair last night .Stated appetite is fair  and energy level  Is normal. Stated concentration is good . Stated on Depression scale 5 , hopeless 5 and anxiety 5.( low 0-10 high) Denies suicidal  homicidal ideations  .  No auditory hallucinations  No pain concerns . Appropriate ADL'S. Interacting with peers and staff. Speech pressured  Limited insight  inbehavior A: Encourage patient participation with unit programming . Instruction  Given on  Medication ,needing re instructions  R: Voice no other concerns. Staff continue to monitor

## 2014-11-09 NOTE — Progress Notes (Addendum)
[facility] MD Progress Note  11/09/2014 3:30 PM [name] [name].  MRN:  [id number] Subjective: Patient was seen today he continues to report depressed mood, thoughts of hopelessness helplessness and worthlessness. He complains of poor sleep, poor energy poor appetite and poor concentration. He is states he does not plan to hurt himself here what at the hospital. He does fear about being discharged to the streets "because I don't know what will happen them".  Denies auditory or visual hallucinations, denies homicidal ideations. Denies side effects from medications. Denies physical complaints.  Patient was agitated yesterday. He started claiming that he was tired of hearing that people were going to help him and nobody was trying to help him. He stated that he cannot care for himself at home. Later on he told the social worker he was not able to return to his apartment anymore because he was evicted this information was not provided by him at admission.  Earlier he had reported that he had a low income apartment and that he was receiving home health.  Per records this patient started voicing suicidality when he was going to be discharged from the medical floor after being admitted for chest pain.  He clearly has not been compliant with any of his medications for blood pressure or diabetes and in addition he has been using illicit substances and alcohol.  He complains of having cognitive issues that limit his ability to comply with medications. He also reports history of falls at home. Seen by PT that confirms the patient is a high risk for falls as a result of his a stroke a year ago.  Patient has been in our hospital or emergency department at least once a month since April.  Per nursing: D: Patient refused to work with PT today. Has attended some groups. Appetite good. Denies SI/HI/AVH. A: Taking meds; on q 15 minute checks; offered emotional support. R: Cooperative.   Principal Problem: Major depressive  disorder, recurrent severe without psychotic features (HCC) Diagnosis:   Patient Active Problem List   Diagnosis Date Noted  . Major depressive disorder, recurrent severe without psychotic features (HCC) [F33.2] 11/07/2014  . Tobacco use disorder [F17.200] 11/07/2014  . Stimulant use disorder (HCC) crack cocaine [F15.90] 11/07/2014  . Alcohol use disorder, moderate, dependence (HCC) [F10.20] 11/07/2014  . CAD (coronary artery disease) [I25.10] 11/05/2014  . CVA (cerebral infarction) [I63.9] 10/19/2014  . LVH (left ventricular hypertrophy) [I51.7] 05/20/2013  . Obesity (BMI 30-39.9) [E66.9] 04/10/2013  . Aortic stenosis, mild [I35.0] 03/07/2013  . DM (diabetes mellitus), type 2, uncontrolled (HCC) [E11.65] 01/05/2013  . Essential hypertension, malignant [I10] 01/25/2009   Total Time spent with patient: 30 minutes  Past Psychiatric History: Patient reports one prior suicidal attempt in the night is when he drank rat poison. He reports having 2 prior hospitalizations in our facility back in the 90s. He believes he was diagnosed with depression and schizophrenia. He denies receiving any prior treatment for substance abuse. Denies any history of self-injurious behaviors.  Risk to Self: Is patient at risk for suicide?: Yes Risk to Others:   no Prior Inpatient Therapy:   yes Prior Outpatient Therapy:   no  Alcohol Screening: Patient refused Alcohol Screening Tool: (no) 1. How often do you have a drink containing alcohol?: 2 to 4 times a month 2. How many drinks containing alcohol do you have on a typical day when you are drinking?: 3 or 4 3. How often do you have six or more drinks on one  occasion?: Never Preliminary Score: 1 4. How often during the last year have you found that you were not able to stop drinking once you had started?: Never 5. How often during the last year have you failed to do what was normally expected from you becasue of drinking?: Never 6. How often during the last  year have you needed a first drink in the morning to get yourself going after a heavy drinking session?: Never 7. How often during the last year have you had a feeling of guilt of remorse after drinking?: Never 8. How often during the last year have you been unable to remember what happened the night before because you had been drinking?: Never 9. Have you or someone else been injured as a result of your drinking?: No 10. Has a relative or friend or a doctor or another health worker been concerned about your drinking or suggested you cut down?: No Alcohol Use Disorder Identification Test Final Score (AUDIT): 3 Brief Intervention: AUDIT score less than 7 or less-screening does not suggest unhealthy drinking-brief intervention not indicated Substance Abuse History in the last 12 months: Yes.    Past Medical History: Patient suffers from hypertension, diabetes. He had an cerebrovascular accident about a year ago. He follows up with [facility]. He denies any history of surgeries. States that he has been compliant with his medications prior to admission. Denies any history of seizures. He does report a history of head trauma at home as he has fallen frequently and at times have lost consciousness.  Seen by cardiology while on the medical floor: 1) chest pain Atypical in nature, likely secondary to cocaine Cardiac enzymes negative, EKG unchanged from prior studies Cardiac catheterization last year given his history of chronic chest pain with minimal coronary artery disease of a ramus branch.  2) malignant hypertension Patient compliance in question Blood pressure has improved on outpatient medication regimen He does report that he has his medications at home Would continue current regimen, monitor --If blood pressure continues to run high, could add hydralazine  3) diabetes type 2, poorly controlled Needs outpatient referral to endocrine or primary care Suspect that patient  compliance is an issue  5) history of stroke Likely secondary to chronic drug abuse  Unable to exclude PFO Would continue low-dose aspirin  6) LVH Previously was noted to be significant, Likely secondary to chronic hypertension.  Past Medical History  Diagnosis Date  . Hypertension   . Diabetes mellitus   . GERD (gastroesophageal reflux disease)   . Pericarditis   . Polysubstance abuse     cocaine, marijuana  . Auditory hallucinations   . Depression   . Hx of medication noncompliance   . History of suicide attempt   . Stroke [facility]) February 2015    Right MCA aneurysm; head MRI - acute infarct of the right frontal lobe. Smaller acute infarct of the left frontal lobe. Thought to be due to cerebral emboli. Left PICA infarct. Small chronic lacunar infarct right internal capsule. Chronic microvascular ischemic change with white matter change.  . Coronary artery disease     No reported catheterization. Negative Myoview stress test and 2013.  Marland Kitchen Syncope and collapse   . Bipolar disorder (HCC)   . Obesity, Class II, BMI 35-39.9, with comorbidity (HCC)   . Hypertensive hypertrophic cardiomyopathy [facility]) February 2015    Severe concentric LVH on echocardiogram.  . Aortic stenosis, mild February 2015    Past Surgical History  Procedure Laterality Date  .  Pericardial fluid drainage    . Transthoracic echocardiogram  03/23/2013    EF 5560%. Elevated left atrial pressure. Severe concentric LVH. Mild aortic stenosis. -- Appearance of hypertensive hypertrophic heart disease. No clear cardioembolic source  . Nm myoview ltd  July 2013    No ischemia or infarct. There is inferior wall defec suggestive of diaphragmatic attenuation. Mild LV dilation at to be due to hypertensive heart disease. EF 55%. Poor exercise tolerance.  . Carotid dopplers:  03/23/2013    Interval thickening of bilateral common  carotid no significant plaque formation. Tortuosity but no significant stenosis. Patent bilateral vertebral arteries   Family History:  Family History  Problem Relation Age of Onset  . Stroke Mother   . Diabetes type II Mother   . Hypertension Mother   . Hypertension Father   . Arthritis Father   . Arthritis Sister   . Diabetes type II Sister   . Hypertension Sister    Family Psychiatric History: Patient reports that his father was an alcoholic. There is no history of suicides in his family. He thinks one of his cousins suffers from bipolar or schizophrenia  Social History: Patient is single, never married and has 3 children ages 5, 65 and 54 year old but he is not close to any of them. The patient has been receiving SSI for the last 8 years. He also has Medicaid. In the past he used to work as a Merchandiser, retail. He was placed on SSI due to his mental illness and medical problems. He has a 10th grade education. As far as his legal history he reports that he had raped charges in the 47s. He used to be a registered sex offender but states that he was then removed from the list as this was allowed back then. Patient also had prior charges for DWIs. History  Alcohol Use  . 25.8 oz/week  . 3 Glasses of wine, 40 Cans of beer per week    Comment: occasionally; reports 1 pint of vodka per month at paydaysocially    History  Drug Use  . 1.00 per week  . Special: Marijuana, Cocaine    Comment: past    Social History   Social History  . Marital Status: Single    Spouse Name: N/A  . Number of Children: N/A  . Years of Education: N/A   Social History Main Topics  . Smoking status: Current Every Day Smoker -- 0.50 packs/day for 15 years    Types: Cigars  . Smokeless tobacco: None  . Alcohol Use: 25.8 oz/week    3 Glasses of wine, 40 Cans of beer per week     Comment: occasionally; reports 1  pint of vodka per month at paydaysocially  . Drug Use: 1.00 per week    Special: Marijuana, Cocaine     Comment: past  . Sexual Activity: Yes   Other Topics Concern  . None   Social History Narrative   Single with no children. No exercise.   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   Has a history of polysubstance abuse including cocaine. No she she urine toxicology screen in the hospital was positive. He also current smoker.   He is seen by behavioral health for bipolar disorder.        Sleep: Poor  Appetite:  Good  Current Medications: Current Facility-Administered Medications  Medication Dose Route Frequency Provider Last Rate Last Dose  . acetaminophen (TYLENOL) tablet 650 mg  650 mg Oral Q6H PRN Beau Fanny, MD   650 mg at 11/08/14 1241  . alum & mag hydroxide-simeth (MAALOX/MYLANTA) 200-200-20 MG/5ML suspension 30 mL  30 mL Oral Q4H PRN Beau Fanny, MD      . amLODipine (NORVASC) tablet 10 mg  10 mg Oral Daily Jimmy Footman, MD   10 mg at 11/09/14 0927  . aspirin EC tablet 81 mg  81 mg Oral Daily Jimmy Footman, MD   81 mg at 11/09/14 0929  . atorvastatin (LIPITOR) tablet 20 mg  20 mg Oral q1800 Jimmy Footman, MD   20 mg at 11/08/14 1704  . chlorthalidone (HYGROTON) tablet 25 mg  25 mg Oral Daily Jimmy Footman, MD   25 mg at 11/09/14 0929  . cloNIDine (CATAPRES) tablet 0.2 mg  0.2 mg Oral BID Jimmy Footman, MD   0.2 mg at 11/09/14 1610  . clopidogrel (PLAVIX) tablet 75 mg  75 mg Oral Daily Jimmy Footman, MD   75 mg at 11/09/14 0929  . hydrOXYzine (ATARAX/VISTARIL) tablet 50 mg  50 mg Oral TID PRN Jimmy Footman, MD   50 mg at 11/08/14 1241  . insulin aspart (novoLOG) injection 0-15 Units  0-15 Units Subcutaneous TID WC Jimmy Footman, MD   3 Units at 11/09/14 1205  . insulin aspart (novoLOG) injection  10 Units  10 Units Subcutaneous BID WC Jimmy Footman, MD   10 Units at 11/09/14 [phone]  . lisinopril (PRINIVIL,ZESTRIL) tablet 40 mg  40 mg Oral Daily Jimmy Footman, MD   40 mg at 11/09/14 5409  . magnesium hydroxide (MILK OF MAGNESIA) suspension 30 mL  30 mL Oral Daily PRN Beau Fanny, MD      . metFORMIN (GLUCOPHAGE) tablet 500 mg  500 mg Oral BID AC Jimmy Footman, MD   500 mg at 11/09/14 8119  . nicotine (NICODERM CQ - dosed in mg/24 hours) patch 21 mg  21 mg Transdermal Daily Jimmy Footman, MD   21 mg at 11/09/14 0931  . pantoprazole (PROTONIX) EC tablet 40 mg  40 mg Oral Daily Jimmy Footman, MD   40 mg at 11/09/14 0929  . sertraline (ZOLOFT) tablet 50 mg  50 mg Oral Daily Jimmy Footman, MD   50 mg at 11/09/14 0932  . traZODone (DESYREL) tablet 150 mg  150 mg Oral QHS Beau Fanny, MD   150 mg at 11/08/14 2118    Lab Results:  Results for orders placed or performed during the hospital encounter of 11/06/14 (from the past 48 hour(s))  Glucose, capillary     Status: Abnormal   Collection Time: 11/07/14  5:00 PM  Result Value Ref Range   Glucose-Capillary 194 (H) 65 - 99 mg/dL  Glucose, capillary     Status: Abnormal   Collection Time: 11/07/14  9:00 PM  Result Value Ref Range   Glucose-Capillary 166 (H) 65 - 99 mg/dL   Comment 1 Notify RN   Glucose, capillary     Status: Abnormal   Collection Time: 11/08/14  6:56 AM  Result Value Ref Range   Glucose-Capillary 185 (H) 65 - 99 mg/dL   Comment 1 Notify RN   Glucose, capillary     Status: Abnormal   Collection Time: 11/08/14  7:39 AM  Result Value Ref Range   Glucose-Capillary 168 (H) 65 - 99 mg/dL   Comment 1 Notify RN   Glucose, capillary     Status: Abnormal   Collection Time: 11/08/14 12:08 PM  Result Value Ref Range  Glucose-Capillary 181 (H) 65 - 99 mg/dL  Glucose, capillary     Status: Abnormal   Collection Time: 11/08/14  5:01 PM  Result Value Ref  Range   Glucose-Capillary 134 (H) 65 - 99 mg/dL  Glucose, capillary     Status: Abnormal   Collection Time: 11/08/14  9:19 PM  Result Value Ref Range   Glucose-Capillary 115 (H) 65 - 99 mg/dL  Glucose, capillary     Status: Abnormal   Collection Time: 11/09/14  6:55 AM  Result Value Ref Range   Glucose-Capillary 152 (H) 65 - 99 mg/dL  Glucose, capillary     Status: Abnormal   Collection Time: 11/09/14 12:03 PM  Result Value Ref Range   Glucose-Capillary 159 (H) 65 - 99 mg/dL   Comment 1 Notify RN     Physical Findings: AIMS:  , ,  ,  ,    CIWA:  CIWA-Ar Total: 0 COWS:     Musculoskeletal: Strength & Muscle Tone: within normal limits Gait & Station: unsteady Patient leans: N/A  Psychiatric Specialty Exam: Review of Systems  Constitutional: Negative.   HENT: Negative.   Eyes: Negative.   Respiratory: Negative.   Cardiovascular: Negative.   Gastrointestinal: Negative.   Genitourinary: Negative.   Musculoskeletal: Negative.   Skin: Negative.   Neurological: Negative.   Endo/Heme/Allergies: Negative.   Psychiatric/Behavioral: Negative.     Blood pressure 143/91, pulse 80, temperature 98.5 F (36.9 C), temperature source Oral, resp. rate 18, height  (1.803 m), weight 109.317 kg (241 lb), SpO2 98 %.Body mass index is 33.63 kg/(m^2).  General Appearance: Well Groomed  Patent attorney::  Fair  Speech:  Slurred  Volume:  Increased  Mood:  Irritable  Affect:  Constricted  Thought Process:  concrete  Orientation:  Full (Time, Place, and Person)  Thought Content:  Hallucinations: None  Suicidal Thoughts:  No  Homicidal Thoughts:  No  Memory:  Immediate;   Fair Recent;   Fair Remote;   Good  Judgement:  Poor  Insight:  Shallow  Psychomotor Activity:  Normal  Concentration:  NA  Recall:  NA  Fund of Knowledge:Fair  Language: Fair  Akathisia:  No  Handed:    AIMS (if indicated):     Assets:  Financial Resources/Insurance  ADL's:  Intact  Cognition: WNL  Sleep:   Number of Hours: 7.75   Treatment Plan Summary: Daily contact with patient to assess and evaluate symptoms and progress in treatment and Medication management   54 year old single African-American male with a multitude of chronic medical conditions (diabetes, hypertension, cerebrovascular disease) along with history of substance abuse and depression. Patient presents to our emergency department with uncontrollable hypertension and chest pain in the setting of increasing use of Viagra and crack.   Major depressive disorder: Patient reports history of prior psychiatric treatment in the 90s. He reports worsening of mood and passive suicidal thoughts since he had a stroke and year ago. Patient was prescribed with Wellbutrin and BuSpar. Wellbutrin and BuSpar were discontinued on October 3 and he was restarted on sertraline.  Today I will increase sertraline to 50 mg   Insomnia: Continue the patient on trazodone 150 mg by mouth daily at bedtime  Alcohol use disorder-cocaine use disorder: Patient will be referred for outpatient substance abuse treatment once discharged.  Diabetes: Patient will be restarted on home regimen of NovoLog 10 units twice a day with meals. He will be restarted on a low carbohydrate diet. He will be started on supplemental insulin.  Also continue metformin 500 mg by mouth twice a day  Hypertension: Continue chlorthalidone 25 mg a day, Norvasc 10 mg a day, lisinopril 40 mg a day and clonidine 0.2 mg bid. Vital signs will be check every 8 hours. Patient will receive a low sodium diet  Hyperlipidemia: Patient will be continued on Lipitor 20 mg daily  Cardiovascular disease and cerebrovascular disease: Continue aspirin 81 mg and Plavix 75 mg a day  GERD: Continue the patient on Protonix 40 mg a day  Tobacco use disorder: Patient will be started on a nicotine patch 21 mg a day  Unsteady gait: Physical therapy evaluated the patient. They recommended rolling walker and for the  patient to continue physical therapy through home health.  Refused to work with PT yesterday  Discharge disposition: Patient is requesting assistance with possible assisted living facility. Will discuss this with Child psychotherapist. Patient has been in our hospital 6 times over the last 7 months. Clearly having issues with compliance as hemoglobin A1c is about 10 and blood pressure is poorly controlled.  Social worker is currently working on [employer] number.  Tentative discharge date Friday.  Jimmy Footman 11/09/2014, 3:30 PM

## 2014-11-09 NOTE — Plan of Care (Signed)
Problem: Ineffective individual coping Goal: STG: Patient will remain free from self harm Outcome: Progressing Medications (Seroquel 150 mg and Clonidine 0.2 mg BP=145/100) administered as ordered by the physician, medications Therapeutic Effects, SEs and Adverse effects discussed, questions encouraged; CBG=115, no PRN given, 15 minute checks maintained for safety, clinical and moral support provided, patient encouraged to continue to express feelings and demonstrate safe care. Patient remain free from harm, will continue to monitor.

## 2014-11-09 NOTE — Plan of Care (Signed)
Problem: Alteration in mood & ability to function due to Goal: LTG-Pt reports reduction in suicidal thoughts (Patient reports reduction in suicidal thoughts and is able to verbalize a safety plan for whenever patient is feeling suicidal) Outcome: Progressing Denies suicidal ideations     

## 2014-11-09 NOTE — [facility] Group Notes (Signed)
[facility] Group Notes:  (Nursing/MHT/Case Management/Adjunct)  Date:  11/09/2014  Time:  5:15 AM  Type of Therapy:  Group Therapy  Participation Level:  Active  Participation Quality:  Drowsy  Affect:  Flat  Cognitive:  Appropriate  Insight:  Appropriate  Engagement in Group:  Limited  Modes of Intervention:  n/a  Summary of Progress/Problems:  [name] [name] 11/09/2014, 5:15 AM

## 2014-11-10 LAB — GLUCOSE, CAPILLARY
GLUCOSE-CAPILLARY: 159 mg/dL — AB (ref 65–99)
Glucose-Capillary: 140 mg/dL — ABNORMAL HIGH (ref 65–99)
Glucose-Capillary: 180 mg/dL — ABNORMAL HIGH (ref 65–99)
Glucose-Capillary: 108 mg/dL — ABNORMAL HIGH (ref 65–99)

## 2014-11-10 MED ORDER — INSULIN ASPART 100 UNIT/ML ~~LOC~~ SOLN: 10.0000 [IU] | Freq: Two times a day (BID) | SUBCUTANEOUS | Status: DC

## 2014-11-10 MED ORDER — TRAZODONE HCL 150 MG PO TABS: 150.0000 mg | ORAL_TABLET | Freq: Every day | ORAL | Status: DC

## 2014-11-10 MED ORDER — SERTRALINE HCL 100 MG PO TABS: 50.0000 mg | ORAL_TABLET | Freq: Every day | ORAL | Status: DC

## 2014-11-10 MED ORDER — ASPIRIN 81 MG PO TBEC: 81.0000 mg | DELAYED_RELEASE_TABLET | Freq: Every day | ORAL | Status: DC

## 2014-11-10 MED ORDER — CHLORTHALIDONE 25 MG PO TABS: 25.0000 mg | ORAL_TABLET | Freq: Every day | ORAL | Status: DC

## 2014-11-10 MED ORDER — CLOPIDOGREL BISULFATE 75 MG PO TABS: 75.0000 mg | ORAL_TABLET | Freq: Every day | ORAL | Status: DC

## 2014-11-10 MED ORDER — METFORMIN HCL 500 MG PO TABS: 500.0000 mg | ORAL_TABLET | Freq: Two times a day (BID) | ORAL | Status: DC

## 2014-11-10 MED ORDER — AMLODIPINE BESYLATE 10 MG PO TABS: 10.0000 mg | ORAL_TABLET | Freq: Every day | ORAL | Status: DC

## 2014-11-10 MED ORDER — LISINOPRIL 40 MG PO TABS: 40.0000 mg | ORAL_TABLET | Freq: Every day | ORAL | Status: DC

## 2014-11-10 MED ORDER — CLONIDINE HCL 0.2 MG PO TABS: 0.2000 mg | ORAL_TABLET | Freq: Two times a day (BID) | ORAL | Status: DC

## 2014-11-10 MED ORDER — HYDROXYZINE HCL 25 MG PO TABS
25.0000 mg | ORAL_TABLET | Freq: Three times a day (TID) | ORAL | Status: DC | PRN
  Administered 2014-11-11: 25 mg via ORAL
  Filled 2014-11-10: qty 1

## 2014-11-10 MED ORDER — ATORVASTATIN CALCIUM 20 MG PO TABS: 20.0000 mg | ORAL_TABLET | Freq: Every day | ORAL | Status: DC

## 2014-11-10 MED ORDER — HYDROXYZINE HCL 25 MG PO TABS: 25.0000 mg | ORAL_TABLET | Freq: Three times a day (TID) | ORAL | Status: DC | PRN

## 2014-11-10 MED ORDER — PANTOPRAZOLE SODIUM 40 MG PO TBEC: 40.0000 mg | DELAYED_RELEASE_TABLET | Freq: Every day | ORAL | Status: DC

## 2014-11-10 NOTE — Progress Notes (Signed)
Observed in the Day Room interacting well with others, called to a private area to maintain confidentiality of PHI. Pleasant on approach, "I had a bad day today, no home to go to; SW will let me know tomorrow.  " c/o 7/10 HA, will address momentarily; will monitor closely and maintain safety.

## 2014-11-10 NOTE — Plan of Care (Signed)
Problem: Alteration in mood & ability to function due to Goal: LTG-Pt reports reduction in suicidal thoughts (Patient reports reduction in suicidal thoughts and is able to verbalize a safety plan for whenever patient is feeling suicidal)  Outcome: Progressing Denies suicidal ideations , present more positive  With approach

## 2014-11-10 NOTE — Progress Notes (Signed)
PT Cancellation Note  Patient Details Name: [name] [name]. MRN: [id number] DOB: Oct 12, 1960   Cancelled Treatment:    Reason Eval/Treat Not Completed: Other (comment) (Pt at group therapy, unable to be seen at this time. Will re-attempt at later time/date. )   Georgina Peer 11/10/2014, 11:38 AM

## 2014-11-10 NOTE — Progress Notes (Signed)
D:No unit participation. Resting in bed until meal tray  Come . Appetite good. Aware of possible discharge tomorrow . Patient voiced excitement  About going to another facility Patient denies suicidally  And auditory hallucinations.   A: Encourage patient participation and to come to staff for any concerns  R: Receptive to information received

## 2014-11-10 NOTE — Plan of Care (Signed)
Problem: Ineffective individual coping Goal: STG: Patient will remain free from self harm Outcome: Progressing Pt complied with medications (Trazodone 150 mg and Catapres 0.2 mg -- BP=140/80) as ordered by the physician, medications Therapeutic Effects, SEs and Adverse effects discussed, questions encouraged; no PRN given, 15 minute checks maintained for safety, clinical and moral support provided, patient encouraged to continue to express feelings and demonstrate safe care. Patient remain free from harm, will continue to monitor.

## 2014-11-10 NOTE — Progress Notes (Signed)
[facility] MD Progress Note  11/10/2014 9:24 AM [name] [name].  MRN:  [id number] Subjective: Patient was seen today he continues to complain of severe depression and anxiety. He states that he has not been able to sleep well at night because he worries constantly about where he will go after discharge. The patient denies SI or HI. He complains of hearing voices however states these voices are not as bad as they were when he first came in. Today he has been reporting dry mouth and dizziness which he thinks is secondary to having his "blood sugar high".  Patient per nursing has been staying in his room and is not participating in any group activities. Patient's capillary blood sugar and blood pressure are much improved. He has been compliant with all medications. Patient is withdrawn to his room. Has minimal interactions with peers.   Patient was agitated on 10/4. He started claiming that he was tired of hearing that people were going to help him and nobody was trying to help him. He stated that he cannot care for himself at home. Later on he told the social worker he was not able to return to his apartment anymore because he was evicted this information was not provided by him at admission.  Earlier he had reported that he had a low income apartment and that he was receiving home health.  Per records this patient started voicing suicidality when he was going to be discharged from the medical floor after being admitted for chest pain.  He clearly has not been compliant with any of his medications for blood pressure or diabetes and in addition he has been using illicit substances and alcohol.  He complains of having cognitive issues that limit his ability to comply with medications. He also reports history of falls at home. Seen by PT that confirms the patient is a high risk for falls as a result of his a stroke a year ago.  Patient has been in our hospital or emergency department at least once a month since  April.  Per nursing: D: Patient stated slept fair last night .Stated appetite is fair and energy level Is normal. Stated concentration is good . Stated on Depression scale 5 , hopeless 5 and anxiety 5.( low 0-10 high) Denies suicidal homicidal ideations . No auditory hallucinations No pain concerns . Appropriate ADL'S. Interacting with peers and staff. Speech pressured Limited insight inbehavior A: Encourage patient participation with unit programming . Instruction Given on Medication ,needing re instructions  R: Voice no other concerns. Staff continue to monitor    Principal Problem: Major depressive disorder, recurrent severe without psychotic features (HCC) Diagnosis:   Patient Active Problem List   Diagnosis Date Noted  . Major depressive disorder, recurrent severe without psychotic features (HCC) [F33.2] 11/07/2014  . Tobacco use disorder [F17.200] 11/07/2014  . Stimulant use disorder (HCC) crack cocaine [F15.90] 11/07/2014  . Alcohol use disorder, moderate, dependence (HCC) [F10.20] 11/07/2014  . CAD (coronary artery disease) [I25.10] 11/05/2014  . CVA (cerebral infarction) [I63.9] 10/19/2014  . LVH (left ventricular hypertrophy) [I51.7] 05/20/2013  . Obesity (BMI 30-39.9) [E66.9] 04/10/2013  . Aortic stenosis, mild [I35.0] 03/07/2013  . DM (diabetes mellitus), type 2, uncontrolled (HCC) [E11.65] 01/05/2013  . Essential hypertension, malignant [I10] 01/25/2009   Total Time spent with patient: 30 minutes  Past Psychiatric History: Patient reports one prior suicidal attempt in the night is when he drank rat poison. He reports having 2 prior hospitalizations in our facility back in the  90s. He believes he was diagnosed with depression and schizophrenia. He denies receiving any prior treatment for substance abuse. Denies any history of self-injurious behaviors.  Risk to Self: Is patient at risk for suicide?: Yes Risk to Others:   no Prior Inpatient Therapy:   yes Prior  Outpatient Therapy:   no  Alcohol Screening: Patient refused Alcohol Screening Tool: (no) 1. How often do you have a drink containing alcohol?: 2 to 4 times a month 2. How many drinks containing alcohol do you have on a typical day when you are drinking?: 3 or 4 3. How often do you have six or more drinks on one occasion?: Never Preliminary Score: 1 4. How often during the last year have you found that you were not able to stop drinking once you had started?: Never 5. How often during the last year have you failed to do what was normally expected from you becasue of drinking?: Never 6. How often during the last year have you needed a first drink in the morning to get yourself going after a heavy drinking session?: Never 7. How often during the last year have you had a feeling of guilt of remorse after drinking?: Never 8. How often during the last year have you been unable to remember what happened the night before because you had been drinking?: Never 9. Have you or someone else been injured as a result of your drinking?: No 10. Has a relative or friend or a doctor or another health worker been concerned about your drinking or suggested you cut down?: No Alcohol Use Disorder Identification Test Final Score (AUDIT): 3 Brief Intervention: AUDIT score less than 7 or less-screening does not suggest unhealthy drinking-brief intervention not indicated Substance Abuse History in the last 12 months: Yes.    Past Medical History: Patient suffers from hypertension, diabetes. He had an cerebrovascular accident about a year ago. He follows up with [facility]. He denies any history of surgeries. States that he has been compliant with his medications prior to admission. Denies any history of seizures. He does report a history of head trauma at home as he has fallen frequently and at times have lost consciousness.  Seen by cardiology while on the medical floor: 1) chest pain Atypical in  nature, likely secondary to cocaine Cardiac enzymes negative, EKG unchanged from prior studies Cardiac catheterization last year given his history of chronic chest pain with minimal coronary artery disease of a ramus branch.  2) malignant hypertension Patient compliance in question Blood pressure has improved on outpatient medication regimen He does report that he has his medications at home Would continue current regimen, monitor --If blood pressure continues to run high, could add hydralazine  3) diabetes type 2, poorly controlled Needs outpatient referral to endocrine or primary care Suspect that patient compliance is an issue  5) history of stroke Likely secondary to chronic drug abuse  Unable to exclude PFO Would continue low-dose aspirin  6) LVH Previously was noted to be significant, Likely secondary to chronic hypertension.  Past Medical History  Diagnosis Date  . Hypertension   . Diabetes mellitus   . GERD (gastroesophageal reflux disease)   . Pericarditis   . Polysubstance abuse     cocaine, marijuana  . Auditory hallucinations   . Depression   . Hx of medication noncompliance   . History of suicide attempt   . Stroke [facility]) February 2015    Right MCA aneurysm; head MRI - acute infarct of the  right frontal lobe. Smaller acute infarct of the left frontal lobe. Thought to be due to cerebral emboli. Left PICA infarct. Small chronic lacunar infarct right internal capsule. Chronic microvascular ischemic change with white matter change.  . Coronary artery disease     No reported catheterization. Negative Myoview stress test and 2013.  Marland Kitchen Syncope and collapse   . Bipolar disorder (HCC)   . Obesity, Class II, BMI 35-39.9, with comorbidity (HCC)   . Hypertensive hypertrophic cardiomyopathy [facility]) February 2015    Severe concentric LVH on echocardiogram.  . Aortic stenosis, mild February 2015    Past  Surgical History  Procedure Laterality Date  . Pericardial fluid drainage    . Transthoracic echocardiogram  03/23/2013    EF 5560%. Elevated left atrial pressure. Severe concentric LVH. Mild aortic stenosis. -- Appearance of hypertensive hypertrophic heart disease. No clear cardioembolic source  . Nm myoview ltd  July 2013    No ischemia or infarct. There is inferior wall defec suggestive of diaphragmatic attenuation. Mild LV dilation at to be due to hypertensive heart disease. EF 55%. Poor exercise tolerance.  . Carotid dopplers:  03/23/2013    Interval thickening of bilateral common carotid no significant plaque formation. Tortuosity but no significant stenosis. Patent bilateral vertebral arteries   Family History:  Family History  Problem Relation Age of Onset  . Stroke Mother   . Diabetes type II Mother   . Hypertension Mother   . Hypertension Father   . Arthritis Father   . Arthritis Sister   . Diabetes type II Sister   . Hypertension Sister    Family Psychiatric History: Patient reports that his father was an alcoholic. There is no history of suicides in his family. He thinks one of his cousins suffers from bipolar or schizophrenia  Social History: Patient is single, never married and has 3 children ages 68, 76 and 53 year old but he is not close to any of them. The patient has been receiving SSI for the last 8 years. He also has Medicaid. In the past he used to work as a Merchandiser, retail. He was placed on SSI due to his mental illness and medical problems. He has a 10th grade education. As far as his legal history he reports that he had raped charges in the 51s. He used to be a registered sex offender but states that he was then removed from the list as this was allowed back then. Patient also had prior charges for DWIs. History  Alcohol Use  . 25.8 oz/week  . 3 Glasses of wine, 40 Cans of beer per week     Comment: occasionally; reports 1 pint of vodka per month at paydaysocially    History  Drug Use  . 1.00 per week  . Special: Marijuana, Cocaine    Comment: past    Social History   Social History  . Marital Status: Single    Spouse Name: N/A  . Number of Children: N/A  . Years of Education: N/A   Social History Main Topics  . Smoking status: Current Every Day Smoker -- 0.50 packs/day for 15 years    Types: Cigars  . Smokeless tobacco: None  . Alcohol Use: 25.8 oz/week    3 Glasses of wine, 40 Cans of beer per week     Comment: occasionally; reports 1 pint of vodka per month at paydaysocially  . Drug Use: 1.00 per week    Special: Marijuana, Cocaine     Comment: past  .  Sexual Activity: Yes   Other Topics Concern  . None   Social History Narrative   Single with no children. No exercise.   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   Has a history of polysubstance abuse including cocaine. No she she urine toxicology screen in the hospital was positive. He also current smoker.   He is seen by behavioral health for bipolar disorder.        Sleep: Poor  Appetite:  Good  Current Medications: Current Facility-Administered Medications  Medication Dose Route Frequency Provider Last Rate Last Dose  . acetaminophen (TYLENOL) tablet 650 mg  650 mg Oral Q6H PRN Beau Fanny, MD   650 mg at 11/08/14 1241  . alum & mag hydroxide-simeth (MAALOX/MYLANTA) 200-200-20 MG/5ML suspension 30 mL  30 mL Oral Q4H PRN Beau Fanny, MD      . amLODipine (NORVASC) tablet 10 mg  10 mg Oral Daily Jimmy Footman, MD   10 mg at 11/10/14 0917  . aspirin EC tablet 81 mg  81 mg Oral Daily Jimmy Footman, MD   81 mg at 11/10/14 0917  . atorvastatin (LIPITOR) tablet 20 mg  20 mg Oral q1800 Jimmy Footman, MD   20 mg at 11/09/14 1920  .  chlorthalidone (HYGROTON) tablet 25 mg  25 mg Oral Daily Jimmy Footman, MD   25 mg at 11/10/14 0917  . cloNIDine (CATAPRES) tablet 0.2 mg  0.2 mg Oral BID Jimmy Footman, MD   0.2 mg at 11/10/14 0917  . clopidogrel (PLAVIX) tablet 75 mg  75 mg Oral Daily Jimmy Footman, MD   75 mg at 11/10/14 0917  . hydrOXYzine (ATARAX/VISTARIL) tablet 50 mg  50 mg Oral TID PRN Jimmy Footman, MD   50 mg at 11/08/14 1241  . insulin aspart (novoLOG) injection 0-15 Units  0-15 Units Subcutaneous TID WC Jimmy Footman, MD   3 Units at 11/10/14 0750  . insulin aspart (novoLOG) injection 10 Units  10 Units Subcutaneous BID WC Jimmy Footman, MD   10 Units at 11/10/14 [id number]  . lisinopril (PRINIVIL,ZESTRIL) tablet 40 mg  40 mg Oral Daily Jimmy Footman, MD   40 mg at 11/10/14 0916  . magnesium hydroxide (MILK OF MAGNESIA) suspension 30 mL  30 mL Oral Daily PRN Beau Fanny, MD      . metFORMIN (GLUCOPHAGE) tablet 500 mg  500 mg Oral BID AC Jimmy Footman, MD   500 mg at 11/10/14 0751  . nicotine (NICODERM CQ - dosed in mg/24 hours) patch 21 mg  21 mg Transdermal Daily Jimmy Footman, MD   21 mg at 11/09/14 0931  . pantoprazole (PROTONIX) EC tablet 40 mg  40 mg Oral Daily Jimmy Footman, MD   40 mg at 11/10/14 0916  . sertraline (ZOLOFT) tablet 50 mg  50 mg Oral Daily Jimmy Footman, MD   50 mg at 11/10/14 0916  . traZODone (DESYREL) tablet 150 mg  150 mg Oral QHS Beau Fanny, MD   150 mg at 11/09/14 2111    Lab Results:  Results for orders placed or performed during the hospital encounter of 11/06/14 (from the past 48 hour(s))  Glucose, capillary     Status: Abnormal   Collection Time: 11/08/14 12:08 PM  Result Value Ref Range   Glucose-Capillary 181 (H) 65 - 99 mg/dL  Glucose, capillary     Status: Abnormal   Collection Time: 11/08/14  5:01 PM  Result Value Ref Range  Glucose-Capillary 134  (H) 65 - 99 mg/dL  Glucose, capillary     Status: Abnormal   Collection Time: 11/08/14  9:19 PM  Result Value Ref Range   Glucose-Capillary 115 (H) 65 - 99 mg/dL  Glucose, capillary     Status: Abnormal   Collection Time: 11/09/14  6:55 AM  Result Value Ref Range   Glucose-Capillary 152 (H) 65 - 99 mg/dL  Glucose, capillary     Status: Abnormal   Collection Time: 11/09/14 12:03 PM  Result Value Ref Range   Glucose-Capillary 159 (H) 65 - 99 mg/dL   Comment 1 Notify RN   Glucose, capillary     Status: Abnormal   Collection Time: 11/09/14  4:39 PM  Result Value Ref Range   Glucose-Capillary 190 (H) 65 - 99 mg/dL   Comment 1 Notify RN   Glucose, capillary     Status: Abnormal   Collection Time: 11/09/14  8:46 PM  Result Value Ref Range   Glucose-Capillary 128 (H) 65 - 99 mg/dL   Comment 1 Notify RN   Glucose, capillary     Status: Abnormal   Collection Time: 11/10/14  6:54 AM  Result Value Ref Range   Glucose-Capillary 159 (H) 65 - 99 mg/dL    Physical Findings: AIMS:  , ,  ,  ,    CIWA:  CIWA-Ar Total: 0 COWS:     Musculoskeletal: Strength & Muscle Tone: within normal limits Gait & Station: unsteady Patient leans: N/A  Psychiatric Specialty Exam: Review of Systems  Constitutional: Negative.   HENT: Negative.   Eyes: Negative.   Respiratory: Negative.   Cardiovascular: Negative.   Gastrointestinal: Negative.   Genitourinary: Negative.   Musculoskeletal: Negative.   Skin: Negative.   Neurological: Negative.   Endo/Heme/Allergies: Negative.   Psychiatric/Behavioral: Positive for depression, memory loss and substance abuse. Negative for suicidal ideas and hallucinations. The patient is nervous/anxious.        Passive SI    Blood pressure 150/95, pulse 76, temperature 98.3 F (36.8 C), temperature source Oral, resp. rate 20, height 5\' 11"  (1.803 m), weight 109.317 kg (241 lb), SpO2 98 %.Body mass index is 33.63 kg/(m^2).  General Appearance: Well Groomed  Proofreader::  Fair  Speech:  Slurred  Volume:  Increased  Mood:  Irritable  Affect:  Constricted  Thought Process:  concrete  Orientation:  Full (Time, Place, and Person)  Thought Content:  Hallucinations: None  Suicidal Thoughts:  No  Homicidal Thoughts:  No  Memory:  Immediate;   Fair Recent;   Fair Remote;   Good  Judgement:  Poor  Insight:  Shallow  Psychomotor Activity:  Normal  Concentration:  NA  Recall:  NA  Fund of Knowledge:Fair  Language: Fair  Akathisia:  No  Handed:    AIMS (if indicated):     Assets:  Financial Resources/Insurance  ADL's:  Intact  Cognition: WNL  Sleep:  Number of Hours: 6.45   Treatment Plan Summary: Daily contact with patient to assess and evaluate symptoms and progress in treatment and Medication management   54 year old single African-American male with a multitude of chronic medical conditions (diabetes, hypertension, cerebrovascular disease) along with history of substance abuse and depression. Patient presents to our emergency department with uncontrollable hypertension and chest pain in the setting of increasing use of Viagra and crack.   Major depressive disorder: Patient reports history of prior psychiatric treatment in the 90s. He reports worsening of mood and passive suicidal thoughts since he had  a stroke and year ago. Patient was prescribed with Wellbutrin and BuSpar. Wellbutrin and BuSpar were discontinued on October 3 and he was restarted on sertraline.  Continue sertraline 50 mg q day  Anxiety: pt has been started on vistaril  Prn.  I will decrease the Vistaril from 50 mg every 8 hours to 25 mg every 8 hours as patient is complaining of feeling lightheaded and having dry mouth.  Insomnia: Continue the patient on trazodone 150 mg by mouth daily at bedtime.  Patient reports poor sleep but per nursing staff patient has been sleeping between 6 and 7 hours every night.  Alcohol use disorder-cocaine use disorder: Patient will be referred  for outpatient substance abuse treatment once discharged.  No evidence of alcohol withdrawal during his stay in the hospital  Diabetes: Patient will be restarted on home regimen of NovoLog 10 units twice a day with meals. He will be restarted on a low carbohydrate diet. He will be started on supplemental insulin. Also continue metformin 500 mg by mouth twice a day.  CBS is well controlled.  Hypertension: Continue chlorthalidone 25 mg a day, Norvasc 10 mg a day, lisinopril 40 mg a day and clonidine 0.2 mg bid. Vital signs will be check every 8 hours. Patient will receive a low sodium diet. BP is improving   Hyperlipidemia: Patient will be continued on Lipitor 20 mg daily  Cardiovascular disease and cerebrovascular disease: Continue aspirin 81 mg and Plavix 75 mg a day  GERD: Continue the patient on Protonix 40 mg a day  Tobacco use disorder: Patient will be started on a nicotine patch 21 mg a day  Unsteady gait: Physical therapy evaluated the patient. They recommended rolling walker and for the patient to continue physical therapy through home health.  Uncooperative with PT.  We plan to discharge the patient to a assisted living facility where he can receive physical therapy.  Discharge disposition: Patient is requesting assistance with possible assisted living facility.  Patient has been in our hospital 6 times over the last 7 months. Clearly having issues with compliance as hemoglobin A1c is about 10 and blood pressure is poorly controlled.  Social worker received  PSAAR number today.  Referrals made to ALF. Tentative discharge date Friday.  Jimmy Footman 11/10/2014, 9:24 AM

## 2014-11-10 NOTE — Progress Notes (Signed)
PT Cancellation Note  Patient Details Name: [name] [name]. MRN: [id number] DOB: 1960-02-16   Cancelled Treatment:    Reason Eval/Treat Not Completed: Patient declined, no reason specified (re-attempted to treat pt this afternoon, pt refused treatment stating that he does not feel good due to his medicine. )   Georgina Peer 11/10/2014, 1:30 PM

## 2014-11-10 NOTE — [facility] Group Notes (Signed)
[facility] Group Notes:  (Nursing/MHT/Case Management/Adjunct)  Date:  11/10/2014  Time:  2:00 PM  Type of Therapy:  Psychoeducational Skills  Participation Level:  Did Not Attend   Marquette Old 11/10/2014, 2:00 PM

## 2014-11-10 NOTE — Progress Notes (Signed)
[facility]  Patient Status: Active (receiving services up to time of hospitalization)  [facility] is providing the following services: RN  If patient discharges after hours, please call [phone].   Sherryll Burger 11/10/2014, 9:40 AM

## 2014-11-10 NOTE — [facility] Group Notes (Signed)
[facility] LCSW Group Therapy  11/10/2014 2:30 PM  Type of Therapy:  Group Therapy  Participation Level:  Did Not Attend  Modes of Intervention:  Discussion, Education, Socialization and Support  Summary of Progress/Problems: Balance in life: Patients will discuss the concept of balance and how it looks and feels to be unbalanced. Pt will identify areas in their life that is unbalanced and ways to become more balanced.    Seila Liston L Darshawn Boateng MSW, LCSWA  11/10/2014, 2:30 PM   

## 2014-11-10 NOTE — Progress Notes (Signed)
Constructive, engaging, visible and interacting well with peers; no behavioral problems.

## 2014-11-11 LAB — GLUCOSE, CAPILLARY: GLUCOSE-CAPILLARY: 162 mg/dL — AB (ref 65–99)

## 2014-11-11 NOTE — Progress Notes (Signed)
D: Pt denies SI/HI/AV. Pt is pleasant and cooperative. Pt goal for today is to attend group session and plan for discharge. A: Pt was offered support and encouragement. Pt was given scheduled medications. Pt was encourage to attend groups. Q 15 minute checks were done for safety.  R:Pt attends groups and interacts well with peers and staff. Pt is taking medication. Pt has no complaints.Pt receptive to treatment and safety maintained on unit.

## 2014-11-11 NOTE — Discharge Summary (Signed)
Physician Discharge Summary Note  Patient:  [name] [name]. is an 54 y.o., male MRN:  [id number] DOB:  15-Jan-1961 Patient phone:  [phone] (home)  Patient address:   [address] [place] [address],  Total Time spent with patient: 30 minutes  Date of Admission:  11/06/2014 Date of Discharge: 11/11/14  Reason for Admission:  Suicidality  Principal Problem: Major depressive disorder, recurrent severe without psychotic features [facility]) Discharge Diagnoses: Patient Active Problem List   Diagnosis Date Noted  . Major depressive disorder, recurrent severe without psychotic features ([place]) [F33.2] 11/07/2014  . Tobacco use disorder [F17.200] 11/07/2014  . Stimulant use disorder ([place]) crack cocaine [F15.90] 11/07/2014  . Alcohol use disorder, moderate, dependence ([employer]) [F10.20] 11/07/2014  . CAD (coronary artery disease) [I25.10] 11/05/2014  . CVA (cerebral infarction) [I63.9] 10/19/2014  . LVH (left ventricular hypertrophy) [I51.7] 05/20/2013  . Obesity (BMI 30-39.9) [E66.9] 04/10/2013  . Aortic stenosis, mild [I35.0] 03/07/2013  . DM (diabetes mellitus), type 2, uncontrolled ([place]) [E11.65] 01/05/2013  . Essential hypertension, malignant [I10] 01/25/2009    Musculoskeletal: Strength & Muscle Tone: within normal limits Gait & Station: normal Patient leans: N/A  Psychiatric Specialty Exam: Physical Exam  Constitutional: He is oriented to person, place, and time. He appears well-developed and well-nourished.  HENT:  Head: Normocephalic and atraumatic.  Eyes: Conjunctivae and EOM are normal.  Neck: Normal range of motion.  Respiratory: Effort normal.  Musculoskeletal: Normal range of motion.  Neurological: He is alert and oriented to person, place, and time.  Skin: Skin is warm and dry.  Psychiatric: He has a normal mood and affect. His behavior is normal. Judgment and thought content normal.    Review of Systems  Constitutional: Negative.   HENT: Negative.    Eyes: Negative.   Respiratory: Negative.   Cardiovascular: Negative.   Gastrointestinal: Negative.   Genitourinary: Negative.   Musculoskeletal: Negative.   Skin: Negative.   Neurological: Negative.   Endo/Heme/Allergies: Negative.   Psychiatric/Behavioral: Negative.     Blood pressure 160/100, pulse 88, temperature 97.6 F (36.4 C), temperature source Oral, resp. rate 20, height $RemoveBe'5\' 11"'HbxOMvDtA$  (1.803 m), weight 109.317 kg (241 lb), SpO2 98 %.Body mass index is 33.63 kg/(m^2).  General Appearance: Fairly Groomed  Engineer, water::  Good  Speech:  Slurred  Volume:  Normal  Mood:  Anxious  Affect:  Appropriate and Congruent  Thought Process:  Linear  Orientation:  Full (Time, Place, and Person)  Thought Content:  Hallucinations: None  Suicidal Thoughts:  No  Homicidal Thoughts:  No  Memory:  Immediate;   Good Recent;   Good Remote;   Good  Judgement:  Fair  Insight:  Fair  Psychomotor Activity:  Normal  Concentration:  NA  Recall:  NA  Fund of Knowledge:Fair  Language: Fair  Akathisia:  No  Handed:    AIMS (if indicated):     Assets:  Financial Resources/Insurance  ADL's:  Intact  Cognition: WNL  Sleep:  Number of Hours: 6   History of Present Illness:  [name] [name] is a single 54 year old African-American male from [place]. He brought himself to the hospital on Friday, September 30 due to chest pain. The patient is states that he has been using cocaine which he was mixing with Viagra (which is not prescribed to him and gets on the streets). The patient states that he kept using despite having chest pain because he just didn't care to die anymore. Patient states that he has been having depressed  mood and crying spells for about a year. The symptoms started after he had a stroke. He also reports poor appetite, energy and concentration. The patient frequently has thoughts of wanting to die as he feels he cannot do anything for himself anymore. He complains of feeling  hopeless and helpless. He reports that since his stroke he has very difficult time with memory, organization and concentration. He frequently forgets things or leaves things on the stove on. His gait is unsteady and he has been using a cane, he reports having multiple falls at home even with loss of consciousness. Patient has a home health aid that comes and helps him with his pillbox but does still only assistance he is receiving. He is states that [facility] has scheduled him for an assessment for community support services. Currently he is not seeing a psychiatrist or receiving any treatment for substance abuse.  Patient has been in our hospital with different physical complaints 6 times since March. Patient was initially admitted to the medical floor after his presentation with chest pain. When he was told he was going to be discharged 24 hours later the patient started voicing suicidality.  Substance abuse history: Patient smokes about one pack of cigarettes per day. He has been using crack for about 6 years, states he uses about once every 3 months. He has been drinking alcohol for 20 years he binges every 2 weeks. During his being in episodes he can drink up to a fifth of alcohol +1 or 2 40 ounces beers. Patient states that he was hospitalized in our facility for substance abuse back in the 90s No other history of substance abuse treatment.  Symptoms: Depression: Anhedonia, poor appetite poor concentration, hopelessness helplessness suicidality, depressed mood. Psychosis: Denies auditory or visual hallucinations Bipolar disorder: Denies symptoms consistent with mania or hypomania  Total Time spent with patient: 1 hour  Past Psychiatric History: Patient reports one prior suicidal attempt in the night is when he drank rat poison. He reports having 2 prior hospitalizations in our facility back in the 90s. He believes he was diagnosed with depression and schizophrenia. He denies receiving any prior  treatment for substance abuse. Denies any history of self-injurious behaviors.  Risk to Self: Is patient at risk for suicide?: Yes Risk to Others:   no Prior Inpatient Therapy:   yes Prior Outpatient Therapy:   no  Alcohol Screening: Patient refused Alcohol Screening Tool: (no) 1. How often do you have a drink containing alcohol?: 2 to 4 times a month 2. How many drinks containing alcohol do you have on a typical day when you are drinking?: 3 or 4 3. How often do you have six or more drinks on one occasion?: Never Preliminary Score: 1 4. How often during the last year have you found that you were not able to stop drinking once you had started?: Never 5. How often during the last year have you failed to do what was normally expected from you becasue of drinking?: Never 6. How often during the last year have you needed a first drink in the morning to get yourself going after a heavy drinking session?: Never 7. How often during the last year have you had a feeling of guilt of remorse after drinking?: Never 8. How often during the last year have you been unable to remember what happened the night before because you had been drinking?: Never 9. Have you or someone else been injured as a result of your drinking?: No 10. Has  a relative or friend or a doctor or another health worker been concerned about your drinking or suggested you cut down?: No Alcohol Use Disorder Identification Test Final Score (AUDIT): 3 Brief Intervention: AUDIT score less than 7 or less-screening does not suggest unhealthy drinking-brief intervention not indicated Substance Abuse History in the last 12 months: Yes.    Past Medical History: Patient suffers from hypertension, diabetes. He had an cerebrovascular accident about a year ago. He follows up with [facility]. He denies any history of surgeries. States that he has been compliant with his medications prior to admission. Denies any history of seizures.  He does report a history of head trauma at home as he has fallen frequently and at times have lost consciousness.  Seen by cardiology while on the medical floor: 1) chest pain Atypical in nature, likely secondary to cocaine Cardiac enzymes negative, EKG unchanged from prior studies Cardiac catheterization last year given his history of chronic chest pain with minimal coronary artery disease of a ramus branch.  2) malignant hypertension Patient compliance in question Blood pressure has improved on outpatient medication regimen He does report that he has his medications at home Would continue current regimen, monitor --If blood pressure continues to run high, could add hydralazine  3) diabetes type 2, poorly controlled Needs outpatient referral to endocrine or primary care Suspect that patient compliance is an issue  5) history of stroke Likely secondary to chronic drug abuse  Unable to exclude PFO Would continue low-dose aspirin  6) LVH Previously was noted to be significant, Likely secondary to chronic hypertension.  Past Medical History  Diagnosis Date  . Hypertension   . Diabetes mellitus   . GERD (gastroesophageal reflux disease)   . Pericarditis   . Polysubstance abuse     cocaine, marijuana  . Auditory hallucinations   . Depression   . Hx of medication noncompliance   . History of suicide attempt   . Stroke [facility]) February 2015    Right MCA aneurysm; head MRI - acute infarct of the right frontal lobe. Smaller acute infarct of the left frontal lobe. Thought to be due to cerebral emboli. Left PICA infarct. Small chronic lacunar infarct right internal capsule. Chronic microvascular ischemic change with white matter change.  . Coronary artery disease     No reported catheterization. Negative Myoview stress test and 2013.  Marland Kitchen Syncope and collapse   . Bipolar disorder ([place])   . Obesity, Class II, BMI 35-39.9, with  comorbidity ([place])   . Hypertensive hypertrophic cardiomyopathy [facility]) February 2015    Severe concentric LVH on echocardiogram.  . Aortic stenosis, mild February 2015    Past Surgical History  Procedure Laterality Date  . Pericardial fluid drainage    . Transthoracic echocardiogram  03/23/2013    EF 5560%. Elevated left atrial pressure. Severe concentric LVH. Mild aortic stenosis. -- Appearance of hypertensive hypertrophic heart disease. No clear cardioembolic source  . Nm myoview ltd  July 2013    No ischemia or infarct. There is inferior wall defec suggestive of diaphragmatic attenuation. Mild LV dilation at to be due to hypertensive heart disease. EF 55%. Poor exercise tolerance.  . Carotid dopplers:  03/23/2013    Interval thickening of bilateral common carotid no significant plaque formation. Tortuosity but no significant stenosis. Patent bilateral vertebral arteries   Family History:  Family History  Problem Relation Age of Onset  . Stroke Mother   . Diabetes type II Mother   . Hypertension Mother   .  Hypertension Father   . Arthritis Father   . Arthritis Sister   . Diabetes type II Sister   . Hypertension Sister    Family Psychiatric History: Patient reports that his father was an alcoholic. There is no history of suicides in his family. He thinks one of his cousins suffers from bipolar or schizophrenia  Social History: Patient is single, never married and has 3 children ages 54, 41 and 48 year old but he is not close to any of them. The patient has been receiving SSI for the last 8 years. He also has Medicaid. In the past he used to work as a Aeronautical engineer. He was placed on SSI due to his mental illness and medical problems. He has a 10th grade education. As far as his legal history he reports that he had raped charges in the 40s. He used to be a registered sex offender but states that he was then removed  from the list as this was allowed back then. Patient also had prior charges for DWIs. History  Alcohol Use  . 25.8 oz/week  . 3 Glasses of wine, 40 Cans of beer per week    Comment: occasionally; reports 1 pint of vodka per month at paydaysocially    History  Drug Use  . 1.00 per week  . Special: Marijuana, Cocaine    Comment: past    Social History   Social History  . Marital Status: Single    Spouse Name: N/A  . Number of Children: N/A  . Years of Education: N/A   Social History Main Topics  . Smoking status: Current Every Day Smoker -- 0.50 packs/day for 15 years    Types: Cigars  . Smokeless tobacco: None  . Alcohol Use: 25.8 oz/week    3 Glasses of wine, 40 Cans of beer per week     Comment: occasionally; reports 1 pint of vodka per month at paydaysocially  . Drug Use: 1.00 per week    Special: Marijuana, Cocaine     Comment: past  . Sexual Activity: Yes   Other Topics Concern  . None   Social History Narrative   Single with no children. No exercise.   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   Has a history of polysubstance abuse including cocaine. No she she urine toxicology screen in the hospital was positive. He also current smoker.   He is seen by behavioral health for bipolar disorder.          Hospital Course:   54 year old single African-American male with a multitude of chronic medical conditions (diabetes, hypertension, cerebrovascular disease) along with history of substance abuse and depression. Patient presents to our emergency department with uncontrollable hypertension and chest pain in the setting of increasing use of Viagra and crack.   Major depressive disorder: Patient reports history of prior psychiatric treatment in the 90s. He reports worsening of mood and passive suicidal thoughts since he had a stroke  and year ago. Patient was prescribed with Wellbutrin and BuSpar. Wellbutrin and BuSpar were discontinued on October 3 and he was restarted on sertraline. Continue sertraline 50 mg q day  Anxiety: Patient will be continued on Vistaril 25 mg every 8 hours as needed.  Insomnia: Continue the patient on trazodone 150 mg by mouth daily at bedtime. Patient reports poor sleep but per nursing staff patient has been sleeping between 6 and 7 hours every night.  Alcohol use disorder-cocaine use disorder: Patient will  be referred for outpatient substance abuse treatment once discharged. No evidence of alcohol withdrawal during his stay in the hospital  Diabetes: Continue  NovoLog 10 units twice a day with meals and metformin 500 mg by mouth twice a day. CBS is well controlled.  Hypertension: Continue chlorthalidone 25 mg a day, Norvasc 10 mg a day, lisinopril 40 mg a day and clonidine 0.2 mg bid. Blood pressure much improved  Hyperlipidemia: Patient will be continued on Lipitor 20 mg daily  Cardiovascular disease and cerebrovascular disease: Continue aspirin 81 mg and Plavix 75 mg a day  GERD: Continue the patient on Protonix 40 mg a day  Tobacco use disorder: Patient will be started on a nicotine patch 21 mg a day  Unsteady gait: Physical therapy evaluated the patient. Physical therapy recommended for the patient to continue physical therapy after discharge. During his stay in the unit the patient was uncooperative with physical therapy.  Discharge disposition: Patient is requesting assistance with possible assisted living facility. Patient has been in our hospital 6 times over the last 7 months. Clearly having issues with compliance as hemoglobin A1c is about 17.  Patient will be discharged today to a assisted living facility.  On the day of the discharge the patient was hopeful, future oriented. Excited about discharge plans to a assisted living facility where he can receive medical attention.  Denied suicidality, homicidality or having auditory or visual hallucinations. Mood was improved. Affect was reactive and appropriate. There was no evidence of agitation or aggression. During his stay in the hospital the patient did not require seclusion, restraints, or forced medications. There were no behavioral problems during his estate. There were no medical complications during his estate and the psychiatric unit. Patient had minimal participation in programming and the stated withdrawn to his room.   Discharge Vitals:   Blood pressure 160/100, pulse 88, temperature 97.6 F (36.4 C), temperature source Oral, resp. rate 20, height $RemoveBe'5\' 11"'erRnnbLip$  (1.803 m), weight 109.317 kg (241 lb), SpO2 98 %. Body mass index is 33.63 kg/(m^2).  Lab Results:   Results for [name] [name], [name] [name] (MRN [id number]) as of 11/11/2014 09:14  Ref. Range 10/21/2014 05:05 11/05/2014 02:35 11/05/2014 04:44 11/05/2014 07:13 11/05/2014 11:32 11/05/2014 13:17 11/05/2014 21:35 11/06/2014 05:49  Sodium Latest Ref Range: 135-145 mmol/L  137    136    Potassium Latest Ref Range: 3.5-5.1 mmol/L  3.4 (L)    3.6    Chloride Latest Ref Range: 101-111 mmol/L  101    103    CO2 Latest Ref Range: 22-32 mmol/L  26    26    BUN Latest Ref Range: 6-20 mg/dL  20    18    Creatinine Latest Ref Range: 0.61-1.24 mg/dL  1.35 (H)    1.21    Calcium Latest Ref Range: 8.9-10.3 mg/dL  9.2    8.8 (L)    EGFR (Non-African Amer.) Latest Ref Range: >60 mL/min  58 (L)    >60    EGFR (African American) Latest Ref Range: >60 mL/min  >60    >60    Glucose Latest Ref Range: 65-99 mg/dL  117 (H)    152 (H)    Anion gap Latest Ref Range: 5-$RemoveBefo'15   10    7    'PLdBacoEXUr$ Magnesium Latest Ref Range: 1.7-2.4 mg/dL  2.0        Alkaline Phosphatase Latest Ref Range: 38-126 U/L  63        Albumin Latest Ref Range: 3.5-5.0 g/dL  4.5        AST Latest Ref Range: 15-41 U/L  23        ALT Latest Ref Range: 17-63 U/L  13 (L)        Total Protein Latest Ref Range: 6.5-8.1 g/dL  7.9        Total  Bilirubin Latest Ref Range: 0.3-1.2 mg/dL  1.1        Troponin I Latest Ref Range: <0.031 ng/mL  0.07 (H)  0.08 (H)      Cholesterol Latest Ref Range: 0-200 mg/dL 124         Triglycerides Latest Ref Range: <150 mg/dL 149         HDL Cholesterol Latest Ref Range: >40 mg/dL 22 (L)         LDL (calc) Latest Ref Range: 0-99 mg/dL 72         VLDL Latest Ref Range: 0-40 mg/dL 30         Total CHOL/HDL Ratio Latest Units: RATIO 5.6         WBC Latest Ref Range: 3.8-10.6 K/uL  17.8 (H)    14.3 (H)  8.8  RBC Latest Ref Range: 4.40-5.90 MIL/uL  5.41    5.00  4.49  Hemoglobin Latest Ref Range: 13.0-18.0 g/dL  17.3    15.7  14.4  HCT Latest Ref Range: 40.0-52.0 %  49.6    45.5  40.9  MCV Latest Ref Range: 80.0-100.0 fL  91.6    91.0  91.1  MCH Latest Ref Range: 26.0-34.0 pg  31.9    31.5  32.0  MCHC Latest Ref Range: 32.0-36.0 g/dL  34.8    34.6  35.2  RDW Latest Ref Range: 11.5-14.5 %  13.0    12.6  12.8  Platelets Latest Ref Range: 150-440 K/uL  230    203  186  Heparin Unfractionated Latest Ref Range: 0.30-0.70 IU/mL     0.23 (L)  <0.10 (L)   Prothrombin Time Latest Ref Range: 11.4-15.0 seconds  13.7        INR Unknown  1.03        APTT Latest Ref Range: 24-36 seconds  31        Amphetamines, Ur Screen Latest Ref Range: NONE DETECTED    NONE DETECTED       Barbiturates, Ur Screen Latest Ref Range: NONE DETECTED    NONE DETECTED       Benzodiazepine, Ur Scrn Latest Ref Range: NONE DETECTED    NONE DETECTED       Cocaine Metabolite,Ur [place] Latest Ref Range: NONE DETECTED    POSITIVE (A)       Methadone Scn, Ur Latest Ref Range: NONE DETECTED    NONE DETECTED       MDMA (Ecstasy)Ur Screen Latest Ref Range: NONE DETECTED    NONE DETECTED       Cannabinoid 50 Ng, Ur [place] Latest Ref Range: NONE DETECTED    NONE DETECTED       Opiate, Ur Screen Latest Ref Range: NONE DETECTED    POSITIVE (A)       Phencyclidine (PCP) Ur S Latest Ref Range: NONE DETECTED    NONE DETECTED       Tricyclic, Ur Screen Latest  Ref Range: NONE DETECTED    NONE DETECTED        Results for [name] [name], [name] [name] (MRN [id number]) as of 11/11/2014 09:14  Ref. Range 11/05/2014 02:35 11/05/2014 13:17 11/06/2014 05:49  Hemoglobin A1c  Latest  Ref Range: 13.0-18.0 g/dL 17.3 15.7 14.4   ------------------------------------------------------------------- Transthoracic Echocardiography  Patient:  [name] [name], [name] [name] MR #:    [id number] Study Date: 11/05/2014 Gender:   M Age:    68 Height:   180.3 cm Weight:   113.4 kg BSA:    2.42 m^2 Pt. Status: Room:    242A  ADMITTING  Elio Forget N SONOGRAPHER [place] [facility]ills RDCS ATTENDING  Hinda Kehr PERFORMING  [facility]  cc:  ------------------------------------------------------------------- LV EF: 50% -  55%  ------------------------------------------------------------------- Indications:   Chest Pain R07.9.  ------------------------------------------------------------------- Study Conclusions  - Left ventricle: The cavity size was mildly dilated. There was moderate concentric hypertrophy. Systolic function was normal. The estimated ejection fraction was in the range of 50% to 55%. Wall motion was normal; there were no regional wall motion abnormalities. Doppler parameters are consistent with abnormal left ventricular relaxation (grade 1 diastolic dysfunction). - Mitral valve: There was mild regurgitation. - Left atrium: The atrium was mild to moderately dilated. - Right ventricle: Systolic function was normal. - Pulmonary arteries: Systolic pressure was within the normal range.  Discharge Instructions    Diet - low sodium heart healthy    Complete by:  As directed             Medication List    STOP taking these medications        naproxen 250 MG tablet  Commonly known as:  NAPROSYN     polyethylene glycol packet  Commonly known as:  MIRALAX / GLYCOLAX      TAKE these  medications      Indication   amLODipine 10 MG tablet  Commonly known as:  NORVASC  Take 1 tablet (10 mg total) by mouth daily.  Notes to Patient:  Blood pressure      aspirin 81 MG EC tablet  Take 1 tablet (81 mg total) by mouth daily.  Notes to Patient:  Cardiovascular health      atorvastatin 20 MG tablet  Commonly known as:  LIPITOR  Take 1 tablet (20 mg total) by mouth daily at 6 PM.  Notes to Patient:  Cholesterol      chlorthalidone 25 MG tablet  Commonly known as:  HYGROTON  Take 1 tablet (25 mg total) by mouth daily.  Notes to Patient:  Blood pressure      cloNIDine 0.2 MG tablet  Commonly known as:  CATAPRES  Take 1 tablet (0.2 mg total) by mouth 2 (two) times daily.  Notes to Patient:  Blood pressure      clopidogrel 75 MG tablet  Commonly known as:  PLAVIX  Take 1 tablet (75 mg total) by mouth daily.  Notes to Patient:  Cerebrovascular disease      hydrOXYzine 25 MG tablet  Commonly known as:  ATARAX/VISTARIL  Take 1 tablet (25 mg total) by mouth 3 (three) times daily as needed (anxiety).  Notes to Patient:  Anxiety      insulin aspart 100 UNIT/ML injection  Commonly known as:  novoLOG  Inject 10 Units into the skin 2 (two) times daily with a meal.  Notes to Patient:  Diabetes      lisinopril 40 MG tablet  Commonly known as:  PRINIVIL,ZESTRIL  Take 1 tablet (40 mg total) by mouth daily.  Notes to Patient:  Blood pressure      metFORMIN 500 MG tablet  Commonly known as:  GLUCOPHAGE  Take 1 tablet (500 mg total) by mouth 2 (  two) times daily before a meal.  Notes to Patient:  Diabetes      pantoprazole 40 MG tablet  Commonly known as:  PROTONIX  Take 1 tablet (40 mg total) by mouth daily.  Notes to Patient:  GERD      sertraline 100 MG tablet  Commonly known as:  ZOLOFT  Take 0.5 tablets (50 mg total) by mouth daily.  Notes to Patient:  Depression      traZODone 150 MG tablet  Commonly known as:  DESYREL  Take 1 tablet (150 mg total) by mouth at  bedtime.  Notes to Patient:  Insomnia           Total Discharge Time: 30 minutes  Signed: Hildred Priest 11/11/2014, 9:16 AM

## 2014-11-11 NOTE — BHH Counselor (Signed)
Adult Comprehensive Assessment  Patient ID: Kache Mcclurg., male   DOB: 11-14-1960, 54 y.o.   MRN: [id number]  Information Source: Information source: Patient  Current Stressors:     Living/Environment/Situation:  Living Arrangements: Alone  Family History:  Marital status: Single  Childhood History:     Education:  Currently a Consulting civil engineer?: No Name of school: na Learning disability?: Yes  Employment/Work Situation:   Employment situation: On disability Why is patient on disability: metnal illness Patient's job has been impacted by current illness: Yes Has patient ever been in the [employer]?: No Has patient ever served in Buyer, retail?: No  Financial Resources:      Alcohol/Substance Abuse:   Alcohol/Substance Abuse Treatment Hx: Past Tx, Outpatient  Social Support System:   Patient's Community Support System: Poor Describe Community Support System: patient is connected to [employer]  Leisure/Recreation:      Strengths/Needs:      Discharge Plan:   Does patient have access to transportation?: Yes Will patient be returning to same living situation after discharge?: No Plan for living situation after discharge: Patient wants a group home Currently receiving community mental health services: Yes (From Whom) ([facility]) Does patient have financial barriers related to discharge medications?: No  Summary/Recommendations:  Pt is a 54 yo AA male admitted for SI and crack use. Patient does not want to return to his [address] apartment but wants placement. [facility] is requested and patient will be palaced at [facility] group [facility] [phone]. Patient receives $734 monthly and has Medicaid. Pateint refuses family contact but wants to stop using crack. Patientis encouraged to particiapte in group therapy, med management, and therapeutic milieu.     Lulu Riding., MSW, Theresia Majors  11/11/2014

## 2014-11-11 NOTE — BHH Group Notes (Signed)
[facility] LCSW Group Therapy  11/11/2014 2:55 PM  Type of Therapy:  Group Therapy  Participation Level:  Did Not Attend    Lulu Riding, MSW, LCSWA 11/11/2014, 2:55 PM

## 2014-11-11 NOTE — Progress Notes (Signed)
  [facility] Adult Case Management Discharge Plan :  Will you be returning to the same living situation after discharge:  No. Patient will discharge to group home [facility] with [facility] [phone] and FL2 with [facility] is on chart At discharge, do you have transportation home?: Yes,  group home will pick up Do you have the ability to pay for your medications: Yes,  patient has Medicaid  Release of information consent forms completed and in the chart;  Patient's signature needed at discharge.  Patient to Follow up at: Follow-up Information    Follow up with [facility]. Go on 11/16/2014.   Why:  For follow-up care appt Wednesday 11/16/14 at 8:00am   Contact information:   [address] [place], [place] Ph [phone] Fax [phone]      Patient denies SI/HI: Yes,  patient denies SI/HI    Safety Planning and Suicide Prevention discussed: Yes,  SPE discussed with patient but patient refused family contact  Have you used any form of tobacco in the last 30 days? (Cigarettes, Smokeless Tobacco, Cigars, and/or Pipes): Yes  Has patient been referred to the Quitline?: Patient refused referral  Lulu Riding, MSW, LCSWA 11/11/2014, 2:50 PM

## 2014-11-11 NOTE — Tx Team (Signed)
Interdisciplinary Treatment Plan Update (Adult)  Date:  11/11/2014 Time Reviewed:  9:49 AM  Progress in Treatment: Attending groups: Yes. Participating in groups:  Yes. Taking medication as prescribed:  Yes. Tolerating medication:  Yes. Family/Significant othe contact made:  No, will contact:  if patient provides consent Patient understands diagnosis:  Yes. Discussing patient identified problems/goals with staff:  Yes. Medical problems stabilized or resolved:  Yes. Denies suicidal/homicidal ideation: Yes. Issues/concerns per patient self-inventory:  Yes. patient reports his is homeless and wants ALF placement Other:  New problem(s) identified: No, Describe:  none reported  Discharge Plan or Barriers: Patient will stabilize on meds for depression and SI. Patient states he is homeless and cannot return to his [address] housing. Patient is followed by [facility] CST Olegario Shearer and is awaiting [facility] and is referred to ALF for placement. Patient will need home health PT at discharge.  Reason for Continuation of Hospitalization: Depression Suicidal ideation  Comments:  Estimated length of stay: up to 1 day expected discharge Friday 11/11/14  New goal(s):  Review of initial/current patient goals per problem list:   1.  Goal(s): participate in care planning  Met:  Yes  Target date: at discharge  2.  Goal (s): placement in ALF  Met:  No  Target date: at discharge  3. Goal: decrease depression and eliminate SI  Patient no longer reports SI  Attendees: Physician:  Merlyn Albert, MD 10/7/20169:49 AM  Nursing:  Tami Lin, RN 10/7/20169:49 AM  Other:  Carmell Austria, LCSWA  10/7/20169:49 AM  Other:   10/7/20169:49 AM  Other:   10/7/20169:49 AM  Other:  10/7/20169:49 AM  Other:  10/7/20169:49 AM  Other:  10/7/20169:49 AM  Other:  10/7/20169:49 AM  Other:  10/7/20169:49 AM  Other:  10/7/20169:49 AM  Other:   10/7/20169:49 AM   Scribe for Treatment Team:   Keene Breath, MSW,  LCSWA 11/11/2014, 9:49 AM

## 2014-11-11 NOTE — Progress Notes (Signed)
Manual BP=160,100, All BP meds given per Dr. Micki Riley advice

## 2014-11-11 NOTE — Plan of Care (Signed)
Problem: Ineffective individual coping Goal: STG: Patient will remain free from self harm Outcome: Progressing Medications administered as ordered by the physician, medications Therapeutic Effects, SEs and Adverse effects discussed, questions encouraged; no PRN given, 15 minute checks maintained for safety, clinical and moral support provided, patient encouraged to continue to express feelings and demonstrate safe care. Patient remain free from harm, will continue to monitor.         

## 2014-11-11 NOTE — BHH Suicide Risk Assessment (Signed)
[facility] Discharge Suicide Risk Assessment   Demographic Factors:  Male and Low socioeconomic status  Total Time spent with patient: 30 minutes   Psychiatric Specialty Exam: Physical Exam  ROS                                                         Have you used any form of tobacco in the last 30 days? (Cigarettes, Smokeless Tobacco, Cigars, and/or Pipes): Yes  Has this patient used any form of tobacco in the last 30 days? (Cigarettes, Smokeless Tobacco, Cigars, and/or Pipes) Yes, A prescription for an FDA-approved tobacco cessation medication was offered at discharge and the patient refused  Mental Status Per Nursing Assessment::   On Admission:     Current Mental Status by Physician: Mood euthymic after reacted. Denies SI, denies HI, denies auditory or visual hallucinations. There is no evidence of psychosis. There is no evidence of agitation or anxiety. The patient appears hopeful, motivated for treatment and future oriented  Loss Factors: Decline in physical health  Historical Factors: Impulsivity  Risk Reduction Factors:   NA  Continued Clinical Symptoms:  Depression:   Comorbid alcohol abuse/dependence Alcohol/Substance Abuse/Dependencies Medical Diagnoses and Treatments/Surgeries  Cognitive Features That Contribute To Risk:  Closed-mindedness    Suicide Risk:  Minimal: No identifiable suicidal ideation.  Patients presenting with no risk factors but with morbid ruminations; may be classified as minimal risk based on the severity of the depressive symptoms  Principal Problem: Major depressive disorder, recurrent severe without psychotic features [facility]) Discharge Diagnoses:  Patient Active Problem List   Diagnosis Date Noted  . Major depressive disorder, recurrent severe without psychotic features (HCC) [F33.2] 11/07/2014  . Tobacco use disorder [F17.200] 11/07/2014  . Stimulant use disorder (HCC) crack cocaine [F15.90] 11/07/2014  . Alcohol  use disorder, moderate, dependence (HCC) [F10.20] 11/07/2014  . CAD (coronary artery disease) [I25.10] 11/05/2014  . CVA (cerebral infarction) [I63.9] 10/19/2014  . LVH (left ventricular hypertrophy) [I51.7] 05/20/2013  . Obesity (BMI 30-39.9) [E66.9] 04/10/2013  . Aortic stenosis, mild [I35.0] 03/07/2013  . DM (diabetes mellitus), type 2, uncontrolled (HCC) [E11.65] 01/05/2013  . Essential hypertension, malignant [I10] 01/25/2009      Is patient on multiple antipsychotic therapies at discharge:  No   Has Patient had three or more failed trials of antipsychotic monotherapy by history:  No  Recommended Plan for Multiple Antipsychotic Therapies: NA    Jimmy Footman 11/11/2014, 9:15 AM

## 2014-11-11 NOTE — Progress Notes (Signed)
Patient denies SI/HI, denies A/V hallucinations. Patient verbalizes understanding of discharge instructions, follow up care and prescriptions. Patient given all belongings from personal locker. Patient escorted out by staff, transported by group home. 

## 2014-11-11 NOTE — BHH Suicide Risk Assessment (Signed)
[facility] INPATIENT:  Family/Significant Other Suicide Prevention Education  Suicide Prevention Education:  Patient Refusal for Family/Significant Other Suicide Prevention Education: The patient [name] [name]. has refused to provide written consent for family/significant other to be provided Family/Significant Other Suicide Prevention Education during admission and/or prior to discharge.  Physician notified.  Lulu Riding, MSW, LCSWA 11/11/2014, 2:49 PM

## 2014-11-11 NOTE — B[facility] Group Notes (Signed)
[facility] LCSW Aftercare Discharge Planning Group Note   11/11/2014 1:43 PM  Participation Quality:  Did not attend   Anyia Gierke L Tamika Nou MSW, LCSWA  

## 2014-11-12 NOTE — Discharge Summary (Signed)
[facility] [place] [facility]   PATIENT NAME: [name] [name]    MR#:  [id number]  DATE OF BIRTH:  03/07/1960  DATE OF ADMISSION:  11/05/2014 ADMITTING PHYSICIAN: Crissie Figures, MD  DATE OF DISCHARGE: 11/06/2014  6:31 PM  PRIMARY CARE PHYSICIAN: Preston Fleeting, MD    ADMISSION DIAGNOSIS:  Cocaine abuse [F14.10] NSTEMI (non-ST elevated myocardial infarction) (HCC) [I21.4] Secondary hypertension, unspecified [I15.9]  DISCHARGE DIAGNOSIS:  Active Problems:   Essential hypertension, malignant   CAD (coronary artery disease)   SECONDARY DIAGNOSIS:   Past Medical History  Diagnosis Date  . Hypertension   . Diabetes mellitus   . GERD (gastroesophageal reflux disease)   . Pericarditis   . Polysubstance abuse     cocaine, marijuana  . Auditory hallucinations   . Depression   . Hx of medication noncompliance   . History of suicide attempt   . Stroke [facility]) February 2015    Right MCA aneurysm; head MRI - acute infarct of the right frontal lobe. Smaller acute infarct of the left frontal lobe. Thought to be due to cerebral emboli. Left PICA infarct. Small chronic lacunar infarct right internal capsule. Chronic microvascular ischemic change with white matter change.  . Coronary artery disease     No reported catheterization. Negative Myoview stress test and 2013.  Marland Kitchen Syncope and collapse   . Bipolar disorder (HCC)   . Obesity, Class II, BMI 35-39.9, with comorbidity (HCC)   . Hypertensive hypertrophic cardiomyopathy [facility]) February 2015    Severe concentric LVH on echocardiogram.  . Aortic stenosis, mild February 2015     ADMITTING HISTORY  [name] [name] is a 54 y.o. male with a known history of hypertension, diabetes mellitus type 2, CVA, TIA- 9 /2016, history of polysubstance abuse, coronary artery disease, bipolar disorder, GERD presents to the emergency room with the complaints of acute onset of chest pain. Patient states he used 1  tablet of Viagra and also smoked cocaine around 5 PM yesterday and after that he developed retrosternal chest pain at night. Associated shortness of breath and diaphoresis +. On arrival to the ED patient was having ongoing chest pain and was also noted to have elevated blood pressure of 180/130's. Workup revealed elevated troponin of 0.07, wbc is 17.8, potassium 3.1, creatinine 1.35. Chest x-ray negative for acute cardio pulmonary pathology. EKG sinus tachycardia with ventricular rate of 90 bpm, LAD, T-wave abnormality. Patient was given aspirin, morphine and Ativan and was also started on heparin drip. Hospitalist service was consulted for further management. At the current time patient is resting in the bed and states his chest pain is getting under control. Patient denies any history of recent fever, cough, nausea, vomiting, diarrhea, abdominal pain, dysuria. Denies any focal weakness or numbness.   HOSPITAL COURSE:   54 y.o. male with h/o hypertension, poorly controlled diabetes with hemoglobin A1c greater than 10, CVAs, polysubstance abuse with cocaine, bipolar disorder, hyperlipidemia, obesity, cardiac catheterization in 2015 with mild ostial ramus disease , as well as hospitalizations for severe hypertension,   Cardiology was consult in for angina.  1) chest pain Atypical in nature, likely secondary to cocaine Cardiac enzymes negative, EKG unchanged from prior studies Cardiac catheterization last year given his history of chronic chest pain with minimal coronary artery disease of a ramus branch. Currently pain-free --- No further testing needed at this time. -- discontinue heparin infusion  Troponin normal.  2) malignant hypertension Patient compliance in question Blood pressure has improved on  outpatient medication regimen He does report that he has his medications at home Would continue current regimen, monitor --If blood pressure continues to run high, could add hydralazine  3)  drug abuse/cocaine abuse Chronic issue, reports using cocaine every 2 weeks Counseling provided Needs outpatient program  4) diabetes type 2, poorly controlled Needs outpatient referral to endocrine or primary care Suspect that patient compliance is an issue  5) history of stroke Likely secondary to chronic drug abuse  Would continue low-dose aspirin  6) LVH Previously was noted to be significant, Likely secondary to chronic hypertension.  7) suicidal ideation with depression In by psychiatry Dr. Guss Bunde. Due to his cocaine abuse and depression. Patient was discharged to inpatient psychiatry unit.  Medically stable for transfer to behavioral health unit.    CONSULTS OBTAINED:  Treatment Team:  Antonieta Iba, MD Beau Fanny, MD  DRUG ALLERGIES:  No Known Allergies  DISCHARGE MEDICATIONS:   Discharge Medication List as of 11/06/2014  5:19 PM    CONTINUE these medications which have NOT CHANGED   Details  amLODipine (NORVASC) 10 MG tablet Take 10 mg by mouth daily., Until Discontinued, Historical Med    aspirin EC 81 MG EC tablet Take 1 tablet (81 mg total) by mouth daily., Starting 07/21/2014, Until Discontinued, Normal    atorvastatin (LIPITOR) 20 MG tablet Take 1 tablet (20 mg total) by mouth daily at 6 PM., Starting 10/21/2014, Until Discontinued, Normal    buPROPion (WELLBUTRIN XL) 150 MG 24 hr tablet Take 150 mg by mouth daily., Until Discontinued, Historical Med    busPIRone (BUSPAR) 10 MG tablet Take 10 mg by mouth 2 (two) times daily., Until Discontinued, Historical Med    chlorthalidone (HYGROTON) 25 MG tablet Take 1 tablet (25 mg total) by mouth daily., Starting 07/25/2014, Until Discontinued, Print    cloNIDine (CATAPRES) 0.2 MG tablet Take 0.2 mg by mouth 2 (two) times daily. , Until Discontinued, Historical Med    clopidogrel (PLAVIX) 75 MG tablet Take 1 tablet (75 mg total) by mouth daily., Starting 07/21/2014, Until Discontinued, Normal     diclofenac sodium (VOLTAREN) 1 % GEL Apply 2 g topically 4 (four) times daily., Until Discontinued, Historical Med    insulin aspart (NOVOLOG) 100 UNIT/ML injection Inject 10 Units into the skin 2 (two) times daily with a meal., Until Discontinued, Historical Med    lisinopril (PRINIVIL,ZESTRIL) 40 MG tablet Take 40 mg by mouth daily., Until Discontinued, Historical Med    metFORMIN (GLUCOPHAGE) 500 MG tablet Take 500 mg by mouth 2 (two) times daily., Until Discontinued, Historical Med    pantoprazole (PROTONIX) 40 MG tablet Take 40 mg by mouth daily., Until Discontinued, Historical Med    traZODone (DESYREL) 100 MG tablet Take 1 tablet (100 mg total) by mouth at bedtime., Starting 10/21/2014, Until Discontinued, Normal         Today    VITAL SIGNS:  Blood pressure 107/63, pulse 69, temperature 98.3 F (36.8 C), temperature source Oral, resp. rate 20, height 5\' 11"  (1.803 m), weight 108.228 kg (238 lb 9.6 oz), SpO2 97 %.  I/O:  No intake or output data in the 24 hours ending 11/12/14 1244  PHYSICAL EXAMINATION:  Physical Exam  GENERAL:  54 y.o.-year-old patient lying in the bed with no acute distress.  LUNGS: Normal breath sounds bilaterally, no wheezing, rales,rhonchi or crepitation. No use of accessory muscles of respiration.  CARDIOVASCULAR: S1, S2 normal. No murmurs, rubs, or gallops.  ABDOMEN: Soft, non-tender, non-distended. Bowel sounds  present. No organomegaly or mass.  NEUROLOGIC: Moves all 4 extremities. PSYCHIATRIC: The patient is alert and oriented x 3.  SKIN: No obvious rash, lesion, or ulcer.   DATA REVIEW:   CBC  Recent Labs Lab 11/06/14 0549  WBC 8.8  HGB 14.4  HCT 40.9  PLT 186    Chemistries   Recent Labs Lab 11/05/14 1317  NA 136  K 3.6  CL 103  CO2 26  GLUCOSE 152*  BUN 18  CREATININE 1.21  CALCIUM 8.8*    Cardiac Enzymes No results for input(s): TROPONINI in the last 168 hours.  Microbiology Results  Results for orders placed  or performed during the hospital encounter of 01/04/13  MRSA PCR Screening     Status: None   Collection Time: 01/05/13  3:22 AM  Result Value Ref Range Status   MRSA by PCR NEGATIVE NEGATIVE Final    Comment:        The GeneXpert MRSA Assay (FDA approved for NASAL specimens only), is one component of a comprehensive MRSA colonization surveillance program. It is not intended to diagnose MRSA infection nor to guide or monitor treatment for MRSA infections.    RADIOLOGY:  No results found.    Follow up with PCP in 1 week.  Management plans discussed with the patient, family and they are in agreement.  CODE STATUS:   TOTAL TIME TAKING CARE OF THIS PATIENT ON DAY OF DISCHARGE: more than 30 minutes.    Milagros Loll R M.D on 11/12/2014 at 12:44 PM  Between 7am to 6pm - Pager - [phone]  After 6pm go to [email or web address] - password [email or web address] [facility]  [place] [facility]  Office  [phone]  CC: Primary care physician; Preston Fleeting, MD     Note: This dictation was prepared with Dragon dictation along with smaller phrase technology. Any transcriptional errors that result from this process are unintentional.

## 2014-11-15 LAB — GLUCOSE, CAPILLARY: Glucose-Capillary: 154 mg/dL — ABNORMAL HIGH (ref 65–99)

## 2014-12-13 ENCOUNTER — Emergency Department: Payer: Medicaid Other

## 2014-12-13 ENCOUNTER — Emergency Department
Admission: EM | Admit: 2014-12-13 | Discharge: 2014-12-13 | Disposition: A | Discharge status: Home / Self Care | Payer: Medicaid Other | Attending: Emergency Medicine | Admitting: Emergency Medicine

## 2014-12-13 DIAGNOSIS — Z7982 Long term (current) use of aspirin: Secondary | ICD-10-CM | POA: Diagnosis not present

## 2014-12-13 DIAGNOSIS — Y92002 Bathroom of unspecified non-institutional (private) residence single-family (private) house as the place of occurrence of the external cause: Secondary | ICD-10-CM | POA: Diagnosis not present

## 2014-12-13 DIAGNOSIS — Z794 Long term (current) use of insulin: Secondary | ICD-10-CM | POA: Insufficient documentation

## 2014-12-13 DIAGNOSIS — S3992XA Unspecified injury of lower back, initial encounter: Secondary | ICD-10-CM | POA: Diagnosis not present

## 2014-12-13 DIAGNOSIS — S0081XA Abrasion of other part of head, initial encounter: Secondary | ICD-10-CM | POA: Insufficient documentation

## 2014-12-13 DIAGNOSIS — I10 Essential (primary) hypertension: Secondary | ICD-10-CM | POA: Diagnosis not present

## 2014-12-13 DIAGNOSIS — W19XXXA Unspecified fall, initial encounter: Secondary | ICD-10-CM

## 2014-12-13 DIAGNOSIS — S0992XA Unspecified injury of nose, initial encounter: Secondary | ICD-10-CM | POA: Diagnosis present

## 2014-12-13 DIAGNOSIS — Z79899 Other long term (current) drug therapy: Secondary | ICD-10-CM | POA: Diagnosis not present

## 2014-12-13 DIAGNOSIS — E119 Type 2 diabetes mellitus without complications: Secondary | ICD-10-CM | POA: Insufficient documentation

## 2014-12-13 DIAGNOSIS — Y998 Other external cause status: Secondary | ICD-10-CM | POA: Insufficient documentation

## 2014-12-13 DIAGNOSIS — Z720 Tobacco use: Secondary | ICD-10-CM | POA: Diagnosis not present

## 2014-12-13 DIAGNOSIS — Y9389 Activity, other specified: Secondary | ICD-10-CM | POA: Insufficient documentation

## 2014-12-13 DIAGNOSIS — W01198A Fall on same level from slipping, tripping and stumbling with subsequent striking against other object, initial encounter: Secondary | ICD-10-CM | POA: Insufficient documentation

## 2014-12-13 DIAGNOSIS — S022XXA Fracture of nasal bones, initial encounter for closed fracture: Secondary | ICD-10-CM

## 2014-12-13 MED ORDER — HYDROCODONE-ACETAMINOPHEN 5-325 MG PO TABS: 1.0000 | ORAL_TABLET | ORAL | Status: DC | PRN

## 2014-12-13 MED ORDER — HYDROCODONE-ACETAMINOPHEN 5-325 MG PO TABS
2.0000 | ORAL_TABLET | Freq: Once | ORAL | Status: AC
  Administered 2014-12-13: 2 via ORAL
  Filled 2014-12-13: qty 2

## 2014-12-13 NOTE — Discharge Instructions (Signed)
Nasal Fracture A nasal fracture is a break or crack in the bones or cartilage of the nose. Minor breaks do not require treatment. These breaks usually heal on their own after about one month. Serious breaks may require surgery. CAUSES This injury is usually caused by a blunt injury to the nose. This type of injury often occurs from:  Contact sports.  Car accidents.  Falls.  Getting punched. SYMPTOMS Symptoms of this injury include:  Pain.  Swelling of the nose.  Bleeding from the nose.  Bruising around the nose or eyes. This may include having black eyes.  Crooked appearance of the nose. DIAGNOSIS This injury may be diagnosed with a physical exam. The health care provider will gently feel the nose for signs of broken bones. He or she will look inside the nostrils to make sure that there is not a blood-filled swelling on the dividing wall between the nostrils (septal hematoma). X-rays of the nose may not show a nasal fracture even when one is present. In some cases, X-rays or a CT scan may be done 1-5 days after the injury. Sometimes, the health care provider will want to wait until the swelling has gone down. TREATMENT Often, minor fractures that have caused no deformity do not require treatment. More serious fractures in which bones have moved out of position may require surgery, which will take place after the swelling is gone. Surgery will stabilize and align the fracture. In some cases, a health care provider may be able to reposition the bones without surgery. This may be done in the health care provider's office after medicine is given to numb the area (local anesthetic). HOME CARE INSTRUCTIONS  If directed, apply ice to the injured area:  Put ice in a plastic bag.  Place a towel between your skin and the bag.  Leave the ice on for 20 minutes, 2-3 times per day.  Take over-the-counter and prescription medicines only as told by your health care provider.  If your nose  starts to bleed, sit in an upright position while you squeeze the soft parts of your nose against the dividing wall between your nostrils (septum) for 10 minutes.  Try to avoid blowing your nose.  Return to your normal activities as told by your health care provider. Ask your health care provider what activities are safe for you.  Avoid contact sports for 3-4 weeks or as told by your health care provider.  Keep all follow-up visits as told by your health care provider. This is important. SEEK MEDICAL CARE IF:  Your pain increases or becomes severe.  You continue to have nosebleeds.  The shape of your nose does not return to normal within 5 days.  You have pus draining out of your nose. SEEK IMMEDIATE MEDICAL CARE IF:  You have bleeding from your nose that does not stop after you pinch your nostrils closed for 20 minutes and keep ice on your nose.  You have clear fluid draining out of your nose.  You notice a grape-like swelling on the septum. This swelling is a collection of blood (hematoma) that must be drained to help prevent infection.  You have difficulty moving your eyes.  You have repeated vomiting.   This information is not intended to replace advice given to you by your health care provider. Make sure you discuss any questions you have with your health care provider.   Document Released: 01/19/2000 Document Revised: 10/12/2014 Document Reviewed: 02/28/2014 Elsevier Interactive Patient Education 2016 [employer]  

## 2014-12-13 NOTE — ED Notes (Signed)
Patient ambulatory to triage with steady gait, without difficulty or distress noted, brought in by EMS; pt reports PTA fell in shower hitting left side of face on wall; denies LOC; denies any other c/o or injuries; st initial nosebleed to left nare with some discomfort

## 2014-12-13 NOTE — ED Provider Notes (Signed)
[facility] Emergency Department Provider Note ____________________________________________  Time seen: Approximately 9:36 PM  I have reviewed the triage vital signs and the nursing notes.   HISTORY  Chief Complaint Fall   HPI [name] [name]. is a 54 y.o. male who presents after falling in the bathroom this evening and hitting the L side of his face on the edge of the bathtub. He denies LOC, changes in vision, nausea or vomiting since the fall. He does endorse a L sided HA and facial swelling. His nose was bleeding on the L side after the fall but stopped by the time he came to the ED. He has a history of multiple strokes but denies any current use of blood thinners. Is also complaining of L buttocks pain. Denies cp, SOB, abdominal pain.  Past Medical History  Diagnosis Date  . Hypertension   . Diabetes mellitus   . GERD (gastroesophageal reflux disease)   . Pericarditis   . Polysubstance abuse     cocaine, marijuana  . Auditory hallucinations   . Depression   . Hx of medication noncompliance   . History of suicide attempt   . Stroke [facility]) February 2015    Right MCA aneurysm; head MRI - acute infarct of the right frontal lobe. Smaller acute infarct of the left frontal lobe. Thought to be due to cerebral emboli. Left PICA infarct. Small chronic lacunar infarct right internal capsule. Chronic microvascular ischemic change with white matter change.  . Coronary artery disease     No reported catheterization. Negative Myoview stress test and 2013.  Marland Kitchen Syncope and collapse   . Bipolar disorder (HCC)   . Obesity, Class II, BMI 35-39.9, with comorbidity (HCC)   . Hypertensive hypertrophic cardiomyopathy [facility]) February 2015    Severe concentric LVH on echocardiogram.  . Aortic stenosis, mild February 2015    Patient Active Problem List   Diagnosis Date Noted  . Major depressive disorder, recurrent severe without psychotic features (HCC) 11/07/2014  . Tobacco  use disorder 11/07/2014  . Stimulant use disorder (HCC) crack cocaine 11/07/2014  . Alcohol use disorder, moderate, dependence (HCC) 11/07/2014  . CAD (coronary artery disease) 11/05/2014  . CVA (cerebral infarction) 10/19/2014  . LVH (left ventricular hypertrophy) 05/20/2013  . Obesity (BMI 30-39.9) 04/10/2013  . Aortic stenosis, mild 03/07/2013  . DM (diabetes mellitus), type 2, uncontrolled (HCC) 01/05/2013  . Essential hypertension, malignant 01/25/2009    Past Surgical History  Procedure Laterality Date  . Pericardial fluid drainage    . Transthoracic echocardiogram  03/23/2013    EF 5560%. Elevated left atrial pressure. Severe concentric LVH. Mild aortic stenosis. -- Appearance of hypertensive hypertrophic heart disease.  No clear cardioembolic source  . Nm myoview ltd  July 2013    No ischemia or infarct. There is inferior wall defec suggestive of diaphragmatic attenuation. Mild LV dilation at to be due to hypertensive heart disease. EF 55%. Poor exercise tolerance.  . Carotid dopplers:  03/23/2013    Interval thickening of bilateral common carotid no significant plaque formation. Tortuosity but no significant stenosis. Patent bilateral vertebral  arteries    Current Outpatient Rx  Name  Route  Sig  Dispense  Refill  . amLODipine (NORVASC) 10 MG tablet   Oral   Take 1 tablet (10 mg total) by mouth daily.   30 tablet   0   . aspirin EC 81 MG EC tablet   Oral   Take 1 tablet (81 mg total) by mouth daily.  30 tablet   0   . atorvastatin (LIPITOR) 20 MG tablet   Oral   Take 1 tablet (20 mg total) by mouth daily at 6 PM.   30 tablet   0   . chlorthalidone (HYGROTON) 25 MG tablet   Oral   Take 1 tablet (25 mg total) by mouth daily.   30 tablet   0   . cloNIDine (CATAPRES) 0.2 MG tablet   Oral   Take 1 tablet (0.2 mg total) by mouth 2 (two) times daily.   60 tablet   0   . clopidogrel (PLAVIX) 75 MG tablet   Oral   Take 1 tablet (75 mg total) by mouth  daily.   30 tablet   0   . HYDROcodone-acetaminophen (NORCO) 5-325 MG tablet   Oral   Take 1-2 tablets by mouth every 4 (four) hours as needed for moderate pain.   15 tablet   0   . hydrOXYzine (ATARAX/VISTARIL) 25 MG tablet   Oral   Take 1 tablet (25 mg total) by mouth 3 (three) times daily as needed (anxiety).   90 tablet   0   . insulin aspart (NOVOLOG) 100 UNIT/ML injection   Subcutaneous   Inject 10 Units into the skin 2 (two) times daily with a meal.   30 mL   0   . lisinopril (PRINIVIL,ZESTRIL) 40 MG tablet   Oral   Take 1 tablet (40 mg total) by mouth daily.   30 tablet   0   . metFORMIN (GLUCOPHAGE) 500 MG tablet   Oral   Take 1 tablet (500 mg total) by mouth 2 (two) times daily before a meal.   60 tablet   0   . pantoprazole (PROTONIX) 40 MG tablet   Oral   Take 1 tablet (40 mg total) by mouth daily.   30 tablet   0   . sertraline (ZOLOFT) 100 MG tablet   Oral   Take 0.5 tablets (50 mg total) by mouth daily.   30 tablet   0   . traZODone (DESYREL) 150 MG tablet   Oral   Take 1 tablet (150 mg total) by mouth at bedtime.   30 tablet   0     Allergies Review of patient's allergies indicates no known allergies.  Family History  Problem Relation Age of Onset  . Stroke Mother   . Diabetes type II Mother   . Hypertension Mother   . Hypertension Father   . Arthritis Father   . Arthritis Sister   . Diabetes type II Sister   . Hypertension Sister     Social History Social History  Substance Use Topics  . Smoking status: Current Every Day Smoker -- 0.50 packs/day for 15 years    Types: Cigars  . Smokeless tobacco: Not on file  . Alcohol Use: 25.8 oz/week    3 Glasses of wine, 40 Cans of beer per week     Comment: occasionally; reports 1 pint of vodka per month at paydaysocially    Review of Systems Constitutional: No LOC Eyes: No visual changes. ENT: Positive L sided nose bleed Cardiovascular: Denies chest pain. Respiratory: Denies  shortness of breath. Gastrointestinal: No abdominal pain.  No nausea, no vomiting.  Musculoskeletal: Positive L sided facial pain, L buttocks pain.  Skin: L sided facial swelling.  Neurological: Positive for HA. Negative for focal weakness or numbness. 10-point ROS otherwise negative.  ____________________________________________   PHYSICAL EXAM:  VITAL SIGNS: ED Triage  Vitals  Enc Vitals Group     BP 12/13/14 2052 153/106 mmHg     Pulse Rate 12/13/14 2052 80     Resp --      Temp 12/13/14 2052 98 F (36.7 C)     Temp Source 12/13/14 2052 Oral     SpO2 12/13/14 2052 95 %     Weight 12/13/14 2052 245 lb (111.131 kg)     Height 12/13/14 2052  (1.803 m)     Head Cir --      Peak Flow --      Pain Score 12/13/14 2052 10     Pain Loc --      Pain Edu? --      Excl. in GC? --    Constitutional: Alert and oriented. Well appearing and in no acute distress. Eyes: Conjunctivae are normal. PERRL. EOMI. Head: swelling to inferior and lateral orbit, small abrasion to lateral edge of L eyebrow.  Nose: R nare with minimal dried blood, L nare with significant clotted blood, unable to visualize turbinates.  Mouth/Throat: Mucous membranes are moist.   Neck: No tenderness, ROM intact  Cardiovascular: Normal rate, regular rhythm. Grossly normal heart sounds.  Good peripheral circulation. Respiratory: Normal respiratory effort.  No retractions. Lungs CTAB. Gastrointestinal: Soft and nontender. No distention.  Musculoskeletal: L buttocks tenderness, hips stable. No lower extremity tenderness nor edema.  No joint effusions. Neurologic:  Normal speech and language. No gross focal neurologic deficits are appreciated. No gait instability. Skin:  Skin is warm, dry. No rash noted. See above Psychiatric: Mood and affect are normal. Speech and behavior are normal.  RADIOLOGY  CT Maxillofacial w/o  IMPRESSION: 1. Mildly displaced left nasal bone fracture. 2. No other facial bone fractures  are identified. 3. Paranasal sinus disease. 4. Remote left cerebellar hemispheric infarction and age advanced cerebral atrophy. 5. Subcutaneous hematoma involving the left frontal and facial area. ____________________________________________   PROCEDURES  Procedure(s) performed: None  Critical Care performed: No  ____________________________________________   INITIAL IMPRESSION / ASSESSMENT AND PLAN / ED COURSE  Pertinent labs & imaging results that were available during my care of the patient were reviewed by me and considered in my medical decision making (see chart for details).  54 yo M who presents after falling in the bathroom this evening and hitting the L side of his face on the edge of the tub. Denies LOC, changes in vision, confusion or vomiting since the fall. He complained of L sided epistaxis and HA post fall, nose was not bleeding by the time he was seen and significant clot was found in the nasal passage. Swelling noted to inferior and lateral orbit with small abrasion to lateral L eyebrow. CT maxillofacial revealed mildly displace L nasal bone facture. Will discharge pt back home with hydrocodone and ibuprofen for pain and swelling. F/u with PCP if symptoms worsen. Discussed return precautions with pt and he voiced understanding.  ____________________________________________   FINAL CLINICAL IMPRESSION(S) / ED DIAGNOSES  Final diagnoses:  Fall  Nasal fracture, closed, initial encounter      Evangeline Dakin, PA-C 12/13/14 2234  Emily Filbert, MD 12/13/14 [id number]

## 2014-12-28 ENCOUNTER — Ambulatory Visit (INDEPENDENT_AMBULATORY_CARE_PROVIDER_SITE_OTHER): Payer: Medicaid Other | Admitting: Cardiovascular Disease

## 2014-12-28 ENCOUNTER — Encounter: Payer: Self-pay | Admitting: Cardiovascular Disease

## 2014-12-28 VITALS — BP 168/110 | HR 110 | Ht 71.0 in | Wt 201.5 lb

## 2014-12-28 DIAGNOSIS — I10 Essential (primary) hypertension: Secondary | ICD-10-CM

## 2014-12-28 DIAGNOSIS — E1159 Type 2 diabetes mellitus with other circulatory complications: Secondary | ICD-10-CM | POA: Diagnosis not present

## 2014-12-28 DIAGNOSIS — I25111 Atherosclerotic heart disease of native coronary artery with angina pectoris with documented spasm: Secondary | ICD-10-CM

## 2014-12-28 DIAGNOSIS — E669 Obesity, unspecified: Secondary | ICD-10-CM

## 2014-12-28 DIAGNOSIS — E1165 Type 2 diabetes mellitus with hyperglycemia: Secondary | ICD-10-CM

## 2014-12-28 DIAGNOSIS — F1590 Other stimulant use, unspecified, uncomplicated: Secondary | ICD-10-CM

## 2014-12-28 DIAGNOSIS — F1020 Alcohol dependence, uncomplicated: Secondary | ICD-10-CM

## 2014-12-28 DIAGNOSIS — I517 Cardiomegaly: Secondary | ICD-10-CM

## 2014-12-28 DIAGNOSIS — IMO0002 Reserved for concepts with insufficient information to code with codable children: Secondary | ICD-10-CM

## 2014-12-28 MED ORDER — LISINOPRIL 40 MG PO TABS: 40.0000 mg | ORAL_TABLET | Freq: Every day | ORAL | Status: DC

## 2014-12-28 MED ORDER — CLONIDINE HCL 0.2 MG PO TABS: 0.2000 mg | ORAL_TABLET | Freq: Two times a day (BID) | ORAL | Status: DC

## 2014-12-28 MED ORDER — ATORVASTATIN CALCIUM 20 MG PO TABS: 20.0000 mg | ORAL_TABLET | Freq: Every day | ORAL | Status: DC

## 2014-12-28 MED ORDER — CLOPIDOGREL BISULFATE 75 MG PO TABS: 75.0000 mg | ORAL_TABLET | Freq: Every day | ORAL | Status: DC

## 2014-12-28 MED ORDER — TADALAFIL 20 MG PO TABS: 20.0000 mg | ORAL_TABLET | Freq: Every day | ORAL | Status: DC | PRN

## 2014-12-28 MED ORDER — AMLODIPINE BESYLATE 10 MG PO TABS: 10.0000 mg | ORAL_TABLET | Freq: Every day | ORAL | Status: DC

## 2014-12-28 NOTE — Patient Instructions (Signed)
You are doing well. No medication changes were made.  Please call us if you have new issues that need to be addressed before your next appt.  Your physician wants you to follow-up in: 6 months.  You will receive a reminder letter in the mail two months in advance. If you don't receive a letter, please call our office to schedule the follow-up appointment.   

## 2014-12-28 NOTE — Assessment & Plan Note (Signed)
Encouraged alcohol cessation.  ?

## 2014-12-28 NOTE — Assessment & Plan Note (Signed)
We have stressed the importance of avoiding cocaine and other substances Discussed their effect on the heart

## 2014-12-28 NOTE — Assessment & Plan Note (Signed)
We have discussed the importance of blood pressure control in an effort to minimize LVH, diastolic dysfunction

## 2014-12-28 NOTE — Assessment & Plan Note (Signed)
He does report that he has lost weight since last clinic visit. Recommended he continue his aggressive diet

## 2014-12-28 NOTE — Assessment & Plan Note (Signed)
We have encouraged continued exercise, careful diet management in an effort to lose weight. 

## 2014-12-28 NOTE — Assessment & Plan Note (Signed)
Medication refills have been sent in Stressed the importance of medication compliance

## 2014-12-28 NOTE — Progress Notes (Signed)
Patient ID: [name] Lim., male    DOB: [birth year], 54 y.o.   MRN: [id number]  HPI Comments: 54 year old male with history of hypertension, diabetes, CVAs, polysubstance abuse with cocaine, bipolar disorder, hyperlipidemia, obesity, admitted to the hospital 05/12/2013 with shortness of breath and chest pain. Recent stroke with admission to the hospital February 2015 in the setting of severe hypertension. He presents for follow-up of his blood pressure Previous cardiac catheterization for chronic chest pain showing no significant coronary artery disease, done in 2015  In follow-up today, he thinks she is out of some of his medications. He would like Cialis Going through the list of his medications, he is not sure what he has been taking. Realizes his blood pressures running high. Does not check at home Denies any other symptoms, reports he has been losing some weight  EKG on today's visit shows normal sinus rhythm with rate 83 bpm, diffuse T-wave abnormality through the anterior precordial leads, one and aVL  Other past medical history Previous Echocardiogram showed normal ejection fraction with severe concentric LVH, diastolic dysfunction, unable to exclude ASD/PFO  Prior stroke in 03/24/2013 was an acute right frontal MCA lesion, embolic versus atherosclerotic TEE was recommended but the patient refused Drug test positive for cocaine and MDMA  Hemoglobin A1c of 10 in 2/ 2015 Creatinine 1.4    No Known Allergies  Current Outpatient Prescriptions on File Prior to Visit  Medication Sig Dispense Refill  . aspirin EC 81 MG EC tablet Take 1 tablet (81 mg total) by mouth daily. 30 tablet 0  . chlorthalidone (HYGROTON) 25 MG tablet Take 1 tablet (25 mg total) by mouth daily. 30 tablet 0  . HYDROcodone-acetaminophen (NORCO) 5-325 MG tablet Take 1-2 tablets by mouth every 4 (four) hours as needed for moderate pain. 15 tablet 0  . hydrOXYzine (ATARAX/VISTARIL) 25 MG tablet Take 1 tablet  (25 mg total) by mouth 3 (three) times daily as needed (anxiety). 90 tablet 0  . insulin aspart (NOVOLOG) 100 UNIT/ML injection Inject 10 Units into the skin 2 (two) times daily with a meal. 30 mL 0  . metFORMIN (GLUCOPHAGE) 500 MG tablet Take 1 tablet (500 mg total) by mouth 2 (two) times daily before a meal. 60 tablet 0  . pantoprazole (PROTONIX) 40 MG tablet Take 1 tablet (40 mg total) by mouth daily. 30 tablet 0  . sertraline (ZOLOFT) 100 MG tablet Take 0.5 tablets (50 mg total) by mouth daily. 30 tablet 0  . traZODone (DESYREL) 150 MG tablet Take 1 tablet (150 mg total) by mouth at bedtime. 30 tablet 0   No current facility-administered medications on file prior to visit.    Past Medical History  Diagnosis Date  . Hypertension   . Diabetes mellitus   . GERD (gastroesophageal reflux disease)   . Pericarditis   . Polysubstance abuse     cocaine, marijuana  . Auditory hallucinations   . Depression   . Hx of medication noncompliance   . History of suicide attempt   . Stroke [facility]) February 2015    Right MCA aneurysm; head MRI - acute infarct of the right frontal lobe. Smaller acute infarct of the left frontal lobe. Thought to be due to cerebral emboli. Left PICA infarct. Small chronic lacunar infarct right internal capsule. Chronic microvascular ischemic change with white matter change.  . Coronary artery disease     No reported catheterization. Negative Myoview stress test and 2013.  Marland Kitchen Syncope and collapse   . Bipolar disorder (  HCC)   . Obesity, Class II, BMI 35-39.9, with comorbidity (HCC)   . Hypertensive hypertrophic cardiomyopathy [facility](HCC) February 2015    Severe concentric LVH on echocardiogram.  . Aortic stenosis, mild February 2015    Past Surgical History  Procedure Laterality Date  . Pericardial fluid drainage    . Transthoracic echocardiogram  03/23/2013    EF 5560%. Elevated left atrial pressure. Severe concentric LVH. Mild aortic stenosis. -- Appearance of hypertensive  hypertrophic heart disease.  No clear cardioembolic source  . Nm myoview ltd  July 2013    No ischemia or infarct. There is inferior wall defec suggestive of diaphragmatic attenuation. Mild LV dilation at to be due to hypertensive heart disease. EF 55%. Poor exercise tolerance.  . Carotid dopplers:  03/23/2013    Interval thickening of bilateral common carotid no significant plaque formation. Tortuosity but no significant stenosis. Patent bilateral vertebral  arteries    Social History  reports that he has quit smoking. His smoking use included Cigars. He does not have any smokeless tobacco history on file. He reports that he does not drink alcohol or use illicit drugs.  Family History family history includes Arthritis in his father and sister; Diabetes type II in his mother and sister; Hypertension in his father, mother, and sister; Stroke in his mother.    Review of Systems  Respiratory: Negative.   Cardiovascular: Negative.   Gastrointestinal: Negative.   Musculoskeletal: Negative.   Neurological: Negative.   Hematological: Negative.   Psychiatric/Behavioral: Negative.   All other systems reviewed and are negative.   BP 168/110 mmHg  Pulse 110  Ht 5\' 11"  (1.803 m)  Wt 201 lb 8 oz (91.4 kg)  BMI 28.12 kg/m2  Physical Exam  Constitutional: He is oriented to person, place, and time. He appears well-developed and well-nourished.  HENT:  Head: Normocephalic.  Nose: Nose normal.  Mouth/Throat: Oropharynx is clear and moist.  Eyes: Conjunctivae are normal. Pupils are equal, round, and reactive to light.  Neck: Normal range of motion. Neck supple. No JVD present.  Cardiovascular: Normal rate, regular rhythm, S1 normal, S2 normal, normal heart sounds and intact distal pulses.  Exam reveals no gallop and no friction rub.   No murmur heard. Pulmonary/Chest: Effort normal and breath sounds normal. No respiratory distress. He has no wheezes. He has no rales. He exhibits no tenderness.   Abdominal: Soft. Bowel sounds are normal. He exhibits no distension. There is no tenderness.  Musculoskeletal: Normal range of motion. He exhibits no edema or tenderness.  Lymphadenopathy:    He has no cervical adenopathy.  Neurological: He is alert and oriented to person, place, and time. Coordination normal.  Skin: Skin is warm and dry. No rash noted. No erythema.  Psychiatric: He has a normal mood and affect. His behavior is normal. Judgment and thought content normal.      Assessment and Plan   Nursing note and vitals reviewed.

## 2015-01-02 ENCOUNTER — Telehealth: Payer: Self-pay | Admitting: *Deleted

## 2015-01-02 NOTE — Telephone Encounter (Signed)
Pt calling asking for [name] [name] samples.  Patient calling the office for samples of medication:   1.  What medication and dosage are you requesting samples for? [name] [name]    2.  Are you currently out of this medication? Yes

## 2015-01-02 NOTE — Telephone Encounter (Signed)
Notified no samples available of cialis. The patient will check back on Friday.

## 2015-01-09 ENCOUNTER — Telehealth: Payer: Self-pay | Admitting: *Deleted

## 2015-01-09 NOTE — Telephone Encounter (Signed)
We do not have any cialis 20 mg samples available at this time.

## 2015-01-09 NOTE — Telephone Encounter (Signed)
Pt calling asking for Cilas samples.  °Patient calling the office for samples of medication: ° ° °1.  What medication and dosage are you requesting samples for? Cilas   ° °2.  Are you currently out of this medication? Yes  ° ° ° °

## 2015-01-11 ENCOUNTER — Emergency Department
Admission: EM | Admit: 2015-01-11 | Discharge: 2015-01-11 | Disposition: A | Discharge status: Home / Self Care | Payer: Medicaid Other | Attending: Emergency Medicine | Admitting: Emergency Medicine

## 2015-01-11 ENCOUNTER — Emergency Department: Payer: Medicaid Other

## 2015-01-11 ENCOUNTER — Encounter: Payer: Self-pay | Admitting: Emergency Medicine

## 2015-01-11 DIAGNOSIS — Z79899 Other long term (current) drug therapy: Secondary | ICD-10-CM | POA: Insufficient documentation

## 2015-01-11 DIAGNOSIS — Z87891 Personal history of nicotine dependence: Secondary | ICD-10-CM | POA: Diagnosis not present

## 2015-01-11 DIAGNOSIS — Z7982 Long term (current) use of aspirin: Secondary | ICD-10-CM | POA: Insufficient documentation

## 2015-01-11 DIAGNOSIS — E119 Type 2 diabetes mellitus without complications: Secondary | ICD-10-CM | POA: Diagnosis not present

## 2015-01-11 DIAGNOSIS — I10 Essential (primary) hypertension: Secondary | ICD-10-CM | POA: Insufficient documentation

## 2015-01-11 DIAGNOSIS — Z794 Long term (current) use of insulin: Secondary | ICD-10-CM | POA: Diagnosis not present

## 2015-01-11 DIAGNOSIS — R51 Headache: Secondary | ICD-10-CM | POA: Insufficient documentation

## 2015-01-11 DIAGNOSIS — Z7984 Long term (current) use of oral hypoglycemic drugs: Secondary | ICD-10-CM | POA: Insufficient documentation

## 2015-01-11 DIAGNOSIS — Z7902 Long term (current) use of antithrombotics/antiplatelets: Secondary | ICD-10-CM | POA: Diagnosis not present

## 2015-01-11 DIAGNOSIS — G8929 Other chronic pain: Secondary | ICD-10-CM | POA: Diagnosis not present

## 2015-01-11 MED ORDER — BUTALBITAL-APAP-CAFFEINE 50-325-40 MG PO TABS
2.0000 | ORAL_TABLET | Freq: Once | ORAL | Status: AC
  Administered 2015-01-11: 2 via ORAL

## 2015-01-11 MED ORDER — BUTALBITAL-APAP-CAFFEINE 50-325-40 MG PO TABS: 1.0000 | ORAL_TABLET | Freq: Four times a day (QID) | ORAL | Status: DC | PRN

## 2015-01-11 MED ORDER — FLUORESCEIN SODIUM 1 MG OP STRP
ORAL_STRIP | OPHTHALMIC | Status: AC
  Filled 2015-01-11: qty 2

## 2015-01-11 MED ORDER — BUTALBITAL-APAP-CAFFEINE 50-325-40 MG PO TABS
ORAL_TABLET | ORAL | Status: AC
  Administered 2015-01-11: 2 via ORAL
  Filled 2015-01-11: qty 2

## 2015-01-11 NOTE — Discharge Instructions (Signed)
Please follow-up with your doctor as soon as possible regarding your continued headache as well as your high blood pressure. Return to the emergency department for any acutely worsening symptoms.   General Headache Without Cause A headache is pain or discomfort felt around the head or neck area. The specific cause of a headache may not be found. There are many causes and types of headaches. A few common ones are:  Tension headaches.  Migraine headaches.  Cluster headaches.  Chronic daily headaches. HOME CARE INSTRUCTIONS  Watch your condition for any changes. Take these steps to help with your condition: Managing Pain  Take over-the-counter and prescription medicines only as told by your health care provider.  Lie down in a dark, quiet room when you have a headache.  If directed, apply ice to the head and neck area:  Put ice in a plastic bag.  Place a towel between your skin and the bag.  Leave the ice on for 20 minutes, 2-3 times per day.  Use a heating pad or hot shower to apply heat to the head and neck area as told by your health care provider.  Keep lights dim if bright lights bother you or make your headaches worse. Eating and Drinking  Eat meals on a regular schedule.  Limit alcohol use.  Decrease the amount of caffeine you drink, or stop drinking caffeine. General Instructions  Keep all follow-up visits as told by your health care provider. This is important.  Keep a headache journal to help find out what may trigger your headaches. For example, write down:  What you eat and drink.  How much sleep you get.  Any change to your diet or medicines.  Try massage or other relaxation techniques.  Limit stress.  Sit up straight, and do not tense your muscles.  Do not use tobacco products, including cigarettes, chewing tobacco, or e-cigarettes. If you need help quitting, ask your health care provider.  Exercise regularly as told by your health care  provider.  Sleep on a regular schedule. Get 7-9 hours of sleep, or the amount recommended by your health care provider. SEEK MEDICAL CARE IF:   Your symptoms are not helped by medicine.  You have a headache that is different from the usual headache.  You have nausea or you vomit.  You have a fever. SEEK IMMEDIATE MEDICAL CARE IF:   Your headache becomes severe.  You have repeated vomiting.  You have a stiff neck.  You have a loss of vision.  You have problems with speech.  You have pain in the eye or ear.  You have muscular weakness or loss of muscle control.  You lose your balance or have trouble walking.  You feel faint or pass out.  You have confusion.   This information is not intended to replace advice given to you by your health care provider. Make sure you discuss any questions you have with your health care provider.   Document Released: 01/21/2005 Document Revised: 10/12/2014 Document Reviewed: 05/16/2014 Elsevier Interactive Patient Education [employer]2016 [employer]

## 2015-01-11 NOTE — ED Notes (Addendum)
Pt states he fell back on November 9th and hit his head came here and had ct scan performed, scan was negative. Pt presents today with c/o ongoing headache and dizziness from the fall.

## 2015-01-11 NOTE — ED Provider Notes (Signed)
[facility] Emergency Department Provider Note  Time seen: 6:00 PM  I have reviewed the triage vital signs and the nursing notes.   HISTORY  Chief Complaint Headache    HPI Abdulkareem Rasool [name]. is a 54 y.o. male with a past medical history of hypertension, diabetes, GERD, CVA, who presents the emergency department with the continued headache. According to the patient he fell 12/14/14 and hit his head. Since that time he has continued with a left-sided headache. Today he states the headache was somewhat worse so he came to the emergency department for evaluation. Denies any weakness or numbness of any arm or leg, slurred speech or confusion. Describes the headache as moderate, left-sided.     Past Medical History  Diagnosis Date  . Hypertension   . Diabetes mellitus   . GERD (gastroesophageal reflux disease)   . Pericarditis   . Polysubstance abuse     cocaine, marijuana  . Auditory hallucinations   . Depression   . Hx of medication noncompliance   . History of suicide attempt   . Stroke [facility]) February 2015    Right MCA aneurysm; head MRI - acute infarct of the right frontal lobe. Smaller acute infarct of the left frontal lobe. Thought to be due to cerebral emboli. Left PICA infarct. Small chronic lacunar infarct right internal capsule. Chronic microvascular ischemic change with white matter change.  . Coronary artery disease     No reported catheterization. Negative Myoview stress test and 2013.  Marland Kitchen Syncope and collapse   . Bipolar disorder (HCC)   . Obesity, Class II, BMI 35-39.9, with comorbidity (HCC)   . Hypertensive hypertrophic cardiomyopathy [facility]) February 2015    Severe concentric LVH on echocardiogram.  . Aortic stenosis, mild February 2015    Patient Active Problem List   Diagnosis Date Noted  . Major depressive disorder, recurrent severe without psychotic features (HCC) 11/07/2014  . Tobacco use disorder 11/07/2014  . Stimulant use disorder  (HCC) crack cocaine 11/07/2014  . Alcohol use disorder, moderate, dependence (HCC) 11/07/2014  . CAD (coronary artery disease) 11/05/2014  . CVA (cerebral infarction) 10/19/2014  . LVH (left ventricular hypertrophy) 05/20/2013  . Obesity (BMI 30-39.9) 04/10/2013  . Aortic stenosis, mild 03/07/2013  . DM (diabetes mellitus), type 2, uncontrolled (HCC) 01/05/2013  . Essential hypertension, malignant 01/25/2009    Past Surgical History  Procedure Laterality Date  . Pericardial fluid drainage    . Transthoracic echocardiogram  03/23/2013    EF 5560%. Elevated left atrial pressure. Severe concentric LVH. Mild aortic stenosis. -- Appearance of hypertensive hypertrophic heart disease.  No clear cardioembolic source  . Nm myoview ltd  July 2013    No ischemia or infarct. There is inferior wall defec suggestive of diaphragmatic attenuation. Mild LV dilation at to be due to hypertensive heart disease. EF 55%. Poor exercise tolerance.  . Carotid dopplers:  03/23/2013    Interval thickening of bilateral common carotid no significant plaque formation. Tortuosity but no significant stenosis. Patent bilateral vertebral  arteries    Current Outpatient Rx  Name  Route  Sig  Dispense  Refill  . amLODipine (NORVASC) 10 MG tablet   Oral   Take 1 tablet (10 mg total) by mouth daily.   30 tablet   6   . aspirin EC 81 MG EC tablet   Oral   Take 1 tablet (81 mg total) by mouth daily.   30 tablet   0   . atorvastatin (LIPITOR) 20 MG  tablet   Oral   Take 1 tablet (20 mg total) by mouth daily at 6 PM.   30 tablet   6   . chlorthalidone (HYGROTON) 25 MG tablet   Oral   Take 1 tablet (25 mg total) by mouth daily.   30 tablet   0   . cloNIDine (CATAPRES) 0.2 MG tablet   Oral   Take 1 tablet (0.2 mg total) by mouth 2 (two) times daily.   60 tablet   6   . clopidogrel (PLAVIX) 75 MG tablet   Oral   Take 1 tablet (75 mg total) by mouth daily.   30 tablet   6   . HYDROcodone-acetaminophen  (NORCO) 5-325 MG tablet   Oral   Take 1-2 tablets by mouth every 4 (four) hours as needed for moderate pain.   15 tablet   0   . hydrOXYzine (ATARAX/VISTARIL) 25 MG tablet   Oral   Take 1 tablet (25 mg total) by mouth 3 (three) times daily as needed (anxiety).   90 tablet   0   . insulin aspart (NOVOLOG) 100 UNIT/ML injection   Subcutaneous   Inject 10 Units into the skin 2 (two) times daily with a meal.   30 mL   0   . lisinopril (PRINIVIL,ZESTRIL) 40 MG tablet   Oral   Take 1 tablet (40 mg total) by mouth daily.   30 tablet   6   . metFORMIN (GLUCOPHAGE) 500 MG tablet   Oral   Take 1 tablet (500 mg total) by mouth 2 (two) times daily before a meal.   60 tablet   0   . pantoprazole (PROTONIX) 40 MG tablet   Oral   Take 1 tablet (40 mg total) by mouth daily.   30 tablet   0   . sertraline (ZOLOFT) 100 MG tablet   Oral   Take 0.5 tablets (50 mg total) by mouth daily.   30 tablet   0   . tadalafil (CIALIS) 20 MG tablet   Oral   Take 1 tablet (20 mg total) by mouth daily as needed for erectile dysfunction.   3 tablet   5   . traZODone (DESYREL) 150 MG tablet   Oral   Take 1 tablet (150 mg total) by mouth at bedtime.   30 tablet   0     Allergies Review of patient's allergies indicates no known allergies.  Family History  Problem Relation Age of Onset  . Stroke Mother   . Diabetes type II Mother   . Hypertension Mother   . Hypertension Father   . Arthritis Father   . Arthritis Sister   . Diabetes type II Sister   . Hypertension Sister     Social History Social History  Substance Use Topics  . Smoking status: Former Smoker -- 0.50 packs/day for 15 years    Types: Cigars  . Smokeless tobacco: None  . Alcohol Use: No     Comment: occasionally; reports 1 pint of vodka per month at paydaysocially    Review of Systems Constitutional: Negative for fever. Eyes: Negative for visual changes. Cardiovascular: Negative for chest pain. Respiratory:  Negative for shortness of breath. Gastrointestinal: Negative for abdominal pain Musculoskeletal: Negative for back pain. Neurological: Positive for left-sided headache 1 month, negative for focal weakness or numbness 10-point ROS otherwise negative.  ____________________________________________   PHYSICAL EXAM:  VITAL SIGNS: ED Triage Vitals  Enc Vitals Group     BP 01/11/15  1651 161/114 mmHg     Pulse Rate 01/11/15 1649 81     Resp 01/11/15 1649 20     Temp 01/11/15 1649 98.6 F (37 C)     Temp Source 01/11/15 1649 Oral     SpO2 01/11/15 1649 99 %     Weight 01/11/15 1649 250 lb (113.399 kg)     Height 01/11/15 1649 5\' 11"  (1.803 m)     Head Cir --      Peak Flow --      Pain Score 01/11/15 1650 10     Pain Loc --      Pain Edu? --      Excl. in GC? --     Constitutional: Alert and oriented. Well appearing and in no distress. Eyes: Normal exam ENT   Head: Normocephalic and atraumatic.   Mouth/Throat: Mucous membranes are moist. Cardiovascular: Normal rate, regular rhythm. No murmur Respiratory: Normal respiratory effort without tachypnea nor retractions. Breath sounds are clear Gastrointestinal: Soft and nontender. No distention.   Musculoskeletal: Nontender with normal range of motion in all extremities.  Neurologic:  Normal speech and language. No gross focal neurologic deficits. No pronator drift. Equal grip strength. Skin:  Skin is warm, dry and intact.  Psychiatric: Mood and affect are normal. Speech and behavior are normal. Patient exhibits appropriate insight and judgment.  ____________________________________________    RADIOLOGY  CT largely unchanged  ____________________________________________    INITIAL IMPRESSION / ASSESSMENT AND PLAN / ED COURSE  Pertinent labs & imaging results that were available during my care of the patient were reviewed by me and considered in my medical decision making (see chart for details).  Patient presents  with continued headache for one month since a fall 12/14/14. Repeat CT head to rule out subdural hematoma is negative for acute intracranial abnormality, continues to show remote left cerebellar infarct. We will treat the patient with Fioricet in the emergency department, and feed the patient as he is asking for food. Overall the patient appears well.  Repeat CT unchanged, patient feeling better after eating and Fioricet. We'll discharge home with Fioricet, and PCP follow-up.  ____________________________________________   FINAL CLINICAL IMPRESSION(S) / ED DIAGNOSES  Headache Hypertension  Minna AntisKevin Hermela Hardt, MD 01/11/15 [phone]

## 2015-03-09 ENCOUNTER — Inpatient Hospital Stay
Admission: EM | Admit: 2015-03-09 | Discharge: 2015-03-10 | DRG: 897 | Disposition: A | Discharge status: Home / Self Care | Payer: Medicaid Other | Source: Intra-hospital | Attending: Psychiatry | Admitting: Psychiatry

## 2015-03-09 ENCOUNTER — Emergency Department
Admission: EM | Admit: 2015-03-09 | Discharge: 2015-03-09 | Disposition: A | Discharge status: Other Acute Inpatient Hospital | Payer: Medicaid Other | Attending: Emergency Medicine | Admitting: Emergency Medicine

## 2015-03-09 DIAGNOSIS — F329 Major depressive disorder, single episode, unspecified: Secondary | ICD-10-CM | POA: Diagnosis not present

## 2015-03-09 DIAGNOSIS — Z9119 Patient's noncompliance with other medical treatment and regimen: Secondary | ICD-10-CM | POA: Diagnosis not present

## 2015-03-09 DIAGNOSIS — Z8261 Family history of arthritis: Secondary | ICD-10-CM

## 2015-03-09 DIAGNOSIS — I10 Essential (primary) hypertension: Secondary | ICD-10-CM | POA: Diagnosis not present

## 2015-03-09 DIAGNOSIS — R45851 Suicidal ideations: Secondary | ICD-10-CM | POA: Diagnosis present

## 2015-03-09 DIAGNOSIS — K219 Gastro-esophageal reflux disease without esophagitis: Secondary | ICD-10-CM | POA: Diagnosis present

## 2015-03-09 DIAGNOSIS — G8929 Other chronic pain: Secondary | ICD-10-CM | POA: Diagnosis not present

## 2015-03-09 DIAGNOSIS — E1165 Type 2 diabetes mellitus with hyperglycemia: Secondary | ICD-10-CM | POA: Diagnosis present

## 2015-03-09 DIAGNOSIS — Z6835 Body mass index (BMI) 35.0-35.9, adult: Secondary | ICD-10-CM

## 2015-03-09 DIAGNOSIS — Z8673 Personal history of transient ischemic attack (TIA), and cerebral infarction without residual deficits: Secondary | ICD-10-CM

## 2015-03-09 DIAGNOSIS — E785 Hyperlipidemia, unspecified: Secondary | ICD-10-CM | POA: Diagnosis present

## 2015-03-09 DIAGNOSIS — Z794 Long term (current) use of insulin: Secondary | ICD-10-CM | POA: Insufficient documentation

## 2015-03-09 DIAGNOSIS — G4700 Insomnia, unspecified: Secondary | ICD-10-CM | POA: Diagnosis present

## 2015-03-09 DIAGNOSIS — F332 Major depressive disorder, recurrent severe without psychotic features: Secondary | ICD-10-CM | POA: Diagnosis present

## 2015-03-09 DIAGNOSIS — Z716 Tobacco abuse counseling: Secondary | ICD-10-CM | POA: Diagnosis not present

## 2015-03-09 DIAGNOSIS — R441 Visual hallucinations: Secondary | ICD-10-CM | POA: Diagnosis not present

## 2015-03-09 DIAGNOSIS — Z8249 Family history of ischemic heart disease and other diseases of the circulatory system: Secondary | ICD-10-CM

## 2015-03-09 DIAGNOSIS — Z79899 Other long term (current) drug therapy: Secondary | ICD-10-CM

## 2015-03-09 DIAGNOSIS — E669 Obesity, unspecified: Secondary | ICD-10-CM | POA: Diagnosis present

## 2015-03-09 DIAGNOSIS — F1494 Cocaine use, unspecified with cocaine-induced mood disorder: Principal | ICD-10-CM | POA: Diagnosis present

## 2015-03-09 DIAGNOSIS — I2510 Atherosclerotic heart disease of native coronary artery without angina pectoris: Secondary | ICD-10-CM | POA: Diagnosis present

## 2015-03-09 DIAGNOSIS — F1020 Alcohol dependence, uncomplicated: Secondary | ICD-10-CM | POA: Diagnosis present

## 2015-03-09 DIAGNOSIS — Z811 Family history of alcohol abuse and dependence: Secondary | ICD-10-CM

## 2015-03-09 DIAGNOSIS — R51 Headache: Secondary | ICD-10-CM | POA: Diagnosis not present

## 2015-03-09 DIAGNOSIS — Z7902 Long term (current) use of antithrombotics/antiplatelets: Secondary | ICD-10-CM | POA: Insufficient documentation

## 2015-03-09 DIAGNOSIS — Z7982 Long term (current) use of aspirin: Secondary | ICD-10-CM

## 2015-03-09 DIAGNOSIS — F17200 Nicotine dependence, unspecified, uncomplicated: Secondary | ICD-10-CM | POA: Diagnosis present

## 2015-03-09 DIAGNOSIS — IMO0002 Reserved for concepts with insufficient information to code with codable children: Secondary | ICD-10-CM | POA: Diagnosis present

## 2015-03-09 DIAGNOSIS — F17210 Nicotine dependence, cigarettes, uncomplicated: Secondary | ICD-10-CM | POA: Insufficient documentation

## 2015-03-09 DIAGNOSIS — Z7984 Long term (current) use of oral hypoglycemic drugs: Secondary | ICD-10-CM | POA: Diagnosis not present

## 2015-03-09 DIAGNOSIS — Z823 Family history of stroke: Secondary | ICD-10-CM

## 2015-03-09 DIAGNOSIS — E119 Type 2 diabetes mellitus without complications: Secondary | ICD-10-CM | POA: Diagnosis present

## 2015-03-09 DIAGNOSIS — F1410 Cocaine abuse, uncomplicated: Secondary | ICD-10-CM | POA: Insufficient documentation

## 2015-03-09 DIAGNOSIS — F1590 Other stimulant use, unspecified, uncomplicated: Secondary | ICD-10-CM | POA: Diagnosis present

## 2015-03-09 DIAGNOSIS — F14929 Cocaine use, unspecified with intoxication, unspecified: Secondary | ICD-10-CM | POA: Diagnosis not present

## 2015-03-09 DIAGNOSIS — Z9114 Patient's other noncompliance with medication regimen: Secondary | ICD-10-CM

## 2015-03-09 DIAGNOSIS — Z915 Personal history of self-harm: Secondary | ICD-10-CM | POA: Diagnosis not present

## 2015-03-09 DIAGNOSIS — Z833 Family history of diabetes mellitus: Secondary | ICD-10-CM

## 2015-03-09 DIAGNOSIS — I639 Cerebral infarction, unspecified: Secondary | ICD-10-CM | POA: Diagnosis present

## 2015-03-09 LAB — ETHANOL

## 2015-03-09 LAB — COMPREHENSIVE METABOLIC PANEL
ALK PHOS: 64 U/L (ref 38–126)
ALT: 13 U/L — ABNORMAL LOW (ref 17–63)
ANION GAP: 6 (ref 5–15)
AST: 19 U/L (ref 15–41)
Albumin: 4.2 g/dL (ref 3.5–5.0)
BILIRUBIN TOTAL: 1.3 mg/dL — AB (ref 0.3–1.2)
BUN: 21 mg/dL — AB (ref 6–20)
CALCIUM: 9 mg/dL (ref 8.9–10.3)
CO2: 27 mmol/L (ref 22–32)
Chloride: 106 mmol/L (ref 101–111)
Creatinine, Ser: 1.42 mg/dL — ABNORMAL HIGH (ref 0.61–1.24)
GFR calc Af Amer: >60 mL/min (ref >60)
GFR, EST NON AFRICAN AMERICAN: 55 mL/min — AB (ref >60)
GLUCOSE: 124 mg/dL — AB (ref 65–99)
POTASSIUM: 3.6 mmol/L (ref 3.5–5.1)
Sodium: 139 mmol/L (ref 135–145)
TOTAL PROTEIN: 7.4 g/dL (ref 6.5–8.1)

## 2015-03-09 LAB — CBC
HEMATOCRIT: 46.8 % (ref 40.0–52.0)
Hemoglobin: 15.7 g/dL (ref 13.0–18.0)
MCH: 31 pg (ref 26.0–34.0)
MCHC: 33.5 g/dL (ref 32.0–36.0)
MCV: 92.5 fL (ref 80.0–100.0)
PLATELETS: 192 10*3/uL (ref 150–440)
RBC: 5.07 MIL/uL (ref 4.40–5.90)
RDW: 12.8 % (ref 11.5–14.5)
WBC: 11.2 10*3/uL — AB (ref 3.8–10.6)

## 2015-03-09 LAB — URINE DRUG SCREEN, QUALITATIVE (ARMC ONLY)
AMPHETAMINES, UR SCREEN: NOT DETECTED
BARBITURATES, UR SCREEN: NOT DETECTED
BENZODIAZEPINE, UR SCRN: NOT DETECTED
CANNABINOID 50 NG, UR ~~LOC~~: NOT DETECTED
Cocaine Metabolite,Ur ~~LOC~~: POSITIVE — AB
MDMA (Ecstasy)Ur Screen: NOT DETECTED
Methadone Scn, Ur: NOT DETECTED
OPIATE, UR SCREEN: NOT DETECTED
PHENCYCLIDINE (PCP) UR S: NOT DETECTED
Tricyclic, Ur Screen: NOT DETECTED

## 2015-03-09 LAB — ACETAMINOPHEN LEVEL: Acetaminophen (Tylenol), Serum: <10 ug/mL — ABNORMAL LOW (ref 10–30)

## 2015-03-09 LAB — SALICYLATE LEVEL: Salicylate Lvl: <4 mg/dL (ref 2.8–30.0)

## 2015-03-09 MED ORDER — ALUM & MAG HYDROXIDE-SIMETH 200-200-20 MG/5ML PO SUSP: 30.0000 mL | ORAL | Status: DC | PRN

## 2015-03-09 MED ORDER — TRAZODONE HCL 50 MG PO TABS: 150.0000 mg | ORAL_TABLET | Freq: Every day | ORAL | Status: DC

## 2015-03-09 MED ORDER — PANTOPRAZOLE SODIUM 40 MG PO TBEC
40.0000 mg | DELAYED_RELEASE_TABLET | Freq: Every day | ORAL | Status: DC
  Administered 2015-03-10: 40 mg via ORAL
  Filled 2015-03-09: qty 1

## 2015-03-09 MED ORDER — TRAZODONE HCL 50 MG PO TABS
150.0000 mg | ORAL_TABLET | Freq: Once | ORAL | Status: AC
  Administered 2015-03-10: 150 mg via ORAL
  Filled 2015-03-09: qty 1

## 2015-03-09 MED ORDER — METFORMIN HCL 500 MG PO TABS
500.0000 mg | ORAL_TABLET | Freq: Two times a day (BID) | ORAL | Status: DC
  Administered 2015-03-10 (×2): 500 mg via ORAL
  Filled 2015-03-09 (×2): qty 1

## 2015-03-09 MED ORDER — PANTOPRAZOLE SODIUM 40 MG PO TBEC: 40.0000 mg | DELAYED_RELEASE_TABLET | Freq: Every day | ORAL | Status: DC

## 2015-03-09 MED ORDER — ATORVASTATIN CALCIUM 20 MG PO TABS: 20.0000 mg | ORAL_TABLET | Freq: Every day | ORAL | Status: DC

## 2015-03-09 MED ORDER — SERTRALINE HCL 25 MG PO TABS
50.0000 mg | ORAL_TABLET | Freq: Every day | ORAL | Status: DC
  Administered 2015-03-10: 50 mg via ORAL
  Filled 2015-03-09: qty 2

## 2015-03-09 MED ORDER — CLONIDINE HCL 0.1 MG PO TABS
0.2000 mg | ORAL_TABLET | Freq: Two times a day (BID) | ORAL | Status: DC
  Administered 2015-03-10: 0.2 mg via ORAL
  Filled 2015-03-09: qty 2

## 2015-03-09 MED ORDER — LISINOPRIL 20 MG PO TABS
40.0000 mg | ORAL_TABLET | Freq: Every day | ORAL | Status: DC
  Administered 2015-03-10: 40 mg via ORAL
  Filled 2015-03-09: qty 2

## 2015-03-09 MED ORDER — METFORMIN HCL 500 MG PO TABS: 500.0000 mg | ORAL_TABLET | Freq: Two times a day (BID) | ORAL | Status: DC

## 2015-03-09 MED ORDER — CLOPIDOGREL BISULFATE 75 MG PO TABS
75.0000 mg | ORAL_TABLET | Freq: Every day | ORAL | Status: DC
  Filled 2015-03-09: qty 1

## 2015-03-09 MED ORDER — TRAZODONE HCL 50 MG PO TABS
150.0000 mg | ORAL_TABLET | Freq: Every day | ORAL | Status: DC
Start: 2015-03-09 — End: 2015-03-09
  Administered 2015-03-09: 150 mg via ORAL
  Filled 2015-03-09: qty 1

## 2015-03-09 MED ORDER — LISINOPRIL 20 MG PO TABS: 40.0000 mg | ORAL_TABLET | Freq: Every day | ORAL | Status: DC

## 2015-03-09 MED ORDER — ASPIRIN EC 81 MG PO TBEC
81.0000 mg | DELAYED_RELEASE_TABLET | Freq: Every day | ORAL | Status: DC
  Administered 2015-03-10: 81 mg via ORAL
  Filled 2015-03-09: qty 1

## 2015-03-09 MED ORDER — CLOPIDOGREL BISULFATE 75 MG PO TABS
75.0000 mg | ORAL_TABLET | Freq: Every day | ORAL | Status: DC
  Administered 2015-03-10: 75 mg via ORAL
  Filled 2015-03-09: qty 1

## 2015-03-09 MED ORDER — ASPIRIN EC 81 MG PO TBEC
81.0000 mg | DELAYED_RELEASE_TABLET | Freq: Every day | ORAL | Status: DC
  Filled 2015-03-09: qty 1

## 2015-03-09 MED ORDER — ACETAMINOPHEN 325 MG PO TABS
650.0000 mg | ORAL_TABLET | Freq: Four times a day (QID) | ORAL | Status: DC | PRN
  Administered 2015-03-10: 650 mg via ORAL
  Filled 2015-03-09 (×2): qty 2

## 2015-03-09 MED ORDER — INSULIN ASPART 100 UNIT/ML ~~LOC~~ SOLN
10.0000 [IU] | Freq: Two times a day (BID) | SUBCUTANEOUS | Status: DC
  Filled 2015-03-09: qty 0.1

## 2015-03-09 MED ORDER — IBUPROFEN 600 MG PO TABS
600.0000 mg | ORAL_TABLET | Freq: Once | ORAL | Status: AC
  Administered 2015-03-09: 600 mg via ORAL
  Filled 2015-03-09: qty 1

## 2015-03-09 MED ORDER — INSULIN ASPART 100 UNIT/ML ~~LOC~~ SOLN
10.0000 [IU] | Freq: Two times a day (BID) | SUBCUTANEOUS | Status: DC
Start: 2015-03-10 — End: 2015-03-10
  Administered 2015-03-10: 09:00:00 via SUBCUTANEOUS
  Administered 2015-03-10: 10 [IU] via SUBCUTANEOUS
  Filled 2015-03-09 (×2): qty 10

## 2015-03-09 MED ORDER — CLONIDINE HCL 0.1 MG PO TABS
0.2000 mg | ORAL_TABLET | Freq: Two times a day (BID) | ORAL | Status: DC
  Administered 2015-03-09: 0.2 mg via ORAL
  Filled 2015-03-09: qty 2

## 2015-03-09 MED ORDER — ATORVASTATIN CALCIUM 20 MG PO TABS
20.0000 mg | ORAL_TABLET | Freq: Every day | ORAL | Status: DC
  Administered 2015-03-10: 20 mg via ORAL
  Filled 2015-03-09: qty 1

## 2015-03-09 MED ORDER — CHLORTHALIDONE 25 MG PO TABS
25.0000 mg | ORAL_TABLET | Freq: Every day | ORAL | Status: DC
  Filled 2015-03-09: qty 1

## 2015-03-09 MED ORDER — MAGNESIUM HYDROXIDE 400 MG/5ML PO SUSP: 30.0000 mL | Freq: Every day | ORAL | Status: DC | PRN

## 2015-03-09 MED ORDER — SERTRALINE HCL 50 MG PO TABS: 50.0000 mg | ORAL_TABLET | Freq: Every day | ORAL | Status: DC

## 2015-03-09 MED ORDER — AMLODIPINE BESYLATE 10 MG PO TABS
10.0000 mg | ORAL_TABLET | Freq: Every day | ORAL | Status: DC
  Administered 2015-03-10: 10 mg via ORAL
  Filled 2015-03-09: qty 1

## 2015-03-09 MED ORDER — CHLORTHALIDONE 25 MG PO TABS
25.0000 mg | ORAL_TABLET | Freq: Every day | ORAL | Status: DC
  Administered 2015-03-10: 25 mg via ORAL
  Filled 2015-03-09: qty 1

## 2015-03-09 MED ORDER — AMLODIPINE BESYLATE 5 MG PO TABS: 10.0000 mg | ORAL_TABLET | Freq: Every day | ORAL | Status: DC
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
## 2015-03-09 NOTE — ED Notes (Signed)
Patient watching tv, no s/s of distress, states that he does hear voices telling him to do him self in and that he has a plan to shoot himself if He has the chance. Patient with q 15 min checks and camera monitoring. Patient states that he does have a drug problem also, He has good appetite, ask for sandwich and crackers. Will continue to monitor.

## 2015-03-09 NOTE — ED Notes (Signed)
Report called to Walker Kehr RN   Pt to transfer to [facility] at this time

## 2015-03-09 NOTE — ED Notes (Signed)
Report received from Rudy Jew., RN. Pt. Alert and oriented in no distress; verbalizes having SI; denies HI; verbalizes having AVH and denies pain.  Pt. Instructed to come to me with problems or concerns.Will continue to monitor for safety via security cameras and Q 15 minute checks.

## 2015-03-09 NOTE — ED Notes (Signed)
Pt. Noted in room awake watching the tv. No complaints or concerns voiced. No distress or abnormal behavior noted. Will continue to monitor with security cameras. Q 15 minute rounds continue. 

## 2015-03-09 NOTE — ED Notes (Signed)
BEHAVIORAL HEALTH ROUNDING Patient sleeping: No. Patient alert and oriented: yes Behavior appropriate: Yes.  ; If no, describe:  Nutrition and fluids offered: yes Toileting and hygiene offered: Yes  Sitter present: q15 minute observations and security  monitoring Law enforcement present: Yes  ODS  

## 2015-03-09 NOTE — ED Provider Notes (Signed)
Time Seen: Approximately 1220 I have reviewed the triage notes  Chief Complaint: Suicidal   History of Present Illness: [name] [name]. is a 55 y.o. male who states suicidal ideation. Patient states he has plans to shoot himself if left alone. He has had a previous suicide attempt of ingesting rat poison. Patient denies any ingestion at this time. He states he is seeing shadows with visual hallucinations. He denies any head trauma. He denies any nausea, vomiting. 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 Patient is evasive when asked if he is been taking his medication or whether he has been abusing any substances. He states the headache which is chronic for him no significant changes mainly a right-sided headache. He denies any focal weakness in either upper or lower extremities. He also states some mild right-sided chest discomfort again not unusual pain for him he denies any neck back or flank discomfort   Past Medical History  Diagnosis Date  . Hypertension   . Diabetes mellitus   . GERD (gastroesophageal reflux disease)   . Pericarditis   . Polysubstance abuse     cocaine, marijuana  . Auditory hallucinations   . Depression   . Hx of medication noncompliance   . History of suicide attempt   . Stroke [facility]) February 2015    Right MCA aneurysm; head MRI - acute infarct of the right frontal lobe. Smaller acute infarct of the left frontal lobe. Thought to be due to cerebral emboli. Left PICA infarct. Small chronic lacunar infarct right internal capsule. Chronic microvascular ischemic change with white matter change.  . Coronary artery disease     No reported catheterization. Negative Myoview stress test and 2013.  Marland Kitchen Syncope and collapse   . Bipolar disorder (HCC)   . Obesity, Class II, BMI 35-39.9, with comorbidity (HCC)   . Hypertensive hypertrophic cardiomyopathy [facility]) February 2015    Severe concentric LVH on echocardiogram.  . Aortic  stenosis, mild February 2015    Patient Active Problem List   Diagnosis Date Noted  . Major depressive disorder, recurrent severe without psychotic features (HCC) 11/07/2014  . Tobacco use disorder 11/07/2014  . Stimulant use disorder (HCC) crack cocaine 11/07/2014  . Alcohol use disorder, moderate, dependence (HCC) 11/07/2014  . CAD (coronary artery disease) 11/05/2014  . CVA (cerebral infarction) 10/19/2014  . LVH (left ventricular hypertrophy) 05/20/2013  . Obesity (BMI 30-39.9) 04/10/2013  . Aortic stenosis, mild 03/07/2013  . DM (diabetes mellitus), type 2, uncontrolled (HCC) 01/05/2013  . Essential hypertension, malignant 01/25/2009    Past Surgical History  Procedure Laterality Date  . Pericardial fluid drainage    . Transthoracic echocardiogram  03/23/2013    EF 5560%. Elevated left atrial pressure. Severe concentric LVH. Mild aortic stenosis. -- Appearance of hypertensive hypertrophic heart disease.  No clear cardioembolic source  . Nm myoview ltd  July 2013    No ischemia or infarct. There is inferior wall defec suggestive of diaphragmatic attenuation. Mild LV dilation at to be due to hypertensive heart disease. EF 55%. Poor exercise tolerance.  . Carotid dopplers:  03/23/2013    Interval thickening of bilateral common carotid no significant plaque formation. Tortuosity but no significant stenosis. Patent bilateral vertebral  arteries    Past Surgical History  Procedure Laterality Date  . Pericardial fluid drainage    . Transthoracic echocardiogram  03/23/2013    EF 5560%. Elevated left atrial pressure. Severe concentric LVH. Mild aortic stenosis. -- Appearance of hypertensive  hypertrophic heart disease.  No clear cardioembolic source  . Nm myoview ltd  July 2013    No ischemia or infarct. There is inferior wall defec suggestive of diaphragmatic attenuation. Mild LV dilation at to be due to hypertensive heart disease. EF 55%. Poor exercise tolerance.  . Carotid dopplers:   03/23/2013    Interval thickening of bilateral common carotid no significant plaque formation. Tortuosity but no significant stenosis. Patent bilateral vertebral  arteries    Current Outpatient Rx  Name  Route  Sig  Dispense  Refill  . amLODipine (NORVASC) 10 MG tablet   Oral   Take 1 tablet (10 mg total) by mouth daily.   30 tablet   6   . aspirin EC 81 MG EC tablet   Oral   Take 1 tablet (81 mg total) by mouth daily.   30 tablet   0   . atorvastatin (LIPITOR) 20 MG tablet   Oral   Take 1 tablet (20 mg total) by mouth daily at 6 PM.   30 tablet   6   . butalbital-acetaminophen-caffeine (FIORICET) 50-325-40 MG tablet   Oral   Take 1-2 tablets by mouth every 6 (six) hours as needed for headache.   20 tablet   0   . chlorthalidone (HYGROTON) 25 MG tablet   Oral   Take 1 tablet (25 mg total) by mouth daily.   30 tablet   0   . cloNIDine (CATAPRES) 0.2 MG tablet   Oral   Take 1 tablet (0.2 mg total) by mouth 2 (two) times daily.   60 tablet   6   . clopidogrel (PLAVIX) 75 MG tablet   Oral   Take 1 tablet (75 mg total) by mouth daily.   30 tablet   6   . HYDROcodone-acetaminophen (NORCO) 5-325 MG tablet   Oral   Take 1-2 tablets by mouth every 4 (four) hours as needed for moderate pain.   15 tablet   0   . hydrOXYzine (ATARAX/VISTARIL) 25 MG tablet   Oral   Take 1 tablet (25 mg total) by mouth 3 (three) times daily as needed (anxiety).   90 tablet   0   . insulin aspart (NOVOLOG) 100 UNIT/ML injection   Subcutaneous   Inject 10 Units into the skin 2 (two) times daily with a meal.   30 mL   0   . lisinopril (PRINIVIL,ZESTRIL) 40 MG tablet   Oral   Take 1 tablet (40 mg total) by mouth daily.   30 tablet   6   . metFORMIN (GLUCOPHAGE) 500 MG tablet   Oral   Take 1 tablet (500 mg total) by mouth 2 (two) times daily before a meal.   60 tablet   0   . pantoprazole (PROTONIX) 40 MG tablet   Oral   Take 1 tablet (40 mg total) by mouth daily.   30  tablet   0   . sertraline (ZOLOFT) 100 MG tablet   Oral   Take 0.5 tablets (50 mg total) by mouth daily.   30 tablet   0   . tadalafil (CIALIS) 20 MG tablet   Oral   Take 1 tablet (20 mg total) by mouth daily as needed for erectile dysfunction.   3 tablet   5   . traZODone (DESYREL) 150 MG tablet   Oral   Take 1 tablet (150 mg total) by mouth at bedtime.   30 tablet   0  Allergies:  Review of patient's allergies indicates no known allergies.  Family History: Family History  Problem Relation Age of Onset  . Stroke Mother   . Diabetes type II Mother   . Hypertension Mother   . Hypertension Father   . Arthritis Father   . Arthritis Sister   . Diabetes type II Sister   . Hypertension Sister     Social History: Social History  Substance Use Topics  . Smoking status: Current Every Day Smoker -- 0.50 packs/day for 15 years    Types: Cigars  . Smokeless tobacco: None  . Alcohol Use: No     Comment: occasionally; reports 1 pint of vodka per month at paydaysocially     Review of Systems:   10 point review of systems was performed and was otherwise negative:  Constitutional: No fever Eyes: No visual disturbances ENT: No sore throat, ear pain Cardiac: No chest pain Respiratory: No shortness of breath, wheezing, or stridor Abdomen: No abdominal pain, no vomiting, No diarrhea Endocrine: No weight loss, No night sweats Extremities: No peripheral edema, cyanosis Skin: No rashes, easy bruising Neurologic: No focal weakness, trouble with speech or swollowing Urologic: No dysuria, Hematuria, or urinary frequency   Physical Exam:  ED Triage Vitals  Enc Vitals Group     BP 03/09/15 1126 120/105 mmHg     Pulse Rate 03/09/15 1126 78     Resp 03/09/15 1126 16     Temp 03/09/15 1126 97.7 F (36.5 C)     Temp Source 03/09/15 1126 Oral     SpO2 03/09/15 1126 98 %     Weight 03/09/15 1126 248 lb (112.492 kg)     Height 03/09/15 1126  (1.803 m)     Head Cir  --      Peak Flow --      Pain Score 03/09/15 1125 8     Pain Loc --      Pain Edu? --      Excl. in GC? --     General: Awake , Alert , and Oriented times 3; GCS 15 Head: Normal cephalic , atraumatic Eyes: Pupils equal , round, reactive to light Nose/Throat: No nasal drainage, patent upper airway without erythema or exudate.  Neck: Supple, Full range of motion, No anterior adenopathy or palpable thyroid masses Lungs: Clear to ascultation without wheezes , rhonchi, or rales Heart: Regular rate, regular rhythm without murmurs , gallops , or rubs Abdomen: Soft, non tender without rebound, guarding , or rigidity; bowel sounds positive and symmetric in all 4 quadrants. No organomegaly .        Extremities: 2 plus symmetric pulses. No edema, clubbing or cyanosis Neurologic: normal ambulation, Motor symmetric without deficits, sensory intact Skin: warm, dry, no rashes   Labs:   All laboratory work was reviewed including any pertinent negatives or positives listed below:  Labs Reviewed  COMPREHENSIVE METABOLIC PANEL - Abnormal; Notable for the following:    Glucose, Bld 124 (*)    BUN 21 (*)    Creatinine, Ser 1.42 (*)    ALT 13 (*)    Total Bilirubin 1.3 (*)    GFR calc non Af Amer 55 (*)    All other components within normal limits  CBC - Abnormal; Notable for the following:    WBC 11.2 (*)    All other components within normal limits  ETHANOL  SALICYLATE LEVEL  ACETAMINOPHEN LEVEL  URINE RAPID DRUG SCREEN, HOSP PERFORMED  URINE DRUG SCREEN, QUALITATIVE (  [facility] ONLY)    EKG: ED ECG REPORT I, Jennye Moccasin, the attending physician, personally viewed and interpreted this ECG.  Date: 03/09/2015 EKG Time: 1211 Rate: 78 Rhythm: normal sinus rhythm occasional PACs QRS Axis: normal Intervals: normal ST/T Wave abnormalities: normal Conduction Disturbances: none Narrative Interpretation: unremarkable Normal sinus rhythm with nonspecific T-wave inversions seen mostly in the  lateral leads. Consistent with previous EKGs performed here patient has bilateral atrial enlargement and old septal infarct changes.   ED Course: The patient's complaints of headache and chest discomfort he states or oral complaints and does not have any acute ischemic changes noted on his EKG. He does not have any altered mental status or any focal weakness at this time and given the chronic nature of his complaints I felt there was unlikely this was a life-threatening etiology and the patient is currently medically cleared to see psychiatry. The patient has involuntary commitment paperwork filled out. Patient's been placed for orders for TTS along with psychiatry consultation.   Assessment:  Suicidal ideation Visual hallucinations    Plan:  Involuntary commitment*            Jennye Moccasin, MD 03/09/15 1356

## 2015-03-09 NOTE — ED Notes (Addendum)
Pt sent here from PCP for SI, pt reports he would shoot himself. Pt denies HI, hallucinations.

## 2015-03-09 NOTE — ED Notes (Signed)
Patient sitting in dayroom, no s/s of distress. Patient is safe, q 15 min. Checks in progress and camera monitoring.

## 2015-03-09 NOTE — ED Notes (Signed)
Snack and beverage given. 
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
## 2015-03-09 NOTE — ED Notes (Signed)

## 2015-03-10 DIAGNOSIS — F1494 Cocaine use, unspecified with cocaine-induced mood disorder: Principal | ICD-10-CM

## 2015-03-10 DIAGNOSIS — F329 Major depressive disorder, single episode, unspecified: Secondary | ICD-10-CM

## 2015-03-10 DIAGNOSIS — F14929 Cocaine use, unspecified with intoxication, unspecified: Secondary | ICD-10-CM

## 2015-03-10 LAB — LIPID PANEL
CHOL/HDL RATIO: 3.9 ratio
Cholesterol: 113 mg/dL (ref 0–200)
HDL: 29 mg/dL — AB (ref >40)
LDL Cholesterol: 69 mg/dL (ref 0–99)
Triglycerides: 75 mg/dL (ref <150)
VLDL: 15 mg/dL (ref 0–40)

## 2015-03-10 LAB — TROPONIN I
TROPONIN I: 0.03 ng/mL (ref <0.031)
Troponin I: 0.04 ng/mL — ABNORMAL HIGH (ref <0.031)

## 2015-03-10 LAB — CKMB (ARMC ONLY): CK, MB: 3.4 ng/mL (ref 0.5–5.0)

## 2015-03-10 LAB — GLUCOSE, CAPILLARY
GLUCOSE-CAPILLARY: 272 mg/dL — AB (ref 65–99)
GLUCOSE-CAPILLARY: 169 mg/dL — AB (ref 65–99)

## 2015-03-10 LAB — TSH: TSH: 0.557 u[IU]/mL (ref 0.350–4.500)

## 2015-03-10 LAB — HEMOGLOBIN A1C: HEMOGLOBIN A1C: 7.3 % — AB (ref 4.0–6.0)

## 2015-03-10 MED ORDER — INSULIN ASPART 100 UNIT/ML ~~LOC~~ SOLN: 0.0000 [IU] | Freq: Every day | SUBCUTANEOUS | Status: DC

## 2015-03-10 MED ORDER — IBUPROFEN 800 MG PO TABS
800.0000 mg | ORAL_TABLET | Freq: Once | ORAL | Status: AC
  Administered 2015-03-10: 800 mg via ORAL
  Filled 2015-03-10: qty 1

## 2015-03-10 MED ORDER — NITROGLYCERIN 0.4 MG SL SUBL
0.4000 mg | SUBLINGUAL_TABLET | SUBLINGUAL | Status: DC | PRN
  Administered 2015-03-10 (×4): 0.4 mg via SUBLINGUAL
  Filled 2015-03-10: qty 1

## 2015-03-10 MED ORDER — PNEUMOCOCCAL VAC POLYVALENT 25 MCG/0.5ML IJ INJ: 0.5000 mL | INJECTION | INTRAMUSCULAR | Status: DC

## 2015-03-10 MED ORDER — NICOTINE 21 MG/24HR TD PT24
21.0000 mg | MEDICATED_PATCH | Freq: Every day | TRANSDERMAL | Status: DC
  Filled 2015-03-10: qty 1

## 2015-03-10 MED ORDER — NITROGLYCERIN 0.4 MG SL SUBL: 0.4000 mg | SUBLINGUAL_TABLET | SUBLINGUAL | Status: DC | PRN

## 2015-03-10 MED ORDER — INSULIN ASPART 100 UNIT/ML ~~LOC~~ SOLN
0.0000 [IU] | Freq: Three times a day (TID) | SUBCUTANEOUS | Status: DC
  Administered 2015-03-10: 2 [IU] via SUBCUTANEOUS
  Filled 2015-03-10: qty 2

## 2015-03-10 NOTE — Progress Notes (Signed)
Mr. Pouliot was admitted/oriented to [facility] under IVC after reportedly hearing voices to harm himself. He says he last heard them earlier this evening, and he is able to contract for safety on the unit. He denies HI and says he sometimes sees shadows. He reported HA, for which he was given Tylenol. Trazodone given as well. An hour after administration, pt was asleep. Pt reports not sleeping (about two hours per night) or eating well recently. Pt is polite and cooperative. He reported left-side weakness and said he uses a cane at home. Pt ambulated without assistance on unit -- was encouraged to use siderails and call for help if needed. Nicotine patch ordered -- pt smokes 1/2 ppd. BAL 0, but he reports drinking a fifth of liquor about once per month. He denied illicit substances; UDS positive cocaine. Pt urged to approach staff with concerns/needs/questions. Will continue to monitor for needs/safety.

## 2015-03-10 NOTE — Tx Team (Signed)
Interdisciplinary Treatment Plan Update (Adult)        Date: 03/10/2015   Time Reviewed: 9:30 AM   Progress in Treatment: Improving  Attending groups: No Participating in groups: No Taking medication as prescribed: Yes  Tolerating medication: Yes  Family/Significant other contact made: No, pt refused  Patient understands diagnosis: Yes  Discussing patient identified problems/goals with staff: Yes  Medical problems stabilized or resolved: Yes  Denies suicidal/homicidal ideation: Yes  Issues/concerns per patient self-inventory: Yes  Other:   New problem(s) identified: N/A   Discharge Plan or Barriers: Pt plans to return home to [facility] and follow up with outpatient services with [employer]  Reason for Continuation of Hospitalization:   Depression   Anxiety   Medication Stabilization   Comments: N/A   Estimated date of discharge: 03/10/15   Patient is a 55 year old male who was admitted into the hospital after presenting to the ED with AVH.  Patient has a history of cocaine use disorder, cardiovascular disease, cerebrovascular disease, diabetes hypertension and depression. Patient presented to our emergency Department on February 2nd reporting thoughts of wanting to shoot himself, he also reported complaints of having a headache and auditory hallucinations telling him to kill himself (he is started hearing these voices today after he used cocaine).  This patient was hospitalized in our unit back in October 2016. At that time he was transferred to [place] from the medical floor where he was hospitalized for chest pain. Patient was mixing cocaine with Viagra. The patient was diagnosed with depression and discharged on Zoloft. It was clear based on his laboratory results (hemoglobin A1c of 17) that he was noncompliant with his medications. From our unit he was discharged to a assisted living facility.  The patient says he was there for only a month because it did not work  out. He cannot elaborate as to the reasons for this. Since then the patient has been living in subsidized housing in [place]. Today he tells me he came into the hospital because he was tired of people around his apartment complex could keep making noise outside.  Patient says that on Tuesday of this week he drank alcohol heavily in use cocaine. He says that he has been taking all his medications. But denies seeing a psychiatrist since he left the hospital in October. He says that he has been going to [employer] for substance abuse classes.  Today he denies SI, HI. He says he continues to have voices that tell him to harm himself but denies any desire to want to hurt himself. He also reports seeing shadows. The patient reports decreased energy, poor concentration and appetite.  Patient says he is here so we can stabilize him and finding a new place to live.  Patient complained of chest pain this morning. Hospitalist service has been contacted and has been following up the patient. Troponins pending.  Pt started using crack at age 28 .  Now uses every month, last use Tuesday.  States he only drinks rarely, he last drank on Tuesday; he drank 1/2 bottle wine.  Smokes about 1/2 pack day.   Review of initial/current patient goals per problem list:  1. Goal(s): Patient will participate in aftercare plan   Met: Yes  Target date: 3-5 days post admission date   As evidenced by: Patient will participate within aftercare plan AEB aftercare provider and housing plan at discharge being identified.   2/3: Pt plans to return home to [place] and follow up  with outpatient services with [facility]    2. Goal (s): Patient will exhibit decreased depressive symptoms and suicidal ideations.   Met: Adequate for discharge per MD.  Target date: 3-5 days post admission date   As evidenced by: Patient will utilize self-rating of depression at 3 or below and demonstrate decreased signs of  depression or be deemed stable for discharge by MD.   2/3: Adequate for discharge per MD.    3. Goal(s): Patient will demonstrate decreased signs and symptoms of anxiety.   Met: Adequate for discharge per MD.  Target date: 3-5 days post admission date   As evidenced by: Patient will utilize self-rating of anxiety at 3 or below and demonstrated decreased signs of anxiety, or be deemed stable for discharge by MD   2/3: Adequate for discharge per MD.    4. Goal(s): Patient will demonstrate decreased signs of withdrawal due to substance abuse   Met: Yes  Target date: 3-5 days post admission date   As evidenced by: Patient will produce a CIWA/COWS score of 0, have stable vitals signs, and no symptoms of withdrawal   2/3: Adequate for discharge per MD.    5. Goal(s): Patient will demonstrate decreased signs of psychosis  * Met: Adequate for discharge per MD. * Target date: 3-5 days post admission date  * As evidenced by: Patient will demonstrate decreased frequency of AVH or return to baseline function   2.3: Adequate for discharge per MD.    Attendees:  Patient:  Family:  Physician: Medicial staff: Dr. Jerilee Hoh, MD    03/10/2015 9:30 AM  Nursing: Terressa Koyanagi, RN    03/10/2015 9:30 AM  Clinical Social Worker: Marylou Flesher, [place]  03/10/2015 9:30 AM  Nursing: Elige Radon, RN    03/10/2015 9:30 AM  Nursing: Scarlette Slice, RN    03/10/2015 9:30 AM  Other:        03/10/2015 9:30 AM  Other:        03/10/2015 9:30 AM

## 2015-03-10 NOTE — BHH Group Notes (Signed)
[facility] Group Notes:  (Nursing/MHT/Case Management/Adjunct)  Date:  03/10/2015  Time:  2:56 PM  Type of Therapy:  Group Therapy  Participation Level:  Did Not Attend  Kaela Beitz De'Chelle Samit Sylve 03/10/2015, 2:56 PM

## 2015-03-10 NOTE — BHH Suicide Risk Assessment (Signed)
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 Major depressive disorder, recurrent severe without psychotic features (HCC) [F33.2] 11/07/2014  . Tobacco use disorder [F17.200] 11/07/2014  . Stimulant use disorder (HCC) crack cocaine [F15.90] 11/07/2014  . Alcohol use disorder, moderate, dependence (HCC) [F10.20] 11/07/2014  . CAD (coronary artery disease) [I25.10] 11/05/2014  . CVA (cerebral infarction) [I63.9] 10/19/2014  . LVH (left ventricular hypertrophy) [I51.7] 05/20/2013  . Obesity (BMI 30-39.9) [E66.9] 04/10/2013  . Aortic stenosis, mild [I35.0] 03/07/2013  . DM (diabetes mellitus), type 2, uncontrolled (HCC) [E11.65] 01/05/2013  . Essential hypertension, malignant [I10] 01/25/2009    Total Time spent with patient: 1 hour   Psychiatric Specialty Exam: ROS  Blood pressure 144/84, pulse 81, temperature 98.2 F (36.8 C), temperature source Oral, resp. rate 20, height  (1.803 m), weight 109.317 kg (241 lb), SpO2 97 %.Body mass index is 33.63 kg/(m^2).                                                       Mental Status Per Nursing Assessment::   On Admission:     Demographic Factors:  Male and Low socioeconomic status  Loss Factors: Decline in physical health  Historical Factors: Prior suicide attempts and Impulsivity  Risk Reduction Factors:   Stable living situation, stable income  Continued Clinical Symptoms:  Alcohol/Substance Abuse/Dependencies Previous Psychiatric Diagnoses and Treatments  Cognitive Features That Contribute To Risk:  Closed-mindedness    Suicide Risk:  Minimal: No identifiable suicidal ideation.  Patients presenting with no risk factors  but with morbid ruminations; may be classified as minimal risk based on the severity of the depressive symptoms  Follow-up Information    Follow up with [facility].   Why:  Please arrive to the walk-in clinic between the hours of 8am-2:30pm for an assessment for medication management and therapy.  Arrive as early as possible for prompt service.  Please call [facility] [place] at [phone] for questions and assistance.   Contact information:                  [facility] [address] [place] [address] Ph: [phone] Fax: [phone]       Jimmy Footman, MD 03/10/2015, 4:14 PM

## 2015-03-10 NOTE — Consult Note (Signed)
Reason for Consult:chest pain Referring Physician: dr. Earnest Rosier [name] [name]. is an 55 y.o. male.  HPI: Patient is a 55 year old male with past medical history of essential hypertension and diabetes mellitus, polysubstance abuse, coronary artery disease, status post stent, sees Dr. Gwen Pounds as an outpatient is admitted to behavioral health unit for depression. Hospitalist team is consulted as the patient is reporting chest pain. During my examination patient is in the TV room watching TV and eating graham crackers. Patient was given sublingual nitroglycerin and chest pain was resolved. Denies any shortness of breath but admits last cocaine 2 days ago. Denies any dizziness or loss of consciousness  Past Medical History  Diagnosis Date  . Hypertension   . Diabetes mellitus   . GERD (gastroesophageal reflux disease)   . Pericarditis   . Polysubstance abuse     cocaine, marijuana  . Auditory hallucinations   . Depression   . Hx of medication noncompliance   . History of suicide attempt   . Stroke [facility]) February 2015    Right MCA aneurysm; head MRI - acute infarct of the right frontal lobe. Smaller acute infarct of the left frontal lobe. Thought to be due to cerebral emboli. Left PICA infarct. Small chronic lacunar infarct right internal capsule. Chronic microvascular ischemic change with white matter change.  . Coronary artery disease     No reported catheterization. Negative Myoview stress test and 2013.  Marland Kitchen Syncope and collapse   . Bipolar disorder (HCC)   . Obesity, Class II, BMI 35-39.9, with comorbidity (HCC)   . Hypertensive hypertrophic cardiomyopathy [facility]) February 2015    Severe concentric LVH on echocardiogram.  . Aortic stenosis, mild February 2015    Past Surgical History  Procedure Laterality Date  . Pericardial fluid drainage    . Transthoracic echocardiogram  03/23/2013    EF 5560%. Elevated left atrial pressure. Severe concentric LVH. Mild aortic stenosis. --  Appearance of hypertensive hypertrophic heart disease.  No clear cardioembolic source  . Nm myoview ltd  July 2013    No ischemia or infarct. There is inferior wall defec suggestive of diaphragmatic attenuation. Mild LV dilation at to be due to hypertensive heart disease. EF 55%. Poor exercise tolerance.  . Carotid dopplers:  03/23/2013    Interval thickening of bilateral common carotid no significant plaque formation. Tortuosity but no significant stenosis. Patent bilateral vertebral  arteries    Family History  Problem Relation Age of Onset  . Stroke Mother   . Diabetes type II Mother   . Hypertension Mother   . Hypertension Father   . Arthritis Father   . Arthritis Sister   . Diabetes type II Sister   . Hypertension Sister     Social History:  reports that he has been smoking Cigars.  He has never used smokeless tobacco. He reports that he drinks about 25.8 oz of alcohol per week. He reports that he does not use illicit drugs.  Allergies: No Known Allergies  Medications: I have reviewed the patient's current medications.  Results for orders placed or performed during the hospital encounter of 03/09/15 (from the past 48 hour(s))  Hemoglobin A1c     Status: Abnormal   Collection Time: 03/09/15 11:44 AM  Result Value Ref Range   Hgb A1c MFr Bld 7.3 (H) 4.0 - 6.0 %  Lipid panel, fasting     Status: Abnormal   Collection Time: 03/09/15 11:44 AM  Result Value Ref Range   Cholesterol 113 0 - 200  mg/dL   Triglycerides 75 <161 mg/dL   HDL 29 (L) >09 mg/dL   Total CHOL/HDL Ratio 3.9 RATIO   VLDL 15 0 - 40 mg/dL   LDL Cholesterol 69 0 - 99 mg/dL    Comment:        Total Cholesterol/HDL:CHD Risk Coronary Heart Disease Risk Table                     Men   Women  1/2 Average Risk   3.4   3.3  Average Risk       5.0   4.4  2 X Average Risk   9.6   7.1  3 X Average Risk  23.4   11.0        Use the calculated Patient Ratio above and the CHD Risk Table to determine the patient's  CHD Risk.        ATP III CLASSIFICATION (LDL):  <100     mg/dL   Optimal  604-540  mg/dL   Near or Above                    Optimal  130-159  mg/dL   Borderline  981-191  mg/dL   High  >478     mg/dL   Very High   TSH     Status: None   Collection Time: 03/09/15 11:44 AM  Result Value Ref Range   TSH 0.557 0.350 - 4.500 uIU/mL  Troponin I (q 6hr x 3)     Status: Abnormal   Collection Time: 03/10/15 10:38 AM  Result Value Ref Range   Troponin I 0.04 (H) <0.031 ng/mL    Comment: READ BACK AND VERIFIED WITH PHYILLIS COBBS AT 1125 03/10/15 DAS        PERSISTENTLY INCREASED TROPONIN VALUES IN THE RANGE OF 0.04-0.49 ng/mL CAN BE SEEN IN:       -UNSTABLE ANGINA       -CONGESTIVE HEART FAILURE       -MYOCARDITIS       -CHEST TRAUMA       -ARRYHTHMIAS       -LATE PRESENTING MYOCARDIAL INFARCTION       -COPD   CLINICAL FOLLOW-UP RECOMMENDED.   [facility] only)     Status: None   Collection Time: 03/10/15 10:38 AM  Result Value Ref Range   CK, MB 3.4 0.5 - 5.0 ng/mL  Troponin I (q 6hr x 3)     Status: None   Collection Time: 03/10/15  2:36 PM  Result Value Ref Range   Troponin I 0.03 <0.031 ng/mL    Comment:        NO INDICATION OF MYOCARDIAL INJURY.     No results found.  ROS:  CONSTITUTIONAL: Denies fevers, chills. Denies any fatigue, weakness.  EYES: Denies blurry vision, double vision, eye pain. EARS, NOSE, THROAT: Denies tinnitus, ear pain, hearing loss. RESPIRATORY: Denies cough, wheeze, shortness of breath.  CARDIOVASCULAR: Reports generalized anterior wall chest pain, denies palpitations, edema.  GASTROINTESTINAL: Denies nausea, vomiting, diarrhea, abdominal pain. Denies bright red blood per rectum. GENITOURINARY: Denies dysuria, hematuria. ENDOCRINE: Denies nocturia or thyroid problems. HEMATOLOGIC AND LYMPHATIC: Denies easy bruising or bleeding. SKIN: Denies rash or lesion. MUSCULOSKELETAL: Denies pain in neck, back, shoulder, knees, hips or arthritic  symptoms.  NEUROLOGIC: Denies paralysis, paresthesias.  PSYCHIATRIC: Denies anxiety or depressive symptoms. Blood pressure 144/84, pulse 81, temperature 98.2 F (36.8 C), temperature source Oral, resp. rate 20, height 5\' 11"  (1.803  m), weight 109.317 kg (241 lb), SpO2 97 %.   PHYSICAL EXAMINATION:  GENERAL: Well-nourished, well-developed , currently in no acute distress.  HEAD: Normocephalic, atraumatic.  EYES: Pupils equal, round, and reactive to light. Extraocular muscles intact. No scleral icterus.  MOUTH: Moist mucosal membranes. Dentition intact. No abscess noted. EARS, NOSE, THROAT: Clear without exudates. No external lesions.  NECK: Supple. No thyromegaly. No nodules. No JVD.  PULMONARY: Clear to auscultation bilaterally without wheezes, rales, or rhonchi. No use of accessory muscles. Good respiratory effort. CHEST: Nontender to palpation.  CARDIOVASCULAR: S1, S2, regular rate and rhythm. No murmurs, rubs, or gallops.  GASTROINTESTINAL: Soft, nontender, nondistended. No masses. Positive bowel sounds. No hepatosplenomegaly. MUSCULOSKELETAL: No swelling, clubbing, edema. Range of motion full in all extremities. NEUROLOGIC: Cranial nerves II-XII intact. No gross focal neurological deficits. Sensation intact. Reflexes intact. SKIN: No ulcerations, lesions, rash, cyanosis. Skin warm, dry. Turgor intact. PSYCHIATRIC: Mood, affect within normal limits. Patient awake, alert, oriented x 3. Insight and judgment intact.   Assessment/Plan:   1. Atypical chest pain with history of cocaine abuse-ruled out acute MI Cycle cardiac biomarkers-initial troponin is 0.04, subsequent troponin is at 0.03 Patient's chest pain is resolved after sublingual nitroglycerin Instructed patient to stop abusing cocaine and other illicit drugs  outpatient follow-up with cardiology Dr. Gwen Pounds in 1 week after discharge  2. History of coronary artery disease status post stent Continue aspirin, Plavix, Lipitor  and lisinopril  3. History of diabetes mellitus Continue home medication metformin  4. Essential hypertension continue home medication clonidine and lisinopril and titrate as needed basis  5. Tobacco abuse disorder Counseled patient to quit smoking for 3-5 minutes. He verbalized understanding and will try to QUIT    Patient doesn't need to be transferred to medical floor at this time Thank you Dr. Ardyth Harps for allowing hospitalist team to take care of this patient medically  TOTAL TIME TAKING CARE OF THIS PATIENT: 45 minutes.  @ Pager - [phone] 03/10/2015, 3:54 PM

## 2015-03-10 NOTE — BHH Group Notes (Signed)
[facility] LCSW Aftercare Discharge Planning Group Note   03/10/2015 11:41 AM  Patient did not attend.  Jenel Lucks, CSW Intern 03/10/15

## 2015-03-10 NOTE — H&P (Addendum)
Psychiatric Admission Assessment Adult  Patient Identification: [name] [name]. 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 Major depressive disorder, recurrent severe without psychotic features (HCC) [F33.2] 11/07/2014  . Tobacco use disorder [F17.200] 11/07/2014  . Stimulant use disorder (HCC) crack cocaine [F15.90] 11/07/2014  . Alcohol use disorder, moderate, dependence (HCC) [F10.20] 11/07/2014  . CAD (coronary artery disease) [I25.10] 11/05/2014  . CVA (cerebral infarction) [I63.9] 10/19/2014  . LVH (left ventricular hypertrophy) [I51.7] 05/20/2013  . Obesity (BMI 30-39.9) [E66.9] 04/10/2013  . Aortic stenosis, mild [I35.0] 03/07/2013  . DM (diabetes mellitus), type 2, uncontrolled (HCC) [E11.65] 01/05/2013  . Essential hypertension, malignant [I10] 01/25/2009   History of Present Illness:  Patient is a 55 year old African-American male with history of cocaine use disorder, cardiovascular disease, cerebrovascular disease, diabetes hypertension and depression. Patient presented to our emergency Department on February 2nd reporting thoughts of wanting to shoot himself, he also reported complaints of having a headache and auditory hallucinations telling him to kill himself (he is started hearing these voices today after he used cocaine)  This patient was hospitalized in our unit back in October 2016. At that time he was transferred to [place] from the medical floor where he was hospitalized for chest pain. Patient was mixing cocaine with Viagra. The patient was diagnosed with depression and discharged on Zoloft. It was clear based on his laboratory results (hemoglobin A1c of 17) that he was  noncompliant with his medications. From our unit he was discharged to a assisted living facility.  The patient says he was there for only a month because it did not work out. He cannot elaborate as to the reasons for this. Since then the patient has been living in subsidized housing in [place]. Today he tells me he came into the hospital because he was tired of people around his apartment complex could keep making noise outside.   Patient says that on Tuesday of this week he drank alcohol heavily in use cocaine. He says that he has been taking all his medications. But denies seeing a psychiatrist since he left the hospital in October. He says that he has been going to [facility] for substance abuse classes.  Today he denies SI, HI. He says he continues to have voices that tell him to harm himself but denies any desire to want to hurt himself. He also reports seeing shadows. The patient reports decreased energy, poor concentration and appetite.  Patient says he is here so we can stabilize him and finding a new place to live.  Patient complained of chest pain this morning. Hospitalist service has been contacted and has been following up the patient. Troponins pending.   Substance abuse: started using crack at age 60 .  Now uses every month, last use Tuesday.  States he only drinks rarely, he last drank on Tuesday; he drank 1/2 bottle wine.  Smokes about 1/2 pack day.  Associated Signs/Symptoms: Depression Symptoms:  depressed mood, (Hypo) Manic Symptoms:  denies Anxiety Symptoms:  denies Psychotic Symptoms:  denies PTSD Symptoms: Negative Total Time spent with patient: 1 hour  Past Psychiatric History:  Patient was in our psychiatric facility back in October 2016. He was initially admitted on the medical floor for chest pain due to mixing by viagra  with cocaine. While on the medical floor the patient was voicing suicidal ideation.  He was transferred to the behavioral health unit   where patient was treated for depression and started on Zoloft.  Patient reports one prior suicidal attempt in the night is when he drank rat poison. He reports having 2 hospitalizations in our facility back in the 90s.He denies receiving any prior treatment for substance abuse. Denies any history of self-injurious behaviors.   Past Medical History:  Patient suffers from hypertension, diabetes. He had an cerebrovascular accident about a year ago.  He follows up with [facility]. He denies any history of surgeries. States that he has been compliant with his medications prior to admission.  Denies any history of seizures. He does report a history of head trauma at home as he has fallen frequently and at times have lost consciousness.   1) chest pain back in Oct 2016 Cardiac catheterization 2015 given his history of chronic chest pain with minimal coronary artery disease of a ramus branch.  2) malignant hypertension Patient compliance in question Blood pressure has improved on outpatient medication regimen   3) diabetes type 2, poorly controlled Last HbA1c was 17 Suspect that patient compliance is an issue  5) history of stroke Likely secondary to chronic drug abuse   Would continue low-dose aspirin  6) LVH Likely secondary to chronic hypertension.  Past Medical History  Diagnosis Date  . Hypertension   . Diabetes mellitus   . GERD (gastroesophageal reflux disease)   . Pericarditis   . Polysubstance abuse     cocaine, marijuana  . Auditory hallucinations   . Depression   . Hx of medication noncompliance   . History of suicide attempt   . Stroke [facility]) February 2015    Right MCA aneurysm; head MRI - acute infarct of the right frontal lobe. Smaller acute infarct of the left frontal lobe. Thought to be due to cerebral emboli. Left PICA infarct. Small chronic lacunar infarct right internal capsule. Chronic microvascular ischemic change with white matter change.  .  Coronary artery disease     No reported catheterization. Negative Myoview stress test and 2013.  Marland Kitchen Syncope and collapse   . Bipolar disorder (HCC)   . Obesity, Class II, BMI 35-39.9, with comorbidity (HCC)   . Hypertensive hypertrophic cardiomyopathy [facility]) February 2015    Severe concentric LVH on echocardiogram.  . Aortic stenosis, mild February 2015    Past Surgical History  Procedure Laterality Date  . Pericardial fluid drainage    . Transthoracic echocardiogram  03/23/2013    EF 5560%. Elevated left atrial pressure. Severe concentric LVH. Mild aortic stenosis. -- Appearance of hypertensive hypertrophic heart disease.  No clear cardioembolic source  . Nm myoview ltd  July 2013    No ischemia or infarct. There is inferior wall defec suggestive of diaphragmatic attenuation. Mild LV dilation at to be due to hypertensive heart disease. EF 55%. Poor exercise tolerance.  . Carotid dopplers:  03/23/2013    Interval thickening of bilateral common carotid no significant plaque formation. Tortuosity but no significant stenosis. Patent bilateral vertebral  arteries   Family History:  Family History  Problem Relation Age of Onset  . Stroke Mother   . Diabetes type II Mother   . Hypertension Mother   . Hypertension Father   . Arthritis Father   . Arthritis Sister   . Diabetes type II Sister   . Hypertension Sister    Family Psychiatric  History: Patient  reports that his father was an alcoholic. There is no history of suicides in his family. He thinks one of his cousins suffers from bipolar or schizophrenia  Social History: Currently living in subsidize housing.Patient is single, never married and has 3 children ages 104, 43 and 46 year old but he is not close to any of them. The patient has been receiving SSI for the last 8 years. He also has Medicaid. In the past he used to work as a Merchandiser, retail. He was placed on SSI due to his mental illness and medical problems. He has a 10th grade  education. As far as his legal history he reports that he had raped charges in the 29s. He used to be a registered sex offender but states that he was then removed from the list as this was allowed back then. Patient also had prior charges for DWIs. History  Alcohol Use  . 25.8 oz/week  . 3 Glasses of wine, 40 Cans of beer per week    Comment: occasionally; reports 1 pint of vodka per month at paydaysocially     History  Drug Use No    Comment: past use; denies current; UDS positive for cocaine     Allergies:  No Known Allergies   Lab Results:  Results for orders placed or performed during the hospital encounter of 03/09/15 (from the past 48 hour(s))  Lipid panel, fasting     Status: Abnormal   Collection Time: 03/09/15 11:44 AM  Result Value Ref Range   Cholesterol 113 0 - 200 mg/dL   Triglycerides 75 <161 mg/dL   HDL 29 (L) >09 mg/dL   Total CHOL/HDL Ratio 3.9 RATIO   VLDL 15 0 - 40 mg/dL   LDL Cholesterol 69 0 - 99 mg/dL    Comment:        Total Cholesterol/HDL:CHD Risk Coronary Heart Disease Risk Table                     Men   Women  1/2 Average Risk   3.4   3.3  Average Risk       5.0   4.4  2 X Average Risk   9.6   7.1  3 X Average Risk  23.4   11.0        Use the calculated Patient Ratio above and the CHD Risk Table to determine the patient's CHD Risk.        ATP III CLASSIFICATION (LDL):  <100     mg/dL   Optimal  604-540  mg/dL   Near or Above                    Optimal  130-159  mg/dL   Borderline  981-191  mg/dL   High  >478     mg/dL   Very High   TSH     Status: None   Collection Time: 03/09/15 11:44 AM  Result Value Ref Range   TSH 0.557 0.350 - 4.500 uIU/mL  Troponin I (q 6hr x 3)     Status: Abnormal   Collection Time: 03/10/15 10:38 AM  Result Value Ref Range   Troponin I 0.04 (H) <0.031 ng/mL    Comment: READ BACK AND VERIFIED WITH PHYILLIS COBBS AT 1125 03/10/15 DAS        PERSISTENTLY INCREASED TROPONIN VALUES IN THE RANGE OF  0.04-0.49 ng/mL CAN BE SEEN IN:       -UNSTABLE ANGINA       -  CONGESTIVE HEART FAILURE       -MYOCARDITIS       -CHEST TRAUMA       -ARRYHTHMIAS       -LATE PRESENTING MYOCARDIAL INFARCTION       -COPD   CLINICAL FOLLOW-UP RECOMMENDED.   [facility] only)     Status: None   Collection Time: 03/10/15 10:38 AM  Result Value Ref Range   CK, MB 3.4 0.5 - 5.0 ng/mL    Metabolic Disorder Labs:  Lab Results  Component Value Date   HGBA1C 10.1* 03/23/2013   MPG 232* 01/05/2013   MPG 263* 01/09/2012   No results found for: PROLACTIN Lab Results  Component Value Date   CHOL 113 03/09/2015   TRIG 75 03/09/2015   HDL 29* 03/09/2015   CHOLHDL 3.9 03/09/2015   VLDL 15 03/09/2015   LDLCALC 69 03/09/2015   LDLCALC 72 10/21/2014    Current Medications: Current Facility-Administered Medications  Medication Dose Route Frequency Provider Last Rate Last Dose  . acetaminophen (TYLENOL) tablet 650 mg  650 mg Oral Q6H PRN Audery Amel, MD   650 mg at 03/10/15 0004  . alum & mag hydroxide-simeth (MAALOX/MYLANTA) 200-200-20 MG/5ML suspension 30 mL  30 mL Oral Q4H PRN Audery Amel, MD      . amLODipine (NORVASC) tablet 10 mg  10 mg Oral Daily Audery Amel, MD   10 mg at 03/10/15 0912  . aspirin EC tablet 81 mg  81 mg Oral Daily Audery Amel, MD   81 mg at 03/10/15 0911  . atorvastatin (LIPITOR) tablet 20 mg  20 mg Oral q1800 Audery Amel, MD      . chlorthalidone (HYGROTON) tablet 25 mg  25 mg Oral Daily Audery Amel, MD   25 mg at 03/10/15 0912  . cloNIDine (CATAPRES) tablet 0.2 mg  0.2 mg Oral BID Audery Amel, MD   0.2 mg at 03/10/15 0912  . clopidogrel (PLAVIX) tablet 75 mg  75 mg Oral Daily Audery Amel, MD   75 mg at 03/10/15 0912  . insulin aspart (novoLOG) injection 0-5 Units  0-5 Units Subcutaneous QHS Jimmy Footman, MD      . insulin aspart (novoLOG) injection 0-9 Units  0-9 Units Subcutaneous TID WC Jimmy Footman, MD      . insulin aspart  (novoLOG) injection 10 Units  10 Units Subcutaneous BID AC Audery Amel, MD      . lisinopril (PRINIVIL,ZESTRIL) tablet 40 mg  40 mg Oral Daily Audery Amel, MD   40 mg at 03/10/15 0912  . magnesium hydroxide (MILK OF MAGNESIA) suspension 30 mL  30 mL Oral Daily PRN Audery Amel, MD      . metFORMIN (GLUCOPHAGE) tablet 500 mg  500 mg Oral BID WC Audery Amel, MD   500 mg at 03/10/15 0912  . nicotine (NICODERM CQ - dosed in mg/24 hours) patch 21 mg  21 mg Transdermal Daily Jimmy Footman, MD      . nitroGLYCERIN (NITROSTAT) SL tablet 0.4 mg  0.4 mg Sublingual Q5 min PRN Jimmy Footman, MD   0.4 mg at 03/10/15 1115  . pantoprazole (PROTONIX) EC tablet 40 mg  40 mg Oral QAC breakfast Audery Amel, MD   40 mg at 03/10/15 0912  . [START ON 03/11/2015] pneumococcal 23 valent vaccine (PNU-IMMUNE) injection 0.5 mL  0.5 mL Intramuscular Tomorrow-1000 Jimmy Footman, MD      . sertraline (ZOLOFT) tablet 50 mg  50 mg Oral Daily Audery Amel, MD   50 mg at 03/10/15 0912  . traZODone (DESYREL) tablet 150 mg  150 mg Oral QHS Audery Amel, MD       PTA Medications: Prescriptions prior to admission  Medication Sig Dispense Refill Last Dose  . amLODipine (NORVASC) 10 MG tablet Take 1 tablet (10 mg total) by mouth daily. 30 tablet 6 unknown at unknown  . aspirin EC 81 MG EC tablet Take 1 tablet (81 mg total) by mouth daily. 30 tablet 0 03/09/2015 at 0800  . atorvastatin (LIPITOR) 20 MG tablet Take 1 tablet (20 mg total) by mouth daily at 6 PM. 30 tablet 6 unknown at unknown  . butalbital-acetaminophen-caffeine (FIORICET) 50-325-40 MG tablet Take 1-2 tablets by mouth every 6 (six) hours as needed for headache. 20 tablet 0 Past Month at Unknown time  . chlorthalidone (HYGROTON) 25 MG tablet Take 1 tablet (25 mg total) by mouth daily. 30 tablet 0 unknwon at unknown  . cloNIDine (CATAPRES) 0.2 MG tablet Take 1 tablet (0.2 mg total) by mouth 2 (two) times daily. 60 tablet 6  Past Month at Unknown time  . clopidogrel (PLAVIX) 75 MG tablet Take 1 tablet (75 mg total) by mouth daily. 30 tablet 6 unknown at unknown  . diclofenac sodium (VOLTAREN) 1 % GEL Apply 2 g topically 4 (four) times daily.   unknown at unknown  . hydrOXYzine (ATARAX/VISTARIL) 25 MG tablet Take 1 tablet (25 mg total) by mouth 3 (three) times daily as needed (anxiety). 90 tablet 0 unknown at unknown  . insulin aspart (NOVOLOG) 100 UNIT/ML injection Inject 10 Units into the skin 2 (two) times daily with a meal. 30 mL 0 unknown at unknown  . lisinopril (PRINIVIL,ZESTRIL) 40 MG tablet Take 1 tablet (40 mg total) by mouth daily. 30 tablet 6 unknown at unknown  . metFORMIN (GLUCOPHAGE) 500 MG tablet Take 1 tablet (500 mg total) by mouth 2 (two) times daily before a meal. 60 tablet 0 unknown at unknown  . pantoprazole (PROTONIX) 40 MG tablet Take 1 tablet (40 mg total) by mouth daily. 30 tablet 0 unknown at unknown  . sertraline (ZOLOFT) 100 MG tablet Take 0.5 tablets (50 mg total) by mouth daily. 30 tablet 0 unknown at unknown  . tadalafil (CIALIS) 20 MG tablet Take 1 tablet (20 mg total) by mouth daily as needed for erectile dysfunction. 3 tablet 5 unknown at unknown  . traZODone (DESYREL) 150 MG tablet Take 1 tablet (150 mg total) by mouth at bedtime. 30 tablet 0 unknown at unknown    Musculoskeletal: Strength & Muscle Tone: within normal limits Gait & Station: normal Patient leans: N/A  Psychiatric Specialty Exam: Physical Exam  Constitutional: He is oriented to person, place, and time. He appears well-developed and well-nourished.  HENT:  Head: Normocephalic and atraumatic.  Eyes: Conjunctivae and EOM are normal.  Neck: Normal range of motion. Neck supple.  Respiratory: Effort normal.  Musculoskeletal: Normal range of motion.  Neurological: He is alert and oriented to person, place, and time.  Skin: Skin is warm and dry.    Review of Systems  Constitutional: Negative.   HENT: Negative.    Eyes: Negative.   Respiratory: Negative.   Cardiovascular: Positive for chest pain.  Gastrointestinal: Negative.   Genitourinary: Negative.   Musculoskeletal: Negative.   Skin: Negative.   Neurological: Negative.   Endo/Heme/Allergies: Negative.   Psychiatric/Behavioral: Negative.     Blood pressure 174/108, pulse 72, temperature 98.2 F (36.8 C),  temperature source Oral, resp. rate 20, height 5\' 11"  (1.803 m), weight 109.317 kg (241 lb), SpO2 96 %.Body mass index is 33.63 kg/(m^2).  General Appearance: Fairly Groomed  Patent attorney::  Good  Speech:  Slurred  Volume:  Normal  Mood:  Dysphoric  Affect:  Appropriate  Thought Process:  Logical  Orientation:  Full (Time, Place, and Person)  Thought Content:  Hallucinations: Auditory  Suicidal Thoughts:  No  Homicidal Thoughts:  No  Memory:  Immediate;   Good Recent;   Good Remote;   Good  Judgement:  Poor  Insight:  Shallow  Psychomotor Activity:  Normal  Concentration:  Good  Recall:  Good  Fund of Knowledge:Fair  Language: Fair  Akathisia:  No  Handed:    AIMS (if indicated):     Assets:  Architect Housing  ADL's:  Intact  Cognition: WNL  Sleep:  Number of Hours: 5     Treatment Plan Summary: Daily contact with patient to assess and evaluate symptoms and progress in treatment and Medication management   55 year old single African-American male with a multitude of chronic medical conditions (diabetes, hypertension, cerebrovascular disease) along with history of substance abuse and depression. Patient presents to our emergency department complaining of suicidal ideation and hallucinations.  When I asked the patient today how could we help him he is stated that wanted to be stabilized and wanting [place] to help him find a new place to live.  Major depressive disorder: I will continue the patient on sertraline 50 mg by mouth daily. He needs to follow-up with outpatient psychiatry. I  suspect current symptoms of depression are secondary to substance abuse and unhappiness with current living situation.  Insomnia: Continue the patient on trazodone 150 mg by mouth daily at bedtime.   Alcohol use disorder-cocaine use disorder: Patient will be referred for outpatient substance abuse treatment once discharged.  No evidence of alcohol withdrawal during his stay in the hospital  Chest pain: c/o pain today.  Hospitalist contacted.  Troponins and EKG requested. Pt received 2 NTG.  Diabetes: Continue  NovoLog 10 units twice a day with meals and metformin 500 mg by mouth twice a day.  hemoglobin A1c was 10.1 improve as his last hemoglobin A1c was 17 back in October  Hypertension: Continue chlorthalidone 25 mg a day, Norvasc 10 mg a day, lisinopril 40 mg a day and clonidine 0.2 mg bid.  Blood pressure is elevated. I suspect patient has not been compliant.  Hyperlipidemia: Patient will be continued on Lipitor 20 mg daily  Cardiovascular disease and cerebrovascular disease: Continue aspirin 81 mg.  GERD: Continue the patient on Protonix 40 mg a day  Tobacco use disorder: Patient will be started on a nicotine patch 21 mg a day  Discharge disposition: During his last hospitalizations back in October  this patient was discharged from our unit to a assisted living facility. He unfortunately left after 1 month.  Patient has been in our hospital 9  times over the last 11 months.  he has a history of noncompliance and significant issues with substance abuse. Unfortunately this patient has not compliant with recommendations for intensive outpatient substance abuse.  We'll plan to discharge this patient back to his home. We will set up follow-up with [facility].  Possible d/c in the next 24 h  Labs: CK, Troponins,   EKG.  >90 minutes.  I reviewed records from prior hospitalization in psychiatry in prior records from [place] to the emergency department. I consulted  the hospitalist as patient was  having chest pain. EKG, troponins were ordered.  I certify that inpatient services furnished can reasonably be expected to improve the patient's condition.    Jimmy Footman, MD 2/3/201712:59 PM

## 2015-03-10 NOTE — [facility] Suicide Risk Assessment (Signed)
[facility] INPATIENT:  Family/Significant Other Suicide Prevention Education  Suicide Prevention Education:  Patient Refusal for Family/Significant Other Suicide Prevention Education: The patient [name] [name]. has refused to provide written consent for family/significant other to be provided Family/Significant Other Suicide Prevention Education during admission and/or prior to discharge.  Physician notified.  Pt refused SPE from the CSW.  Dorothe Pea Tanda Morrissey 03/10/2015, 4:38 PM

## 2015-03-10 NOTE — Progress Notes (Signed)
  [facility] Adult Case Management Discharge Plan :  Will you be returning to the same living situation after discharge:  Yes,  pt will be returning to his home in [place], [place] At discharge, do you have transportation home?: Yes,  pt will be provided with a bus pass Do you have the ability to pay for your medications: Yes,  pt will be provided with prescriptions at discharge  Release of information consent forms completed and in the chart;  Patient's signature needed at discharge.  Patient to Follow up at: Follow-up Information    Follow up with [facility].   Why:  Please arrive to the walk-in clinic between the hours of 8am-2:30pm for your assessment for medication managment and therapy.  Arrive as early as possible for prompt service.    Contact information:   [address] [place], [place] [address] Phone: [phone] Fax: [phone]      Next level of care provider has access to [facility] Link:no    Safety Planning and Suicide Prevention discussed: No. Pt refused SPE  Have you used any form of tobacco in the last 30 days? (Cigarettes, Smokeless Tobacco, Cigars, and/or Pipes): Yes  Has patient been referred to the Quitline?: Patient refused referral  Patient has been referred for addiction treatment: Yes  Dorothe Pea Thu Baggett 03/10/2015, 5:13 PM

## 2015-03-10 NOTE — [facility] Suicide Risk Assessment (Signed)
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 Major depressive disorder, recurrent severe without psychotic features (HCC) [F33.2] 11/07/2014  . Tobacco use disorder [F17.200] 11/07/2014  . Stimulant use disorder (HCC) crack cocaine [F15.90] 11/07/2014  . Alcohol use disorder, moderate, dependence (HCC) [F10.20] 11/07/2014  . CAD (coronary artery disease) [I25.10] 11/05/2014  . CVA (cerebral infarction) [I63.9] 10/19/2014  . LVH (left ventricular hypertrophy) [I51.7] 05/20/2013  . Obesity (BMI 30-39.9) [E66.9] 04/10/2013  . Aortic stenosis, mild [I35.0] 03/07/2013  . DM (diabetes mellitus), type 2, uncontrolled (HCC) [E11.65] 01/05/2013  . Essential hypertension, malignant [I10] 01/25/2009   Subjective Data:   Continued Clinical Symptoms:  Alcohol Use Disorder Identification Test Final Score (AUDIT): 14 The "Alcohol Use Disorders Identification Test", Guidelines for Use in Primary Care, Second Edition.  World Science writer [facility]). Score between 0-7:  no or low risk or alcohol related problems. Score between 8-15:  moderate risk of alcohol related problems. Score between 16-19:  high risk of alcohol related problems. Score 20 or above:  warrants further diagnostic evaluation for alcohol dependence and treatment.   CLINICAL FACTORS:   Alcohol/Substance Abuse/Dependencies Previous Psychiatric Diagnoses and Treatments Medical Diagnoses and Treatments/Surgeries    Psychiatric Specialty Exam: ROS                                                           COGNITIVE FEATURES THAT CONTRIBUTE TO RISK:  None    SUICIDE RISK:   Minimal: No identifiable suicidal ideation.  Patients presenting with no risk factors but with morbid ruminations; may be classified as minimal risk based on the severity of the depressive symptoms  PLAN OF CARE: d/c home today  I certify that inpatient services furnished can reasonably be expected to improve the patient's condition.   Jimmy Footman, MD 03/10/2015, 5:31 PM

## 2015-03-10 NOTE — Progress Notes (Signed)
Denies SI but endorses depression.  States that he is here because started doing crack again.  Denies AVH. 2 separate episodes of Chest pain.  Nitroglycerine received with good results.  Hospitalist down to see.  Discharge instructions given.  Verbalized understanding.  Prescription given.  Personal belongings returned.  Escorted off unit by this Clinical research associate to meet security to take to bus stop.
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
## 2015-03-10 NOTE — Tx Team (Signed)
Initial Interdisciplinary Treatment Plan   PATIENT STRESSORS: Health problems Substance abuse   PATIENT STRENGTHS: Ability for insight Capable of independent living Motivation for treatment/growth   PROBLEM LIST: Problem List/Patient Goals Date to be addressed Date deferred Reason deferred Estimated date of resolution  "Help me get my head together" 03/10/2015     "help with voices" / suicidal ideation 03/10/2015                                                DISCHARGE CRITERIA:  Improved stabilization in mood, thinking, and/or behavior  PRELIMINARY DISCHARGE PLAN: Outpatient therapy  PATIENT/FAMIILY INVOLVEMENT: This treatment plan has been presented to and reviewed with the patient, [name] Michaell Cowing., and/or family member.  The patient and family have been given the opportunity to ask questions and make suggestions.  [name] [name] 03/10/2015, 1:03 AM

## 2015-03-10 NOTE — Progress Notes (Signed)
Recreation Therapy Notes  INPATIENT RECREATION THERAPY ASSESSMENT  Patient Details Name: Carliss Quast. MRN: [id number] DOB: July 28, 1960 Today's Date: 2015/04/08  Patient Stressors: Death, Other (Comment) (Cousin died a couple months ago; living condition - his apartment is in a bad area)  Coping Skills:   Isolate, Substance Abuse, Avoidance, Exercise, Art/Dance, Music, Sports, Other (Comment) (Substance abuse class, church)  Personal Challenges: Concentration, Substance Abuse  Leisure Interests (2+):  Individual - Other (Comment) (Sing, play sports, go to church)  Awareness of Community Resources:  Yes  Community Resources:  [employer]  Current Use: Yes  If no, Barriers?:    Patient Strengths:  Attitude, faith in God  Patient Identified Areas of Improvement:  Saving money  Current Recreation Participation:  Going to church  Patient Goal for Hospitalization:  To get health better  [place] of Residence:  [place] of Residence:  [place]   Current SI (including self-harm):  Yes  Current HI:  No  Consent to Intern Participation: N/A   Jacquelynn Cree, LRT/CTRS Apr 08, 2015, 2:52 PM

## 2015-03-10 NOTE — [facility] Group Notes (Signed)
[facility] LCSW Group Therapy  03/10/2015 2:47 PM  Type of Therapy:  Group Therapy  Participation Level:  Did Not Attend  Modes of Intervention:  Discussion, Education, Socialization and Support  Summary of Progress/Problems: Feelings around Relapse. Group members discussed the meaning of relapse and shared personal stories of relapse, how it affected them and others, and how they perceived themselves during this time. Group members were encouraged to identify triggers, warning signs and coping skills used when facing the possibility of relapse. Social supports were discussed and explored in detail.   [name] [name] L Ember Gottwald  MSW, LCSWA   03/10/2015, 2:47 PM  

## 2015-03-10 NOTE — Progress Notes (Signed)
Recreation Therapy Notes  Date: 02.03.17 Time: 3:00 pm Location: Craft Room  Group Topic: Communication, Problem Solving, Teamwork  Goal Area(s) Addresses:  Patient will work in teams towards shared goal. Patient will verbalize skills needed to make activities successful. Patient will verbalize benefit of using skills identify to reach post d/c goals.  Behavioral Response: Did not attend  Intervention: Landing Pad  Activity: Patients were given 15 straws and about 2.5-3 feet of tape and instructed to build a contraption to catch a golf ball dropped from about 4 feet.  Education: LRT educated patients on why these skills are important.  Education Outcome: Patient did not attend group.  Clinical Observations/Feedback: Patient did not attend group.  Jacquelynn Cree, LRT/CTRS 03/10/2015 4:42 PM

## 2015-03-10 NOTE — Discharge Summary (Signed)
Physician Discharge Summary Note  Patient:  [name] [name]. is an 55 y.o., male MRN:  [id number] DOB:  September 22, 1960 Patient phone:  [phone] (home)  Patient address:   [address]  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 Major depressive disorder, recurrent severe without psychotic features (HCC) [F33.2] 11/07/2014  . Tobacco use disorder [F17.200] 11/07/2014  . Stimulant use disorder (HCC) crack cocaine [F15.90] 11/07/2014  . Alcohol use disorder, moderate, dependence (HCC) [F10.20] 11/07/2014  . CAD (coronary artery disease) [I25.10] 11/05/2014  . CVA (cerebral infarction) [I63.9] 10/19/2014  . LVH (left ventricular hypertrophy) [I51.7] 05/20/2013  . Obesity (BMI 30-39.9) [E66.9] 04/10/2013  . Aortic stenosis, mild [I35.0] 03/07/2013  . DM (diabetes mellitus), type 2, uncontrolled (HCC) [E11.65] 01/05/2013  . Essential hypertension, malignant [I10] 01/25/2009   History of Present Illness:  Patient is a 55 year old African-American male with history of cocaine use disorder, cardiovascular disease, cerebrovascular disease, diabetes hypertension and depression. Patient presented to our emergency Department on February 2nd reporting thoughts of wanting to shoot himself, he also reported complaints of having a headache and auditory hallucinations telling him to kill himself (he is started hearing these voices today after he used cocaine)  This patient was hospitalized in our unit back in October 2016. At that time he was transferred to [place] from the medical floor where he was  hospitalized for chest pain. Patient was mixing cocaine with Viagra. The patient was diagnosed with depression and discharged on Zoloft. It was clear based on his laboratory results (hemoglobin A1c of 17) that he was noncompliant with his medications. From our unit he was discharged to a assisted living facility. The patient says he was there for only a month because it did not work out. He cannot elaborate as to the reasons for this. Since then the patient has been living in subsidized housing in [place]. Today he tells me he came into the hospital because he was tired of people around his apartment complex could keep making noise outside.   Patient says that on Tuesday of this week he drank alcohol heavily in use cocaine. He says that he has been taking all his medications. But denies seeing a psychiatrist since he left the hospital in October. He says that he has been going to [employer] for substance abuse classes.  Today he denies SI, HI. He says he continues to have voices that tell him to harm himself but denies any desire to want to hurt himself. He also reports seeing shadows. The patient reports decreased energy, poor concentration and appetite.  Patient says he is here so we can stabilize him and finding a new place to live.  Patient complained of chest pain this morning. Hospitalist service has been contacted and has been following up the patient. Troponins pending.   Substance abuse: started using crack at age 16 . Now uses every month, last use Tuesday. States he only drinks rarely, he last drank on Tuesday; he drank 1/2 bottle wine. Smokes about 1/2 pack day.  Associated Signs/Symptoms: Depression Symptoms: depressed mood, (Hypo) Manic Symptoms: denies Anxiety Symptoms: denies Psychotic Symptoms: denies PTSD Symptoms: Negative Total Time spent  with patient: 1 hour  Past Psychiatric History:  Patient was in our psychiatric facility back in October 2016. He was  initially admitted on the medical floor for chest pain due to mixing by viagra with cocaine. While on the medical floor the patient was voicing suicidal ideation. He was transferred to the behavioral health unit where patient was treated for depression and started on Zoloft.  Patient reports one prior suicidal attempt in the night is when he drank rat poison. He reports having 2 hospitalizations in our facility back in the 90s.He denies receiving any prior treatment for substance abuse. Denies any history of self-injurious behaviors.   Past Medical History:  Patient suffers from hypertension, diabetes. He had an cerebrovascular accident about a year ago. He follows up with [facility]. He denies any history of surgeries. States that he has been compliant with his medications prior to admission. Denies any history of seizures. He does report a history of head trauma at home as he has fallen frequently and at times have lost consciousness.   1) chest pain back in Oct 2016 Cardiac catheterization 2015 given his history of chronic chest pain with minimal coronary artery disease of a ramus branch.  2) malignant hypertension Patient compliance in question Blood pressure has improved on outpatient medication regimen   3) diabetes type 2, poorly controlled Last HbA1c was 17 Suspect that patient compliance is an issue  5) history of stroke Likely secondary to chronic drug abuse  Would continue low-dose aspirin  6) LVH Likely secondary to chronic hypertension.  Past Medical History  Diagnosis Date  . Hypertension   . Diabetes mellitus   . GERD (gastroesophageal reflux disease)   . Pericarditis   . Polysubstance abuse     cocaine, marijuana  . Auditory hallucinations   . Depression   . Hx of medication noncompliance   . History of suicide attempt   . Stroke [facility]) February 2015    Right MCA aneurysm; head MRI - acute infarct of  the right frontal lobe. Smaller acute infarct of the left frontal lobe. Thought to be due to cerebral emboli. Left PICA infarct. Small chronic lacunar infarct right internal capsule. Chronic microvascular ischemic change with white matter change.  . Coronary artery disease     No reported catheterization. Negative Myoview stress test and 2013.  Marland Kitchen Syncope and collapse   . Bipolar disorder (HCC)   . Obesity, Class II, BMI 35-39.9, with comorbidity (HCC)   . Hypertensive hypertrophic cardiomyopathy [facility]) February 2015    Severe concentric LVH on echocardiogram.  . Aortic stenosis, mild February 2015    Past Surgical History  Procedure Laterality Date  . Pericardial fluid drainage    . Transthoracic echocardiogram  03/23/2013    EF 5560%. Elevated left atrial pressure. Severe concentric LVH. Mild aortic stenosis. -- Appearance of hypertensive hypertrophic heart disease. No clear cardioembolic source  . Nm myoview ltd  July 2013    No ischemia or infarct. There is inferior wall defec suggestive of diaphragmatic attenuation. Mild LV dilation at to be due to hypertensive heart disease. EF 55%. Poor exercise tolerance.  . Carotid dopplers:  03/23/2013    Interval thickening of bilateral common carotid no significant plaque formation. Tortuosity but no significant stenosis. Patent bilateral vertebral arteries   Family History:  Family History  Problem Relation Age of Onset  . Stroke Mother   . Diabetes type II Mother   . Hypertension Mother   . Hypertension Father   .  Arthritis Father   . Arthritis Sister   . Diabetes type II Sister   . Hypertension Sister    Family Psychiatric History: Patient reports that his father was an alcoholic. There is no history of suicides in his family. He thinks one of his cousins suffers from bipolar or schizophrenia  Social History: Currently living in subsidize  housing.Patient is single, never married and has 3 children ages 43, 87 and 28 year old but he is not close to any of them. The patient has been receiving SSI for the last 8 years. He also has Medicaid. In the past he used to work as a Merchandiser, retail. He was placed on SSI due to his mental illness and medical problems. He has a 10th grade education. As far as his legal history he reports that he had raped charges in the 54s. He used to be a registered sex offender but states that he was then removed from the list as this was allowed back then. Patient also had prior charges for DWIs. History  Alcohol Use  . 25.8 oz/week  . 3 Glasses of wine, 40 Cans of beer per week    Comment: occasionally; reports 1 pint of vodka per month at paydaysocially    History  Drug Use No    Comment: past use; denies current; UDS positive for cocaine     Allergies: No Known Allergies         Hospital Course:    55 year old single African-American male with a multitude of chronic medical conditions (diabetes, hypertension, cerebrovascular disease) along with history of substance abuse and depression. Patient presents to our emergency department complaining of suicidal ideation and hallucinations.  When I asked the patient today how could we help him he is stated that wanted to be stabilized and wanting [place] to help him find a new place to live.  Major depressive disorder: I will continue the patient on sertraline 50 mg by mouth daily. He needs to follow-up with outpatient psychiatry. I suspect current symptoms of depression are secondary to substance abuse and unhappiness with current living situation.  Insomnia: Continue the patient on trazodone 150 mg by mouth daily at bedtime.  Alcohol use disorder-cocaine use disorder: Patient will be referred for outpatient substance abuse treatment once discharged. No evidence of alcohol withdrawal during his stay in the hospital  Chest pain: c/o pain  today. Hospitalist contacted. Troponins and EKG requested. Pt received 2 NTG. Spoke with the hospitalist after reviewing troponins. First appointment was mildly elevated but second troponin was not CK M.D. was not elevated. This point the hospital stays there is no intervention indicated.  Diabetes: Continue NovoLog 10 units twice a day with meals and metformin 500 mg by mouth twice a day. hemoglobin A1c was 10.1 improve as his last hemoglobin A1c was 17 back in October  Hypertension: Continue chlorthalidone 25 mg a day, Norvasc 10 mg a day, lisinopril 40 mg a day and clonidine 0.2 mg bid. Blood pressure is elevated. I suspect patient has not been compliant.  Hyperlipidemia: Patient will be continued on Lipitor 20 mg daily  Cardiovascular disease and cerebrovascular disease: Continue aspirin 81 mg.  GERD: Continue the patient on Protonix 40 mg a day  Tobacco use disorder: Patient will be started on a nicotine patch 21 mg a day  Discharge disposition: During his last hospitalizations back in October this patient was discharged from our unit to a assisted living facility. He unfortunately left after 1 month. Patient has been in our hospital  9 times over the last 11 months. he has a history of noncompliance and significant issues with substance abuse. Unfortunately this patient has not compliant with recommendations for intensive outpatient substance abuse. We'll plan to discharge this patient back to his home. We will set up follow-up with [facility].  Patient was given a list of assisted living facility was for him to content. He also was given a copy of his PSARR number which was obtained for him during his last admission.  On admission patient was calm, pleasant and cooperative. He denied suicidality, homicidality or having auditory or visual hallucinations.  Physical Findings: AIMS: Facial and Oral Movements Muscles of Facial Expression: None, normal Lips and Perioral Area: None,  normal Jaw: None, normal Tongue: None, normal,Extremity Movements Upper (arms, wrists, hands, fingers): None, normal Lower (legs, knees, ankles, toes): None, normal, Trunk Movements Neck, shoulders, hips: None, normal, Overall Severity Severity of abnormal movements (highest score from questions above): None, normal Incapacitation due to abnormal movements: None, normal Patient's awareness of abnormal movements (rate only patient's report): No Awareness, Dental Status Current problems with teeth and/or dentures?: No Does patient usually wear dentures?: No  CIWA:    COWS:      Psychiatric Specialty Exam: ROS                                                         Have you used any form of tobacco in the last 30 days? (Cigarettes, Smokeless Tobacco, Cigars, and/or Pipes): Yes  Has this patient used any form of tobacco in the last 30 days? (Cigarettes, Smokeless Tobacco, Cigars, and/or Pipes) Yes, Yes, A prescription for an FDA-approved tobacco cessation medication was offered at discharge and the patient refused  Metabolic Disorder Labs:  Lab Results  Component Value Date   HGBA1C 7.3* 03/09/2015   MPG 232* 01/05/2013   MPG 263* 01/09/2012   No results found for: PROLACTIN Lab Results  Component Value Date   CHOL 113 03/09/2015   TRIG 75 03/09/2015   HDL 29* 03/09/2015   CHOLHDL 3.9 03/09/2015   VLDL 15 03/09/2015   LDLCALC 69 03/09/2015   LDLCALC 72 10/21/2014    See Psychiatric Specialty Exam and Suicide Risk Assessment completed by Attending Physician prior to discharge.  Discharge destination:  Home  Is patient on multiple antipsychotic therapies at discharge:  No   Has Patient had three or more failed trials of antipsychotic monotherapy by history:  No  Recommended Plan for Multiple Antipsychotic Therapies: NA     Medication List    STOP taking these medications        butalbital-acetaminophen-caffeine 50-325-40 MG tablet   Commonly known as:  FIORICET     diclofenac sodium 1 % Gel  Commonly known as:  VOLTAREN     hydrOXYzine 25 MG tablet  Commonly known as:  ATARAX/VISTARIL      TAKE these medications      Indication   amLODipine 10 MG tablet  Commonly known as:  NORVASC  Take 1 tablet (10 mg total) by mouth daily.  Notes to Patient:  Hypertension      aspirin 81 MG EC tablet  Take 1 tablet (81 mg total) by mouth daily.  Notes to Patient:  Cardiovascular health      atorvastatin 20 MG tablet  Commonly known as:  LIPITOR  Take 1 tablet (20 mg total) by mouth daily at 6 PM.  Notes to Patient:  Cholesterol      chlorthalidone 25 MG tablet  Commonly known as:  HYGROTON  Take 1 tablet (25 mg total) by mouth daily.  Notes to Patient:  Hypertension      cloNIDine 0.2 MG tablet  Commonly known as:  CATAPRES  Take 1 tablet (0.2 mg total) by mouth 2 (two) times daily.  Notes to Patient:  Hypertension      clopidogrel 75 MG tablet  Commonly known as:  PLAVIX  Take 1 tablet (75 mg total) by mouth daily.  Notes to Patient:  Cardiovascular health      insulin aspart 100 UNIT/ML injection  Commonly known as:  novoLOG  Inject 10 Units into the skin 2 (two) times daily with a meal.  Notes to Patient:  Diabetes      lisinopril 40 MG tablet  Commonly known as:  PRINIVIL,ZESTRIL  Take 1 tablet (40 mg total) by mouth daily.  Notes to Patient:  Hypertension      metFORMIN 500 MG tablet  Commonly known as:  GLUCOPHAGE  Take 1 tablet (500 mg total) by mouth 2 (two) times daily before a meal.  Notes to Patient:  Diabetes      nitroGLYCERIN 0.4 MG SL tablet  Commonly known as:  NITROSTAT  Place 1 tablet (0.4 mg total) under the tongue every 5 (five) minutes as needed for chest pain.  Notes to Patient:  Chest pain      pantoprazole 40 MG tablet  Commonly known as:  PROTONIX  Take 1 tablet (40 mg total) by mouth daily.  Notes to Patient:  GERD      sertraline 100 MG tablet  Commonly known as:   ZOLOFT  Take 0.5 tablets (50 mg total) by mouth daily.  Notes to Patient:  Depression and anxiety      tadalafil 20 MG tablet  Commonly known as:  CIALIS  Take 1 tablet (20 mg total) by mouth daily as needed for erectile dysfunction.  Notes to Patient:  Erectile dysfunction      traZODone 150 MG tablet  Commonly known as:  DESYREL  Take 1 tablet (150 mg total) by mouth at bedtime.  Notes to Patient:  insomnia            Follow-up Information    Follow up with [facility].   Why:  Please arrive to the walk-in clinic between the hours of 8am-2:30pm for an assessment for medication management and therapy.  Arrive as early as possible for prompt service.  Please call Unk Pinto at [phone] for questions and assistance.   Contact information:                  [facility] [address] [place] [address] Ph: [phone] Fax: [phone]      Follow-up recommendations:  Other:  Continue to follow up with [facility] health services where patient receives primary care    Signed: Jimmy Footman, MD 03/10/2015, 4:31 PM

## 2015-04-16 ENCOUNTER — Encounter: Payer: Self-pay | Admitting: Emergency Medicine

## 2015-04-16 ENCOUNTER — Emergency Department
Admission: EM | Admit: 2015-04-16 | Discharge: 2015-04-16 | Disposition: A | Discharge status: Home / Self Care | Payer: Medicaid Other | Attending: Emergency Medicine | Admitting: Emergency Medicine

## 2015-04-16 DIAGNOSIS — E119 Type 2 diabetes mellitus without complications: Secondary | ICD-10-CM | POA: Diagnosis not present

## 2015-04-16 DIAGNOSIS — Z7902 Long term (current) use of antithrombotics/antiplatelets: Secondary | ICD-10-CM | POA: Diagnosis not present

## 2015-04-16 DIAGNOSIS — Z7984 Long term (current) use of oral hypoglycemic drugs: Secondary | ICD-10-CM | POA: Insufficient documentation

## 2015-04-16 DIAGNOSIS — I10 Essential (primary) hypertension: Secondary | ICD-10-CM | POA: Diagnosis not present

## 2015-04-16 DIAGNOSIS — Z7982 Long term (current) use of aspirin: Secondary | ICD-10-CM | POA: Diagnosis not present

## 2015-04-16 DIAGNOSIS — Z791 Long term (current) use of non-steroidal anti-inflammatories (NSAID): Secondary | ICD-10-CM | POA: Insufficient documentation

## 2015-04-16 DIAGNOSIS — F17210 Nicotine dependence, cigarettes, uncomplicated: Secondary | ICD-10-CM | POA: Insufficient documentation

## 2015-04-16 DIAGNOSIS — M5441 Lumbago with sciatica, right side: Secondary | ICD-10-CM | POA: Insufficient documentation

## 2015-04-16 DIAGNOSIS — Z79899 Other long term (current) drug therapy: Secondary | ICD-10-CM | POA: Diagnosis not present

## 2015-04-16 DIAGNOSIS — M545 Low back pain: Secondary | ICD-10-CM | POA: Diagnosis present

## 2015-04-16 DIAGNOSIS — Z794 Long term (current) use of insulin: Secondary | ICD-10-CM | POA: Insufficient documentation

## 2015-04-16 MED ORDER — MELOXICAM 7.5 MG PO TABS
15.0000 mg | ORAL_TABLET | Freq: Once | ORAL | Status: AC
  Administered 2015-04-16: 15 mg via ORAL
  Filled 2015-04-16: qty 2

## 2015-04-16 MED ORDER — MELOXICAM 7.5 MG PO TABS: 7.5000 mg | ORAL_TABLET | Freq: Every day | ORAL | Status: DC

## 2015-04-16 MED ORDER — METHOCARBAMOL 750 MG PO TABS: 1500.0000 mg | ORAL_TABLET | Freq: Four times a day (QID) | ORAL | Status: DC

## 2015-04-16 NOTE — ED Notes (Signed)
Awaiting medication from pharmacy for discharge.

## 2015-04-16 NOTE — ED Notes (Signed)
Patient has had non-traumatic low back pain for 2 days. No alterations in bowel or bladder

## 2015-04-16 NOTE — Discharge Instructions (Signed)
Back Exercises °The following exercises strengthen the muscles that help to support the back. They also help to keep the lower back flexible. Doing these exercises can help to prevent back pain or lessen existing pain. °If you have back pain or discomfort, try doing these exercises 2-3 times each day or as told by your health care provider. When the pain goes away, do them once each day, but increase the number of times that you repeat the steps for each exercise (do more repetitions). If you do not have back pain or discomfort, do these exercises once each day or as told by your health care provider. °EXERCISES °Single Knee to Chest °Repeat these steps 3-5 times for each leg: °· Lie on your back on a firm bed or the floor with your legs extended. °· Bring one knee to your chest. Your other leg should stay extended and in contact with the floor. °· Hold your knee in place by grabbing your knee or thigh. °· Pull on your knee until you feel a gentle stretch in your lower back. °· Hold the stretch for 10-30 seconds. °· Slowly release and straighten your leg. °Pelvic Tilt °Repeat these steps 5-10 times: °· Lie on your back on a firm bed or the floor with your legs extended. °· Bend your knees so they are pointing toward the ceiling and your feet are flat on the floor. °· Tighten your lower abdominal muscles to press your lower back against the floor. This motion will tilt your pelvis so your tailbone points up toward the ceiling instead of pointing to your feet or the floor. °· With gentle tension and even breathing, hold this position for 5-10 seconds. °Cat-Cow °Repeat these steps until your lower back becomes more flexible: °· Get into a hands-and-knees position on a firm surface. Keep your hands under your shoulders, and keep your knees under your hips. You may place padding under your knees for comfort. °· Let your head hang down, and point your tailbone toward the floor so your lower back becomes rounded like the  back of a cat. °· Hold this position for 5 seconds. °· Slowly lift your head and point your tailbone up toward the ceiling so your back forms a sagging arch like the back of a cow. °· Hold this position for 5 seconds. °Press-Ups °Repeat these steps 5-10 times: °· Lie on your abdomen (face-down) on the floor. °· Place your palms near your head, about shoulder-width apart. °· While you keep your back as relaxed as possible and keep your hips on the floor, slowly straighten your arms to raise the top half of your body and lift your shoulders. Do not use your back muscles to raise your upper torso. You may adjust the placement of your hands to make yourself more comfortable. °· Hold this position for 5 seconds while you keep your back relaxed. °· Slowly return to lying flat on the floor. °Bridges °Repeat these steps 10 times: °· Lie on your back on a firm surface. °· Bend your knees so they are pointing toward the ceiling and your feet are flat on the floor. °· Tighten your buttocks muscles and lift your buttocks off of the floor until your waist is at almost the same height as your knees. You should feel the muscles working in your buttocks and the back of your thighs. If you do not feel these muscles, slide your feet 1-2 inches farther away from your buttocks. °· Hold this position for 3-5   seconds. °· Slowly lower your hips to the starting position, and allow your buttocks muscles to relax completely. °If this exercise is too easy, try doing it with your arms crossed over your chest. °Abdominal Crunches °Repeat these steps 5-10 times: °· Lie on your back on a firm bed or the floor with your legs extended. °· Bend your knees so they are pointing toward the ceiling and your feet are flat on the floor. °· Cross your arms over your chest. °· Tip your chin slightly toward your chest without bending your neck. °· Tighten your abdominal muscles and slowly raise your trunk (torso) high enough to lift your shoulder blades a  tiny bit off of the floor. Avoid raising your torso higher than that, because it can put too much stress on your low back and it does not help to strengthen your abdominal muscles. °· Slowly return to your starting position. °Back Lifts °Repeat these steps 5-10 times: °· Lie on your abdomen (face-down) with your arms at your sides, and rest your forehead on the floor. °· Tighten the muscles in your legs and your buttocks. °· Slowly lift your chest off of the floor while you keep your hips pressed to the floor. Keep the back of your head in line with the curve in your back. Your eyes should be looking at the floor. °· Hold this position for 3-5 seconds. °· Slowly return to your starting position. °SEEK MEDICAL CARE IF: °· Your back pain or discomfort gets much worse when you do an exercise. °· Your back pain or discomfort does not lessen within 2 hours after you exercise. °If you have any of these problems, stop doing these exercises right away. Do not do them again unless your health care provider says that you can. °SEEK IMMEDIATE MEDICAL CARE IF: °· You develop sudden, severe back pain. If this happens, stop doing the exercises right away. Do not do them again unless your health care provider says that you can. °  °This information is not intended to replace advice given to you by your health care provider. Make sure you discuss any questions you have with your health care provider. °  °Document Released: 02/29/2004 Document Revised: 10/12/2014 Document Reviewed: 03/17/2014 °Elsevier Interactive Patient Education ©2016 [employer] °Sciatica °Sciatica is pain, weakness, numbness, or tingling along the path of the sciatic nerve. The nerve starts in the lower back and runs down the back of each leg. The nerve controls the muscles in the lower leg and in the back of the knee, while also providing sensation to the back of the thigh, lower leg, and the sole of your foot. Sciatica is a symptom of another medical  condition. For instance, nerve damage or certain conditions, such as a herniated disk or bone spur on the spine, pinch or put pressure on the sciatic nerve. This causes the pain, weakness, or other sensations normally associated with sciatica. Generally, sciatica only affects one side of the body. °CAUSES  °· Herniated or slipped disc. °· Degenerative disk disease. °· A pain disorder involving the narrow muscle in the buttocks (piriformis syndrome). °· Pelvic injury or fracture. °· Pregnancy. °· Tumor (rare). °SYMPTOMS  °Symptoms can vary from mild to very severe. The symptoms usually travel from the low back to the buttocks and down the back of the leg. Symptoms can include: °· Mild tingling or dull aches in the lower back, leg, or hip. °· Numbness in the back of the calf or sole of the   foot. °· Burning sensations in the lower back, leg, or hip. °· Sharp pains in the lower back, leg, or hip. °· Leg weakness. °· Severe back pain inhibiting movement. °These symptoms may get worse with coughing, sneezing, laughing, or prolonged sitting or standing. Also, being overweight may worsen symptoms. °DIAGNOSIS  °Your caregiver will perform a physical exam to look for common symptoms of sciatica. He or she may ask you to do certain movements or activities that would trigger sciatic nerve pain. Other tests may be performed to find the cause of the sciatica. These may include: °· Blood tests. °· X-rays. °· Imaging tests, such as an MRI or CT scan. °TREATMENT  °Treatment is directed at the cause of the sciatic pain. Sometimes, treatment is not necessary and the pain and discomfort goes away on its own. If treatment is needed, your caregiver may suggest: °· Over-the-counter medicines to relieve pain. °· Prescription medicines, such as anti-inflammatory medicine, muscle relaxants, or narcotics. °· Applying heat or ice to the painful area. °· Steroid injections to lessen pain, irritation, and inflammation around the  nerve. °· Reducing activity during periods of pain. °· Exercising and stretching to strengthen your abdomen and improve flexibility of your spine. Your caregiver may suggest losing weight if the extra weight makes the back pain worse. °· Physical therapy. °· Surgery to eliminate what is pressing or pinching the nerve, such as a bone spur or part of a herniated disk. °HOME CARE INSTRUCTIONS  °· Only take over-the-counter or prescription medicines for pain or discomfort as directed by your caregiver. °· Apply ice to the affected area for 20 minutes, 3-4 times a day for the first 48-72 hours. Then try heat in the same way. °· Exercise, stretch, or perform your usual activities if these do not aggravate your pain. °· Attend physical therapy sessions as directed by your caregiver. °· Keep all follow-up appointments as directed by your caregiver. °· Do not wear high heels or shoes that do not provide proper support. °· Check your mattress to see if it is too soft. A firm mattress may lessen your pain and discomfort. °SEEK IMMEDIATE MEDICAL CARE IF:  °· You lose control of your bowel or bladder (incontinence). °· You have increasing weakness in the lower back, pelvis, buttocks, or legs. °· You have redness or swelling of your back. °· You have a burning sensation when you urinate. °· You have pain that gets worse when you lie down or awakens you at night. °· Your pain is worse than you have experienced in the past. °· Your pain is lasting longer than 4 weeks. °· You are suddenly losing weight without reason. °MAKE SURE YOU: °· Understand these instructions. °· Will watch your condition. °· Will get help right away if you are not doing well or get worse. °  °This information is not intended to replace advice given to you by your health care provider. Make sure you discuss any questions you have with your health care provider. °  °Document Released: 01/15/2001 Document Revised: 10/12/2014 Document Reviewed:  06/02/2011 °Elsevier Interactive Patient Education ©2016 [employer] ° °

## 2015-04-16 NOTE — ED Provider Notes (Signed)
[facility] Emergency Department Provider Note  ____________________________________________  Time seen: 83  I have reviewed the triage vital signs and the nursing notes.   HISTORY  Chief Complaint Back Pain    HPI [name] Kasen Adduci. is a 55 y.o. male who complains of low back pain on the right side for the past 2 days. It radiates down into the right posterior thigh. No bowel or bladder retention or incontinence. Pain with walking but no weakness in the lower extremities. No recent trauma or falls. No heavy lifting. No sudden onset. Just very gradual onset and worsening. No other complaints     Past Medical History  Diagnosis Date  . Hypertension   . Diabetes mellitus   . GERD (gastroesophageal reflux disease)   . Pericarditis   . Polysubstance abuse     cocaine, marijuana  . Auditory hallucinations   . Depression   . Hx of medication noncompliance   . History of suicide attempt   . Stroke [facility]) February 2015    Right MCA aneurysm; head MRI - acute infarct of the right frontal lobe. Smaller acute infarct of the left frontal lobe. Thought to be due to cerebral emboli. Left PICA infarct. Small chronic lacunar infarct right internal capsule. Chronic microvascular ischemic change with white matter change.  . Coronary artery disease     No reported catheterization. Negative Myoview stress test and 2013.  Marland Kitchen Syncope and collapse   . Bipolar disorder (HCC)   . Obesity, Class II, BMI 35-39.9, with comorbidity (HCC)   . Hypertensive hypertrophic cardiomyopathy [facility]) February 2015    Severe concentric LVH on echocardiogram.  . Aortic stenosis, mild February 2015     Patient Active Problem List   Diagnosis Date Noted  . 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 Major depressive disorder, recurrent severe without psychotic features (HCC) 11/07/2014  . Tobacco use disorder 11/07/2014  . Stimulant use disorder  (HCC) crack cocaine 11/07/2014  . Alcohol use disorder, moderate, dependence (HCC) 11/07/2014  . CAD (coronary artery disease) 11/05/2014  . CVA (cerebral infarction) 10/19/2014  . LVH (left ventricular hypertrophy) 05/20/2013  . Obesity (BMI 30-39.9) 04/10/2013  . Aortic stenosis, mild 03/07/2013  . DM (diabetes mellitus), type 2, uncontrolled (HCC) 01/05/2013  . Essential hypertension, malignant 01/25/2009     Past Surgical History  Procedure Laterality Date  . Pericardial fluid drainage    . Transthoracic echocardiogram  03/23/2013    EF 5560%. Elevated left atrial pressure. Severe concentric LVH. Mild aortic stenosis. -- Appearance of hypertensive hypertrophic heart disease.  No clear cardioembolic source  . Nm myoview ltd  July 2013    No ischemia or infarct. There is inferior wall defec suggestive of diaphragmatic attenuation. Mild LV dilation at to be due to hypertensive heart disease. EF 55%. Poor exercise tolerance.  . Carotid dopplers:  03/23/2013    Interval thickening of bilateral common carotid no significant plaque formation. Tortuosity but no significant stenosis. Patent bilateral vertebral  arteries     Current Outpatient Rx  Name  Route  Sig  Dispense  Refill  . amLODipine (NORVASC) 10 MG tablet   Oral   Take 1 tablet (10 mg total) by mouth daily.   30 tablet   6   . aspirin EC 81 MG EC tablet   Oral   Take 1 tablet (81 mg total) by mouth daily.   30 tablet   0   . atorvastatin (LIPITOR) 20 MG tablet  Oral   Take 1 tablet (20 mg total) by mouth daily at 6 PM.   30 tablet   6   . chlorthalidone (HYGROTON) 25 MG tablet   Oral   Take 1 tablet (25 mg total) by mouth daily.   30 tablet   0   . cloNIDine (CATAPRES) 0.2 MG tablet   Oral   Take 1 tablet (0.2 mg total) by mouth 2 (two) times daily.   60 tablet   6   . clopidogrel (PLAVIX) 75 MG tablet   Oral   Take 1 tablet (75 mg total) by mouth daily.   30 tablet   6   . insulin aspart  (NOVOLOG) 100 UNIT/ML injection   Subcutaneous   Inject 10 Units into the skin 2 (two) times daily with a meal.   30 mL   0   . lisinopril (PRINIVIL,ZESTRIL) 40 MG tablet   Oral   Take 1 tablet (40 mg total) by mouth daily.   30 tablet   6   . meloxicam (MOBIC) 7.5 MG tablet   Oral   Take 1 tablet (7.5 mg total) by mouth daily.   7 tablet   0   . metFORMIN (GLUCOPHAGE) 500 MG tablet   Oral   Take 1 tablet (500 mg total) by mouth 2 (two) times daily before a meal.   60 tablet   0   . methocarbamol (ROBAXIN-750) 750 MG tablet   Oral   Take 2 tablets (1,500 mg total) by mouth 4 (four) times daily.   24 tablet   0   . nitroGLYCERIN (NITROSTAT) 0.4 MG SL tablet   Sublingual   Place 1 tablet (0.4 mg total) under the tongue every 5 (five) minutes as needed for chest pain.   30 tablet   12   . pantoprazole (PROTONIX) 40 MG tablet   Oral   Take 1 tablet (40 mg total) by mouth daily.   30 tablet   0   . sertraline (ZOLOFT) 100 MG tablet   Oral   Take 0.5 tablets (50 mg total) by mouth daily.   30 tablet   0   . tadalafil (CIALIS) 20 MG tablet   Oral   Take 1 tablet (20 mg total) by mouth daily as needed for erectile dysfunction.   3 tablet   5   . traZODone (DESYREL) 150 MG tablet   Oral   Take 1 tablet (150 mg total) by mouth at bedtime.   30 tablet   0      Allergies Review of patient's allergies indicates no known allergies.   Family History  Problem Relation Age of Onset  . Stroke Mother   . Diabetes type II Mother   . Hypertension Mother   . Hypertension Father   . Arthritis Father   . Arthritis Sister   . Diabetes type II Sister   . Hypertension Sister     Social History Social History  Substance Use Topics  . Smoking status: Current Every Day Smoker -- 0.50 packs/day for 40 years    Types: Cigars  . Smokeless tobacco: Never Used  . Alcohol Use: 25.8 oz/week    3 Glasses of wine, 40 Cans of beer per week     Comment: occasionally;  reports 1 pint of vodka per month at paydaysocially    Review of Systems  Constitutional:   No fever or chills. No weight changes Eyes:   No blurry vision or double vision.  ENT:  No sore throat.  Cardiovascular:   No chest pain. Respiratory:   No dyspnea or cough. Gastrointestinal:   Negative for abdominal pain, vomiting and diarrhea.  No BRBPR or melena. Genitourinary:   Negative for dysuria or difficulty urinating. Musculoskeletal:   Positive right lower back pain. No joint swelling or pain.. Skin:   Negative for rash. Neurological:   Negative for headaches, focal weakness or numbness. Psychiatric:  No anxiety or depression.   Endocrine:  No changes in energy or sleep difficulty.  10-point ROS otherwise negative.  ____________________________________________   PHYSICAL EXAM:  VITAL SIGNS: ED Triage Vitals  Enc Vitals Group     BP 04/16/15 0925 160/88 mmHg     Pulse Rate 04/16/15 0925 83     Resp 04/16/15 0925 18     Temp 04/16/15 0925 97.9 F (36.6 C)     Temp Source 04/16/15 0925 Oral     SpO2 04/16/15 0925 97 %     Weight 04/16/15 0923 230 lb (104.327 kg)     Height 04/16/15 0923  (1.803 m)     Head Cir --      Peak Flow --      Pain Score 04/16/15 0924 10     Pain Loc --      Pain Edu? --      Excl. in GC? --     Vital signs reviewed, nursing assessments reviewed.   Constitutional:   Alert and oriented. Well appearing and in no distress. Eyes: No scleral icterus Cardiovascular: Strong symmetric peripheral pulses in bilateral lower extremities and dorsalis pedis. Gastrointestinal:   Soft and nontender. Non distended. There is no CVA tenderness.  No rebound, rigidity, or guarding. Genitourinary:   deferred Musculoskeletal:   Tenderness in the right lower back soft tissues. No midline spinal tenderness. Straight leg raise positive at 30 bilaterally. Neurologic:   Normal speech and language.  CN 2-10 normal. Motor grossly intact. Normal  well-balanced symmetric gait although walking is limited by back pain. No gross focal neurologic deficits are appreciated.  Skin:    Skin is warm, dry and intact. No rash noted.  No petechiae, purpura, or bullae. Psychiatric:   Mood and affect are normal. ____________________________________________    LABS (pertinent positives/negatives) (all labs ordered are listed, but only abnormal results are displayed) Labs Reviewed - No data to display ____________________________________________   EKG    ____________________________________________    RADIOLOGY    ____________________________________________   PROCEDURES   ____________________________________________   INITIAL IMPRESSION / ASSESSMENT AND PLAN / ED COURSE  Pertinent labs & imaging results that were available during my care of the patient were reviewed by me and considered in my medical decision making (see chart for details).  Patient presents with lower back pain that is nontraumatic, symptoms and exam are consistent with a herniated disc with some right-sided sciatica, likely related to his obesity.. Low suspicion for cauda equina epidural abscess or hematoma. No evidence of any fracture or traumatic injury. With the patient's multiple medical problems and history of polysubstance abuse, we'll treat him with a short course of meloxicam and methocarbamol and have him follow up closely with primary care for further symptom management and consideration of physical therapy referral.     ____________________________________________   FINAL CLINICAL IMPRESSION(S) / ED DIAGNOSES  Final diagnoses:  Right-sided low back pain with right-sided sciatica      Sharman Cheek, MD 04/16/15 1001

## 2015-04-16 NOTE — ED Notes (Signed)
Pt alert and oriented X4, active, cooperative, pt in NAD. RR even and unlabored, color WNL.  Pt informed to return if any life threatening symptoms occur.   

## 2015-04-17 ENCOUNTER — Encounter: Payer: Self-pay | Admitting: Medical Oncology

## 2015-04-17 ENCOUNTER — Emergency Department
Admission: EM | Admit: 2015-04-17 | Discharge: 2015-04-17 | Disposition: A | Discharge status: Home / Self Care | Payer: Medicaid Other | Attending: Emergency Medicine | Admitting: Emergency Medicine

## 2015-04-17 DIAGNOSIS — I10 Essential (primary) hypertension: Secondary | ICD-10-CM | POA: Diagnosis not present

## 2015-04-17 DIAGNOSIS — M5441 Lumbago with sciatica, right side: Secondary | ICD-10-CM | POA: Insufficient documentation

## 2015-04-17 DIAGNOSIS — Z79899 Other long term (current) drug therapy: Secondary | ICD-10-CM | POA: Insufficient documentation

## 2015-04-17 DIAGNOSIS — Z7902 Long term (current) use of antithrombotics/antiplatelets: Secondary | ICD-10-CM | POA: Diagnosis not present

## 2015-04-17 DIAGNOSIS — Z7982 Long term (current) use of aspirin: Secondary | ICD-10-CM | POA: Diagnosis not present

## 2015-04-17 DIAGNOSIS — F17210 Nicotine dependence, cigarettes, uncomplicated: Secondary | ICD-10-CM | POA: Diagnosis not present

## 2015-04-17 DIAGNOSIS — Z791 Long term (current) use of non-steroidal anti-inflammatories (NSAID): Secondary | ICD-10-CM | POA: Insufficient documentation

## 2015-04-17 DIAGNOSIS — Z794 Long term (current) use of insulin: Secondary | ICD-10-CM | POA: Insufficient documentation

## 2015-04-17 DIAGNOSIS — Z7984 Long term (current) use of oral hypoglycemic drugs: Secondary | ICD-10-CM | POA: Insufficient documentation

## 2015-04-17 DIAGNOSIS — M545 Low back pain: Secondary | ICD-10-CM | POA: Diagnosis present

## 2015-04-17 DIAGNOSIS — E119 Type 2 diabetes mellitus without complications: Secondary | ICD-10-CM | POA: Diagnosis not present

## 2015-04-17 MED ORDER — KETOROLAC TROMETHAMINE 30 MG/ML IJ SOLN
30.0000 mg | Freq: Once | INTRAMUSCULAR | Status: AC
  Administered 2015-04-17: 30 mg via INTRAMUSCULAR
  Filled 2015-04-17: qty 1

## 2015-04-17 MED ORDER — NAPROXEN 500 MG PO TABS: 500.0000 mg | ORAL_TABLET | Freq: Two times a day (BID) | ORAL | Status: DC

## 2015-04-17 NOTE — ED Notes (Signed)
Pt from home via ems with reports of R lower back pain x 3 days. Pt was seen here yesterday but couldn't get RX filled.

## 2015-04-17 NOTE — ED Notes (Signed)
Pt informed to return if any life threatening symptoms occur.  

## 2015-04-17 NOTE — Care Management Note (Signed)
Case Management Note  Patient Details  Name: [name] LimLinnies Uliano Jr. MRN: [id number] Date of Birth: [birth year]  Subjective/Objective:    Patient given bus pass after exhausting other options for transport home.            Action/Plan:   Expected Discharge Date:                  Expected Discharge Plan:     In-House Referral:     Discharge planning Services     Post Acute Care Choice:    Choice offered to:     DME Arranged:    DME Agency:     HH Arranged:    HH Agency:     Status of Service:     Medicare Important Message Given:    Date Medicare IM Given:    Medicare IM give by:    Date Additional Medicare IM Given:    Additional Medicare Important Message give by:     If discussed at Long Length of Stay Meetings, dates discussed:    Additional Comments:  Berna BueCheryl Allisha Harter, RN 04/17/2015, 11:16 AM

## 2015-04-17 NOTE — ED Provider Notes (Signed)
[facility] Emergency Department Provider Note  ____________________________________________  Time seen: On arrival  I have reviewed the triage vital signs and the nursing notes.   HISTORY  Chief Complaint Back Pain    HPI [name] Michaell Cowingride Jr. is a 55 y.o. male who presents with complaints of back pain. Patient reports right lower back pain, paraspinal that radiates into his right leg for approximately 3 days. He was seen yesterday but was unable to get his medications because of cost and complained that he continues to have pain. He denies fevers or chills. He denies drug abuse to me. He denies abdominal pain. No focal weakness or groin numbness or tingling. No incontinence    Past Medical History  Diagnosis Date  . Hypertension   . Diabetes mellitus   . GERD (gastroesophageal reflux disease)   . Pericarditis   . Polysubstance abuse     cocaine, marijuana  . Auditory hallucinations   . Depression   . Hx of medication noncompliance   . History of suicide attempt   . Stroke [facility](HCC) February 2015    Right MCA aneurysm; head MRI - acute infarct of the right frontal lobe. Smaller acute infarct of the left frontal lobe. Thought to be due to cerebral emboli. Left PICA infarct. Small chronic lacunar infarct right internal capsule. Chronic microvascular ischemic change with white matter change.  . Coronary artery disease     No reported catheterization. Negative Myoview stress test and 2013.  Marland Kitchen. Syncope and collapse   . Bipolar disorder (HCC)   . Obesity, Class II, BMI 35-39.9, with comorbidity (HCC)   . Hypertensive hypertrophic cardiomyopathy [facility](HCC) February 2015    Severe concentric LVH on echocardiogram.  . Aortic stenosis, mild February 2015    Patient Active Problem List   Diagnosis Date Noted  . 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 Major depressive disorder, recurrent severe without psychotic features  (HCC) 11/07/2014  . Tobacco use disorder 11/07/2014  . Stimulant use disorder (HCC) crack cocaine 11/07/2014  . Alcohol use disorder, moderate, dependence (HCC) 11/07/2014  . CAD (coronary artery disease) 11/05/2014  . CVA (cerebral infarction) 10/19/2014  . LVH (left ventricular hypertrophy) 05/20/2013  . Obesity (BMI 30-39.9) 04/10/2013  . Aortic stenosis, mild 03/07/2013  . DM (diabetes mellitus), type 2, uncontrolled (HCC) 01/05/2013  . Essential hypertension, malignant 01/25/2009    Past Surgical History  Procedure Laterality Date  . Pericardial fluid drainage    . Transthoracic echocardiogram  03/23/2013    EF 5560%. Elevated left atrial pressure. Severe concentric LVH. Mild aortic stenosis. -- Appearance of hypertensive hypertrophic heart disease.  No clear cardioembolic source  . Nm myoview ltd  July 2013    No ischemia or infarct. There is inferior wall defec suggestive of diaphragmatic attenuation. Mild LV dilation at to be due to hypertensive heart disease. EF 55%. Poor exercise tolerance.  . Carotid dopplers:  03/23/2013    Interval thickening of bilateral common carotid no significant plaque formation. Tortuosity but no significant stenosis. Patent bilateral vertebral  arteries    Current Outpatient Rx  Name  Route  Sig  Dispense  Refill  . amLODipine (NORVASC) 10 MG tablet   Oral   Take 1 tablet (10 mg total) by mouth daily.   30 tablet   6   . aspirin EC 81 MG EC tablet   Oral   Take 1 tablet (81 mg total) by mouth daily.   30 tablet   0   .  atorvastatin (LIPITOR) 20 MG tablet   Oral   Take 1 tablet (20 mg total) by mouth daily at 6 PM.   30 tablet   6   . chlorthalidone (HYGROTON) 25 MG tablet   Oral   Take 1 tablet (25 mg total) by mouth daily.   30 tablet   0   . cloNIDine (CATAPRES) 0.2 MG tablet   Oral   Take 1 tablet (0.2 mg total) by mouth 2 (two) times daily.   60 tablet   6   . clopidogrel (PLAVIX) 75 MG tablet   Oral   Take 1 tablet  (75 mg total) by mouth daily.   30 tablet   6   . insulin aspart (NOVOLOG) 100 UNIT/ML injection   Subcutaneous   Inject 10 Units into the skin 2 (two) times daily with a meal.   30 mL   0   . lisinopril (PRINIVIL,ZESTRIL) 40 MG tablet   Oral   Take 1 tablet (40 mg total) by mouth daily.   30 tablet   6   . meloxicam (MOBIC) 7.5 MG tablet   Oral   Take 1 tablet (7.5 mg total) by mouth daily.   7 tablet   0   . metFORMIN (GLUCOPHAGE) 500 MG tablet   Oral   Take 1 tablet (500 mg total) by mouth 2 (two) times daily before a meal.   60 tablet   0   . methocarbamol (ROBAXIN-750) 750 MG tablet   Oral   Take 2 tablets (1,500 mg total) by mouth 4 (four) times daily.   24 tablet   0   . naproxen (NAPROSYN) 500 MG tablet   Oral   Take 1 tablet (500 mg total) by mouth 2 (two) times daily with a meal.   20 tablet   2   . nitroGLYCERIN (NITROSTAT) 0.4 MG SL tablet   Sublingual   Place 1 tablet (0.4 mg total) under the tongue every 5 (five) minutes as needed for chest pain.   30 tablet   12   . pantoprazole (PROTONIX) 40 MG tablet   Oral   Take 1 tablet (40 mg total) by mouth daily.   30 tablet   0   . sertraline (ZOLOFT) 100 MG tablet   Oral   Take 0.5 tablets (50 mg total) by mouth daily.   30 tablet   0   . tadalafil (CIALIS) 20 MG tablet   Oral   Take 1 tablet (20 mg total) by mouth daily as needed for erectile dysfunction.   3 tablet   5   . traZODone (DESYREL) 150 MG tablet   Oral   Take 1 tablet (150 mg total) by mouth at bedtime.   30 tablet   0     Allergies Review of patient's allergies indicates no known allergies.  Family History  Problem Relation Age of Onset  . Stroke Mother   . Diabetes type II Mother   . Hypertension Mother   . Hypertension Father   . Arthritis Father   . Arthritis Sister   . Diabetes type II Sister   . Hypertension Sister     Social History Social History  Substance Use Topics  . Smoking status: Current  Every Day Smoker -- 0.50 packs/day for 40 years    Types: Cigars  . Smokeless tobacco: Never Used  . Alcohol Use: 25.8 oz/week    3 Glasses of wine, 40 Cans of beer per week  Comment: occasionally; reports 1 pint of vodka per month at paydaysocially    Review of Systems  Constitutional: Negative for fever. Eyes: Negative for visual changes. ENT: Negative for sore throat   Genitourinary: Negative for dysuria. Musculoskeletal: Negative for back pain. Skin: Negative for rash. Neurological: Negative for headaches or focal weakness   ____________________________________________   PHYSICAL EXAM:  VITAL SIGNS: ED Triage Vitals  Enc Vitals Group     BP 04/17/15 1023 190/124 mmHg     Pulse Rate 04/17/15 1023 76     Resp 04/17/15 1023 20     Temp 04/17/15 1023 97.6 F (36.4 C)     Temp Source 04/17/15 1023 Oral     SpO2 04/17/15 1023 97 %     Weight 04/17/15 1023 230 lb (104.327 kg)     Height 04/17/15 1023  (1.803 m)     Head Cir --      Peak Flow --      Pain Score 04/17/15 1024 10     Pain Loc --      Pain Edu? --      Excl. in GC? --      Constitutional: Alert and oriented. Well appearing and in no distress. Eyes: Conjunctivae are normal.  ENT   Head: Normocephalic and atraumatic.   Mouth/Throat: Mucous membranes are moist. Cardiovascular: Normal rate, regular rhythm.  Respiratory: Normal respiratory effort without tachypnea nor retractions.  Gastrointestinal: Soft and non-tender in all quadrants. No distention. There is no CVA tenderness. Musculoskeletal: Nontender with normal range of motion in all extremities. Mild lumbar paraspinal tenderness to palpation on the right. Normal strength in the lower extremities Neurologic:  Normal speech and language. No gross focal neurologic deficits are appreciated. She grossly intact Skin:  Skin is warm, dry and intact. No rash noted. Psychiatric: Mood and affect are normal. Patient exhibits appropriate insight  and judgment.  ____________________________________________    LABS (pertinent positives/negatives)  Labs Reviewed - No data to display  ____________________________________________     ____________________________________________    RADIOLOGY I have personally reviewed any xrays that were ordered on this patient: None  ____________________________________________   PROCEDURES  Procedure(s) performed: none   ____________________________________________   INITIAL IMPRESSION / ASSESSMENT AND PLAN / ED COURSE  Pertinent labs & imaging results that were available during my care of the patient were reviewed by me and considered in my medical decision making (see chart for details).  Patient well-appearing and in no distress. Toradol 30 mg IM given. No concerning symptoms. We will discharge with Naprosyn which is more affordable.  ____________________________________________   FINAL CLINICAL IMPRESSION(S) / ED DIAGNOSES  Final diagnoses:  Right-sided low back pain with right-sided sciatica     Jene Every, MD 04/17/15 1520

## 2015-04-17 NOTE — Discharge Instructions (Signed)

## 2015-06-09 ENCOUNTER — Emergency Department: Payer: Medicaid Other

## 2015-06-09 ENCOUNTER — Encounter: Payer: Self-pay | Admitting: *Deleted

## 2015-06-09 ENCOUNTER — Emergency Department
Admission: EM | Admit: 2015-06-09 | Discharge: 2015-06-09 | Disposition: A | Discharge status: Home / Self Care | Payer: Medicaid Other | Attending: Emergency Medicine | Admitting: Emergency Medicine

## 2015-06-09 DIAGNOSIS — Z7984 Long term (current) use of oral hypoglycemic drugs: Secondary | ICD-10-CM | POA: Diagnosis not present

## 2015-06-09 DIAGNOSIS — Z79899 Other long term (current) drug therapy: Secondary | ICD-10-CM | POA: Insufficient documentation

## 2015-06-09 DIAGNOSIS — Z794 Long term (current) use of insulin: Secondary | ICD-10-CM | POA: Diagnosis not present

## 2015-06-09 DIAGNOSIS — Z7982 Long term (current) use of aspirin: Secondary | ICD-10-CM | POA: Diagnosis not present

## 2015-06-09 DIAGNOSIS — E119 Type 2 diabetes mellitus without complications: Secondary | ICD-10-CM | POA: Diagnosis not present

## 2015-06-09 DIAGNOSIS — E669 Obesity, unspecified: Secondary | ICD-10-CM | POA: Insufficient documentation

## 2015-06-09 DIAGNOSIS — I10 Essential (primary) hypertension: Secondary | ICD-10-CM | POA: Diagnosis not present

## 2015-06-09 DIAGNOSIS — F319 Bipolar disorder, unspecified: Secondary | ICD-10-CM | POA: Insufficient documentation

## 2015-06-09 DIAGNOSIS — R072 Precordial pain: Secondary | ICD-10-CM | POA: Diagnosis not present

## 2015-06-09 DIAGNOSIS — I2510 Atherosclerotic heart disease of native coronary artery without angina pectoris: Secondary | ICD-10-CM | POA: Insufficient documentation

## 2015-06-09 DIAGNOSIS — F17290 Nicotine dependence, other tobacco product, uncomplicated: Secondary | ICD-10-CM | POA: Diagnosis not present

## 2015-06-09 DIAGNOSIS — Z8673 Personal history of transient ischemic attack (TIA), and cerebral infarction without residual deficits: Secondary | ICD-10-CM | POA: Insufficient documentation

## 2015-06-09 DIAGNOSIS — R079 Chest pain, unspecified: Secondary | ICD-10-CM

## 2015-06-09 LAB — COMPREHENSIVE METABOLIC PANEL
ALBUMIN: 3.9 g/dL (ref 3.5–5.0)
ALT: 13 U/L — ABNORMAL LOW (ref 17–63)
ANION GAP: 10 (ref 5–15)
AST: 17 U/L (ref 15–41)
Alkaline Phosphatase: 58 U/L (ref 38–126)
BUN: 17 mg/dL (ref 6–20)
CHLORIDE: 100 mmol/L — AB (ref 101–111)
CO2: 26 mmol/L (ref 22–32)
Calcium: 9.4 mg/dL (ref 8.9–10.3)
Creatinine, Ser: 1.04 mg/dL (ref 0.61–1.24)
GFR calc Af Amer: >60 mL/min (ref >60)
GFR calc non Af Amer: >60 mL/min (ref >60)
GLUCOSE: 235 mg/dL — AB (ref 65–99)
POTASSIUM: 3.6 mmol/L (ref 3.5–5.1)
SODIUM: 136 mmol/L (ref 135–145)
Total Bilirubin: 0.5 mg/dL (ref 0.3–1.2)
Total Protein: 7 g/dL (ref 6.5–8.1)

## 2015-06-09 LAB — TROPONIN I
Troponin I: 0.05 ng/mL — ABNORMAL HIGH (ref <0.031)
Troponin I: 0.05 ng/mL — ABNORMAL HIGH (ref <0.031)

## 2015-06-09 LAB — CBC
HCT: 42.3 % (ref 40.0–52.0)
HEMOGLOBIN: 14.7 g/dL (ref 13.0–18.0)
MCH: 31.1 pg (ref 26.0–34.0)
MCHC: 34.7 g/dL (ref 32.0–36.0)
MCV: 89.7 fL (ref 80.0–100.0)
Platelets: 213 10*3/uL (ref 150–440)
RBC: 4.72 MIL/uL (ref 4.40–5.90)
RDW: 12.7 % (ref 11.5–14.5)
WBC: 11.2 10*3/uL — ABNORMAL HIGH (ref 3.8–10.6)

## 2015-06-09 MED ORDER — GI COCKTAIL ~~LOC~~
30.0000 mL | Freq: Once | ORAL | Status: AC
  Administered 2015-06-09: 30 mL via ORAL
  Filled 2015-06-09: qty 30

## 2015-06-09 MED ORDER — LABETALOL HCL 5 MG/ML IV SOLN
20.0000 mg | Freq: Once | INTRAVENOUS | Status: AC
  Administered 2015-06-09: 20 mg via INTRAVENOUS

## 2015-06-09 MED ORDER — LABETALOL HCL 5 MG/ML IV SOLN
20.0000 mg | Freq: Once | INTRAVENOUS | Status: AC
  Administered 2015-06-09: 20 mg via INTRAVENOUS
  Filled 2015-06-09: qty 4

## 2015-06-09 MED ORDER — LABETALOL HCL 5 MG/ML IV SOLN
INTRAVENOUS | Status: AC
  Filled 2015-06-09: qty 4

## 2015-06-09 MED ORDER — PROCHLORPERAZINE EDISYLATE 5 MG/ML IJ SOLN
10.0000 mg | Freq: Once | INTRAMUSCULAR | Status: AC
  Administered 2015-06-09: 10 mg via INTRAVENOUS
  Filled 2015-06-09: qty 2

## 2015-06-09 NOTE — ED Provider Notes (Signed)
[facility] Emergency Department Provider Note  ____________________________________________    I have reviewed the triage vital signs and the nursing notes.   HISTORY  Chief Complaint Chest Pain    HPI [name] [name]. is a 55 y.o. male who presents with 2 days of hiccups and today he felt a burning in his chest so he came to the emergency department. Currently he has no chest pain. He denies shortness of breath. He has a history of chronic poorly controlled high blood pressure. He is not entirely compliant with his medications.He has not taken anything for his hiccups.    Past Medical History  Diagnosis Date  . Hypertension   . Diabetes mellitus   . GERD (gastroesophageal reflux disease)   . Pericarditis   . Polysubstance abuse     cocaine, marijuana  . Auditory hallucinations   . Depression   . Hx of medication noncompliance   . History of suicide attempt   . Stroke [facility]) February 2015    Right MCA aneurysm; head MRI - acute infarct of the right frontal lobe. Smaller acute infarct of the left frontal lobe. Thought to be due to cerebral emboli. Left PICA infarct. Small chronic lacunar infarct right internal capsule. Chronic microvascular ischemic change with white matter change.  . Coronary artery disease     No reported catheterization. Negative Myoview stress test and 2013.  Marland Kitchen Syncope and collapse   . Bipolar disorder (HCC)   . Obesity, Class II, BMI 35-39.9, with comorbidity (HCC)   . Hypertensive hypertrophic cardiomyopathy [facility]) February 2015    Severe concentric LVH on echocardiogram.  . Aortic stenosis, mild February 2015    Patient Active Problem List   Diagnosis Date Noted  . 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 Major depressive disorder, recurrent severe without psychotic features (HCC) 11/07/2014  . Tobacco use disorder 11/07/2014  . Stimulant use disorder (HCC) crack cocaine  11/07/2014  . Alcohol use disorder, moderate, dependence (HCC) 11/07/2014  . CAD (coronary artery disease) 11/05/2014  . CVA (cerebral infarction) 10/19/2014  . LVH (left ventricular hypertrophy) 05/20/2013  . Obesity (BMI 30-39.9) 04/10/2013  . Aortic stenosis, mild 03/07/2013  . DM (diabetes mellitus), type 2, uncontrolled (HCC) 01/05/2013  . Essential hypertension, malignant 01/25/2009    Past Surgical History  Procedure Laterality Date  . Pericardial fluid drainage    . Transthoracic echocardiogram  03/23/2013    EF 5560%. Elevated left atrial pressure. Severe concentric LVH. Mild aortic stenosis. -- Appearance of hypertensive hypertrophic heart disease.  No clear cardioembolic source  . Nm myoview ltd  July 2013    No ischemia or infarct. There is inferior wall defec suggestive of diaphragmatic attenuation. Mild LV dilation at to be due to hypertensive heart disease. EF 55%. Poor exercise tolerance.  . Carotid dopplers:  03/23/2013    Interval thickening of bilateral common carotid no significant plaque formation. Tortuosity but no significant stenosis. Patent bilateral vertebral  arteries    Current Outpatient Rx  Name  Route  Sig  Dispense  Refill  . amLODipine (NORVASC) 10 MG tablet   Oral   Take 1 tablet (10 mg total) by mouth daily.   30 tablet   6   . aspirin EC 81 MG EC tablet   Oral   Take 1 tablet (81 mg total) by mouth daily.   30 tablet   0   . atorvastatin (LIPITOR) 20 MG tablet   Oral   Take  1 tablet (20 mg total) by mouth daily at 6 PM.   30 tablet   6   . chlorthalidone (HYGROTON) 25 MG tablet   Oral   Take 1 tablet (25 mg total) by mouth daily.   30 tablet   0   . cloNIDine (CATAPRES) 0.2 MG tablet   Oral   Take 1 tablet (0.2 mg total) by mouth 2 (two) times daily.   60 tablet   6   . clopidogrel (PLAVIX) 75 MG tablet   Oral   Take 1 tablet (75 mg total) by mouth daily.   30 tablet   6   . insulin aspart (NOVOLOG) 100 UNIT/ML  injection   Subcutaneous   Inject 10 Units into the skin 2 (two) times daily with a meal.   30 mL   0   . lisinopril (PRINIVIL,ZESTRIL) 40 MG tablet   Oral   Take 1 tablet (40 mg total) by mouth daily.   30 tablet   6   . meloxicam (MOBIC) 7.5 MG tablet   Oral   Take 1 tablet (7.5 mg total) by mouth daily.   7 tablet   0   . metFORMIN (GLUCOPHAGE) 500 MG tablet   Oral   Take 1 tablet (500 mg total) by mouth 2 (two) times daily before a meal.   60 tablet   0   . methocarbamol (ROBAXIN-750) 750 MG tablet   Oral   Take 2 tablets (1,500 mg total) by mouth 4 (four) times daily.   24 tablet   0   . naproxen (NAPROSYN) 500 MG tablet   Oral   Take 1 tablet (500 mg total) by mouth 2 (two) times daily with a meal.   20 tablet   2   . nitroGLYCERIN (NITROSTAT) 0.4 MG SL tablet   Sublingual   Place 1 tablet (0.4 mg total) under the tongue every 5 (five) minutes as needed for chest pain.   30 tablet   12   . pantoprazole (PROTONIX) 40 MG tablet   Oral   Take 1 tablet (40 mg total) by mouth daily.   30 tablet   0   . sertraline (ZOLOFT) 100 MG tablet   Oral   Take 0.5 tablets (50 mg total) by mouth daily.   30 tablet   0   . tadalafil (CIALIS) 20 MG tablet   Oral   Take 1 tablet (20 mg total) by mouth daily as needed for erectile dysfunction.   3 tablet   5   . traZODone (DESYREL) 150 MG tablet   Oral   Take 1 tablet (150 mg total) by mouth at bedtime.   30 tablet   0     Allergies Review of patient's allergies indicates no known allergies.  Family History  Problem Relation Age of Onset  . Stroke Mother   . Diabetes type II Mother   . Hypertension Mother   . Hypertension Father   . Arthritis Father   . Arthritis Sister   . Diabetes type II Sister   . Hypertension Sister     Social History Social History  Substance Use Topics  . Smoking status: Current Every Day Smoker -- 0.50 packs/day for 40 years    Types: Cigars  . Smokeless tobacco: Never  Used  . Alcohol Use: No     Comment: occasionally; reports 1 pint of vodka per month at paydaysocially    Review of Systems  Constitutional: Negative for fever. Eyes: Negative  for redness ENT: Negative for sore throat Cardiovascular: Negative for chest pain, Episode of burning prior to arrival Respiratory: Negative for shortness of breath. Gastrointestinal: Negative for abdominal pain, No nausea or vomiting Genitourinary: Negative for dysuria. Musculoskeletal: Negative for back pain. Skin: Negative for rash. Neurological: Negative for focal weakness Psychiatric: Mild anxiety    ____________________________________________   PHYSICAL EXAM:  VITAL SIGNS: ED Triage Vitals  Enc Vitals Group     BP 06/09/15 1356 186/122 mmHg     Pulse Rate 06/09/15 1356 73     Resp 06/09/15 1356 22     Temp 06/09/15 1356 98.6 F (37 C)     Temp Source 06/09/15 1356 Oral     SpO2 06/09/15 1356 98 %     Weight 06/09/15 1356 248 lb 10.9 oz (112.8 kg)     Height 06/09/15 1356 5\' 11"  (1.803 m)     Head Cir --      Peak Flow --      Pain Score 06/09/15 1358 8     Pain Loc --      Pain Edu? --      Excl. in GC? --      Constitutional: Alert and oriented. Well appearing and in no distress.  Eyes: Conjunctivae are normal. No erythema or injection ENT   Head: Normocephalic and atraumatic.   Mouth/Throat: Mucous membranes are moist. Cardiovascular: Normal rate, regular rhythm. Normal and symmetric distal pulses are present in the upper extremities.  Respiratory: Normal respiratory effort without tachypnea nor retractions. Breath sounds are clear and equal bilaterally.  Gastrointestinal: Soft and non-tender in all quadrants. No distention. There is no CVA tenderness. Genitourinary: deferred Musculoskeletal: Nontender with normal range of motion in all extremities. No lower extremity tenderness nor edema. Neurologic:  Normal speech and language. No gross focal neurologic deficits are  appreciated. Skin:  Skin is warm, dry and intact. No rash noted. Psychiatric: Mood and affect are normal. Patient exhibits appropriate insight and judgment.  ____________________________________________    LABS (pertinent positives/negatives)  Labs Reviewed  CBC - Abnormal; Notable for the following:    WBC 11.2 (*)    All other components within normal limits  COMPREHENSIVE METABOLIC PANEL - Abnormal; Notable for the following:    Chloride 100 (*)    Glucose, Bld 235 (*)    ALT 13 (*)    All other components within normal limits  TROPONIN I - Abnormal; Notable for the following:    Troponin I 0.05 (*)    All other components within normal limits  TROPONIN I - Abnormal; Notable for the following:    Troponin I 0.05 (*)    All other components within normal limits    ____________________________________________   EKG  ED ECG REPORT I, Jene Every, the attending physician, personally viewed and interpreted this ECG.   Date: 06/09/2015  EKG Time: 1:54 PM  Rate: 83  Rhythm: normal sinus rhythm  Axis: Normal  Intervals:left anterior fascicular block  ST&T Change: Nonspecific  EKG unchanged from prior ____________________________________________    RADIOLOGY  Chest x-ray unremarkable  ____________________________________________   PROCEDURES  Procedure(s) performed: none  Critical Care performed: none  ____________________________________________   INITIAL IMPRESSION / ASSESSMENT AND PLAN / ED COURSE  Pertinent labs & imaging results that were available during my care of the patient were reviewed by me and considered in my medical decision making (see chart for details).  Patient presents with vague complaint of burning but also concurrent hiccups. He has poorly  controlled blood pressure as well as diabetes. He did have a heart catheterization 2015 which did not show any disease. We will try IV Compazine for his hiccups  Patient had some relief after  treatment. He has an elevated troponin but this appears to be chronically elevated. Regardless we will recheck it given his comorbidities. Again he has no chest pain in the emergency department. EKG is unchanged from prior.  Second troponin is unchanged. Patient is anxious to go home. I feel this is reasonable given workup he feels well and has no interest in admission  ____________________________________________   FINAL CLINICAL IMPRESSION(S) / ED DIAGNOSES  Final diagnoses:  Chest pain, unspecified chest pain type          Jene Everyobert Tifini Reeder, MD 06/09/15 2055

## 2015-06-09 NOTE — ED Notes (Addendum)
Per EMS report, patient has had hiccups x 4days and developed mid-sternal chest pain today. Patient c/o nausea. Patient was given one Nitro SL spray for B/P of 229/145, which lowered to 190/136.

## 2015-06-09 NOTE — Discharge Instructions (Signed)
Nonspecific Chest Pain  °Chest pain can be caused by many different conditions. There is always a chance that your pain could be related to something serious, such as a heart attack or a blood clot in your lungs. Chest pain can also be caused by conditions that are not life-threatening. If you have chest pain, it is very important to follow up with your health care provider. °CAUSES  °Chest pain can be caused by: °· Heartburn. °· Pneumonia or bronchitis. °· Anxiety or stress. °· Inflammation around your heart (pericarditis) or lung (pleuritis or pleurisy). °· A blood clot in your lung. °· A collapsed lung (pneumothorax). It can develop suddenly on its own (spontaneous pneumothorax) or from trauma to the chest. °· Shingles infection (varicella-zoster virus). °· Heart attack. °· Damage to the bones, muscles, and cartilage that make up your chest wall. This can include: °¨ Bruised bones due to injury. °¨ Strained muscles or cartilage due to frequent or repeated coughing or overwork. °¨ Fracture to one or more ribs. °¨ Sore cartilage due to inflammation (costochondritis). °RISK FACTORS  °Risk factors for chest pain may include: °· Activities that increase your risk for trauma or injury to your chest. °· Respiratory infections or conditions that cause frequent coughing. °· Medical conditions or overeating that can cause heartburn. °· Heart disease or family history of heart disease. °· Conditions or health behaviors that increase your risk of developing a blood clot. °· Having had chicken pox (varicella zoster). °SIGNS AND SYMPTOMS °Chest pain can feel like: °· Burning or tingling on the surface of your chest or deep in your chest. °· Crushing, pressure, aching, or squeezing pain. °· Dull or sharp pain that is worse when you move, cough, or take a deep breath. °· Pain that is also felt in your back, neck, shoulder, or arm, or pain that spreads to any of these areas. °Your chest pain may come and go, or it may stay  constant. °DIAGNOSIS °Lab tests or other studies may be needed to find the cause of your pain. Your health care provider may have you take a test called an ambulatory ECG (electrocardiogram). An ECG records your heartbeat patterns at the time the test is performed. You may also have other tests, such as: °· Transthoracic echocardiogram (TTE). During echocardiography, sound waves are used to create a picture of all of the heart structures and to look at how blood flows through your heart. °· Transesophageal echocardiogram (TEE). This is a more advanced imaging test that obtains images from inside your body. It allows your health care provider to see your heart in finer detail. °· Cardiac monitoring. This allows your health care provider to monitor your heart rate and rhythm in real time. °· Holter monitor. This is a portable device that records your heartbeat and can help to diagnose abnormal heartbeats. It allows your health care provider to track your heart activity for several days, if needed. °· Stress tests. These can be done through exercise or by taking medicine that makes your heart beat more quickly. °· Blood tests. °· Imaging tests. °TREATMENT  °Your treatment depends on what is causing your chest pain. Treatment may include: °· Medicines. These may include: °¨ Acid blockers for heartburn. °¨ Anti-inflammatory medicine. °¨ Pain medicine for inflammatory conditions. °¨ Antibiotic medicine, if an infection is present. °¨ Medicines to dissolve blood clots. °¨ Medicines to treat coronary artery disease. °· Supportive care for conditions that do not require medicines. This may include: °¨ Resting. °¨ Applying heat   or cold packs to injured areas. °¨ Limiting activities until pain decreases. °HOME CARE INSTRUCTIONS °· If you were prescribed an antibiotic medicine, finish it all even if you start to feel better. °· Avoid any activities that bring on chest pain. °· Do not use any tobacco products, including  cigarettes, chewing tobacco, or electronic cigarettes. If you need help quitting, ask your health care provider. °· Do not drink alcohol. °· Take medicines only as directed by your health care provider. °· Keep all follow-up visits as directed by your health care provider. This is important. This includes any further testing if your chest pain does not go away. °· If heartburn is the cause for your chest pain, you may be told to keep your head raised (elevated) while sleeping. This reduces the chance that acid will go from your stomach into your esophagus. °· Make lifestyle changes as directed by your health care provider. These may include: °¨ Getting regular exercise. Ask your health care provider to suggest some activities that are safe for you. °¨ Eating a heart-healthy diet. A registered dietitian can help you to learn healthy eating options. °¨ Maintaining a healthy weight. °¨ Managing diabetes, if necessary. °¨ Reducing stress. °SEEK MEDICAL CARE IF: °· Your chest pain does not go away after treatment. °· You have a rash with blisters on your chest. °· You have a fever. °SEEK IMMEDIATE MEDICAL CARE IF:  °· Your chest pain is worse. °· You have an increasing cough, or you cough up blood. °· You have severe abdominal pain. °· You have severe weakness. °· You faint. °· You have chills. °· You have sudden, unexplained chest discomfort. °· You have sudden, unexplained discomfort in your arms, back, neck, or jaw. °· You have shortness of breath at any time. °· You suddenly start to sweat, or your skin gets clammy. °· You feel nauseous or you vomit. °· You suddenly feel light-headed or dizzy. °· Your heart begins to beat quickly, or it feels like it is skipping beats. °These symptoms may represent a serious problem that is an emergency. Do not wait to see if the symptoms will go away. Get medical help right away. Call your local emergency services (911 in the [place] Do not drive yourself to the hospital. °  °This  information is not intended to replace advice given to you by your health care provider. Make sure you discuss any questions you have with your health care provider. °  °Document Released: 10/31/2004 Document Revised: 02/11/2014 Document Reviewed: 08/27/2013 °Elsevier Interactive Patient Education ©2016 [employer] ° °

## 2015-06-09 NOTE — ED Notes (Signed)
Patient transported to lobby via wheelchair. [employer] called by Archivist[employer] Nurse.

## 2015-06-09 NOTE — ED Notes (Signed)
Patient is sleeping. No hiccups are noted.

## 2015-07-01 ENCOUNTER — Emergency Department
Admission: EM | Admit: 2015-07-01 | Discharge: 2015-07-01 | Disposition: A | Discharge status: Home / Self Care | Payer: Medicaid Other | Attending: Emergency Medicine | Admitting: Emergency Medicine

## 2015-07-01 ENCOUNTER — Encounter: Payer: Self-pay | Admitting: Emergency Medicine

## 2015-07-01 ENCOUNTER — Emergency Department: Payer: Medicaid Other

## 2015-07-01 DIAGNOSIS — F319 Bipolar disorder, unspecified: Secondary | ICD-10-CM | POA: Diagnosis not present

## 2015-07-01 DIAGNOSIS — Y929 Unspecified place or not applicable: Secondary | ICD-10-CM | POA: Diagnosis not present

## 2015-07-01 DIAGNOSIS — Z79899 Other long term (current) drug therapy: Secondary | ICD-10-CM | POA: Insufficient documentation

## 2015-07-01 DIAGNOSIS — S0990XA Unspecified injury of head, initial encounter: Secondary | ICD-10-CM

## 2015-07-01 DIAGNOSIS — Y939 Activity, unspecified: Secondary | ICD-10-CM | POA: Insufficient documentation

## 2015-07-01 DIAGNOSIS — I2510 Atherosclerotic heart disease of native coronary artery without angina pectoris: Secondary | ICD-10-CM | POA: Insufficient documentation

## 2015-07-01 DIAGNOSIS — Z8673 Personal history of transient ischemic attack (TIA), and cerebral infarction without residual deficits: Secondary | ICD-10-CM | POA: Insufficient documentation

## 2015-07-01 DIAGNOSIS — E669 Obesity, unspecified: Secondary | ICD-10-CM | POA: Diagnosis not present

## 2015-07-01 DIAGNOSIS — S0993XA Unspecified injury of face, initial encounter: Secondary | ICD-10-CM

## 2015-07-01 DIAGNOSIS — S0181XA Laceration without foreign body of other part of head, initial encounter: Secondary | ICD-10-CM | POA: Diagnosis not present

## 2015-07-01 DIAGNOSIS — Y999 Unspecified external cause status: Secondary | ICD-10-CM | POA: Insufficient documentation

## 2015-07-01 DIAGNOSIS — Z7984 Long term (current) use of oral hypoglycemic drugs: Secondary | ICD-10-CM | POA: Diagnosis not present

## 2015-07-01 DIAGNOSIS — I119 Hypertensive heart disease without heart failure: Secondary | ICD-10-CM | POA: Diagnosis not present

## 2015-07-01 DIAGNOSIS — F17210 Nicotine dependence, cigarettes, uncomplicated: Secondary | ICD-10-CM | POA: Insufficient documentation

## 2015-07-01 DIAGNOSIS — I422 Other hypertrophic cardiomyopathy: Secondary | ICD-10-CM | POA: Diagnosis not present

## 2015-07-01 DIAGNOSIS — S01412A Laceration without foreign body of left cheek and temporomandibular area, initial encounter: Secondary | ICD-10-CM | POA: Insufficient documentation

## 2015-07-01 DIAGNOSIS — Z794 Long term (current) use of insulin: Secondary | ICD-10-CM | POA: Insufficient documentation

## 2015-07-01 DIAGNOSIS — Z7982 Long term (current) use of aspirin: Secondary | ICD-10-CM | POA: Diagnosis not present

## 2015-07-01 DIAGNOSIS — E119 Type 2 diabetes mellitus without complications: Secondary | ICD-10-CM | POA: Insufficient documentation

## 2015-07-01 MED ORDER — HYDROCODONE-ACETAMINOPHEN 5-325 MG PO TABS
1.0000 | ORAL_TABLET | Freq: Once | ORAL | Status: AC
  Administered 2015-07-01: 1 via ORAL
  Filled 2015-07-01: qty 1

## 2015-07-01 MED ORDER — TETANUS-DIPHTH-ACELL PERTUSSIS 5-2.5-18.5 LF-MCG/0.5 IM SUSP
0.5000 mL | Freq: Once | INTRAMUSCULAR | Status: AC
  Administered 2015-07-01: 0.5 mL via INTRAMUSCULAR
  Filled 2015-07-01: qty 0.5

## 2015-07-01 MED ORDER — BACITRACIN ZINC 500 UNIT/GM EX OINT
TOPICAL_OINTMENT | CUTANEOUS | Status: AC
  Filled 2015-07-01: qty 1.8

## 2015-07-01 MED ORDER — CEPHALEXIN 500 MG PO CAPS
500.0000 mg | ORAL_CAPSULE | Freq: Once | ORAL | Status: AC
  Administered 2015-07-01: 500 mg via ORAL
  Filled 2015-07-01: qty 1

## 2015-07-01 MED ORDER — CEPHALEXIN 500 MG PO CAPS: 500.0000 mg | ORAL_CAPSULE | Freq: Two times a day (BID) | ORAL | Status: AC

## 2015-07-01 MED ORDER — HYDROCODONE-ACETAMINOPHEN 5-325 MG PO TABS: 1.0000 | ORAL_TABLET | ORAL | Status: DC | PRN

## 2015-07-01 NOTE — ED Notes (Signed)
Dressing applied to site.
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
## 2015-09-06 ENCOUNTER — Emergency Department
Admission: EM | Admit: 2015-09-06 | Discharge: 2015-09-06 | Disposition: A | Discharge status: Home / Self Care | Payer: Medicaid Other | Attending: Student in an Organized Health Care Education/Training Program | Admitting: Student in an Organized Health Care Education/Training Program

## 2015-09-06 ENCOUNTER — Emergency Department: Payer: Medicaid Other

## 2015-09-06 ENCOUNTER — Encounter: Payer: Self-pay | Admitting: Emergency Medicine

## 2015-09-06 DIAGNOSIS — Z7982 Long term (current) use of aspirin: Secondary | ICD-10-CM | POA: Insufficient documentation

## 2015-09-06 DIAGNOSIS — Z79899 Other long term (current) drug therapy: Secondary | ICD-10-CM | POA: Diagnosis not present

## 2015-09-06 DIAGNOSIS — Z7984 Long term (current) use of oral hypoglycemic drugs: Secondary | ICD-10-CM | POA: Diagnosis not present

## 2015-09-06 DIAGNOSIS — I2510 Atherosclerotic heart disease of native coronary artery without angina pectoris: Secondary | ICD-10-CM | POA: Insufficient documentation

## 2015-09-06 DIAGNOSIS — E119 Type 2 diabetes mellitus without complications: Secondary | ICD-10-CM | POA: Insufficient documentation

## 2015-09-06 DIAGNOSIS — F17210 Nicotine dependence, cigarettes, uncomplicated: Secondary | ICD-10-CM | POA: Diagnosis not present

## 2015-09-06 DIAGNOSIS — I10 Essential (primary) hypertension: Secondary | ICD-10-CM | POA: Diagnosis not present

## 2015-09-06 DIAGNOSIS — Z794 Long term (current) use of insulin: Secondary | ICD-10-CM | POA: Insufficient documentation

## 2015-09-06 DIAGNOSIS — Z791 Long term (current) use of non-steroidal anti-inflammatories (NSAID): Secondary | ICD-10-CM | POA: Diagnosis not present

## 2015-09-06 DIAGNOSIS — R079 Chest pain, unspecified: Secondary | ICD-10-CM

## 2015-09-06 DIAGNOSIS — R0789 Other chest pain: Secondary | ICD-10-CM | POA: Insufficient documentation

## 2015-09-06 LAB — CBC
HCT: 45.2 % (ref 40.0–52.0)
HEMOGLOBIN: 16 g/dL (ref 13.0–18.0)
MCH: 31.5 pg (ref 26.0–34.0)
MCHC: 35.3 g/dL (ref 32.0–36.0)
MCV: 89.3 fL (ref 80.0–100.0)
Platelets: 185 10*3/uL (ref 150–440)
RBC: 5.06 MIL/uL (ref 4.40–5.90)
RDW: 12.2 % (ref 11.5–14.5)
WBC: 9.9 10*3/uL (ref 3.8–10.6)

## 2015-09-06 LAB — COMPREHENSIVE METABOLIC PANEL
ALK PHOS: 61 U/L (ref 38–126)
ALT: 14 U/L — AB (ref 17–63)
AST: 20 U/L (ref 15–41)
Albumin: 4 g/dL (ref 3.5–5.0)
Anion gap: 8 (ref 5–15)
BUN: 21 mg/dL — AB (ref 6–20)
CALCIUM: 8.8 mg/dL — AB (ref 8.9–10.3)
CO2: 25 mmol/L (ref 22–32)
CREATININE: 1.35 mg/dL — AB (ref 0.61–1.24)
Chloride: 102 mmol/L (ref 101–111)
GFR, EST NON AFRICAN AMERICAN: 58 mL/min — AB (ref >60)
Glucose, Bld: 348 mg/dL — ABNORMAL HIGH (ref 65–99)
Potassium: 3.8 mmol/L (ref 3.5–5.1)
SODIUM: 135 mmol/L (ref 135–145)
Total Bilirubin: 1.2 mg/dL (ref 0.3–1.2)
Total Protein: 6.6 g/dL (ref 6.5–8.1)

## 2015-09-06 LAB — TROPONIN I: Troponin I: 0.04 ng/mL (ref <0.03)

## 2015-09-06 MED ORDER — IPRATROPIUM-ALBUTEROL 0.5-2.5 (3) MG/3ML IN SOLN
3.0000 mL | Freq: Once | RESPIRATORY_TRACT | Status: AC
  Administered 2015-09-06: 3 mL via RESPIRATORY_TRACT
  Filled 2015-09-06: qty 3

## 2015-09-06 NOTE — ED Triage Notes (Signed)
Pt arrived via ems from home where he complains of chest pain starting yesterday at 1500. Pt was given 2 NTG spray en route. Pt en no apparent distress.

## 2015-09-06 NOTE — ED Provider Notes (Signed)
[facility] Emergency Department Provider Note    None    (approximate)  I have reviewed the triage vital signs and the nursing notes. 3  HISTORY  Chief Complaint Chest Pain    HPI [name] [name]. is a 55 y.o. male presents with report of chest pain that started yesterday around 2 in the afternoon. Patient well-known to this facility with a history of polysubstance abuse as well as aortic stenosis and bipolar disorder. States that the pain has been constant roughly a 5 out of 10 and nonradiating. Patient states that he had difficulty sleeping last night. When asked what was obtained the pain patient states that he was leaving [facility] for a therapy session and had sudden onset of the pain. Patient states he is not taking any medications for the discomfort while is initially in the chest is now having diffuse pain now radiating to his legs. Patient called EMS to brought to the ER for further evaluation. Denies any nausea, vomiting, diaphoresis, near syncope.  He denies any cocaine abuse on initial H and P obut later admitted to use yesterday prior to onset of chest pain.   Past Medical History:  Diagnosis Date  . Aortic stenosis, mild February 2015  . Auditory hallucinations   . Bipolar disorder (HCC)   . Coronary artery disease    No reported catheterization. Negative Myoview stress test and 2013.  Marland Kitchen Depression   . Diabetes mellitus   . GERD (gastroesophageal reflux disease)   . History of suicide attempt   . Hx of medication noncompliance   . Hypertension   . Hypertensive hypertrophic cardiomyopathy [facility]) February 2015   Severe concentric LVH on echocardiogram.  . Obesity, Class II, BMI 35-39.9, with comorbidity (HCC)   . Pericarditis   . Polysubstance abuse    cocaine, marijuana  . Stroke [facility]) February 2015   Right MCA aneurysm; head MRI - acute infarct of the right frontal lobe. Smaller acute infarct of the left frontal lobe. Thought to be due to  cerebral emboli. Left PICA infarct. Small chronic lacunar infarct right internal capsule. Chronic microvascular ischemic change with white matter change.  . Syncope and collapse     Patient Active Problem List   Diagnosis Date Noted  . 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 Major depressive disorder, recurrent severe without psychotic features (HCC) 11/07/2014  . Tobacco use disorder 11/07/2014  . Stimulant use disorder (HCC) crack cocaine 11/07/2014  . Alcohol use disorder, moderate, dependence (HCC) 11/07/2014  . CAD (coronary artery disease) 11/05/2014  . CVA (cerebral infarction) 10/19/2014  . LVH (left ventricular hypertrophy) 05/20/2013  . Obesity (BMI 30-39.9) 04/10/2013  . Aortic stenosis, mild 03/07/2013  . DM (diabetes mellitus), type 2, uncontrolled (HCC) 01/05/2013  . Essential hypertension, malignant 01/25/2009    Past Surgical History:  Procedure Laterality Date  . Carotid Dopplers:  03/23/2013   Interval thickening of bilateral common carotid no significant plaque formation. Tortuosity but no significant stenosis. Patent bilateral vertebral  arteries  . NM MYOVIEW LTD  July 2013   No ischemia or infarct. There is inferior wall defec suggestive of diaphragmatic attenuation. Mild LV dilation at to be due to hypertensive heart disease. EF 55%. Poor exercise tolerance.  Marland Kitchen PERICARDIAL FLUID DRAINAGE    . TRANSTHORACIC ECHOCARDIOGRAM  03/23/2013   EF 5560%. Elevated left atrial pressure. Severe concentric LVH. Mild aortic stenosis. -- Appearance of hypertensive hypertrophic heart disease.  No clear cardioembolic source  Prior to Admission medications   Medication Sig Start Date End Date Taking? Authorizing Provider  amLODipine (NORVASC) 10 MG tablet Take 1 tablet (10 mg total) by mouth daily. 12/28/14   Antonieta Iba, MD  aspirin EC 81 MG EC tablet Take 1 tablet (81 mg total) by mouth daily. 11/10/14   Jimmy Footman, MD  atorvastatin (LIPITOR) 20 MG tablet Take 1 tablet (20 mg total) by mouth daily at 6 PM. 12/28/14   Antonieta Iba, MD  chlorthalidone (HYGROTON) 25 MG tablet Take 1 tablet (25 mg total) by mouth daily. 11/10/14   Jimmy Footman, MD  cloNIDine (CATAPRES) 0.2 MG tablet Take 1 tablet (0.2 mg total) by mouth 2 (two) times daily. 12/28/14   Antonieta Iba, MD  clopidogrel (PLAVIX) 75 MG tablet Take 1 tablet (75 mg total) by mouth daily. 12/28/14   Antonieta Iba, MD  HYDROcodone-acetaminophen (NORCO/VICODIN) 5-325 MG tablet Take 1 tablet by mouth every 4 (four) hours as needed. 07/01/15   Chinita Pester, FNP  insulin aspart (NOVOLOG) 100 UNIT/ML injection Inject 10 Units into the skin 2 (two) times daily with a meal. 11/10/14   Jimmy Footman, MD  lisinopril (PRINIVIL,ZESTRIL) 40 MG tablet Take 1 tablet (40 mg total) by mouth daily. 12/28/14   Antonieta Iba, MD  meloxicam (MOBIC) 7.5 MG tablet Take 1 tablet (7.5 mg total) by mouth daily. 04/16/15 04/15/16  Sharman Cheek, MD  metFORMIN (GLUCOPHAGE) 500 MG tablet Take 1 tablet (500 mg total) by mouth 2 (two) times daily before a meal. 11/10/14   Jimmy Footman, MD  methocarbamol (ROBAXIN-750) 750 MG tablet Take 2 tablets (1,500 mg total) by mouth 4 (four) times daily. 04/16/15   Sharman Cheek, MD  naproxen (NAPROSYN) 500 MG tablet Take 1 tablet (500 mg total) by mouth 2 (two) times daily with a meal. 04/17/15   Jene Every, MD  nitroGLYCERIN (NITROSTAT) 0.4 MG SL tablet Place 1 tablet (0.4 mg total) under the tongue every 5 (five) minutes as needed for chest pain. 03/10/15   Jimmy Footman, MD  pantoprazole (PROTONIX) 40 MG tablet Take 1 tablet (40 mg total) by mouth daily. 11/10/14   Jimmy Footman, MD  sertraline (ZOLOFT) 100 MG tablet Take 0.5 tablets (50 mg total) by mouth daily. 11/10/14   Jimmy Footman, MD  tadalafil (CIALIS) 20 MG tablet Take 1 tablet (20  mg total) by mouth daily as needed for erectile dysfunction. 12/28/14   Antonieta Iba, MD  traZODone (DESYREL) 150 MG tablet Take 1 tablet (150 mg total) by mouth at bedtime. 11/10/14   Jimmy Footman, MD    Allergies Review of patient's allergies indicates no known allergies.  Family History  Problem Relation Age of Onset  . Stroke Mother   . Diabetes type II Mother   . Hypertension Mother   . Hypertension Father   . Arthritis Father   . Arthritis Sister   . Diabetes type II Sister   . Hypertension Sister     Social History Social History  Substance Use Topics  . Smoking status: Current Every Day Smoker    Packs/day: 0.50    Years: 40.00    Types: Cigars  . Smokeless tobacco: Never Used  . Alcohol use No     Comment: occasionally; reports 1 pint of vodka per month at paydaysocially    Review of Systems Patient denies headaches, rhinorrhea, blurry vision, numbness, shortness of breath, chest pain, edema, cough, abdominal pain, nausea, vomiting, diarrhea, dysuria, fevers, rashes  or hallucinations unless otherwise stated above in HPI. ____________________________________________   PHYSICAL EXAM:  VITAL SIGNS: ED Triage Vitals  Enc Vitals Group     BP 09/06/15 1342 (!) 133/99     Pulse Rate 09/06/15 1342 80     Resp 09/06/15 1342 18     Temp 09/06/15 1342 98 F (36.7 C)     Temp Source 09/06/15 1342 Oral     SpO2 09/06/15 1342 96 %     Weight 09/06/15 1342 250 lb (113.4 kg)     Height 09/06/15 1342 5\' 11"  (1.803 m)     Head Circumference --      Peak Flow --      Pain Score 09/06/15 1343 10     Pain Loc --      Pain Edu? --      Excl. in GC? --     Constitutional: Alert and oriented. Well appearing and in no acute distress. Eyes: Conjunctivae are normal. PERRL. EOMI. Head: Atraumatic. Nose: No congestion/rhinnorhea. Mouth/Throat: Mucous membranes are moist.  Oropharynx non-erythematous. Neck: No stridor. Painless ROM. No cervical spine  tenderness to palpation Hematological/Lymphatic/Immunilogical: No cervical lymphadenopathy. Cardiovascular: Normal rate, regular rhythm. Grossly normal heart sounds.  Good peripheral circulation. Respiratory: Normal respiratory effort.  No retractions. Lungs CTAB. Gastrointestinal: Soft and nontender. No distention. No abdominal bruits. No CVA tenderness. Musculoskeletal: No lower extremity tenderness nor edema.  No joint effusions.  2+ pulses throughout Neurologic:  Normal speech and language. No gross focal neurologic deficits are appreciated. No gait instability. Skin:  Skin is warm, dry and intact. No rash noted. Psychiatric: Mood and affect are normal. Speech and behavior are normal.  ____________________________________________   LABS (all labs ordered are listed, but only abnormal results are displayed)  Labs Reviewed  TROPONIN I - Abnormal; Notable for the following:       Result Value   Troponin I 0.04 (*)    All other components within normal limits  COMPREHENSIVE METABOLIC PANEL - Abnormal; Notable for the following:    Glucose, Bld 348 (*)    BUN 21 (*)    Creatinine, Ser 1.35 (*)    Calcium 8.8 (*)    ALT 14 (*)    GFR calc non Af Amer 58 (*)    All other components within normal limits  CBC   ____________________________________________  EKG  Time:  13:39  Indication: chest pain  Rate: 80 Rhythm: NSR  Axis: left axis  Chronically flipped T waves in lateral leads with non-specific ST changes that are unchanged from previous EKG  ____________________________________________  RADIOLOGY CXR  my read shows no evidence of acute cardiopulmonary process. ____________________________________________   PROCEDURES  Procedure(s) performed: none    Critical Care performed: no ____________________________________________   INITIAL IMPRESSION / ASSESSMENT AND PLAN / ED COURSE  Pertinent labs & imaging results that were available during my care of the patient  were reviewed by me and considered in my medical decision making (see chart for details).  DDX: substance abuse, acs, bronchitis, pna, cocaine abuse, dissection, renal insufficiency  Clinical Course  Patient presents emergency department for evaluation of chest pain is midsternal nonradiating since yesterday evening after admitting to using cocaine. Patient was hemodynamically stable and in no acute distress. 2+ pulses throughout and presentation are consistent with dissection is normal and crisp aortic knob. Does have some mild respiratory night reported coarse breath sounds in the sodium is 1 pack per day smoking patient was given albuterol treatment. EKG with chronic  T-wave inversions but no acute ST elevations or depressions. Troponin checked for concern for ACS in this setting.  Troponin is chronically elevated but actually decreased from previous at 0.04 today. This is also in the setting of chronic renal insufficiency. Based on presentation as patient is hemodynamically stable with no worsening chest pain presentation representative chronic disease pattern, likely worsened by recurrent polysubstance abuse and medical noncompliance. Discussed the importance of abstaining from any polysubstances as well as importance of adherence to his medication regimen. Patient remained nontoxic and well-appearing is stable for outpat3ent management as well as referral to cardiology.  Have discussed with the patient and available family all diagnostics and treatments performed thus far and all questions were answered to the best of my ability. The patient demonstrates understanding and agreement with plan.    ____________________________________________   FINAL CLINICAL IMPRESSION(S) / ED DIAGNOSES  Final diagnoses:  Chest pain, unspecified chest pain type      NEW MEDICATIONS STARTED DURING THIS VISIT:  New Prescriptions   No medications on file     Note:  This document was prepared using Dragon  voice recognition software and may include unintentional dictation errors.    Willy Eddy, MD 09/06/15 [phone]

## 2015-09-25 ENCOUNTER — Emergency Department
Admission: EM | Admit: 2015-09-25 | Discharge: 2015-09-25 | Disposition: A | Discharge status: Home / Self Care | Payer: Medicaid Other | Attending: Emergency Medicine | Admitting: Emergency Medicine

## 2015-09-25 ENCOUNTER — Emergency Department: Payer: Medicaid Other

## 2015-09-25 ENCOUNTER — Encounter: Payer: Self-pay | Admitting: Emergency Medicine

## 2015-09-25 DIAGNOSIS — Z794 Long term (current) use of insulin: Secondary | ICD-10-CM | POA: Insufficient documentation

## 2015-09-25 DIAGNOSIS — I2510 Atherosclerotic heart disease of native coronary artery without angina pectoris: Secondary | ICD-10-CM | POA: Insufficient documentation

## 2015-09-25 DIAGNOSIS — Z7982 Long term (current) use of aspirin: Secondary | ICD-10-CM | POA: Diagnosis not present

## 2015-09-25 DIAGNOSIS — Z79899 Other long term (current) drug therapy: Secondary | ICD-10-CM | POA: Insufficient documentation

## 2015-09-25 DIAGNOSIS — Z7984 Long term (current) use of oral hypoglycemic drugs: Secondary | ICD-10-CM | POA: Insufficient documentation

## 2015-09-25 DIAGNOSIS — E119 Type 2 diabetes mellitus without complications: Secondary | ICD-10-CM | POA: Diagnosis not present

## 2015-09-25 DIAGNOSIS — R072 Precordial pain: Secondary | ICD-10-CM | POA: Insufficient documentation

## 2015-09-25 DIAGNOSIS — I10 Essential (primary) hypertension: Secondary | ICD-10-CM | POA: Insufficient documentation

## 2015-09-25 DIAGNOSIS — F17210 Nicotine dependence, cigarettes, uncomplicated: Secondary | ICD-10-CM | POA: Diagnosis not present

## 2015-09-25 DIAGNOSIS — R079 Chest pain, unspecified: Secondary | ICD-10-CM

## 2015-09-25 LAB — BASIC METABOLIC PANEL
Anion gap: 7 (ref 5–15)
BUN: 21 mg/dL — AB (ref 6–20)
CALCIUM: 9 mg/dL (ref 8.9–10.3)
CHLORIDE: 101 mmol/L (ref 101–111)
CO2: 23 mmol/L (ref 22–32)
CREATININE: 1.33 mg/dL — AB (ref 0.61–1.24)
GFR calc non Af Amer: 59 mL/min — ABNORMAL LOW (ref >60)
Glucose, Bld: 395 mg/dL — ABNORMAL HIGH (ref 65–99)
Potassium: 3.9 mmol/L (ref 3.5–5.1)
SODIUM: 131 mmol/L — AB (ref 135–145)

## 2015-09-25 LAB — CBC
HCT: 45.3 % (ref 40.0–52.0)
Hemoglobin: 16 g/dL (ref 13.0–18.0)
MCH: 31.9 pg (ref 26.0–34.0)
MCHC: 35.3 g/dL (ref 32.0–36.0)
MCV: 90.6 fL (ref 80.0–100.0)
PLATELETS: 188 10*3/uL (ref 150–440)
RBC: 5 MIL/uL (ref 4.40–5.90)
RDW: 12.5 % (ref 11.5–14.5)
WBC: 9.2 10*3/uL (ref 3.8–10.6)

## 2015-09-25 LAB — TROPONIN I
TROPONIN I: 0.04 ng/mL — AB (ref <0.03)
Troponin I: 0.04 ng/mL (ref <0.03)

## 2015-09-25 NOTE — Discharge Instructions (Signed)
Although no certain cause was found for your chest pain, your examine evaluation are reassuring in the emergency department today.  You need to follow up with a cardiologist, and since he has seen Dr. Herbie BaltimoreHarding in the past, please call the office.  Return to the emergency room for any worsening condition including chest pain, trouble breathing, nausea, sweating, dizziness or passing out, or any other symptoms concerning to you.

## 2015-09-25 NOTE — ED Provider Notes (Signed)
[facility] Emergency Department Provider Note ____________________________________________   I have reviewed the triage vital signs and the triage nursing note.  HISTORY  Chief Complaint Chest Pain   Historian Patient  HPI [name] Michaell Cowingride Jr. is a 55 y.o. male with a history of prior nonspecific chest pain as well as cocaine/polysubstance abuse, prior stroke with residual left sided weakness and slurred speech, who lives alone, presents today for chest pain. Located just right of sternum, constant but waxing and waning since Saturday, 2 days ago.  Worse this morning around 9-10 am with nausea and emesis.  No diarrhea.  No abd pain.  No burping or belching.  Denies recent cocaine use.  No shortness of breath, coughing or trouble breathing.  Pain currently mild-moderate.  Not pleuritic.  No fevers.  Reports having seen Dr. Herbie BaltimoreHarding in the past, but not anytime recently.  Doesn't think he's had a stress test.    Past Medical History:  Diagnosis Date  . Aortic stenosis, mild February 2015  . Auditory hallucinations   . Bipolar disorder (HCC)   . Coronary artery disease    No reported catheterization. Negative Myoview stress test and 2013.  Marland Kitchen. Depression   . Diabetes mellitus   . GERD (gastroesophageal reflux disease)   . History of suicide attempt   . Hx of medication noncompliance   . Hypertension   . Hypertensive hypertrophic cardiomyopathy [facility](HCC) February 2015   Severe concentric LVH on echocardiogram.  . Obesity, Class II, BMI 35-39.9, with comorbidity (HCC)   . Pericarditis   . Polysubstance abuse    cocaine, marijuana  . Stroke [facility](HCC) February 2015   Right MCA aneurysm; head MRI - acute infarct of the right frontal lobe. Smaller acute infarct of the left frontal lobe. Thought to be due to cerebral emboli. Left PICA infarct. Small chronic lacunar infarct right internal capsule. Chronic microvascular ischemic change with white matter change.  . Syncope and  collapse     Patient Active Problem List   Diagnosis Date Noted  . 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 Major depressive disorder, recurrent severe without psychotic features (HCC) 11/07/2014  . Tobacco use disorder 11/07/2014  . Stimulant use disorder (HCC) crack cocaine 11/07/2014  . Alcohol use disorder, moderate, dependence (HCC) 11/07/2014  . CAD (coronary artery disease) 11/05/2014  . CVA (cerebral infarction) 10/19/2014  . LVH (left ventricular hypertrophy) 05/20/2013  . Obesity (BMI 30-39.9) 04/10/2013  . Aortic stenosis, mild 03/07/2013  . DM (diabetes mellitus), type 2, uncontrolled (HCC) 01/05/2013  . Essential hypertension, malignant 01/25/2009    Past Surgical History:  Procedure Laterality Date  . Carotid Dopplers:  03/23/2013   Interval thickening of bilateral common carotid no significant plaque formation. Tortuosity but no significant stenosis. Patent bilateral vertebral  arteries  . NM MYOVIEW LTD  July 2013   No ischemia or infarct. There is inferior wall defec suggestive of diaphragmatic attenuation. Mild LV dilation at to be due to hypertensive heart disease. EF 55%. Poor exercise tolerance.  Marland Kitchen. PERICARDIAL FLUID DRAINAGE    . TRANSTHORACIC ECHOCARDIOGRAM  03/23/2013   EF 5560%. Elevated left atrial pressure. Severe concentric LVH. Mild aortic stenosis. -- Appearance of hypertensive hypertrophic heart disease.  No clear cardioembolic source    Prior to Admission medications   Medication Sig Start Date End Date Taking? Authorizing Provider  amLODipine (NORVASC) 10 MG tablet Take 1 tablet (10 mg total) by mouth daily. 12/28/14   Antonieta Ibaimothy J Gollan, MD  aspirin EC 81 MG EC tablet Take 1 tablet (81 mg total) by mouth daily. 11/10/14   Jimmy Footman, MD  atorvastatin (LIPITOR) 20 MG tablet Take 1 tablet (20 mg total) by mouth daily at 6 PM. 12/28/14   Antonieta Iba, MD  chlorthalidone (HYGROTON) 25 MG  tablet Take 1 tablet (25 mg total) by mouth daily. 11/10/14   Jimmy Footman, MD  cloNIDine (CATAPRES) 0.2 MG tablet Take 1 tablet (0.2 mg total) by mouth 2 (two) times daily. 12/28/14   Antonieta Iba, MD  clopidogrel (PLAVIX) 75 MG tablet Take 1 tablet (75 mg total) by mouth daily. 12/28/14   Antonieta Iba, MD  HYDROcodone-acetaminophen (NORCO/VICODIN) 5-325 MG tablet Take 1 tablet by mouth every 4 (four) hours as needed. 07/01/15   Chinita Pester, FNP  insulin aspart (NOVOLOG) 100 UNIT/ML injection Inject 10 Units into the skin 2 (two) times daily with a meal. 11/10/14   Jimmy Footman, MD  lisinopril (PRINIVIL,ZESTRIL) 40 MG tablet Take 1 tablet (40 mg total) by mouth daily. 12/28/14   Antonieta Iba, MD  meloxicam (MOBIC) 7.5 MG tablet Take 1 tablet (7.5 mg total) by mouth daily. 04/16/15 04/15/16  Sharman Cheek, MD  metFORMIN (GLUCOPHAGE) 500 MG tablet Take 1 tablet (500 mg total) by mouth 2 (two) times daily before a meal. 11/10/14   Jimmy Footman, MD  methocarbamol (ROBAXIN-750) 750 MG tablet Take 2 tablets (1,500 mg total) by mouth 4 (four) times daily. 04/16/15   Sharman Cheek, MD  naproxen (NAPROSYN) 500 MG tablet Take 1 tablet (500 mg total) by mouth 2 (two) times daily with a meal. 04/17/15   Jene Every, MD  nitroGLYCERIN (NITROSTAT) 0.4 MG SL tablet Place 1 tablet (0.4 mg total) under the tongue every 5 (five) minutes as needed for chest pain. 03/10/15   Jimmy Footman, MD  pantoprazole (PROTONIX) 40 MG tablet Take 1 tablet (40 mg total) by mouth daily. 11/10/14   Jimmy Footman, MD  sertraline (ZOLOFT) 100 MG tablet Take 0.5 tablets (50 mg total) by mouth daily. 11/10/14   Jimmy Footman, MD  tadalafil (CIALIS) 20 MG tablet Take 1 tablet (20 mg total) by mouth daily as needed for erectile dysfunction. 12/28/14   Antonieta Iba, MD  traZODone (DESYREL) 150 MG tablet Take 1 tablet (150 mg total) by mouth at bedtime.  11/10/14   Jimmy Footman, MD    No Known Allergies  Family History  Problem Relation Age of Onset  . Stroke Mother   . Diabetes type II Mother   . Hypertension Mother   . Hypertension Father   . Arthritis Father   . Arthritis Sister   . Diabetes type II Sister   . Hypertension Sister     Social History Social History  Substance Use Topics  . Smoking status: Current Every Day Smoker    Packs/day: 0.50    Years: 40.00    Types: Cigars  . Smokeless tobacco: Never Used  . Alcohol use No     Comment: occasionally; reports 1 pint of vodka per month at paydaysocially    Review of Systems  Constitutional: Negative for fever. Eyes: Negative for visual changes. ENT: Negative for sore throat. Cardiovascular: Positive for chest pain. Respiratory: Negative for shortness of breath. Gastrointestinal: Negative for abdominal pain, vomiting and diarrhea. Genitourinary: Negative for dysuria. Musculoskeletal: Negative for back pain. Skin: Negative for rash. Neurological: Negative for headache. 10 point Review of Systems otherwise negative ____________________________________________   PHYSICAL EXAM:  VITAL SIGNS:  ED Triage Vitals  Enc Vitals Group     BP 09/25/15 1035 109/86     Pulse Rate 09/25/15 1034 85     Resp 09/25/15 1034 20     Temp 09/25/15 1034 98.5 F (36.9 C)     Temp Source 09/25/15 1034 Oral     SpO2 --      Weight 09/25/15 1036 245 lb (111.1 kg)     Height 09/25/15 1036 5\' 11"  (1.803 m)     Head Circumference --      Peak Flow --      Pain Score 09/25/15 1035 9     Pain Loc --      Pain Edu? --      Excl. in GC? --      Constitutional: Alert and oriented. Well appearing and in no distress. HEENT   Head: Normocephalic and atraumatic.      Eyes: Conjunctivae are normal. PERRL. Normal extraocular movements.      Ears:         Nose: No congestion/rhinnorhea.   Mouth/Throat: Mucous membranes are moist.   Neck: No  stridor. Cardiovascular/Chest: Normal rate, regular rhythm.  No murmurs, rubs, or gallops. Respiratory: Normal respiratory effort without tachypnea nor retractions. Breath sounds are clear and equal bilaterally. No wheezes/rales/rhonchi. Gastrointestinal: Soft. No distention, no guarding, no rebound. Nontender.    Genitourinary/rectal:Deferred Musculoskeletal: Nontender with normal range of motion in all extremities. No joint effusions.  No lower extremity tenderness.  No edema. Neurologic: Slight slurred speech, no facial droop. Mild left sided weakness.   Skin:  Skin is warm, dry and intact. No rash noted. Psychiatric: Mood and affect are normal. Speech and behavior are normal. Patient exhibits appropriate insight and judgment.  ____________________________________________   EKG I, Governor Rooksebecca Shadasia Oldfield, MD, the attending physician have personally viewed and interpreted all ECGs.  89 bpm. Normal sinus rhythm. Biatrial enlargement. Left anterior fascicular block. Left axis deviation. Nonspecific ST/T waves with T waves inverted laterally, which is similar to prior EKG ____________________________________________  LABS (pertinent positives/negatives)  Labs Reviewed  BASIC METABOLIC PANEL - Abnormal; Notable for the following:       Result Value   Sodium 131 (*)    Glucose, Bld 395 (*)    BUN 21 (*)    Creatinine, Ser 1.33 (*)    GFR calc non Af Amer 59 (*)    All other components within normal limits  TROPONIN I - Abnormal; Notable for the following:    Troponin I 0.04 (*)    All other components within normal limits  TROPONIN I - Abnormal; Notable for the following:    Troponin I 0.04 (*)    All other components within normal limits  CBC    ____________________________________________  RADIOLOGY All Xrays were viewed by me. Imaging interpreted by Radiologist.  Chest x-ray two-view: No active cardiopulmonary  disease. __________________________________________  PROCEDURES  Procedure(s) performed: None  Critical Care performed: None  ____________________________________________   ED COURSE / ASSESSMENT AND PLAN  Pertinent labs & imaging results that were available during my care of the patient were reviewed by me and considered in my medical decision making (see chart for details).   [name] [name] is here with st pain, worse trning just prior to arrival with associated nausea and vomiting 1. His EKG is abnormal, but similar to prior. History of chronic kidney disease and his kidney function is similar to prior minimal elevated troponin, initial troponin 0.04.  Patient chart review reveals history of  prior presentations for chest pain including prior cocaine abuse, of which he denies use over past few days.  Symptoms not clinically consistent with respiratory process.    Patient is overall well appearing. Given worsening episode that ultimately led to his presentation today, just about an hour prior to arrival, I will send a repeat troponin 3 hours.  He has been referred in the past to cardiology, but no recent evaluation. Patient states he has seen Dr. Herbie Baltimore, and I will refer him to call Dr. Elissa Hefty office today and make an appointment for the next 2 days.    CONSULTATIONS:   None   Patient / Family / Caregiver informed of clinical course, medical decision-making process, and agree with plan.   I discussed return precautions, follow-up instructions, and discharged instructions with patient and/or family.   ___________________________________________   FINAL CLINICAL IMPRESSION(S) / ED DIAGNOSES   Final diagnoses:  Nonspecific chest pain              Note: This dictation was prepared with Dragon dictation. Any transcriptional errors that result from this process are unintentional    Governor Rooks, MD 09/25/15 1426

## 2015-09-25 NOTE — ED Triage Notes (Signed)
Pt to ed via ems from home with mid sternal chest pain started around 3 am this morning. Pt with hx of stroke with left side deficits and some slurred speech which is not new. Pt a/ox3 on arrival to ED with no acute distress noted. Protocols initiated.  Ems reports cbg 331, 134/90 and pt was given 324mg  Aspirin. Pt denies any dizziness.

## 2015-10-02 DIAGNOSIS — I5032 Chronic diastolic (congestive) heart failure: Secondary | ICD-10-CM | POA: Insufficient documentation

## 2015-10-10 ENCOUNTER — Ambulatory Visit: Payer: Medicaid Other | Admitting: Cardiology

## 2015-10-17 NOTE — Progress Notes (Deleted)
Cardiology Office Note Date:  10/17/2015  Patient ID:  [name] Bhatnagar., DOB [birth year], MRN [id number] PCP:  Presley Raddle, MD  Cardiologist:  Dr. Mariah Milling, MD  ***refresh   Chief Complaint: ED follow up  History of Present Illness: [name] Casasola. is a 55 y.o. male with history of nonobstructive CAD by cardiac cath in 2015, prior strokes, polysubstance abuse with cocaine, uncontrolled DM2, HTN, HLD, obesity, and chronic chest pain who presents for routine follow up of his HTN.   Prior stroke in 03/2013 in the setting of maligant HTN. Patient refused TEE evaluation at that time. Urine drug screen was positive for cocaine and MDMA. Admitted for intractable headache in 10/2014. MRI negative for acute stroke. TTE from 10/2014 showed normal EF with severe concentric LVH, DD, PASP normal. Unable to excluded ASD/PFO. Admitted again in 11/2014 for atypical chest pain, ruled, no furher cardiac workup advised at that time. He was noted to have malignant HTN as well as positive drug screen for cocaine. Echo was repeated (had prior 2 weeks earlier) that unchanged. He has since been seen in the ED for multiple issues including SI, intractable HA, and chronic pain issues. Most recently seen in the ED 8/2 and 8/21 for atypical chest pain in the setting of ongoing cocaine abuse. Tropoinn nonacute both visits. Advised to follow up with [place].   ***   Past Medical History:  Diagnosis Date  . Aortic stenosis, mild February 2015  . Auditory hallucinations   . Bipolar disorder (HCC)   . Coronary artery disease    No reported catheterization. Negative Myoview stress test and 2013.  Marland Kitchen Depression   . Diabetes mellitus   . GERD (gastroesophageal reflux disease)   . History of suicide attempt   . Hx of medication noncompliance   . Hypertension   . Hypertensive hypertrophic cardiomyopathy [facility]) February 2015   Severe concentric LVH on echocardiogram.  . Obesity, Class II, BMI 35-39.9, with comorbidity (HCC)    . Pericarditis   . Polysubstance abuse    cocaine, marijuana  . Stroke [facility]) February 2015   Right MCA aneurysm; head MRI - acute infarct of the right frontal lobe. Smaller acute infarct of the left frontal lobe. Thought to be due to cerebral emboli. Left PICA infarct. Small chronic lacunar infarct right internal capsule. Chronic microvascular ischemic change with white matter change.  . Syncope and collapse     Past Surgical History:  Procedure Laterality Date  . Carotid Dopplers:  03/23/2013   Interval thickening of bilateral common carotid no significant plaque formation. Tortuosity but no significant stenosis. Patent bilateral vertebral  arteries  . NM MYOVIEW LTD  July 2013   No ischemia or infarct. There is inferior wall defec suggestive of diaphragmatic attenuation. Mild LV dilation at to be due to hypertensive heart disease. EF 55%. Poor exercise tolerance.  Marland Kitchen PERICARDIAL FLUID DRAINAGE    . TRANSTHORACIC ECHOCARDIOGRAM  03/23/2013   EF 5560%. Elevated left atrial pressure. Severe concentric LVH. Mild aortic stenosis. -- Appearance of hypertensive hypertrophic heart disease.  No clear cardioembolic source    Current Outpatient Prescriptions  Medication Sig Dispense Refill  . amLODipine (NORVASC) 10 MG tablet Take 1 tablet (10 mg total) by mouth daily. 30 tablet 6  . aspirin EC 81 MG EC tablet Take 1 tablet (81 mg total) by mouth daily. 30 tablet 0  . atorvastatin (LIPITOR) 20 MG tablet Take 1 tablet (20 mg total) by mouth daily at 6 PM. 30 tablet 6  .  chlorthalidone (HYGROTON) 25 MG tablet Take 1 tablet (25 mg total) by mouth daily. 30 tablet 0  . cloNIDine (CATAPRES) 0.2 MG tablet Take 1 tablet (0.2 mg total) by mouth 2 (two) times daily. 60 tablet 6  . clopidogrel (PLAVIX) 75 MG tablet Take 1 tablet (75 mg total) by mouth daily. 30 tablet 6  . HYDROcodone-acetaminophen (NORCO/VICODIN) 5-325 MG tablet Take 1 tablet by mouth every 4 (four) hours as needed. 12 tablet 0  . insulin  aspart (NOVOLOG) 100 UNIT/ML injection Inject 10 Units into the skin 2 (two) times daily with a meal. 30 mL 0  . lisinopril (PRINIVIL,ZESTRIL) 40 MG tablet Take 1 tablet (40 mg total) by mouth daily. 30 tablet 6  . meloxicam (MOBIC) 7.5 MG tablet Take 1 tablet (7.5 mg total) by mouth daily. 7 tablet 0  . metFORMIN (GLUCOPHAGE) 500 MG tablet Take 1 tablet (500 mg total) by mouth 2 (two) times daily before a meal. 60 tablet 0  . methocarbamol (ROBAXIN-750) 750 MG tablet Take 2 tablets (1,500 mg total) by mouth 4 (four) times daily. 24 tablet 0  . naproxen (NAPROSYN) 500 MG tablet Take 1 tablet (500 mg total) by mouth 2 (two) times daily with a meal. 20 tablet 2  . nitroGLYCERIN (NITROSTAT) 0.4 MG SL tablet Place 1 tablet (0.4 mg total) under the tongue every 5 (five) minutes as needed for chest pain. 30 tablet 12  . pantoprazole (PROTONIX) 40 MG tablet Take 1 tablet (40 mg total) by mouth daily. 30 tablet 0  . sertraline (ZOLOFT) 100 MG tablet Take 0.5 tablets (50 mg total) by mouth daily. 30 tablet 0  . tadalafil (CIALIS) 20 MG tablet Take 1 tablet (20 mg total) by mouth daily as needed for erectile dysfunction. 3 tablet 5  . traZODone (DESYREL) 150 MG tablet Take 1 tablet (150 mg total) by mouth at bedtime. 30 tablet 0   No current facility-administered medications for this visit.     Allergies:   Review of patient's allergies indicates no known allergies.   Social History:  The patient  reports that he has been smoking Cigars.  He has a 20.00 pack-year smoking history. He has never used smokeless tobacco. He reports that he does not drink alcohol or use drugs.   Family History:  The patient's family history includes Arthritis in his father and sister; Diabetes type II in his mother and sister; Hypertension in his father, mother, and sister; Stroke in his mother.  ROS:   ROS   PHYSICAL EXAM: *** VS:  There were no vitals taken for this visit. BMI: There is no height or weight on file to  calculate BMI.  Physical Exam   EKG:  Was ordered and interpreted by me today. Shows ***  Recent Labs: 11/05/2014: Magnesium 2.0 03/09/2015: TSH 0.557 09/06/2015: ALT 14 09/25/2015: BUN 21; Creatinine, Ser 1.33; Hemoglobin 16.0; Platelets 188; Potassium 3.9; Sodium 131  03/09/2015: Cholesterol 113; HDL 29; LDL Cholesterol 69; Total CHOL/HDL Ratio 3.9; Triglycerides 75; VLDL 15   CrCl cannot be calculated (Unknown ideal weight.).   Wt Readings from Last 3 Encounters:  09/25/15 245 lb (111.1 kg)  09/06/15 250 lb (113.4 kg)  07/01/15 245 lb (111.1 kg)     Other studies reviewed: Additional studies/records reviewed today include: summarized above  ASSESSMENT AND PLAN:  1. ***  Disposition: F/u with *** in   Current medicines are reviewed at length with the patient today.  The patient did not have any concerns regarding medicines.  Elinor Dodge PA-C 10/17/2015 5:09 PM     [facility] [address] [place], [place] [address] [phone]

## 2015-10-18 ENCOUNTER — Ambulatory Visit: Payer: Medicaid Other | Admitting: Physician Assistant

## 2015-11-27 ENCOUNTER — Emergency Department: Payer: Medicaid Other

## 2015-11-27 ENCOUNTER — Emergency Department
Admission: EM | Admit: 2015-11-27 | Discharge: 2015-11-27 | Disposition: A | Discharge status: Home / Self Care | Payer: Medicaid Other | Attending: Emergency Medicine | Admitting: Emergency Medicine

## 2015-11-27 ENCOUNTER — Encounter: Payer: Self-pay | Admitting: Emergency Medicine

## 2015-11-27 DIAGNOSIS — Z79899 Other long term (current) drug therapy: Secondary | ICD-10-CM | POA: Insufficient documentation

## 2015-11-27 DIAGNOSIS — Z794 Long term (current) use of insulin: Secondary | ICD-10-CM | POA: Insufficient documentation

## 2015-11-27 DIAGNOSIS — K760 Fatty (change of) liver, not elsewhere classified: Secondary | ICD-10-CM | POA: Diagnosis not present

## 2015-11-27 DIAGNOSIS — R519 Headache, unspecified: Secondary | ICD-10-CM

## 2015-11-27 DIAGNOSIS — E119 Type 2 diabetes mellitus without complications: Secondary | ICD-10-CM | POA: Insufficient documentation

## 2015-11-27 DIAGNOSIS — Z87891 Personal history of nicotine dependence: Secondary | ICD-10-CM | POA: Insufficient documentation

## 2015-11-27 DIAGNOSIS — R079 Chest pain, unspecified: Secondary | ICD-10-CM | POA: Diagnosis present

## 2015-11-27 DIAGNOSIS — I160 Hypertensive urgency: Secondary | ICD-10-CM | POA: Insufficient documentation

## 2015-11-27 DIAGNOSIS — I2510 Atherosclerotic heart disease of native coronary artery without angina pectoris: Secondary | ICD-10-CM | POA: Insufficient documentation

## 2015-11-27 DIAGNOSIS — R51 Headache: Secondary | ICD-10-CM | POA: Insufficient documentation

## 2015-11-27 DIAGNOSIS — Z7982 Long term (current) use of aspirin: Secondary | ICD-10-CM | POA: Diagnosis not present

## 2015-11-27 LAB — CBC
HCT: 52 % (ref 40.0–52.0)
HEMOGLOBIN: 18.4 g/dL — AB (ref 13.0–18.0)
MCH: 32.2 pg (ref 26.0–34.0)
MCHC: 35.4 g/dL (ref 32.0–36.0)
MCV: 90.9 fL (ref 80.0–100.0)
Platelets: 171 10*3/uL (ref 150–440)
RBC: 5.71 MIL/uL (ref 4.40–5.90)
RDW: 12.8 % (ref 11.5–14.5)
WBC: 10.8 10*3/uL — ABNORMAL HIGH (ref 3.8–10.6)

## 2015-11-27 LAB — COMPREHENSIVE METABOLIC PANEL
ALK PHOS: 81 U/L (ref 38–126)
ALT: 22 U/L (ref 17–63)
ANION GAP: 8 (ref 5–15)
AST: 19 U/L (ref 15–41)
Albumin: 4.7 g/dL (ref 3.5–5.0)
BILIRUBIN TOTAL: 1.1 mg/dL (ref 0.3–1.2)
BUN: 20 mg/dL (ref 6–20)
CALCIUM: 9.8 mg/dL (ref 8.9–10.3)
CO2: 28 mmol/L (ref 22–32)
CREATININE: 1.36 mg/dL — AB (ref 0.61–1.24)
Chloride: 96 mmol/L — ABNORMAL LOW (ref 101–111)
GFR, EST NON AFRICAN AMERICAN: 57 mL/min — AB (ref >60)
Glucose, Bld: 350 mg/dL — ABNORMAL HIGH (ref 65–99)
Potassium: 4.2 mmol/L (ref 3.5–5.1)
SODIUM: 132 mmol/L — AB (ref 135–145)
TOTAL PROTEIN: 8.5 g/dL — AB (ref 6.5–8.1)

## 2015-11-27 LAB — URINE DRUG SCREEN, QUALITATIVE (ARMC ONLY)
Amphetamines, Ur Screen: NOT DETECTED
BENZODIAZEPINE, UR SCRN: NOT DETECTED
Barbiturates, Ur Screen: NOT DETECTED
CANNABINOID 50 NG, UR ~~LOC~~: NOT DETECTED
Cocaine Metabolite,Ur ~~LOC~~: NOT DETECTED
MDMA (Ecstasy)Ur Screen: NOT DETECTED
Methadone Scn, Ur: NOT DETECTED
OPIATE, UR SCREEN: NOT DETECTED
PHENCYCLIDINE (PCP) UR S: NOT DETECTED
Tricyclic, Ur Screen: NOT DETECTED

## 2015-11-27 LAB — C-REACTIVE PROTEIN

## 2015-11-27 LAB — SEDIMENTATION RATE: SED RATE: 1 mm/h (ref 0–20)

## 2015-11-27 LAB — TROPONIN I
TROPONIN I: 0.04 ng/mL — AB (ref <0.03)
TROPONIN I: 0.04 ng/mL — AB (ref <0.03)

## 2015-11-27 MED ORDER — DIPHENHYDRAMINE HCL 50 MG/ML IJ SOLN
25.0000 mg | Freq: Once | INTRAMUSCULAR | Status: AC
  Administered 2015-11-27: 25 mg via INTRAVENOUS
  Filled 2015-11-27: qty 1

## 2015-11-27 MED ORDER — BUTALBITAL-APAP-CAFFEINE 50-325-40 MG PO TABS
2.0000 | ORAL_TABLET | Freq: Once | ORAL | Status: AC
  Administered 2015-11-27: 2 via ORAL
  Filled 2015-11-27: qty 2

## 2015-11-27 MED ORDER — KETOROLAC TROMETHAMINE 30 MG/ML IJ SOLN
30.0000 mg | Freq: Once | INTRAMUSCULAR | Status: AC
  Administered 2015-11-27: 30 mg via INTRAVENOUS
  Filled 2015-11-27: qty 1

## 2015-11-27 MED ORDER — SODIUM CHLORIDE 0.9 % IV BOLUS (SEPSIS)
1000.0000 mL | Freq: Once | INTRAVENOUS | Status: AC
  Administered 2015-11-27: 1000 mL via INTRAVENOUS

## 2015-11-27 MED ORDER — METOCLOPRAMIDE HCL 5 MG/ML IJ SOLN
10.0000 mg | Freq: Once | INTRAMUSCULAR | Status: AC
  Administered 2015-11-27: 10 mg via INTRAVENOUS
  Filled 2015-11-27: qty 2

## 2015-11-27 MED ORDER — IOPAMIDOL (ISOVUE-370) INJECTION 76%
100.0000 mL | Freq: Once | INTRAVENOUS | Status: AC | PRN
  Administered 2015-11-27: 100 mL via INTRAVENOUS

## 2015-11-27 MED ORDER — LORAZEPAM 1 MG PO TABS
1.0000 mg | ORAL_TABLET | Freq: Once | ORAL | Status: AC
  Administered 2015-11-27: 1 mg via ORAL
  Filled 2015-11-27: qty 1

## 2015-11-27 MED ORDER — HYDRALAZINE HCL 20 MG/ML IJ SOLN
10.0000 mg | Freq: Once | INTRAMUSCULAR | Status: AC
  Administered 2015-11-27: 10 mg via INTRAVENOUS
  Filled 2015-11-27: qty 1

## 2015-11-27 NOTE — ED Provider Notes (Signed)
-----------------------------------------   9:24 AM on 11/27/2015 -----------------------------------------  Patient's repeat troponin is negative. I discussed these results with the patient. Patient initially sleeping comfortably. Upon awakening States the headache is gone currently. We will discharge the patient home with PCP follow-up. Patient is agreeable to this plan.   [name] AntisKevin Olson Lucarelli, MD 11/27/15 [phone]

## 2015-11-27 NOTE — ED Provider Notes (Signed)
[facility] Emergency Department Provider Note   ____________________________________________   First MD Initiated Contact with Patient 11/27/15 0503     (approximate)  I have reviewed the triage vital signs and the nursing notes.   HISTORY  Chief Complaint Chest Pain and Headache    HPI [name] [name]. is a 55 y.o. male who comes into the hospital today with a headache and chest pain. The patient reports that the headache started around midnight. It was hurting yesterday but got worse behind his left eye. He reports that he's had a stroke on that left side a year ago. He reports that the chest pain started 2 hours ago around 3 AM. He reports that he had been sleeping and when he woke up everything seemed to be worse. The patient took some Advil for the pain but it did not help. The patient has not had any nausea vomiting or sweats. He reports that when he had a stroke he had some headache and dizziness. He feels weak on the left side. The patient rates his pain a 10 out of 10 in intensity. He says the pain radiates into his back. He has some chest tightness and some mild shortness of breath. He reports that he did take his blood pressure medicines when he woke up as well. He is here for evaluation.   Past Medical History:  Diagnosis Date  . Aortic stenosis, mild February 2015  . Auditory hallucinations   . Bipolar disorder (HCC)   . Coronary artery disease    No reported catheterization. Negative Myoview stress test and 2013.  Marland Kitchen Depression   . Diabetes mellitus   . GERD (gastroesophageal reflux disease)   . History of suicide attempt   . Hx of medication noncompliance   . Hypertension   . Hypertensive hypertrophic cardiomyopathy [facility]) February 2015   Severe concentric LVH on echocardiogram.  . Obesity, Class II, BMI 35-39.9, with comorbidity   . Pericarditis   . Polysubstance abuse    cocaine, marijuana  . Stroke [facility]) February 2015   Right MCA  aneurysm; head MRI - acute infarct of the right frontal lobe. Smaller acute infarct of the left frontal lobe. Thought to be due to cerebral emboli. Left PICA infarct. Small chronic lacunar infarct right internal capsule. Chronic microvascular ischemic change with white matter change.  . Syncope and collapse     Patient Active Problem List   Diagnosis Date Noted  . 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 Major depressive disorder, recurrent severe without psychotic features (HCC) 11/07/2014  . Tobacco use disorder 11/07/2014  . Stimulant use disorder (HCC) crack cocaine 11/07/2014  . Alcohol use disorder, moderate, dependence (HCC) 11/07/2014  . CAD (coronary artery disease) 11/05/2014  . CVA (cerebral infarction) 10/19/2014  . LVH (left ventricular hypertrophy) 05/20/2013  . Obesity (BMI 30-39.9) 04/10/2013  . Aortic stenosis, mild 03/07/2013  . DM (diabetes mellitus), type 2, uncontrolled (HCC) 01/05/2013  . Essential hypertension, malignant 01/25/2009    Past Surgical History:  Procedure Laterality Date  . Carotid Dopplers:  03/23/2013   Interval thickening of bilateral common carotid no significant plaque formation. Tortuosity but no significant stenosis. Patent bilateral vertebral  arteries  . NM MYOVIEW LTD  July 2013   No ischemia or infarct. There is inferior wall defec suggestive of diaphragmatic attenuation. Mild LV dilation at to be due to hypertensive heart disease. EF 55%. Poor exercise tolerance.  Marland Kitchen PERICARDIAL FLUID DRAINAGE    .  TRANSTHORACIC ECHOCARDIOGRAM  03/23/2013   EF 5560%. Elevated left atrial pressure. Severe concentric LVH. Mild aortic stenosis. -- Appearance of hypertensive hypertrophic heart disease.  No clear cardioembolic source    Prior to Admission medications   Medication Sig Start Date End Date Taking? Authorizing Provider  amLODipine (NORVASC) 10 MG tablet Take 1 tablet (10 mg total) by mouth daily.  12/28/14   Antonieta Ibaimothy J Gollan, MD  aspirin EC 81 MG EC tablet Take 1 tablet (81 mg total) by mouth daily. 11/10/14   Jimmy FootmanAndrea Hernandez-Gonzalez, MD  atorvastatin (LIPITOR) 20 MG tablet Take 1 tablet (20 mg total) by mouth daily at 6 PM. 12/28/14   Antonieta Ibaimothy J Gollan, MD  chlorthalidone (HYGROTON) 25 MG tablet Take 1 tablet (25 mg total) by mouth daily. 11/10/14   Jimmy FootmanAndrea Hernandez-Gonzalez, MD  cloNIDine (CATAPRES) 0.2 MG tablet Take 1 tablet (0.2 mg total) by mouth 2 (two) times daily. 12/28/14   Antonieta Ibaimothy J Gollan, MD  clopidogrel (PLAVIX) 75 MG tablet Take 1 tablet (75 mg total) by mouth daily. 12/28/14   Antonieta Ibaimothy J Gollan, MD  HYDROcodone-acetaminophen (NORCO/VICODIN) 5-325 MG tablet Take 1 tablet by mouth every 4 (four) hours as needed. 07/01/15   Chinita Pesterari B Triplett, FNP  insulin aspart (NOVOLOG) 100 UNIT/ML injection Inject 10 Units into the skin 2 (two) times daily with a meal. 11/10/14   Jimmy FootmanAndrea Hernandez-Gonzalez, MD  lisinopril (PRINIVIL,ZESTRIL) 40 MG tablet Take 1 tablet (40 mg total) by mouth daily. 12/28/14   Antonieta Ibaimothy J Gollan, MD  meloxicam (MOBIC) 7.5 MG tablet Take 1 tablet (7.5 mg total) by mouth daily. 04/16/15 04/15/16  Sharman CheekPhillip Stafford, MD  metFORMIN (GLUCOPHAGE) 500 MG tablet Take 1 tablet (500 mg total) by mouth 2 (two) times daily before a meal. 11/10/14   Jimmy FootmanAndrea Hernandez-Gonzalez, MD  methocarbamol (ROBAXIN-750) 750 MG tablet Take 2 tablets (1,500 mg total) by mouth 4 (four) times daily. 04/16/15   Sharman CheekPhillip Stafford, MD  naproxen (NAPROSYN) 500 MG tablet Take 1 tablet (500 mg total) by mouth 2 (two) times daily with a meal. 04/17/15   Jene Everyobert Kinner, MD  nitroGLYCERIN (NITROSTAT) 0.4 MG SL tablet Place 1 tablet (0.4 mg total) under the tongue every 5 (five) minutes as needed for chest pain. 03/10/15   Jimmy FootmanAndrea Hernandez-Gonzalez, MD  pantoprazole (PROTONIX) 40 MG tablet Take 1 tablet (40 mg total) by mouth daily. 11/10/14   Jimmy FootmanAndrea Hernandez-Gonzalez, MD  sertraline (ZOLOFT) 100 MG tablet Take 0.5 tablets  (50 mg total) by mouth daily. 11/10/14   Jimmy FootmanAndrea Hernandez-Gonzalez, MD  tadalafil (CIALIS) 20 MG tablet Take 1 tablet (20 mg total) by mouth daily as needed for erectile dysfunction. 12/28/14   Antonieta Ibaimothy J Gollan, MD  traZODone (DESYREL) 150 MG tablet Take 1 tablet (150 mg total) by mouth at bedtime. 11/10/14   Jimmy FootmanAndrea Hernandez-Gonzalez, MD    Allergies Review of patient's allergies indicates no known allergies.  Family History  Problem Relation Age of Onset  . Stroke Mother   . Diabetes type II Mother   . Hypertension Mother   . Hypertension Father   . Arthritis Father   . Arthritis Sister   . Diabetes type II Sister   . Hypertension Sister     Social History Social History  Substance Use Topics  . Smoking status: Former Smoker    Packs/day: 0.50    Years: 40.00    Types: Cigars  . Smokeless tobacco: Never Used  . Alcohol use 25.8 oz/week    3 Glasses of wine, 40 Cans of beer  per week     Comment: occasionally; reports 1 pint of vodka per month at paydaysocially    Review of Systems Constitutional: No fever/chills Eyes: No visual changes. ENT: No sore throat. Cardiovascular: chest pain. Respiratory:  shortness of breath. Gastrointestinal: No abdominal pain.  No nausea, no vomiting.  No diarrhea.  No constipation. Genitourinary: Negative for dysuria. Musculoskeletal: Negative for back pain. Skin: Negative for rash. Neurological: Headache  10-point ROS otherwise negative.  ____________________________________________   PHYSICAL EXAM:  VITAL SIGNS: ED Triage Vitals  Enc Vitals Group     BP 11/27/15 0622 (!) 146/102     Pulse Rate 11/27/15 0511 73     Resp 11/27/15 0511 (!) 22     Temp 11/27/15 0511 98.5 F (36.9 C)     Temp Source 11/27/15 0511 Oral     SpO2 11/27/15 0511 99 %     Weight 11/27/15 0514 247 lb 8 oz (112.3 kg)     Height 11/27/15 0514 5\' 11"  (1.803 m)     Head Circumference --      Peak Flow --      Pain Score --      Pain Loc --      Pain  Edu? --      Excl. in GC? --     Constitutional: Alert and oriented. Well appearing and in Moderate distress. Eyes: Conjunctivae are normal. PERRL. EOMI. Head: Atraumatic. Nose: No congestion/rhinnorhea. Mouth/Throat: Mucous membranes are moist.  Oropharynx non-erythematous. Cardiovascular: Normal rate, regular rhythm. Grossly normal heart sounds.  Good peripheral circulation. Respiratory: Normal respiratory effort.  No retractions. Lungs CTAB. Gastrointestinal: Soft and nontender. No distention. Positive bowel sounds Musculoskeletal: No lower extremity tenderness nor edema.   Neurologic:  Normal speech and language. Cranial nerves II through XII are grossly intact with no focal motor or neuro deficits Skin:  Skin is warm, dry and intact.  Psychiatric: Mood and affect are normal. .  ____________________________________________   LABS (all labs ordered are listed, but only abnormal results are displayed)  Labs Reviewed  CBC - Abnormal; Notable for the following:       Result Value   WBC 10.8 (*)    Hemoglobin 18.4 (*)    All other components within normal limits  COMPREHENSIVE METABOLIC PANEL - Abnormal; Notable for the following:    Sodium 132 (*)    Chloride 96 (*)    Glucose, Bld 350 (*)    Creatinine, Ser 1.36 (*)    Total Protein 8.5 (*)    GFR calc non Af Amer 57 (*)    All other components within normal limits  TROPONIN I - Abnormal; Notable for the following:    Troponin I 0.04 (*)    All other components within normal limits  SEDIMENTATION RATE  URINE DRUG SCREEN, QUALITATIVE ([facility] ONLY)  C-REACTIVE PROTEIN  TROPONIN I   ____________________________________________  EKG  ED ECG REPORT I, Rebecka Apley, the attending physician, personally viewed and interpreted this ECG.   Date: 11/27/2015  EKG Time: 510  Rate: 71  Rhythm: normal sinus rhythm  Axis: left axis deviation  Intervals:none  ST&T Change: flipped t waves in leads I, II, V4, V5,  V6  ____________________________________________  RADIOLOGY  CXR CT head CT angio head ____________________________________________   PROCEDURES  Procedure(s) performed: None  Procedures  Critical Care performed: No  ____________________________________________   INITIAL IMPRESSION / ASSESSMENT AND PLAN / ED COURSE  Pertinent labs & imaging results that were available during my  care of the patient were reviewed by me and considered in my medical decision making (see chart for details).  This is a 55 year old male who has a history of hypertension and comes into the hospital today with some headache and chest pain. The patient's blood pressure is significantly elevated. We'll give the patient some Reglan and Benadryl. I'll send him for a CT of his head as well as a chest x-ray to ensure that he does not have any intracranial hemorrhage due to this pain. I will then reassess the patient.  The patient's CT of his head was unremarkable. He also had a clean urine drug screen given his history of cocaine use. I will give the patient some hydralazine for his blood pressure and then reassess the patient.  Clinical Course  Value Comment By Time  CT Head Wo Contrast No acute intracranial process.  Stable chronic changes including old RIGHT frontal lobe and LEFT cerebellar/PICA territory infarcts.   Rebecka Apley, MD 10/23 [phone]  DG Chest 2 View No active cardiopulmonary disease. Rebecka Apley, MD 10/23 [phone]  CT Angio Chest/Abd/Pel for Dissection W and/or Wo Contrast No evidence of aortic dissection or aneurysmal dilatation.  5 mm nodule in the right upper lobe as described. No follow-up needed if patient is low-risk. Non-contrast chest CT can be considered in 12 months if patient is high-risk   Rebecka Apley, MD 10/23 0730   The patient's chest x-ray, CT head as well as CT angios is all unremarkable. The patient does have some stable chronic infarcts and no  evidence of dissection or evaluation. The patient will need a repeat of his troponin and he will be reassessed. The patient's care was signed out to Dr.Paduchowski will follow-up the results of the patient's troponin.  ____________________________________________   FINAL CLINICAL IMPRESSION(S) / ED DIAGNOSES  Final diagnoses:  Chest pain, unspecified type  Acute nonintractable headache, unspecified headache type  Hypertensive urgency      NEW MEDICATIONS STARTED DURING THIS VISIT:  New Prescriptions   No medications on file     Note:  This document was prepared using Dragon voice recognition software and may include unintentional dictation errors.    Rebecka Apley, MD 11/27/15 [phone]

## 2015-11-27 NOTE — ED Triage Notes (Signed)
Pt c/o pain to the center of his chest since last evening; skin clammy;  headache tonight kept him from sleeping; arrival BP 181/127; pt says he has history of HTN and took his medication before coming to the ED this am;

## 2015-11-27 NOTE — ED Notes (Signed)
Patient reports feeling anxious at this time.

## 2015-11-27 NOTE — Discharge Instructions (Signed)
Please take all your blood pressure medications as prescribed every day. Please follow-up with your primary care doctor in the next 1-2 days for recheck/reevaluation. Return to the emergency department for any further chest pain, worsening headache, or any other symptom personally concerning to your self.

## 2015-11-27 NOTE — ED Notes (Signed)
Pt denies chest pain at this time.

## 2015-11-27 NOTE — ED Notes (Signed)
MD at bedside. 

## 2015-12-08 ENCOUNTER — Encounter: Payer: Self-pay | Admitting: *Deleted

## 2015-12-08 ENCOUNTER — Ambulatory Visit: Payer: Medicaid Other | Admitting: Cardiovascular Disease

## 2015-12-13 ENCOUNTER — Telehealth: Payer: Self-pay | Admitting: Cardiovascular Disease

## 2015-12-13 NOTE — Telephone Encounter (Signed)
3 attempts to schedule fu from recall lost. Deleting recall.  °

## 2016-02-01 ENCOUNTER — Emergency Department
Admission: EM | Admit: 2016-02-01 | Discharge: 2016-02-01 | Disposition: A | Discharge status: Home / Self Care | Payer: Medicaid Other | Attending: Emergency Medicine | Admitting: Emergency Medicine

## 2016-02-01 ENCOUNTER — Encounter: Payer: Self-pay | Admitting: Emergency Medicine

## 2016-02-01 DIAGNOSIS — I2510 Atherosclerotic heart disease of native coronary artery without angina pectoris: Secondary | ICD-10-CM | POA: Diagnosis not present

## 2016-02-01 DIAGNOSIS — E119 Type 2 diabetes mellitus without complications: Secondary | ICD-10-CM | POA: Diagnosis not present

## 2016-02-01 DIAGNOSIS — I10 Essential (primary) hypertension: Secondary | ICD-10-CM | POA: Insufficient documentation

## 2016-02-01 DIAGNOSIS — Z794 Long term (current) use of insulin: Secondary | ICD-10-CM | POA: Insufficient documentation

## 2016-02-01 DIAGNOSIS — Z87891 Personal history of nicotine dependence: Secondary | ICD-10-CM | POA: Insufficient documentation

## 2016-02-01 DIAGNOSIS — Z7982 Long term (current) use of aspirin: Secondary | ICD-10-CM | POA: Insufficient documentation

## 2016-02-01 DIAGNOSIS — Z791 Long term (current) use of non-steroidal anti-inflammatories (NSAID): Secondary | ICD-10-CM | POA: Diagnosis not present

## 2016-02-01 DIAGNOSIS — R040 Epistaxis: Secondary | ICD-10-CM

## 2016-02-01 DIAGNOSIS — Z79899 Other long term (current) drug therapy: Secondary | ICD-10-CM | POA: Diagnosis not present

## 2016-02-01 NOTE — Discharge Instructions (Signed)
Please seek medical attention for any high fevers, chest pain, shortness of breath, change in behavior, persistent vomiting, bloody stool or any other new or concerning symptoms.  

## 2016-02-01 NOTE — ED Provider Notes (Signed)
[facility] Emergency Department Provider Note   ____________________________________________   I have reviewed the triage vital signs and the nursing notes.   HISTORY  Chief Complaint Epistaxis   History limited by: Not Limited   HPI [name] Michaell Cowingride Jr. is a 55 y.o. male who presents to the emergency department today because of concerns for nosebleed. Patient states it started this morning. He was bleeding outside of the right side of his nose. This been on and off throughout the day. The patient denies any trauma to his nose. He denies picking his nose. States he did have a history of nosebleeds when he was a much younger child although has not had one in a number of years. Not complaining of any current leads. No shortness of breath. No dizziness.  EMS reported the patient was on a blood thinner however review of the patient's medications which he had on his person did not reveal any blood thinners, although documented that he has been on plavix.   Past Medical History:  Diagnosis Date  . Aortic stenosis, mild February 2015  . Auditory hallucinations   . Bipolar disorder (HCC)   . Coronary artery disease    No reported catheterization. Negative Myoview stress test and 2013.  Marland Kitchen. Depression   . Diabetes mellitus   . GERD (gastroesophageal reflux disease)   . History of suicide attempt   . Hx of medication noncompliance   . Hypertension   . Hypertensive hypertrophic cardiomyopathy [facility](HCC) February 2015   Severe concentric LVH on echocardiogram.  . Obesity, Class II, BMI 35-39.9, with comorbidity   . Pericarditis   . Polysubstance abuse    cocaine, marijuana  . Stroke [facility](HCC) February 2015   Right MCA aneurysm; head MRI - acute infarct of the right frontal lobe. Smaller acute infarct of the left frontal lobe. Thought to be due to cerebral emboli. Left PICA infarct. Small chronic lacunar infarct right internal capsule. Chronic microvascular ischemic change with  white matter change.  . Syncope and collapse     Patient Active Problem List   Diagnosis Date Noted  . 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 Major depressive disorder, recurrent severe without psychotic features (HCC) 11/07/2014  . Tobacco use disorder 11/07/2014  . Stimulant use disorder (HCC) crack cocaine 11/07/2014  . Alcohol use disorder, moderate, dependence (HCC) 11/07/2014  . CAD (coronary artery disease) 11/05/2014  . CVA (cerebral infarction) 10/19/2014  . LVH (left ventricular hypertrophy) 05/20/2013  . Obesity (BMI 30-39.9) 04/10/2013  . Aortic stenosis, mild 03/07/2013  . DM (diabetes mellitus), type 2, uncontrolled (HCC) 01/05/2013  . Essential hypertension, malignant 01/25/2009    Past Surgical History:  Procedure Laterality Date  . Carotid Dopplers:  03/23/2013   Interval thickening of bilateral common carotid no significant plaque formation. Tortuosity but no significant stenosis. Patent bilateral vertebral  arteries  . NM MYOVIEW LTD  July 2013   No ischemia or infarct. There is inferior wall defec suggestive of diaphragmatic attenuation. Mild LV dilation at to be due to hypertensive heart disease. EF 55%. Poor exercise tolerance.  Marland Kitchen. PERICARDIAL FLUID DRAINAGE    . TRANSTHORACIC ECHOCARDIOGRAM  03/23/2013   EF 5560%. Elevated left atrial pressure. Severe concentric LVH. Mild aortic stenosis. -- Appearance of hypertensive hypertrophic heart disease.  No clear cardioembolic source    Prior to Admission medications   Medication Sig Start Date End Date Taking? Authorizing Provider  amLODipine (NORVASC) 10 MG tablet Take 1 tablet (10 mg  total) by mouth daily. 12/28/14   Antonieta Ibaimothy J Gollan, MD  aspirin EC 81 MG EC tablet Take 1 tablet (81 mg total) by mouth daily. 11/10/14   Jimmy FootmanAndrea Hernandez-Gonzalez, MD  atorvastatin (LIPITOR) 20 MG tablet Take 1 tablet (20 mg total) by mouth daily at 6 PM. 12/28/14   Antonieta Ibaimothy J Gollan, MD   chlorthalidone (HYGROTON) 25 MG tablet Take 1 tablet (25 mg total) by mouth daily. 11/10/14   Jimmy FootmanAndrea Hernandez-Gonzalez, MD  cloNIDine (CATAPRES) 0.2 MG tablet Take 1 tablet (0.2 mg total) by mouth 2 (two) times daily. 12/28/14   Antonieta Ibaimothy J Gollan, MD  clopidogrel (PLAVIX) 75 MG tablet Take 1 tablet (75 mg total) by mouth daily. 12/28/14   Antonieta Ibaimothy J Gollan, MD  HYDROcodone-acetaminophen (NORCO/VICODIN) 5-325 MG tablet Take 1 tablet by mouth every 4 (four) hours as needed. 07/01/15   Chinita Pesterari B Triplett, FNP  insulin aspart (NOVOLOG) 100 UNIT/ML injection Inject 10 Units into the skin 2 (two) times daily with a meal. 11/10/14   Jimmy FootmanAndrea Hernandez-Gonzalez, MD  lisinopril (PRINIVIL,ZESTRIL) 40 MG tablet Take 1 tablet (40 mg total) by mouth daily. 12/28/14   Antonieta Ibaimothy J Gollan, MD  meloxicam (MOBIC) 7.5 MG tablet Take 1 tablet (7.5 mg total) by mouth daily. 04/16/15 04/15/16  Sharman CheekPhillip Stafford, MD  metFORMIN (GLUCOPHAGE) 500 MG tablet Take 1 tablet (500 mg total) by mouth 2 (two) times daily before a meal. 11/10/14   Jimmy FootmanAndrea Hernandez-Gonzalez, MD  methocarbamol (ROBAXIN-750) 750 MG tablet Take 2 tablets (1,500 mg total) by mouth 4 (four) times daily. 04/16/15   Sharman CheekPhillip Stafford, MD  naproxen (NAPROSYN) 500 MG tablet Take 1 tablet (500 mg total) by mouth 2 (two) times daily with a meal. 04/17/15   Jene Everyobert Kinner, MD  nitroGLYCERIN (NITROSTAT) 0.4 MG SL tablet Place 1 tablet (0.4 mg total) under the tongue every 5 (five) minutes as needed for chest pain. 03/10/15   Jimmy FootmanAndrea Hernandez-Gonzalez, MD  pantoprazole (PROTONIX) 40 MG tablet Take 1 tablet (40 mg total) by mouth daily. 11/10/14   Jimmy FootmanAndrea Hernandez-Gonzalez, MD  sertraline (ZOLOFT) 100 MG tablet Take 0.5 tablets (50 mg total) by mouth daily. 11/10/14   Jimmy FootmanAndrea Hernandez-Gonzalez, MD  tadalafil (CIALIS) 20 MG tablet Take 1 tablet (20 mg total) by mouth daily as needed for erectile dysfunction. 12/28/14   Antonieta Ibaimothy J Gollan, MD  traZODone (DESYREL) 150 MG tablet Take 1 tablet  (150 mg total) by mouth at bedtime. 11/10/14   Jimmy FootmanAndrea Hernandez-Gonzalez, MD    Allergies Patient has no known allergies.  Family History  Problem Relation Age of Onset  . Stroke Mother   . Diabetes type II Mother   . Hypertension Mother   . Hypertension Father   . Arthritis Father   . Arthritis Sister   . Diabetes type II Sister   . Hypertension Sister     Social History Social History  Substance Use Topics  . Smoking status: Former Smoker    Packs/day: 0.50    Years: 40.00    Types: Cigars  . Smokeless tobacco: Never Used  . Alcohol use No     Comment: occasionally; reports 1 pint of vodka per month at paydaysocially    Review of Systems  Constitutional: Negative for fever. Cardiovascular: Negative for chest pain. Respiratory: Negative for shortness of breath. Gastrointestinal: Negative for abdominal pain, vomiting and diarrhea. Neurological: Negative for headaches, focal weakness or numbness.  10-point ROS otherwise negative.  ____________________________________________   PHYSICAL EXAM:  VITAL SIGNS: ED Triage Vitals [02/01/16 1210]  Enc  Vitals Group     BP (!) 138/97     Pulse Rate 74     Resp 19     Temp 97.6 F (36.4 C)     Temp Source Oral     SpO2 97 %     Weight 240 lb (108.9 kg)     Height 5\' 11"  (1.803 m)   Constitutional: Alert and oriented. Well appearing and in no distress. Eyes: Conjunctivae are normal. Normal extraocular movements. ENT   Head: Normocephalic and atraumatic.   Nose: Some blood noted in bilateral nares - no active bleeding appreciated.   Mouth/Throat: Mucous membranes are moist.   Neck: No stridor. Hematological/Lymphatic/Immunilogical: No cervical lymphadenopathy. Cardiovascular: Normal rate, regular rhythm.  No murmurs, rubs, or gallops. Respiratory: Normal respiratory effort without tachypnea nor retractions. Breath sounds are clear and equal bilaterally. No wheezes/rales/rhonchi. Gastrointestinal: Soft  and non tender. No rebound. No guarding.  Genitourinary: Deferred Musculoskeletal: Normal range of motion in all extremities. No lower extremity edema. Neurologic:  Normal speech and language. No gross focal neurologic deficits are appreciated.  Skin:  Skin is warm, dry and intact. No rash noted. Psychiatric: Mood and affect are normal. Speech and behavior are normal. Patient exhibits appropriate insight and judgment.  ____________________________________________    LABS (pertinent positives/negatives)  None  ____________________________________________   EKG  None  ____________________________________________    RADIOLOGY  None  ____________________________________________   PROCEDURES  Procedures  ____________________________________________   INITIAL IMPRESSION / ASSESSMENT AND PLAN / ED COURSE  Pertinent labs & imaging results that were available during my care of the patient were reviewed by me and considered in my medical decision making (see chart for details).  Patient presented to the emergency department today because of concerns for epistaxis. Patient was not bleeding at the time of my examination. Will plan on giving patient some Afrin here to help avoid further bleeds. Will plan on discharging home. Did discuss some nosebleed care with the patient.  ____________________________________________   FINAL CLINICAL IMPRESSION(S) / ED DIAGNOSES  Final diagnoses:  Epistaxis     Note: This dictation was prepared with Dragon dictation. Any transcriptional errors that result from this process are unintentional     Phineas Semen, MD 02/01/16 1235

## 2016-02-01 NOTE — ED Triage Notes (Signed)
Patient comes in via [facility] from home with a nose bleed that started about 1am. Per EMS patient had tissue up his nose that showed a clot. Patient is currently not bleeding. Vitals en route 148/95 hr 74, 95% ra. CBG 258. Is on blood thinner not sure which one.

## 2016-03-10 ENCOUNTER — Emergency Department: Payer: Medicaid Other

## 2016-03-10 ENCOUNTER — Encounter: Payer: Self-pay | Admitting: Emergency Medicine

## 2016-03-10 ENCOUNTER — Emergency Department
Admission: EM | Admit: 2016-03-10 | Discharge: 2016-03-11 | Disposition: A | Discharge status: Other Acute Inpatient Hospital | Payer: Medicaid Other | Source: Home / Self Care | Attending: Emergency Medicine | Admitting: Emergency Medicine

## 2016-03-10 DIAGNOSIS — R45851 Suicidal ideations: Secondary | ICD-10-CM

## 2016-03-10 DIAGNOSIS — R202 Paresthesia of skin: Secondary | ICD-10-CM

## 2016-03-10 DIAGNOSIS — F1410 Cocaine abuse, uncomplicated: Secondary | ICD-10-CM

## 2016-03-10 DIAGNOSIS — R079 Chest pain, unspecified: Secondary | ICD-10-CM

## 2016-03-10 LAB — CBC WITH DIFFERENTIAL/PLATELET
BASOS ABS: 0.1 10*3/uL (ref 0–0.1)
Basophils Relative: 1 %
EOS ABS: 0.3 10*3/uL (ref 0–0.7)
EOS PCT: 4 %
HCT: 45.4 % (ref 40.0–52.0)
Hemoglobin: 16 g/dL (ref 13.0–18.0)
LYMPHS ABS: 2.1 10*3/uL (ref 1.0–3.6)
Lymphocytes Relative: 24 %
MCH: 31.7 pg (ref 26.0–34.0)
MCHC: 35.2 g/dL (ref 32.0–36.0)
MCV: 90.1 fL (ref 80.0–100.0)
MONO ABS: 1 10*3/uL (ref 0.2–1.0)
Monocytes Relative: 11 %
Neutro Abs: 5.5 10*3/uL (ref 1.4–6.5)
Neutrophils Relative %: 60 %
PLATELETS: 222 10*3/uL (ref 150–440)
RBC: 5.04 MIL/uL (ref 4.40–5.90)
RDW: 12.9 % (ref 11.5–14.5)
WBC: 9 10*3/uL (ref 3.8–10.6)

## 2016-03-10 LAB — COMPREHENSIVE METABOLIC PANEL
ALBUMIN: 4.1 g/dL (ref 3.5–5.0)
ALT: 15 U/L — AB (ref 17–63)
AST: 20 U/L (ref 15–41)
Alkaline Phosphatase: 62 U/L (ref 38–126)
Anion gap: 9 (ref 5–15)
BUN: 18 mg/dL (ref 6–20)
CHLORIDE: 106 mmol/L (ref 101–111)
CO2: 22 mmol/L (ref 22–32)
CREATININE: 1.05 mg/dL (ref 0.61–1.24)
Calcium: 8.7 mg/dL — ABNORMAL LOW (ref 8.9–10.3)
GFR calc Af Amer: >60 mL/min (ref >60)
GFR calc non Af Amer: >60 mL/min (ref >60)
GLUCOSE: 199 mg/dL — AB (ref 65–99)
Potassium: 3.6 mmol/L (ref 3.5–5.1)
SODIUM: 137 mmol/L (ref 135–145)
Total Bilirubin: 1.2 mg/dL (ref 0.3–1.2)
Total Protein: 7.2 g/dL (ref 6.5–8.1)

## 2016-03-10 LAB — URINE DRUG SCREEN, QUALITATIVE (ARMC ONLY)
Amphetamines, Ur Screen: NOT DETECTED
BARBITURATES, UR SCREEN: NOT DETECTED
Benzodiazepine, Ur Scrn: NOT DETECTED
CANNABINOID 50 NG, UR ~~LOC~~: POSITIVE — AB
COCAINE METABOLITE, UR ~~LOC~~: POSITIVE — AB
MDMA (ECSTASY) UR SCREEN: NOT DETECTED
Methadone Scn, Ur: NOT DETECTED
Opiate, Ur Screen: POSITIVE — AB
PHENCYCLIDINE (PCP) UR S: NOT DETECTED
Tricyclic, Ur Screen: NOT DETECTED

## 2016-03-10 LAB — URINALYSIS, COMPLETE (UACMP) WITH MICROSCOPIC
BILIRUBIN URINE: NEGATIVE
Bacteria, UA: NONE SEEN
GLUCOSE, UA: 150 mg/dL — AB
HGB URINE DIPSTICK: NEGATIVE
Ketones, ur: NEGATIVE mg/dL
Leukocytes, UA: NEGATIVE
Nitrite: NEGATIVE
PROTEIN: NEGATIVE mg/dL
Specific Gravity, Urine: 1.023 (ref 1.005–1.030)
pH: 5 (ref 5.0–8.0)

## 2016-03-10 LAB — LIPASE, BLOOD: Lipase: 19 U/L (ref 11–51)

## 2016-03-10 LAB — SALICYLATE LEVEL: Salicylate Lvl: <7 mg/dL (ref 2.8–30.0)

## 2016-03-10 LAB — TROPONIN I
TROPONIN I: 0.04 ng/mL — AB (ref <0.03)
Troponin I: 0.03 ng/mL (ref <0.03)

## 2016-03-10 LAB — ACETAMINOPHEN LEVEL: Acetaminophen (Tylenol), Serum: <10 ug/mL — ABNORMAL LOW (ref 10–30)

## 2016-03-10 MED ORDER — CHLORTHALIDONE 25 MG PO TABS
25.0000 mg | ORAL_TABLET | Freq: Every day | ORAL | Status: DC
  Administered 2016-03-10 – 2016-03-11 (×2): 25 mg via ORAL
  Filled 2016-03-10 (×3): qty 1

## 2016-03-10 MED ORDER — TRAZODONE HCL 50 MG PO TABS
ORAL_TABLET | ORAL | Status: AC
  Filled 2016-03-10: qty 1

## 2016-03-10 MED ORDER — IBUPROFEN 600 MG PO TABS
600.0000 mg | ORAL_TABLET | Freq: Four times a day (QID) | ORAL | Status: DC | PRN
  Administered 2016-03-10 – 2016-03-11 (×2): 600 mg via ORAL
  Filled 2016-03-10 (×2): qty 1

## 2016-03-10 MED ORDER — ONDANSETRON HCL 4 MG/2ML IJ SOLN
4.0000 mg | Freq: Once | INTRAMUSCULAR | Status: AC
  Administered 2016-03-10: 4 mg via INTRAVENOUS
  Filled 2016-03-10: qty 2

## 2016-03-10 MED ORDER — TRAZODONE HCL 50 MG PO TABS
150.0000 mg | ORAL_TABLET | Freq: Every day | ORAL | Status: DC
  Administered 2016-03-10: 150 mg via ORAL

## 2016-03-10 MED ORDER — METFORMIN HCL 500 MG PO TABS
500.0000 mg | ORAL_TABLET | Freq: Two times a day (BID) | ORAL | Status: DC
  Administered 2016-03-11: 500 mg via ORAL
  Filled 2016-03-10: qty 1

## 2016-03-10 MED ORDER — ASPIRIN EC 81 MG PO TBEC
81.0000 mg | DELAYED_RELEASE_TABLET | Freq: Every day | ORAL | Status: DC
  Administered 2016-03-11: 81 mg via ORAL
  Filled 2016-03-10: qty 1

## 2016-03-10 MED ORDER — PANTOPRAZOLE SODIUM 40 MG PO TBEC
40.0000 mg | DELAYED_RELEASE_TABLET | Freq: Every day | ORAL | Status: DC
  Administered 2016-03-11: 40 mg via ORAL
  Filled 2016-03-10: qty 1

## 2016-03-10 MED ORDER — ATORVASTATIN CALCIUM 20 MG PO TABS: 20.0000 mg | ORAL_TABLET | Freq: Every day | ORAL | Status: DC

## 2016-03-10 MED ORDER — AMLODIPINE BESYLATE 5 MG PO TABS
10.0000 mg | ORAL_TABLET | Freq: Every day | ORAL | Status: DC
  Administered 2016-03-11: 10 mg via ORAL
  Filled 2016-03-10: qty 2

## 2016-03-10 MED ORDER — CHLORTHALIDONE 25 MG PO TABS: 25.0000 mg | ORAL_TABLET | Freq: Every day | ORAL | Status: DC

## 2016-03-10 MED ORDER — TRAZODONE HCL 100 MG PO TABS
ORAL_TABLET | ORAL | Status: AC
  Filled 2016-03-10: qty 1

## 2016-03-10 MED ORDER — CLONIDINE HCL 0.1 MG PO TABS
ORAL_TABLET | ORAL | Status: AC
  Administered 2016-03-10: 0.2 mg via ORAL
  Filled 2016-03-10: qty 2

## 2016-03-10 MED ORDER — CLOPIDOGREL BISULFATE 75 MG PO TABS
75.0000 mg | ORAL_TABLET | Freq: Every day | ORAL | Status: DC
  Filled 2016-03-10: qty 1

## 2016-03-10 MED ORDER — CLONIDINE HCL 0.1 MG PO TABS
0.2000 mg | ORAL_TABLET | Freq: Two times a day (BID) | ORAL | Status: DC
  Administered 2016-03-10 – 2016-03-11 (×2): 0.2 mg via ORAL
  Filled 2016-03-10: qty 2

## 2016-03-10 MED ORDER — AMLODIPINE BESYLATE 5 MG PO TABS
10.0000 mg | ORAL_TABLET | Freq: Once | ORAL | Status: AC
  Administered 2016-03-10: 10 mg via ORAL

## 2016-03-10 MED ORDER — AMLODIPINE BESYLATE 5 MG PO TABS
ORAL_TABLET | ORAL | Status: AC
  Administered 2016-03-10: 10 mg via ORAL
  Filled 2016-03-10: qty 2

## 2016-03-10 MED ORDER — MORPHINE SULFATE (PF) 4 MG/ML IV SOLN
4.0000 mg | Freq: Once | INTRAVENOUS | Status: AC
Start: 2016-03-10 — End: 2016-03-10
  Administered 2016-03-10: 4 mg via INTRAVENOUS
  Filled 2016-03-10: qty 1

## 2016-03-10 NOTE — ED Triage Notes (Signed)
[facility], Patient states that he has had chest pain since midnight, chest pain radiates into his left shoulder. Patient denies cardiac history. Patient states that he went to [employer] and the pain increased. Patient was given 324 mg of Aspirin by EMS and 2 nitro that did not relieve the pain. Patient states that he had similar symptoms when he had a stroke, patient states that he has also had left sided facial numbness since midnight, patient also states that he had trouble speaking and slurred speech at the time.

## 2016-03-10 NOTE — ED Notes (Addendum)
In room watching Tv on approach. Appears flat and depressed, but did brighten mildly in conversation. Calm and cooperative with assessment. States he continues to feel as depressed and anxious as he was when he came. However, he feels like he feels safe here and denies SI/HI/AVH and verbally contracts for safety. States he is aware of the INPT disposition recommendation and states he is fine with having to go there. Support and encouragement provided. Did c/o left shoulder pain. Will call EDP for recommendation. Otherwise offered no questions or concerns. Safety has been maintained with q15 min obs and ODS obs. Will continue current POC.

## 2016-03-10 NOTE — ED Notes (Signed)
Attempting to call EDP to request prn for pain. Pt is currently resting quietly in bed with eyes closed. Respirations even and unlabored. Does not appear to be in acute distress. Will continue to monitor

## 2016-03-10 NOTE — ED Provider Notes (Addendum)
-----------------------------------------   11:44 PM on 03/10/2016 -----------------------------------------  I was told that the behavioral unit could take the patient down stairs but will not do so because the diastolic blood pressure is consistently elevated at about 100.  This has been consistent for the last 13 hours he has been in the emergency department and he is asymptomatic from it.  I have reordered the patient's medications that were not previously ordered including the clonidine which we will give him a dose of at this time.  The patient reports he has not been taking his lisinopril so I will not restart the lisinopril.  It is important to note that the fact that behavioral unit will not take the patient down stairs with asymptomatic hypertension is delaying his psychiatric care and he is not in any acute danger as a result of his chronic hypertension.  This situation will need to be reevaluated in the morning after his Norvasc and clonidine.    ----------------------------------------- 5:46 AM on 03/11/2016 -----------------------------------------  BP still elevated.  Ordering lisinopril 40 mg daily including once now; he was on this previously and states that he is not taking it, but it may help his BP control.   Loleta Roseory Kanav Kazmierczak, MD 03/11/16 [phone]

## 2016-03-10 NOTE — ED Notes (Addendum)
Attempt to call report to BMU. RN voiced concern about BP because cardiac HX. Going to hold patient and speak with EDP on restarting BP medications and CBG's.

## 2016-03-10 NOTE — ED Notes (Signed)
D/w Dr. Pershing ProudSchaevitz about pt's suicidal ideation without a plan.  Pt states he would rather be dead than to have to go through medical problems. Pt does have a pocket knife on him which has been given to security. Pt again denies a plan at this time. Per Dr. Pershing ProudSchaevitz he does not need a sitter at this time.

## 2016-03-10 NOTE — BH Assessment (Signed)
Tele Assessment Note   [name] [name]. is an 56 y.o. male presenting to [facility] with chest pain. The patient reports to ED   Pt states, "he would rather be dead than to have to go through medical problems." Patient reports SI thoughts with plan to walk into traffic or shoot himself. Last thought was yesterday. Patient expressed a history of SI attempt in the 80's by ingesting rat poison.   Denies Hi.  Sometimes hears nondescript voices and sees a figure moving. Patient has not been on his psychiatric medications for more than 6 months. Reports a recent break up with his girlfriend, describes feeling paranoid she was with someone else and this caused him a lot of anxiety.   Patient lived for 10 yrs with his father and before that live with his mother before she passed away. Father lives in a nursing home now forcing the patient to live on his own producing instability and lack of selfcare. Patient currently lives in a boarding house but states he would like to live at an assisted living.  The patient was pleasant, looked older than stated age, described poor impulse control and judgment. Reports mental health issues on maternal side and substance abuse on fathers side. Patient occasionally goes to [facility] and gets trazodone for sleep but it off all other medications.  Patient reports using crack cocaine once monthly and alcohol once a month. Denies using alcohol to intoxication. This is not consistent with previous admissions where he describes drinking 1/5th of alcohol.   Patient's ER Physician has medically cleared the patient.  SOC recommends inpatient admission  Diagnosis: Major Depressive Disorder, recurrent severe with psychotic features; Alcohol use disorder, Cocaine use disorder  Past Medical History:  Past Medical History:  Diagnosis Date  . Aortic stenosis, mild February 2015  . Auditory hallucinations   . Bipolar disorder (HCC)   . Coronary artery disease    No  reported catheterization. Negative Myoview stress test and 2013.  Marland Kitchen Depression   . Diabetes mellitus   . GERD (gastroesophageal reflux disease)   . History of suicide attempt   . Hx of medication noncompliance   . Hypertension   . Hypertensive hypertrophic cardiomyopathy [facility]) February 2015   Severe concentric LVH on echocardiogram.  . Obesity, Class II, BMI 35-39.9, with comorbidity   . Pericarditis   . Polysubstance abuse    cocaine, marijuana  . Stroke [facility]) February 2015   Right MCA aneurysm; head MRI - acute infarct of the right frontal lobe. Smaller acute infarct of the left frontal lobe. Thought to be due to cerebral emboli. Left PICA infarct. Small chronic lacunar infarct right internal capsule. Chronic microvascular ischemic change with white matter change.  . Syncope and collapse     Past Surgical History:  Procedure Laterality Date  . Carotid Dopplers:  03/23/2013   Interval thickening of bilateral common carotid no significant plaque formation. Tortuosity but no significant stenosis. Patent bilateral vertebral  arteries  . NM MYOVIEW LTD  July 2013   No ischemia or infarct. There is inferior wall defec suggestive of diaphragmatic attenuation. Mild LV dilation at to be due to hypertensive heart disease. EF 55%. Poor exercise tolerance.  Marland Kitchen PERICARDIAL FLUID DRAINAGE    . TRANSTHORACIC ECHOCARDIOGRAM  03/23/2013   EF 5560%. Elevated left atrial pressure. Severe concentric LVH. Mild aortic stenosis. -- Appearance of hypertensive hypertrophic heart disease.  No clear cardioembolic source    Family History:  Family History  Problem Relation Age  of Onset  . Stroke Mother   . Diabetes type II Mother   . Hypertension Mother   . Hypertension Father   . Arthritis Father   . Arthritis Sister   . Diabetes type II Sister   . Hypertension Sister     Social History:  reports that he has quit smoking. His smoking use included Cigars. He has a 20.00 pack-year smoking history. He  has never used smokeless tobacco. He reports that he does not drink alcohol or use drugs.  Additional Social History:  Alcohol / Drug Use Pain Medications: see MAR Prescriptions: see MAR Over the Counter: see MAR History of alcohol / drug use?: Yes Substance #1 Name of Substance 1: crack cocaine 1 - Frequency: once a month  CIWA: CIWA-Ar BP: (!) 155/109 Pulse Rate: 81 COWS:    PATIENT STRENGTHS: (choose at least two) Average or above average intelligence General fund of knowledge  Allergies: No Known Allergies  Home Medications:  (Not in a hospital admission)  OB/GYN Status:  No LMP for male patient.  General Assessment Data [facility] Assessment: In system Is this a Tele or Face-to-Face Assessment?: Face-to-Face Is this an Initial Assessment or a Re-assessment for this encounter?: Initial Assessment Marital status: Single Is patient pregnant?: No Pregnancy Status: No Living Arrangements: Other (Comment) (boarding house) Can pt return to current living arrangement?: Yes Admission Status: Involuntary Is patient capable of signing voluntary admission?: Yes Referral Source: Self/Family/Friend Insurance type: MCD  Medical Screening Exam [facility] Walk-[facility] ONLY) Medical Exam completed: Yes  Crisis Care Plan Living Arrangements: Other (Comment) (boarding house) Name of Psychiatrist: n/a Name of Therapist: n/a  Education Status Is patient currently in school?: No Highest grade of school patient has completed: 9th  Risk to self with the past 6 months Suicidal Ideation: Yes-Currently Present Has patient been a risk to self within the past 6 months prior to admission? : No Suicidal Intent: Yes-Currently Present Has patient had any suicidal intent within the past 6 months prior to admission? : No Is patient at risk for suicide?: Yes Suicidal Plan?: Yes-Currently Present Has patient had any suicidal plan within the past 6 months prior to admission?  : Yes Specify Current Suicidal Plan: shoot self or walk into traffic Access to Means: Yes Specify Access to Suicidal Means: has gun, the ability to walk into traffic What has been your use of drugs/alcohol within the last 12 months?: cocaine Previous Attempts/Gestures: Yes How many times?: 1 Other Self Harm Risks: 0 Triggers for Past Attempts: Other (Comment) (loss) Intentional Self Injurious Behavior: Cutting (20 yrs ago) Family Suicide History: No Recent stressful life event(s): Loss (Comment), Other (Comment) (drug use) Persecutory voices/beliefs?: No Depression: Yes Depression Symptoms: Loss of interest in usual pleasures, Insomnia Substance abuse history and/or treatment for substance abuse?: Yes Suicide prevention information given to non-admitted patients: Not applicable  Risk to Others within the past 6 months Homicidal Ideation: No Does patient have any lifetime risk of violence toward others beyond the six months prior to admission? : No Thoughts of Harm to Others: No Current Homicidal Intent: No Current Homicidal Plan: No Access to Homicidal Means: No History of harm to others?: No Assessment of Violence: None Noted Does patient have access to weapons?: Yes (Comment) Criminal Charges Pending?: No Does patient have a court date: No Is patient on probation?: No  Psychosis Hallucinations: Auditory Delusions: None noted  Mental Status Report Appearance/Hygiene: In scrubs Eye Contact: Good Motor Activity: Unremarkable Speech: Logical/coherent  Level of Consciousness: Alert Mood: Pleasant, Depressed Affect: Appropriate to circumstance Anxiety Level: None Thought Processes: Coherent, Relevant Judgement: Impaired Orientation: Person, Place, Time, Situation Obsessive Compulsive Thoughts/Behaviors: None  Cognitive Functioning Concentration: Normal Memory: Recent Intact, Remote Intact IQ: Average Insight: Poor Impulse Control: Poor Appetite: Poor Sleep:  Decreased  ADLScreening [facility] Assessment Services) Patient's cognitive ability adequate to safely complete daily activities?: Yes Patient able to express need for assistance with ADLs?: Yes Independently performs ADLs?: Yes (appropriate for developmental age)  Prior Inpatient Therapy Prior Inpatient Therapy: Yes Prior Therapy Dates: 2017 Prior Therapy Facilty/Provider(s): [place] Reason for Treatment: SI  Prior Outpatient Therapy Prior Outpatient Therapy: No Does patient have an ACCT team?: No Does patient have Intensive In-House Services?  : No Does patient have [facility] services? : No Does patient have P4CC services?: No  ADL Screening (condition at time of admission) Patient's cognitive ability adequate to safely complete daily activities?: Yes Is the patient deaf or have difficulty hearing?: No Does the patient have difficulty seeing, even when wearing glasses/contacts?: No Does the patient have difficulty concentrating, remembering, or making decisions?: No Patient able to express need for assistance with ADLs?: Yes Does the patient have difficulty dressing or bathing?: No Independently performs ADLs?: Yes (appropriate for developmental age)       Abuse/Neglect Assessment (Assessment to be complete while patient is alone) Physical Abuse: Denies Verbal Abuse: Denies Sexual Abuse: Denies     Merchant navy officer (For Healthcare) Does Patient Have a Medical Advance Directive?: No    Additional Information 1:1 In Past 12 Months?: No CIRT Risk: No Elopement Risk: No Does patient have medical clearance?: Yes     Disposition:  Disposition Initial Assessment Completed for this Encounter: Yes Disposition of Patient: Inpatient treatment program [facility] recommends inpatient treatment) Type of inpatient treatment program: Adult  Westley Hummer 03/10/2016 3:41 PM

## 2016-03-10 NOTE — BH Assessment (Signed)
Patient has been [facility].  Patient assigned to room 306B Admitting physician Dr. Toni Amendlapacs Accepting physician is Dr.Hernandez   Call report to [phone] Representative was Maryelizabeth KaufmannGigi, charge nurse.

## 2016-03-10 NOTE — ED Notes (Signed)

## 2016-03-10 NOTE — ED Notes (Signed)
Patient resting quietly in room. No noted distress or abnormal behaviors noted. Will continue 15 minute checks and observation by security camera for safety. 

## 2016-03-10 NOTE — ED Provider Notes (Signed)
[facility] Emergency Department Provider Note  ____________________________________________   First MD Initiated Contact with Patient 03/10/16 1028     (approximate)  I have reviewed the triage vital signs and the nursing notes.   HISTORY  Chief Complaint Chest Pain   HPI [name] Art Levan. is a 56 y.o. male with a history of bipolar disorder as well as auditory hallucinations as well as stroke and cocaine abuse who is presenting to the emergency department with left-sided chest pain radiating to his left shoulder which started at midnight. He says the pain is an 11 out of 10 and sharp. He says that he also has associated shortness of breath and nausea but denies any vomiting or diaphoresis. He says that he also has left hand as well as facial numbness. He says the last time he had a stroke he had similar symptoms of left-sided chest pain. He does not report any weakness. He says that he is also having difficulty getting his words out and feels that his speech is slurred.  When asked by nursing the patient also reports thoughts of hurting himself. Denies a plan but did have a knife on him.   Past Medical History:  Diagnosis Date  . Aortic stenosis, mild February 2015  . Auditory hallucinations   . Bipolar disorder (HCC)   . Coronary artery disease    No reported catheterization. Negative Myoview stress test and 2013.  Marland Kitchen Depression   . Diabetes mellitus   . GERD (gastroesophageal reflux disease)   . History of suicide attempt   . Hx of medication noncompliance   . Hypertension   . Hypertensive hypertrophic cardiomyopathy [facility]) February 2015   Severe concentric LVH on echocardiogram.  . Obesity, Class II, BMI 35-39.9, with comorbidity   . Pericarditis   . Polysubstance abuse    cocaine, marijuana  . Stroke [facility]) February 2015   Right MCA aneurysm; head MRI - acute infarct of the right frontal lobe. Smaller acute infarct of the left frontal lobe.  Thought to be due to cerebral emboli. Left PICA infarct. Small chronic lacunar infarct right internal capsule. Chronic microvascular ischemic change with white matter change.  . Syncope and collapse     Patient Active Problem List   Diagnosis Date Noted  . 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 Major depressive disorder, recurrent severe without psychotic features (HCC) 11/07/2014  . Tobacco use disorder 11/07/2014  . Stimulant use disorder (HCC) crack cocaine 11/07/2014  . Alcohol use disorder, moderate, dependence (HCC) 11/07/2014  . CAD (coronary artery disease) 11/05/2014  . CVA (cerebral infarction) 10/19/2014  . LVH (left ventricular hypertrophy) 05/20/2013  . Obesity (BMI 30-39.9) 04/10/2013  . Aortic stenosis, mild 03/07/2013  . DM (diabetes mellitus), type 2, uncontrolled (HCC) 01/05/2013  . Essential hypertension, malignant 01/25/2009    Past Surgical History:  Procedure Laterality Date  . Carotid Dopplers:  03/23/2013   Interval thickening of bilateral common carotid no significant plaque formation. Tortuosity but no significant stenosis. Patent bilateral vertebral  arteries  . NM MYOVIEW LTD  July 2013   No ischemia or infarct. There is inferior wall defec suggestive of diaphragmatic attenuation. Mild LV dilation at to be due to hypertensive heart disease. EF 55%. Poor exercise tolerance.  Marland Kitchen PERICARDIAL FLUID DRAINAGE    . TRANSTHORACIC ECHOCARDIOGRAM  03/23/2013   EF 5560%. Elevated left atrial pressure. Severe concentric LVH. Mild aortic stenosis. -- Appearance of hypertensive hypertrophic heart disease.  No clear  cardioembolic source    Prior to Admission medications   Medication Sig Start Date End Date Taking? Authorizing Provider  amLODipine (NORVASC) 10 MG tablet Take 1 tablet (10 mg total) by mouth daily. 12/28/14   Antonieta Ibaimothy J Gollan, MD  aspirin EC 81 MG EC tablet Take 1 tablet (81 mg total) by mouth daily. 11/10/14    Jimmy FootmanAndrea Hernandez-Gonzalez, MD  atorvastatin (LIPITOR) 20 MG tablet Take 1 tablet (20 mg total) by mouth daily at 6 PM. 12/28/14   Antonieta Ibaimothy J Gollan, MD  chlorthalidone (HYGROTON) 25 MG tablet Take 1 tablet (25 mg total) by mouth daily. 11/10/14   Jimmy FootmanAndrea Hernandez-Gonzalez, MD  cloNIDine (CATAPRES) 0.2 MG tablet Take 1 tablet (0.2 mg total) by mouth 2 (two) times daily. 12/28/14   Antonieta Ibaimothy J Gollan, MD  clopidogrel (PLAVIX) 75 MG tablet Take 1 tablet (75 mg total) by mouth daily. 12/28/14   Antonieta Ibaimothy J Gollan, MD  HYDROcodone-acetaminophen (NORCO/VICODIN) 5-325 MG tablet Take 1 tablet by mouth every 4 (four) hours as needed. 07/01/15   Chinita Pesterari B Triplett, FNP  insulin aspart (NOVOLOG) 100 UNIT/ML injection Inject 10 Units into the skin 2 (two) times daily with a meal. 11/10/14   Jimmy FootmanAndrea Hernandez-Gonzalez, MD  lisinopril (PRINIVIL,ZESTRIL) 40 MG tablet Take 1 tablet (40 mg total) by mouth daily. 12/28/14   Antonieta Ibaimothy J Gollan, MD  meloxicam (MOBIC) 7.5 MG tablet Take 1 tablet (7.5 mg total) by mouth daily. 04/16/15 04/15/16  Sharman CheekPhillip Stafford, MD  metFORMIN (GLUCOPHAGE) 500 MG tablet Take 1 tablet (500 mg total) by mouth 2 (two) times daily before a meal. 11/10/14   Jimmy FootmanAndrea Hernandez-Gonzalez, MD  methocarbamol (ROBAXIN-750) 750 MG tablet Take 2 tablets (1,500 mg total) by mouth 4 (four) times daily. 04/16/15   Sharman CheekPhillip Stafford, MD  naproxen (NAPROSYN) 500 MG tablet Take 1 tablet (500 mg total) by mouth 2 (two) times daily with a meal. 04/17/15   Jene Everyobert Kinner, MD  nitroGLYCERIN (NITROSTAT) 0.4 MG SL tablet Place 1 tablet (0.4 mg total) under the tongue every 5 (five) minutes as needed for chest pain. 03/10/15   Jimmy FootmanAndrea Hernandez-Gonzalez, MD  pantoprazole (PROTONIX) 40 MG tablet Take 1 tablet (40 mg total) by mouth daily. 11/10/14   Jimmy FootmanAndrea Hernandez-Gonzalez, MD  sertraline (ZOLOFT) 100 MG tablet Take 0.5 tablets (50 mg total) by mouth daily. 11/10/14   Jimmy FootmanAndrea Hernandez-Gonzalez, MD  tadalafil (CIALIS) 20 MG tablet Take 1  tablet (20 mg total) by mouth daily as needed for erectile dysfunction. 12/28/14   Antonieta Ibaimothy J Gollan, MD  traZODone (DESYREL) 150 MG tablet Take 1 tablet (150 mg total) by mouth at bedtime. 11/10/14   Jimmy FootmanAndrea Hernandez-Gonzalez, MD    Allergies Patient has no known allergies.  Family History  Problem Relation Age of Onset  . Stroke Mother   . Diabetes type II Mother   . Hypertension Mother   . Hypertension Father   . Arthritis Father   . Arthritis Sister   . Diabetes type II Sister   . Hypertension Sister     Social History Social History  Substance Use Topics  . Smoking status: Former Smoker    Packs/day: 0.50    Years: 40.00    Types: Cigars  . Smokeless tobacco: Never Used  . Alcohol use No     Comment: occasionally; reports 1 pint of vodka per month at paydaysocially    Review of Systems Constitutional: No fever/chills Eyes: No visual changes. ENT: No sore throat. Cardiovascular: as above Respiratory:  As above Gastrointestinal: No abdominal pain.  No nausea, no vomiting.   Genitourinary: Negative for dysuria. Musculoskeletal: Negative for back pain. Skin: Negative for rash. Neurological: Negative for headaches, focal weakness or numbness.  10-point ROS otherwise negative.  ____________________________________________   PHYSICAL EXAM:  VITAL SIGNS: ED Triage Vitals  Enc Vitals Group     BP --      Pulse --      Resp --      Temp --      Temp src --      SpO2 --      Weight 03/10/16 1021 223 lb 5.2 oz (101.3 kg)     Height 03/10/16 1021 5\' 11"  (1.803 m)     Head Circumference --      Peak Flow --      Pain Score 03/10/16 1022 10     Pain Loc --      Pain Edu? --      Excl. in GC? --     Constitutional: Alert and oriented. Well appearing and in no acute distress. Eyes: Conjunctivae are normal. PERRL. EOMI. Head: Atraumatic. Nose: No congestion/rhinnorhea. Mouth/Throat: Mucous membranes are moist.  Neck: No stridor.   Cardiovascular: Normal  rate, regular rhythm. Grossly normal heart sounds. Intact and bilaterally equal radial as well as dorsalis pedis pulses.  Respiratory: Normal respiratory effort.  No retractions. Lungs CTAB. Gastrointestinal: Soft and nontender. No distention.  Musculoskeletal: No lower extremity tenderness nor edema.  No joint effusions. Neurologic:  Normal speech and language.  Patient reports subjective sensation loss to the left side of his face. However, otherwise do not see any objective findings of neurologic deficits. Skin:  Skin is warm, dry and intact. No rash noted. Psychiatric: Mood and affect are normal. Speech and behavior are normal.  NIH Stroke Scale    Time: 2:48 PM Person Administering Scale: Arelia Longest  Administer stroke scale items in the order listed. Record performance in each category after each subscale exam. Do not go back and change scores. Follow directions provided for each exam technique. Scores should reflect what the patient does, not what the clinician thinks the patient can do. The clinician should record answers while administering the exam and work quickly. Except where indicated, the patient should not be coached (i.e., repeated requests to patient to make a special effort).   1a  Level of consciousness: 0=alert; keenly responsive  1b. LOC questions:  0=Performs both tasks correctly  1c. LOC commands: 0=Performs both tasks correctly  2.  Best Gaze: 0=normal  3.  Visual: 0=No visual loss  4. Facial Palsy: 0=Normal symmetric movement  5a.  Motor left arm: 0=No drift, limb holds 90 (or 45) degrees for full 10 seconds  5b.  Motor right arm: 0=No drift, limb holds 90 (or 45) degrees for full 10 seconds  6a. motor left leg: 0=No drift, limb holds 90 (or 45) degrees for full 10 seconds  6b  Motor right leg:  0=No drift, limb holds 90 (or 45) degrees for full 10 seconds  7. Limb Ataxia: 0=Absent  8.  Sensory: 1=Mild to moderate sensory loss; patient feels pinprick is  less sharp or is dull on the affected side; there is a loss of superficial pain with pinprick but patient is aware He is being touched  9. Best Language:  0=No aphasia, normal  10. Dysarthria: 0=Normal  11. Extinction and Inattention: 0=No abnormality  12. Distal motor function: 0=Normal   Total:   1    ____________________________________________   LABS (  all labs ordered are listed, but only abnormal results are displayed)  Labs Reviewed  COMPREHENSIVE METABOLIC PANEL - Abnormal; Notable for the following:       Result Value   Glucose, Bld 199 (*)    Calcium 8.7 (*)    ALT 15 (*)    All other components within normal limits  URINALYSIS, COMPLETE (UACMP) WITH MICROSCOPIC - Abnormal; Notable for the following:    Color, Urine YELLOW (*)    APPearance CLEAR (*)    Glucose, UA 150 (*)    Squamous Epithelial / LPF 0-5 (*)    All other components within normal limits  TROPONIN I - Abnormal; Notable for the following:    Troponin I 0.04 (*)    All other components within normal limits  ACETAMINOPHEN LEVEL - Abnormal; Notable for the following:    Acetaminophen (Tylenol), Serum <10 (*)    All other components within normal limits  URINE DRUG SCREEN, QUALITATIVE ([facility] ONLY) - Abnormal; Notable for the following:    Cocaine Metabolite,Ur [place] POSITIVE (*)    Opiate, Ur Screen POSITIVE (*)    Cannabinoid 50 Ng, Ur  POSITIVE (*)    All other components within normal limits  TROPONIN I - Abnormal; Notable for the following:    Troponin I 0.03 (*)    All other components within normal limits  LIPASE, BLOOD  CBC WITH DIFFERENTIAL/PLATELET  SALICYLATE LEVEL   ____________________________________________  EKG  ED ECG REPORT I, Arelia Longest, the attending physician, personally viewed and interpreted this ECG.   Date: 03/10/2016  EKG Time: 1018  Rate: 90  Rhythm: normal sinus rhythm  Axis: normal  Intervals:nonspecific intraventricular conduction delay  ST&T Change: No  ST segment elevation or depression. T-wave inversions in V4 through V6 as well as 1 and aVL. EKG without significant changes from that on the record of 11/27/2015. ____________________________________________  RADIOLOGY  CT Head Wo Contrast (Final result)  Result time 03/10/16 11:42:16  Final result by Loralie Champagne, MD (03/10/16 11:42:16)           Narrative:   CLINICAL DATA: Left-sided numbness today.  EXAM: CT HEAD WITHOUT CONTRAST  TECHNIQUE: Contiguous axial images were obtained from the base of the skull through the vertex without intravenous contrast.  COMPARISON: 11/27/2015  FINDINGS: Brain: Stable age related cerebral atrophy, ventriculomegaly and periventricular white matter disease. No extra-axial fluid collections are identified. Remote left cerebellar hemispheric infarction with encephalomalacia. No CT findings for acute hemispheric infarction or intracranial hemorrhage. No mass lesions. The brainstem and cerebellum are normal.  Vascular: Stable vascular calcifications. No definite aneurysm or hyperdense vessels.  Skull: No bone lesions or acute fracture.  Sinuses/Orbits: Ethmoid sinus disease. The other paranasal sinuses mastoid air cells are clear. The globes are intact.  Other: No scalp lesions or hematoma.  IMPRESSION: Stable age related cerebral atrophy, ventriculomegaly and periventricular white matter disease.  Remote left cerebellar hemispheric infarction with encephalomalacia.  No new/ acute intracranial findings.   Electronically Signed By: Rudie Meyer M.D. On: 03/10/2016 11:42            DG Chest 1 View (Final result)  Result time 03/10/16 11:00:01  Final result by Roque Lias, MD (03/10/16 11:00:01)           Narrative:   CLINICAL DATA: Chest pain.  EXAM: CHEST 1 VIEW  COMPARISON: Radiographs of November 27, 2015.  FINDINGS: The heart size and mediastinal contours are within normal limits. Both lungs are  clear. No pneumothorax  or pleural effusion is noted. The visualized skeletal structures are unremarkable.  IMPRESSION: No acute cardiopulmonary abnormality seen.   Electronically Signed By: Lupita Raider, M.D. On: 03/10/2016 11:00          ____________________________________________   PROCEDURES  Procedure(s) performed:   Procedures  Critical Care performed:   ____________________________________________   INITIAL IMPRESSION / ASSESSMENT AND PLAN / ED COURSE  Pertinent labs & imaging results that were available during my care of the patient were reviewed by me and considered in my medical decision making (see chart for details). ----------------------------------------- 2:49 PM on 03/10/2016 -----------------------------------------  Patient with very reassuring lab as well as imaging workup. Troponin is trending downwards. Patient without any distress at this time. MRI without any new stroke. Cocaine positive which likely plans the patient's constellation of symptoms. Evaluated by a specialist on call psychiatry was recommending inpatient psychiatric admission. The patient is aware of this plan for [place] and willing to comply. We also discussed stopping his cocaine usage which he says he plans to do.      ____________________________________________   FINAL CLINICAL IMPRESSION(S) / ED DIAGNOSES  Suicidal ideation. Cocaine abuse. Chest pain. Paresthesia.    NEW MEDICATIONS STARTED DURING THIS VISIT:  New Prescriptions   No medications on file     Note:  This document was prepared using Dragon voice recognition software and may include unintentional dictation errors.    Myrna Blazer, MD 03/10/16 [phone]

## 2016-03-10 NOTE — ED Notes (Addendum)
Received from Mellon FinancialEric RN. Patient currently resting in bed with eyes closed.

## 2016-03-10 NOTE — ED Notes (Signed)
IVC 

## 2016-03-10 NOTE — ED Notes (Signed)
EDP restarted BP medications. Recheck of BP 178/100.

## 2016-03-10 NOTE — ED Notes (Signed)
Pt has knife locked up in security. Key in [employer]

## 2016-03-10 NOTE — ED Notes (Signed)
Transported to MRI with EDT and [employer] officer

## 2016-03-10 NOTE — ED Notes (Signed)
Dressed out by [employer] EDT.

## 2016-03-11 ENCOUNTER — Encounter: Payer: Self-pay | Admitting: Psychiatry

## 2016-03-11 ENCOUNTER — Inpatient Hospital Stay
Admission: EM | Admit: 2016-03-11 | Discharge: 2016-03-13 | DRG: 885 | Disposition: A | Discharge status: Home / Self Care | Payer: Medicaid Other | Source: Intra-hospital | Attending: Psychiatry | Admitting: Psychiatry

## 2016-03-11 DIAGNOSIS — I25119 Atherosclerotic heart disease of native coronary artery with unspecified angina pectoris: Secondary | ICD-10-CM | POA: Diagnosis present

## 2016-03-11 DIAGNOSIS — F1590 Other stimulant use, unspecified, uncomplicated: Secondary | ICD-10-CM | POA: Diagnosis present

## 2016-03-11 DIAGNOSIS — Z6832 Body mass index (BMI) 32.0-32.9, adult: Secondary | ICD-10-CM

## 2016-03-11 DIAGNOSIS — E785 Hyperlipidemia, unspecified: Secondary | ICD-10-CM | POA: Diagnosis present

## 2016-03-11 DIAGNOSIS — E1165 Type 2 diabetes mellitus with hyperglycemia: Secondary | ICD-10-CM | POA: Diagnosis present

## 2016-03-11 DIAGNOSIS — F1020 Alcohol dependence, uncomplicated: Secondary | ICD-10-CM | POA: Diagnosis present

## 2016-03-11 DIAGNOSIS — Z794 Long term (current) use of insulin: Secondary | ICD-10-CM

## 2016-03-11 DIAGNOSIS — I350 Nonrheumatic aortic (valve) stenosis: Secondary | ICD-10-CM

## 2016-03-11 DIAGNOSIS — IMO0002 Reserved for concepts with insufficient information to code with codable children: Secondary | ICD-10-CM | POA: Diagnosis present

## 2016-03-11 DIAGNOSIS — E669 Obesity, unspecified: Secondary | ICD-10-CM | POA: Diagnosis present

## 2016-03-11 DIAGNOSIS — Z9114 Patient's other noncompliance with medication regimen: Secondary | ICD-10-CM

## 2016-03-11 DIAGNOSIS — F17210 Nicotine dependence, cigarettes, uncomplicated: Secondary | ICD-10-CM | POA: Diagnosis present

## 2016-03-11 DIAGNOSIS — R45851 Suicidal ideations: Secondary | ICD-10-CM | POA: Diagnosis present

## 2016-03-11 DIAGNOSIS — F332 Major depressive disorder, recurrent severe without psychotic features: Secondary | ICD-10-CM | POA: Diagnosis not present

## 2016-03-11 DIAGNOSIS — I2510 Atherosclerotic heart disease of native coronary artery without angina pectoris: Secondary | ICD-10-CM | POA: Diagnosis present

## 2016-03-11 DIAGNOSIS — K219 Gastro-esophageal reflux disease without esophagitis: Secondary | ICD-10-CM | POA: Diagnosis present

## 2016-03-11 DIAGNOSIS — Z811 Family history of alcohol abuse and dependence: Secondary | ICD-10-CM | POA: Diagnosis not present

## 2016-03-11 DIAGNOSIS — I639 Cerebral infarction, unspecified: Secondary | ICD-10-CM | POA: Diagnosis present

## 2016-03-11 DIAGNOSIS — I517 Cardiomegaly: Secondary | ICD-10-CM | POA: Diagnosis present

## 2016-03-11 DIAGNOSIS — G3184 Mild cognitive impairment, so stated: Secondary | ICD-10-CM | POA: Diagnosis present

## 2016-03-11 DIAGNOSIS — Z8673 Personal history of transient ischemic attack (TIA), and cerebral infarction without residual deficits: Secondary | ICD-10-CM | POA: Diagnosis not present

## 2016-03-11 DIAGNOSIS — F17200 Nicotine dependence, unspecified, uncomplicated: Secondary | ICD-10-CM | POA: Diagnosis present

## 2016-03-11 DIAGNOSIS — F1410 Cocaine abuse, uncomplicated: Secondary | ICD-10-CM | POA: Diagnosis present

## 2016-03-11 DIAGNOSIS — G8929 Other chronic pain: Secondary | ICD-10-CM | POA: Diagnosis present

## 2016-03-11 DIAGNOSIS — G4700 Insomnia, unspecified: Secondary | ICD-10-CM | POA: Diagnosis present

## 2016-03-11 DIAGNOSIS — R440 Auditory hallucinations: Secondary | ICD-10-CM | POA: Diagnosis present

## 2016-03-11 DIAGNOSIS — F0670 Mild neurocognitive disorder due to known physiological condition without behavioral disturbance: Secondary | ICD-10-CM

## 2016-03-11 DIAGNOSIS — Z79899 Other long term (current) drug therapy: Secondary | ICD-10-CM

## 2016-03-11 DIAGNOSIS — I10 Essential (primary) hypertension: Secondary | ICD-10-CM | POA: Diagnosis present

## 2016-03-11 DIAGNOSIS — Z23 Encounter for immunization: Secondary | ICD-10-CM | POA: Diagnosis not present

## 2016-03-11 DIAGNOSIS — I119 Hypertensive heart disease without heart failure: Secondary | ICD-10-CM | POA: Diagnosis present

## 2016-03-11 DIAGNOSIS — Z7982 Long term (current) use of aspirin: Secondary | ICD-10-CM

## 2016-03-11 LAB — TSH: TSH: 0.833 u[IU]/mL (ref 0.350–4.500)

## 2016-03-11 LAB — VITAMIN B12: VITAMIN B 12: 245 pg/mL (ref 180–914)

## 2016-03-11 LAB — GLUCOSE, CAPILLARY
GLUCOSE-CAPILLARY: 163 mg/dL — AB (ref 65–99)
Glucose-Capillary: 242 mg/dL — ABNORMAL HIGH (ref 65–99)
Glucose-Capillary: 162 mg/dL — ABNORMAL HIGH (ref 65–99)

## 2016-03-11 MED ORDER — PANTOPRAZOLE SODIUM 40 MG PO TBEC
40.0000 mg | DELAYED_RELEASE_TABLET | Freq: Every day | ORAL | Status: DC
  Administered 2016-03-12 – 2016-03-13 (×2): 40 mg via ORAL
  Filled 2016-03-11 (×3): qty 1

## 2016-03-11 MED ORDER — MELOXICAM 7.5 MG PO TABS
7.5000 mg | ORAL_TABLET | Freq: Every day | ORAL | Status: DC
  Administered 2016-03-11 – 2016-03-13 (×3): 7.5 mg via ORAL
  Filled 2016-03-11 (×3): qty 1

## 2016-03-11 MED ORDER — CHLORTHALIDONE 25 MG PO TABS
25.0000 mg | ORAL_TABLET | Freq: Every day | ORAL | Status: DC
  Administered 2016-03-12 – 2016-03-13 (×2): 25 mg via ORAL
  Filled 2016-03-11 (×3): qty 1

## 2016-03-11 MED ORDER — METHOCARBAMOL 500 MG PO TABS
1500.0000 mg | ORAL_TABLET | Freq: Four times a day (QID) | ORAL | Status: DC
  Administered 2016-03-11 – 2016-03-12 (×5): 1500 mg via ORAL
  Filled 2016-03-11 (×5): qty 3

## 2016-03-11 MED ORDER — SERTRALINE HCL 25 MG PO TABS
50.0000 mg | ORAL_TABLET | Freq: Every day | ORAL | Status: DC
  Administered 2016-03-11 – 2016-03-13 (×3): 50 mg via ORAL
  Filled 2016-03-11 (×3): qty 2

## 2016-03-11 MED ORDER — NAPROXEN 500 MG PO TABS
500.0000 mg | ORAL_TABLET | Freq: Two times a day (BID) | ORAL | Status: DC
Start: 2016-03-11 — End: 2016-03-11

## 2016-03-11 MED ORDER — INFLUENZA VAC SPLIT QUAD 0.5 ML IM SUSY
0.5000 mL | PREFILLED_SYRINGE | INTRAMUSCULAR | Status: AC
  Administered 2016-03-13: 0.5 mL via INTRAMUSCULAR
  Filled 2016-03-11: qty 0.5

## 2016-03-11 MED ORDER — TUBERCULIN PPD 5 UNIT/0.1ML ID SOLN
5.0000 [IU] | Freq: Once | INTRADERMAL | Status: DC
  Administered 2016-03-11: 5 [IU] via INTRADERMAL
  Filled 2016-03-11 (×2): qty 0.1

## 2016-03-11 MED ORDER — LISINOPRIL 20 MG PO TABS
40.0000 mg | ORAL_TABLET | Freq: Every day | ORAL | Status: DC
  Administered 2016-03-11: 40 mg via ORAL
  Filled 2016-03-11: qty 2

## 2016-03-11 MED ORDER — INSULIN ASPART 100 UNIT/ML ~~LOC~~ SOLN
10.0000 [IU] | Freq: Two times a day (BID) | SUBCUTANEOUS | Status: DC
  Administered 2016-03-11 – 2016-03-13 (×4): 10 [IU] via SUBCUTANEOUS
  Filled 2016-03-11 (×3): qty 10
  Filled 2016-03-11: qty 1
  Filled 2016-03-11: qty 3

## 2016-03-11 MED ORDER — CLOPIDOGREL BISULFATE 75 MG PO TABS
75.0000 mg | ORAL_TABLET | Freq: Every day | ORAL | Status: DC
  Administered 2016-03-11 – 2016-03-13 (×3): 75 mg via ORAL
  Filled 2016-03-11 (×5): qty 1

## 2016-03-11 MED ORDER — ACETAMINOPHEN 325 MG PO TABS
650.0000 mg | ORAL_TABLET | Freq: Four times a day (QID) | ORAL | Status: DC | PRN
  Administered 2016-03-11 – 2016-03-12 (×2): 650 mg via ORAL
  Filled 2016-03-11 (×2): qty 2

## 2016-03-11 MED ORDER — CHLORTHALIDONE 25 MG PO TABS: 25.0000 mg | ORAL_TABLET | Freq: Every day | ORAL | Status: DC

## 2016-03-11 MED ORDER — NITROGLYCERIN 0.4 MG SL SUBL: 0.4000 mg | SUBLINGUAL_TABLET | SUBLINGUAL | Status: DC | PRN

## 2016-03-11 MED ORDER — METFORMIN HCL 500 MG PO TABS
500.0000 mg | ORAL_TABLET | Freq: Two times a day (BID) | ORAL | Status: DC
  Administered 2016-03-11 – 2016-03-13 (×4): 500 mg via ORAL
  Filled 2016-03-11 (×4): qty 1

## 2016-03-11 MED ORDER — MAGNESIUM HYDROXIDE 400 MG/5ML PO SUSP
30.0000 mL | Freq: Every day | ORAL | Status: DC | PRN
Start: 2016-03-11 — End: 2016-03-13

## 2016-03-11 MED ORDER — ATORVASTATIN CALCIUM 20 MG PO TABS
20.0000 mg | ORAL_TABLET | Freq: Every day | ORAL | Status: DC
  Administered 2016-03-11 – 2016-03-12 (×2): 20 mg via ORAL
  Filled 2016-03-11 (×2): qty 1

## 2016-03-11 MED ORDER — INSULIN ASPART 100 UNIT/ML ~~LOC~~ SOLN
0.0000 [IU] | Freq: Three times a day (TID) | SUBCUTANEOUS | Status: DC
  Administered 2016-03-11: 2 [IU] via SUBCUTANEOUS
  Administered 2016-03-11 – 2016-03-12 (×2): 3 [IU] via SUBCUTANEOUS
  Administered 2016-03-12: 2 [IU] via SUBCUTANEOUS
  Administered 2016-03-12 – 2016-03-13 (×2): 3 [IU] via SUBCUTANEOUS
  Administered 2016-03-13: 2 [IU] via SUBCUTANEOUS
  Filled 2016-03-11: qty 1
  Filled 2016-03-11: qty 2
  Filled 2016-03-11: qty 3
  Filled 2016-03-11: qty 1
  Filled 2016-03-11: qty 3

## 2016-03-11 MED ORDER — ALUM & MAG HYDROXIDE-SIMETH 200-200-20 MG/5ML PO SUSP: 30.0000 mL | ORAL | Status: DC | PRN

## 2016-03-11 MED ORDER — AMLODIPINE BESYLATE 5 MG PO TABS
10.0000 mg | ORAL_TABLET | Freq: Every day | ORAL | Status: DC
  Administered 2016-03-12 – 2016-03-13 (×2): 10 mg via ORAL
  Filled 2016-03-11 (×3): qty 2

## 2016-03-11 MED ORDER — CLONIDINE HCL 0.1 MG PO TABS
0.2000 mg | ORAL_TABLET | Freq: Two times a day (BID) | ORAL | Status: DC
  Administered 2016-03-11 – 2016-03-13 (×4): 0.2 mg via ORAL
  Filled 2016-03-11 (×4): qty 2

## 2016-03-11 MED ORDER — ASPIRIN EC 81 MG PO TBEC
81.0000 mg | DELAYED_RELEASE_TABLET | Freq: Every day | ORAL | Status: DC
  Administered 2016-03-12 – 2016-03-13 (×2): 81 mg via ORAL
  Filled 2016-03-11 (×3): qty 1

## 2016-03-11 MED ORDER — TRAZODONE HCL 50 MG PO TABS
150.0000 mg | ORAL_TABLET | Freq: Every day | ORAL | Status: DC
  Administered 2016-03-11 – 2016-03-12 (×2): 150 mg via ORAL
  Filled 2016-03-11 (×2): qty 1

## 2016-03-11 MED ORDER — INSULIN ASPART 100 UNIT/ML ~~LOC~~ SOLN: 0.0000 [IU] | Freq: Every day | SUBCUTANEOUS | Status: DC

## 2016-03-11 MED ORDER — LISINOPRIL 10 MG PO TABS
40.0000 mg | ORAL_TABLET | Freq: Every day | ORAL | Status: DC
Start: 2016-03-11 — End: 2016-03-13
  Administered 2016-03-11 – 2016-03-13 (×3): 40 mg via ORAL
  Filled 2016-03-11 (×3): qty 4

## 2016-03-11 NOTE — Progress Notes (Signed)
Recreation Therapy Notes  Date: 02.05.18 Time: 1:00 pm Location: Craft Room  Group Topic: Self-expression  Goal Area(s) Addresses:  Patient will effectively use art as a means of self-expression. Patient will be able to identify one emotion experienced during group session.  Behavioral Response: Attentive, Interactive  Intervention: Bottled Up  Activity: Patients were instructed to draw a bottle the way they see themselves. Inside the bottle, patients were instructed to write all the emotions they were feeling.  Education: LRT educated patients on different forms of self-expression.  Education Outcome: Patient left before LRT educated group.  Clinical Observations/Feedback: Patient drew bottle and wrote his emotions. Patient left group at approximately 1:22 pm with PA student. Patient did not return to group.  Jacquelynn CreeGreene,[name] [name], LRT/CTRS 03/11/2016 3:45 PM

## 2016-03-11 NOTE — ED Notes (Signed)
Pt is agreeable for adm , he has had no further c/o chest pain or slurred speech or has no somatic c/o at all he is pleasant and cooperative and in no distress

## 2016-03-11 NOTE — [place] FL2 (Signed)
[place] MEDICAID FL2 LEVEL OF CARE SCREENING TOOL     IDENTIFICATION  Patient Name: [name] LimLinnies Drummer Jr. Birthdate: 1960/10/28 Sex: male Admission Date (Current Location): 03/11/2016  [place]  [place] [id number] [facility] Facility and Address:  [facility], [address], [place], [place] [address]      Provider Number: [id number]  Attending Physician Name and Address:  Barnabas HarriesAndrea Hernandez-Gonzale*  Relative Name and Phone Number:       Current Level of Care: Hospital Recommended Level of Care: Family Care Home (Group home) Prior Approval Number:    Date Approved/Denied:   PASRR Number:    Discharge Plan: Other (Comment) (Group home/Family care home)    Current Diagnoses: Patient Active Problem List   Diagnosis Date Noted  . Major depressive disorder, recurrent severe without psychotic features (HCC) 11/07/2014  . Tobacco use disorder 11/07/2014  . Stimulant use disorder (HCC) crack cocaine 11/07/2014  . Alcohol use disorder, moderate, dependence (HCC) 11/07/2014  . CAD (coronary artery disease) 11/05/2014  . Cerebral infarction (HCC) 10/19/2014  . LVH (left ventricular hypertrophy) 05/20/2013  . Obesity (BMI 30-39.9) 04/10/2013  . Aortic stenosis, mild 03/07/2013  . DM (diabetes mellitus), type 2, uncontrolled (HCC) 01/05/2013  . Essential hypertension, malignant 01/25/2009    Orientation RESPIRATION BLADDER Height & Weight     Self, Time, Situation, Place (x4)  Normal Continent Weight: 230 lb (104.3 kg) Height:  5\' 11"  (180.3 cm)  BEHAVIORAL SYMPTOMS/MOOD NEUROLOGICAL BOWEL NUTRITION STATUS     (N/A) Continent  (N/A)  AMBULATORY STATUS COMMUNICATION OF NEEDS Skin   Independent Verbally Normal                       Personal Care Assistance Level of Assistance              Functional Limitations Info   (N/A)          SPECIAL CARE FACTORS FREQUENCY  Blood pressure                    Contractures  Contractures Info: Not present    Additional Factors Info  Insulin Sliding Scale               Current Medications (03/11/2016):  This is the current hospital active medication list Current Facility-Administered Medications  Medication Dose Route Frequency Provider Last Rate Last Dose  . acetaminophen (TYLENOL) tablet 650 mg  650 mg Oral Q6H PRN Audery AmelJohn T Clapacs, MD   650 mg at 03/11/16 1134  . alum & mag hydroxide-simeth (MAALOX/MYLANTA) 200-200-20 MG/5ML suspension 30 mL  30 mL Oral Q4H PRN Audery AmelJohn T Clapacs, MD      . amLODipine (NORVASC) tablet 10 mg  10 mg Oral Daily Audery AmelJohn T Clapacs, MD      . aspirin EC tablet 81 mg  81 mg Oral Daily Audery AmelJohn T Clapacs, MD      . atorvastatin (LIPITOR) tablet 20 mg  20 mg Oral q1800 Audery AmelJohn T Clapacs, MD      . chlorthalidone (HYGROTON) tablet 25 mg  25 mg Oral Daily John T Clapacs, MD      . cloNIDine (CATAPRES) tablet 0.2 mg  0.2 mg Oral BID Audery AmelJohn T Clapacs, MD      . clopidogrel (PLAVIX) tablet 75 mg  75 mg Oral Daily Audery AmelJohn T Clapacs, MD      . Melene Muller[START ON 03/12/2016] Influenza vac split quadrivalent PF (FLUARIX) injection 0.5 mL  0.5 mL Intramuscular  Tomorrow-1000 Jimmy Footman, MD      . insulin aspart (novoLOG) injection 0-5 Units  0-5 Units Subcutaneous QHS Jimmy Footman, MD      . insulin aspart (novoLOG) injection 0-9 Units  0-9 Units Subcutaneous TID WC Jimmy Footman, MD   3 Units at 03/11/16 1250  . insulin aspart (novoLOG) injection 10 Units  10 Units Subcutaneous BID WC Audery Amel, MD      . lisinopril (PRINIVIL,ZESTRIL) tablet 40 mg  40 mg Oral Daily Audery Amel, MD   40 mg at 03/11/16 1134  . magnesium hydroxide (MILK OF MAGNESIA) suspension 30 mL  30 mL Oral Daily PRN Audery Amel, MD      . meloxicam (MOBIC) tablet 7.5 mg  7.5 mg Oral Daily Audery Amel, MD   7.5 mg at 03/11/16 1135  . metFORMIN (GLUCOPHAGE) tablet 500 mg  500 mg Oral BID WC Audery Amel, MD      . methocarbamol (ROBAXIN) tablet 1,500  mg  1,500 mg Oral QID Audery Amel, MD   1,500 mg at 03/11/16 1134  . nitroGLYCERIN (NITROSTAT) SL tablet 0.4 mg  0.4 mg Sublingual Q5 min PRN Audery Amel, MD      . pantoprazole (PROTONIX) EC tablet 40 mg  40 mg Oral Daily Audery Amel, MD      . sertraline (ZOLOFT) tablet 50 mg  50 mg Oral Daily Audery Amel, MD   50 mg at 03/11/16 1134  . traZODone (DESYREL) tablet 150 mg  150 mg Oral QHS Audery Amel, MD      . tuberculin injection 5 Units  5 Units Intradermal Once Jimmy Footman, MD         Discharge Medications: Please see discharge summary for a list of discharge medications.  Relevant Imaging Results:  Relevant Lab Results:   Additional Information    Lynden Oxford, LCSWA

## 2016-03-11 NOTE — [facility] Suicide Risk Assessment (Signed)
[facility] INPATIENT:  Family/Significant Other Suicide Prevention Education  Suicide Prevention Education:  Patient Refusal for Family/Significant Other Suicide Prevention Education: The patient [name] Michaell Cowingride Jr. has refused to provide written consent for family/significant other to be provided Family/Significant Other Suicide Prevention Education during admission and/or prior to discharge.  Physician notified.  Lynden OxfordKadijah R Kimmora Risenhoover, MSW, LCSW-A 03/11/2016, 2:04 PM

## 2016-03-11 NOTE — Progress Notes (Signed)
Patient pleasant and cooperative during admission assessment. Patient denies SI/HI at this time. Patient denies AVH. Patient informed of fall risk status, fall risk assessed "low" at this time. Patient oriented to unit/staff/room. Patient denies any questions/concerns at this time. Patient safe on unit with Q15 minute checks for safety. Skin assessment & body search done.No contraband found. 

## 2016-03-11 NOTE — H&P (Signed)
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  Physician Treatment Plan for Primary Diagnosis: Major depressive disorder, recurrent severe without psychotic features ([place]) Long Term Goal(s): Improvement in symptoms so as ready for discharge  Short Term Goals: Ability to identify changes in lifestyle to reduce recurrence of condition will improve, Ability to verbalize feelings will improve, Ability to disclose and discuss suicidal ideas, Ability to demonstrate self-control will improve, Ability to identify and develop effective coping behaviors will improve, Compliance with prescribed medications will improve and Ability to identify triggers associated with substance abuse/mental health issues will improve  Physician Treatment Plan for Secondary Diagnosis: Principal Problem:   Major depressive disorder, recurrent severe without psychotic features ([place]) Active Problems:   Essential hypertension, malignant   DM (diabetes mellitus), type 2, uncontrolled ([place])   Aortic stenosis,  mild   Obesity (BMI 30-39.9)   LVH (left ventricular hypertrophy)   Cerebral infarction (HCC)   CAD (coronary artery disease)   Tobacco use disorder   Stimulant use disorder (HCC) crack cocaine   Alcohol use disorder, moderate, dependence ([place])  Long Term Goal(s): Improvement in symptoms so as ready for discharge  Short Term Goals: Ability to identify changes in  lifestyle to reduce recurrence of condition will improve, Ability to demonstrate self-control will improve, Compliance with prescribed medications will improve and Ability to identify triggers associated with substance abuse/mental health issues will improve  I certify that inpatient services furnished can reasonably be expected to improve the patient's condition.    Hildred Priest, MD 2/5/201811:28 AM

## 2016-03-11 NOTE — Tx Team (Signed)
Initial Treatment Plan 03/11/2016 6:19 PM [name] ZOX:[id number]    PATIENT STRESSORS: Health problems Medication change or noncompliance Substance abuse   PATIENT STRENGTHS: Ability for insight Average or above average intelligence Communication skills   PATIENT IDENTIFIED PROBLEMS: Major depression 03/11/2016  Bipolar disorder 03/11/2016                   DISCHARGE CRITERIA:  Ability to meet basic life and health needs Medical problems require only outpatient monitoring Verbal commitment to aftercare and medication compliance  PRELIMINARY DISCHARGE PLAN: Attend aftercare/continuing care group Placement in alternative living arrangements  PATIENT/FAMILY INVOLVEMENT: This treatment plan has been presented to and reviewed with the patient, [name] Michaell CowingPride Jr., and/or family member, .  The patient and family have been given the opportunity to ask questions and make suggestions.  Leonarda SalonGigi George Asencion Guisinger, RN 03/11/2016, 6:19 PM
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
## 2016-03-11 NOTE — [facility] Suicide Risk Assessment (Signed)
[facility]   Nursing information obtained from:    Demographic factors:    Current Mental Status:    Loss Factors:    Historical Factors:    Risk Reduction Factors:     Total Time spent with patient: 1 hour Principal Problem: Major depressive disorder, recurrent severe without psychotic features (HCC) Diagnosis:   Patient Active Problem List   Diagnosis Date Noted  . Major depressive disorder, recurrent severe without psychotic features (HCC) [F33.2] 11/07/2014  . Tobacco use disorder [F17.200] 11/07/2014  . Stimulant use disorder (HCC) crack cocaine [F15.90] 11/07/2014  . Alcohol use disorder, moderate, dependence (HCC) [F10.20] 11/07/2014  . CAD (coronary artery disease) [I25.10] 11/05/2014  . Cerebral infarction (HCC) [I63.9] 10/19/2014  . LVH (left ventricular hypertrophy) [I51.7] 05/20/2013  . Obesity (BMI 30-39.9) [E66.9] 04/10/2013  . Aortic stenosis, mild [I35.0] 03/07/2013  . DM (diabetes mellitus), type 2, uncontrolled (HCC) [E11.65] 01/05/2013  . Essential hypertension, malignant [I10] 01/25/2009   Subjective Data:   Continued Clinical Symptoms:  Alcohol Use Disorder Identification Test Final Score (AUDIT): 1 The "Alcohol Use Disorders Identification Test", Guidelines for Use in Primary Care, Second Edition.  World Science writerHealth Organization [facility](WHO). Score between 0-7:  no or low risk or alcohol related problems. Score between 8-15:  moderate risk of alcohol related problems. Score between 16-19:  high risk of alcohol related problems. Score 20 or above:  warrants further diagnostic evaluation for alcohol dependence and treatment.   CLINICAL FACTORS:   Depression:   Comorbid alcohol abuse/dependence Impulsivity Severe Alcohol/Substance Abuse/Dependencies More than one psychiatric diagnosis Previous Psychiatric Diagnoses and Treatments Medical Diagnoses and Treatments/Surgeries   Psychiatric Specialty Exam: Physical Exam  Constitutional:  He is oriented to person, place, and time. He appears well-developed and well-nourished.  HENT:  Head: Normocephalic and atraumatic.  Eyes: Conjunctivae and EOM are normal.  Neck: Normal range of motion.  Respiratory: Effort normal.  Musculoskeletal: Normal range of motion.  Neurological: He is alert and oriented to person, place, and time.    ROS  Blood pressure 132/74, pulse 68, temperature 98.2 F (36.8 C), temperature source Oral, resp. rate 18, height 5\' 11"  (1.803 m), weight 104.3 kg (230 lb), SpO2 100 %.Body mass index is 32.08 kg/m.                                                    Sleep:         COGNITIVE FEATURES THAT CONTRIBUTE TO RISK:  Closed-mindedness    SUICIDE RISK:   Moderate:  Frequent suicidal ideation with limited intensity, and duration, some specificity in terms of plans, no associated intent, good self-control, limited dysphoria/symptomatology, some risk factors present, and identifiable protective factors, including available and accessible social support.  [facility]  I certify that inpatient services furnished can reasonably be expected to improve the patient's condition.   Jimmy FootmanHernandez-Gonzalez,  Raiza Kiesel, MD 03/11/2016, 11:27 AM

## 2016-03-11 NOTE — ED Notes (Signed)
Pt being adm to [facility] he has no c/o of chest discomfort or any other somatic c/o he is in no distress his vss

## 2016-03-12 DIAGNOSIS — F0670 Mild neurocognitive disorder due to known physiological condition without behavioral disturbance: Secondary | ICD-10-CM

## 2016-03-12 DIAGNOSIS — G3184 Mild cognitive impairment, so stated: Secondary | ICD-10-CM

## 2016-03-12 LAB — GLUCOSE, CAPILLARY
GLUCOSE-CAPILLARY: 210 mg/dL — AB (ref 65–99)
GLUCOSE-CAPILLARY: 181 mg/dL — AB (ref 65–99)
Glucose-Capillary: 177 mg/dL — ABNORMAL HIGH (ref 65–99)
Glucose-Capillary: 150 mg/dL — ABNORMAL HIGH (ref 65–99)

## 2016-03-12 LAB — LIPID PANEL
Cholesterol: 137 mg/dL (ref 0–200)
HDL: 22 mg/dL — ABNORMAL LOW (ref >40)
LDL CALC: 83 mg/dL (ref 0–99)
TRIGLYCERIDES: 160 mg/dL — AB (ref <150)
Total CHOL/HDL Ratio: 6.2 RATIO
VLDL: 32 mg/dL (ref 0–40)

## 2016-03-12 MED ORDER — SERTRALINE HCL 50 MG PO TABS: 50.0000 mg | ORAL_TABLET | Freq: Every day | ORAL | 0 refills | Status: DC

## 2016-03-12 MED ORDER — INSULIN ASPART 100 UNIT/ML ~~LOC~~ SOLN: 10.0000 [IU] | Freq: Two times a day (BID) | SUBCUTANEOUS | 0 refills | Status: DC

## 2016-03-12 MED ORDER — PANTOPRAZOLE SODIUM 40 MG PO TBEC: 40.0000 mg | DELAYED_RELEASE_TABLET | Freq: Every day | ORAL | 0 refills | Status: DC

## 2016-03-12 MED ORDER — LORATADINE 10 MG PO TABS
10.0000 mg | ORAL_TABLET | Freq: Every day | ORAL | Status: DC
  Administered 2016-03-12 – 2016-03-13 (×2): 10 mg via ORAL
  Filled 2016-03-12 (×2): qty 1

## 2016-03-12 MED ORDER — LISINOPRIL 40 MG PO TABS: 40.0000 mg | ORAL_TABLET | Freq: Every day | ORAL | 0 refills | Status: DC

## 2016-03-12 MED ORDER — CLOPIDOGREL BISULFATE 75 MG PO TABS: 75.0000 mg | ORAL_TABLET | Freq: Every day | ORAL | 0 refills | Status: DC

## 2016-03-12 MED ORDER — ASPIRIN 81 MG PO TBEC: 81.0000 mg | DELAYED_RELEASE_TABLET | Freq: Every day | ORAL | 0 refills | Status: DC

## 2016-03-12 MED ORDER — NITROGLYCERIN 0.4 MG SL SUBL: 0.4000 mg | SUBLINGUAL_TABLET | SUBLINGUAL | 0 refills | Status: DC | PRN

## 2016-03-12 MED ORDER — CHLORTHALIDONE 25 MG PO TABS: 25.0000 mg | ORAL_TABLET | Freq: Every day | ORAL | 0 refills | Status: DC

## 2016-03-12 MED ORDER — METHOCARBAMOL 750 MG PO TABS
750.0000 mg | ORAL_TABLET | Freq: Four times a day (QID) | ORAL | Status: DC
  Administered 2016-03-12 – 2016-03-13 (×4): 750 mg via ORAL
  Filled 2016-03-12 (×5): qty 1

## 2016-03-12 MED ORDER — AMLODIPINE BESYLATE 10 MG PO TABS: 10.0000 mg | ORAL_TABLET | Freq: Every day | ORAL | 0 refills | Status: DC

## 2016-03-12 MED ORDER — MELOXICAM 7.5 MG PO TABS: 7.5000 mg | ORAL_TABLET | Freq: Every day | ORAL | 0 refills | Status: DC

## 2016-03-12 MED ORDER — GUAIFENESIN ER 600 MG PO TB12
1200.0000 mg | ORAL_TABLET | Freq: Two times a day (BID) | ORAL | Status: DC
  Administered 2016-03-12 – 2016-03-13 (×2): 1200 mg via ORAL
  Filled 2016-03-12 (×5): qty 2

## 2016-03-12 MED ORDER — METHOCARBAMOL 750 MG PO TABS: 750.0000 mg | ORAL_TABLET | Freq: Four times a day (QID) | ORAL | 0 refills | Status: DC | PRN

## 2016-03-12 MED ORDER — CYANOCOBALAMIN 1000 MCG/ML IJ SOLN
1000.0000 ug | Freq: Every day | INTRAMUSCULAR | Status: DC
  Administered 2016-03-12 – 2016-03-13 (×2): 1000 ug via SUBCUTANEOUS
  Filled 2016-03-12 (×2): qty 1

## 2016-03-12 MED ORDER — TRAZODONE HCL 150 MG PO TABS: 150.0000 mg | ORAL_TABLET | Freq: Every day | ORAL | 0 refills | Status: DC

## 2016-03-12 MED ORDER — CLONIDINE HCL 0.2 MG PO TABS: 0.2000 mg | ORAL_TABLET | Freq: Two times a day (BID) | ORAL | 0 refills | Status: DC

## 2016-03-12 MED ORDER — ATORVASTATIN CALCIUM 20 MG PO TABS: 20.0000 mg | ORAL_TABLET | Freq: Every day | ORAL | 0 refills | Status: DC

## 2016-03-12 MED ORDER — METFORMIN HCL 500 MG PO TABS: 500.0000 mg | ORAL_TABLET | Freq: Two times a day (BID) | ORAL | 0 refills | Status: DC

## 2016-03-12 NOTE — Progress Notes (Signed)
Patient ID: [name] LimLinnies Kohls Jr., male   DOB: Mar 10, 1960, 56 y.o.   MRN: [id number] Pleasant on approach, he was in the day room watching TV with peers, came out to the other vacant room to talk with me; "I had tingling feelings on my fingers, my chest was hurting and feared that I may be having another heart attack, I heard voices, I am homeless, and that's all you need to know. Can I go now?..." Patient was allowed to return at wish to join others in the day room. Review of MAR indicates that the patient is getting Robaxin 1,500 mg 4 times daily; pharmacy confirmed prior to administration; Trazodone 150 mg given at bedtime. Appearance: disheveled, A&Ox3, denied pain, denied AV/H, denied SI/HI now. "If they can help me get to the group home, I will not kill myself when I get out .Marland Kitchen.Marland Kitchen."

## 2016-03-12 NOTE — Progress Notes (Signed)
Pt pleasant and cooperative with care. Med and group compliant. Interacting appropriately with staff and peers. Denies SI, HI, AVH. Endorses depression.  Encouragement and support offered. Medications given as prescribed. Safety checks maintained.  Pt receptive and remains safe on unit with q 15 min checks.
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
## 2016-03-12 NOTE — Progress Notes (Signed)
Recreation Therapy Notes  Date: 02.06.18 Time: 1:00 pm Location: Craft Room  Group Topic: Coping Skills  Goal Area(s) Addresses:  Patient will identify emotions related to their recovery. Patient will identify coping skills to aid in their recovery.  Behavioral Response: Inattentive, Left early  Intervention: Emotion Wheel  Activity: Patients were given a worksheet with eight sections. Patients were instructed to come up with eight emotions their feel during the recovery process. Patients were instructed to write or draw coping skills to cope with each emotion inside the section.  Education: LRT educated patients on healthy coping skills.  Education Outcome: Patient left before LRT educated group.   Clinical Observations/Feedback: Patient did not participate in group activity. Patient was falling asleep. Patient left group at approximately 1:17 pm and did not return to group.  Jacquelynn CreeGreene,Jaaliyah Lucatero M, LRT/CTRS 03/12/2016 1:48 PM
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
## 2016-03-12 NOTE — Plan of Care (Signed)
Problem: Activity: Goal: Sleeping patterns will improve Outcome: Progressing Patient slept for Estimated Hours of 8.30; every 15 minutes safety round maintained, no injury or falls during this shift.    

## 2016-03-12 NOTE — [facility] Group Notes (Signed)
[facility] Group Notes:  (Nursing/MHT/Case Management/Adjunct)  Date:  03/12/2016  Time:  4:05 PM  Type of Therapy:  Psychoeducational Skills  Participation Level:  Did Not Attend  Baillie Mohammad Travis Reianna Batdorf 03/12/2016, 4:05 PM 

## 2016-03-12 NOTE — Plan of Care (Signed)
Problem: Education: Goal: Mental status will improve Outcome: Progressing Pt noted smiling more and socializing with peers in milieu

## 2016-03-12 NOTE — [facility] Group Notes (Signed)
Goals Group Date/Time: 03/12/2016 9:00 AM Type of Therapy and Topic: Group Therapy: Goals Group: SMART Goals   Participation Level: Pt did not attend group.  Description of Group:    The purpose of a daily goals group is to assist and guide patients in setting recovery/wellness-related goals. The objective is to set goals as they relate to the crisis in which they were admitted. Patients will be using SMART goal modalities to set measurable goals. Characteristics of realistic goals will be discussed and patients will be assisted in setting and processing how one will reach their goal. Facilitator will also assist patients in applying interventions and coping skills learned in psycho-education groups to the SMART goal and process how one will achieve defined goal.   Therapeutic Goals:   -Patients will develop and document one goal related to or their crisis in which brought them into treatment.  -Patients will be guided by LCSW using SMART goal setting modality in how to set a measurable, attainable, realistic and time sensitive goal.  -Patients will process barriers in reaching goal.  -Patients will process interventions in how to overcome and successful in reaching goal.   Patient's Goal:   Therapeutic Modalities:  Motivational Interviewing  Cognitive Behavioral Therapy  Crisis Intervention Model  SMART goals setting   Greg Josephine Rudnick, LCSW   

## 2016-03-12 NOTE — [facility] Group Notes (Signed)
[facility] LCSW Group Therapy Note  Date/Time: 03/12/16 0930  Type of Therapy/Topic:  Group Therapy:  Feelings about Diagnosis  Participation Level:  Did Not Attend   Mood:   Description of Group:    This group will allow patients to explore their thoughts and feelings about diagnoses they have received. Patients will be guided to explore their level of understanding and acceptance of these diagnoses. Facilitator will encourage patients to process their thoughts and feelings about the reactions of others to their diagnosis, and will guide patients in identifying ways to discuss their diagnosis with significant others in their lives. This group will be process-oriented, with patients participating in exploration of their own experiences as well as giving and receiving support and challenge from other group members.   Therapeutic Goals: 1. Patient will demonstrate understanding of diagnosis as evidence by identifying two or more symptoms of the disorder:  2. Patient will be able to express two feelings regarding the diagnosis 3. Patient will demonstrate ability to communicate their needs through discussion and/or role plays  Summary of Patient Progress:        Therapeutic Modalities:   Cognitive Behavioral Therapy Brief Therapy Feelings Identification   Greg Brady Schiller, LCSW 

## 2016-03-12 NOTE — Progress Notes (Signed)
Recreation Therapy Notes  INPATIENT RECREATION THERAPY ASSESSMENT  Patient Details Name: [name] LimLinnies Goulart Jr. MRN: [id number] DOB: 1960/03/14 Today's Date: 03/12/2016  Patient Stressors: Family, Friends, Other (Comment) (Family is stressful; lack of supportive friends; living condition)  Coping Skills:   Isolate, Substance Abuse, Avoidance, Exercise, Art/Dance, Talking, Music, Sports, Other (Comment) [employer](Church)  Personal Challenges: Concentration, Self-Esteem/Confidence, Substance Abuse, Trusting Others  Leisure Interests (2+):  Individual - Other (Comment) (Go to church, go to the park)  Awareness of Community Resources:  Yes  Community Resources:  Park, [employer]  Current Use: Yes  If no, Barriers?:    Patient Strengths:  Attitude, physical condition  Patient Identified Areas of Improvement:  Get rid of his cold  Current Recreation Participation:  Going to church  Patient Goal for Hospitalization:  To get better  [place]  County [place] Residence:  [place]   Current SI (including self-harm):  Yes  Current HI:  No  Consent to Intern Participation: N/A   Jacquelynn CreeGreene,Meryle Pugmire M, LRT/CTRS 03/12/2016, 3:42 PM

## 2016-03-13 LAB — HEMOGLOBIN A1C
Hgb A1c MFr Bld: 8.2 % — ABNORMAL HIGH (ref 4.8–5.6)
MEAN PLASMA GLUCOSE: 189 mg/dL

## 2016-03-13 LAB — GLUCOSE, CAPILLARY
Glucose-Capillary: 200 mg/dL — ABNORMAL HIGH (ref 65–99)
Glucose-Capillary: 232 mg/dL — ABNORMAL HIGH (ref 65–99)

## 2016-03-13 NOTE — Tx Team (Signed)
Interdisciplinary Treatment and Diagnostic Plan Update  03/13/2016 Time of Session: 10:30 AM [name] [name]. MRN: [id number]  Principal Diagnosis: Major depressive disorder, recurrent severe without psychotic features (HCC)  Secondary Diagnoses: Principal Problem:   Major depressive disorder, recurrent severe without psychotic features (HCC) Active Problems:   Essential hypertension, malignant   DM (diabetes mellitus), type 2, uncontrolled (HCC)   Aortic stenosis, mild   Obesity (BMI 30-39.9)   LVH (left ventricular hypertrophy)   Cerebral infarction (HCC)   CAD (coronary artery disease)   Tobacco use disorder   Stimulant use disorder (HCC) crack cocaine   Alcohol use disorder, moderate, dependence (HCC)   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 acetaminophen (TYLENOL) tablet 650 mg  650 mg Oral Q6H PRN Audery Amel, MD   650 mg at 03/12/16 2142  . alum & mag hydroxide-simeth (MAALOX/MYLANTA) 200-200-20 MG/5ML suspension 30 mL  30 mL Oral Q4H PRN Audery Amel, MD      . amLODipine (NORVASC) tablet 10 mg  10 mg Oral Daily Audery Amel, MD   10 mg at 03/13/16 0834  . aspirin EC tablet 81 mg  81 mg Oral Daily Audery Amel, MD   81 mg at 03/13/16 4098  . atorvastatin (LIPITOR) tablet 20 mg  20 mg Oral q1800 Audery Amel, MD   20 mg at 03/12/16 1611  . chlorthalidone (HYGROTON) tablet 25 mg  25 mg Oral Daily Audery Amel, MD   25 mg at 03/13/16 1191  . cloNIDine (CATAPRES) tablet 0.2 mg  0.2 mg Oral BID Audery Amel, MD   0.2 mg at 03/13/16 0834  . clopidogrel (PLAVIX) tablet 75 mg  75 mg Oral Daily Audery Amel, MD   75 mg at 03/13/16 4782  . cyanocobalamin ((VITAMIN B-12)) injection 1,000 mcg  1,000 mcg Subcutaneous Daily Jimmy Footman, MD   1,000 mcg at 03/13/16 0834  . guaiFENesin (MUCINEX) 12 hr tablet  1,200 mg  1,200 mg Oral BID Jimmy Footman, MD   1,200 mg at 03/13/16 0834  . insulin aspart (novoLOG) injection 0-5 Units  0-5 Units Subcutaneous QHS Jimmy Footman, MD      . insulin aspart (novoLOG) injection 0-9 Units  0-9 Units Subcutaneous TID WC Jimmy Footman, MD   2 Units at 03/13/16 [phone]  . insulin aspart (novoLOG) injection 10 Units  10 Units Subcutaneous BID WC Audery Amel, MD   10 Units at 03/13/16 [phone]  . lisinopril (PRINIVIL,ZESTRIL) tablet 40 mg  40 mg Oral Daily Audery Amel, MD   40 mg at 03/13/16 6578  . loratadine (CLARITIN) tablet 10 mg  10 mg Oral Daily Jimmy Footman, MD   10 mg at 03/13/16 4696  . magnesium hydroxide (MILK OF MAGNESIA) suspension 30 mL  30 mL Oral Daily PRN Audery Amel, MD      . meloxicam (MOBIC) tablet 7.5 mg  7.5 mg Oral Daily Audery Amel, MD   7.5 mg at 03/13/16 2952  . metFORMIN (GLUCOPHAGE) tablet 500 mg  500 mg Oral BID WC Audery Amel, MD   500 mg at 03/13/16 0834  . methocarbamol (ROBAXIN) tablet 750 mg  750 mg Oral QID Jimmy Footman, MD   750 mg at 03/13/16 0834  . nitroGLYCERIN (NITROSTAT) SL tablet 0.4 mg  0.4 mg Sublingual Q5 min PRN Audery Amel, MD      .  pantoprazole (PROTONIX) EC tablet 40 mg  40 mg Oral Daily Audery AmelJohn T Clapacs, MD   40 mg at 03/13/16 [phone]  . sertraline (ZOLOFT) tablet 50 mg  50 mg Oral Daily Audery AmelJohn T Clapacs, MD   50 mg at 03/13/16 [id number]  . traZODone (DESYREL) tablet 150 mg  150 mg Oral QHS Audery AmelJohn T Clapacs, MD   150 mg at 03/12/16 2141  . tuberculin injection 5 Units  5 Units Intradermal Once Jimmy FootmanAndrea Hernandez-Gonzalez, MD   5 Units at 03/11/16 1703   PTA Medications: Prescriptions Prior to Admission  Medication Sig Dispense Refill Last Dose  . amLODipine (NORVASC) 10 MG tablet Take 1 tablet (10 mg total) by mouth daily. 30 tablet 6 03/10/2016 at am  . aspirin EC 81 MG EC tablet Take 1 tablet (81 mg total) by mouth daily. 30 tablet 0 Past Week at Unknown time   . atorvastatin (LIPITOR) 20 MG tablet Take 1 tablet (20 mg total) by mouth daily at 6 PM. 30 tablet 6 03/10/2016 at am  . chlorthalidone (HYGROTON) 25 MG tablet Take 1 tablet (25 mg total) by mouth daily. 30 tablet 0 03/10/2016 at am  . cloNIDine (CATAPRES) 0.2 MG tablet Take 1 tablet (0.2 mg total) by mouth 2 (two) times daily. 60 tablet 6 03/10/2016 at am  . clopidogrel (PLAVIX) 75 MG tablet Take 1 tablet (75 mg total) by mouth daily. 30 tablet 6 03/10/2016 at am  . HYDROcodone-acetaminophen (NORCO/VICODIN) 5-325 MG tablet Take 1 tablet by mouth every 4 (four) hours as needed. (Patient not taking: Reported on 03/11/2016) 12 tablet 0 Completed Course at Unknown time  . insulin aspart (NOVOLOG) 100 UNIT/ML injection Inject 10 Units into the skin 2 (two) times daily with a meal. (Patient taking differently: Inject 56 Units into the skin daily. ) 30 mL 0 Past Month at Unknown time  . lisinopril (PRINIVIL,ZESTRIL) 40 MG tablet Take 1 tablet (40 mg total) by mouth daily. (Patient not taking: Reported on 03/10/2016) 30 tablet 6 Not Taking at Unknown time  . meloxicam (MOBIC) 7.5 MG tablet Take 1 tablet (7.5 mg total) by mouth daily. (Patient not taking: Reported on 03/11/2016) 7 tablet 0 Completed Course at Unknown time  . metFORMIN (GLUCOPHAGE) 500 MG tablet Take 1 tablet (500 mg total) by mouth 2 (two) times daily before a meal. 60 tablet 0 Past Month at Unknown time  . methocarbamol (ROBAXIN-750) 750 MG tablet Take 2 tablets (1,500 mg total) by mouth 4 (four) times daily. (Patient not taking: Reported on 03/11/2016) 24 tablet 0 Completed Course at Unknown time  . naproxen (NAPROSYN) 500 MG tablet Take 1 tablet (500 mg total) by mouth 2 (two) times daily with a meal. 20 tablet 2 PRN at PRN  . nitroGLYCERIN (NITROSTAT) 0.4 MG SL tablet Place 1 tablet (0.4 mg total) under the tongue every 5 (five) minutes as needed for chest pain. 30 tablet 12 PRN at PRN  . pantoprazole (PROTONIX) 40 MG tablet Take 1 tablet (40 mg total)  by mouth daily. 30 tablet 0 Past Month at Unknown time  . sertraline (ZOLOFT) 100 MG tablet Take 0.5 tablets (50 mg total) by mouth daily. (Patient not taking: Reported on 03/10/2016) 30 tablet 0 Not Taking at Unknown time  . tadalafil (CIALIS) 20 MG tablet Take 1 tablet (20 mg total) by mouth daily as needed for erectile dysfunction. 3 tablet 5 PRN at PRN  . traZODone (DESYREL) 150 MG tablet Take 1 tablet (150 mg total) by mouth at bedtime.  30 tablet 0 03/09/2016 at pm     Patient Stressors: Health problems Medication change or noncompliance Substance abuse  Patient Strengths: Ability for insight Average or above average intelligence Communication skills  Treatment Modalities: Medication Management, Group therapy, Case management,  1 to 1 session with clinician, Psychoeducation, Recreational therapy.   Physician Treatment Plan for Primary Diagnosis: Major depressive disorder, recurrent severe without psychotic features (HCC) Long Term Goal(s): Improvement in symptoms so as ready for discharge Improvement in symptoms so as ready for discharge   Short Term Goals: Ability to identify changes in lifestyle to reduce recurrence of condition will improve Ability to verbalize feelings will improve Ability to disclose and discuss suicidal ideas Ability to demonstrate self-control will improve Ability to identify and develop effective coping behaviors will improve Compliance with prescribed medications will improve Ability to identify triggers associated with substance abuse/mental health issues will improve Ability to identify changes in lifestyle to reduce recurrence of condition will improve Ability to demonstrate self-control will improve Compliance with prescribed medications will improve Ability to identify triggers associated with substance abuse/mental health issues will improve  Medication Management: Evaluate patient's response, side effects, and tolerance of medication  regimen.  Therapeutic Interventions: 1 to 1 sessions, Unit Group sessions and Medication administration.  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  Long Term Goal(s): Improvement in symptoms so as ready for discharge Improvement in symptoms so as ready for discharge   Short Term Goals: Ability to identify changes in lifestyle to reduce recurrence of condition will improve Ability to verbalize feelings will improve Ability to disclose and discuss suicidal ideas Ability to demonstrate self-control will improve Ability to identify and develop effective coping behaviors will improve Compliance with prescribed medications will improve Ability to identify triggers associated with substance abuse/mental health issues will improve Ability to identify changes in lifestyle to reduce recurrence of condition will improve Ability to demonstrate self-control will improve Compliance with prescribed medications will improve Ability to identify triggers associated with substance abuse/mental health issues will improve     Medication Management: Evaluate patient's response, side effects, and tolerance of medication regimen.  Therapeutic Interventions: 1 to 1 sessions, Unit Group sessions and Medication administration.  Evaluation of Outcomes: Progressing   RN Treatment Plan for Primary Diagnosis: Major depressive disorder,  recurrent severe without psychotic features (HCC) Long Term Goal(s): Knowledge of disease and therapeutic regimen to maintain health will improve  Short Term Goals: Ability to remain free from injury will improve, Ability to verbalize frustration and anger appropriately will improve, Ability to demonstrate self-control and Ability to disclose and discuss suicidal ideas  Medication Management: RN will administer medications as ordered by provider, will assess and evaluate patient's response and provide education to patient for prescribed medication. RN will report any adverse and/or side effects to prescribing provider.  Therapeutic Interventions: 1 on 1 counseling sessions, Psychoeducation, Medication administration, Evaluate responses to treatment, Monitor vital signs and CBGs as ordered, Perform/monitor CIWA, COWS, AIMS and Fall  Risk screenings as ordered, Perform wound care treatments as ordered.  Evaluation of Outcomes: Progressing   LCSW Treatment Plan for Primary Diagnosis: Major depressive disorder, recurrent severe without psychotic features (HCC) Long Term Goal(s): Safe transition to appropriate next level of care at discharge, Engage patient in therapeutic group addressing interpersonal concerns.  Short Term Goals: Engage patient in aftercare planning with referrals and resources, Increase social support, Facilitate acceptance of mental health diagnosis and concerns and Identify triggers associated with mental health/substance abuse issues  Therapeutic Interventions: Assess for all discharge needs, 1 to 1 time with Social worker, Explore available resources and support systems, Assess for adequacy in community support network, Educate family and significant other(s) on suicide prevention, Complete Psychosocial Assessment, Interpersonal group therapy.  Evaluation of Outcomes: Progressing   Progress in Treatment: Attending groups: Yes. Participating in groups: Yes. Taking medication  as prescribed: Yes. Toleration medication: Yes. Family/Significant other contact made: No, will contact:  Pt refused family contact. Patient understands diagnosis: Yes. Discussing patient identified problems/goals with staff: Yes. Medical problems stabilized or resolved: Yes. Denies suicidal/homicidal ideation: Yes. Issues/concerns per patient self-inventory: No.  New problem(s) identified: No, Describe:  None identified.  Discharge Plan or Barriers: Group home and [facility].  Reason for Continuation of Hospitalization: Delusions  Suicidal ideation  Estimated Length of Stay: D/C 03/13/2016  Attendees: Patient: Rip Hawes 03/13/2016 10:55 AM  Physician: Dr. Radene Journey, MD 03/13/2016 10:55 AM  Nursing: Elenore Paddy, RN 03/13/2016 10:55 AM  RN Care Manager: 03/13/2016 10:55 AM  Social Worker: Hampton Abbot, MSW, LCSW-A 03/13/2016 10:55 AM  Recreational Therapist: Hershal Coria, LRT, CTRS  03/13/2016 10:55 AM   Scribe for Treatment Team: Lynden Oxford, LCSWA 03/13/2016 10:55 AM

## 2016-03-13 NOTE — Plan of Care (Signed)
Problem: [facility] Participation in Recreation Therapeutic Interventions Goal: STG-Patient will demonstrate improved self esteem by identif STG: Self-Esteem - Within 4 treatment sessions, patient will verbalize at least 5 positive affirmation statements in each of 2 treatment sessions to increase self-esteem.  Outcome: Not Applicable Date Met: 37/16/96 Patient refused treatment session.  Leonette Monarch, LRT/CTRS 02.07.18 3:30 pm Goal: STG-Patient will identify at least five coping skills for ** STG: Coping Skills - Within 4 treatment sessions, patient will verbalize at least 5 coping skills for substance abuse in each of 2 treatment sessions to decrease substance abuse.  Outcome: Not Applicable Date Met: 78/93/81 Patient refused treatment session.  Leonette Monarch, LRT/CTRS  02.07.18 3:30 pm

## 2016-03-13 NOTE — Progress Notes (Signed)
Patient ID: [name] LimLinnies Meneely Jr., male   DOB: 01/15/1961, 56 y.o.   MRN: [id number]  Appearance and skin still poorly maintained; medication adjustment discussed, questions encouraged; A&Ox3, denies SI/HI, denies AV/H, anticipating discharge in the morning.

## 2016-03-13 NOTE — [facility] Group Notes (Signed)
[facility] Group Notes:  (Nursing/MHT/Case Management/Adjunct)  Date:  03/13/2016  Time:  6:59 AM  Type of Therapy:  Psychoeducational Skills  Participation Level:  Active  Participation Quality:  Appropriate  Affect:  Appropriate  Cognitive:  Alert  Insight:  Good  Engagement in Group:  Engaged  Modes of Intervention:  Activity  Summary of Progress/Problems:  [name] [name] 03/13/2016, 6:59 AM

## 2016-03-13 NOTE — [facility] Group Notes (Signed)
[facility] LCSW Group Therapy  03/13/2016 10:22 AM  Type of Therapy:  Group Therapy  Participation Level:  Active  Participation Quality:  Attentive  Affect:  Appropriate  Cognitive:  Appropriate  Insight:  Improving  Engagement in Therapy:  Improving  Modes of Intervention:  Discussion, Education, Problem-solving, Reality Testing and Support  Summary of Progress/Problems: Emotional Regulation: Patients will identify both negative and positive emotions. They will discuss emotions they have difficulty regulating and how they impact their lives. Patients will be asked to identify healthy coping skills to combat unhealthy reactions to negative emotions. Patient discussed being hopeful about his discharge because he will be transitioning to a family care home. Patient feels the stability and structure of the family care home will help with his depression. CSW offered support to patient and discussed stress management tips.   Jakiah Bienaime G. Garnette CzechSampson MSW, LCSWA 03/13/2016, 10:25 AM

## 2016-03-13 NOTE — Progress Notes (Signed)
Recreation Therapy Notes  INPATIENT RECREATION TR PLAN  Patient Details Name: [name] [name]. MRN: [id number] DOB: 26-Dec-1960 Today's Date: 03/13/2016  Rec Therapy Plan Is patient appropriate for Therapeutic Recreation?: Yes Treatment times per week: At least once a week TR Treatment/Interventions: 1:1 session, Group participation (Comment) (Appropriate participation in daily recreational therapy tx)  Discharge Criteria Pt will be discharged from therapy if:: Treatment goals are met, Discharged Treatment plan/goals/alternatives discussed and agreed upon by:: Patient/family  Discharge Summary Short term goals set: See Care Plan Short term goals met: Not met Reason goals not met: Patient refused treatment session Therapeutic equipment acquired: N/A Reason patient discharged from therapy: Discharge from hospital Date patient discharged from therapy: 03/13/16   Leonette Monarch, LRT/CTRS 03/13/2016, 3:32 PM

## 2016-03-13 NOTE — Progress Notes (Signed)
  [facility] Discharge [facility] :  Will you be returning to the same living situation after discharge:  No. At discharge, do you have transportation home?: Yes,  group home director will pick pt up. Do you have the ability to pay for your medications: Yes,  Medicaid.  Release of information consent forms completed and in the chart;  Patient's signature needed at discharge.  Patient to Follow up at: Follow-up Information    Presley RaddleAdrian Mancheno, MD. Go on 03/19/2016.   Specialty:  Family Medicine Why:   Your appointment is at 11:00 am. Please bring your insurance card and updated medication list. Contact information: [address] [place] [address] [phone]        [facility]. Go on 03/14/2016.   Why:  [facility] Feb[facility] at 12:30 AM. Bring discharge paperwork to this appointment. If you have questions or concerns, contact [facility]. Contact information: Address: [address] [place], [place] [address] Phone: [phone] Fax: [phone]          Next level of care provider has access to [facility] Link:no  Safety Planning and Suicide Prevention discussed: Yes,  SPE completed with patient.  Have you used any form of tobacco in the last 30 days? (Cigarettes, Smokeless Tobacco, Cigars, and/or Pipes): Yes  Has patient been referred to the Quitline?: Patient refused referral  Patient has been referred for addiction treatment: Pt. refused referral  Lynden OxfordKadijah R Ricahrd Schwager, MSW, LCSW-A 03/13/2016, 10:00 AM

## 2016-03-13 NOTE — Plan of Care (Signed)
Problem: Activity: Goal: Sleeping patterns will improve Outcome: Progressing Patient slept for Estimated Hours of 7; every 15 minutes safety round maintained, no injury or falls during this shift.    

## 2016-03-13 NOTE — Progress Notes (Signed)
D: Patient is aware of  Discharge this shift .Patient denies suicidal /homicidal ideations. Patient received all belongings brought in  A: No Storage medications. Writer reviewed Discharge Summary, Suicide Risk Assessment, and Transitional Record. Patient also received Prescriptions   from  MD.   Writer instructed on discharge criteria  . Informed of 7 day supply  Of medication  FL2 and prescriptions  given to group home staff . Aware  Of follow up appointment . R: Patient left unit with no questions  Or concerns  With group home staff.

## 2016-04-16 ENCOUNTER — Encounter: Payer: Self-pay | Admitting: Emergency Medicine

## 2016-04-16 ENCOUNTER — Emergency Department
Admission: EM | Admit: 2016-04-16 | Discharge: 2016-04-17 | Disposition: A | Discharge status: Home / Self Care | Payer: Medicaid Other | Attending: Emergency Medicine | Admitting: Emergency Medicine

## 2016-04-16 DIAGNOSIS — Z87891 Personal history of nicotine dependence: Secondary | ICD-10-CM | POA: Diagnosis not present

## 2016-04-16 DIAGNOSIS — F329 Major depressive disorder, single episode, unspecified: Secondary | ICD-10-CM | POA: Diagnosis not present

## 2016-04-16 DIAGNOSIS — I2510 Atherosclerotic heart disease of native coronary artery without angina pectoris: Secondary | ICD-10-CM | POA: Diagnosis present

## 2016-04-16 DIAGNOSIS — F332 Major depressive disorder, recurrent severe without psychotic features: Secondary | ICD-10-CM

## 2016-04-16 DIAGNOSIS — E119 Type 2 diabetes mellitus without complications: Secondary | ICD-10-CM | POA: Insufficient documentation

## 2016-04-16 DIAGNOSIS — IMO0002 Reserved for concepts with insufficient information to code with codable children: Secondary | ICD-10-CM | POA: Diagnosis present

## 2016-04-16 DIAGNOSIS — I10 Essential (primary) hypertension: Secondary | ICD-10-CM | POA: Diagnosis present

## 2016-04-16 DIAGNOSIS — E1165 Type 2 diabetes mellitus with hyperglycemia: Secondary | ICD-10-CM | POA: Diagnosis present

## 2016-04-16 DIAGNOSIS — I350 Nonrheumatic aortic (valve) stenosis: Secondary | ICD-10-CM

## 2016-04-16 DIAGNOSIS — Z5181 Encounter for therapeutic drug level monitoring: Secondary | ICD-10-CM | POA: Diagnosis not present

## 2016-04-16 DIAGNOSIS — I639 Cerebral infarction, unspecified: Secondary | ICD-10-CM | POA: Diagnosis present

## 2016-04-16 DIAGNOSIS — R45851 Suicidal ideations: Secondary | ICD-10-CM

## 2016-04-16 DIAGNOSIS — F0670 Mild neurocognitive disorder due to known physiological condition without behavioral disturbance: Secondary | ICD-10-CM | POA: Diagnosis present

## 2016-04-16 DIAGNOSIS — G3184 Mild cognitive impairment, so stated: Secondary | ICD-10-CM | POA: Diagnosis present

## 2016-04-16 LAB — URINE DRUG SCREEN, QUALITATIVE (ARMC ONLY)
AMPHETAMINES, UR SCREEN: NOT DETECTED
Barbiturates, Ur Screen: NOT DETECTED
Benzodiazepine, Ur Scrn: NOT DETECTED
Cannabinoid 50 Ng, Ur ~~LOC~~: POSITIVE — AB
Cocaine Metabolite,Ur ~~LOC~~: POSITIVE — AB
MDMA (ECSTASY) UR SCREEN: NOT DETECTED
METHADONE SCREEN, URINE: NOT DETECTED
Opiate, Ur Screen: NOT DETECTED
PHENCYCLIDINE (PCP) UR S: NOT DETECTED
Tricyclic, Ur Screen: NOT DETECTED

## 2016-04-16 LAB — CBC
HCT: 48.9 % (ref 40.0–52.0)
Hemoglobin: 16.8 g/dL (ref 13.0–18.0)
MCH: 31.5 pg (ref 26.0–34.0)
MCHC: 34.4 g/dL (ref 32.0–36.0)
MCV: 91.7 fL (ref 80.0–100.0)
Platelets: 266 10*3/uL (ref 150–440)
RBC: 5.34 MIL/uL (ref 4.40–5.90)
RDW: 13.1 % (ref 11.5–14.5)
WBC: 13.6 10*3/uL — AB (ref 3.8–10.6)

## 2016-04-16 LAB — COMPREHENSIVE METABOLIC PANEL
ALBUMIN: 4.7 g/dL (ref 3.5–5.0)
ALT: 21 U/L (ref 17–63)
AST: 33 U/L (ref 15–41)
Alkaline Phosphatase: 65 U/L (ref 38–126)
Anion gap: 9 (ref 5–15)
BUN: 22 mg/dL — AB (ref 6–20)
CHLORIDE: 101 mmol/L (ref 101–111)
CO2: 27 mmol/L (ref 22–32)
Calcium: 9.4 mg/dL (ref 8.9–10.3)
Creatinine, Ser: 1.34 mg/dL — ABNORMAL HIGH (ref 0.61–1.24)
GFR calc Af Amer: >60 mL/min (ref >60)
GFR calc non Af Amer: 58 mL/min — ABNORMAL LOW (ref >60)
GLUCOSE: 189 mg/dL — AB (ref 65–99)
POTASSIUM: 3.7 mmol/L (ref 3.5–5.1)
Sodium: 137 mmol/L (ref 135–145)
Total Bilirubin: 1.1 mg/dL (ref 0.3–1.2)
Total Protein: 8.2 g/dL — ABNORMAL HIGH (ref 6.5–8.1)

## 2016-04-16 LAB — SALICYLATE LEVEL: Salicylate Lvl: <7 mg/dL (ref 2.8–30.0)

## 2016-04-16 LAB — ACETAMINOPHEN LEVEL

## 2016-04-16 LAB — ETHANOL: Alcohol, Ethyl (B): <5 mg/dL (ref <5)

## 2016-04-16 MED ORDER — CHLORTHALIDONE 25 MG PO TABS
25.0000 mg | ORAL_TABLET | Freq: Every day | ORAL | Status: DC
  Filled 2016-04-16: qty 1

## 2016-04-16 MED ORDER — ATORVASTATIN CALCIUM 20 MG PO TABS
20.0000 mg | ORAL_TABLET | Freq: Every day | ORAL | Status: DC
  Administered 2016-04-16: 20 mg via ORAL
  Filled 2016-04-16: qty 1

## 2016-04-16 MED ORDER — TRAZODONE HCL 50 MG PO TABS
150.0000 mg | ORAL_TABLET | Freq: Every day | ORAL | Status: DC
  Administered 2016-04-16: 21:00:00 150 mg via ORAL
  Filled 2016-04-16: qty 1

## 2016-04-16 MED ORDER — CLOPIDOGREL BISULFATE 75 MG PO TABS
75.0000 mg | ORAL_TABLET | Freq: Every day | ORAL | Status: DC
  Filled 2016-04-16: qty 1

## 2016-04-16 MED ORDER — PANTOPRAZOLE SODIUM 40 MG PO TBEC
40.0000 mg | DELAYED_RELEASE_TABLET | Freq: Every day | ORAL | Status: DC
  Administered 2016-04-16: 40 mg via ORAL
  Filled 2016-04-16: qty 1

## 2016-04-16 MED ORDER — METFORMIN HCL 500 MG PO TABS: 500.0000 mg | ORAL_TABLET | Freq: Every day | ORAL | Status: DC

## 2016-04-16 MED ORDER — INSULIN ASPART 100 UNIT/ML ~~LOC~~ SOLN
10.0000 [IU] | Freq: Three times a day (TID) | SUBCUTANEOUS | Status: DC
  Filled 2016-04-16: qty 0.1

## 2016-04-16 MED ORDER — LORAZEPAM 1 MG PO TABS
ORAL_TABLET | ORAL | Status: AC
  Administered 2016-04-16: 1 mg via ORAL
  Filled 2016-04-16: qty 1

## 2016-04-16 MED ORDER — CLONIDINE HCL 0.1 MG PO TABS
0.2000 mg | ORAL_TABLET | Freq: Two times a day (BID) | ORAL | Status: DC
  Administered 2016-04-16 (×2): 0.2 mg via ORAL
  Filled 2016-04-16 (×2): qty 2

## 2016-04-16 MED ORDER — ASPIRIN EC 81 MG PO TBEC
81.0000 mg | DELAYED_RELEASE_TABLET | Freq: Every day | ORAL | Status: DC
  Administered 2016-04-16: 81 mg via ORAL
  Filled 2016-04-16: qty 1

## 2016-04-16 MED ORDER — LORAZEPAM 1 MG PO TABS
1.0000 mg | ORAL_TABLET | Freq: Once | ORAL | Status: AC
  Administered 2016-04-16: 1 mg via ORAL

## 2016-04-16 MED ORDER — SERTRALINE HCL 50 MG PO TABS
50.0000 mg | ORAL_TABLET | Freq: Every day | ORAL | Status: DC
  Administered 2016-04-16: 50 mg via ORAL
  Filled 2016-04-16: qty 1

## 2016-04-16 NOTE — ED Notes (Signed)
Pt unsuccessful for urine.   Pt given [place] sandwich tray and water, ok per Dr. Lamont Snowballifenbark.

## 2016-04-16 NOTE — ED Notes (Signed)
Pt ambulatory to toilet by self.  

## 2016-04-16 NOTE — ED Notes (Signed)
Patient transferred to room 2 from the quad, Patient is calm and oriented, [facility].

## 2016-04-16 NOTE — BH Assessment (Signed)
Assessment Note  [name] Michaell Cowingride Jr. is an 56 y.o. male who presents to the ER after his counselor, with [employer]. While there, he voiced SI with plan of shooting his self. He states he have access to a gun. Patient reports of having one suicide attempt in the past. He tried to end his life by ingesting rat poison.  During the interview, the patient was calm, cooperative and pleasant. He was able to answer the questions appropriately. He denies current involvement with the legal system. He denies having history of aggression and violence. He also reports of having no involvement with DSS.   He admits to abusing cocaine. His last time of use was reported to be approximately year ago. He denies the use of mind-altering substances. UDS is pending at the time of this assessment.  Diagnosis: Depression  Past Medical History:  Past Medical History:  Diagnosis Date  . Aortic stenosis, mild February 2015  . Auditory hallucinations   . Bipolar disorder (HCC)   . Coronary artery disease    No reported catheterization. Negative Myoview stress test and 2013.  Marland Kitchen. Depression   . Diabetes mellitus   . GERD (gastroesophageal reflux disease)   . History of suicide attempt   . Hx of medication noncompliance   . Hypertension   . Hypertensive hypertrophic [facility](HCC) February 2015   Severe concentric LVH on echocardiogram.  . Obesity, Class II, BMI 35-39.9, with comorbidity   . Pericarditis   . Polysubstance abuse    cocaine, marijuana  . Stroke [facility](HCC) February 2015   Right MCA aneurysm; head MRI - acute infarct of the right frontal lobe. Smaller acute infarct of the left frontal lobe. Thought to be due to cerebral emboli. Left PICA infarct. Small chronic lacunar infarct right internal capsule. Chronic microvascular ischemic change with white matter change.  . Syncope and collapse     Past Surgical History:  Procedure Laterality Date  . Carotid Dopplers:  03/23/2013   Interval  thickening of bilateral common carotid no significant plaque formation. Tortuosity but no significant stenosis. Patent bilateral vertebral  arteries  . NM MYOVIEW LTD  July 2013   No ischemia or infarct. There is inferior wall defec suggestive of diaphragmatic attenuation. Mild LV dilation at to be due to hypertensive heart disease. EF 55%. Poor exercise tolerance.  Marland Kitchen. PERICARDIAL FLUID DRAINAGE    . TRANSTHORACIC ECHOCARDIOGRAM  03/23/2013   EF 5560%. Elevated left atrial pressure. Severe concentric LVH. Mild aortic stenosis. -- Appearance of hypertensive hypertrophic heart disease.  No clear cardioembolic source    Family History:  Family History  Problem Relation Age of Onset  . Stroke Mother   . Diabetes type II Mother   . Hypertension Mother   . Hypertension Father   . Arthritis Father   . Arthritis Sister   . Diabetes type II Sister   . Hypertension Sister     Social History:  reports that he has quit smoking. His smoking use included Cigars. He has a 20.00 pack-year smoking history. He has never used smokeless tobacco. He reports that he does not drink alcohol or use drugs.  Additional Social History:  Alcohol / Drug Use Pain Medications: See PTA Prescriptions: See PTA Over the Counter: See PTA History of alcohol / drug use?: Yes Longest period of sobriety (when/how long): "Two years Substance #1 Name of Substance 1: Cocaine 1 - Age of First Use: Unable to quantify 1 - Amount (size/oz): Unable to quantify 1 - Frequency: Once  a month 1 - Duration: 04/2016  CIWA: CIWA-Ar BP: (!) 164/117 Pulse Rate: 92 COWS:    Allergies: No Known Allergies  Home Medications:  (Not in a hospital admission)  OB/GYN Status:  No LMP for male patient.  General Assessment Data Location of Assessment: [facility] ED TTS Assessment: In system Is this a Tele or Face-to-Face Assessment?: Face-to-Face Is this an Initial Assessment or a Re-assessment for this encounter?: Initial  Assessment Marital status: Single Maiden name: n/a Is patient pregnant?: No Pregnancy Status: No Living Arrangements: Other relatives Can pt return to current living arrangement?: Yes Admission Status: Involuntary Is patient capable of signing voluntary admission?: No Referral Source: Other (Outpatient Provider) Insurance type: MCD  Medical Screening Exam [facility] Walk-in ONLY) Medical Exam completed: Yes  Crisis Care Plan Living Arrangements: Other relatives Legal Guardian: Other: (Self) Name of Psychiatrist: [facility] Name of Therapist: [facility]  Education Status Is patient currently in school?: No Current Grade: n/a Highest grade of school patient has completed: 9th Grade Name of school: n/a Contact person: n/a  Risk to self with the past 6 months Suicidal Ideation: Yes-Currently Present Has patient been a risk to self within the past 6 months prior to admission? : Yes Suicidal Intent: No Has patient had any suicidal intent within the past 6 months prior to admission? : No Is patient at risk for suicide?: Yes Suicidal Plan?: Yes-Currently Present Has patient had any suicidal plan within the past 6 months prior to admission? : Yes Specify Current Suicidal Plan: Shoot self with gun Access to Means: Yes Specify Access to Suicidal Means: Friend have a gun What has been your use of drugs/alcohol within the last 12 months?: Cocaine Previous Attempts/Gestures: Yes How many times?: 1 Other Self Harm Risks: Active Addiction Triggers for Past Attempts: Other (Comment) (Loss) Intentional Self Injurious Behavior: None Family Suicide History: No Recent stressful life event(s): Conflict (Comment), Loss (Comment), Financial Problems, Turmoil (Comment), Other (Comment) Persecutory voices/beliefs?: No Depression: Yes Depression Symptoms: Isolating, Fatigue, Guilt, Feeling worthless/self pity Substance abuse history and/or  treatment for substance abuse?: Yes Suicide prevention information given to non-admitted patients: Not applicable  Risk to Others within the past 6 months Homicidal Ideation: No Does patient have any lifetime risk of violence toward others beyond the six months prior to admission? : No Thoughts of Harm to Others: No Current Homicidal Intent: No Current Homicidal Plan: No Access to Homicidal Means: No Identified Victim: Reports of none History of harm to others?: No Assessment of Violence: None Noted Violent Behavior Description: Reports of none Does patient have access to weapons?: No Does patient have a court date: No Is patient on probation?: No  Psychosis Hallucinations: Visual Delusions: None noted  Mental Status Report Appearance/Hygiene: Unremarkable, In scrubs Eye Contact: Fair Motor Activity: Freedom of movement, Unremarkable Speech: Logical/coherent Level of Consciousness: Alert Mood: Depressed, Anxious, Helpless, Sad, Pleasant Affect: Appropriate to circumstance, Sad Anxiety Level: Minimal Thought Processes: Coherent, Relevant Judgement: Unimpaired Orientation: Person, Place, Time, Situation, Appropriate for developmental age Obsessive Compulsive Thoughts/Behaviors: Minimal  Cognitive Functioning Concentration: Normal Memory: Recent Intact, Remote Intact IQ: Average Insight: Fair Impulse Control: Fair Appetite: Fair Weight Loss: 0 Weight Gain: 0 Sleep: No Change Total Hours of Sleep: 8 Vegetative Symptoms: None  ADLScreening [facility] Assessment Services) Patient's cognitive ability adequate to safely complete daily activities?: Yes Patient able to express need for assistance with ADLs?: Yes Independently performs ADLs?: Yes (appropriate for developmental age)  Prior Inpatient Therapy Prior  Inpatient Therapy: Yes Prior Therapy Dates: 03/2016 & 03/2015 Prior Therapy Facilty/Provider([facility]  Reason for Treatment: SI  Prior Outpatient Therapy Prior  Outpatient Therapy: Yes Prior Therapy Dates: Current Prior Therapy Facilty/Provider(s): [facility] Reason for Treatment: Depression Does patient have an ACCT team?: No Does patient have Intensive In-House Services?  : No Does patient have [facility] services? : No Does patient have P4CC services?: No  ADL Screening (condition at time of admission) Patient's cognitive ability adequate to safely complete daily activities?: Yes Is the patient deaf or have difficulty hearing?: No Does the patient have difficulty seeing, even when wearing glasses/contacts?: No Does the patient have difficulty concentrating, remembering, or making decisions?: No Patient able to express need for assistance with ADLs?: Yes Does the patient have difficulty dressing or bathing?: No Independently performs ADLs?: Yes (appropriate for developmental age) Does the patient have difficulty walking or climbing stairs?: No Weakness of Legs: None Weakness of Arms/Hands: None  Home Assistive Devices/Equipment Home Assistive Devices/Equipment: None  Therapy Consults (therapy consults require a physician order) PT Evaluation Needed: No OT Evalulation Needed: No SLP Evaluation Needed: No Abuse/Neglect Assessment (Assessment to be complete while patient is alone) Physical Abuse: Denies Verbal Abuse: Denies Sexual Abuse: Denies Exploitation of patient/patient's resources: Denies Self-Neglect: Denies Values / Beliefs Cultural Requests During Hospitalization: None Spiritual Requests During Hospitalization: None Consults Spiritual Care Consult Needed: No Social Work Consult Needed: No Merchant navy officer (For Healthcare) Does Patient Have a Medical Advance Directive?: No    Additional Information 1:1 In Past 12 Months?: No CIRT Risk: No Elopement Risk: No Does patient have medical clearance?: Yes  Child/Adolescent Assessment Running Away Risk: Denies (Patient is an adult)  Disposition:   Disposition Initial Assessment Completed for this Encounter: Yes Disposition of Patient: Other dispositions (Patient is an adult)  On Site Evaluation by:   Reviewed with Physician:    Lilyan Gilford MS, LCAS, LPC, NCC, CCSI Therapeutic Triage Specialist 04/16/2016 5:40 PM

## 2016-04-16 NOTE — ED Notes (Signed)
Dr.Clapacs at bedside  

## 2016-04-16 NOTE — ED Notes (Signed)
Nurse talked with patient and He is alert and oriented, states that he feels better and does not want to die, He is calm and cooperative, took all po medications. No si/hi or avh. q 15 minute checks and camera surveillance in progress.

## 2016-04-16 NOTE — ED Triage Notes (Signed)
Patient presents to ED via POV with SI. Patient states he would shoot himself in the head. Patient states he does have access to firearms. Patient states he has felt this way for 1 month. Patient denies HI. Patient reports seeing "shadows" on and off.

## 2016-04-16 NOTE — ED Notes (Signed)
Pt has 2 warm blankets. Pt is lying on side on stretcher, side rails up.

## 2016-04-16 NOTE — ED Notes (Signed)
Pt states he ate all of his supper tray

## 2016-04-16 NOTE — ED Notes (Signed)
Pt provided supper tray.  

## 2016-04-16 NOTE — ED Provider Notes (Signed)
[facility] Emergency Department Provider Note  ____________________________________________   First MD Initiated Contact with Patient 04/16/16 1353     (approximate)  I have reviewed the triage vital signs and the nursing notes.   HISTORY  Chief Complaint Suicidal    HPI [name] [name]. is a 56 y.o. male who self presents to the emergency department with depression. He is requesting psychiatric evaluation. He has a long-standing history of depression and schizophrenia, however he says he has not been taking his medications for some time. He has been admitted to psychiatric hospitals in the past. He says he is tired of being homeless and his living situation is gotten desperate and that if he does not get help he will take his hand gun and shoot himself in the head. He denies chest pain, shortness of breath, abdominal pain, nausea, vomiting. He has no medical complaints.   Past Medical History:  Diagnosis Date  . Aortic stenosis, mild February 2015  . Auditory hallucinations   . Bipolar disorder (HCC)   . Coronary artery disease    No reported catheterization. Negative Myoview stress test and 2013.  Marland Kitchen Depression   . Diabetes mellitus   . GERD (gastroesophageal reflux disease)   . History of suicide attempt   . Hx of medication noncompliance   . Hypertension   . Hypertensive hypertrophic cardiomyopathy [facility]) February 2015   Severe concentric LVH on echocardiogram.  . Obesity, Class II, BMI 35-39.9, with comorbidity   . Pericarditis   . Polysubstance abuse    cocaine, marijuana  . Stroke [facility]) February 2015   Right MCA aneurysm; head MRI - acute infarct of the right frontal lobe. Smaller acute infarct of the left frontal lobe. Thought to be due to cerebral emboli. Left PICA infarct. Small chronic lacunar infarct right internal capsule. Chronic microvascular ischemic change with white matter change.  . Syncope and collapse     Patient Active  Problem List   Diagnosis Date Noted  . Unspecified Depressive disorder 04/17/2016  . 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 Tobacco use disorder 11/07/2014  . Stimulant use disorder (HCC) crack cocaine 11/07/2014  . Alcohol use disorder, moderate, dependence (HCC) 11/07/2014  . CAD (coronary artery disease) 11/05/2014  . Cerebral infarction (HCC) 10/19/2014  . LVH (left ventricular hypertrophy) 05/20/2013  . Obesity (BMI 30-39.9) 04/10/2013  . Aortic stenosis, mild 03/07/2013  . DM (diabetes mellitus), type 2, uncontrolled (HCC) 01/05/2013  . Essential hypertension, malignant 01/25/2009    Past Surgical History:  Procedure Laterality Date  . Carotid Dopplers:  03/23/2013   Interval thickening of bilateral common carotid no significant plaque formation. Tortuosity but no significant stenosis. Patent bilateral vertebral  arteries  . NM MYOVIEW LTD  July 2013   No ischemia or infarct. There is inferior wall defec suggestive of diaphragmatic attenuation. Mild LV dilation at to be due to hypertensive heart disease. EF 55%. Poor exercise tolerance.  Marland Kitchen PERICARDIAL FLUID DRAINAGE    . TRANSTHORACIC ECHOCARDIOGRAM  03/23/2013   EF 5560%. Elevated left atrial pressure. Severe concentric LVH. Mild aortic stenosis. -- Appearance of hypertensive hypertrophic heart disease.  No clear cardioembolic source    Prior to Admission medications   Medication Sig Start Date End Date Taking? Authorizing Provider  amLODipine (NORVASC) 10 MG tablet Take 1 tablet (10 mg total) by mouth daily. 04/17/16   Jimmy Footman, MD  aspirin 81 MG EC tablet Take 1 tablet (81 mg total) by mouth daily.  04/17/16   Jimmy Footman, MD  atorvastatin (LIPITOR) 20 MG tablet Take 1 tablet (20 mg total) by mouth daily at 6 PM. 04/17/16   Jimmy Footman, MD  chlorthalidone (HYGROTON) 25 MG tablet Take 1 tablet (25 mg total) by mouth daily. 04/17/16   Jimmy Footman, MD  cloNIDine (CATAPRES) 0.2 MG tablet Take 1 tablet (0.2 mg total) by mouth 2 (two) times daily. 04/17/16   Jimmy Footman, MD  clopidogrel (PLAVIX) 75 MG tablet Take 1 tablet (75 mg total) by mouth daily. 04/17/16   Jimmy Footman, MD  insulin aspart (NOVOLOG) 100 UNIT/ML injection Inject 10 Units into the skin 3 (three) times daily with meals. 04/17/16   Jimmy Footman, MD  metFORMIN (GLUCOPHAGE) 500 MG tablet Take 1 tablet (500 mg total) by mouth 2 (two) times daily with a meal. 04/17/16   Jimmy Footman, MD  nitroGLYCERIN (NITROSTAT) 0.4 MG SL tablet Place 1 tablet (0.4 mg total) under the tongue every 5 (five) minutes as needed for chest pain. 04/17/16   Jimmy Footman, MD  pantoprazole (PROTONIX) 40 MG tablet Take 1 tablet (40 mg total) by mouth daily. 04/17/16   Jimmy Footman, MD  sertraline (ZOLOFT) 50 MG tablet Take 1 tablet (50 mg total) by mouth daily. 04/17/16   Jimmy Footman, MD  traZODone (DESYREL) 150 MG tablet Take 1 tablet (150 mg total) by mouth at bedtime. 04/17/16   Jimmy Footman, MD    Allergies Patient has no known allergies.  Family History  Problem Relation Age of Onset  . Stroke Mother   . Diabetes type II Mother   . Hypertension Mother   . Hypertension Father   . Arthritis Father   . Arthritis Sister   . Diabetes type II Sister   . Hypertension Sister     Social History Social History  Substance Use Topics  . Smoking status: Former Smoker    Packs/day: 0.50    Years: 40.00    Types: Cigars  . Smokeless tobacco: Never Used  . Alcohol use No     Comment: occasionally; reports 1 pint of vodka per month at paydaysocially    Review of Systems Constitutional: No fever/chills Eyes: No visual changes. ENT: No sore throat. Cardiovascular: Denies chest pain. Respiratory: Denies shortness of breath. Gastrointestinal: No abdominal pain.  No nausea, no  vomiting.  No diarrhea.  No constipation. Genitourinary: Negative for dysuria. Musculoskeletal: Negative for back pain. Skin: Negative for rash. Neurological: Negative for headaches, focal weakness or numbness. Psychiatric:Depression  10-point ROS otherwise negative.  ____________________________________________   PHYSICAL EXAM:  VITAL SIGNS: ED Triage Vitals [04/16/16 1316]  Enc Vitals Group     BP (!) 164/117     Pulse Rate 92     Resp 17     Temp 98 F (36.7 C)     Temp Source Oral     SpO2 98 %     Weight 230 lb (104.3 kg)     Height 5\' 8"  (1.727 m)     Head Circumference      Peak Flow      Pain Score      Pain Loc      Pain Edu?      Excl. in GC?     Constitutional: Alert and oriented x 4 well appearing nontoxic no diaphoresis speaks in full, clear sentences Eyes: PERRL EOMI. Head: Atraumatic. Nose: No congestion/rhinnorhea. Mouth/Throat: No trismus Neck: No stridor.   Cardiovascular: Normal rate, regular rhythm. Grossly normal heart sounds.  Good peripheral circulation. Respiratory: Normal respiratory effort.  No retractions. Lungs CTAB and moving good air Gastrointestinal: Soft nondistended nontender no rebound no guarding no peritonitis no McBurney's tenderness negative Rovsing's no costovertebral tenderness negative Murphy's Musculoskeletal: No lower extremity edema   Neurologic:  Normal speech and language. No gross focal neurologic deficits are appreciated. Skin:  Skin is warm, dry and intact. No rash noted. Psychiatric: Somewhat flat affect    ____________________________________________   DIFFERENTIAL  Suicidal ideation, major depressive disorder, malingering, substance abuse ____________________________________________   LABS (all labs ordered are listed, but only abnormal results are displayed)  Labs Reviewed  COMPREHENSIVE METABOLIC PANEL - Abnormal; Notable for the following:       Result Value   Glucose, Bld 189 (*)    BUN 22 (*)     Creatinine, Ser 1.34 (*)    Total Protein 8.2 (*)    GFR calc non Af Amer 58 (*)    All other components within normal limits  ACETAMINOPHEN LEVEL - Abnormal; Notable for the following:    Acetaminophen (Tylenol), Serum <10 (*)    All other components within normal limits  CBC - Abnormal; Notable for the following:    WBC 13.6 (*)    All other components within normal limits  URINE DRUG SCREEN, QUALITATIVE ([facility] ONLY) - Abnormal; Notable for the following:    Cocaine Metabolite,Ur [place] POSITIVE (*)    Cannabinoid 50 Ng, Ur  POSITIVE (*)    All other components within normal limits  ETHANOL  SALICYLATE LEVEL    No acute disease creatinine is at baseline __________________________________________  EKG   ____________________________________________  RADIOLOGY   ____________________________________________   PROCEDURES  Procedure(s) performed: no  Procedures  Critical Care performed: no  ____________________________________________   INITIAL IMPRESSION / ASSESSMENT AND PLAN / ED COURSE  Pertinent labs & imaging results that were available during my care of the patient were reviewed by me and considered in my medical decision making (see chart for details).  The patient has no acute medical complaints. He does express concerning suicidal ideation with a clear plan. I have placed him on an involuntary commitment pending psychiatric evaluation.    ____________________________________________   FINAL CLINICAL IMPRESSION(S) / ED DIAGNOSES  Final diagnoses:  Suicidal ideation      NEW MEDICATIONS STARTED DURING THIS VISIT:  Discharge Medication List as of 04/17/2016  1:19 AM       Note:  This document was prepared using Dragon voice recognition software and may include unintentional dictation errors.     Merrily BrittleNeil Gunther Zawadzki, MD 04/17/16 2340

## 2016-04-16 NOTE — ED Notes (Signed)
PT  IVC  SEEN  BY  DR  CLAPACS  PENDING  PLACEMENT 

## 2016-04-16 NOTE — ED Notes (Signed)
Pt states that a police officer brought him today because he has been feeling down recently. When asked about suicidal thoughts, pt denies. When asked about the gun plan he states he had been suicidal because he's been in some transitions and been feeling down. Pt alert and oriented. Lying on stretcher. States hx of schizophrenia and manic depression.

## 2016-04-16 NOTE — ED Notes (Signed)
Dinner was given to patient 

## 2016-04-16 NOTE — Consult Note (Signed)
[facility] Face-to-Face Psychiatry Consult   Reason for Consult:  Consult for 56 year old man with a history of depression and alcohol abuse cognitive impairment and multiple medical problems. Reporting suicidal thoughts. Referring Physician:  Cinda Quest Patient Identification: [name] [name]. MRN:  [id number] Principal Diagnosis: <principal problem not specified> Diagnosis:   Patient Active Problem List   Diagnosis Date Noted  . 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 Major depressive disorder, recurrent severe without psychotic features ([place]) [F33.2] 11/07/2014  . Tobacco use disorder [F17.200] 11/07/2014  . Stimulant use disorder ([place]) crack cocaine [F15.90] 11/07/2014  . Alcohol use disorder, moderate, dependence ([place]) [F10.20] 11/07/2014  . CAD (coronary artery disease) [I25.10] 11/05/2014  . Cerebral infarction ([place]) [I63.9] 10/19/2014  . LVH (left ventricular hypertrophy) [I51.7] 05/20/2013  . Obesity (BMI 30-39.9) [E66.9] 04/10/2013  . Aortic stenosis, mild [I35.0] 03/07/2013  . DM (diabetes mellitus), type 2, uncontrolled ([place]) [E11.65] 01/05/2013  . Essential hypertension, malignant [I10] 01/25/2009    Total Time spent with patient: 1 hour  Subjective:   [name] [name]. is a 56 y.o. male patient admitted with "I told my therapist I was suicidal".  HPI:  Patient interviewed. Chart reviewed. 56 year old man with a history of depression and substance abuse told his regular therapist at [facility] today that he was having suicidal thoughts. He tells me the same thing. Says that he is thinking of overdosing or walking out in front of traffic or shooting himself. He even claims to me that he has access to a gun. Says his mood is down and feeling depressed. Not sleeping very well at night. Can't give any specific reason for wanting to kill himself. Denies auditory hallucinations. Says that he sees "shadows". He tells me he is homeless  apparently having been thrown out of the living situation he was in. He claims he has not used any alcohol or drugs in about a month.  Medical history: Multiple medical problems including malignant hypertension diabetes history of DVT.  Substance abuse history: Long history of alcohol abuse is a primary problem also stimulant abuse especially cocaine in the past.  Social history: He tells me that he had been living in a family care home until yesterday but was thrown out because he was behaving inappropriately. I don't have any other collateral about that.  Past Psychiatric History: Patient has had several prior hospitalizations including here about a month ago. He says he has had 1 suicide attempt many years ago by eating rat poison. He is on Zoloft currently apparently his only real psychiatric medicine. He is seeing people through [employer].  Risk to Self: Is patient at risk for suicide?: Yes Risk to Others:   Prior Inpatient Therapy:   Prior Outpatient Therapy:    Past Medical History:  Past Medical History:  Diagnosis Date  . Aortic stenosis, mild February 2015  . Auditory hallucinations   . Bipolar disorder ([place])   . Coronary artery disease    No reported catheterization. Negative Myoview stress test and 2013.  Marland Kitchen Depression   . Diabetes mellitus   . GERD (gastroesophageal reflux disease)   . History of suicide attempt   . Hx of medication noncompliance   . Hypertension   . Hypertensive hypertrophic cardiomyopathy [facility]) February 2015   Severe concentric LVH on echocardiogram.  . Obesity, Class II, BMI 35-39.9, with comorbidity   . Pericarditis   . Polysubstance abuse    cocaine, marijuana  . Stroke [facility]) February 2015  Right MCA aneurysm; head MRI - acute infarct of the right frontal lobe. Smaller acute infarct of the left frontal lobe. Thought to be due to cerebral emboli. Left PICA infarct. Small chronic lacunar infarct right internal capsule. Chronic microvascular  ischemic change with white matter change.  . Syncope and collapse     Past Surgical History:  Procedure Laterality Date  . Carotid Dopplers:  03/23/2013   Interval thickening of bilateral common carotid no significant plaque formation. Tortuosity but no significant stenosis. Patent bilateral vertebral  arteries  . NM MYOVIEW LTD  July 2013   No ischemia or infarct. There is inferior wall defec suggestive of diaphragmatic attenuation. Mild LV dilation at to be due to hypertensive heart disease. EF 55%. Poor exercise tolerance.  Marland Kitchen PERICARDIAL FLUID DRAINAGE    . TRANSTHORACIC ECHOCARDIOGRAM  03/23/2013   EF 5560%. Elevated left atrial pressure. Severe concentric LVH. Mild aortic stenosis. -- Appearance of hypertensive hypertrophic heart disease.  No clear cardioembolic source   Family History:  Family History  Problem Relation Age of Onset  . Stroke Mother   . Diabetes type II Mother   . Hypertension Mother   . Hypertension Father   . Arthritis Father   . Arthritis Sister   . Diabetes type II Sister   . Hypertension Sister    Family Psychiatric  History: Does not know of any family history Social History:  History  Alcohol Use No    Comment: occasionally; reports 1 pint of vodka per month at paydaysocially     History  Drug Use No    Comment: past use; denies current; UDS positive for cocaine    Social History   Social History  . Marital status: Single    Spouse name: N/A  . Number of children: N/A  . Years of education: N/A   Social History Main Topics  . Smoking status: Former Smoker    Packs/day: 0.50    Years: 40.00    Types: Cigars  . Smokeless tobacco: Never Used  . Alcohol use No     Comment: occasionally; reports 1 pint of vodka per month at paydaysocially  . Drug use: No     Comment: past use; denies current; UDS positive for cocaine  . Sexual activity: Yes   Other Topics Concern  . None   Social History Narrative   Single with no children.  No  exercise.   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   Has a history of polysubstance abuse including cocaine. No she she urine toxicology screen in the hospital was positive.  He also current smoker.   He is seen by behavioral health for bipolar disorder.   Additional Social History:    Allergies:  No Known Allergies  Labs:  Results for orders placed or performed during the hospital encounter of 04/16/16 (from the past 48 hour(s))  Comprehensive metabolic panel     Status: Abnormal   Collection Time: 04/16/16  1:31 PM  Result Value Ref Range   Sodium 137 135 - 145 mmol/L   Potassium 3.7 3.5 - 5.1 mmol/L   Chloride 101 101 - 111 mmol/L   CO2 27 22 - 32 mmol/L   Glucose, Bld 189 (H) 65 - 99 mg/dL   BUN 22 (H) 6 - 20 mg/dL   Creatinine, Ser 1.34 (H) 0.61 - 1.24 mg/dL   Calcium 9.4 8.9 - 10.3 mg/dL   Total Protein 8.2 (H) 6.5 - 8.1  g/dL   Albumin 4.7 3.5 - 5.0 g/dL   AST 33 15 - 41 U/L   ALT 21 17 - 63 U/L   Alkaline Phosphatase 65 38 - 126 U/L   Total Bilirubin 1.1 0.3 - 1.2 mg/dL   GFR calc non Af Amer 58 (L) >60 mL/min   GFR calc Af Amer >60 >60 mL/min    Comment: (NOTE) The eGFR has been calculated using the CKD EPI equation. This calculation has not been validated in all clinical situations. eGFR's persistently <60 mL/min signify possible Chronic Kidney Disease.    Anion gap 9 5 - 15  Ethanol     Status: None   Collection Time: 04/16/16  1:31 PM  Result Value Ref Range   Alcohol, Ethyl (B) <5 <5 mg/dL    Comment:        LOWEST DETECTABLE LIMIT FOR SERUM ALCOHOL IS 5 mg/dL FOR MEDICAL PURPOSES ONLY   Salicylate level     Status: None   Collection Time: 04/16/16  1:31 PM  Result Value Ref Range   Salicylate Lvl <2.6 2.8 - 30.0 mg/dL  Acetaminophen level     Status: Abnormal   Collection Time: 04/16/16  1:31 PM  Result Value Ref Range   Acetaminophen (Tylenol), Serum <10 (L) 10 - 30 ug/mL    Comment:        THERAPEUTIC  CONCENTRATIONS VARY SIGNIFICANTLY. A RANGE OF 10-30 ug/mL MAY BE AN EFFECTIVE CONCENTRATION FOR MANY PATIENTS. HOWEVER, SOME ARE BEST TREATED AT CONCENTRATIONS OUTSIDE THIS RANGE. ACETAMINOPHEN CONCENTRATIONS >150 ug/mL AT 4 HOURS AFTER INGESTION AND >50 ug/mL AT 12 HOURS AFTER INGESTION ARE OFTEN ASSOCIATED WITH TOXIC REACTIONS.   cbc     Status: Abnormal   Collection Time: 04/16/16  1:31 PM  Result Value Ref Range   WBC 13.6 (H) 3.8 - 10.6 K/uL   RBC 5.34 4.40 - 5.90 MIL/uL   Hemoglobin 16.8 13.0 - 18.0 g/dL   HCT 48.9 40.0 - 52.0 %   MCV 91.7 80.0 - 100.0 fL   MCH 31.5 26.0 - 34.0 pg   MCHC 34.4 32.0 - 36.0 g/dL   RDW 13.1 11.5 - 14.5 %   Platelets 266 150 - 440 K/uL    Current Facility-Administered Medications  Medication Dose Route Frequency Provider Last Rate Last Dose  . aspirin EC tablet 81 mg  81 mg Oral Daily Gonzella Lex, MD      . atorvastatin (LIPITOR) tablet 20 mg  20 mg Oral q1800 Gonzella Lex, MD      . chlorthalidone (HYGROTON) tablet 25 mg  25 mg Oral Daily Preslei Blakley T Mariel Gaudin, MD      . cloNIDine (CATAPRES) tablet 0.2 mg  0.2 mg Oral BID Gonzella Lex, MD      . clopidogrel (PLAVIX) tablet 75 mg  75 mg Oral Daily Gonzella Lex, MD      . Derrill Memo ON 04/17/2016] insulin aspart (novoLOG) injection 10 Units  10 Units Subcutaneous TID WC Gonzella Lex, MD      . Derrill Memo ON 04/17/2016] metFORMIN (GLUCOPHAGE) tablet 500 mg  500 mg Oral Q breakfast Gonzella Lex, MD      . pantoprazole (PROTONIX) EC tablet 40 mg  40 mg Oral Daily Gonzella Lex, MD      . sertraline (ZOLOFT) tablet 50 mg  50 mg Oral Daily Gonzella Lex, MD      . traZODone (DESYREL) tablet 150 mg  150 mg Oral QHS Ereka Brau  Salley Scarlet, MD       Current Outpatient Prescriptions  Medication Sig Dispense Refill  . aspirin EC 81 MG EC tablet Take 1 tablet (81 mg total) by mouth daily. 30 tablet 0  . cloNIDine (CATAPRES) 0.2 MG tablet Take 1 tablet (0.2 mg total) by mouth 2 (two) times daily. 60 tablet 0   . clopidogrel (PLAVIX) 75 MG tablet Take 1 tablet (75 mg total) by mouth daily. 30 tablet 0  . insulin aspart (NOVOLOG) 100 UNIT/ML injection Inject 10 Units into the skin 2 (two) times daily with a meal. (Patient taking differently: Inject 10 Units into the skin 3 (three) times daily with meals. ) 6 mL 0  . nitroGLYCERIN (NITROSTAT) 0.4 MG SL tablet Place 1 tablet (0.4 mg total) under the tongue every 5 (five) minutes as needed for chest pain. 15 tablet 0  . traZODone (DESYREL) 150 MG tablet Take 1 tablet (150 mg total) by mouth at bedtime. 30 tablet 0  . amLODipine (NORVASC) 10 MG tablet Take 1 tablet (10 mg total) by mouth daily. (Patient not taking: Reported on 04/16/2016) 30 tablet 0  . atorvastatin (LIPITOR) 20 MG tablet Take 1 tablet (20 mg total) by mouth daily at 6 PM. (Patient not taking: Reported on 04/16/2016) 30 tablet 0  . chlorthalidone (HYGROTON) 25 MG tablet Take 1 tablet (25 mg total) by mouth daily. 30 tablet 0  . lisinopril (PRINIVIL,ZESTRIL) 40 MG tablet Take 1 tablet (40 mg total) by mouth daily. (Patient not taking: Reported on 04/16/2016) 30 tablet 0  . meloxicam (MOBIC) 7.5 MG tablet Take 1 tablet (7.5 mg total) by mouth daily. (Patient not taking: Reported on 04/16/2016) 30 tablet 0  . metFORMIN (GLUCOPHAGE) 500 MG tablet Take 1 tablet (500 mg total) by mouth 2 (two) times daily with a meal. 60 tablet 0  . methocarbamol (ROBAXIN) 750 MG tablet Take 1 tablet (750 mg total) by mouth every 6 (six) hours as needed for muscle spasms. (Patient not taking: Reported on 04/16/2016) 90 tablet 0  . pantoprazole (PROTONIX) 40 MG tablet Take 1 tablet (40 mg total) by mouth daily. (Patient not taking: Reported on 04/16/2016) 30 tablet 0  . sertraline (ZOLOFT) 50 MG tablet Take 1 tablet (50 mg total) by mouth daily. (Patient not taking: Reported on 04/16/2016) 30 tablet 0    Musculoskeletal: Strength & Muscle Tone: within normal limits Gait & Station: normal Patient leans: N/A  Psychiatric  Specialty Exam: Physical Exam  Nursing note and vitals reviewed. Constitutional: He appears well-developed and well-nourished.  HENT:  Head: Normocephalic and atraumatic.  Eyes: Conjunctivae are normal. Pupils are equal, round, and reactive to light.  Neck: Normal range of motion.  Cardiovascular: Regular rhythm and normal heart sounds.   Respiratory: Effort normal. No respiratory distress.  GI: Soft.  Musculoskeletal: Normal range of motion.  Neurological: He is alert.  Skin: Skin is warm and dry.  Psychiatric: His mood appears anxious. His speech is delayed. He expresses impulsivity. He exhibits a depressed mood. He expresses suicidal ideation. He expresses suicidal plans. He exhibits abnormal recent memory.    Review of Systems  Constitutional: Negative.   HENT: Negative.   Eyes: Negative.   Respiratory: Negative.   Cardiovascular: Negative.   Gastrointestinal: Negative.   Musculoskeletal: Negative.   Skin: Negative.   Neurological: Negative.   Psychiatric/Behavioral: Positive for depression, hallucinations and suicidal ideas. Negative for memory loss and substance abuse. The patient is nervous/anxious and has insomnia.     Blood pressure Marland Kitchen)  164/117, pulse 92, temperature 98 F (36.7 C), temperature source Oral, resp. rate 17, height _0  (1.727 m), weight 104.3 kg (230 lb), SpO2 98 %.Body mass index is 34.97 kg/m.  General Appearance: Fairly Groomed  Eye Contact:  Good  Speech:  Clear and Coherent  Volume:  Normal  Mood:  Depressed  Affect:  Inappropriate and Full Range  Thought Process:  Goal Directed  Orientation:  Full (Time, Place, and Person)  Thought Content:  Illogical  Suicidal Thoughts:  Yes.  with intent/plan  Homicidal Thoughts:  No  Memory:  Immediate;   Good Recent;   Fair Remote;   Fair  Judgement:  Impaired  Insight:  Shallow  Psychomotor Activity:  Decreased  Concentration:  Concentration: Fair  Recall:  [employer] of Knowledge:  Fair   Language:  Fair  Akathisia:  No  Handed:  Right  AIMS (if indicated):     Assets:  Communication Skills Desire for Improvement Social Support  ADL's:  Intact  Cognition:  Impaired,  Mild  Sleep:        Treatment Plan Summary: Daily contact with patient to assess and evaluate symptoms and progress in treatment, Medication management and Plan Patient is reporting suicidal ideation. He is smiling throughout the interview and does not appear to be particularly distressed. He has been inconsistent history in his reports of suicidality. He is very clear about being homeless which makes me suspect there may be a significant element of secondary gain involved with this. I have ordered all of his medicines again. We will evaluate him overnight and get some input from TTS. If he is continuing to voice suicidal ideation we can consider admission tomorrow when we have more beds available.  Disposition: Supportive therapy provided about ongoing stressors.  Alethia Berthold, MD 04/16/2016 5:31 PM

## 2016-04-17 ENCOUNTER — Inpatient Hospital Stay: Payer: Medicaid Other

## 2016-04-17 ENCOUNTER — Inpatient Hospital Stay
Admit: 2016-04-17 | Discharge: 2016-04-17 | DRG: 885 | Disposition: A | Discharge status: Home / Self Care | Payer: Medicaid Other | Source: Intra-hospital | Attending: Psychiatry | Admitting: Psychiatry

## 2016-04-17 DIAGNOSIS — Z8249 Family history of ischemic heart disease and other diseases of the circulatory system: Secondary | ICD-10-CM

## 2016-04-17 DIAGNOSIS — Z811 Family history of alcohol abuse and dependence: Secondary | ICD-10-CM | POA: Diagnosis not present

## 2016-04-17 DIAGNOSIS — IMO0002 Reserved for concepts with insufficient information to code with codable children: Secondary | ICD-10-CM | POA: Diagnosis present

## 2016-04-17 DIAGNOSIS — G3184 Mild cognitive impairment, so stated: Secondary | ICD-10-CM | POA: Diagnosis present

## 2016-04-17 DIAGNOSIS — I639 Cerebral infarction, unspecified: Secondary | ICD-10-CM | POA: Diagnosis present

## 2016-04-17 DIAGNOSIS — E669 Obesity, unspecified: Secondary | ICD-10-CM | POA: Diagnosis present

## 2016-04-17 DIAGNOSIS — Z823 Family history of stroke: Secondary | ICD-10-CM

## 2016-04-17 DIAGNOSIS — Z8673 Personal history of transient ischemic attack (TIA), and cerebral infarction without residual deficits: Secondary | ICD-10-CM

## 2016-04-17 DIAGNOSIS — Z8261 Family history of arthritis: Secondary | ICD-10-CM | POA: Diagnosis not present

## 2016-04-17 DIAGNOSIS — A150 Tuberculosis of lung: Secondary | ICD-10-CM

## 2016-04-17 DIAGNOSIS — F17210 Nicotine dependence, cigarettes, uncomplicated: Secondary | ICD-10-CM | POA: Diagnosis present

## 2016-04-17 DIAGNOSIS — E785 Hyperlipidemia, unspecified: Secondary | ICD-10-CM | POA: Diagnosis present

## 2016-04-17 DIAGNOSIS — K219 Gastro-esophageal reflux disease without esophagitis: Secondary | ICD-10-CM | POA: Diagnosis present

## 2016-04-17 DIAGNOSIS — I422 Other hypertrophic cardiomyopathy: Secondary | ICD-10-CM | POA: Diagnosis present

## 2016-04-17 DIAGNOSIS — I350 Nonrheumatic aortic (valve) stenosis: Secondary | ICD-10-CM

## 2016-04-17 DIAGNOSIS — F319 Bipolar disorder, unspecified: Secondary | ICD-10-CM | POA: Diagnosis present

## 2016-04-17 DIAGNOSIS — I2510 Atherosclerotic heart disease of native coronary artery without angina pectoris: Secondary | ICD-10-CM | POA: Diagnosis present

## 2016-04-17 DIAGNOSIS — F1020 Alcohol dependence, uncomplicated: Secondary | ICD-10-CM | POA: Diagnosis present

## 2016-04-17 DIAGNOSIS — R45851 Suicidal ideations: Secondary | ICD-10-CM | POA: Diagnosis present

## 2016-04-17 DIAGNOSIS — Z833 Family history of diabetes mellitus: Secondary | ICD-10-CM | POA: Diagnosis not present

## 2016-04-17 DIAGNOSIS — F329 Major depressive disorder, single episode, unspecified: Secondary | ICD-10-CM

## 2016-04-17 DIAGNOSIS — E1165 Type 2 diabetes mellitus with hyperglycemia: Secondary | ICD-10-CM | POA: Diagnosis present

## 2016-04-17 DIAGNOSIS — I10 Essential (primary) hypertension: Secondary | ICD-10-CM | POA: Diagnosis present

## 2016-04-17 DIAGNOSIS — I517 Cardiomegaly: Secondary | ICD-10-CM | POA: Diagnosis present

## 2016-04-17 DIAGNOSIS — Z6835 Body mass index (BMI) 35.0-35.9, adult: Secondary | ICD-10-CM | POA: Diagnosis not present

## 2016-04-17 DIAGNOSIS — Z590 Homelessness: Secondary | ICD-10-CM

## 2016-04-17 DIAGNOSIS — F1590 Other stimulant use, unspecified, uncomplicated: Secondary | ICD-10-CM | POA: Diagnosis present

## 2016-04-17 DIAGNOSIS — F17200 Nicotine dependence, unspecified, uncomplicated: Secondary | ICD-10-CM | POA: Diagnosis present

## 2016-04-17 DIAGNOSIS — F32A Depression, unspecified: Secondary | ICD-10-CM

## 2016-04-17 DIAGNOSIS — F0670 Mild neurocognitive disorder due to known physiological condition without behavioral disturbance: Secondary | ICD-10-CM | POA: Diagnosis present

## 2016-04-17 LAB — GLUCOSE, CAPILLARY
Glucose-Capillary: 219 mg/dL — ABNORMAL HIGH (ref 65–99)
Glucose-Capillary: 182 mg/dL — ABNORMAL HIGH (ref 65–99)

## 2016-04-17 LAB — LIPID PANEL
CHOL/HDL RATIO: 4 ratio
Cholesterol: 92 mg/dL (ref 0–200)
HDL: 23 mg/dL — AB (ref >40)
LDL Cholesterol: 53 mg/dL (ref 0–99)
Triglycerides: 81 mg/dL (ref <150)
VLDL: 16 mg/dL (ref 0–40)

## 2016-04-17 LAB — TSH: TSH: 1.103 u[IU]/mL (ref 0.350–4.500)

## 2016-04-17 MED ORDER — CLONIDINE HCL 0.2 MG PO TABS: 0.2000 mg | ORAL_TABLET | Freq: Two times a day (BID) | ORAL | 0 refills | Status: DC

## 2016-04-17 MED ORDER — ACETAMINOPHEN 325 MG PO TABS: 650.0000 mg | ORAL_TABLET | Freq: Four times a day (QID) | ORAL | Status: DC | PRN

## 2016-04-17 MED ORDER — MAGNESIUM HYDROXIDE 400 MG/5ML PO SUSP: 30.0000 mL | Freq: Every day | ORAL | Status: DC | PRN

## 2016-04-17 MED ORDER — SERTRALINE HCL 25 MG PO TABS
50.0000 mg | ORAL_TABLET | Freq: Every day | ORAL | Status: DC
  Administered 2016-04-17: 50 mg via ORAL
  Filled 2016-04-17: qty 2

## 2016-04-17 MED ORDER — SERTRALINE HCL 50 MG PO TABS: 50.0000 mg | ORAL_TABLET | Freq: Every day | ORAL | 0 refills | Status: DC

## 2016-04-17 MED ORDER — NITROGLYCERIN 0.4 MG SL SUBL: 0.4000 mg | SUBLINGUAL_TABLET | SUBLINGUAL | 0 refills | Status: AC | PRN

## 2016-04-17 MED ORDER — PANTOPRAZOLE SODIUM 40 MG PO TBEC
40.0000 mg | DELAYED_RELEASE_TABLET | Freq: Every day | ORAL | Status: DC
  Administered 2016-04-17: 40 mg via ORAL
  Filled 2016-04-17: qty 1

## 2016-04-17 MED ORDER — CHLORTHALIDONE 25 MG PO TABS: 25.0000 mg | ORAL_TABLET | Freq: Every day | ORAL | 0 refills | Status: AC

## 2016-04-17 MED ORDER — METFORMIN HCL 500 MG PO TABS
500.0000 mg | ORAL_TABLET | Freq: Every day | ORAL | Status: DC
  Administered 2016-04-17: 500 mg via ORAL
  Filled 2016-04-17: qty 1

## 2016-04-17 MED ORDER — PANTOPRAZOLE SODIUM 40 MG PO TBEC: 40.0000 mg | DELAYED_RELEASE_TABLET | Freq: Every day | ORAL | 0 refills | Status: DC

## 2016-04-17 MED ORDER — ASPIRIN EC 81 MG PO TBEC
81.0000 mg | DELAYED_RELEASE_TABLET | Freq: Every day | ORAL | Status: DC
  Administered 2016-04-17: 81 mg via ORAL
  Filled 2016-04-17: qty 1

## 2016-04-17 MED ORDER — AMLODIPINE BESYLATE 10 MG PO TABS: 10.0000 mg | ORAL_TABLET | Freq: Every day | ORAL | 0 refills | Status: AC

## 2016-04-17 MED ORDER — TRAZODONE HCL 150 MG PO TABS: 150.0000 mg | ORAL_TABLET | Freq: Every day | ORAL | 0 refills | Status: AC

## 2016-04-17 MED ORDER — CLONIDINE HCL 0.1 MG PO TABS
0.2000 mg | ORAL_TABLET | Freq: Two times a day (BID) | ORAL | Status: DC
  Administered 2016-04-17 (×2): 0.2 mg via ORAL
  Filled 2016-04-17 (×2): qty 2

## 2016-04-17 MED ORDER — ALUM & MAG HYDROXIDE-SIMETH 200-200-20 MG/5ML PO SUSP: 30.0000 mL | ORAL | Status: DC | PRN

## 2016-04-17 MED ORDER — INSULIN ASPART 100 UNIT/ML ~~LOC~~ SOLN: 10.0000 [IU] | Freq: Three times a day (TID) | SUBCUTANEOUS | 0 refills | Status: DC

## 2016-04-17 MED ORDER — ASPIRIN 81 MG PO TBEC: 81.0000 mg | DELAYED_RELEASE_TABLET | Freq: Every day | ORAL | 0 refills | Status: AC

## 2016-04-17 MED ORDER — ATORVASTATIN CALCIUM 20 MG PO TABS
20.0000 mg | ORAL_TABLET | Freq: Every day | ORAL | Status: DC
  Administered 2016-04-17: 20 mg via ORAL
  Filled 2016-04-17: qty 1

## 2016-04-17 MED ORDER — CLOPIDOGREL BISULFATE 75 MG PO TABS
75.0000 mg | ORAL_TABLET | Freq: Every day | ORAL | Status: DC
  Administered 2016-04-17: 75 mg via ORAL
  Filled 2016-04-17: qty 1

## 2016-04-17 MED ORDER — INSULIN ASPART 100 UNIT/ML ~~LOC~~ SOLN
10.0000 [IU] | Freq: Three times a day (TID) | SUBCUTANEOUS | Status: DC
  Administered 2016-04-17 (×2): 10 [IU] via SUBCUTANEOUS
  Filled 2016-04-17 (×2): qty 10

## 2016-04-17 MED ORDER — CLOPIDOGREL BISULFATE 75 MG PO TABS: 75.0000 mg | ORAL_TABLET | Freq: Every day | ORAL | 0 refills | Status: AC

## 2016-04-17 MED ORDER — CHLORTHALIDONE 25 MG PO TABS
25.0000 mg | ORAL_TABLET | Freq: Every day | ORAL | Status: DC
  Administered 2016-04-17: 25 mg via ORAL
  Filled 2016-04-17: qty 1

## 2016-04-17 MED ORDER — TRAZODONE HCL 50 MG PO TABS: 150.0000 mg | ORAL_TABLET | Freq: Every day | ORAL | Status: DC

## 2016-04-17 MED ORDER — METFORMIN HCL 500 MG PO TABS: 500.0000 mg | ORAL_TABLET | Freq: Two times a day (BID) | ORAL | 0 refills | Status: DC

## 2016-04-17 MED ORDER — ATORVASTATIN CALCIUM 20 MG PO TABS: 20.0000 mg | ORAL_TABLET | Freq: Every day | ORAL | 0 refills | Status: AC

## 2016-04-17 NOTE — Progress Notes (Addendum)
Inpatient Diabetes Program Recommendations  AACE/ADA: New Consensus Statement on Inpatient Glycemic Control (2015)  Target Ranges:  Prepandial:   less than 140 mg/dL      Peak postprandial:   less than 180 mg/dL (1-2 hours)      Critically ill patients:  140 - 180 mg/dL   Review of Glycemic Control  Diabetes history: DM 2 Outpatient Diabetes medications: Novolog 10 units TID with meals, Metformin reported not taking Current orders for Inpatient glycemic control: Metformin 500 mg Daily, Novolog 10 units TID with meals  Inpatient Diabetes Program Recommendations:   Please consider obtaining CBG's ACHS, add Novolog Sensitive Correction (0-9 units). Please also order a Carb modified diet. Consider reducing meal coverage from 10 units ordered to 5 (patient currently refusing anything to eat per EHR)  Current A1c pending, Last A1c was 8.2% on 03/12/16  Thanks,  Christena DeemShannon Rockell Faulks RN, MSN, [facility] Inpatient Diabetes Coordinator Team Pager [phone] (8a-5p)

## 2016-04-17 NOTE — Progress Notes (Signed)
Patient ID: [name] LimLinnies Thole Jr., male   DOB: December 22, 1960, 56 y.o.   MRN: [id number] PT was on unit less than 24 hours.  Unable to provide psychosocial assessment prior to his discharge.  [name], MSW, LCSW 04/17/2016

## 2016-04-17 NOTE — Progress Notes (Signed)
Admission Note:   D:56 yr male who presents IVC in no acute distress for the treatment of SI and Depression. Pt appears flat and depressed. Pt was calm and cooperative with admission process. Pt denies SI/HI/AVH/ pain at this time. Pt stated he was feeling depressed for the past few weeks and has also been non-compliant with his medications during that time. Pt said he left his group home because the staff was mean to him so he is staying with his brother now, but that situation is not good, so he needs another group home or his own place to live.   A:Skin was assessed and found to be clear of any abnormal marks.PT searched and no contraband found, POC and unit policies explained and understanding verbalized. Consents obtained. Food and fluids offered and refused.   R:Pt had no additional questions or concerns.

## 2016-04-17 NOTE — Progress Notes (Signed)
  [facility] :  Will you be returning to the same living situation after discharge:  No.Going to Group home, Phone [phone] At discharge, do you have transportation home?: Yes,    Do you have the ability to pay for your medications: Yes,     Release of information consent forms completed and in the chart;  Patient's signature needed at discharge.  Patient to Follow up at: Follow-up Information    [facility] Go on 04/19/2016.   Why:  2:30pm, For hospital follow up. Contact information: [address] [place] [address] [phone]        Presley RaddleAdrian Mancheno, MD. Schedule an appointment as soon as possible for a visit in 5 day(s).   Specialty:  Family Medicine Contact information: [address] [place] [address] [phone]           Next level of care provider [facility] Link:no  Safety Planning and Suicide Prevention discussed: Yes,     Have you used any form of tobacco in the last 30 days? (Cigarettes, Smokeless Tobacco, Cigars, and/or Pipes): Yes  Has patient been referred to the Quitline?: Patient refused referral  Patient has been referred for addiction treatment: Yes  Glennon MacSara P Kaiven Vester, MSW, LCSW 04/17/2016, 3:59 PM

## 2016-04-17 NOTE — [facility] Suicide Risk Assessment (Signed)
[facility] INPATIENT:  Family/Significant Other Suicide Prevention Education  Suicide Prevention Education:  Patient Refusal for Family/Significant Other Suicide Prevention Education: The patient [name] [name]. has refused to provide written consent for family/significant other to be provided Family/Significant Other Suicide Prevention Education during admission and/or prior to discharge.  Physician notified.  Glennon MacSara P Miqueas Whilden, MSW, LCSW 04/17/2016, 4:02 PM
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
## 2016-04-17 NOTE — [facility] Group Notes (Signed)
  [facility] LCSW Group Therapy Note  Date/Time: 04/17/16, 1300  Type of Therapy/Topic:  Group Therapy:  Emotion Regulation  Participation Level:  Minimal   Mood: sleepy  Description of Group:    The purpose of this group is to assist patients in learning to regulate negative emotions and experience positive emotions. Patients will be guided to discuss ways in which they have been vulnerable to their negative emotions. These vulnerabilities will be juxtaposed with experiences of positive emotions or situations, and patients challenged to use positive emotions to combat negative ones. Special emphasis will be placed on coping with negative emotions in conflict situations, and patients will process healthy conflict resolution skills.  Therapeutic Goals: 1. Patient will identify two positive emotions or experiences to reflect on in order to balance out negative emotions:  2. Patient will label two or more emotions that they find the most difficult to experience:  3. Patient will be able to demonstrate positive conflict resolution skills through discussion or role plays:   Summary of Patient Progress: Pt did not actively participate, however, he did identify anxiety and anger as feelings that were difficult for him to experience.  CSW called on him later to demonstrate conflict resolution process and pt made very appropriate role play statement about how he would speak to someone at work that he was in conflict with.       Therapeutic Modalities:   Cognitive Behavioral Therapy Feelings Identification Dialectical Behavioral Therapy  Daleen SquibbGreg Isaih Bulger, LCSW

## 2016-04-17 NOTE — ED Notes (Signed)
Report called to Casimiro NeedleMichael, RN.  Preparing patient for discharge.
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
## 2016-04-17 NOTE — Progress Notes (Signed)
Chaplain responded to an order which states that patient had a suicidal thought. Pt. Says that he used drugs to get high. He said that he used marijuana and when he smoked it he felt like superman. Pt. Has three children but he is not in relationship with them. Chaplain suggested to patient to seek a professional help for counseling and try to mend his relationship with his children. Patient requested a large-print Bible. Chaplain would provide the Bible if we have one in the closet.

## 2016-04-17 NOTE — Progress Notes (Signed)
Recreation Therapy Notes  Date: 03.14.18 Time: 9:30 am Location: Craft Room  Group Topic: Self-esteem  Goal Area(s) Addresses:  Patient will write at least one positive trait about self. Patient will verbalize benefit of having healthy self-esteem.  Behavioral Response: Did not attend  Intervention: I Am  Activity: Patients were given a worksheet with the letter I on it and were instructed to write as many positive traits about themselves inside the letter.  Education: LRT educated patients on ways they can increase their self-esteem.  Education Outcome: Patient did not attend group.   Clinical Observations/Feedback: Patient did not attend group.  Jacquelynn CreeGreene,Rollyn Scialdone M, LRT/CTRS 04/17/2016 10:08 AM

## 2016-04-17 NOTE — [facility] Suicide Risk Assessment (Signed)
[facility]   Principal Problem: Depressive disorder Discharge Diagnoses:  Patient Active Problem List   Diagnosis Date Noted  . Unspecified Depressive disorder [F32.9] 04/17/2016  . 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 Tobacco use disorder [F17.200] 11/07/2014  . Stimulant use disorder (HCC) crack cocaine [F15.90] 11/07/2014  . Alcohol use disorder, moderate, dependence (HCC) [F10.20] 11/07/2014  . CAD (coronary artery disease) [I25.10] 11/05/2014  . Cerebral infarction (HCC) [I63.9] 10/19/2014  . LVH (left ventricular hypertrophy) [I51.7] 05/20/2013  . Obesity (BMI 30-39.9) [E66.9] 04/10/2013  . Aortic stenosis, mild [I35.0] 03/07/2013  . DM (diabetes mellitus), type 2, uncontrolled (HCC) [E11.65] 01/05/2013  . Essential hypertension, malignant [I10] 01/25/2009      Psychiatric Specialty Exam: ROS  Blood pressure (!) 137/56, pulse 78, temperature 97.6 F (36.4 C), resp. rate 18, height 5\' 11"  (1.803 m), weight 110.2 kg (243 lb), SpO2 98 %.Body mass index is 33.89 kg/m.                                                       Mental Status Per Nursing Assessment::   On Admission:     Demographic Factors:  Male  Loss Factors: Decline in physical health  Historical Factors: Prior suicide attempts and Impulsivity  Risk Reduction Factors:   Living with another person, especially a relative  Continued Clinical Symptoms:  Alcohol/Substance Abuse/Dependencies Previous Psychiatric Diagnoses and Treatments Medical Diagnoses and Treatments/Surgeries  Cognitive Features That Contribute To Risk:  Closed-mindedness    Suicide Risk:  Minimal: No identifiable suicidal ideation.  Patients presenting with no risk factors but with morbid ruminations; may be classified as minimal risk based on the severity of the depressive symptoms    Jimmy FootmanHernandez-Gonzalez,  Carmelia Tiner, MD 04/17/2016, 2:14  PM

## 2016-04-17 NOTE — NC FL2 (Signed)
[place] MEDICAID FL2 LEVEL OF CARE SCREENING TOOL     IDENTIFICATION  Patient Name: [name] [name]. Birthdate: 1961/01/31 Sex: male Admission Date (Current Location): 04/17/2016  [place] and [place] Number:  [address] [id number] [facility] Facility and Address:  [facility], [address], [place], [place] [address]      Provider Number: [id number]  Attending Physician Name and Address:  Barnabas Harries*  Relative Name and Phone Number:       Current Level of Care: Hospital Recommended Level of Care: Family Care Home Prior Approval Number:    Date Approved/Denied:   PASRR Number:    Discharge Plan: Other (Comment)    Current Diagnoses: Patient Active Problem List   Diagnosis Date Noted  . Unspecified Depressive disorder 04/17/2016  . 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 Tobacco use disorder 11/07/2014  . Stimulant use disorder (HCC) crack cocaine 11/07/2014  . Alcohol use disorder, moderate, dependence (HCC) 11/07/2014  . CAD (coronary artery disease) 11/05/2014  . Cerebral infarction (HCC) 10/19/2014  . LVH (left ventricular hypertrophy) 05/20/2013  . Obesity (BMI 30-39.9) 04/10/2013  . Aortic stenosis, mild 03/07/2013  . DM (diabetes mellitus), type 2, uncontrolled (HCC) 01/05/2013  . Essential hypertension, malignant 01/25/2009    Orientation RESPIRATION BLADDER Height & Weight     Self, Time, Situation, Place  Normal Continent Weight: 243 lb (110.2 kg) Height:  5\' 11"  (180.3 cm)  BEHAVIORAL SYMPTOMS/MOOD NEUROLOGICAL BOWEL NUTRITION STATUS   History of Substance use   Continent  (N/A)  AMBULATORY STATUS COMMUNICATION OF NEEDS Skin   Independent Verbally Normal                       Personal Care Assistance Level of Assistance   [place]           Functional Limitations Info   (N/A)          SPECIAL CARE FACTORS FREQUENCY  Blood pressure                     Contractures Contractures Info: Not present    Additional Factors Info   (Insulin 3x a day)               Current Medications (04/17/2016):  This is the current hospital active medication list Current Facility-Administered Medications  Medication Dose Route Frequency Provider Last Rate Last Dose  . acetaminophen (TYLENOL) tablet 650 mg  650 mg Oral Q6H PRN Audery Amel, MD      . alum & mag hydroxide-simeth (MAALOX/MYLANTA) 200-200-20 MG/5ML suspension 30 mL  30 mL Oral Q4H PRN Audery Amel, MD      . aspirin EC tablet 81 mg  81 mg Oral Daily Audery Amel, MD   81 mg at 04/17/16 0824  . atorvastatin (LIPITOR) tablet 20 mg  20 mg Oral q1800 Audery Amel, MD      . chlorthalidone (HYGROTON) tablet 25 mg  25 mg Oral Daily Audery Amel, MD   25 mg at 04/17/16 0825  . cloNIDine (CATAPRES) tablet 0.2 mg  0.2 mg Oral BID Audery Amel, MD   0.2 mg at 04/17/16 0824  . insulin aspart (novoLOG) injection 10 Units  10 Units Subcutaneous TID WC Audery Amel, MD   10 Units at 04/17/16 1142  . magnesium hydroxide (MILK OF MAGNESIA) suspension 30 mL  30 mL Oral Daily PRN Audery Amel, MD      .  metFORMIN (GLUCOPHAGE) tablet 500 mg  500 mg Oral Q breakfast Audery AmelJohn T Clapacs, MD   500 mg at 04/17/16 0824  . pantoprazole (PROTONIX) EC tablet 40 mg  40 mg Oral Daily Audery AmelJohn T Clapacs, MD   40 mg at 04/17/16 0824  . sertraline (ZOLOFT) tablet 50 mg  50 mg Oral Daily Audery AmelJohn T Clapacs, MD   50 mg at 04/17/16 0824  . traZODone (DESYREL) tablet 150 mg  150 mg Oral QHS Audery AmelJohn T Clapacs, MD         Discharge Medications: Please see discharge summary for a list of discharge medications.  Relevant Imaging Results:  Relevant Lab Results:   Additional Information    Glennon MacSara P Anasha Perfecto, LCSW

## 2016-04-17 NOTE — Tx Team (Signed)
Initial Treatment Plan 04/17/2016 2:34 AM Joni Michaell CowingPride Jr. [id number]    PATIENT STRESSORS: Financial difficulties Health problems Marital or family conflict Medication change or noncompliance Substance abuse   PATIENT STRENGTHS: General fund of knowledge Motivation for treatment/growth   PATIENT IDENTIFIED PROBLEMS: Risk for suicide  Medication non-compliance  depression   "help fining a place to live"               DISCHARGE CRITERIA:  Adequate post-discharge living arrangements Improved stabilization in mood, thinking, and/or behavior Verbal commitment to aftercare and medication compliance  PRELIMINARY DISCHARGE PLAN: Attend aftercare/continuing care group Outpatient therapy  PATIENT/FAMILY INVOLVEMENT: This treatment plan has been presented to and reviewed with the patient, Lashan Michaell CowingPride Jr..  The patient and family have been given the opportunity to ask questions and make suggestions.  Delos HaringPhillips, Raenah Murley A, RN 04/17/2016, 2:34 AM

## 2016-04-17 NOTE — Progress Notes (Signed)
Patient denies SI/HI, denies A/V hallucinations. Patient verbalizes understanding of discharge instructions, follow up care and prescriptions. [facility] Patient escorted out by staff.Discharge instructions explained to the transport person,verbalized understanding.Stated that there is no way he can get his medicines today because patient does not have any money and asking whether we can give his bedtime medicine.Informed charge nurse & explained that we can not help him in this matter.

## 2016-04-18 LAB — PROLACTIN: Prolactin: 31.4 ng/mL — ABNORMAL HIGH (ref 4.0–15.2)

## 2016-04-18 LAB — HEMOGLOBIN A1C
HEMOGLOBIN A1C: 6.7 % — AB (ref 4.8–5.6)
Mean Plasma Glucose: 146 mg/dL

## 2016-05-06 ENCOUNTER — Emergency Department
Admission: EM | Admit: 2016-05-06 | Discharge: 2016-05-06 | Disposition: A | Discharge status: Home / Self Care | Payer: Medicaid Other | Attending: Emergency Medicine | Admitting: Emergency Medicine

## 2016-05-06 ENCOUNTER — Emergency Department: Payer: Medicaid Other

## 2016-05-06 ENCOUNTER — Encounter: Payer: Self-pay | Admitting: Emergency Medicine

## 2016-05-06 DIAGNOSIS — R0789 Other chest pain: Secondary | ICD-10-CM | POA: Insufficient documentation

## 2016-05-06 DIAGNOSIS — I2510 Atherosclerotic heart disease of native coronary artery without angina pectoris: Secondary | ICD-10-CM | POA: Diagnosis not present

## 2016-05-06 DIAGNOSIS — Z7982 Long term (current) use of aspirin: Secondary | ICD-10-CM | POA: Insufficient documentation

## 2016-05-06 DIAGNOSIS — Z794 Long term (current) use of insulin: Secondary | ICD-10-CM | POA: Diagnosis not present

## 2016-05-06 DIAGNOSIS — Z87891 Personal history of nicotine dependence: Secondary | ICD-10-CM | POA: Diagnosis not present

## 2016-05-06 DIAGNOSIS — M25512 Pain in left shoulder: Secondary | ICD-10-CM | POA: Insufficient documentation

## 2016-05-06 DIAGNOSIS — I10 Essential (primary) hypertension: Secondary | ICD-10-CM | POA: Insufficient documentation

## 2016-05-06 DIAGNOSIS — R0602 Shortness of breath: Secondary | ICD-10-CM | POA: Insufficient documentation

## 2016-05-06 DIAGNOSIS — R079 Chest pain, unspecified: Secondary | ICD-10-CM

## 2016-05-06 DIAGNOSIS — E119 Type 2 diabetes mellitus without complications: Secondary | ICD-10-CM | POA: Diagnosis not present

## 2016-05-06 DIAGNOSIS — Z79899 Other long term (current) drug therapy: Secondary | ICD-10-CM | POA: Diagnosis not present

## 2016-05-06 LAB — BASIC METABOLIC PANEL
Anion gap: 7 (ref 5–15)
BUN: 23 mg/dL — AB (ref 6–20)
CALCIUM: 9 mg/dL (ref 8.9–10.3)
CO2: 24 mmol/L (ref 22–32)
CREATININE: 1.09 mg/dL (ref 0.61–1.24)
Chloride: 104 mmol/L (ref 101–111)
GFR calc non Af Amer: >60 mL/min (ref >60)
Glucose, Bld: 164 mg/dL — ABNORMAL HIGH (ref 65–99)
Potassium: 3.8 mmol/L (ref 3.5–5.1)
SODIUM: 135 mmol/L (ref 135–145)

## 2016-05-06 LAB — CBC
HCT: 43.4 % (ref 40.0–52.0)
Hemoglobin: 15.4 g/dL (ref 13.0–18.0)
MCH: 31.7 pg (ref 26.0–34.0)
MCHC: 35.5 g/dL (ref 32.0–36.0)
MCV: 89.4 fL (ref 80.0–100.0)
PLATELETS: 207 10*3/uL (ref 150–440)
RBC: 4.86 MIL/uL (ref 4.40–5.90)
RDW: 12.5 % (ref 11.5–14.5)
WBC: 8.1 10*3/uL (ref 3.8–10.6)

## 2016-05-06 LAB — TROPONIN I: TROPONIN I: 0.03 ng/mL — AB (ref <0.03)

## 2016-05-06 MED ORDER — ASPIRIN 81 MG PO CHEW
243.0000 mg | CHEWABLE_TABLET | Freq: Once | ORAL | Status: AC
  Administered 2016-05-06: 243 mg via ORAL
  Filled 2016-05-06: qty 3

## 2016-05-06 MED ORDER — NITROGLYCERIN 0.4 MG SL SUBL
0.4000 mg | SUBLINGUAL_TABLET | SUBLINGUAL | Status: DC | PRN
  Administered 2016-05-06: 0.4 mg via SUBLINGUAL
  Filled 2016-05-06: qty 1

## 2016-05-06 MED ORDER — DICLOFENAC SODIUM 3 % TD GEL: 1.0000 "application " | Freq: Two times a day (BID) | TRANSDERMAL | 0 refills | Status: DC | PRN

## 2016-05-06 NOTE — ED Triage Notes (Signed)
Pt reports shortness of breath and chest tightness x24 hours.

## 2016-05-06 NOTE — ED Notes (Signed)
Pt ambulating around lobby with no difficulty or distress noted.

## 2016-05-06 NOTE — ED Provider Notes (Signed)
[facility] Emergency Department Provider Note  ____________________________________________   First MD Initiated Contact with Patient 05/06/16 1345     (approximate)  I have reviewed the triage vital signs and the nursing notes.   HISTORY  Chief Complaint Shortness of Breath   HPI [name] [name]. is a 56 y.o. male with a history of bipolar disorder as well as coronary artery disease on aspirin who is presenting to the emergency department with 1 day of worsening chest pain as well as left shoulder pain. He says the pain started minimally and then radiated into his left shoulder where it is right now. Says that the pain worsens in the left shoulder, posteriorly when he moves his left arm. He is denying any chest pain at this time. Says that he has associated shortness of breath. Denies any nausea or vomiting. Says that the pain is a throbbing type pain. Says that it worsens at rest. Took  of aspirin this morning but is off his Plavix at this time. He says that he was "taken off of his Plavix"  as part of his treatment plan. Patient says at this time that his pain is a 10 out of 10.   Past Medical History:  Diagnosis Date  . Aortic stenosis, mild February 2015  . Auditory hallucinations   . Bipolar disorder (HCC)   . Coronary artery disease    No reported catheterization. Negative Myoview stress test and 2013.  Marland Kitchen Depression   . Diabetes mellitus   . GERD (gastroesophageal reflux disease)   . History of suicide attempt   . Hx of medication noncompliance   . Hypertension   . Hypertensive hypertrophic cardiomyopathy [facility]) February 2015   Severe concentric LVH on echocardiogram.  . Obesity, Class II, BMI 35-39.9, with comorbidity   . Pericarditis   . Polysubstance abuse    cocaine, marijuana  . Stroke [facility]) February 2015   Right MCA aneurysm; head MRI - acute infarct of the right frontal lobe. Smaller acute infarct of the left frontal lobe. Thought  to be due to cerebral emboli. Left PICA infarct. Small chronic lacunar infarct right internal capsule. Chronic microvascular ischemic change with white matter change.  . Syncope and collapse     Patient Active Problem List   Diagnosis Date Noted  . Unspecified Depressive disorder 04/17/2016  . 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 Tobacco use disorder 11/07/2014  . Stimulant use disorder (HCC) crack cocaine 11/07/2014  . Alcohol use disorder, moderate, dependence (HCC) 11/07/2014  . CAD (coronary artery disease) 11/05/2014  . Cerebral infarction (HCC) 10/19/2014  . LVH (left ventricular hypertrophy) 05/20/2013  . Obesity (BMI 30-39.9) 04/10/2013  . Aortic stenosis, mild 03/07/2013  . DM (diabetes mellitus), type 2, uncontrolled (HCC) 01/05/2013  . Essential hypertension, malignant 01/25/2009    Past Surgical History:  Procedure Laterality Date  . Carotid Dopplers:  03/23/2013   Interval thickening of bilateral common carotid no significant plaque formation. Tortuosity but no significant stenosis. Patent bilateral vertebral  arteries  . NM MYOVIEW LTD  July 2013   No ischemia or infarct. There is inferior wall defec suggestive of diaphragmatic attenuation. Mild LV dilation at to be due to hypertensive heart disease. EF 55%. Poor exercise tolerance.  Marland Kitchen PERICARDIAL FLUID DRAINAGE    . TRANSTHORACIC ECHOCARDIOGRAM  03/23/2013   EF 5560%. Elevated left atrial pressure. Severe concentric LVH. Mild aortic stenosis. -- Appearance of hypertensive hypertrophic heart disease.  No clear cardioembolic source  Prior to Admission medications   Medication Sig Start Date End Date Taking? Authorizing Provider  amLODipine (NORVASC) 10 MG tablet Take 1 tablet (10 mg total) by mouth daily. 04/17/16  Yes Jimmy Footman, MD  aspirin 81 MG EC tablet Take 1 tablet (81 mg total) by mouth daily. 04/17/16  Yes Jimmy Footman, MD  atorvastatin (LIPITOR)  20 MG tablet Take 1 tablet (20 mg total) by mouth daily at 6 PM. 04/17/16  Yes Jimmy Footman, MD  chlorthalidone (HYGROTON) 25 MG tablet Take 1 tablet (25 mg total) by mouth daily. 04/17/16  Yes Jimmy Footman, MD  cloNIDine (CATAPRES) 0.2 MG tablet Take 1 tablet (0.2 mg total) by mouth 2 (two) times daily. 04/17/16  Yes Jimmy Footman, MD  clopidogrel (PLAVIX) 75 MG tablet Take 1 tablet (75 mg total) by mouth daily. 04/17/16  Yes Jimmy Footman, MD  insulin aspart (NOVOLOG) 100 UNIT/ML injection Inject 10 Units into the skin 3 (three) times daily with meals. 04/17/16  Yes Jimmy Footman, MD  metFORMIN (GLUCOPHAGE) 500 MG tablet Take 1 tablet (500 mg total) by mouth 2 (two) times daily with a meal. 04/17/16  Yes Jimmy Footman, MD  nitroGLYCERIN (NITROSTAT) 0.4 MG SL tablet Place 1 tablet (0.4 mg total) under the tongue every 5 (five) minutes as needed for chest pain. 04/17/16  Yes Jimmy Footman, MD  pantoprazole (PROTONIX) 40 MG tablet Take 1 tablet (40 mg total) by mouth daily. 04/17/16  Yes Jimmy Footman, MD  sertraline (ZOLOFT) 50 MG tablet Take 1 tablet (50 mg total) by mouth daily. 04/17/16  Yes Jimmy Footman, MD  traZODone (DESYREL) 150 MG tablet Take 1 tablet (150 mg total) by mouth at bedtime. 04/17/16  Yes Jimmy Footman, MD    Allergies Patient has no known allergies.  Family History  Problem Relation Age of Onset  . Stroke Mother   . Diabetes type II Mother   . Hypertension Mother   . Hypertension Father   . Arthritis Father   . Arthritis Sister   . Diabetes type II Sister   . Hypertension Sister     Social History Social History  Substance Use Topics  . Smoking status: Former Smoker    Packs/day: 0.50    Years: 40.00    Types: Cigars  . Smokeless tobacco: Never Used  . Alcohol use No     Comment: occasionally; reports 1 pint of vodka per month at paydaysocially     Review of Systems Constitutional: No fever/chills Eyes: No visual changes. ENT: No sore throat. Cardiovascular: as above Respiratory: as above Gastrointestinal: No abdominal pain.  No nausea, no vomiting.  No diarrhea.  No constipation. Genitourinary: Negative for dysuria. Musculoskeletal: Negative for back pain. Skin: Negative for rash. Neurological: Negative for headaches, focal weakness or numbness.  10-point ROS otherwise negative.  ____________________________________________   PHYSICAL EXAM:  VITAL SIGNS: ED Triage Vitals  Enc Vitals Group     BP 05/06/16 0951 (!) 146/97     Pulse Rate 05/06/16 0951 77     Resp 05/06/16 0951 20     Temp 05/06/16 0951 98 F (36.7 C)     Temp Source 05/06/16 0951 Oral     SpO2 05/06/16 0951 99 %     Weight 05/06/16 0950 230 lb (104.3 kg)     Height 05/06/16 0950  (1.727 m)     Head Circumference --      Peak Flow --      Pain Score 05/06/16 0948 10  Pain Loc --      Pain Edu? --      Excl. in GC? --     Constitutional: Alert and oriented. Well appearing and in no acute distress. Eyes: Conjunctivae are normal. PERRL. EOMI. Head: Atraumatic. Nose: No congestion/rhinnorhea. Mouth/Throat: Mucous membranes are moist.  Neck: No stridor.   Cardiovascular: Normal rate, regular rhythm. Grossly normal heart sounds.  Good peripheral circulation With equal and intact radial as well as dorsalis pedis pulses. Respiratory: Normal respiratory effort.  No retractions. Lungs CTAB. Gastrointestinal: Soft and nontender. No distention. Musculoskeletal: No lower extremity tenderness nor edema.  No joint effusions. Mild tenderness to palpation over the superior aspect of the left scapula to the soft tissue. No joint swelling or limitation of range of motion to the left shoulder joint. Neurologic:  Normal speech and language. No gross focal neurologic deficits are appreciated. No gait instability. Skin:  Skin is warm, dry and intact. No  rash noted. Psychiatric: Mood and affect are normal. Speech and behavior are normal.  ____________________________________________   LABS (all labs ordered are listed, but only abnormal results are displayed)  Labs Reviewed  BASIC METABOLIC PANEL - Abnormal; Notable for the following:       Result Value   Glucose, Bld 164 (*)    BUN 23 (*)    All other components within normal limits  TROPONIN I - Abnormal; Notable for the following:    Troponin I 0.03 (*)    All other components within normal limits  CBC  TROPONIN I   ____________________________________________  EKG  ED ECG REPORT I, Arelia Longest, the attending physician, personally viewed and interpreted this ECG.   Date: 05/06/2016  EKG Time: 0954  Rate: 76  Rhythm: normal sinus rhythm  Axis: normal  Intervals:none  ST&T Change: T wave inversions in 1 as well as aVL as well as V5 and V6. Biphasic T-wave in V4. No significant change from previous EKGs.  ____________________________________________  RADIOLOGY    DG Chest 2 View (Final result)  Result time 05/06/16 11:17:41  Final result by Bretta Bang III, MD (05/06/16 11:17:41)           Narrative:   CLINICAL DATA: Two day history of shortness of breath and chest pain  EXAM: CHEST 2 VIEW  COMPARISON: April 17, 2016  FINDINGS: Lungs are clear. Heart size and pulmonary vascularity are normal. No adenopathy. Aorta is mildly tortuous but stable. No adenopathy. No bone lesions.  IMPRESSION: No edema or consolidation.   Electronically Signed By: Bretta Bang III M.D. On: 05/06/2016 11:17            ____________________________________________   PROCEDURES  Procedure(s) performed:   Procedures  Critical Care performed:   ____________________________________________   INITIAL IMPRESSION / ASSESSMENT AND PLAN / ED COURSE  Pertinent labs & imaging results that were available during my care of the patient were  reviewed by me and considered in my medical decision making (see chart for details).  We will repeat the patient's troponin as well as give nitroglycerin and reassess. Patient without any distress at this time although he says his pain is a 10 out of 10. Baseline troponin at 0.03 is unchanged from previous.     ----------------------------------------- 4:08 PM on 05/06/2016 -----------------------------------------  Patient without any distress at this time. Resting comfortably without any objective signs of pain. Second troponin was negative. He says, subjectively, they feels the same. However, he is at a very reassuring workup here. I believe  he is appropriate for outpatient follow-up and that he may have a large musculoskeletal component if not all of the pain being caused by the musculoskeletal system. He'll be discharged with diclofenac gel and follow-up in the office with cardiology. He is understanding of this plan and willing to comply.  ____________________________________________   FINAL CLINICAL IMPRESSION(S) / ED DIAGNOSES  Chest pain    NEW MEDICATIONS STARTED DURING THIS VISIT:  New Prescriptions   No medications on file     Note:  This document was prepared using Dragon voice recognition software and may include unintentional dictation errors.    Myrna Blazer, MD 05/06/16 [phone]

## 2016-05-06 NOTE — ED Notes (Signed)
Pt ambulating around lobby with no difficulty or distress noted.  

## 2016-05-06 NOTE — ED Notes (Signed)
Gave pt a sprite, graham crackers and peanut butter.

## 2016-06-07 ENCOUNTER — Emergency Department: Payer: Medicaid Other

## 2016-06-07 ENCOUNTER — Observation Stay
Admission: EM | Admit: 2016-06-07 | Discharge: 2016-06-08 | Disposition: A | Discharge status: Home / Self Care | Payer: Medicaid Other | Attending: Internal Medicine | Admitting: Internal Medicine

## 2016-06-07 ENCOUNTER — Encounter: Payer: Self-pay | Admitting: *Deleted

## 2016-06-07 DIAGNOSIS — I10 Essential (primary) hypertension: Secondary | ICD-10-CM | POA: Diagnosis present

## 2016-06-07 DIAGNOSIS — I2510 Atherosclerotic heart disease of native coronary artery without angina pectoris: Secondary | ICD-10-CM | POA: Diagnosis present

## 2016-06-07 DIAGNOSIS — Z794 Long term (current) use of insulin: Secondary | ICD-10-CM | POA: Insufficient documentation

## 2016-06-07 DIAGNOSIS — Z79899 Other long term (current) drug therapy: Secondary | ICD-10-CM | POA: Insufficient documentation

## 2016-06-07 DIAGNOSIS — Z8673 Personal history of transient ischemic attack (TIA), and cerebral infarction without residual deficits: Secondary | ICD-10-CM | POA: Diagnosis not present

## 2016-06-07 DIAGNOSIS — Z87891 Personal history of nicotine dependence: Secondary | ICD-10-CM | POA: Insufficient documentation

## 2016-06-07 DIAGNOSIS — R51 Headache: Secondary | ICD-10-CM | POA: Insufficient documentation

## 2016-06-07 DIAGNOSIS — R079 Chest pain, unspecified: Secondary | ICD-10-CM | POA: Diagnosis present

## 2016-06-07 DIAGNOSIS — I350 Nonrheumatic aortic (valve) stenosis: Secondary | ICD-10-CM | POA: Insufficient documentation

## 2016-06-07 DIAGNOSIS — K219 Gastro-esophageal reflux disease without esophagitis: Secondary | ICD-10-CM | POA: Diagnosis not present

## 2016-06-07 DIAGNOSIS — E1165 Type 2 diabetes mellitus with hyperglycemia: Secondary | ICD-10-CM | POA: Insufficient documentation

## 2016-06-07 DIAGNOSIS — Z7982 Long term (current) use of aspirin: Secondary | ICD-10-CM | POA: Diagnosis not present

## 2016-06-07 DIAGNOSIS — I119 Hypertensive heart disease without heart failure: Secondary | ICD-10-CM | POA: Insufficient documentation

## 2016-06-07 DIAGNOSIS — Z7902 Long term (current) use of antithrombotics/antiplatelets: Secondary | ICD-10-CM | POA: Diagnosis not present

## 2016-06-07 DIAGNOSIS — F32A Depression, unspecified: Secondary | ICD-10-CM | POA: Diagnosis present

## 2016-06-07 DIAGNOSIS — R0789 Other chest pain: Principal | ICD-10-CM | POA: Insufficient documentation

## 2016-06-07 DIAGNOSIS — Z915 Personal history of self-harm: Secondary | ICD-10-CM | POA: Insufficient documentation

## 2016-06-07 DIAGNOSIS — R519 Headache, unspecified: Secondary | ICD-10-CM

## 2016-06-07 DIAGNOSIS — F319 Bipolar disorder, unspecified: Secondary | ICD-10-CM | POA: Diagnosis present

## 2016-06-07 DIAGNOSIS — G3184 Mild cognitive impairment, so stated: Secondary | ICD-10-CM | POA: Diagnosis present

## 2016-06-07 DIAGNOSIS — IMO0002 Reserved for concepts with insufficient information to code with codable children: Secondary | ICD-10-CM | POA: Diagnosis present

## 2016-06-07 DIAGNOSIS — F329 Major depressive disorder, single episode, unspecified: Secondary | ICD-10-CM | POA: Diagnosis present

## 2016-06-07 DIAGNOSIS — F0670 Mild neurocognitive disorder due to known physiological condition without behavioral disturbance: Secondary | ICD-10-CM | POA: Diagnosis present

## 2016-06-07 LAB — CBC WITH DIFFERENTIAL/PLATELET
BASOS ABS: 0.1 10*3/uL (ref 0–0.1)
BASOS PCT: 1 %
EOS PCT: 4 %
Eosinophils Absolute: 0.4 10*3/uL (ref 0–0.7)
HCT: 40.8 % (ref 40.0–52.0)
Hemoglobin: 14.4 g/dL (ref 13.0–18.0)
LYMPHS PCT: 28 %
Lymphs Abs: 3.2 10*3/uL (ref 1.0–3.6)
MCH: 31.4 pg (ref 26.0–34.0)
MCHC: 35.4 g/dL (ref 32.0–36.0)
MCV: 88.7 fL (ref 80.0–100.0)
MONO ABS: 0.8 10*3/uL (ref 0.2–1.0)
Monocytes Relative: 7 %
Neutro Abs: 7 10*3/uL — ABNORMAL HIGH (ref 1.4–6.5)
Neutrophils Relative %: 60 %
PLATELETS: 192 10*3/uL (ref 150–440)
RBC: 4.6 MIL/uL (ref 4.40–5.90)
RDW: 12.6 % (ref 11.5–14.5)
WBC: 11.5 10*3/uL — AB (ref 3.8–10.6)

## 2016-06-07 LAB — COMPREHENSIVE METABOLIC PANEL
ALBUMIN: 4 g/dL (ref 3.5–5.0)
ALT: 18 U/L (ref 17–63)
ANION GAP: 7 (ref 5–15)
AST: 21 U/L (ref 15–41)
Alkaline Phosphatase: 55 U/L (ref 38–126)
BUN: 19 mg/dL (ref 6–20)
CALCIUM: 9.1 mg/dL (ref 8.9–10.3)
CHLORIDE: 102 mmol/L (ref 101–111)
CO2: 26 mmol/L (ref 22–32)
Creatinine, Ser: 0.93 mg/dL (ref 0.61–1.24)
GFR calc non Af Amer: >60 mL/min (ref >60)
GLUCOSE: 269 mg/dL — AB (ref 65–99)
POTASSIUM: 3.5 mmol/L (ref 3.5–5.1)
SODIUM: 135 mmol/L (ref 135–145)
Total Bilirubin: 0.7 mg/dL (ref 0.3–1.2)
Total Protein: 6.8 g/dL (ref 6.5–8.1)

## 2016-06-07 LAB — TROPONIN I
TROPONIN I: 0.04 ng/mL — AB (ref <0.03)
Troponin I: 0.04 ng/mL (ref <0.03)

## 2016-06-07 LAB — GLUCOSE, CAPILLARY: GLUCOSE-CAPILLARY: 219 mg/dL — AB (ref 65–99)

## 2016-06-07 LAB — BRAIN NATRIURETIC PEPTIDE: B NATRIURETIC PEPTIDE 5: 98 pg/mL (ref 0.0–100.0)

## 2016-06-07 MED ORDER — ONDANSETRON HCL 4 MG PO TABS: 4.0000 mg | ORAL_TABLET | Freq: Four times a day (QID) | ORAL | Status: DC | PRN

## 2016-06-07 MED ORDER — PANTOPRAZOLE SODIUM 40 MG PO TBEC
40.0000 mg | DELAYED_RELEASE_TABLET | Freq: Every day | ORAL | Status: DC
  Administered 2016-06-08: 40 mg via ORAL
  Filled 2016-06-07: qty 1

## 2016-06-07 MED ORDER — CLONIDINE HCL 0.1 MG PO TABS
0.2000 mg | ORAL_TABLET | Freq: Once | ORAL | Status: AC
  Administered 2016-06-07: 0.2 mg via ORAL
  Filled 2016-06-07: qty 2

## 2016-06-07 MED ORDER — ENOXAPARIN SODIUM 40 MG/0.4ML ~~LOC~~ SOLN: 40.0000 mg | SUBCUTANEOUS | Status: DC

## 2016-06-07 MED ORDER — ACETAMINOPHEN 325 MG PO TABS
650.0000 mg | ORAL_TABLET | Freq: Four times a day (QID) | ORAL | Status: DC | PRN
  Administered 2016-06-07 – 2016-06-08 (×2): 650 mg via ORAL
  Filled 2016-06-07 (×2): qty 2

## 2016-06-07 MED ORDER — ACETAMINOPHEN 650 MG RE SUPP: 650.0000 mg | Freq: Four times a day (QID) | RECTAL | Status: DC | PRN

## 2016-06-07 MED ORDER — INSULIN ASPART 100 UNIT/ML ~~LOC~~ SOLN
0.0000 [IU] | Freq: Four times a day (QID) | SUBCUTANEOUS | Status: DC
  Administered 2016-06-07 – 2016-06-08 (×2): 3 [IU] via SUBCUTANEOUS
  Filled 2016-06-07 (×2): qty 3

## 2016-06-07 MED ORDER — LABETALOL HCL 5 MG/ML IV SOLN: 10.0000 mg | INTRAVENOUS | Status: DC | PRN

## 2016-06-07 MED ORDER — HYDRALAZINE HCL 20 MG/ML IJ SOLN
10.0000 mg | INTRAMUSCULAR | Status: DC | PRN
Start: 2016-06-07 — End: 2016-06-08

## 2016-06-07 MED ORDER — CLOPIDOGREL BISULFATE 75 MG PO TABS
75.0000 mg | ORAL_TABLET | Freq: Every day | ORAL | Status: DC
  Administered 2016-06-08: 75 mg via ORAL
  Filled 2016-06-07: qty 1

## 2016-06-07 MED ORDER — ASPIRIN EC 81 MG PO TBEC
81.0000 mg | DELAYED_RELEASE_TABLET | Freq: Every day | ORAL | Status: DC
  Administered 2016-06-08: 81 mg via ORAL
  Filled 2016-06-07: qty 1

## 2016-06-07 MED ORDER — ATORVASTATIN CALCIUM 20 MG PO TABS: 20.0000 mg | ORAL_TABLET | Freq: Every day | ORAL | Status: DC

## 2016-06-07 MED ORDER — LABETALOL HCL 5 MG/ML IV SOLN
20.0000 mg | Freq: Once | INTRAVENOUS | Status: AC
Start: 2016-06-07 — End: 2016-06-07
  Administered 2016-06-07: 20 mg via INTRAVENOUS
  Filled 2016-06-07: qty 4

## 2016-06-07 MED ORDER — HYDRALAZINE HCL 20 MG/ML IJ SOLN
10.0000 mg | INTRAMUSCULAR | Status: DC | PRN
  Filled 2016-06-07: qty 1

## 2016-06-07 MED ORDER — ONDANSETRON HCL 4 MG/2ML IJ SOLN: 4.0000 mg | Freq: Four times a day (QID) | INTRAMUSCULAR | Status: DC | PRN

## 2016-06-07 MED ORDER — CLONIDINE HCL 0.1 MG PO TABS
0.2000 mg | ORAL_TABLET | Freq: Two times a day (BID) | ORAL | Status: DC
  Administered 2016-06-07 – 2016-06-08 (×2): 0.2 mg via ORAL
  Filled 2016-06-07 (×2): qty 2

## 2016-06-07 MED ORDER — LORAZEPAM 2 MG/ML IJ SOLN
1.0000 mg | Freq: Once | INTRAMUSCULAR | Status: AC
Start: 2016-06-07 — End: 2016-06-07
  Administered 2016-06-07: 1 mg via INTRAVENOUS
  Filled 2016-06-07: qty 1

## 2016-06-07 NOTE — ED Notes (Signed)
Spoke with admitting doctor about patients blood pressure. Per admitting try hydralazine. Went into room to give patient medication, patient was resting, snoring noted and blood pressure was 172/100.

## 2016-06-07 NOTE — ED Triage Notes (Signed)
Pt arrives via EMS from the group home, pt states a headache that began at 0300, states he took Aleve with no relief, states chest pain that goes to his left shoulder, hx of CVA with left sided deficits, awake and alert upon arrival, EMS reports BP of 232/143. And 194/121, pt has hx of htn and states he took his BP meds today

## 2016-06-07 NOTE — ED Notes (Signed)
Dr. Alphonzo LemmingsMcShane notified of critical trop of 0.04
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
## 2016-06-07 NOTE — ED Notes (Signed)
[facility] # [phone].

## 2016-06-07 NOTE — ED Notes (Signed)
Gave patient warm blanket. Wanting to know when he gets to go home. Explained to patient we have to get his blood pressure down. Patient laying on stretcher resting.

## 2016-06-07 NOTE — ED Notes (Signed)
Gave patient sandwich tray.
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
## 2016-06-08 ENCOUNTER — Observation Stay
Admit: 2016-06-08 | Discharge: 2016-06-08 | Disposition: A | Discharge status: Home / Self Care | Payer: Medicaid Other | Attending: Internal Medicine | Admitting: Internal Medicine

## 2016-06-08 DIAGNOSIS — G3184 Mild cognitive impairment, so stated: Secondary | ICD-10-CM | POA: Diagnosis not present

## 2016-06-08 LAB — CBC
HEMATOCRIT: 40.3 % (ref 40.0–52.0)
Hemoglobin: 14.4 g/dL (ref 13.0–18.0)
MCH: 31.9 pg (ref 26.0–34.0)
MCHC: 35.6 g/dL (ref 32.0–36.0)
MCV: 89.7 fL (ref 80.0–100.0)
Platelets: 177 10*3/uL (ref 150–440)
RBC: 4.5 MIL/uL (ref 4.40–5.90)
RDW: 12.4 % (ref 11.5–14.5)
WBC: 11.9 10*3/uL — ABNORMAL HIGH (ref 3.8–10.6)

## 2016-06-08 LAB — URINE DRUG SCREEN, QUALITATIVE (ARMC ONLY)
Amphetamines, Ur Screen: NOT DETECTED
BARBITURATES, UR SCREEN: NOT DETECTED
BENZODIAZEPINE, UR SCRN: NOT DETECTED
CANNABINOID 50 NG, UR ~~LOC~~: NOT DETECTED
Cocaine Metabolite,Ur ~~LOC~~: NOT DETECTED
MDMA (Ecstasy)Ur Screen: NOT DETECTED
METHADONE SCREEN, URINE: NOT DETECTED
Opiate, Ur Screen: NOT DETECTED
Phencyclidine (PCP) Ur S: NOT DETECTED
TRICYCLIC, UR SCREEN: NOT DETECTED

## 2016-06-08 LAB — TROPONIN I
TROPONIN I: 0.03 ng/mL — AB (ref <0.03)
Troponin I: 0.03 ng/mL (ref <0.03)

## 2016-06-08 LAB — BASIC METABOLIC PANEL
Anion gap: 6 (ref 5–15)
BUN: 16 mg/dL (ref 6–20)
CHLORIDE: 101 mmol/L (ref 101–111)
CO2: 27 mmol/L (ref 22–32)
Calcium: 8.4 mg/dL — ABNORMAL LOW (ref 8.9–10.3)
Creatinine, Ser: 0.83 mg/dL (ref 0.61–1.24)
GFR calc Af Amer: >60 mL/min (ref >60)
GFR calc non Af Amer: >60 mL/min (ref >60)
GLUCOSE: 216 mg/dL — AB (ref 65–99)
POTASSIUM: 3.5 mmol/L (ref 3.5–5.1)
Sodium: 134 mmol/L — ABNORMAL LOW (ref 135–145)

## 2016-06-08 LAB — GLUCOSE, CAPILLARY
GLUCOSE-CAPILLARY: 220 mg/dL — AB (ref 65–99)
GLUCOSE-CAPILLARY: 220 mg/dL — AB (ref 65–99)

## 2016-06-08 LAB — MRSA PCR SCREENING: MRSA by PCR: NEGATIVE

## 2016-06-08 LAB — ECHOCARDIOGRAM COMPLETE
Height: 69 in
Weight: 3990.4 oz

## 2016-06-08 MED ORDER — TRAZODONE HCL 100 MG PO TABS: 150.0000 mg | ORAL_TABLET | Freq: Every day | ORAL | Status: DC

## 2016-06-08 MED ORDER — SERTRALINE HCL 50 MG PO TABS
50.0000 mg | ORAL_TABLET | Freq: Every day | ORAL | Status: DC
  Administered 2016-06-08: 50 mg via ORAL
  Filled 2016-06-08: qty 1

## 2016-06-08 MED ORDER — INSULIN ASPART 100 UNIT/ML ~~LOC~~ SOLN: 10.0000 [IU] | Freq: Three times a day (TID) | SUBCUTANEOUS | Status: DC

## 2016-06-08 MED ORDER — AMLODIPINE BESYLATE 10 MG PO TABS: 10.0000 mg | ORAL_TABLET | Freq: Every day | ORAL | Status: DC

## 2016-06-08 MED ORDER — NITROGLYCERIN 0.4 MG SL SUBL: 0.4000 mg | SUBLINGUAL_TABLET | SUBLINGUAL | Status: DC | PRN

## 2016-06-08 MED ORDER — CHLORTHALIDONE 25 MG PO TABS
25.0000 mg | ORAL_TABLET | Freq: Every day | ORAL | Status: DC
  Administered 2016-06-08: 25 mg via ORAL
  Filled 2016-06-08: qty 1

## 2016-06-08 MED ORDER — INSULIN ASPART 100 UNIT/ML ~~LOC~~ SOLN
0.0000 [IU] | Freq: Three times a day (TID) | SUBCUTANEOUS | Status: DC
  Administered 2016-06-08: 3 [IU] via SUBCUTANEOUS
  Filled 2016-06-08: qty 3

## 2016-06-08 MED ORDER — METFORMIN HCL 500 MG PO TABS
500.0000 mg | ORAL_TABLET | Freq: Two times a day (BID) | ORAL | Status: DC
  Administered 2016-06-08: 500 mg via ORAL
  Filled 2016-06-08: qty 1

## 2016-06-08 MED ORDER — LISINOPRIL 10 MG PO TABS: 10.0000 mg | ORAL_TABLET | Freq: Every day | ORAL | 1 refills | Status: DC

## 2016-06-08 MED ORDER — METFORMIN HCL 500 MG PO TABS: 500.0000 mg | ORAL_TABLET | Freq: Two times a day (BID) | ORAL | Status: DC

## 2016-06-08 MED ORDER — LISINOPRIL 10 MG PO TABS
10.0000 mg | ORAL_TABLET | Freq: Every day | ORAL | Status: DC
Start: 2016-06-08 — End: 2016-06-08
  Administered 2016-06-08: 10 mg via ORAL
  Filled 2016-06-08: qty 1

## 2016-06-08 NOTE — NC FL2 (Signed)
[place] MEDICAID FL2 LEVEL OF CARE SCREENING TOOL     IDENTIFICATION  Patient Name: [name] [name]. Birthdate: 1960-02-15 Sex: male Admission Date (Current Location): 06/07/2016  [place] and [place] Number:  Chiropodist and Address:  [facility], [address], [place], [place] [address]      Provider Number: [id number]  Attending Physician Name and Address:  Enedina Finner, MD  Relative Name and Phone Number:       Current Level of Care: Hospital Recommended Level of Care: Family Care Home (Group Home) Prior Approval Number:    Date Approved/Denied:   PASRR Number:    Discharge Plan: Other (Comment) (Group Home)    Current Diagnoses: Patient Active Problem List   Diagnosis Date Noted  . Accelerated hypertension 06/07/2016  . GERD (gastroesophageal reflux disease) 06/07/2016  . Bipolar affective disorder (HCC) 06/07/2016  . Chest pain 06/07/2016  . Unspecified Depressive disorder 04/17/2016  . 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 Tobacco use disorder 11/07/2014  . Stimulant use disorder (HCC) crack cocaine 11/07/2014  . Alcohol use disorder, moderate, dependence (HCC) 11/07/2014  . CAD (coronary artery disease) 11/05/2014  . Cerebral infarction (HCC) 10/19/2014  . LVH (left ventricular hypertrophy) 05/20/2013  . Obesity (BMI 30-39.9) 04/10/2013  . Aortic stenosis, mild 03/07/2013  . DM (diabetes mellitus), type 2, uncontrolled (HCC) 01/05/2013  . Essential hypertension, malignant 01/25/2009    Orientation RESPIRATION BLADDER Height & Weight     Self, Time, Situation, Place  Normal Continent Weight: 249 lb 6.4 oz (113.1 kg) Height:  5\' 9"  (175.3 cm)  BEHAVIORAL SYMPTOMS/MOOD NEUROLOGICAL BOWEL NUTRITION STATUS      Continent Diet (Heart Healthy)  AMBULATORY STATUS COMMUNICATION OF NEEDS Skin   Independent Verbally Normal                       Personal Care Assistance Level of  Assistance  Bathing, Feeding, Dressing Bathing Assistance: Independent Feeding assistance: Independent Dressing Assistance: Independent     Functional Limitations Info             SPECIAL CARE FACTORS FREQUENCY                       Contractures      Additional Factors Info                  Current Medications (06/08/2016):  This is the current hospital active medication list Current Facility-Administered Medications  Medication Dose Route Frequency Provider Last Rate Last Dose  . acetaminophen (TYLENOL) tablet 650 mg  650 mg Oral Q6H PRN Oralia Manis, MD   650 mg at 06/08/16 1050   Or  . acetaminophen (TYLENOL) suppository 650 mg  650 mg Rectal Q6H PRN Oralia Manis, MD      . aspirin EC tablet 81 mg  81 mg Oral Daily Oralia Manis, MD   81 mg at 06/08/16 0919  . atorvastatin (LIPITOR) tablet 20 mg  20 mg Oral q1800 Oralia Manis, MD      . chlorthalidone (HYGROTON) tablet 25 mg  25 mg Oral Daily Enedina Finner, MD   25 mg at 06/08/16 0919  . cloNIDine (CATAPRES) tablet 0.2 mg  0.2 mg Oral BID Oralia Manis, MD   0.2 mg at 06/08/16 6295  . clopidogrel (PLAVIX) tablet 75 mg  75 mg Oral Daily Oralia Manis, MD   75 mg at 06/08/16 0919  . enoxaparin (LOVENOX)  injection 40 mg  40 mg Subcutaneous Q24H Oralia Manis, MD      . hydrALAZINE (APRESOLINE) injection 10 mg  10 mg Intravenous Q4H PRN Oralia Manis, MD      . insulin aspart (novoLOG) injection 0-9 Units  0-9 Units Subcutaneous TID WC Cindi Carbon, RPH      . lisinopril (PRINIVIL,ZESTRIL) tablet 10 mg  10 mg Oral Daily Enedina Finner, MD   10 mg at 06/08/16 0919  . metFORMIN (GLUCOPHAGE) tablet 500 mg  500 mg Oral BID WC Enedina Finner, MD   500 mg at 06/08/16 0919  . nitroGLYCERIN (NITROSTAT) SL tablet 0.4 mg  0.4 mg Sublingual Q5 min PRN Enedina Finner, MD      . ondansetron [facility]) tablet 4 mg  4 mg Oral Q6H PRN Oralia Manis, MD       Or  . ondansetron [facility]) injection 4 mg  4 mg Intravenous Q6H PRN Oralia Manis,  MD      . pantoprazole (PROTONIX) EC tablet 40 mg  40 mg Oral Daily Oralia Manis, MD   40 mg at 06/08/16 0919  . sertraline (ZOLOFT) tablet 50 mg  50 mg Oral Daily Enedina Finner, MD   50 mg at 06/08/16 0919  . traZODone (DESYREL) tablet 150 mg  150 mg Oral QHS Enedina Finner, MD         Discharge Medications: DISCHARGE MEDICATIONS:       Current Discharge Medication List        START taking these medications   Details  lisinopril (PRINIVIL,ZESTRIL) 10 MG tablet Take 1 tablet (10 mg total) by mouth daily. Qty: 30 tablet, Refills: 1          CONTINUE these medications which have NOT CHANGED   Details  amLODipine (NORVASC) 10 MG tablet Take 1 tablet (10 mg total) by mouth daily. Qty: 30 tablet, Refills: 0    aspirin 81 MG EC tablet Take 1 tablet (81 mg total) by mouth daily. Qty: 30 tablet, Refills: 0    atorvastatin (LIPITOR) 20 MG tablet Take 1 tablet (20 mg total) by mouth daily at 6 PM. Qty: 30 tablet, Refills: 0    chlorthalidone (HYGROTON) 25 MG tablet Take 1 tablet (25 mg total) by mouth daily. Qty: 30 tablet, Refills: 0    cloNIDine (CATAPRES) 0.2 MG tablet Take 1 tablet (0.2 mg total) by mouth 2 (two) times daily. Qty: 60 tablet, Refills: 0    clopidogrel (PLAVIX) 75 MG tablet Take 1 tablet (75 mg total) by mouth daily. Qty: 30 tablet, Refills: 0    metFORMIN (GLUCOPHAGE) 500 MG tablet Take 1 tablet (500 mg total) by mouth 2 (two) times daily with a meal. Qty: 60 tablet, Refills: 0    nitroGLYCERIN (NITROSTAT) 0.4 MG SL tablet Place 1 tablet (0.4 mg total) under the tongue every 5 (five) minutes as needed for chest pain. Qty: 15 tablet, Refills: 0    pantoprazole (PROTONIX) 40 MG tablet Take 1 tablet (40 mg total) by mouth daily. Qty: 30 tablet, Refills: 0    sertraline (ZOLOFT) 50 MG tablet Take 1 tablet (50 mg total) by mouth daily. Qty: 30 tablet, Refills: 0    traZODone (DESYREL) 150 MG tablet Take 1 tablet (150 mg total) by mouth at  bedtime. Qty: 30 tablet, Refills: 0         STOP taking these medications     insulin aspart (NOVOLOG) 100 UNIT/ML injection       Relevant Imaging Results:  Relevant  Lab Results:   Additional Information SS# [id number]  Judi CongKaren M Ersie Savino, LCSW
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
## 2016-06-08 NOTE — Consult Note (Signed)
[facility] Consult   Reason for Consult:  Consult for 56 year old man with a history of alcohol abuse and irritability Referring Physician:  Posey Pronto Patient Identification: [name] [name]. MRN:  [id number] Principal Diagnosis: Mild neurocognitive disorder due to another medical condition Diagnosis:   Patient Active Problem List   Diagnosis Date Noted  . Accelerated hypertension [I10] 06/07/2016  . GERD (gastroesophageal reflux disease) [K21.9] 06/07/2016  . Bipolar affective disorder ([place]) [F31.9] 06/07/2016  . Chest pain [R07.9] 06/07/2016  . Unspecified Depressive disorder [F32.9] 04/17/2016  . 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 Tobacco use disorder [F17.200] 11/07/2014  . Stimulant use disorder ([place]) crack cocaine [F15.90] 11/07/2014  . Alcohol use disorder, moderate, dependence ([place]) [F10.20] 11/07/2014  . CAD (coronary artery disease) [I25.10] 11/05/2014  . Cerebral infarction ([place]) [I63.9] 10/19/2014  . LVH (left ventricular hypertrophy) [I51.7] 05/20/2013  . Obesity (BMI 30-39.9) [E66.9] 04/10/2013  . Aortic stenosis, mild [I35.0] 03/07/2013  . DM (diabetes mellitus), type 2, uncontrolled ([place]) [E11.65] 01/05/2013  . Essential hypertension, malignant [I10] 01/25/2009    Total Time spent with patient: 45 minutes  Subjective:   [name] Viraaj Vorndran. is a 56 y.o. male patient admitted with "my blood pressure was high".  HPI:  This is a 56 year old man with history of alcohol abuse and behavior problems mood instability. Came into the hospital this time mostly with medical issues but there are also reports that he has continued to be irritable and grumpy and sometimes involved in altercations at his group home. On interview today the patient only identifies his blood pressure as his presenting problem. He says he is still living at the group home and he is willing to go back there although he doesn't like it very much. He  says he is working on trying to get an independent apartment at the [facility] is helping him with this. Patient says overall his mood is feeling fairly good. He gets nervous and irritable at times but finds his medicine helpful. Sleep is mostly adequate. Denies any hallucinations at all. No suicidal or homicidal ideation. Patient is staying off of alcohol and drugs. He claims to be compliant with his medication although that's a little questionable given how high his blood pressure was when he came in.  Social history: Patient does get disability. He is currently living in a group home although it sounds like independent living would probably be a better fit for him as long as he could still stay sober. He is working with [facility] on this.  Medical history: Patient has had several modest strokes identified which clearly contributed to his chronic cognitive impairment and irritability. Market high blood pressure that requires multiple medications. Possible noncompliance.  Substance abuse history: History of abuse of alcohol primarily occasionally other drugs.  Past Psychiatric History: Patient has had presentations to psychiatry essentially because he was drinking homeless and irritable and out of sorts and had poor self-care. He does not appear to have a chronic psychotic disorder or bipolar disorder so much as probably cognitive impairment from his strokes and chronic drinking. Current medications seem to been of some benefit. Controlled substances would be high risk for abuse. No history of clear suicide attempts that I can identify.  Risk to Self: Is patient at risk for suicide?: No Risk to Others:   Prior Inpatient Therapy:   Prior Outpatient Therapy:    Past Medical History:  Past Medical History:  Diagnosis Date  . Aortic stenosis, mild  February 2015  . Auditory hallucinations   . Bipolar disorder ([place])   . Coronary artery disease    No reported catheterization.  Negative Myoview stress test and 2013.  Marland Kitchen Depression   . Diabetes mellitus   . GERD (gastroesophageal reflux disease)   . History of suicide attempt   . Hx of medication noncompliance   . Hypertension   . Hypertensive hypertrophic cardiomyopathy [facility]) February 2015   Severe concentric LVH on echocardiogram.  . Obesity, Class II, BMI 35-39.9, with comorbidity   . Pericarditis   . Polysubstance abuse    cocaine, marijuana  . Stroke [facility]) February 2015   Right MCA aneurysm; head MRI - acute infarct of the right frontal lobe. Smaller acute infarct of the left frontal lobe. Thought to be due to cerebral emboli. Left PICA infarct. Small chronic lacunar infarct right internal capsule. Chronic microvascular ischemic change with white matter change.  . Syncope and collapse     Past Surgical History:  Procedure Laterality Date  . Carotid Dopplers:  03/23/2013   Interval thickening of bilateral common carotid no significant plaque formation. Tortuosity but no significant stenosis. Patent bilateral vertebral  arteries  . NM MYOVIEW LTD  July 2013   No ischemia or infarct. There is inferior wall defec suggestive of diaphragmatic attenuation. Mild LV dilation at to be due to hypertensive heart disease. EF 55%. Poor exercise tolerance.  Marland Kitchen PERICARDIAL FLUID DRAINAGE    . TRANSTHORACIC ECHOCARDIOGRAM  03/23/2013   EF 5560%. Elevated left atrial pressure. Severe concentric LVH. Mild aortic stenosis. -- Appearance of hypertensive hypertrophic heart disease.  No clear cardioembolic source   Family History:  Family History  Problem Relation Age of Onset  . Stroke Mother   . Diabetes type II Mother   . Hypertension Mother   . Hypertension Father   . Arthritis Father   . Arthritis Sister   . Diabetes type II Sister   . Hypertension Sister    Family Psychiatric  History: None identified Social History:  History  Alcohol Use No    Comment: occasionally; reports 1 pint of vodka per month at  paydaysocially     History  Drug Use  . Frequency: 1.0 time per week  . Types: Marijuana, Cocaine    Comment: past use; denies current; UDS positive for cocaine    Social History   Social History  . Marital status: Single    Spouse name: N/A  . Number of children: N/A  . Years of education: N/A   Social History Main Topics  . Smoking status: Former Smoker    Packs/day: 0.50    Years: 40.00    Types: Cigars  . Smokeless tobacco: Never Used  . Alcohol use No     Comment: occasionally; reports 1 pint of vodka per month at paydaysocially  . Drug use: Yes    Frequency: 1.0 time per week    Types: Marijuana, Cocaine     Comment: past use; denies current; UDS positive for cocaine  . Sexual activity: Yes   Other Topics Concern  . None   Social History Narrative   Single with no children.  No exercise.   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   Has a history of polysubstance abuse including cocaine. No she she urine toxicology screen in the hospital was positive.  He also current smoker.   He is seen by behavioral health for bipolar disorder.   Additional Social  History:    Allergies:  No Known Allergies  Labs:  Results for orders placed or performed during the hospital encounter of 06/07/16 (from the past 48 hour(s))  CBC with Differential     Status: Abnormal   Collection Time: 06/07/16  5:07 PM  Result Value Ref Range   WBC 11.5 (H) 3.8 - 10.6 K/uL   RBC 4.60 4.40 - 5.90 MIL/uL   Hemoglobin 14.4 13.0 - 18.0 g/dL   HCT 40.8 40.0 - 52.0 %   MCV 88.7 80.0 - 100.0 fL   MCH 31.4 26.0 - 34.0 pg   MCHC 35.4 32.0 - 36.0 g/dL   RDW 12.6 11.5 - 14.5 %   Platelets 192 150 - 440 K/uL   Neutrophils Relative % 60 %   Neutro Abs 7.0 (H) 1.4 - 6.5 K/uL   Lymphocytes Relative 28 %   Lymphs Abs 3.2 1.0 - 3.6 K/uL   Monocytes Relative 7 %   Monocytes Absolute 0.8 0.2 - 1.0 K/uL   Eosinophils Relative 4 %   Eosinophils Absolute 0.4  0 - 0.7 K/uL   Basophils Relative 1 %   Basophils Absolute 0.1 0 - 0.1 K/uL  Troponin I     Status: Abnormal   Collection Time: 06/07/16  5:07 PM  Result Value Ref Range   Troponin I 0.04 (HH) <0.03 ng/mL    Comment: CRITICAL RESULT CALLED TO, READ BACK BY AND VERIFIED WITH OLIVIA BROOMER @ 1818 06/07/16 BY [facility]   Brain natriuretic peptide     Status: None   Collection Time: 06/07/16  5:07 PM  Result Value Ref Range   B Natriuretic Peptide 98.0 0.0 - 100.0 pg/mL  Comprehensive metabolic panel     Status: Abnormal   Collection Time: 06/07/16  5:07 PM  Result Value Ref Range   Sodium 135 135 - 145 mmol/L   Potassium 3.5 3.5 - 5.1 mmol/L   Chloride 102 101 - 111 mmol/L   CO2 26 22 - 32 mmol/L   Glucose, Bld 269 (H) 65 - 99 mg/dL   BUN 19 6 - 20 mg/dL   Creatinine, Ser 0.93 0.61 - 1.24 mg/dL   Calcium 9.1 8.9 - 10.3 mg/dL   Total Protein 6.8 6.5 - 8.1 g/dL   Albumin 4.0 3.5 - 5.0 g/dL   AST 21 15 - 41 U/L   ALT 18 17 - 63 U/L   Alkaline Phosphatase 55 38 - 126 U/L   Total Bilirubin 0.7 0.3 - 1.2 mg/dL   GFR calc non Af Amer >60 >60 mL/min   GFR calc Af Amer >60 >60 mL/min    Comment: (NOTE) The eGFR has been calculated using the CKD EPI equation. This calculation has not been validated in all clinical situations. eGFR's persistently <60 mL/min signify possible Chronic Kidney Disease.    Anion gap 7 5 - 15  Troponin I     Status: Abnormal   Collection Time: 06/07/16  8:53 PM  Result Value Ref Range   Troponin I 0.04 (HH) <0.03 ng/mL    Comment: CRITICAL VALUE NOTED. VALUE IS CONSISTENT WITH PREVIOUSLY REPORTED/CALLED VALUE.PMH  Glucose, capillary     Status: Abnormal   Collection Time: 06/07/16 11:07 PM  Result Value Ref Range   Glucose-Capillary 219 (H) 65 - 99 mg/dL  MRSA PCR Screening     Status: None   Collection Time: 06/07/16 11:12 PM  Result Value Ref Range   MRSA by PCR NEGATIVE NEGATIVE    Comment:  The GeneXpert MRSA Assay (FDA approved for NASAL  specimens only), is one component of a comprehensive MRSA colonization surveillance program. It is not intended to diagnose MRSA infection nor to guide or monitor treatment for MRSA infections.   Troponin I     Status: Abnormal   Collection Time: 06/08/16  4:26 AM  Result Value Ref Range   Troponin I 0.03 (HH) <0.03 ng/mL    Comment: CRITICAL VALUE NOTED. VALUE IS CONSISTENT WITH PREVIOUSLY REPORTED/CALLED VALUE.PMH  Basic metabolic panel     Status: Abnormal   Collection Time: 06/08/16  4:26 AM  Result Value Ref Range   Sodium 134 (L) 135 - 145 mmol/L   Potassium 3.5 3.5 - 5.1 mmol/L   Chloride 101 101 - 111 mmol/L   CO2 27 22 - 32 mmol/L   Glucose, Bld 216 (H) 65 - 99 mg/dL   BUN 16 6 - 20 mg/dL   Creatinine, Ser 0.83 0.61 - 1.24 mg/dL   Calcium 8.4 (L) 8.9 - 10.3 mg/dL   GFR calc non Af Amer >60 >60 mL/min   GFR calc Af Amer >60 >60 mL/min    Comment: (NOTE) The eGFR has been calculated using the CKD EPI equation. This calculation has not been validated in all clinical situations. eGFR's persistently <60 mL/min signify possible Chronic Kidney Disease.    Anion gap 6 5 - 15  CBC     Status: Abnormal   Collection Time: 06/08/16  4:26 AM  Result Value Ref Range   WBC 11.9 (H) 3.8 - 10.6 K/uL   RBC 4.50 4.40 - 5.90 MIL/uL   Hemoglobin 14.4 13.0 - 18.0 g/dL   HCT 40.3 40.0 - 52.0 %   MCV 89.7 80.0 - 100.0 fL   MCH 31.9 26.0 - 34.0 pg   MCHC 35.6 32.0 - 36.0 g/dL   RDW 12.4 11.5 - 14.5 %   Platelets 177 150 - 440 K/uL  Glucose, capillary     Status: Abnormal   Collection Time: 06/08/16  5:56 AM  Result Value Ref Range   Glucose-Capillary 220 (H) 65 - 99 mg/dL  Troponin I     Status: Abnormal   Collection Time: 06/08/16 10:19 AM  Result Value Ref Range   Troponin I 0.03 (HH) <0.03 ng/mL    Comment: CRITICAL VALUE NOTED. VALUE IS CONSISTENT WITH PREVIOUSLY REPORTED/CALLED VALUE [facility]   Urine Drug Screen, Qualitative ([place] only)     Status: None   Collection Time:  06/08/16 10:50 AM  Result Value Ref Range   Tricyclic, Ur Screen NONE DETECTED NONE DETECTED   Amphetamines, Ur Screen NONE DETECTED NONE DETECTED   MDMA (Ecstasy)Ur Screen NONE DETECTED NONE DETECTED   Cocaine Metabolite,Ur [place] NONE DETECTED NONE DETECTED   Opiate, Ur Screen NONE DETECTED NONE DETECTED   Phencyclidine (PCP) Ur S NONE DETECTED NONE DETECTED   Cannabinoid 50 Ng, Ur [place] NONE DETECTED NONE DETECTED   Barbiturates, Ur Screen NONE DETECTED NONE DETECTED   Benzodiazepine, Ur Scrn NONE DETECTED NONE DETECTED   Methadone Scn, Ur NONE DETECTED NONE DETECTED    Comment: (NOTE) 568  Tricyclics, urine               Cutoff 1000 ng/mL 200  Amphetamines, urine             Cutoff 1000 ng/mL 300  MDMA (Ecstasy), urine           Cutoff 500 ng/mL 400  Cocaine Metabolite, urine  Cutoff 300 ng/mL 500  Opiate, urine                   Cutoff 300 ng/mL 600  Phencyclidine (PCP), urine      Cutoff 25 ng/mL 700  Cannabinoid, urine              Cutoff 50 ng/mL 800  Barbiturates, urine             Cutoff 200 ng/mL 900  Benzodiazepine, urine           Cutoff 200 ng/mL 1000 Methadone, urine                Cutoff 300 ng/mL 1100 1200 The urine drug screen provides only a preliminary, unconfirmed 1300 analytical test result and should not be used for non-medical 1400 purposes. Clinical consideration and professional judgment should 1500 be applied to any positive drug screen result due to possible 1600 interfering substances. A more specific alternate chemical method 1700 must be used in order to obtain a confirmed analytical result.  1800 Gas chromato graphy / mass spectrometry (GC/MS) is the preferred 1900 confirmatory method.   Glucose, capillary     Status: Abnormal   Collection Time: 06/08/16 11:46 AM  Result Value Ref Range   Glucose-Capillary 220 (H) 65 - 99 mg/dL    Current Facility-Administered Medications  Medication Dose Route Frequency Provider Last Rate Last Dose  .  acetaminophen (TYLENOL) tablet 650 mg  650 mg Oral Q6H PRN Lance Coon, MD   650 mg at 06/08/16 1050   Or  . acetaminophen (TYLENOL) suppository 650 mg  650 mg Rectal Q6H PRN Lance Coon, MD      . aspirin EC tablet 81 mg  81 mg Oral Daily Lance Coon, MD   81 mg at 06/08/16 0919  . atorvastatin (LIPITOR) tablet 20 mg  20 mg Oral q1800 Lance Coon, MD      . chlorthalidone (HYGROTON) tablet 25 mg  25 mg Oral Daily Fritzi Mandes, MD   25 mg at 06/08/16 0919  . cloNIDine (CATAPRES) tablet 0.2 mg  0.2 mg Oral BID Lance Coon, MD   0.2 mg at 06/08/16 3244  . clopidogrel (PLAVIX) tablet 75 mg  75 mg Oral Daily Lance Coon, MD   75 mg at 06/08/16 0919  . enoxaparin (LOVENOX) injection 40 mg  40 mg Subcutaneous Q24H Lance Coon, MD      . hydrALAZINE (APRESOLINE) injection 10 mg  10 mg Intravenous Q4H PRN Lance Coon, MD      . insulin aspart (novoLOG) injection 0-9 Units  0-9 Units Subcutaneous TID WC Lenis Noon, RPH      . lisinopril (PRINIVIL,ZESTRIL) tablet 10 mg  10 mg Oral Daily Fritzi Mandes, MD   10 mg at 06/08/16 0919  . metFORMIN (GLUCOPHAGE) tablet 500 mg  500 mg Oral BID WC Fritzi Mandes, MD   500 mg at 06/08/16 0919  . nitroGLYCERIN (NITROSTAT) SL tablet 0.4 mg  0.4 mg Sublingual Q5 min PRN Fritzi Mandes, MD      . ondansetron [facility]) tablet 4 mg  4 mg Oral Q6H PRN Lance Coon, MD       Or  . ondansetron [facility]) injection 4 mg  4 mg Intravenous Q6H PRN Lance Coon, MD      . pantoprazole (PROTONIX) EC tablet 40 mg  40 mg Oral Daily Lance Coon, MD   40 mg at 06/08/16 0919  . sertraline (ZOLOFT) tablet 50 mg  50  mg Oral Daily Fritzi Mandes, MD   50 mg at 06/08/16 0919  . traZODone (DESYREL) tablet 150 mg  150 mg Oral QHS Fritzi Mandes, MD        Musculoskeletal: Strength & Muscle Tone: within normal limits Gait & Station: normal Patient leans: N/A  Psychiatric Specialty Exam: Physical Exam  Nursing note and vitals reviewed. Constitutional: He appears well-developed  and well-nourished.  HENT:  Head: Normocephalic and atraumatic.  Eyes: Conjunctivae are normal. Pupils are equal, round, and reactive to light.  Neck: Normal range of motion.  Cardiovascular: Normal rate and normal heart sounds.   Respiratory: Effort normal and breath sounds normal. No respiratory distress.  GI: Soft.  Musculoskeletal: Normal range of motion.  Neurological: He is alert.  Skin: Skin is warm and dry.  Psychiatric: He has a normal mood and affect. His behavior is normal. Judgment and thought content normal.    Review of Systems  Constitutional: Negative.   HENT: Negative.   Eyes: Negative.   Respiratory: Negative.   Cardiovascular: Negative.   Gastrointestinal: Negative.   Musculoskeletal: Negative.   Skin: Negative.   Neurological: Negative.   Psychiatric/Behavioral: Negative.     Blood pressure (!) 159/110, pulse 70, temperature 98.4 F (36.9 C), temperature source Oral, resp. rate 18, height _0  (1.753 m), weight 113.1 kg (249 lb 6.4 oz), SpO2 97 %.Body mass index is 36.83 kg/m.  General Appearance: Casual  Eye Contact:  Good  Speech:  Slow  Volume:  Decreased  Mood:  Euthymic  Affect:  Constricted  Thought Process:  Goal Directed  Orientation:  Full (Time, Place, and Person)  Thought Content:  Logical  Suicidal Thoughts:  No  Homicidal Thoughts:  No  Memory:  Immediate;   Good Recent;   Fair Remote;   Fair  Judgement:  Fair  Insight:  Fair  Psychomotor Activity:  Normal  Concentration:  Concentration: Fair  Recall:  [employer] of Knowledge:  Fair  Language:  Fair  Akathisia:  No  Handed:  Right  AIMS (if indicated):     Assets:  Desire for Improvement Financial Resources/Insurance Housing Resilience Social Support  ADL's:  Intact  Cognition:  Impaired,  Mild  Sleep:        Treatment Plan Summary: Plan 56 year old man with chronic irritability moodiness some problems with social adjustment impulsivity related to brain injury and  chronic alcohol abuse. Patient is not suffering from major depression from mania from psychosis from bipolar disorder or other illness that would clearly respond to a specific medication. There is no indication for making any changes right now to medicine. Patient would benefit most from assistance in being compliant with his medications, some support in getting to a supervised independent level of living and making a commitment to stay off of alcohol. No need to change any medications at this point. Does not meet commitment criteria. Patient is going to be discharged today and can follow up with the people from [facility] [place] and [facility].  Disposition: Patient does not meet criteria for psychiatric inpatient admission. Supportive therapy provided about ongoing stressors. Discussed crisis plan, support from social network, calling 911, coming to the Emergency Department, and calling Suicide Hotline.  Alethia Berthold, MD 06/08/2016 11:54 AM

## 2016-06-08 NOTE — Progress Notes (Signed)
*  PRELIMINARY RESULTS* Echocardiogram 2D Echocardiogram has been performed.  [name] [name] 06/08/2016, 11:27 AM

## 2016-06-08 NOTE — Progress Notes (Signed)
Pt admitted to 2A RM234. Oriented to room/floor and safety contract discussed and signed. Admission BP 172/119, parameters to keep systolic BP <180. Pt given sandwich tray and informed that he is to be NPO after midnight. Pt given Tylenol for HA and is now resting comfortably in room with call bell in reach. Nursing will continue to assess.

## 2016-06-08 NOTE — Clinical Social Work Note (Signed)
Patient to discharge to his group home today via taxi. The CSW has contacted the group home; they can receive the patient today, but they are unable to provide transportation due to lack of available staff. CSW provided taxi voucher as the patient is unable to afford a taxi and the family is not answering in order to discuss family transportation. Packet delivered to chart. Documentation sent to the facility. CSW will continue to follow pending additional discharge needs.  Argentina PonderKaren Martha Guthrie Lemme, MSW, Theresia MajorsLCSWA [phone]

## 2016-06-08 NOTE — Clinical Social Work Note (Signed)
Clinical Social Work Assessment  Patient Details  Name: [name] LimLinnies Lalor Jr. MRN: [id number] Date of Birth: February 28, 1960  Date of referral:  06/08/16               Reason for consult:  Facility Placement                Permission sought to share information with:  Facility Industrial/product designerContact Representative Permission granted to share information::  Yes, Verbal Permission Granted  Name::        Agency::     Relationship::     Contact Information:     Housing/Transportation Living arrangements for the past 2 months:  Group Home Source of Information:  Medical Team, Facility Patient Interpreter Needed:  None Criminal Activity/Legal Involvement Pertinent to Current Situation/Hospitalization:  No - Comment as needed Significant Relationships:  Merchandiser, retailCommunity Support Lives with:  Facility Resident Do you feel safe going back to the place where you live?  Yes Need for family participation in patient care:  No (Coment)  Care giving concerns:  Patient admitted from a group home   Social Worker assessment / plan:  The patient is a resident of [facility] [phone]). CSW contacted the group home to receive information for discharge planning. According to the group home representative, the patient can return to the group home once stable. She will have the main administrator call the CSW regarding the plan for transportation back to the group home (EMS v facility transportation). The CSW attempted to contact the patient's sister with no answer. The CSW left a voice mail. CSW will continue to follow for discharge planning.  Employment status:  Retired Health and safety inspectornsurance information:  [place]  No Follow Up Information / Referral to community resources:     Patient/Family's Response to care:  The group home representative thanked the CSW.  Patient/Family's Understanding of and Emotional Response to Diagnosis, Current Treatment, and Prognosis:  The CSW representative understands that the  patient is able to discharge back once stable and is in agreement.  Emotional Assessment Appearance:  Appears stated age Attitude/Demeanor/Rapport:  Apprehensive Affect (typically observed):  Apprehensive Orientation:  Oriented to Self, Oriented to Place, Oriented to  Time, Oriented to Situation Alcohol / Substance use:  Never Used Psych involvement (Current and /or in the community):  Yes (Comment)  Discharge Needs  Concerns to be addressed:  Care Coordination Readmission within the last 30 days:  No Current discharge risk:  None Barriers to Discharge:  No Barriers Identified   [name] CongKaren M Junella Domke, LCSW 06/08/2016, 11:32 AM
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
## 2016-06-08 NOTE — Clinical Social Work Note (Signed)
CSW received consult that patient is from a group home. Assessment to follow.  Argentina PonderKaren Martha Yerachmiel Spinney, MSW, Theresia MajorsLCSWA [phone]

## 2016-06-08 NOTE — Progress Notes (Signed)
Patient discharged per MD order and hospital protocol to group home. [facility] Allena KatzPatel. Patient's paperwork printed and placed in envelope for the group home by Corwin LevinsKaren SW. I printed his discharge paperwork as well.  No report necessary to be called to group home per Karen,SW.

## 2016-06-09 LAB — HEMOGLOBIN A1C
Hgb A1c MFr Bld: 6.6 % — ABNORMAL HIGH (ref 4.8–5.6)
Mean Plasma Glucose: 143 mg/dL

## 2016-09-20 ENCOUNTER — Emergency Department
Admission: EM | Admit: 2016-09-20 | Discharge: 2016-09-20 | Disposition: A | Discharge status: Home / Self Care | Payer: Medicaid Other | Attending: Emergency Medicine | Admitting: Emergency Medicine

## 2016-09-20 ENCOUNTER — Encounter: Payer: Self-pay | Admitting: Emergency Medicine

## 2016-09-20 DIAGNOSIS — I10 Essential (primary) hypertension: Secondary | ICD-10-CM | POA: Insufficient documentation

## 2016-09-20 DIAGNOSIS — Z87891 Personal history of nicotine dependence: Secondary | ICD-10-CM | POA: Insufficient documentation

## 2016-09-20 DIAGNOSIS — I2510 Atherosclerotic heart disease of native coronary artery without angina pectoris: Secondary | ICD-10-CM | POA: Diagnosis not present

## 2016-09-20 DIAGNOSIS — M545 Low back pain, unspecified: Secondary | ICD-10-CM

## 2016-09-20 DIAGNOSIS — Z7902 Long term (current) use of antithrombotics/antiplatelets: Secondary | ICD-10-CM | POA: Diagnosis not present

## 2016-09-20 DIAGNOSIS — Z7982 Long term (current) use of aspirin: Secondary | ICD-10-CM | POA: Insufficient documentation

## 2016-09-20 DIAGNOSIS — Z79899 Other long term (current) drug therapy: Secondary | ICD-10-CM | POA: Diagnosis not present

## 2016-09-20 DIAGNOSIS — E119 Type 2 diabetes mellitus without complications: Secondary | ICD-10-CM | POA: Insufficient documentation

## 2016-09-20 DIAGNOSIS — Z794 Long term (current) use of insulin: Secondary | ICD-10-CM | POA: Insufficient documentation

## 2016-09-20 MED ORDER — NAPROXEN 500 MG PO TABS: 500.0000 mg | ORAL_TABLET | Freq: Two times a day (BID) | ORAL | 2 refills | Status: DC

## 2016-09-20 MED ORDER — KETOROLAC TROMETHAMINE 30 MG/ML IJ SOLN
30.0000 mg | Freq: Once | INTRAMUSCULAR | Status: AC
  Administered 2016-09-20: 30 mg via INTRAMUSCULAR
  Filled 2016-09-20: qty 1

## 2016-09-20 NOTE — ED Triage Notes (Signed)
Pt to ED by EMS from [facility] with c/o of lower back pain that started yesterday and radiates down his right leg.

## 2016-09-20 NOTE — ED Notes (Signed)
MD Kinner at bedside  

## 2016-09-20 NOTE — ED Notes (Signed)
Pt did not take his medications today to include BP medication.

## 2016-09-20 NOTE — ED Provider Notes (Signed)
[facility] Emergency Department Provider Note   ____________________________________________    I have reviewed the triage vital signs and the nursing notes.   HISTORY  Chief Complaint Back Pain     HPI [name] [name]. is a 56 y.o. male who presents with complaints of right sided lower back pain he reports has started 2 days ago. He complains of moderate pain which becomes more severe with movement. He reports it becomes more painful with ambulation. He denies difficulty urinating or numbness or weakness. He has taken Tylenol for the pain with little improvement. Denies trauma to the area. No abdominal pain. Patient has significant medical history as noted below.   Past Medical History:  Diagnosis Date  . Aortic stenosis, mild February 2015  . Auditory hallucinations   . Bipolar disorder (HCC)   . Coronary artery disease    No reported catheterization. Negative Myoview stress test and 2013.  Marland Kitchen Depression   . Diabetes mellitus   . GERD (gastroesophageal reflux disease)   . History of suicide attempt   . Hx of medication noncompliance   . Hypertension   . Hypertensive hypertrophic cardiomyopathy [facility]) February 2015   Severe concentric LVH on echocardiogram.  . Obesity, Class II, BMI 35-39.9, with comorbidity   . Pericarditis   . Polysubstance abuse    cocaine, marijuana  . Stroke [facility]) February 2015   Right MCA aneurysm; head MRI - acute infarct of the right frontal lobe. Smaller acute infarct of the left frontal lobe. Thought to be due to cerebral emboli. Left PICA infarct. Small chronic lacunar infarct right internal capsule. Chronic microvascular ischemic change with white matter change.  . Syncope and collapse     Patient Active Problem List   Diagnosis Date Noted  . Accelerated hypertension 06/07/2016  . GERD (gastroesophageal reflux disease) 06/07/2016  . Bipolar affective disorder (HCC) 06/07/2016  . Chest pain 06/07/2016  .  Unspecified Depressive disorder 04/17/2016  . 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 Tobacco use disorder 11/07/2014  . Stimulant use disorder (HCC) crack cocaine 11/07/2014  . Alcohol use disorder, moderate, dependence (HCC) 11/07/2014  . CAD (coronary artery disease) 11/05/2014  . Cerebral infarction (HCC) 10/19/2014  . LVH (left ventricular hypertrophy) 05/20/2013  . Obesity (BMI 30-39.9) 04/10/2013  . Aortic stenosis, mild 03/07/2013  . DM (diabetes mellitus), type 2, uncontrolled (HCC) 01/05/2013  . Essential hypertension, malignant 01/25/2009    Past Surgical History:  Procedure Laterality Date  . Carotid Dopplers:  03/23/2013   Interval thickening of bilateral common carotid no significant plaque formation. Tortuosity but no significant stenosis. Patent bilateral vertebral  arteries  . NM MYOVIEW LTD  July 2013   No ischemia or infarct. There is inferior wall defec suggestive of diaphragmatic attenuation. Mild LV dilation at to be due to hypertensive heart disease. EF 55%. Poor exercise tolerance.  Marland Kitchen PERICARDIAL FLUID DRAINAGE    . TRANSTHORACIC ECHOCARDIOGRAM  03/23/2013   EF 5560%. Elevated left atrial pressure. Severe concentric LVH. Mild aortic stenosis. -- Appearance of hypertensive hypertrophic heart disease.  No clear cardioembolic source    Prior to Admission medications   Medication Sig Start Date End Date Taking? Authorizing Provider  amLODipine (NORVASC) 10 MG tablet Take 1 tablet (10 mg total) by mouth daily. 04/17/16  Yes Jimmy Footman, MD  aspirin 81 MG EC tablet Take 1 tablet (81 mg total) by mouth daily. 04/17/16  Yes Jimmy Footman, MD  atorvastatin (LIPITOR) 20 MG tablet  Take 1 tablet (20 mg total) by mouth daily at 6 PM. 04/17/16  Yes Jimmy Footman, MD  chlorthalidone (HYGROTON) 25 MG tablet Take 1 tablet (25 mg total) by mouth daily. 04/17/16  Yes Jimmy Footman, MD  clopidogrel  (PLAVIX) 75 MG tablet Take 1 tablet (75 mg total) by mouth daily. 04/17/16  Yes Jimmy Footman, MD  insulin aspart (NOVOLOG) 100 UNIT/ML FlexPen Inject 10 Units into the skin 3 (three) times daily with meals. 07/26/13  Yes [provider]  metFORMIN (GLUCOPHAGE) 500 MG tablet Take 1 tablet (500 mg total) by mouth 2 (two) times daily with a meal. 04/17/16  Yes Jimmy Footman, MD  traZODone (DESYREL) 150 MG tablet Take 1 tablet (150 mg total) by mouth at bedtime. 04/17/16  Yes Jimmy Footman, MD  cloNIDine (CATAPRES) 0.2 MG tablet Take 1 tablet (0.2 mg total) by mouth 2 (two) times daily. Patient not taking: Reported on 06/07/2016 04/17/16   Jimmy Footman, MD  lisinopril (PRINIVIL,ZESTRIL) 10 MG tablet Take 1 tablet (10 mg total) by mouth daily. Patient not taking: Reported on 09/20/2016 06/09/16   Enedina Finner, MD  naproxen (NAPROSYN) 500 MG tablet Take 1 tablet (500 mg total) by mouth 2 (two) times daily with a meal. 09/20/16   Jene Every, MD  nitroGLYCERIN (NITROSTAT) 0.4 MG SL tablet Place 1 tablet (0.4 mg total) under the tongue every 5 (five) minutes as needed for chest pain. Patient not taking: Reported on 06/07/2016 04/17/16   Jimmy Footman, MD  pantoprazole (PROTONIX) 40 MG tablet Take 1 tablet (40 mg total) by mouth daily. Patient not taking: Reported on 06/07/2016 04/17/16   Jimmy Footman, MD  sertraline (ZOLOFT) 50 MG tablet Take 1 tablet (50 mg total) by mouth daily. Patient not taking: Reported on 06/07/2016 04/17/16   Jimmy Footman, MD     Allergies Patient has no known allergies.  Family History  Problem Relation Age of Onset  . Stroke Mother   . Diabetes type II Mother   . Hypertension Mother   . Hypertension Father   . Arthritis Father   . Arthritis Sister   . Diabetes type II Sister   . Hypertension Sister     Social History Social History  Substance Use Topics  . Smoking status: Former  Smoker    Packs/day: 0.50    Years: 40.00    Types: Cigars  . Smokeless tobacco: Never Used  . Alcohol use No     Comment: occasionally; reports 1 pint of vodka per month at paydaysocially    Review of Systems  Constitutional: No fever/chills Eyes: No visual changes.  ENT: No Neck pain Cardiovascular: Denies chest pain. Respiratory: Denies shortness of breath. Gastrointestinal: No abdominal pain.  No nausea, no vomiting.   Genitourinary: No incontinence or difficulty urinating Musculoskeletal: As above Skin: Negative for rash. Neurological: Negative forweakness   ____________________________________________   PHYSICAL EXAM:  VITAL SIGNS: ED Triage Vitals  Enc Vitals Group     BP 09/20/16 0802 (!) 174/116     Pulse Rate 09/20/16 0802 80     Resp 09/20/16 0802 18     Temp 09/20/16 0802 98.2 F (36.8 C)     Temp Source 09/20/16 0802 Oral     SpO2 09/20/16 0802 98 %     Weight 09/20/16 0803 102.1 kg (225 lb)     Height 09/20/16 0803 1.803 m (5\' 11" )     Head Circumference --      Peak Flow --  Pain Score 09/20/16 0802 10     Pain Loc --      Pain Edu? --      Excl. in GC? --     Constitutional: Alert and oriented. No acute distress. Pleasant and interactive Eyes: Conjunctivae are normal.   Nose: No congestion/rhinnorhea. Mouth/Throat: Mucous membranes are moist.    Cardiovascular: Normal rate, regular rhythm. Grossly normal heart sounds.  Good peripheral circulation. Respiratory: Normal respiratory effort.  No retractions. Lungs CTAB. Gastrointestinal: Soft and nontender. No distention.  No CVA tenderness. Genitourinary: deferred Musculoskeletal: No vertebral tenderness to palpation, mild tenderness to palpation right lower paraspinal muscle is consistent with muscle spasm. No lower extremity tenderness nor edema.  Warm and well perfused. Normal strength in both lower extremities. No saddle anesthesia Neurologic:  Normal speech and language. No gross focal  neurologic deficits are appreciated.  Skin:  Skin is warm, dry and intact. No rash noted. Psychiatric: Mood and affect are normal. Speech and behavior are normal.  ____________________________________________   LABS (all labs ordered are listed, but only abnormal results are displayed)  Labs Reviewed - No data to display ____________________________________________  EKG  None ____________________________________________  RADIOLOGY  None ____________________________________________   PROCEDURES  Procedure(s) performed: No    Critical Care performed: No ____________________________________________   INITIAL IMPRESSION / ASSESSMENT AND PLAN / ED COURSE  Pertinent labs & imaging results that were available during my care of the patient were reviewed by me and considered in my medical decision making (see chart for details).  Patient well-appearing in no acute distress. His back pain does seem to be consistent with muscle spasm. No concerning symptoms or findings on exam. We will treat with IM Toradol and reevaluate  ----------------------------------------- 9:49 AM on 09/20/2016 -----------------------------------------  Patient sitting on the edge of the bed, he reports he feels significant better. He is asking for something to eat. Given his improvement I feel he is appropriate for discharge. Patient is chronically hypertensive and poorly controlled I will have him  follow-up with his PCP    ____________________________________________   FINAL CLINICAL IMPRESSION(S) / ED DIAGNOSES  Final diagnoses:  Acute right-sided low back pain without sciatica      NEW MEDICATIONS STARTED DURING THIS VISIT:  New Prescriptions   NAPROXEN (NAPROSYN) 500 MG TABLET    Take 1 tablet (500 mg total) by mouth 2 (two) times daily with a meal.     Note:  This document was prepared using Dragon voice recognition software and may include unintentional dictation errors.      Jene Every, MD 09/20/16 [phone]

## 2016-09-20 NOTE — ED Notes (Signed)
Pt verbalized understanding of discharge instructions. NAD at this time. 

## 2016-09-20 NOTE — ED Notes (Signed)
MD Kinner aware of BP at discharge. Pt has not taken his medication this morning. Pt's BP trend has remained the same since arrival.

## 2016-09-24 ENCOUNTER — Emergency Department: Payer: Medicaid Other

## 2016-09-24 ENCOUNTER — Encounter: Payer: Self-pay | Admitting: Emergency Medicine

## 2016-09-24 ENCOUNTER — Emergency Department
Admission: EM | Admit: 2016-09-24 | Discharge: 2016-09-24 | Disposition: A | Discharge status: Home / Self Care | Payer: Medicaid Other | Attending: Emergency Medicine | Admitting: Emergency Medicine

## 2016-09-24 DIAGNOSIS — R0602 Shortness of breath: Secondary | ICD-10-CM | POA: Diagnosis not present

## 2016-09-24 DIAGNOSIS — Z7984 Long term (current) use of oral hypoglycemic drugs: Secondary | ICD-10-CM | POA: Insufficient documentation

## 2016-09-24 DIAGNOSIS — I359 Nonrheumatic aortic valve disorder, unspecified: Secondary | ICD-10-CM | POA: Insufficient documentation

## 2016-09-24 DIAGNOSIS — M5431 Sciatica, right side: Secondary | ICD-10-CM | POA: Diagnosis not present

## 2016-09-24 DIAGNOSIS — I10 Essential (primary) hypertension: Secondary | ICD-10-CM | POA: Diagnosis not present

## 2016-09-24 DIAGNOSIS — M545 Low back pain: Secondary | ICD-10-CM | POA: Diagnosis not present

## 2016-09-24 DIAGNOSIS — E119 Type 2 diabetes mellitus without complications: Secondary | ICD-10-CM | POA: Diagnosis not present

## 2016-09-24 DIAGNOSIS — R079 Chest pain, unspecified: Secondary | ICD-10-CM | POA: Diagnosis present

## 2016-09-24 DIAGNOSIS — Z87891 Personal history of nicotine dependence: Secondary | ICD-10-CM | POA: Diagnosis not present

## 2016-09-24 DIAGNOSIS — Z79899 Other long term (current) drug therapy: Secondary | ICD-10-CM | POA: Insufficient documentation

## 2016-09-24 DIAGNOSIS — Z794 Long term (current) use of insulin: Secondary | ICD-10-CM | POA: Diagnosis not present

## 2016-09-24 DIAGNOSIS — I259 Chronic ischemic heart disease, unspecified: Secondary | ICD-10-CM | POA: Diagnosis not present

## 2016-09-24 DIAGNOSIS — R0789 Other chest pain: Secondary | ICD-10-CM | POA: Diagnosis not present

## 2016-09-24 LAB — CBC WITH DIFFERENTIAL/PLATELET
Basophils Absolute: 0.1 10*3/uL (ref 0–0.1)
Basophils Relative: 1 %
EOS ABS: 0.5 10*3/uL (ref 0–0.7)
EOS PCT: 5 %
HCT: 45.7 % (ref 40.0–52.0)
Hemoglobin: 16 g/dL (ref 13.0–18.0)
LYMPHS ABS: 2.7 10*3/uL (ref 1.0–3.6)
Lymphocytes Relative: 26 %
MCH: 31.7 pg (ref 26.0–34.0)
MCHC: 35 g/dL (ref 32.0–36.0)
MCV: 90.7 fL (ref 80.0–100.0)
Monocytes Absolute: 1 10*3/uL (ref 0.2–1.0)
Monocytes Relative: 9 %
Neutro Abs: 6.3 10*3/uL (ref 1.4–6.5)
Neutrophils Relative %: 59 %
PLATELETS: 197 10*3/uL (ref 150–440)
RBC: 5.04 MIL/uL (ref 4.40–5.90)
RDW: 12.5 % (ref 11.5–14.5)
WBC: 10.6 10*3/uL (ref 3.8–10.6)

## 2016-09-24 LAB — BASIC METABOLIC PANEL
Anion gap: 7 (ref 5–15)
BUN: 18 mg/dL (ref 6–20)
CALCIUM: 9.3 mg/dL (ref 8.9–10.3)
CO2: 28 mmol/L (ref 22–32)
Chloride: 103 mmol/L (ref 101–111)
Creatinine, Ser: 1.31 mg/dL — ABNORMAL HIGH (ref 0.61–1.24)
GFR calc Af Amer: >60 mL/min (ref >60)
GFR calc non Af Amer: 59 mL/min — ABNORMAL LOW (ref >60)
GLUCOSE: 225 mg/dL — AB (ref 65–99)
Potassium: 3.9 mmol/L (ref 3.5–5.1)
Sodium: 138 mmol/L (ref 135–145)

## 2016-09-24 LAB — HEPATIC FUNCTION PANEL
ALT: 23 U/L (ref 17–63)
AST: 19 U/L (ref 15–41)
Albumin: 4 g/dL (ref 3.5–5.0)
Alkaline Phosphatase: 55 U/L (ref 38–126)
BILIRUBIN DIRECT: 0.1 mg/dL (ref 0.1–0.5)
BILIRUBIN INDIRECT: 0.9 mg/dL (ref 0.3–0.9)
Total Bilirubin: 1 mg/dL (ref 0.3–1.2)
Total Protein: 7.1 g/dL (ref 6.5–8.1)

## 2016-09-24 LAB — TROPONIN I
TROPONIN I: 0.04 ng/mL — AB (ref <0.03)
Troponin I: 0.04 ng/mL (ref <0.03)

## 2016-09-24 LAB — BRAIN NATRIURETIC PEPTIDE: B Natriuretic Peptide: 131 pg/mL — ABNORMAL HIGH (ref 0.0–100.0)

## 2016-09-24 MED ORDER — OXYCODONE-ACETAMINOPHEN 5-325 MG PO TABS: 1.0000 | ORAL_TABLET | Freq: Four times a day (QID) | ORAL | 0 refills | Status: DC | PRN

## 2016-09-24 MED ORDER — CLONIDINE HCL 0.1 MG PO TABS
0.2000 mg | ORAL_TABLET | Freq: Two times a day (BID) | ORAL | Status: DC
  Filled 2016-09-24: qty 2

## 2016-09-24 MED ORDER — AMLODIPINE BESYLATE 5 MG PO TABS
10.0000 mg | ORAL_TABLET | Freq: Every day | ORAL | Status: DC
  Filled 2016-09-24: qty 2

## 2016-09-24 MED ORDER — CLONIDINE HCL 0.1 MG PO TABS
0.2000 mg | ORAL_TABLET | Freq: Once | ORAL | Status: AC
  Administered 2016-09-24: 0.2 mg via ORAL

## 2016-09-24 MED ORDER — AMLODIPINE BESYLATE 5 MG PO TABS
10.0000 mg | ORAL_TABLET | Freq: Once | ORAL | Status: AC
  Administered 2016-09-24: 10 mg via ORAL

## 2016-09-24 MED ORDER — OXYCODONE-ACETAMINOPHEN 5-325 MG PO TABS
2.0000 | ORAL_TABLET | Freq: Once | ORAL | Status: AC
  Administered 2016-09-24: 2 via ORAL
  Filled 2016-09-24: qty 2

## 2016-09-24 NOTE — ED Provider Notes (Signed)
[facility] Emergency Department Provider Note  ____________________________________________   First MD Initiated Contact with Patient 09/24/16 [phone]     (approximate)  I have reviewed the triage vital signs and the nursing notes.   HISTORY  Chief Complaint Chest Pain    HPI [name] Yeshaya Vath. is a 56 y.o. male who comes to the emergency department via EMS with chest pain. His pain began early this morning around 3 in the morning and awoke him from sleep. EMS gave him 324 mg of aspirin and nitroglycerin 2 in route. Nitroglycerin did not help his pain. He has a long-standing history of hypertension, hypertensive cardiomyopathy, coronary artery disease, and aortic stenosis. He said that his pain is sharp aching constant. Associated with some shortness of breath and worse with deep inspiration. Nonexertional. He has never had a stent before. His other concern is severe 10 out of 10 right low back pain for the past 4 days. He was seen in our emergency department at that time and was prescribed naproxen with no relief.He has a family history of hypertension.  Cardiac catheterization from 05/13/2013: No severe coronary artery disease, possible mild to moderate ostial ramus disease. Normal ejection fraction 55% no significant aortic stenosis or regurgitation no intervention needed. Chest pain likely noncardiac in nature.  06/08/16 Echo: - Left ventricle: Wall thickness was increased in a pattern of   moderate LVH. Systolic function was mildly reduced. The estimated   ejection fraction was in the range of 45% to 50%. Doppler   parameters are consistent with abnormal left ventricular   relaxation (grade 1 diastolic dysfunction). - Aortic valve: Valve area (Vmax): 2.45 cm^2. - Mitral valve: There was mild regurgitation.  Past Medical History:  Diagnosis Date  . Aortic stenosis, mild February 2015  . Auditory hallucinations   . Bipolar disorder (HCC)   . Coronary  artery disease    No reported catheterization. Negative Myoview stress test and 2013.  Marland Kitchen Depression   . Diabetes mellitus   . GERD (gastroesophageal reflux disease)   . History of suicide attempt   . Hx of medication noncompliance   . Hypertension   . Hypertensive hypertrophic cardiomyopathy [facility]) February 2015   Severe concentric LVH on echocardiogram.  . Obesity, Class II, BMI 35-39.9, with comorbidity   . Pericarditis   . Polysubstance abuse    cocaine, marijuana  . Stroke [facility]) February 2015   Right MCA aneurysm; head MRI - acute infarct of the right frontal lobe. Smaller acute infarct of the left frontal lobe. Thought to be due to cerebral emboli. Left PICA infarct. Small chronic lacunar infarct right internal capsule. Chronic microvascular ischemic change with white matter change.  . Syncope and collapse     Patient Active Problem List   Diagnosis Date Noted  . Accelerated hypertension 06/07/2016  . GERD (gastroesophageal reflux disease) 06/07/2016  . Bipolar affective disorder (HCC) 06/07/2016  . Chest pain 06/07/2016  . Unspecified Depressive disorder 04/17/2016  . 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 Tobacco use disorder 11/07/2014  . Stimulant use disorder (HCC) crack cocaine 11/07/2014  . Alcohol use disorder, moderate, dependence (HCC) 11/07/2014  . CAD (coronary artery disease) 11/05/2014  . Cerebral infarction (HCC) 10/19/2014  . LVH (left ventricular hypertrophy) 05/20/2013  . Obesity (BMI 30-39.9) 04/10/2013  . Aortic stenosis, mild 03/07/2013  . DM (diabetes mellitus), type 2, uncontrolled (HCC) 01/05/2013  . Essential hypertension, malignant 01/25/2009    Past Surgical History:  Procedure Laterality  Date  . Carotid Dopplers:  03/23/2013   Interval thickening of bilateral common carotid no significant plaque formation. Tortuosity but no significant stenosis. Patent bilateral vertebral  arteries  . NM MYOVIEW LTD  July  2013   No ischemia or infarct. There is inferior wall defec suggestive of diaphragmatic attenuation. Mild LV dilation at to be due to hypertensive heart disease. EF 55%. Poor exercise tolerance.  Marland Kitchen PERICARDIAL FLUID DRAINAGE    . TRANSTHORACIC ECHOCARDIOGRAM  03/23/2013   EF 5560%. Elevated left atrial pressure. Severe concentric LVH. Mild aortic stenosis. -- Appearance of hypertensive hypertrophic heart disease.  No clear cardioembolic source    Prior to Admission medications   Medication Sig Start Date End Date Taking? Authorizing Provider  amLODipine (NORVASC) 10 MG tablet Take 1 tablet (10 mg total) by mouth daily. 04/17/16  Yes Jimmy Footman, MD  aspirin 81 MG EC tablet Take 1 tablet (81 mg total) by mouth daily. 04/17/16  Yes Jimmy Footman, MD  atorvastatin (LIPITOR) 20 MG tablet Take 1 tablet (20 mg total) by mouth daily at 6 PM. 04/17/16  Yes Jimmy Footman, MD  chlorthalidone (HYGROTON) 25 MG tablet Take 1 tablet (25 mg total) by mouth daily. 04/17/16  Yes Jimmy Footman, MD  clopidogrel (PLAVIX) 75 MG tablet Take 1 tablet (75 mg total) by mouth daily. 04/17/16  Yes Jimmy Footman, MD  insulin aspart (NOVOLOG) 100 UNIT/ML FlexPen Inject 10 Units into the skin 3 (three) times daily with meals. 07/26/13  Yes [provider]  metFORMIN (GLUCOPHAGE) 500 MG tablet Take 1 tablet (500 mg total) by mouth 2 (two) times daily with a meal. 04/17/16  Yes Jimmy Footman, MD  naproxen (NAPROSYN) 500 MG tablet Take 1 tablet (500 mg total) by mouth 2 (two) times daily with a meal. 09/20/16  Yes Jene Every, MD  nitroGLYCERIN (NITROSTAT) 0.4 MG SL tablet Place 1 tablet (0.4 mg total) under the tongue every 5 (five) minutes as needed for chest pain. 04/17/16  Yes Jimmy Footman, MD  traZODone (DESYREL) 150 MG tablet Take 1 tablet (150 mg total) by mouth at bedtime. 04/17/16  Yes Jimmy Footman, MD  cloNIDine  (CATAPRES) 0.2 MG tablet Take 1 tablet (0.2 mg total) by mouth 2 (two) times daily. Patient not taking: Reported on 06/07/2016 04/17/16   Jimmy Footman, MD  lisinopril (PRINIVIL,ZESTRIL) 10 MG tablet Take 1 tablet (10 mg total) by mouth daily. Patient not taking: Reported on 09/20/2016 06/09/16   Enedina Finner, MD  oxyCODONE-acetaminophen (ROXICET) 5-325 MG tablet Take 1 tablet by mouth every 6 (six) hours as needed for severe pain. 09/24/16 09/24/17  Merrily Brittle, MD  pantoprazole (PROTONIX) 40 MG tablet Take 1 tablet (40 mg total) by mouth daily. Patient not taking: Reported on 06/07/2016 04/17/16   Jimmy Footman, MD  sertraline (ZOLOFT) 50 MG tablet Take 1 tablet (50 mg total) by mouth daily. Patient not taking: Reported on 06/07/2016 04/17/16   Jimmy Footman, MD    Allergies Patient has no known allergies.  Family History  Problem Relation Age of Onset  . Stroke Mother   . Diabetes type II Mother   . Hypertension Mother   . Hypertension Father   . Arthritis Father   . Arthritis Sister   . Diabetes type II Sister   . Hypertension Sister     Social History Social History  Substance Use Topics  . Smoking status: Former Smoker    Packs/day: 0.50    Years: 40.00    Types: Cigars  .  Smokeless tobacco: Never Used  . Alcohol use No     Comment: occasionally; reports 1 pint of vodka per month at paydaysocially    Review of Systems Constitutional: No fever/chills Eyes: No visual changes. ENT: No sore throat. Cardiovascular: Positive chest pain. Respiratory: Positive shortness of breath. Gastrointestinal: No abdominal pain.  No nausea, no vomiting.  No diarrhea.  No constipation. Genitourinary: Negative for dysuria. Musculoskeletal: Positive for back pain. Skin: Negative for rash. Neurological: Negative for headaches, focal weakness or numbness.   ____________________________________________   PHYSICAL EXAM:  VITAL SIGNS: ED Triage Vitals    Enc Vitals Group     BP      Pulse      Resp      Temp      Temp src      SpO2      Weight      Height      Head Circumference      Peak Flow      Pain Score      Pain Loc      Pain Edu?      Excl. in GC?     Constitutional: Alert and oriented 4 appears uncomfortable nontoxic no diaphoresis speaks in full clear sentences Eyes: PERRL EOMI. Head: Atraumatic. Nose: No congestion/rhinnorhea. Mouth/Throat: No trismus Neck: No stridor.   Cardiovascular: Normal rate, regular rhythm. Tibialis 6 systolic murmur heard best at the right sternal border able to lie completely flat with no jugular venous distention Respiratory: Normal respiratory effort.  No retractions. Lungs CTAB and moving good air Gastrointestinal: Soft nontender Musculoskeletal: 5 out of 5 hip flexion and extension plantar flexion dorsiflexion sensation intact to light touch throughout he is tender right low back with spasm lumbar Neurologic:  Normal speech and language. No gross focal neurologic deficits are appreciated. Skin:  Skin is warm, dry and intact. No rash noted. Psychiatric: Mood and affect are normal. Speech and behavior are normal.    ____________________________________________   DIFFERENTIAL includes but not limited to  Sciatica, muscle spasm, musculoskeletal back pain, acute coronary syndrome, congestive heart failure, pulmonary embolism ____________________________________________   LABS (all labs ordered are listed, but only abnormal results are displayed)  Labs Reviewed  BASIC METABOLIC PANEL - Abnormal; Notable for the following:       Result Value   Glucose, Bld 225 (*)    Creatinine, Ser 1.31 (*)    GFR calc non Af Amer 59 (*)    All other components within normal limits  BRAIN NATRIURETIC PEPTIDE - Abnormal; Notable for the following:    B Natriuretic Peptide 131.0 (*)    All other components within normal limits  TROPONIN I - Abnormal; Notable for the following:    Troponin I  0.04 (*)    All other components within normal limits  TROPONIN I - Abnormal; Notable for the following:    Troponin I 0.04 (*)    All other components within normal limits  HEPATIC FUNCTION PANEL  CBC WITH DIFFERENTIAL/PLATELET    Troponin stable likely secondary to stretch not primary cardiac ischemia __________________________________________  EKG  ED ECG REPORT I, Merrily Brittle, the attending physician, personally viewed and interpreted this ECG.  Date: 09/24/2016 EKG Time: 0748 Rate: 78 Rhythm: normal sinus rhythm QRS Axis: normal Intervals: normal ST/T Wave abnormalities: normal Narrative Interpretation: Normal sinus rhythm with left ventricular hypertrophy and repolarization abnormalities no signs of acute ischemia and no change from previous EKG in May of this year  ____________________________________________  RADIOLOGY  Chest x-ray with no acute disease ____________________________________________   PROCEDURES  Procedure(s) performed: no  Procedures  Critical Care performed: no  Observation: yes  ----------------------------------------- 7:46 AM on 09/24/2016 -----------------------------------------   OBSERVATION CARE: This patient is being placed under observation care for the following reasons: Chest pain with repeat testing to rule out ischemia    ____________________________________________   INITIAL IMPRESSION / ASSESSMENT AND PLAN / ED COURSE  Pertinent labs & imaging results that were available during my care of the patient were reviewed by me and considered in my medical decision making (see chart for details).  ----------------------------------------- 1000 on 09/24/2016 -----------------------------------------  Pain improved pending second troponin.     ----------------------------------------- 11:36 AM on 09/24/2016 -----------------------------------------   END OF OBSERVATION STATUS: After an appropriate period of  observation, this patient is being discharged due to the following reason(s):  The patient's second troponin is stable. His chest pain is atypical and his elevated troponin is likely secondary to chronic hypertension. I've encouraged him to make an appointment to see his cardiologist later on this week for recheck and have encouraged him to continue taking his antihypertensives.  ____________________________________________   FINAL CLINICAL IMPRESSION(S) / ED DIAGNOSES  Final diagnoses:  Atypical chest pain  Sciatica of right side      NEW MEDICATIONS STARTED DURING THIS VISIT:  Discharge Medication List as of 09/24/2016 12:41 PM       Note:  This document was prepared using Dragon voice recognition software and may include unintentional dictation errors.     Merrily Brittle, MD 09/25/16 [phone]

## 2016-09-24 NOTE — ED Triage Notes (Signed)
Pt to ED via EMS from home with c/o CP that started last night. Pt A&Ox4, pt received 324 Asprin and nitro x2 in route.

## 2016-09-24 NOTE — ED Notes (Signed)

## 2016-09-24 NOTE — Discharge Instructions (Signed)
Fortunately today your blood work was reassuring and your did not have a heart attack. Please make an appointment to follow-up with your cardiologist later on this week for reevaluation. Return to the emergency department for any concerns whatsoever.  It was a pleasure to take care of you today, and thank you for coming to our emergency department.  If you have any questions or concerns before leaving please ask the nurse to grab me and I'm more than happy to go through your aftercare instructions again.  If you were prescribed any opioid pain medication today such as Norco, Vicodin, Percocet, morphine, hydrocodone, or oxycodone please make sure you do not drive when you are taking this medication as it can alter your ability to drive safely.  If you have any concerns once you are home that you are not improving or are in fact getting worse before you can make it to your follow-up appointment, please do not hesitate to call [phone] and come back for further evaluation.  Merrily Brittle, MD  Results for orders placed or performed during the hospital encounter of 09/24/16  Basic metabolic panel  Result Value Ref Range   Sodium 138 135 - 145 mmol/L   Potassium 3.9 3.5 - 5.1 mmol/L   Chloride 103 101 - 111 mmol/L   CO2 28 22 - 32 mmol/L   Glucose, Bld 225 (H) 65 - 99 mg/dL   BUN 18 6 - 20 mg/dL   Creatinine, Ser 1.58 (H) 0.61 - 1.24 mg/dL   Calcium 9.3 8.9 - 30.9 mg/dL   GFR calc non Af Amer 59 (L) >60 mL/min   GFR calc Af Amer >60 >60 mL/min   Anion gap 7 5 - 15  Hepatic function panel  Result Value Ref Range   Total Protein 7.1 6.5 - 8.1 g/dL   Albumin 4.0 3.5 - 5.0 g/dL   AST 19 15 - 41 U/L   ALT 23 17 - 63 U/L   Alkaline Phosphatase 55 38 - 126 U/L   Total Bilirubin 1.0 0.3 - 1.2 mg/dL   Bilirubin, Direct 0.1 0.1 - 0.5 mg/dL   Indirect Bilirubin 0.9 0.3 - 0.9 mg/dL  Brain natriuretic peptide  Result Value Ref Range   B Natriuretic Peptide 131.0 (H) 0.0 - 100.0 pg/mL  Troponin I    Result Value Ref Range   Troponin I 0.04 (HH) <0.03 ng/mL  CBC with Differential  Result Value Ref Range   WBC 10.6 3.8 - 10.6 K/uL   RBC 5.04 4.40 - 5.90 MIL/uL   Hemoglobin 16.0 13.0 - 18.0 g/dL   HCT 40.7 68.0 - 88.1 %   MCV 90.7 80.0 - 100.0 fL   MCH 31.7 26.0 - 34.0 pg   MCHC 35.0 32.0 - 36.0 g/dL   RDW 10.3 15.9 - 45.8 %   Platelets 197 150 - 440 K/uL   Neutrophils Relative % 59 %   Neutro Abs 6.3 1.4 - 6.5 K/uL   Lymphocytes Relative 26 %   Lymphs Abs 2.7 1.0 - 3.6 K/uL   Monocytes Relative 9 %   Monocytes Absolute 1.0 0.2 - 1.0 K/uL   Eosinophils Relative 5 %   Eosinophils Absolute 0.5 0 - 0.7 K/uL   Basophils Relative 1 %   Basophils Absolute 0.1 0 - 0.1 K/uL  Troponin I  Result Value Ref Range   Troponin I 0.04 (HH) <0.03 ng/mL   Dg Chest Port 1 View  Result Date: 09/24/2016 CLINICAL DATA:  Chest pain for  2 days EXAM: PORTABLE CHEST 1 VIEW COMPARISON:  06/07/2016 FINDINGS: Cardiac shadow is not enlarged accentuated by the portable technique. The lungs are well aerated bilaterally. No focal infiltrate or sizable effusion is seen. No bony abnormality is noted. IMPRESSION: No acute abnormality seen. Electronically Signed   By: Alcide Clever M.D.   On: 09/24/2016 08:04

## 2016-09-24 NOTE — ED Notes (Signed)
Pt given sandwich tray at this time per request, MD aware

## 2016-09-24 NOTE — ED Notes (Signed)
MD aware of BP

## 2016-09-27 IMAGING — CR DG SHOULDER 3+V*R*
1 series · 3 of 3 positions shown · non-contrast
Comparison: None.

CLINICAL DATA: Right shoulder pain since a fall in the bathroom
yesterday afternoon.

EXAM:
DG SHOULDER 3+ VIEWS RIGHT

[Series 3: w shoulder grashey right · 0.14mm/px · 3 of 3 slices shown]
[im 1/3]
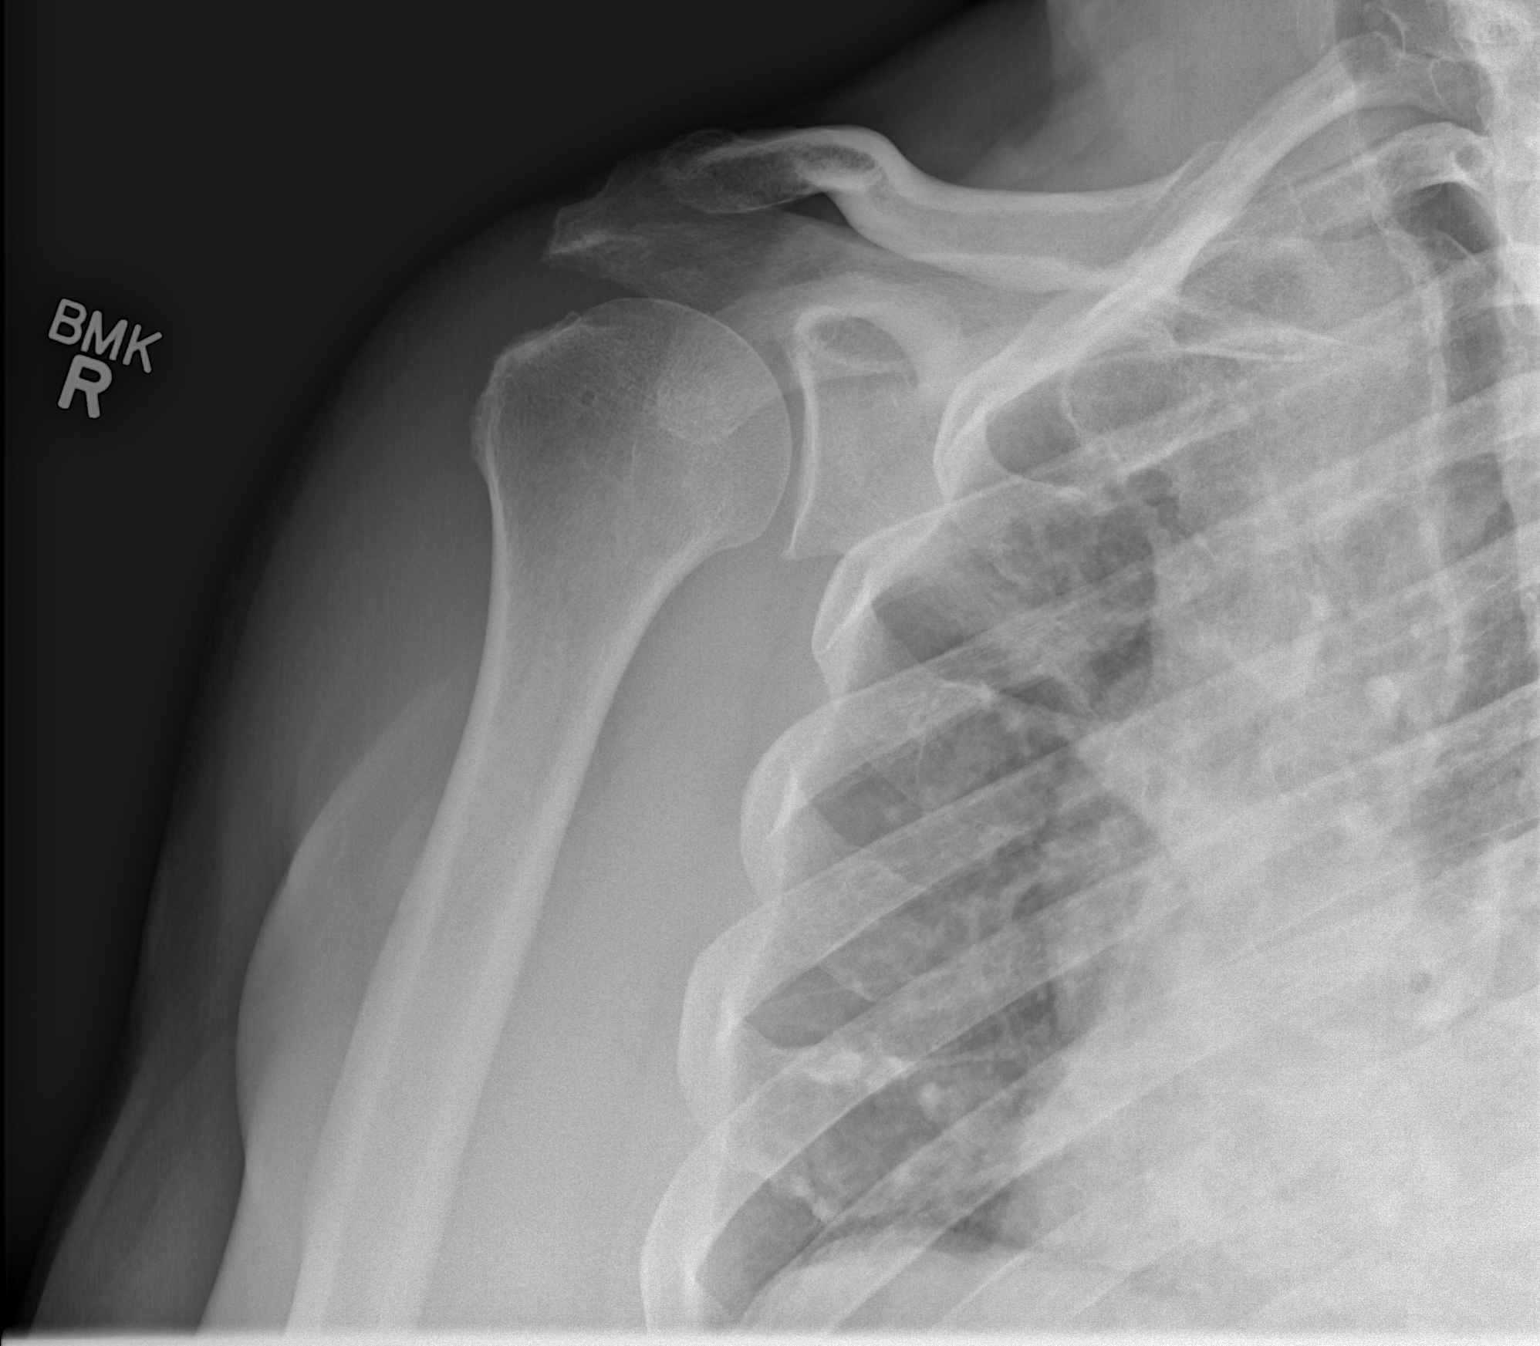
[im 2/3]
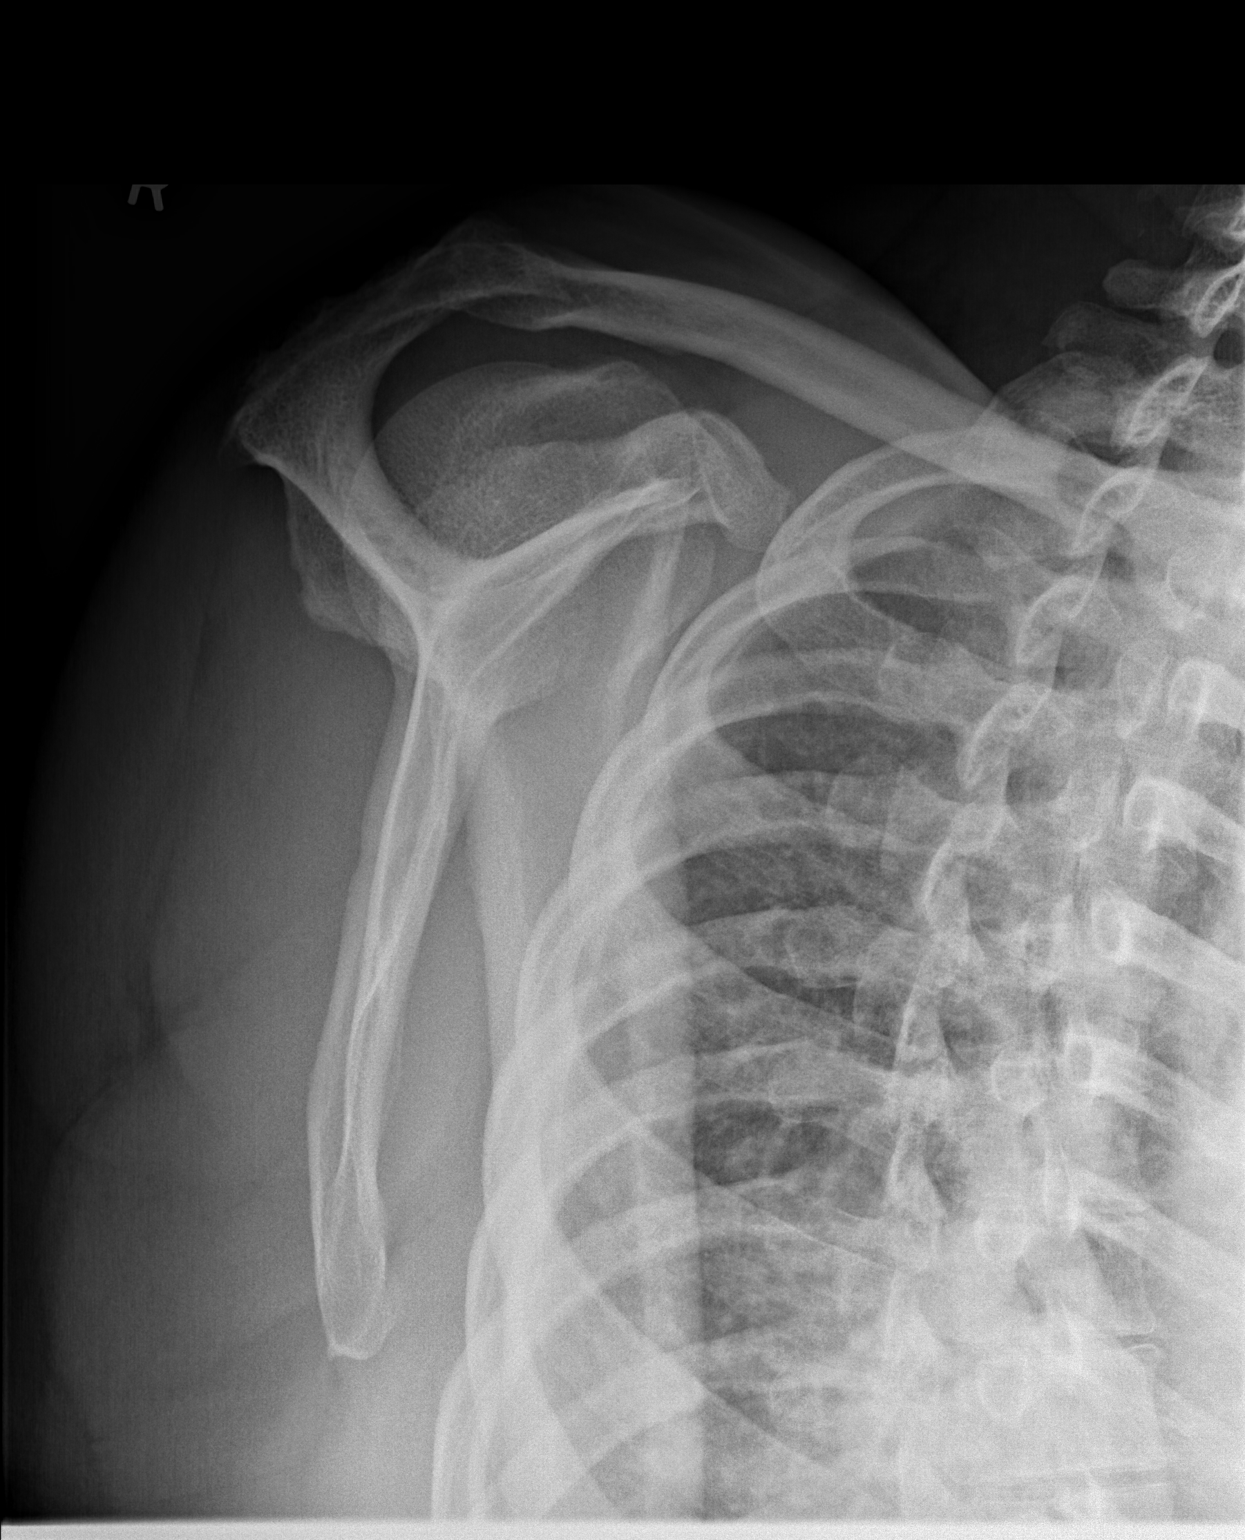
[im 3/3]
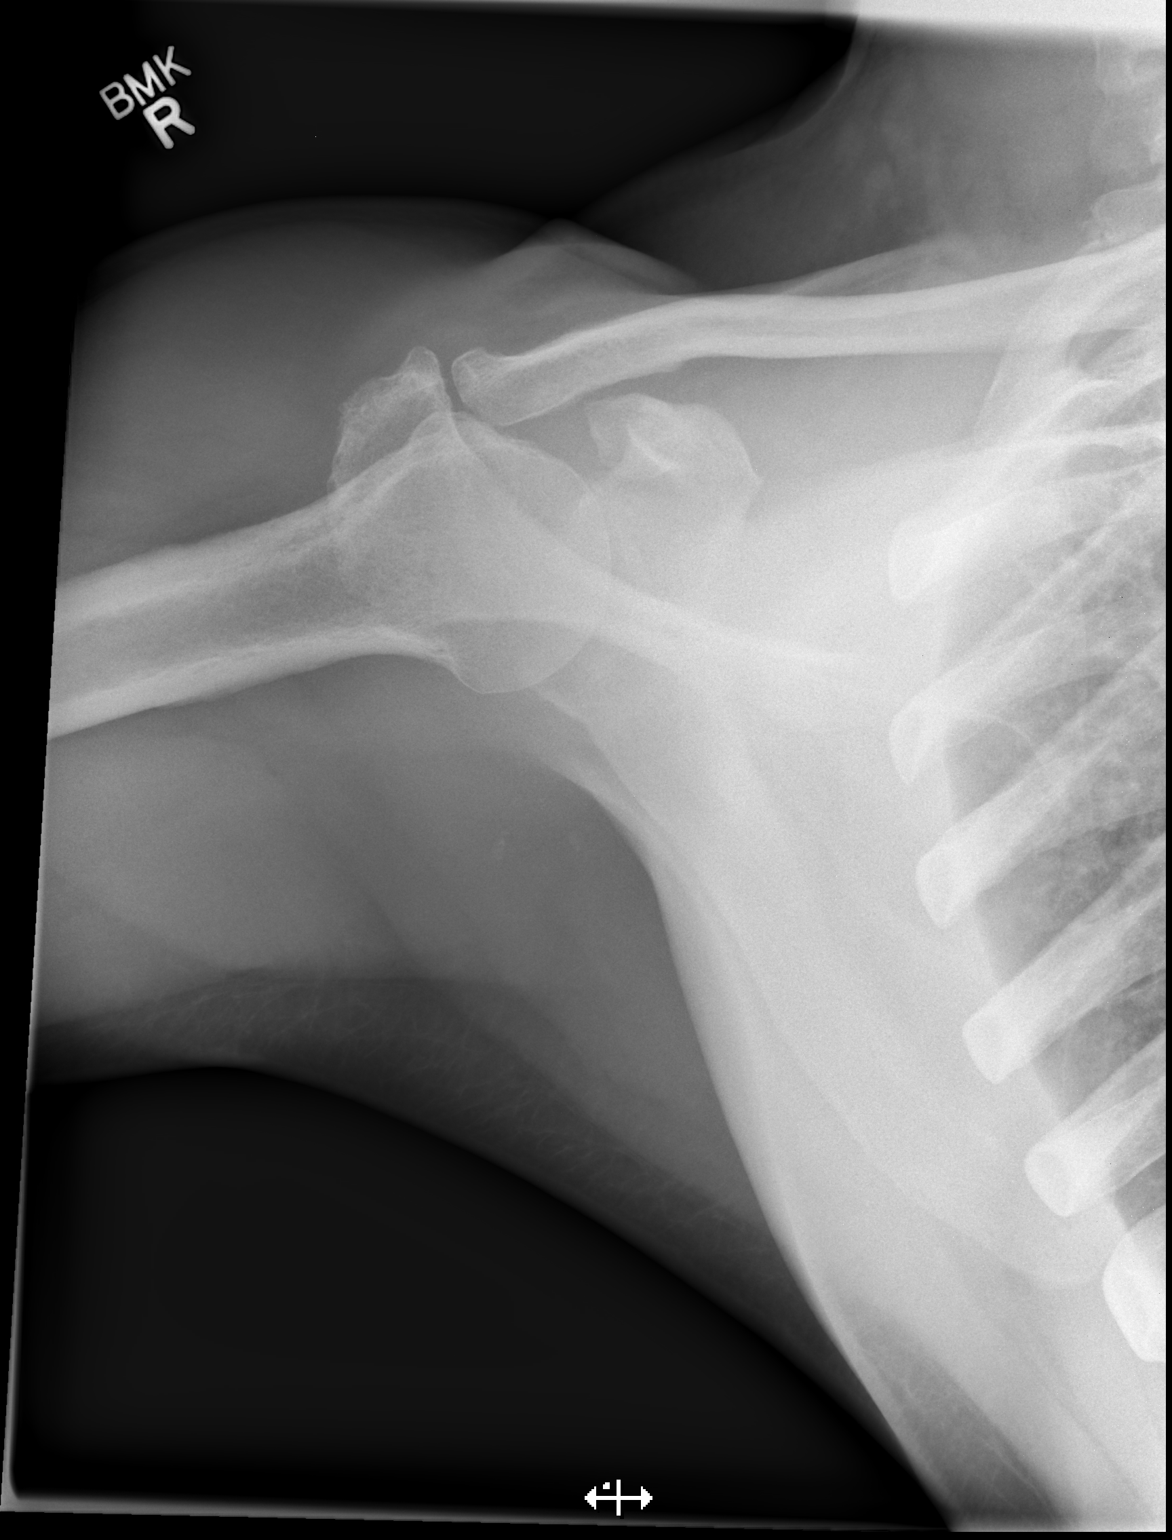

[3 of 3 positions shown; findings below may reference images not displayed]

FINDINGS: There is no evidence of fracture or dislocation. There is no
evidence of arthropathy or other focal bone abnormality. Soft
tissues are unremarkable.
IMPRESSION: Negative.

## 2016-11-04 ENCOUNTER — Emergency Department
Admission: EM | Admit: 2016-11-04 | Discharge: 2016-11-04 | Disposition: A | Discharge status: Home / Self Care | Payer: Medicaid Other | Attending: Emergency Medicine | Admitting: Emergency Medicine

## 2016-11-04 ENCOUNTER — Encounter: Payer: Self-pay | Admitting: Emergency Medicine

## 2016-11-04 ENCOUNTER — Emergency Department: Payer: Medicaid Other

## 2016-11-04 DIAGNOSIS — F319 Bipolar disorder, unspecified: Secondary | ICD-10-CM | POA: Diagnosis not present

## 2016-11-04 DIAGNOSIS — R0602 Shortness of breath: Secondary | ICD-10-CM | POA: Insufficient documentation

## 2016-11-04 DIAGNOSIS — F1210 Cannabis abuse, uncomplicated: Secondary | ICD-10-CM | POA: Diagnosis not present

## 2016-11-04 DIAGNOSIS — Z87891 Personal history of nicotine dependence: Secondary | ICD-10-CM | POA: Diagnosis not present

## 2016-11-04 DIAGNOSIS — Z79899 Other long term (current) drug therapy: Secondary | ICD-10-CM | POA: Insufficient documentation

## 2016-11-04 DIAGNOSIS — K30 Functional dyspepsia: Secondary | ICD-10-CM | POA: Diagnosis present

## 2016-11-04 DIAGNOSIS — Z794 Long term (current) use of insulin: Secondary | ICD-10-CM | POA: Insufficient documentation

## 2016-11-04 DIAGNOSIS — I10 Essential (primary) hypertension: Secondary | ICD-10-CM | POA: Insufficient documentation

## 2016-11-04 DIAGNOSIS — E119 Type 2 diabetes mellitus without complications: Secondary | ICD-10-CM | POA: Insufficient documentation

## 2016-11-04 DIAGNOSIS — Z8673 Personal history of transient ischemic attack (TIA), and cerebral infarction without residual deficits: Secondary | ICD-10-CM | POA: Diagnosis not present

## 2016-11-04 DIAGNOSIS — Z7982 Long term (current) use of aspirin: Secondary | ICD-10-CM | POA: Insufficient documentation

## 2016-11-04 DIAGNOSIS — F1410 Cocaine abuse, uncomplicated: Secondary | ICD-10-CM | POA: Insufficient documentation

## 2016-11-04 DIAGNOSIS — I2510 Atherosclerotic heart disease of native coronary artery without angina pectoris: Secondary | ICD-10-CM | POA: Insufficient documentation

## 2016-11-04 DIAGNOSIS — Z9189 Other specified personal risk factors, not elsewhere classified: Secondary | ICD-10-CM

## 2016-11-04 LAB — BASIC METABOLIC PANEL
Anion gap: 7 (ref 5–15)
BUN: 31 mg/dL — AB (ref 6–20)
CALCIUM: 8.6 mg/dL — AB (ref 8.9–10.3)
CO2: 25 mmol/L (ref 22–32)
Chloride: 105 mmol/L (ref 101–111)
Creatinine, Ser: 1.2 mg/dL (ref 0.61–1.24)
GFR calc Af Amer: >60 mL/min (ref >60)
GLUCOSE: 151 mg/dL — AB (ref 65–99)
Potassium: 4.3 mmol/L (ref 3.5–5.1)
Sodium: 137 mmol/L (ref 135–145)

## 2016-11-04 LAB — CBC
HCT: 37.7 % — ABNORMAL LOW (ref 40.0–52.0)
Hemoglobin: 13.5 g/dL (ref 13.0–18.0)
MCH: 31.9 pg (ref 26.0–34.0)
MCHC: 35.7 g/dL (ref 32.0–36.0)
MCV: 89.2 fL (ref 80.0–100.0)
Platelets: 245 10*3/uL (ref 150–440)
RBC: 4.22 MIL/uL — ABNORMAL LOW (ref 4.40–5.90)
RDW: 12.6 % (ref 11.5–14.5)
WBC: 10.3 10*3/uL (ref 3.8–10.6)

## 2016-11-04 LAB — TROPONIN I
TROPONIN I: 0.04 ng/mL — AB (ref <0.03)
TROPONIN I: 0.04 ng/mL — AB (ref <0.03)

## 2016-11-04 LAB — LIPASE, BLOOD: Lipase: 18 U/L (ref 11–51)

## 2016-11-04 LAB — HEPATIC FUNCTION PANEL
ALT: 20 U/L (ref 17–63)
AST: 21 U/L (ref 15–41)
Albumin: 3.5 g/dL (ref 3.5–5.0)
Alkaline Phosphatase: 54 U/L (ref 38–126)
BILIRUBIN TOTAL: 0.6 mg/dL (ref 0.3–1.2)
Total Protein: 6.3 g/dL — ABNORMAL LOW (ref 6.5–8.1)

## 2016-11-04 LAB — BRAIN NATRIURETIC PEPTIDE: B NATRIURETIC PEPTIDE 5: 25 pg/mL (ref 0.0–100.0)

## 2016-11-04 NOTE — ED Notes (Signed)
PT  VOL  IVC  NOTE  PUT  IN ERROR 

## 2016-11-04 NOTE — ED Provider Notes (Addendum)
[facility] Provider Note  ____________________________________________   I have reviewed the triage vital signs and the nursing notes.   HISTORY  Chief Complaint Chest Pain    HPI [name] [name]. is a 56 y.o. male Who presents today complaining of acid indigestion and sometimes when he lies flat he feels short of breath. Patient has no abdominal pain he denies any chest pain to me, he states sometimes he has a burning sensation when he lies flat. Patient was given a heart catheter in 2015 which showed no occlusive disease. He does however have a history of CHF.last echo showed EF of 45-50% none on May of this year. He does not have any significant leg swelling, he is not on anydiuretics, patient does have a bipolar. He states he is compliant with his medication and he is not sure exactly what he supposed to be taking.  this time he has no discomfort like to go home. He consents to stay for further evaluation however.   Past Medical History:  Diagnosis Date  . Aortic stenosis, mild February 2015  . Auditory hallucinations   . Bipolar disorder (HCC)   . Coronary artery disease    No reported catheterization. Negative Myoview stress test and 2013.  Marland Kitchen Depression   . Diabetes mellitus   . GERD (gastroesophageal reflux disease)   . History of suicide attempt   . Hx of medication noncompliance   . Hypertension   . Hypertensive hypertrophic cardiomyopathy [facility]) February 2015   Severe concentric LVH on echocardiogram.  . Obesity, Class II, BMI 35-39.9, with comorbidity   . Pericarditis   . Polysubstance abuse (HCC)    cocaine, marijuana  . Stroke [facility]) February 2015   Right MCA aneurysm; head MRI - acute infarct of the right frontal lobe. Smaller acute infarct of the left frontal lobe. Thought to be due to cerebral emboli. Left PICA infarct. Small chronic lacunar infarct right internal capsule. Chronic microvascular ischemic  change with white matter change.  . Syncope and collapse     Patient Active Problem List   Diagnosis Date Noted  . Accelerated hypertension 06/07/2016  . GERD (gastroesophageal reflux disease) 06/07/2016  . Bipolar affective disorder (HCC) 06/07/2016  . Chest pain 06/07/2016  . Unspecified Depressive disorder 04/17/2016  . 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 Tobacco use disorder 11/07/2014  . Stimulant use disorder (HCC) crack cocaine 11/07/2014  . Alcohol use disorder, moderate, dependence (HCC) 11/07/2014  . CAD (coronary artery disease) 11/05/2014  . Cerebral infarction (HCC) 10/19/2014  . LVH (left ventricular hypertrophy) 05/20/2013  . Obesity (BMI 30-39.9) 04/10/2013  . Aortic stenosis, mild 03/07/2013  . DM (diabetes mellitus), type 2, uncontrolled (HCC) 01/05/2013  . Essential hypertension, malignant 01/25/2009    Past Surgical History:  Procedure Laterality Date  . Carotid Dopplers:  03/23/2013   Interval thickening of bilateral common carotid no significant plaque formation. Tortuosity but no significant stenosis. Patent bilateral vertebral  arteries  . NM MYOVIEW LTD  July 2013   No ischemia or infarct. There is inferior wall defec suggestive of diaphragmatic attenuation. Mild LV dilation at to be due to hypertensive heart disease. EF 55%. Poor exercise tolerance.  Marland Kitchen PERICARDIAL FLUID DRAINAGE    . TRANSTHORACIC ECHOCARDIOGRAM  03/23/2013   EF 5560%. Elevated left atrial pressure. Severe concentric LVH. Mild aortic stenosis. -- Appearance of hypertensive hypertrophic heart disease.  No clear cardioembolic source    Prior to Admission medications  Medication Sig Start Date End Date Taking? Authorizing Provider  amLODipine (NORVASC) 10 MG tablet Take 1 tablet (10 mg total) by mouth daily. 04/17/16   Jimmy Footman, MD  aspirin 81 MG EC tablet Take 1 tablet (81 mg total) by mouth daily. 04/17/16   Jimmy Footman, MD  atorvastatin (LIPITOR) 20 MG tablet Take 1 tablet (20 mg total) by mouth daily at 6 PM. 04/17/16   Jimmy Footman, MD  chlorthalidone (HYGROTON) 25 MG tablet Take 1 tablet (25 mg total) by mouth daily. 04/17/16   Jimmy Footman, MD  cloNIDine (CATAPRES) 0.2 MG tablet Take 1 tablet (0.2 mg total) by mouth 2 (two) times daily. Patient not taking: Reported on 06/07/2016 04/17/16   Jimmy Footman, MD  clopidogrel (PLAVIX) 75 MG tablet Take 1 tablet (75 mg total) by mouth daily. 04/17/16   Jimmy Footman, MD  insulin aspart (NOVOLOG) 100 UNIT/ML FlexPen Inject 10 Units into the skin 3 (three) times daily with meals. 07/26/13   [provider]  lisinopril (PRINIVIL,ZESTRIL) 10 MG tablet Take 1 tablet (10 mg total) by mouth daily. Patient not taking: Reported on 09/20/2016 06/09/16   Enedina Finner, MD  metFORMIN (GLUCOPHAGE) 500 MG tablet Take 1 tablet (500 mg total) by mouth 2 (two) times daily with a meal. 04/17/16   Jimmy Footman, MD  naproxen (NAPROSYN) 500 MG tablet Take 1 tablet (500 mg total) by mouth 2 (two) times daily with a meal. 09/20/16   Jene Every, MD  nitroGLYCERIN (NITROSTAT) 0.4 MG SL tablet Place 1 tablet (0.4 mg total) under the tongue every 5 (five) minutes as needed for chest pain. 04/17/16   Jimmy Footman, MD  oxyCODONE-acetaminophen (ROXICET) 5-325 MG tablet Take 1 tablet by mouth every 6 (six) hours as needed for severe pain. 09/24/16 09/24/17  Merrily Brittle, MD  pantoprazole (PROTONIX) 40 MG tablet Take 1 tablet (40 mg total) by mouth daily. Patient not taking: Reported on 06/07/2016 04/17/16   Jimmy Footman, MD  sertraline (ZOLOFT) 50 MG tablet Take 1 tablet (50 mg total) by mouth daily. Patient not taking: Reported on 06/07/2016 04/17/16   Jimmy Footman, MD  traZODone (DESYREL) 150 MG tablet Take 1 tablet (150 mg total) by mouth at bedtime. 04/17/16   Jimmy Footman, MD    Allergies Patient has no known allergies.  Family History  Problem Relation Age of Onset  . Stroke Mother   . Diabetes type II Mother   . Hypertension Mother   . Hypertension Father   . Arthritis Father   . Arthritis Sister   . Diabetes type II Sister   . Hypertension Sister     Social History Social History  Substance Use Topics  . Smoking status: Former Smoker    Packs/day: 0.50    Years: 40.00    Types: Cigars  . Smokeless tobacco: Never Used  . Alcohol use No     Comment: occasionally; reports 1 pint of vodka per month at paydaysocially    Review of Systems Constitutional: No fever/chills Eyes: No visual changes. ENT: No sore throat. No stiff neck no neck pain Cardiovascular: Denies chest pain.states he sometimes when he eats spicy. He has reflux disease Respiratory: Denies shortness of breath. Gastrointestinal:   no vomiting.  No diarrhea.  No constipation. Genitourinary: Negative for dysuria. Musculoskeletal: Negative lower extremity swelling Skin: Negative for rash. Neurological: Negative for severe headaches, focal weakness or numbness.   ____________________________________________   PHYSICAL EXAM:  VITAL SIGNS: ED Triage Vitals  Enc Vitals Group  BP 11/04/16 1322 (!) 148/92     Pulse Rate 11/04/16 1322 95     Resp 11/04/16 1322 16     Temp 11/04/16 1322 99 F (37.2 C)     Temp Source 11/04/16 1322 Oral     SpO2 11/04/16 1322 98 %     Weight 11/04/16 1323 225 lb (102.1 kg)     Height 11/04/16 1323  (1.778 m)     Head Circumference --      Peak Flow --      Pain Score 11/04/16 1329 10     Pain Loc --      Pain Edu? --      Excl. in GC? --     Constitutional: Alert and oriented. Well appearing and in no acute distress. Eyes: Conjunctivae are normal Head: Atraumatic HEENT: No congestion/rhinnorhea. Mucous membranes are moist.  Oropharynx non-erythematous Neck:   Nontender with no meningismus, no masses, no  stridor Cardiovascular: Normal rate, regular rhythm. Grossly normal heart sounds.  Good peripheral circulation. Respiratory: Normal respiratory effort.  No retractions. Lungs CTAB. Abdominal: Soft and nontender. No distention. No guarding no rebound Back:  There is no focal tenderness or step off.  there is no midline tenderness there are no lesions noted. there is no CVA tenderness Musculoskeletal: No lower extremity tenderness, no upper extremity tenderness. No joint effusions, no DVT signs strong distal pulses no edema Neurologic:  Normal speech and language. No gross focal neurologic deficits are appreciated.  Skin:  Skin is warm, dry and intact. No rash noted. Psychiatric: Mood and affect are normal. Speech and behavior are normal.  ____________________________________________   LABS (all labs ordered are listed, but only abnormal results are displayed)  Labs Reviewed  BASIC METABOLIC PANEL - Abnormal; Notable for the following:       Result Value   Glucose, Bld 151 (*)    BUN 31 (*)    Calcium 8.6 (*)    All other components within normal limits  CBC - Abnormal; Notable for the following:    RBC 4.22 (*)    HCT 37.7 (*)    All other components within normal limits  TROPONIN I - Abnormal; Notable for the following:    Troponin I 0.04 (*)    All other components within normal limits  BRAIN NATRIURETIC PEPTIDE  LIPASE, BLOOD  HEPATIC FUNCTION PANEL  TROPONIN I    Pertinent labs  results that were available during my care of the patient were reviewed by me and considered in my medical decision making (see chart for details). ____________________________________________  EKG  I personally interpreted any EKGs ordered by me or triage sinus rhythm rate 97 bpm, nonspecific ST changes no acute ST elevation or depression, ____________________________________________  RADIOLOGY  Pertinent labs & imaging results that were available during my care of the patient were reviewed  by me and considered in my medical decision making (see chart for details). If possible, patient and/or family made aware of any abnormal findings. ____________________________________________    PROCEDURES  Procedure(s) performed: None  Procedures  Critical Care performed: None  ____________________________________________   INITIAL IMPRESSION / ASSESSMENT AND PLAN / ED COURSE  Pertinent labs & imaging results that were available during my care of the patient were reviewed by me and considered in my medical decision making (see chart for details).  patient here with very poorly describes symptoms of feeling sometimes a burning sensation in his chest, he denies any chest pain or shortness of breath  to me unless he lies flat and that's only sometimes. He is unrelated to the department in no acute distress he declines to have monitoring placed. He has no evidence of ACS on prior workup. He does have hypertensive cardiomyopathy, possibly he would require some diuresis as he does not appear to be taking any diuretics. However, he is quite well-appearing. We will send a second troponin and BNP and reassess-  ----------------------------------------- 6:12 PM on 11/04/2016 -----------------------------------------  Informed that pt eloped.    ____________________________________________   FINAL CLINICAL IMPRESSION(S) / ED DIAGNOSES  Final diagnoses:  None      This chart was dictated using voice recognition software.  Despite best efforts to proofread,  errors can occur which can change meaning.      Jeanmarie Plant, MD 11/04/16 1756    Jeanmarie Plant, MD 11/04/16 [phone]

## 2016-11-04 NOTE — ED Notes (Signed)
PT  PUT  UNDER  IVC  PAPERS  PER  DR Sharma Covert MD  INFORMED  RN  Marga Hoots

## 2016-11-04 NOTE — ED Notes (Signed)
Gave patient a blanket. 

## 2016-11-04 NOTE — ED Notes (Signed)
Pt arrived via EMS from home for reports of epigastric spasms and pain for 24 hours. Pt has history of GERD and has tried all his prescribed medications without relief. EMS reports VSS, NSR 12 lead unremarkable.

## 2016-11-04 NOTE — ED Triage Notes (Signed)
Pt c/o mid sternal chest pain started last night and has continued through today with some shortness of breath.

## 2016-11-04 NOTE — ED Notes (Signed)
Gave patient a sprite. 

## 2016-11-04 NOTE — ED Notes (Signed)
Patient left without instructions.

## 2016-11-07 ENCOUNTER — Emergency Department: Payer: Medicaid Other

## 2016-11-07 ENCOUNTER — Inpatient Hospital Stay: Payer: Medicaid Other

## 2016-11-07 ENCOUNTER — Other Ambulatory Visit: Payer: Self-pay

## 2016-11-07 ENCOUNTER — Inpatient Hospital Stay
Admission: EM | Admit: 2016-11-07 | Discharge: 2016-11-15 | DRG: 377 | Disposition: A | Discharge status: Skilled Nursing Facility | Payer: Medicaid Other | Attending: Internal Medicine | Admitting: Internal Medicine

## 2016-11-07 ENCOUNTER — Encounter: Payer: Self-pay | Admitting: Emergency Medicine

## 2016-11-07 DIAGNOSIS — R1310 Dysphagia, unspecified: Secondary | ICD-10-CM | POA: Diagnosis present

## 2016-11-07 DIAGNOSIS — G4730 Sleep apnea, unspecified: Secondary | ICD-10-CM | POA: Diagnosis present

## 2016-11-07 DIAGNOSIS — Z794 Long term (current) use of insulin: Secondary | ICD-10-CM

## 2016-11-07 DIAGNOSIS — F209 Schizophrenia, unspecified: Secondary | ICD-10-CM | POA: Diagnosis present

## 2016-11-07 DIAGNOSIS — I69322 Dysarthria following cerebral infarction: Secondary | ICD-10-CM

## 2016-11-07 DIAGNOSIS — R578 Other shock: Secondary | ICD-10-CM | POA: Diagnosis present

## 2016-11-07 DIAGNOSIS — I350 Nonrheumatic aortic (valve) stenosis: Secondary | ICD-10-CM | POA: Diagnosis present

## 2016-11-07 DIAGNOSIS — Z6832 Body mass index (BMI) 32.0-32.9, adult: Secondary | ICD-10-CM

## 2016-11-07 DIAGNOSIS — Z823 Family history of stroke: Secondary | ICD-10-CM | POA: Diagnosis not present

## 2016-11-07 DIAGNOSIS — N179 Acute kidney failure, unspecified: Secondary | ICD-10-CM | POA: Diagnosis present

## 2016-11-07 DIAGNOSIS — K219 Gastro-esophageal reflux disease without esophagitis: Secondary | ICD-10-CM | POA: Diagnosis present

## 2016-11-07 DIAGNOSIS — D649 Anemia, unspecified: Secondary | ICD-10-CM

## 2016-11-07 DIAGNOSIS — Z8249 Family history of ischemic heart disease and other diseases of the circulatory system: Secondary | ICD-10-CM | POA: Diagnosis not present

## 2016-11-07 DIAGNOSIS — Z7982 Long term (current) use of aspirin: Secondary | ICD-10-CM

## 2016-11-07 DIAGNOSIS — I639 Cerebral infarction, unspecified: Secondary | ICD-10-CM

## 2016-11-07 DIAGNOSIS — R4182 Altered mental status, unspecified: Secondary | ICD-10-CM

## 2016-11-07 DIAGNOSIS — E1169 Type 2 diabetes mellitus with other specified complication: Secondary | ICD-10-CM | POA: Diagnosis present

## 2016-11-07 DIAGNOSIS — Z8261 Family history of arthritis: Secondary | ICD-10-CM

## 2016-11-07 DIAGNOSIS — G9341 Metabolic encephalopathy: Secondary | ICD-10-CM | POA: Diagnosis present

## 2016-11-07 DIAGNOSIS — F319 Bipolar disorder, unspecified: Secondary | ICD-10-CM | POA: Diagnosis present

## 2016-11-07 DIAGNOSIS — K922 Gastrointestinal hemorrhage, unspecified: Secondary | ICD-10-CM | POA: Diagnosis present

## 2016-11-07 DIAGNOSIS — R4701 Aphasia: Secondary | ICD-10-CM | POA: Diagnosis present

## 2016-11-07 DIAGNOSIS — I422 Other hypertrophic cardiomyopathy: Secondary | ICD-10-CM | POA: Diagnosis present

## 2016-11-07 DIAGNOSIS — R29810 Facial weakness: Secondary | ICD-10-CM | POA: Diagnosis present

## 2016-11-07 DIAGNOSIS — K921 Melena: Principal | ICD-10-CM | POA: Diagnosis present

## 2016-11-07 DIAGNOSIS — I119 Hypertensive heart disease without heart failure: Secondary | ICD-10-CM | POA: Diagnosis present

## 2016-11-07 DIAGNOSIS — D62 Acute posthemorrhagic anemia: Secondary | ICD-10-CM | POA: Diagnosis present

## 2016-11-07 DIAGNOSIS — E669 Obesity, unspecified: Secondary | ICD-10-CM | POA: Diagnosis present

## 2016-11-07 DIAGNOSIS — Z915 Personal history of self-harm: Secondary | ICD-10-CM

## 2016-11-07 DIAGNOSIS — Z7902 Long term (current) use of antithrombotics/antiplatelets: Secondary | ICD-10-CM | POA: Diagnosis not present

## 2016-11-07 DIAGNOSIS — I2510 Atherosclerotic heart disease of native coronary artery without angina pectoris: Secondary | ICD-10-CM | POA: Diagnosis present

## 2016-11-07 DIAGNOSIS — H543 Unqualified visual loss, both eyes: Secondary | ICD-10-CM | POA: Diagnosis present

## 2016-11-07 DIAGNOSIS — Z87891 Personal history of nicotine dependence: Secondary | ICD-10-CM

## 2016-11-07 DIAGNOSIS — Z833 Family history of diabetes mellitus: Secondary | ICD-10-CM

## 2016-11-07 DIAGNOSIS — R4781 Slurred speech: Secondary | ICD-10-CM

## 2016-11-07 LAB — CBC WITH DIFFERENTIAL/PLATELET
BASOS PCT: 0 %
Basophils Absolute: 0 10*3/uL (ref 0–0.1)
Eosinophils Absolute: 0.1 10*3/uL (ref 0–0.7)
Eosinophils Relative: 0 %
HEMATOCRIT: 22.1 % — AB (ref 40.0–52.0)
Hemoglobin: 7.5 g/dL — ABNORMAL LOW (ref 13.0–18.0)
Lymphocytes Relative: 14 %
Lymphs Abs: 1.9 10*3/uL (ref 1.0–3.6)
MCH: 31 pg (ref 26.0–34.0)
MCHC: 34 g/dL (ref 32.0–36.0)
MCV: 91.1 fL (ref 80.0–100.0)
MONO ABS: 0.9 10*3/uL (ref 0.2–1.0)
MONOS PCT: 6 %
NEUTROS ABS: 10.9 10*3/uL — AB (ref 1.4–6.5)
Neutrophils Relative %: 80 %
Platelets: 286 10*3/uL (ref 150–440)
RBC: 2.43 MIL/uL — ABNORMAL LOW (ref 4.40–5.90)
RDW: 13.3 % (ref 11.5–14.5)
WBC: 13.7 10*3/uL — ABNORMAL HIGH (ref 3.8–10.6)

## 2016-11-07 LAB — IRON AND TIBC
IRON: 55 ug/dL (ref 45–182)
Saturation Ratios: 20 % (ref 17.9–39.5)
TIBC: 270 ug/dL (ref 250–450)
UIBC: 215 ug/dL

## 2016-11-07 LAB — COMPREHENSIVE METABOLIC PANEL
ALK PHOS: 48 U/L (ref 38–126)
ALT: 20 U/L (ref 17–63)
AST: 18 U/L (ref 15–41)
Albumin: 3.6 g/dL (ref 3.5–5.0)
Anion gap: 9 (ref 5–15)
BUN: 36 mg/dL — ABNORMAL HIGH (ref 6–20)
CALCIUM: 8.3 mg/dL — AB (ref 8.9–10.3)
CO2: 19 mmol/L — AB (ref 22–32)
CREATININE: 1.27 mg/dL — AB (ref 0.61–1.24)
Chloride: 111 mmol/L (ref 101–111)
GFR calc non Af Amer: >60 mL/min (ref >60)
Glucose, Bld: 236 mg/dL — ABNORMAL HIGH (ref 65–99)
Potassium: 4.6 mmol/L (ref 3.5–5.1)
SODIUM: 139 mmol/L (ref 135–145)
Total Bilirubin: 0.6 mg/dL (ref 0.3–1.2)
Total Protein: 6.5 g/dL (ref 6.5–8.1)

## 2016-11-07 LAB — FERRITIN: FERRITIN: 86 ng/mL (ref 24–336)

## 2016-11-07 LAB — URINE DRUG SCREEN, QUALITATIVE (ARMC ONLY)
Amphetamines, Ur Screen: NOT DETECTED
BARBITURATES, UR SCREEN: NOT DETECTED
Benzodiazepine, Ur Scrn: NOT DETECTED
CANNABINOID 50 NG, UR ~~LOC~~: NOT DETECTED
COCAINE METABOLITE, UR ~~LOC~~: NOT DETECTED
MDMA (Ecstasy)Ur Screen: NOT DETECTED
Methadone Scn, Ur: NOT DETECTED
Opiate, Ur Screen: NOT DETECTED
Phencyclidine (PCP) Ur S: NOT DETECTED
TRICYCLIC, UR SCREEN: NOT DETECTED

## 2016-11-07 LAB — GLUCOSE, CAPILLARY
GLUCOSE-CAPILLARY: 156 mg/dL — AB (ref 65–99)
Glucose-Capillary: 135 mg/dL — ABNORMAL HIGH (ref 65–99)

## 2016-11-07 LAB — URINALYSIS, COMPLETE (UACMP) WITH MICROSCOPIC
BILIRUBIN URINE: NEGATIVE
Bacteria, UA: NONE SEEN
GLUCOSE, UA: 50 mg/dL — AB
Ketones, ur: NEGATIVE mg/dL
Leukocytes, UA: NEGATIVE
NITRITE: NEGATIVE
PROTEIN: NEGATIVE mg/dL
Specific Gravity, Urine: 1.025 (ref 1.005–1.030)
pH: 5 (ref 5.0–8.0)

## 2016-11-07 LAB — PROTIME-INR
INR: 1.19
PROTHROMBIN TIME: 15 s (ref 11.4–15.2)

## 2016-11-07 LAB — TROPONIN I: Troponin I: <0.03 ng/mL (ref <0.03)

## 2016-11-07 LAB — ABO/RH: ABO/RH(D): O NEG

## 2016-11-07 LAB — ACETAMINOPHEN LEVEL

## 2016-11-07 LAB — SALICYLATE LEVEL

## 2016-11-07 LAB — VITAMIN B12: Vitamin B-12: 235 pg/mL (ref 180–914)

## 2016-11-07 LAB — MRSA PCR SCREENING: MRSA BY PCR: NEGATIVE

## 2016-11-07 LAB — LACTIC ACID, PLASMA: Lactic Acid, Venous: 1.6 mmol/L (ref 0.5–1.9)

## 2016-11-07 LAB — PREPARE RBC (CROSSMATCH)

## 2016-11-07 LAB — HEMOGLOBIN: Hemoglobin: 7.4 g/dL — ABNORMAL LOW (ref 13.0–18.0)

## 2016-11-07 LAB — ETHANOL

## 2016-11-07 MED ORDER — INSULIN ASPART 100 UNIT/ML ~~LOC~~ SOLN
0.0000 [IU] | Freq: Three times a day (TID) | SUBCUTANEOUS | Status: DC
  Administered 2016-11-07 – 2016-11-13 (×10): 1 [IU] via SUBCUTANEOUS
  Administered 2016-11-14 (×2): 2 [IU] via SUBCUTANEOUS
  Administered 2016-11-14: 3 [IU] via SUBCUTANEOUS
  Administered 2016-11-15: 2 [IU] via SUBCUTANEOUS
  Administered 2016-11-15: 1 [IU] via SUBCUTANEOUS
  Filled 2016-11-07 (×14): qty 1

## 2016-11-07 MED ORDER — ACETAMINOPHEN 325 MG PO TABS: 650.0000 mg | ORAL_TABLET | Freq: Once | ORAL | Status: DC

## 2016-11-07 MED ORDER — SODIUM CHLORIDE 0.9 % IV SOLN
8.0000 mg/h | INTRAVENOUS | Status: DC
  Administered 2016-11-07 – 2016-11-09 (×5): 8 mg/h via INTRAVENOUS
  Filled 2016-11-07 (×5): qty 80

## 2016-11-07 MED ORDER — ACETAMINOPHEN 325 MG PO TABS
650.0000 mg | ORAL_TABLET | Freq: Four times a day (QID) | ORAL | Status: DC | PRN
Start: 2016-11-07 — End: 2016-11-15

## 2016-11-07 MED ORDER — INSULIN ASPART 100 UNIT/ML ~~LOC~~ SOLN
0.0000 [IU] | Freq: Every day | SUBCUTANEOUS | Status: DC
  Administered 2016-11-13: 2 [IU] via SUBCUTANEOUS
  Filled 2016-11-07: qty 1

## 2016-11-07 MED ORDER — SODIUM CHLORIDE 0.9 % IV SOLN
80.0000 mg | Freq: Once | INTRAVENOUS | Status: AC
  Administered 2016-11-07: 80 mg via INTRAVENOUS
  Filled 2016-11-07: qty 80

## 2016-11-07 MED ORDER — LORAZEPAM 2 MG/ML IJ SOLN
0.5000 mg | Freq: Once | INTRAMUSCULAR | Status: AC
  Administered 2016-11-07: 0.5 mg via INTRAVENOUS
  Filled 2016-11-07: qty 1

## 2016-11-07 MED ORDER — ACETAMINOPHEN 650 MG RE SUPP: 650.0000 mg | Freq: Four times a day (QID) | RECTAL | Status: DC | PRN

## 2016-11-07 MED ORDER — SODIUM CHLORIDE 0.9 % IV SOLN
Freq: Once | INTRAVENOUS | Status: AC
  Administered 2016-11-07: 22:00:00 via INTRAVENOUS

## 2016-11-07 MED ORDER — TRAZODONE HCL 50 MG PO TABS
150.0000 mg | ORAL_TABLET | Freq: Every day | ORAL | Status: DC
Start: 2016-11-07 — End: 2016-11-15
  Administered 2016-11-11 – 2016-11-14 (×3): 150 mg via ORAL
  Filled 2016-11-07 (×4): qty 3

## 2016-11-07 MED ORDER — PANTOPRAZOLE SODIUM 40 MG IV SOLR: 40.0000 mg | Freq: Two times a day (BID) | INTRAVENOUS | Status: DC

## 2016-11-07 MED ORDER — SODIUM CHLORIDE 0.9 % IV SOLN: INTRAVENOUS | Status: DC

## 2016-11-07 MED ORDER — SODIUM CHLORIDE 0.9 % IV BOLUS (SEPSIS)
1000.0000 mL | Freq: Once | INTRAVENOUS | Status: AC
  Administered 2016-11-07: 1000 mL via INTRAVENOUS

## 2016-11-07 NOTE — Progress Notes (Signed)
Dr. Shaune Pollack at bedside preforming rectal exam.  Patient guiac positive.

## 2016-11-07 NOTE — Consult Note (Signed)
[name] Darby, MD [address]  [place]  [place], [place] [address]  Main: [phone]  Fax: [phone] Pager: [phone]   Consultation  Referring Provider:     No ref. provider found Primary Care Physician:  Theotis Burrow, MD Primary Gastroenterologist:  None    Reason for Consultation:     Acute anemia, melena  Date of Admission:  11/07/2016 Date of Consultation:  11/07/2016         HPI:   [name] Benzel. is a 56 y.o. male with history of schizophrenia, polysubstance abuse, history of stroke presented with altered mental status and found to have acute anemia, hemoglobin 7.5. It was 13.5 on 11/04/2016. He initially presented to the ER on 11/04/2016 secondary to epigastric pain and spasm for one day despite being on acid suppression medications for his GERD. Patient left without instructions. Today, he presented to ER again with right facial droop and slurred speech, altered mental status. CT head did not show intracranial bleed. Per his nurse, he had dark green stool when he was cleaned up. GI consulted for further evaluation.  Patient is a poor historian secondary to altered mental status. His daughter and son are bedside who provided some of the history. Apparently, patient placed by himself and he has underlying psychiatric problems and she is not sure if they are under control. She reports that he is not on any blood thinning medications. Unknown history of NSAID use. Unknown history of alcohol use. His urine toxicology screen on admission came back negative for cocaine, alcohol. He is currently scheduled for MRI brain given history of strokes. He is nothing by mouth and on protonix drip.  GI Procedures: None  Past Medical History:  Diagnosis Date  . Aortic stenosis, mild February 2015  . Auditory hallucinations   . Bipolar disorder ([place])   . Coronary artery disease    No reported catheterization. Negative Myoview stress test and 2013.  Marland Kitchen Depression     . Diabetes mellitus   . GERD (gastroesophageal reflux disease)   . History of suicide attempt   . Hx of medication noncompliance   . Hypertension   . Hypertensive hypertrophic cardiomyopathy [facility]) February 2015   Severe concentric LVH on echocardiogram.  . Obesity, Class II, BMI 35-39.9, with comorbidity   . Pericarditis   . Polysubstance abuse ([place])    cocaine, marijuana  . Stroke [facility]) February 2015   Right MCA aneurysm; head MRI - acute infarct of the right frontal lobe. Smaller acute infarct of the left frontal lobe. Thought to be due to cerebral emboli. Left PICA infarct. Small chronic lacunar infarct right internal capsule. Chronic microvascular ischemic change with white matter change.  . Syncope and collapse     Past Surgical History:  Procedure Laterality Date  . Carotid Dopplers:  03/23/2013   Interval thickening of bilateral common carotid no significant plaque formation. Tortuosity but no significant stenosis. Patent bilateral vertebral  arteries  . NM MYOVIEW LTD  July 2013   No ischemia or infarct. There is inferior wall defec suggestive of diaphragmatic attenuation. Mild LV dilation at to be due to hypertensive heart disease. EF 55%. Poor exercise tolerance.  Marland Kitchen PERICARDIAL FLUID DRAINAGE    . TRANSTHORACIC ECHOCARDIOGRAM  03/23/2013   EF 5560%. Elevated left atrial pressure. Severe concentric LVH. Mild aortic stenosis. -- Appearance of hypertensive hypertrophic heart disease.  No clear cardioembolic source    Prior to Admission medications   Medication Sig Start Date End  Date Taking? Authorizing Provider  amLODipine (NORVASC) 10 MG tablet Take 1 tablet (10 mg total) by mouth daily. 04/17/16  Yes Hildred Priest, MD  aspirin 81 MG EC tablet Take 1 tablet (81 mg total) by mouth daily. 04/17/16  Yes Hildred Priest, MD  atorvastatin (LIPITOR) 20 MG tablet Take 1 tablet (20 mg total) by mouth daily at 6 PM. 04/17/16  Yes Hildred Priest, MD   chlorthalidone (HYGROTON) 25 MG tablet Take 1 tablet (25 mg total) by mouth daily. 04/17/16  Yes Hildred Priest, MD  clopidogrel (PLAVIX) 75 MG tablet Take 1 tablet (75 mg total) by mouth daily. 04/17/16  Yes Hildred Priest, MD  insulin aspart (NOVOLOG) 100 UNIT/ML FlexPen Inject 10 Units into the skin 3 (three) times daily with meals. 07/26/13  Yes [provider]  metFORMIN (GLUCOPHAGE) 500 MG tablet Take 1 tablet (500 mg total) by mouth 2 (two) times daily with a meal. 04/17/16  Yes Hildred Priest, MD  naproxen (NAPROSYN) 500 MG tablet Take 1 tablet (500 mg total) by mouth 2 (two) times daily with a meal. 09/20/16  Yes Lavonia Drafts, MD  nitroGLYCERIN (NITROSTAT) 0.4 MG SL tablet Place 1 tablet (0.4 mg total) under the tongue every 5 (five) minutes as needed for chest pain. 04/17/16  Yes Hildred Priest, MD  traZODone (DESYREL) 150 MG tablet Take 1 tablet (150 mg total) by mouth at bedtime. 04/17/16  Yes Hildred Priest, MD    Family History  Problem Relation Age of Onset  . Stroke Mother   . Diabetes type II Mother   . Hypertension Mother   . Hypertension Father   . Arthritis Father   . Arthritis Sister   . Diabetes type II Sister   . Hypertension Sister      Social History  Substance Use Topics  . Smoking status: Former Smoker    Packs/day: 0.50    Years: 40.00    Types: Cigars  . Smokeless tobacco: Never Used  . Alcohol use No     Comment: occasionally; reports 1 pint of vodka per month at paydaysocially    Allergies as of 11/07/2016  . (No Known Allergies)    Review of Systems:    All systems reviewed and negative except where noted in HPI.   Physical Exam:  Vital signs in last 24 hours: Temp:  [98.6 F (37 C)-99.4 F (37.4 C)] 98.6 F (37 C) (10/04 1607) Pulse Rate:  [93-102] 99 (10/04 1607) Resp:  [18-26] 18 (10/04 1607) BP: (131-149)/(86-103) 140/91 (10/04 1700) SpO2:  [96 %-100 %] 100 % (10/04  1607) Weight:  [225 lb (102.1 kg)] 225 lb (102.1 kg) (10/04 1317)   General:  Altered mental status, NAD, Head:  Normocephalic and atraumatic. Eyes:   No icterus.   Conjunctiva pink. PERRLA. Ears:  Normal auditory acuity. Neck:  Supple; no masses or thyroidomegaly Lungs: Respirations even and unlabored. Lungs clear to auscultation bilaterally.   No wheezes, crackles, or rhonchi.  Heart:  Regular rate and rhythm;  Without murmur, clicks, rubs or gallops Abdomen:  Soft, nondistended, nontender. Normal bowel sounds. No appreciable masses or hepatomegaly.  No rebound or guarding.  Rectal:  Not performed. Msk:  Symmetrical without gross deformities. Moving all extremities Extremities:  Without edema, cyanosis or clubbing. Neurologic:  Confused, slurred speech, not coherent,  Skin:  Intact without significant lesions or rashes.  LAB RESULTS: CBC Latest Ref Rng & Units 11/07/2016 11/07/2016 11/04/2016  WBC 3.8 - 10.6 K/uL - 13.7(H) 10.3  Hemoglobin 13.0 -  18.0 g/dL 7.4(L) 7.5(L) 13.5  Hematocrit 40.0 - 52.0 % - 22.1(L) 37.7(L)  Platelets 150 - 440 K/uL - 286 245    BMET BMP Latest Ref Rng & Units 11/07/2016 11/04/2016 09/24/2016  Glucose 65 - 99 mg/dL 236(H) 151(H) 225(H)  BUN 6 - 20 mg/dL 36(H) 31(H) 18  Creatinine 0.61 - 1.24 mg/dL 1.27(H) 1.20 1.31(H)  Sodium 135 - 145 mmol/L 139 137 138  Potassium 3.5 - 5.1 mmol/L 4.6 4.3 3.9  Chloride 101 - 111 mmol/L 111 105 103  CO2 22 - 32 mmol/L 19(L) 25 28  Calcium 8.9 - 10.3 mg/dL 8.3(L) 8.6(L) 9.3    LFT Hepatic Function Latest Ref Rng & Units 11/07/2016 11/04/2016 09/24/2016  Total Protein 6.5 - 8.1 g/dL 6.5 6.3(L) 7.1  Albumin 3.5 - 5.0 g/dL 3.6 3.5 4.0  AST 15 - 41 U/L '18 21 19  '$ ALT 17 - 63 U/L '20 20 23  '$ Alk Phosphatase 38 - 126 U/L 48 54 55  Total Bilirubin 0.3 - 1.2 mg/dL 0.6 0.6 1.0  Bilirubin, Direct 0.1 - 0.5 mg/dL - <0.1(L) 0.1     STUDIES: Ct Head Wo Contrast  Result Date: 11/07/2016 CLINICAL DATA:  Facial droop and slurred  speech EXAM: CT HEAD WITHOUT CONTRAST TECHNIQUE: Contiguous axial images were obtained from the base of the skull through the vertex without intravenous contrast. COMPARISON:  06/07/2016 FINDINGS: Brain: There are changes consistent with prior left cerebellar infarct. These changes are stable from the prior exam. No acute hemorrhage, acute infarction or space-occupying mass lesion is noted. Scattered chronic white matter ischemic change is noted. Vascular: No hyperdense vessel or unexpected calcification. Skull: Normal. Negative for fracture or focal lesion. Sinuses/Orbits: No acute finding. Other: Old nasal bone fractures are noted. IMPRESSION: Changes consistent with remote left cerebellar infarct. Chronic ischemic changes are seen. No acute abnormality is noted. Electronically Signed   By: Inez Catalina M.D.   On: 11/07/2016 13:52      Impression / Plan:   [name] Redmann. is a 56 y.o. African-American male with polysubstance abuse, history of stroke, diabetes presented with epigastric pain, altered mental status and found to have melena and acute anemia. Does not have cirrhosis or chronic liver disease. Differentials include upper GI source such as peptic ulcer disease or erosive esophagitis or AVMs or a small bowel source or less likely right colonic source.  - If MRI brain is negative for stroke, recommend performing upper endoscopy - Continue Protonix drip - Nothing by mouth past midnight - Check ferritin, iron studies, vitamin B12 and RBC folate - Monitor CBC closely - Consider psychiatric evaluation if MRI brain is negative for acute stroke  Thank you for involving me in the care of this patient.  I'll follow along with you    LOS: 0 days   Sherri Sear, MD  11/07/2016, 7:15 PM   Note: This dictation was prepared with Dragon dictation along with smaller phrase technology. Any transcriptional errors that result from this process are unintentional.

## 2016-11-07 NOTE — ED Notes (Signed)
Patient transported to CT 

## 2016-11-07 NOTE — ED Triage Notes (Signed)
Pt found on park bench by a bystander. Pt with right facial droop and slurred speech on arrival.  Unable to understand speech. Found name on license in pocket. Hx stroke, DM, drug abuse in [place]IC.

## 2016-11-07 NOTE — H&P (Addendum)
[facility] [place] [facility]   PATIENT NAME: [name] [name]    MR#:  [id number]  DATE OF BIRTH:  [birth year]  DATE OF ADMISSION:  11/07/2016  PRIMARY CARE PHYSICIAN: Neita Goodnight Presley Raddle, MD   REQUESTING/REFERRING PHYSICIAN: Dr Creta Levin  CHIEF COMPLAINT:   Chief Complaint  Patient presents with  . Altered Mental Status    HISTORY OF PRESENT ILLNESS:  [name] [name]  is a 56 y.o. male brought in from [place] for altered mental status. I am unable to get any good history from the patient. He does answer a few yes or no questions. When I spoke with the granddaughter on the phone, she states that he is from [employer]. Patient's hemoglobin a couple days ago was 13.5 and today at 7.5. Rectal exam showed melena and was of course guaiac positive. Urine toxicology was negative.  PAST MEDICAL HISTORY:   Past Medical History:  Diagnosis Date  . Aortic stenosis, mild February 2015  . Auditory hallucinations   . Bipolar disorder (HCC)   . Coronary artery disease    No reported catheterization. Negative Myoview stress test and 2013.  Marland Kitchen Depression   . Diabetes mellitus   . GERD (gastroesophageal reflux disease)   . History of suicide attempt   . Hx of medication noncompliance   . Hypertension   . Hypertensive hypertrophic cardiomyopathy [facility]) February 2015   Severe concentric LVH on echocardiogram.  . Obesity, Class II, BMI 35-39.9, with comorbidity   . Pericarditis   . Polysubstance abuse (HCC)    cocaine, marijuana  . Stroke [facility]) February 2015   Right MCA aneurysm; head MRI - acute infarct of the right frontal lobe. Smaller acute infarct of the left frontal lobe. Thought to be due to cerebral emboli. Left PICA infarct. Small chronic lacunar infarct right internal capsule. Chronic microvascular ischemic change with white matter change.  . Syncope and collapse     PAST SURGICAL HISTORY:   Past Surgical History:  Procedure Laterality  Date  . Carotid Dopplers:  03/23/2013   Interval thickening of bilateral common carotid no significant plaque formation. Tortuosity but no significant stenosis. Patent bilateral vertebral  arteries  . NM MYOVIEW LTD  July 2013   No ischemia or infarct. There is inferior wall defec suggestive of diaphragmatic attenuation. Mild LV dilation at to be due to hypertensive heart disease. EF 55%. Poor exercise tolerance.  Marland Kitchen PERICARDIAL FLUID DRAINAGE    . TRANSTHORACIC ECHOCARDIOGRAM  03/23/2013   EF 5560%. Elevated left atrial pressure. Severe concentric LVH. Mild aortic stenosis. -- Appearance of hypertensive hypertrophic heart disease.  No clear cardioembolic source    SOCIAL HISTORY:   Social History  Substance Use Topics  . Smoking status: Former Smoker    Packs/day: 0.50    Years: 40.00    Types: Cigars  . Smokeless tobacco: Never Used  . Alcohol use No     Comment: occasionally; reports 1 pint of vodka per month at paydaysocially    FAMILY HISTORY:   Family History  Problem Relation Age of Onset  . Stroke Mother   . Diabetes type II Mother   . Hypertension Mother   . Hypertension Father   . Arthritis Father   . Arthritis Sister   . Diabetes type II Sister   . Hypertension Sister     DRUG ALLERGIES:  No Known Allergies  REVIEW OF SYSTEMS:  Patient able to answer a few yes or no questions but is unreliable with his  answers.  MEDICATIONS AT HOME:   Prior to Admission medications   Medication Sig Start Date End Date Taking? Authorizing Provider  amLODipine (NORVASC) 10 MG tablet Take 1 tablet (10 mg total) by mouth daily. 04/17/16  Yes Jimmy Footman, MD  aspirin 81 MG EC tablet Take 1 tablet (81 mg total) by mouth daily. 04/17/16  Yes Jimmy Footman, MD  atorvastatin (LIPITOR) 20 MG tablet Take 1 tablet (20 mg total) by mouth daily at 6 PM. 04/17/16  Yes Jimmy Footman, MD  chlorthalidone (HYGROTON) 25 MG tablet Take 1 tablet (25 mg  total) by mouth daily. 04/17/16  Yes Jimmy Footman, MD  clopidogrel (PLAVIX) 75 MG tablet Take 1 tablet (75 mg total) by mouth daily. 04/17/16  Yes Jimmy Footman, MD  insulin aspart (NOVOLOG) 100 UNIT/ML FlexPen Inject 10 Units into the skin 3 (three) times daily with meals. 07/26/13  Yes [provider]  metFORMIN (GLUCOPHAGE) 500 MG tablet Take 1 tablet (500 mg total) by mouth 2 (two) times daily with a meal. 04/17/16  Yes Jimmy Footman, MD  naproxen (NAPROSYN) 500 MG tablet Take 1 tablet (500 mg total) by mouth 2 (two) times daily with a meal. 09/20/16  Yes Jene Every, MD  nitroGLYCERIN (NITROSTAT) 0.4 MG SL tablet Place 1 tablet (0.4 mg total) under the tongue every 5 (five) minutes as needed for chest pain. 04/17/16  Yes Jimmy Footman, MD  traZODone (DESYREL) 150 MG tablet Take 1 tablet (150 mg total) by mouth at bedtime. 04/17/16  Yes Jimmy Footman, MD      VITAL SIGNS:  Blood pressure 137/86, pulse 98, temperature 99.4 F (37.4 C), temperature source Oral, resp. rate (!) 22, height  (1.778 m), weight 102.1 kg (225 lb), SpO2 100 %.  PHYSICAL EXAMINATION:  GENERAL:  56 y.o.-year-old patient lying in the bed with no acute distress.  EYES: Pupils equal, round, reactive to light and accommodation. No scleral icterus. HEENT: Head atraumatic, normocephalic. Oropharynx and nasopharynx clear.  NECK:  Supple, no jugular venous distention. No thyroid enlargement, no tenderness.  LUNGS: Normal breath sounds bilaterally, no wheezing, rales,rhonchi or crepitation. No use of accessory muscles of respiration.  CARDIOVASCULAR: S1, S2 normal. No murmurs, rubs, or gallops.  ABDOMEN: Soft, nontender, nondistended. Bowel sounds present. No organomegaly or mass.  EXTREMITIES: No pedal edema, cyanosis, or clubbing.  NEUROLOGIC: Patient able to move all his extremities on his own. Power 5 out of 5 grip strength bilaterally. Heart to  test other power. But he is able to lift both of his legs up off the bed Sensation intact. Gait not checked.  PSYCHIATRIC: The patient is alert but falls asleep easily.  SKIN: No rash, lesion, or ulcer.   LABORATORY PANEL:   CBC  Recent Labs Lab 11/07/16 1323  WBC 13.7*  HGB 7.5*  HCT 22.1*  PLT 286   ------------------------------------------------------------------------------------------------------------------  Chemistries   Recent Labs Lab 11/07/16 1323  NA 139  K 4.6  CL 111  CO2 19*  GLUCOSE 236*  BUN 36*  CREATININE 1.27*  CALCIUM 8.3*  AST 18  ALT 20  ALKPHOS 48  BILITOT 0.6   ------------------------------------------------------------------------------------------------------------------  Cardiac Enzymes  Recent Labs Lab 11/07/16 1323  TROPONINI <0.03   ------------------------------------------------------------------------------------------------------------------  RADIOLOGY:  Ct Head Wo Contrast  Result Date: 11/07/2016 CLINICAL DATA:  Facial droop and slurred speech EXAM: CT HEAD WITHOUT CONTRAST TECHNIQUE: Contiguous axial images were obtained from the base of the skull through the vertex without intravenous contrast. COMPARISON:  06/07/2016 FINDINGS: Brain:  There are changes consistent with prior left cerebellar infarct. These changes are stable from the prior exam. No acute hemorrhage, acute infarction or space-occupying mass lesion is noted. Scattered chronic white matter ischemic change is noted. Vascular: No hyperdense vessel or unexpected calcification. Skull: Normal. Negative for fracture or focal lesion. Sinuses/Orbits: No acute finding. Other: Old nasal bone fractures are noted. IMPRESSION: Changes consistent with remote left cerebellar infarct. Chronic ischemic changes are seen. No acute abnormality is noted. Electronically Signed   By: Alcide Clever M.D.   On: 11/07/2016 13:52    EKG:   Sinus tachycardia 101 bpm, LVH, flipped T waves  laterally  IMPRESSION AND PLAN:   1.  Upper GI bleed with melena. Acute hemorrhagic anemia. Hemoglobin dropped from 13.5 a few days ago to 7.5 today. Case discussed with granddaughter and she will call her mother and they will come in. Transfuse 1 unit of packed red blood cells. GI consultation. Protonix drip. Serial hemoglobins.  Hold aspirin, Plavix and naproxen. Nothing by mouth for now 2. Acute encephalopathy. Unclear etiology. Urine toxicology negative. CT scan of the head negative. Looking back at prior records did have previous strokes. MRI of the brain ordered. Neurology consultation ordered. Since not having a fever I do not think there is any reason for a lumbar puncture. 3. Essential hypertension. Hold antihypertensive medications 4. Diabetes mellitus. Sliding scale for now 5. History of GERD on Protonix drip 6. History of coronary artery disease 7. History of bipolar disorder  All the records are reviewed and case discussed with ED provider. Management plans discussed with the patient, family and they are in agreement.  CODE STATUS: Full code  TOTAL TIME TAKING CARE OF THIS PATIENT: 50 minutes.    Alford Highland M.D on 11/07/2016 at 3:09 PM  Between 7am to 6pm - Pager - [phone]  After 6pm call admission pager [phone]  [facility]  [phone]  CC: Primary care physician; Preston Fleeting, MD

## 2016-11-07 NOTE — Progress Notes (Signed)
Primary nurse notified Dr. Anne Hahn of Results of MRI. Primary nurse to continue to monitor.

## 2016-11-07 NOTE — ED Provider Notes (Signed)
[facility] Emergency Department Provider Note ____________________________________________   I have reviewed the triage vital signs and the triage nursing note.  HISTORY  Chief Complaint Altered Mental Status   Historian Patient  history limited by altered mental status. Level 5 caveat  HPI [name] [name]. is a 56 y.o. male unable to provide medical history, slurred speech and altered mental status, unable to provide history of present illness. EMS reports to [place] that someone had apparently called fire department to a park where the patient was found to be altered.  There was some report that the sister had dropped the patient off at the park, but EMS was not able to verify this and there is no family members here, and sister does not answer her phone line, and so it is unclear when the patient would have last been seen normal.  Review of chart history indicates patient has a history of psychiatric disorder, as well as diabetes and hypertension and prior stroke.  Patient shakes his head no when asked if he is any any pain.    Past Medical History:  Diagnosis Date  . Aortic stenosis, mild February 2015  . Auditory hallucinations   . Bipolar disorder (HCC)   . Coronary artery disease    No reported catheterization. Negative Myoview stress test and 2013.  Marland Kitchen Depression   . Diabetes mellitus   . GERD (gastroesophageal reflux disease)   . History of suicide attempt   . Hx of medication noncompliance   . Hypertension   . Hypertensive hypertrophic cardiomyopathy [facility]) February 2015   Severe concentric LVH on echocardiogram.  . Obesity, Class II, BMI 35-39.9, with comorbidity   . Pericarditis   . Polysubstance abuse (HCC)    cocaine, marijuana  . Stroke [facility]) February 2015   Right MCA aneurysm; head MRI - acute infarct of the right frontal lobe. Smaller acute infarct of the left frontal lobe. Thought to be due to cerebral emboli. Left PICA infarct. Small  chronic lacunar infarct right internal capsule. Chronic microvascular ischemic change with white matter change.  . Syncope and collapse     Patient Active Problem List   Diagnosis Date Noted  . Accelerated hypertension 06/07/2016  . GERD (gastroesophageal reflux disease) 06/07/2016  . Bipolar affective disorder (HCC) 06/07/2016  . Chest pain 06/07/2016  . Unspecified Depressive disorder 04/17/2016  . 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 Tobacco use disorder 11/07/2014  . Stimulant use disorder (HCC) crack cocaine 11/07/2014  . Alcohol use disorder, moderate, dependence (HCC) 11/07/2014  . CAD (coronary artery disease) 11/05/2014  . Cerebral infarction (HCC) 10/19/2014  . LVH (left ventricular hypertrophy) 05/20/2013  . Obesity (BMI 30-39.9) 04/10/2013  . Aortic stenosis, mild 03/07/2013  . DM (diabetes mellitus), type 2, uncontrolled (HCC) 01/05/2013  . Essential hypertension, malignant 01/25/2009    Past Surgical History:  Procedure Laterality Date  . Carotid Dopplers:  03/23/2013   Interval thickening of bilateral common carotid no significant plaque formation. Tortuosity but no significant stenosis. Patent bilateral vertebral  arteries  . NM MYOVIEW LTD  July 2013   No ischemia or infarct. There is inferior wall defec suggestive of diaphragmatic attenuation. Mild LV dilation at to be due to hypertensive heart disease. EF 55%. Poor exercise tolerance.  Marland Kitchen PERICARDIAL FLUID DRAINAGE    . TRANSTHORACIC ECHOCARDIOGRAM  03/23/2013   EF 5560%. Elevated left atrial pressure. Severe concentric LVH. Mild aortic stenosis. -- Appearance of hypertensive hypertrophic heart disease.  No clear  cardioembolic source    Prior to Admission medications   Medication Sig Start Date End Date Taking? Authorizing Provider  amLODipine (NORVASC) 10 MG tablet Take 1 tablet (10 mg total) by mouth daily. 04/17/16   Jimmy Footman, MD  aspirin 81 MG EC tablet  Take 1 tablet (81 mg total) by mouth daily. 04/17/16   Jimmy Footman, MD  atorvastatin (LIPITOR) 20 MG tablet Take 1 tablet (20 mg total) by mouth daily at 6 PM. 04/17/16   Jimmy Footman, MD  chlorthalidone (HYGROTON) 25 MG tablet Take 1 tablet (25 mg total) by mouth daily. 04/17/16   Jimmy Footman, MD  cloNIDine (CATAPRES) 0.2 MG tablet Take 1 tablet (0.2 mg total) by mouth 2 (two) times daily. Patient not taking: Reported on 06/07/2016 04/17/16   Jimmy Footman, MD  clopidogrel (PLAVIX) 75 MG tablet Take 1 tablet (75 mg total) by mouth daily. 04/17/16   Jimmy Footman, MD  insulin aspart (NOVOLOG) 100 UNIT/ML FlexPen Inject 10 Units into the skin 3 (three) times daily with meals. 07/26/13   [provider]  lisinopril (PRINIVIL,ZESTRIL) 10 MG tablet Take 1 tablet (10 mg total) by mouth daily. Patient not taking: Reported on 09/20/2016 06/09/16   Enedina Finner, MD  metFORMIN (GLUCOPHAGE) 500 MG tablet Take 1 tablet (500 mg total) by mouth 2 (two) times daily with a meal. 04/17/16   Jimmy Footman, MD  naproxen (NAPROSYN) 500 MG tablet Take 1 tablet (500 mg total) by mouth 2 (two) times daily with a meal. 09/20/16   Jene Every, MD  nitroGLYCERIN (NITROSTAT) 0.4 MG SL tablet Place 1 tablet (0.4 mg total) under the tongue every 5 (five) minutes as needed for chest pain. 04/17/16   Jimmy Footman, MD  oxyCODONE-acetaminophen (ROXICET) 5-325 MG tablet Take 1 tablet by mouth every 6 (six) hours as needed for severe pain. 09/24/16 09/24/17  Merrily Brittle, MD  pantoprazole (PROTONIX) 40 MG tablet Take 1 tablet (40 mg total) by mouth daily. Patient not taking: Reported on 06/07/2016 04/17/16   Jimmy Footman, MD  sertraline (ZOLOFT) 50 MG tablet Take 1 tablet (50 mg total) by mouth daily. Patient not taking: Reported on 06/07/2016 04/17/16   Jimmy Footman, MD  traZODone (DESYREL) 150 MG tablet Take 1  tablet (150 mg total) by mouth at bedtime. 04/17/16   Jimmy Footman, MD    No Known Allergies  Family History  Problem Relation Age of Onset  . Stroke Mother   . Diabetes type II Mother   . Hypertension Mother   . Hypertension Father   . Arthritis Father   . Arthritis Sister   . Diabetes type II Sister   . Hypertension Sister     Social History Social History  Substance Use Topics  . Smoking status: Former Smoker    Packs/day: 0.50    Years: 40.00    Types: Cigars  . Smokeless tobacco: Never Used  . Alcohol use No     Comment: occasionally; reports 1 pint of vodka per month at paydaysocially    Review of Systems  history limited by altered mental status Level 5 caveat ____________________________________________   PHYSICAL EXAM:  VITAL SIGNS: ED Triage Vitals  Enc Vitals Group     BP 11/07/16 1316 (!) 133/103     Pulse Rate 11/07/16 1316 (!) 102     Resp 11/07/16 1316 19     Temp 11/07/16 1316 99.4 F (37.4 C)     Temp Source 11/07/16 1316 Oral     SpO2 11/07/16  1317 100 %     Weight 11/07/16 1317 225 lb (102.1 kg)     Height 11/07/16 1317  (1.778 m)     Head Circumference --      Peak Flow --      Pain Score --      Pain Loc --      Pain Edu? --      Excl. in GC? --      Constitutional: alert to voice, and follows command when asked to open his mouth for an oral temperature. He is not really making any discernible verbal speech, garbled. HEENT   Head: Normocephalic and atraumatic.  no evidence of trauma.      Eyes: Conjunctivae are normal. Pupils equal and round.       Ears:         Nose: No congestion/rhinnorhea.   Mouth/Throat: Mucous membranes are moist.   Neck: No stridor. Cardiovascular/Chest: Normal rate, regular rhythm.  No murmurs, rubs, or gallops. Respiratory: Normal respiratory effort without tachypnea nor retractions. Breath sounds are clear and equal bilaterally. No wheezes/rales/rhonchi. Gastrointestinal:  Soft. No distention, no guarding, no rebound. Nontender.   Genitourinary/rectal: nontender rectal exam. Melena which is strongly heme positive. Musculoskeletal: Nontender with normal range of motion in all extremities. No joint effusions.  No lower extremity tenderness.  No edema. Neurologic:  no obvious facial droop, but patient does have slurred speech.  moving 4 extremities, no apparent gross neurologic deficit in the extremities. Skin:  Skin is warm, dry and intact. No rash noted. Psychiatric: no agitation.   ____________________________________________  LABS (pertinent positives/negatives) I, Governor Rooks, MD the attending physician have reviewed the labs noted below.  Labs Reviewed  URINALYSIS, COMPLETE (UACMP) WITH MICROSCOPIC - Abnormal; Notable for the following:       Result Value   Color, Urine YELLOW (*)    APPearance CLEAR (*)    Glucose, UA 50 (*)    Hgb urine dipstick SMALL (*)    Squamous Epithelial / LPF 0-5 (*)    All other components within normal limits  ACETAMINOPHEN LEVEL - Abnormal; Notable for the following:    Acetaminophen (Tylenol), Serum <10 (*)    All other components within normal limits  COMPREHENSIVE METABOLIC PANEL - Abnormal; Notable for the following:    CO2 19 (*)    Glucose, Bld 236 (*)    BUN 36 (*)    Creatinine, Ser 1.27 (*)    Calcium 8.3 (*)    All other components within normal limits  CBC WITH DIFFERENTIAL/PLATELET - Abnormal; Notable for the following:    WBC 13.7 (*)    RBC 2.43 (*)    Hemoglobin 7.5 (*)    HCT 22.1 (*)    Neutro Abs 10.9 (*)    All other components within normal limits  URINE DRUG SCREEN, QUALITATIVE ([facility] ONLY)  ETHANOL  SALICYLATE LEVEL  TROPONIN I  LACTIC ACID, PLASMA  PROTIME-INR  ETHANOL  TYPE AND SCREEN    ____________________________________________    EKG I, Governor Rooks, MD, the attending physician have personally viewed and interpreted all ECGs.  101 bpm. Sinus tachycardia. Narrow QRS.  Normal axis. T-wave is inverted 1 and 2 and laterally. Likely LVH with repolarization change. ____________________________________________  RADIOLOGY All Xrays were viewed by me.  Imaging interpreted by Radiologist, and I, Governor Rooks, MD the attending physician have reviewed the radiologist interpretation noted below.  Ct head without contrast:  IMPRESSION: Changes consistent with remote left cerebellar infarct.  Chronic ischemic changes are seen.  No acute abnormality is noted. __________________________________________  PROCEDURES  Procedure(s) performed: None  Critical Care performed: CRITICAL CARE Performed by: Governor Rooks   Total critical care time: 30 minutes  Critical care time was exclusive of separately billable procedures and treating other patients.  Critical care was necessary to treat or prevent imminent or life-threatening deterioration.  Critical care was time spent personally by me on the following activities: development of treatment plan with patient and/or surrogate as well as nursing, discussions with consultants, evaluation of patient's response to treatment, examination of patient, obtaining history from patient or surrogate, ordering and performing treatments and interventions, ordering and review of laboratory studies, ordering and review of radiographic studies, pulse oximetry and re-evaluation of patient's condition.   ____________________________________________  No current facility-administered medications on file prior to encounter.    Current Outpatient Prescriptions on File Prior to Encounter  Medication Sig Dispense Refill  . amLODipine (NORVASC) 10 MG tablet Take 1 tablet (10 mg total) by mouth daily. 30 tablet 0  . aspirin 81 MG EC tablet Take 1 tablet (81 mg total) by mouth daily. 30 tablet 0  . atorvastatin (LIPITOR) 20 MG tablet Take 1 tablet (20 mg total) by mouth daily at 6 PM. 30 tablet 0  . chlorthalidone (HYGROTON) 25 MG tablet  Take 1 tablet (25 mg total) by mouth daily. 30 tablet 0  . cloNIDine (CATAPRES) 0.2 MG tablet Take 1 tablet (0.2 mg total) by mouth 2 (two) times daily. (Patient not taking: Reported on 06/07/2016) 60 tablet 0  . clopidogrel (PLAVIX) 75 MG tablet Take 1 tablet (75 mg total) by mouth daily. 30 tablet 0  . insulin aspart (NOVOLOG) 100 UNIT/ML FlexPen Inject 10 Units into the skin 3 (three) times daily with meals.    Marland Kitchen lisinopril (PRINIVIL,ZESTRIL) 10 MG tablet Take 1 tablet (10 mg total) by mouth daily. (Patient not taking: Reported on 09/20/2016) 30 tablet 1  . metFORMIN (GLUCOPHAGE) 500 MG tablet Take 1 tablet (500 mg total) by mouth 2 (two) times daily with a meal. 60 tablet 0  . naproxen (NAPROSYN) 500 MG tablet Take 1 tablet (500 mg total) by mouth 2 (two) times daily with a meal. 20 tablet 2  . nitroGLYCERIN (NITROSTAT) 0.4 MG SL tablet Place 1 tablet (0.4 mg total) under the tongue every 5 (five) minutes as needed for chest pain. 15 tablet 0  . oxyCODONE-acetaminophen (ROXICET) 5-325 MG tablet Take 1 tablet by mouth every 6 (six) hours as needed for severe pain. 9 tablet 0  . pantoprazole (PROTONIX) 40 MG tablet Take 1 tablet (40 mg total) by mouth daily. (Patient not taking: Reported on 06/07/2016) 30 tablet 0  . sertraline (ZOLOFT) 50 MG tablet Take 1 tablet (50 mg total) by mouth daily. (Patient not taking: Reported on 06/07/2016) 30 tablet 0  . traZODone (DESYREL) 150 MG tablet Take 1 tablet (150 mg total) by mouth at bedtime. 30 tablet 0    ____________________________________________  ED COURSE / ASSESSMENT AND PLAN  Pertinent labs & imaging results that were available during my care of the patient were reviewed by me and considered in my medical decision making (see chart for details).   Patient was brought in altered, he had slurred and garbled speech. Unclear to me whether or not he was intoxicated or under the influence of drugs, potentially postictal, or had acute stroke or other  intracranial abdomen rounded, versus metabolic derangement creating his altered mental status.  There is no  corroborating information to find out when he was last normal. Patient was started as a workup for possibility of acute stroke, but again no known last normal time.  He does not have other focal neurologic findings other than the slurred speech, and so it's unclear to me whether or not this would be the most likely cause or not.  patient is more than likely going to need an MRI.  Patient found to be acutely anemic with hemoglobin dropped from 13-7.5 in just 3 days, from 10/12 today 10/4.  Rectal exam was performed and was melena.  Patient is hemodynamically stable. I did add on a type and screen. I will discuss with hospitalist whether or not to initiate transfusion at 7.5.  Indicates that he is having a stroke, patient certainly not have any acute thrombolytic given the active GI bleed.  Medication list indicates patient is on aspirin and Plavix.  Again at present patient is stable hemodynamically, discussed with Dr. Hilton Sinclair.  Will monitor patient closely.  Starting on protonix bolus and drip.  DIFFERENTIAL DIAGNOSIS: Differential diagnosis includes, but is not limited to, alcohol, illicit or prescription medications, or other toxic ingestion; intracranial pathology such as stroke or intracerebral hemorrhage; fever or infectious causes including sepsis; hypoxemia and/or hypercarbia; uremia; trauma; endocrine related disorders such as diabetes, hypoglycemia, and thyroid-related diseases; hypertensive encephalopathy; etc.   CONSULTATIONS:  Hospitalist for admission.  Patient / Family / Caregiver informed of clinical course, medical decision-making process, and agree with plan.  ___________________________________________   FINAL CLINICAL IMPRESSION(S) / ED DIAGNOSES   Final diagnoses:  Acute renal failure, unspecified acute renal failure type (HCC)  Altered mental status,  unspecified altered mental status type  Acute anemia  Melena  Slurred speech              Note: This dictation was prepared with Dragon dictation. Any transcriptional errors that result from this process are unintentional    Governor Rooks, MD 11/07/16 1436

## 2016-11-07 NOTE — Progress Notes (Addendum)
Pt with orders for MRI. Pt is currently concerning for symptoms of stroke.Pupils equal and reactive. Speech slurred and drips are moderate to weak. Pt alert and following commands. Report has been given to MRI and family has been called regarding his condition and necessity for MRI. Family was able to answer all of the questions from the MRI dept.Ativan ordered to be given just before MRI. Pt mental status is improving but currently not at baseline.  Nursing supervisor notified, MD notified. MD suggested not to call code stroke since pt is concerning for GI bleed and his blood thinners have been held so there were no further interventions at this point.  Called MRI again who stated that pt was "pushed back" since there was an ER pt now ahead of them. MD changed order for MRI to STAT. Also, MRI instructed nursing staff to hold blood transfusion until MRI could be started so that the transfusion would not have to be stopped mid way through for the MRI. Blood consent signed by daughter who is at bedside since pt is not at decision making capacity. Neurology consult also in place.

## 2016-11-08 ENCOUNTER — Encounter
Admission: EM | Disposition: A | Discharge status: Skilled Nursing Facility | Payer: Self-pay | Source: Home / Self Care | Attending: Internal Medicine

## 2016-11-08 ENCOUNTER — Inpatient Hospital Stay: Payer: Medicaid Other

## 2016-11-08 DIAGNOSIS — I639 Cerebral infarction, unspecified: Secondary | ICD-10-CM

## 2016-11-08 LAB — BASIC METABOLIC PANEL
Anion gap: 5 (ref 5–15)
BUN: 31 mg/dL — AB (ref 6–20)
CALCIUM: 8.1 mg/dL — AB (ref 8.9–10.3)
CO2: 21 mmol/L — AB (ref 22–32)
Chloride: 115 mmol/L — ABNORMAL HIGH (ref 101–111)
Creatinine, Ser: 1.22 mg/dL (ref 0.61–1.24)
GFR calc Af Amer: >60 mL/min (ref >60)
GLUCOSE: 137 mg/dL — AB (ref 65–99)
Potassium: 3.5 mmol/L (ref 3.5–5.1)
Sodium: 141 mmol/L (ref 135–145)

## 2016-11-08 LAB — CBC
HEMATOCRIT: 22.1 % — AB (ref 40.0–52.0)
Hemoglobin: 7.8 g/dL — ABNORMAL LOW (ref 13.0–18.0)
MCH: 32 pg (ref 26.0–34.0)
MCHC: 35.3 g/dL (ref 32.0–36.0)
MCV: 90.8 fL (ref 80.0–100.0)
Platelets: 244 10*3/uL (ref 150–440)
RBC: 2.43 MIL/uL — ABNORMAL LOW (ref 4.40–5.90)
RDW: 13.2 % (ref 11.5–14.5)
WBC: 13.3 10*3/uL — ABNORMAL HIGH (ref 3.8–10.6)

## 2016-11-08 LAB — GLUCOSE, CAPILLARY
GLUCOSE-CAPILLARY: 115 mg/dL — AB (ref 65–99)
Glucose-Capillary: 133 mg/dL — ABNORMAL HIGH (ref 65–99)
Glucose-Capillary: 140 mg/dL — ABNORMAL HIGH (ref 65–99)
Glucose-Capillary: 102 mg/dL — ABNORMAL HIGH (ref 65–99)
Glucose-Capillary: 112 mg/dL — ABNORMAL HIGH (ref 65–99)

## 2016-11-08 LAB — FOLATE RBC
FOLATE, HEMOLYSATE: 439.6 ng/mL
Folate, RBC: 2054 ng/mL (ref >498)
HEMATOCRIT: 21.4 % — AB (ref 37.5–51.0)

## 2016-11-08 LAB — HEMOGLOBIN
HEMOGLOBIN: 8.3 g/dL — AB (ref 13.0–18.0)
Hemoglobin: 9.1 g/dL — ABNORMAL LOW (ref 13.0–18.0)

## 2016-11-08 LAB — PREPARE RBC (CROSSMATCH)

## 2016-11-08 SURGERY — EGD (ESOPHAGOGASTRODUODENOSCOPY)
Anesthesia: General

## 2016-11-08 MED ORDER — VITAMIN B-1 100 MG PO TABS
100.0000 mg | ORAL_TABLET | Freq: Every day | ORAL | Status: DC
  Administered 2016-11-11 – 2016-11-15 (×5): 100 mg via ORAL
  Filled 2016-11-08 (×5): qty 1

## 2016-11-08 MED ORDER — FOLIC ACID 1 MG PO TABS
1.0000 mg | ORAL_TABLET | Freq: Every day | ORAL | Status: DC
  Administered 2016-11-11 – 2016-11-15 (×5): 1 mg via ORAL
  Filled 2016-11-08 (×5): qty 1

## 2016-11-08 MED ORDER — LORAZEPAM 2 MG/ML IJ SOLN
INTRAMUSCULAR | Status: AC
  Filled 2016-11-08: qty 1

## 2016-11-08 MED ORDER — SODIUM CHLORIDE 0.9 % IV SOLN
Freq: Once | INTRAVENOUS | Status: AC
  Administered 2016-11-08: 12:00:00 via INTRAVENOUS

## 2016-11-08 MED ORDER — ADULT MULTIVITAMIN W/MINERALS CH
1.0000 | ORAL_TABLET | Freq: Every day | ORAL | Status: DC
  Administered 2016-11-11 – 2016-11-15 (×5): 1 via ORAL
  Filled 2016-11-08 (×5): qty 1

## 2016-11-08 MED ORDER — LORAZEPAM 2 MG/ML IJ SOLN
0.5000 mg | INTRAMUSCULAR | Status: DC | PRN
  Administered 2016-11-08 – 2016-11-09 (×2): 0.5 mg via INTRAVENOUS
  Filled 2016-11-08: qty 1

## 2016-11-08 MED ORDER — THIAMINE HCL 100 MG/ML IJ SOLN
100.0000 mg | Freq: Every day | INTRAMUSCULAR | Status: DC
  Administered 2016-11-09 – 2016-11-10 (×2): 100 mg via INTRAVENOUS
  Filled 2016-11-08 (×7): qty 1

## 2016-11-08 MED ORDER — LORAZEPAM 2 MG/ML IJ SOLN
1.0000 mg | Freq: Four times a day (QID) | INTRAMUSCULAR | Status: DC | PRN
  Administered 2016-11-08 – 2016-11-11 (×6): 1 mg via INTRAVENOUS
  Filled 2016-11-08 (×6): qty 1

## 2016-11-08 MED ORDER — LORAZEPAM 1 MG PO TABS: 1.0000 mg | ORAL_TABLET | Freq: Four times a day (QID) | ORAL | Status: DC | PRN

## 2016-11-08 NOTE — Progress Notes (Signed)
Primary Nurse spoke to Dr. Sheryle Hail in regard to pts AMS and trying to get out of the bed. Orders received for ativan. Primary nurse to continue to monitor.

## 2016-11-08 NOTE — Consult Note (Signed)
Referring Physician: Allena Katz    Chief Complaint: AMS  HPI: [name] [name]. is an 56 y.o. male with a history of multiple medical problems who is unable to provide any history this morning due to mental status.  Family not available therefore all history obtained from the chart.  It appears that the [place] park by [place].  It seems that he had been altered for at least a couple of days.  On admission also found to be anemic.  Patient at this point is nonverbal.  Date last known well: Unable to determine Time last known well: Unable to determine tPA Given: No: Unable to determine LKW  Past Medical History:  Diagnosis Date  . Aortic stenosis, mild February 2015  . Auditory hallucinations   . Bipolar disorder (HCC)   . Coronary artery disease    No reported catheterization. Negative Myoview stress test and 2013.  Marland Kitchen Depression   . Diabetes mellitus   . GERD (gastroesophageal reflux disease)   . History of suicide attempt   . Hx of medication noncompliance   . Hypertension   . Hypertensive hypertrophic cardiomyopathy [facility]) February 2015   Severe concentric LVH on echocardiogram.  . Obesity, Class II, BMI 35-39.9, with comorbidity   . Pericarditis   . Polysubstance abuse (HCC)    cocaine, marijuana  . Stroke [facility]) February 2015   Right MCA aneurysm; head MRI - acute infarct of the right frontal lobe. Smaller acute infarct of the left frontal lobe. Thought to be due to cerebral emboli. Left PICA infarct. Small chronic lacunar infarct right internal capsule. Chronic microvascular ischemic change with white matter change.  . Syncope and collapse     Past Surgical History:  Procedure Laterality Date  . Carotid Dopplers:  03/23/2013   Interval thickening of bilateral common carotid no significant plaque formation. Tortuosity but no significant stenosis. Patent bilateral vertebral  arteries  . NM MYOVIEW LTD  July 2013   No ischemia or infarct. There is inferior wall  defec suggestive of diaphragmatic attenuation. Mild LV dilation at to be due to hypertensive heart disease. EF 55%. Poor exercise tolerance.  Marland Kitchen PERICARDIAL FLUID DRAINAGE    . TRANSTHORACIC ECHOCARDIOGRAM  03/23/2013   EF 5560%. Elevated left atrial pressure. Severe concentric LVH. Mild aortic stenosis. -- Appearance of hypertensive hypertrophic heart disease.  No clear cardioembolic source    Family History  Problem Relation Age of Onset  . Stroke Mother   . Diabetes type II Mother   . Hypertension Mother   . Hypertension Father   . Arthritis Father   . Arthritis Sister   . Diabetes type II Sister   . Hypertension Sister    Social History:  reports that he has quit smoking. His smoking use included Cigars. He has a 20.00 pack-year smoking history. He has never used smokeless tobacco. He reports that he uses drugs, including Marijuana and Cocaine, about 1 time per week. He reports that he does not drink alcohol.  Allergies: No Known Allergies  Medications:  I have reviewed the patient's current medications. Prior to Admission:  Prescriptions Prior to Admission  Medication Sig Dispense Refill Last Dose  . amLODipine (NORVASC) 10 MG tablet Take 1 tablet (10 mg total) by mouth daily. 30 tablet 0 Past Week at Unknown time  . aspirin 81 MG EC tablet Take 1 tablet (81 mg total) by mouth daily. 30 tablet 0 Past Week at Unknown time  . atorvastatin (LIPITOR) 20 MG tablet  Take 1 tablet (20 mg total) by mouth daily at 6 PM. 30 tablet 0 Past Week at Unknown time  . chlorthalidone (HYGROTON) 25 MG tablet Take 1 tablet (25 mg total) by mouth daily. 30 tablet 0 Past Week at Unknown time  . clopidogrel (PLAVIX) 75 MG tablet Take 1 tablet (75 mg total) by mouth daily. 30 tablet 0 Past Week at Unknown time  . insulin aspart (NOVOLOG) 100 UNIT/ML FlexPen Inject 10 Units into the skin 3 (three) times daily with meals.   Past Week at Unknown time  . metFORMIN (GLUCOPHAGE) 500 MG tablet Take 1 tablet (500  mg total) by mouth 2 (two) times daily with a meal. 60 tablet 0 Past Week at Unknown time  . naproxen (NAPROSYN) 500 MG tablet Take 1 tablet (500 mg total) by mouth 2 (two) times daily with a meal. 20 tablet 2 Past Week at Unknown time  . nitroGLYCERIN (NITROSTAT) 0.4 MG SL tablet Place 1 tablet (0.4 mg total) under the tongue every 5 (five) minutes as needed for chest pain. 15 tablet 0 PRN at PRN  . traZODone (DESYREL) 150 MG tablet Take 1 tablet (150 mg total) by mouth at bedtime. 30 tablet 0 Past Month at Unknown time   Scheduled: . acetaminophen  650 mg Oral Once  . insulin aspart  0-5 Units Subcutaneous QHS  . insulin aspart  0-9 Units Subcutaneous TID WC  . LORazepam      . [START ON 11/11/2016] pantoprazole  40 mg Intravenous Q12H  . traZODone  150 mg Oral QHS    ROS: Unable to provide due to mental status  Physical Examination: Blood pressure (!) 144/96, pulse 99, temperature 98.4 F (36.9 C), temperature source Oral, resp. rate 20, height  (1.778 m), weight 102.1 kg (225 lb), SpO2 100 %.  HEENT-  Normocephalic, no lesions, without obvious abnormality.  Normal external eye and conjunctiva.  Normal TM's bilaterally.  Normal auditory canals and external ears. Normal external nose, mucus membranes and septum.  Normal pharynx. Cardiovascular- S1, S2 normal, pulses palpable throughout   Lungs- chest clear, no wheezing, rales, normal symmetric air entry Abdomen- soft, non-tender; bowel sounds normal; no masses,  no organomegaly Extremities- no edema Lymph-no adenopathy palpable Musculoskeletal-no joint tenderness, deformity or swelling Skin-warm and dry, no hyperpigmentation, vitiligo, or suspicious lesions  Neurological Examination   Mental Status: Lethargic.  Nonverbal.  Does not follow commands.  Localizes to deep sternal rub with the left hand Cranial Nerves: II: Discs flat bilaterally; Does not blink to bilateral confrontation.  Pupils equal, round, reactive to light and  accommodation III,IV, VI: ptosis not present, extra-ocular motions intact bilaterally V,VII: possible right facial droop, corneals intact bilaterally VIII: hearing normal bilaterally IX,X: gag reflex unable to be  tested XI: unable to be tested XII: unable to be tested Motor: Moves all extremities but preferentially moves the left and moves the left more than the right Sensory: Does not respond to noxious stimuli throughout  Deep Tendon Reflexes: 2+ and symmetric with absent AJ's bilaterally Plantars: Right: downgoing   Left: downgoing Cerebellar: Unable to test due to mental status Gait: not tested due to safety concerns   Laboratory Studies:  Basic Metabolic Panel:  Recent Labs Lab 11/04/16 1332 11/07/16 1323 11/08/16 0213  NA 137 139 141  K 4.3 4.6 3.5  CL 105 111 115*  CO2 25 19* 21*  GLUCOSE 151* 236* 137*  BUN 31* 36* 31*  CREATININE 1.20 1.27* 1.22  CALCIUM 8.6* 8.3*  8.1*    Liver Function Tests:  Recent Labs Lab 11/04/16 1647 11/07/16 1323  AST 21 18  ALT 20 20  ALKPHOS 54 48  BILITOT 0.6 0.6  PROT 6.3* 6.5  ALBUMIN 3.5 3.6    Recent Labs Lab 11/04/16 1647  LIPASE 18   No results for input(s): AMMONIA in the last 168 hours.  CBC:  Recent Labs Lab 11/04/16 1332 11/07/16 1323 11/07/16 1759 11/08/16 0213  WBC 10.3 13.7*  --  13.3*  NEUTROABS  --  10.9*  --   --   HGB 13.5 7.5* 7.4* 7.8*  HCT 37.7* 22.1*  --  22.1*  MCV 89.2 91.1  --  90.8  PLT 245 286  --  244    Cardiac Enzymes:  Recent Labs Lab 11/04/16 1332 11/04/16 1647 11/07/16 1323  TROPONINI 0.04* 0.04* <0.03    BNP: Invalid input(s): POCBNP  CBG:  Recent Labs Lab 11/07/16 1725 11/07/16 2015 11/08/16 0611 11/08/16 0736  GLUCAP 135* 156* 133* 140*    Microbiology: Results for orders placed or performed during the hospital encounter of 11/07/16  MRSA PCR Screening     Status: None   Collection Time: 11/07/16  5:30 PM  Result Value Ref Range Status   MRSA  by PCR NEGATIVE NEGATIVE Final    Comment:        The GeneXpert MRSA Assay (FDA approved for NASAL specimens only), is one component of a comprehensive MRSA colonization surveillance program. It is not intended to diagnose MRSA infection nor to guide or monitor treatment for MRSA infections.     Coagulation Studies:  Recent Labs  11/07/16 1323  LABPROT 15.0  INR 1.19    Urinalysis:  Recent Labs Lab 11/07/16 1323  COLORURINE YELLOW*  LABSPEC 1.025  PHURINE 5.0  GLUCOSEU 50*  HGBUR SMALL*  BILIRUBINUR NEGATIVE  KETONESUR NEGATIVE  PROTEINUR NEGATIVE  NITRITE NEGATIVE  LEUKOCYTESUR NEGATIVE    Lipid Panel:    Component Value Date/Time   CHOL 92 04/17/2016 0631   CHOL 128 05/13/2013 0405   TRIG 81 04/17/2016 0631   TRIG 175 05/13/2013 0405   HDL 23 (L) 04/17/2016 0631   HDL 19 (L) 05/13/2013 0405   CHOLHDL 4.0 04/17/2016 0631   VLDL 16 04/17/2016 0631   VLDL 35 05/13/2013 0405   LDLCALC 53 04/17/2016 0631   LDLCALC 74 05/13/2013 0405    HgbA1C:  Lab Results  Component Value Date   HGBA1C 6.6 (H) 06/08/2016    Urine Drug Screen:     Component Value Date/Time   LABOPIA NONE DETECTED 11/07/2016 1323   LABOPIA NONE DETECTED 01/06/2013 0814   COCAINSCRNUR NONE DETECTED 11/07/2016 1323   LABBENZ NONE DETECTED 11/07/2016 1323   LABBENZ NONE DETECTED 01/06/2013 0814   AMPHETMU NONE DETECTED 11/07/2016 1323   AMPHETMU NONE DETECTED 01/06/2013 0814   THCU NONE DETECTED 11/07/2016 1323   THCU NONE DETECTED 01/06/2013 0814   LABBARB NONE DETECTED 11/07/2016 1323   LABBARB NONE DETECTED 01/06/2013 0814    Alcohol Level:  Recent Labs Lab 11/07/16 1323  ETH <10    Other results: EKG: sinus tachycardia at 101 bpm.  Imaging: Ct Head Wo Contrast  Result Date: 11/07/2016 CLINICAL DATA:  Facial droop and slurred speech EXAM: CT HEAD WITHOUT CONTRAST TECHNIQUE: Contiguous axial images were obtained from the base of the skull through the vertex without  intravenous contrast. COMPARISON:  06/07/2016 FINDINGS: Brain: There are changes consistent with prior left cerebellar infarct. These changes are stable from  the prior exam. No acute hemorrhage, acute infarction or space-occupying mass lesion is noted. Scattered chronic white matter ischemic change is noted. Vascular: No hyperdense vessel or unexpected calcification. Skull: Normal. Negative for fracture or focal lesion. Sinuses/Orbits: No acute finding. Other: Old nasal bone fractures are noted. IMPRESSION: Changes consistent with remote left cerebellar infarct. Chronic ischemic changes are seen. No acute abnormality is noted. Electronically Signed   By: Alcide Clever M.D.   On: 11/07/2016 13:52   Mr Brain Wo Contrast  Result Date: 11/07/2016 CLINICAL DATA:  Altered mental status. Upper GI bleed with melena, acute hemorrhagic anemia. EXAM: MRI HEAD WITHOUT CONTRAST TECHNIQUE: Multiplanar, multiecho pulse sequences of the brain and surrounding structures were obtained without intravenous contrast. The patient could only complete a portion of the exam. COMPARISON:  CT earlier today.  MR head 03/10/2016. FINDINGS: Brain: Multifocal areas of restricted diffusion, corresponding low ADC, affecting the LEFT greater than RIGHT cerebral hemispheres, predominantly the subcortical and periventricular deep white matter. The pattern of infarction is fairly linear, from front to back, lying between the vascular territories of the anterior/middle and middle/ posterior cerebral arteries. There is some involvement of the LEFT lentiform nucleus, possibly the LEFT caudate. No brainstem or cerebellar involvement. No definite acute or chronic hemorrhage. Findings are most consistent with acute watershed type infarction. Multifocal emboli are less favored, particularly given the lack of cortical involvement. There is advanced atrophy with chronic microvascular ischemic change. There is an old LEFT cerebellar infarct. There may be an  old RIGHT frontal infarct. Vascular: Unable to assess due to motion. Skull and upper cervical spine: Unable to assess. Sinuses/Orbits: Negative. Other: None. IMPRESSION: Motion degraded exam which is diagnostic but prematurely truncated. Multifocal areas of acute nonhemorrhagic infarction, LEFT greater than RIGHT, primarily affecting the deep white matter, supratentorial in location, which are most consistent with a watershed pattern of injury, possibly due to hypotension or cardiac arrhythmia. See discussion above. Advanced atrophy with chronic microvascular ischemic change. Evidence for remote cerebral infarction. Electronically Signed   By: Elsie Stain M.D.   On: 11/07/2016 21:42    Assessment: 56 y.o. male with multiple medical problems presenting with altered mental status.  MRI of the brain reviewed and shows multiple small acute infarcts bilaterally in watershed areas.  Chronic infarcts noted as well.  No hypotension noted since admission but patient anemic.  Patient on ASA and Plavix prior to admission.  Unclear compliance.  Due to likelihood of active GI bleeding will not be able to remain on anticoagulation at this time.  Last echo in May showed a mildly depressed EF at 45%.    Stroke Risk Factors - diabetes mellitus and hypertension  Plan: 1. HgbA1c, fasting lipid panel 2. Frequent neuro checks 3. PT consult, OT consult, Speech consult 4. Echocardiogram 5. Carotid dopplers 6. Prophylactic therapy-None 7. NPO until RN stroke swallow screen 8. Telemetry monitoring 9.  Patient more lethargic today but has not previously been sleeping.  If no improvement as the day progresses amy need to repeat head CT.     Thana Farr, MD Neurology [phone] 11/08/2016, 10:18 AM

## 2016-11-08 NOTE — Progress Notes (Signed)
[facility] - [place] [facility]   PATIENT NAME: [name] [name]    MR#:  [id number]  DATE OF BIRTH:  1960-03-07  SUBJECTIVE:  Found with AMS in a park  hgb down to 7.4. Pt remained confused overnight requiring ativan. Sitter+ No family in the room  REVIEW OF SYSTEMS:   Review of Systems  Unable to perform ROS: Mental status change   Tolerating Diet:npo Tolerating PT: pending  DRUG ALLERGIES:  No Known Allergies  VITALS:  Blood pressure (!) 146/89, pulse 78, temperature 98.7 F (37.1 C), resp. rate 20, height  (1.778 m), weight 102.1 kg (225 lb), SpO2 100 %.  PHYSICAL EXAMINATION:   Physical Exam  GENERAL:  56 y.o.-year-old patient lying in the bed with no acute distress. sedated EYES: Pupils equal, round, reactive to light and accommodation. No scleral icterus. Extraocular muscles intact.  HEENT: Head atraumatic, normocephalic. Oropharynx and nasopharynx clear.  NECK:  Supple, no jugular venous distention. No thyroid enlargement, no tenderness.  LUNGS: Normal breath sounds bilaterally, no wheezing, rales, rhonchi. No use of accessory muscles of respiration.  CARDIOVASCULAR: S1, S2 normal. No murmurs, rubs, or gallops.  ABDOMEN: Soft, nontender, nondistended. Bowel sounds present. No organomegaly or mass.  EXTREMITIES: No cyanosis, clubbing or edema b/l.    NEUROLOGIC: moves all extremities PSYCHIATRIC:  patient is sedated SKIN: No obvious rash, lesion, or ulcer.   LABORATORY PANEL:  CBC  Recent Labs Lab 11/08/16 0213  WBC 13.3*  HGB 7.8*  HCT 22.1*  PLT 244    Chemistries   Recent Labs Lab 11/07/16 1323 11/08/16 0213  NA 139 141  K 4.6 3.5  CL 111 115*  CO2 19* 21*  GLUCOSE 236* 137*  BUN 36* 31*  CREATININE 1.27* 1.22  CALCIUM 8.3* 8.1*  AST 18  --   ALT 20  --   ALKPHOS 48  --   BILITOT 0.6  --    Cardiac Enzymes  Recent Labs Lab 11/07/16 1323  TROPONINI <0.03   RADIOLOGY:  Ct Head Wo  Contrast  Result Date: 11/07/2016 CLINICAL DATA:  Facial droop and slurred speech EXAM: CT HEAD WITHOUT CONTRAST TECHNIQUE: Contiguous axial images were obtained from the base of the skull through the vertex without intravenous contrast. COMPARISON:  06/07/2016 FINDINGS: Brain: There are changes consistent with prior left cerebellar infarct. These changes are stable from the prior exam. No acute hemorrhage, acute infarction or space-occupying mass lesion is noted. Scattered chronic white matter ischemic change is noted. Vascular: No hyperdense vessel or unexpected calcification. Skull: Normal. Negative for fracture or focal lesion. Sinuses/Orbits: No acute finding. Other: Old nasal bone fractures are noted. IMPRESSION: Changes consistent with remote left cerebellar infarct. Chronic ischemic changes are seen. No acute abnormality is noted. Electronically Signed   By: Alcide Clever M.D.   On: 11/07/2016 13:52   Mr Brain Wo Contrast  Result Date: 11/07/2016 CLINICAL DATA:  Altered mental status. Upper GI bleed with melena, acute hemorrhagic anemia. EXAM: MRI HEAD WITHOUT CONTRAST TECHNIQUE: Multiplanar, multiecho pulse sequences of the brain and surrounding structures were obtained without intravenous contrast. The patient could only complete a portion of the exam. COMPARISON:  CT earlier today.  MR head 03/10/2016. FINDINGS: Brain: Multifocal areas of restricted diffusion, corresponding low ADC, affecting the LEFT greater than RIGHT cerebral hemispheres, predominantly the subcortical and periventricular deep white matter. The pattern of infarction is fairly linear, from front to back, lying between the vascular territories of the anterior/middle and middle/ posterior  cerebral arteries. There is some involvement of the LEFT lentiform nucleus, possibly the LEFT caudate. No brainstem or cerebellar involvement. No definite acute or chronic hemorrhage. Findings are most consistent with acute watershed type infarction.  Multifocal emboli are less favored, particularly given the lack of cortical involvement. There is advanced atrophy with chronic microvascular ischemic change. There is an old LEFT cerebellar infarct. There may be an old RIGHT frontal infarct. Vascular: Unable to assess due to motion. Skull and upper cervical spine: Unable to assess. Sinuses/Orbits: Negative. Other: None. IMPRESSION: Motion degraded exam which is diagnostic but prematurely truncated. Multifocal areas of acute nonhemorrhagic infarction, LEFT greater than RIGHT, primarily affecting the deep white matter, supratentorial in location, which are most consistent with a watershed pattern of injury, possibly due to hypotension or cardiac arrhythmia. See discussion above. Advanced atrophy with chronic microvascular ischemic change. Evidence for remote cerebral infarction. Electronically Signed   By: Elsie Stain M.D.   On: 11/07/2016 21:42   ASSESSMENT AND PLAN:  [name] [name]  is a 56 y.o. male brought in from a park for altered mental status.  Patient's hemoglobin a couple days ago was 13.5 and today at 7.5. Rectal exam showed melena   1.Upper GI bleed with melena. Acute hemorrhagic anemia. Hemoglobin dropped from 13.5 a few days ago to 7.5 today.  -13.5 (prior hgb)---admitted with 7.4--1 unit BT--7.8--2nd unit today - cont IV Protonix drip. Serial hemoglobins.   -Hold aspirin, Plavix and naproxen. Nothing by mouth for now -EGD on hold  2. Acute encephalopathy due to acute CVA -MRI positive -prn ativan and sitter for AMS in the setting of CVA - Urine toxicology negative. CT scan of the head negative. -echo done -Neurology consult -ASA and plavix held due to GI bleed -will need to resume statins when able to take po meds  3. Essential hypertension. Hold antihypertensive medications  4. Diabetes mellitus. Sliding scale for now  5. History of GERD on Protonix drip  6. History of coronary artery disease  7. History of bipolar  disorder  Left message for Melvern Banker [phone]  Case discussed with Care Management/Social Worker. Management plans discussed with the patient, family and they are in agreement.  CODE STATUS: FULL  DVT Prophylaxis: SCD/TEDs (due to GI bleed)  TOTAL TIME TAKING CARE OF THIS PATIENT: *30* minutes.  >50% time spent on counselling and coordination of care  POSSIBLE D/C IN few* DAYS, DEPENDING ON CLINICAL CONDITION.  Note: This dictation was prepared with Dragon dictation along with smaller phrase technology. Any transcriptional errors that result from this process are unintentional.  Rosalea Withrow M.D on 11/08/2016 at 7:55 AM  Between 7am to 6pm - Pager - [phone]  After 6pm go to [email or web address] - password [email or web address]  [facility]  Office  [phone]  CC: Primary care physician; Preston Fleeting, MDPatient ID: Jeanice Lim., male   DOB: [birth year], 56 y.o.   MRN: [id number]

## 2016-11-08 NOTE — Progress Notes (Signed)
Asked Dr Allena Katz to call Pt's Daughter Wane Mollett for an update per her request

## 2016-11-08 NOTE — Progress Notes (Signed)
Patient ID: [name] [name]., male   DOB: 1961-01-04, 56 y.o.   MRN: [id number]  Left a message for Ember Gottwald again ---did not pick up the phone [phone]

## 2016-11-08 NOTE — Progress Notes (Addendum)
Arlyss Repress, MD [address]  [address]  [place], [place] [address]  Main: [phone]  Fax: [phone] Pager: [phone]   [name] Hase. is being followed for melena and acute anemia Day 1 of follow up    Subjective: MRI revealed acute ischemic infarcts, bilateral No evidence of melena since admission, hemoglobin stable Patient is nonverbal, awake, not answering questions  Objective: Vital signs in last 24 hours: Vitals:   11/08/16 0510 11/08/16 0836 11/08/16 1239 11/08/16 1255  BP: (!) 146/89 (!) 144/96 135/90 121/64  Pulse: 78 99 95 91  Resp: Temp: 98.7 F (37.1 C) 98.4 F (36.9 C) 98.3 F (36.8 C) 98.3 F (36.8 C)  TempSrc:  Oral Axillary Axillary  SpO2: 100% 100% 100% 92%  Weight:      Height:       Weight change:   Intake/Output Summary (Last 24 hours) at 11/08/16 1412 Last data filed at 11/08/16 0720  Gross per 24 hour  Intake           784.25 ml  Output                0 ml  Net           784.25 ml     Exam: Heart:: Regular rate and rhythm or S1S2 present Lungs: clear to auscultation Abdomen: soft, nontender, normal bowel sounds   Lab Results: @ Micro Results: Recent Results (from the past 240 hour(s))  MRSA PCR Screening     Status: None   Collection Time: 11/07/16  5:30 PM  Result Value Ref Range Status   MRSA by PCR NEGATIVE NEGATIVE Final    Comment:        The GeneXpert MRSA Assay (FDA approved for NASAL specimens only), is one component of a comprehensive MRSA colonization surveillance program. It is not intended to diagnose MRSA infection nor to guide or monitor treatment for MRSA infections.    Studies/Results: Ct Head Wo Contrast  Result Date: 11/07/2016 CLINICAL DATA:  Facial droop and slurred speech EXAM: CT HEAD WITHOUT CONTRAST TECHNIQUE: Contiguous axial images were obtained from the base of the skull through the vertex without intravenous contrast. COMPARISON:  06/07/2016 FINDINGS:  Brain: There are changes consistent with prior left cerebellar infarct. These changes are stable from the prior exam. No acute hemorrhage, acute infarction or space-occupying mass lesion is noted. Scattered chronic white matter ischemic change is noted. Vascular: No hyperdense vessel or unexpected calcification. Skull: Normal. Negative for fracture or focal lesion. Sinuses/Orbits: No acute finding. Other: Old nasal bone fractures are noted. IMPRESSION: Changes consistent with remote left cerebellar infarct. Chronic ischemic changes are seen. No acute abnormality is noted. Electronically Signed   By: Alcide Clever M.D.   On: 11/07/2016 13:52   Mr Brain Wo Contrast  Result Date: 11/07/2016 CLINICAL DATA:  Altered mental status. Upper GI bleed with melena, acute hemorrhagic anemia. EXAM: MRI HEAD WITHOUT CONTRAST TECHNIQUE: Multiplanar, multiecho pulse sequences of the brain and surrounding structures were obtained without intravenous contrast. The patient could only complete a portion of the exam. COMPARISON:  CT earlier today.  MR head 03/10/2016. FINDINGS: Brain: Multifocal areas of restricted diffusion, corresponding low ADC, affecting the LEFT greater than RIGHT cerebral hemispheres, predominantly the subcortical and periventricular deep white matter. The pattern of infarction is fairly linear, from front to back, lying between the vascular territories of the anterior/middle and middle/ posterior cerebral arteries. There is some involvement of the LEFT  lentiform nucleus, possibly the LEFT caudate. No brainstem or cerebellar involvement. No definite acute or chronic hemorrhage. Findings are most consistent with acute watershed type infarction. Multifocal emboli are less favored, particularly given the lack of cortical involvement. There is advanced atrophy with chronic microvascular ischemic change. There is an old LEFT cerebellar infarct. There may be an old RIGHT frontal infarct. Vascular: Unable to assess due  to motion. Skull and upper cervical spine: Unable to assess. Sinuses/Orbits: Negative. Other: None. IMPRESSION: Motion degraded exam which is diagnostic but prematurely truncated. Multifocal areas of acute nonhemorrhagic infarction, LEFT greater than RIGHT, primarily affecting the deep white matter, supratentorial in location, which are most consistent with a watershed pattern of injury, possibly due to hypotension or cardiac arrhythmia. See discussion above. Advanced atrophy with chronic microvascular ischemic change. Evidence for remote cerebral infarction. Electronically Signed   By: Elsie Stain M.D.   On: 11/07/2016 21:42   Medications: I have reviewed the patient's current medications. Scheduled Meds: . acetaminophen  650 mg Oral Once  . insulin aspart  0-5 Units Subcutaneous QHS  . insulin aspart  0-9 Units Subcutaneous TID WC  . LORazepam      . [START ON 11/11/2016] pantoprazole  40 mg Intravenous Q12H  . traZODone  150 mg Oral QHS   Continuous Infusions: . pantoprozole (PROTONIX) infusion 8 mg/hr (11/08/16 0526)   PRN Meds:.acetaminophen **OR** acetaminophen, LORazepam   Assessment: Active Problems:   GI bleed  [name] Zacharee Gaddie. is a 56 y.o. African-American male with polysubstance abuse, history of stroke, diabetes presented with epigastric pain, altered mental status and found to have melena and acute anemia. Does not have cirrhosis or chronic liver disease. Differentials include upper GI source such as peptic ulcer disease or erosive esophagitis or AVMs or a small bowel source or less likely right colonic source. MRI revealed acute bilateral ischemic infarcts. Ischemic stroke is likely secondary to acute GI bleed resulting in anemia. No melenic episodes since admission. Hemoglobin is stable.Ferritin 86, B12 below 300, mildly low. Stroke team is following  Plan:  - Endoscopic evaluation in the setting of acute stroke is associated with higher risks compared to benefit when  patient is not actively bleeding - Continue Protonix drip  - Recommend B12 injection - Monitor CBC closely - Ok with GI to start anti-platelet agents if deemed necessary per neurology  We will follow patient peripherally, call on-call GI physician if patient develops recurrence of melena or declining hemoglobin   LOS: 1 day   Larrisha Babineau 11/08/2016, 2:12 PM

## 2016-11-09 ENCOUNTER — Inpatient Hospital Stay: Payer: Medicaid Other

## 2016-11-09 LAB — TYPE AND SCREEN
ABO/RH(D): O NEG
ANTIBODY SCREEN: NEGATIVE
UNIT DIVISION: 0
Unit division: 0

## 2016-11-09 LAB — BPAM RBC
BLOOD PRODUCT EXPIRATION DATE: 201810102359
BLOOD PRODUCT EXPIRATION DATE: 201810122359
ISSUE DATE / TIME: 201810042139
ISSUE DATE / TIME: 201810051232
UNIT TYPE AND RH: 9500
Unit Type and Rh: 9500

## 2016-11-09 LAB — HEMOGLOBIN A1C
Hgb A1c MFr Bld: 5 % (ref 4.8–5.6)
Mean Plasma Glucose: 96.8 mg/dL

## 2016-11-09 LAB — LIPID PANEL
CHOL/HDL RATIO: 3.8 ratio
Cholesterol: 91 mg/dL (ref 0–200)
HDL: 24 mg/dL — AB (ref >40)
LDL CALC: 50 mg/dL (ref 0–99)
TRIGLYCERIDES: 85 mg/dL (ref <150)
VLDL: 17 mg/dL (ref 0–40)

## 2016-11-09 LAB — GLUCOSE, CAPILLARY
GLUCOSE-CAPILLARY: 97 mg/dL (ref 65–99)
Glucose-Capillary: 108 mg/dL — ABNORMAL HIGH (ref 65–99)
Glucose-Capillary: 101 mg/dL — ABNORMAL HIGH (ref 65–99)
Glucose-Capillary: 112 mg/dL — ABNORMAL HIGH (ref 65–99)

## 2016-11-09 LAB — HIV ANTIBODY (ROUTINE TESTING W REFLEX): HIV Screen 4th Generation wRfx: NONREACTIVE

## 2016-11-09 LAB — HEMOGLOBIN
Hemoglobin: 8.9 g/dL — ABNORMAL LOW (ref 13.0–18.0)
Hemoglobin: 9.4 g/dL — ABNORMAL LOW (ref 13.0–18.0)

## 2016-11-09 MED ORDER — LORAZEPAM 2 MG/ML IJ SOLN
1.0000 mg | INTRAMUSCULAR | Status: DC | PRN
  Administered 2016-11-09 – 2016-11-10 (×3): 1 mg via INTRAVENOUS
  Filled 2016-11-09 (×3): qty 1

## 2016-11-09 NOTE — Progress Notes (Signed)
Subjective: Patient remains uncooperative and agitated.    Objective: Current vital signs: BP 115/71 (BP Location: Left Arm)   Pulse 89   Temp 97.9 F (36.6 C)   Resp 20   Ht  (1.778 m)   Wt 102.1 kg (225 lb)   SpO2 100%   BMI 32.28 kg/m  Vital signs in last 24 hours: Temp:  [97.9 F (36.6 C)-100 F (37.8 C)] 97.9 F (36.6 C) (10/06 0509) Pulse Rate:  [89-101] 89 (10/06 0509) Resp:  [16-20] 20 (10/06 0509) BP: (115-141)/(63-92) 115/71 (10/06 0509) SpO2:  [92 %-100 %] 100 % (10/06 0509)  Intake/Output from previous day: 10/05 0701 - 10/06 0700 In: 634.2 [I.V.:254.2; Blood:380] Out: -  Intake/Output this shift: No intake/output data recorded. Nutritional status: Diet NPO time specified  Neurologic Exam: Mental Status: Alert.  Nonverbal.  Does not follow commands. Not cooperative. Cranial Nerves: II: Discs flat bilaterally; Pupils equal, round, reactive to light and accommodation III,IV, VI: ptosis not present, extra-ocular motions intact bilaterally V,VII: possible right facial droop, corneals intact bilaterally VIII: hearing normal bilaterally IX,X: gag reflex unable to be  tested XI: unable to be tested XII: unable to be tested Motor: Moves all extremities    Lab Results: Basic Metabolic Panel:  Recent Labs Lab 11/04/16 1332 11/07/16 1323 11/08/16 0213  NA 137 139 141  K 4.3 4.6 3.5  CL 105 111 115*  CO2 25 19* 21*  GLUCOSE 151* 236* 137*  BUN 31* 36* 31*  CREATININE 1.20 1.27* 1.22  CALCIUM 8.6* 8.3* 8.1*    Liver Function Tests:  Recent Labs Lab 11/04/16 1647 11/07/16 1323  AST 21 18  ALT 20 20  ALKPHOS 54 48  BILITOT 0.6 0.6  PROT 6.3* 6.5  ALBUMIN 3.5 3.6    Recent Labs Lab 11/04/16 1647  LIPASE 18   No results for input(s): AMMONIA in the last 168 hours.  CBC:  Recent Labs Lab 11/04/16 1332 11/07/16 1323 11/07/16 1759 11/08/16 0213 11/08/16 1001 11/08/16 1746 11/09/16 0200 11/09/16 0955  WBC 10.3 13.7*  --   13.3*  --   --   --   --   NEUTROABS  --  10.9*  --   --   --   --   --   --   HGB 13.5 7.5* 7.4* 7.8* 8.3* 9.1* 8.9* 9.4*  HCT 37.7* 22.1* 21.4* 22.1*  --   --   --   --   MCV 89.2 91.1  --  90.8  --   --   --   --   PLT 245 286  --  244  --   --   --   --     Cardiac Enzymes:  Recent Labs Lab 11/04/16 1332 11/04/16 1647 11/07/16 1323  TROPONINI 0.04* 0.04* <0.03    Lipid Panel:  Recent Labs Lab 11/09/16 0200  CHOL 91  TRIG 85  HDL 24*  CHOLHDL 3.8  VLDL 17  LDLCALC 50    CBG:  Recent Labs Lab 11/08/16 1226 11/08/16 1638 11/08/16 2104 11/09/16 0754 11/09/16 1147  GLUCAP 115* 102* 112* 108* 101*    Microbiology: Results for orders placed or performed during the hospital encounter of 11/07/16  MRSA PCR Screening     Status: None   Collection Time: 11/07/16  5:30 PM  Result Value Ref Range Status   MRSA by PCR NEGATIVE NEGATIVE Final    Comment:        The GeneXpert MRSA Assay (FDA  approved for NASAL specimens only), is one component of a comprehensive MRSA colonization surveillance program. It is not intended to diagnose MRSA infection nor to guide or monitor treatment for MRSA infections.     Coagulation Studies:  Recent Labs  11/07/16 1323  LABPROT 15.0  INR 1.19    Imaging: Ct Head Wo Contrast  Result Date: 11/07/2016 CLINICAL DATA:  Facial droop and slurred speech EXAM: CT HEAD WITHOUT CONTRAST TECHNIQUE: Contiguous axial images were obtained from the base of the skull through the vertex without intravenous contrast. COMPARISON:  06/07/2016 FINDINGS: Brain: There are changes consistent with prior left cerebellar infarct. These changes are stable from the prior exam. No acute hemorrhage, acute infarction or space-occupying mass lesion is noted. Scattered chronic white matter ischemic change is noted. Vascular: No hyperdense vessel or unexpected calcification. Skull: Normal. Negative for fracture or focal lesion. Sinuses/Orbits: No acute  finding. Other: Old nasal bone fractures are noted. IMPRESSION: Changes consistent with remote left cerebellar infarct. Chronic ischemic changes are seen. No acute abnormality is noted. Electronically Signed   By: Alcide Clever M.D.   On: 11/07/2016 13:52   Mr Brain Wo Contrast  Result Date: 11/07/2016 CLINICAL DATA:  Altered mental status. Upper GI bleed with melena, acute hemorrhagic anemia. EXAM: MRI HEAD WITHOUT CONTRAST TECHNIQUE: Multiplanar, multiecho pulse sequences of the brain and surrounding structures were obtained without intravenous contrast. The patient could only complete a portion of the exam. COMPARISON:  CT earlier today.  MR head 03/10/2016. FINDINGS: Brain: Multifocal areas of restricted diffusion, corresponding low ADC, affecting the LEFT greater than RIGHT cerebral hemispheres, predominantly the subcortical and periventricular deep white matter. The pattern of infarction is fairly linear, from front to back, lying between the vascular territories of the anterior/middle and middle/ posterior cerebral arteries. There is some involvement of the LEFT lentiform nucleus, possibly the LEFT caudate. No brainstem or cerebellar involvement. No definite acute or chronic hemorrhage. Findings are most consistent with acute watershed type infarction. Multifocal emboli are less favored, particularly given the lack of cortical involvement. There is advanced atrophy with chronic microvascular ischemic change. There is an old LEFT cerebellar infarct. There may be an old RIGHT frontal infarct. Vascular: Unable to assess due to motion. Skull and upper cervical spine: Unable to assess. Sinuses/Orbits: Negative. Other: None. IMPRESSION: Motion degraded exam which is diagnostic but prematurely truncated. Multifocal areas of acute nonhemorrhagic infarction, LEFT greater than RIGHT, primarily affecting the deep white matter, supratentorial in location, which are most consistent with a watershed pattern of injury,  possibly due to hypotension or cardiac arrhythmia. See discussion above. Advanced atrophy with chronic microvascular ischemic change. Evidence for remote cerebral infarction. Electronically Signed   By: Elsie Stain M.D.   On: 11/07/2016 21:42    Medications:  I have reviewed the patient's current medications. Scheduled: . acetaminophen  650 mg Oral Once  . folic acid  1 mg Oral Daily  . insulin aspart  0-5 Units Subcutaneous QHS  . insulin aspart  0-9 Units Subcutaneous TID WC  . multivitamin with minerals  1 tablet Oral Daily  . [START ON 11/11/2016] pantoprazole  40 mg Intravenous Q12H  . thiamine  100 mg Oral Daily   Or  . thiamine  100 mg Intravenous Daily  . traZODone  150 mg Oral QHS    Assessment/Plan: Patient remains altered.  Stroke work up remains pending.  A1c 5.0.  LDL 50.    Will continue to follow with you.  Unable to be on  antiplatelet therapy at this time.     LOS: 2 days   Thana Farr, MD Neurology [phone] 11/09/2016  12:24 PM

## 2016-11-09 NOTE — Progress Notes (Signed)
[facility] -  at [facility]   PATIENT NAME: [name] [name]    MR#:  [id number]  DATE OF BIRTH:  June 01, 1960  SUBJECTIVE:   Patient confused and restless. Nonverbal.  REVIEW OF SYSTEMS:   Review of Systems  Unable to perform ROS: Mental status change   Tolerating Diet:npo Tolerating PT: pending  DRUG ALLERGIES:  No Known Allergies  VITALS:  Blood pressure 115/71, pulse 89, temperature 97.9 F (36.6 C), resp. rate 20, height  (1.778 m), weight 102.1 kg (225 lb), SpO2 100 %.  PHYSICAL EXAMINATION:   Physical Exam  GENERAL:  56 y.o.-year-old patient lying in the bed. sedated EYES: Pupils equal, round, reactive to light and accommodation. No scleral icterus. Extraocular muscles intact.  HEENT: Head atraumatic, normocephalic. Oropharynx and nasopharynx clear.  NECK:  Supple, no jugular venous distention. No thyroid enlargement, no tenderness.  LUNGS: Normal breath sounds bilaterally, no wheezing, rales, rhonchi. No use of accessory muscles of respiration.  CARDIOVASCULAR: S1, S2 normal. No murmurs, rubs, or gallops.  ABDOMEN: Soft, nontender, nondistended. Bowel sounds present. No organomegaly or mass.  EXTREMITIES: No cyanosis, clubbing or edema b/l.    NEUROLOGIC: moves all extremities PSYCHIATRIC:  patient is sedated SKIN: No obvious rash, lesion, or ulcer.   LABORATORY PANEL:  CBC  Recent Labs Lab 11/08/16 0213  11/09/16 0955  WBC 13.3*  --   --   HGB 7.8*  < > 9.4*  HCT 22.1*  --   --   PLT 244  --   --   < > = values in this interval not displayed.  Chemistries   Recent Labs Lab 11/07/16 1323 11/08/16 0213  NA 139 141  K 4.6 3.5  CL 111 115*  CO2 19* 21*  GLUCOSE 236* 137*  BUN 36* 31*  CREATININE 1.27* 1.22  CALCIUM 8.3* 8.1*  AST 18  --   ALT 20  --   ALKPHOS 48  --   BILITOT 0.6  --    Cardiac Enzymes  Recent Labs Lab 11/07/16 1323  TROPONINI <0.03   RADIOLOGY:  Ct Head Wo Contrast  Result Date:  11/07/2016 CLINICAL DATA:  Facial droop and slurred speech EXAM: CT HEAD WITHOUT CONTRAST TECHNIQUE: Contiguous axial images were obtained from the base of the skull through the vertex without intravenous contrast. COMPARISON:  06/07/2016 FINDINGS: Brain: There are changes consistent with prior left cerebellar infarct. These changes are stable from the prior exam. No acute hemorrhage, acute infarction or space-occupying mass lesion is noted. Scattered chronic white matter ischemic change is noted. Vascular: No hyperdense vessel or unexpected calcification. Skull: Normal. Negative for fracture or focal lesion. Sinuses/Orbits: No acute finding. Other: Old nasal bone fractures are noted. IMPRESSION: Changes consistent with remote left cerebellar infarct. Chronic ischemic changes are seen. No acute abnormality is noted. Electronically Signed   By: Alcide Clever M.D.   On: 11/07/2016 13:52   Mr Brain Wo Contrast  Result Date: 11/07/2016 CLINICAL DATA:  Altered mental status. Upper GI bleed with melena, acute hemorrhagic anemia. EXAM: MRI HEAD WITHOUT CONTRAST TECHNIQUE: Multiplanar, multiecho pulse sequences of the brain and surrounding structures were obtained without intravenous contrast. The patient could only complete a portion of the exam. COMPARISON:  CT earlier today.  MR head 03/10/2016. FINDINGS: Brain: Multifocal areas of restricted diffusion, corresponding low ADC, affecting the LEFT greater than RIGHT cerebral hemispheres, predominantly the subcortical and periventricular deep white matter. The pattern of infarction is fairly linear, from front to back,  lying between the vascular territories of the anterior/middle and middle/ posterior cerebral arteries. There is some involvement of the LEFT lentiform nucleus, possibly the LEFT caudate. No brainstem or cerebellar involvement. No definite acute or chronic hemorrhage. Findings are most consistent with acute watershed type infarction. Multifocal emboli are  less favored, particularly given the lack of cortical involvement. There is advanced atrophy with chronic microvascular ischemic change. There is an old LEFT cerebellar infarct. There may be an old RIGHT frontal infarct. Vascular: Unable to assess due to motion. Skull and upper cervical spine: Unable to assess. Sinuses/Orbits: Negative. Other: None. IMPRESSION: Motion degraded exam which is diagnostic but prematurely truncated. Multifocal areas of acute nonhemorrhagic infarction, LEFT greater than RIGHT, primarily affecting the deep white matter, supratentorial in location, which are most consistent with a watershed pattern of injury, possibly due to hypotension or cardiac arrhythmia. See discussion above. Advanced atrophy with chronic microvascular ischemic change. Evidence for remote cerebral infarction. Electronically Signed   By: Elsie Stain M.D.   On: 11/07/2016 21:42   ASSESSMENT AND PLAN:  [name] [name]  is a 56 y.o. [place] for altered [place] status.  Patient's hemoglobin a couple days ago was 13.5 and today at 7.5. Rectal exam showed melena   1.Upper GI bleed with melena. Acute hemorrhagic anemia. Hemoglobin dropped from 13.5 a few days ago to 7.5  - 3 units PRBC transfused this admission and HB is improved. - cont IV Protonix drip. Serial hemoglobins.   -Hold aspirin, Plavix and naproxen. Nothing by mouth for now -EGD on hold due to acute CVA per GI  2. Acute encephalopathy due to acute CVA -MRI with bilateral watershed CVA - Urine toxicology negative.  -echo and carotids pending -Neurology input noted. Discussed with Dr. Thad Ranger -ASA and plavix held due to GI bleed -  statins when able to take po meds  3. Essential hypertension. Hold antihypertensive medications  4. Diabetes mellitus. Sliding scale for now  5. History of GERD on Protonix drip  6. History of coronary artery disease  7. History of bipolar disorder  Discussed with daughter Delaney Meigs  [phone] over the phone and updated.  Patient continues to be acutely ill. High risk for GI bleed with aspirin and Plavix. Will need EGD prior to starting this. Need to discuss regarding goals of care with family.  Case discussed with Care Management/Social Worker.  CODE STATUS: FULL  DVT Prophylaxis: SCD/TEDs (due to GI bleed)  TOTAL TIME TAKING CARE OF THIS PATIENT: 35 minutes.   POSSIBLE D/C IN few DAYS, DEPENDING ON CLINICAL CONDITION.  Note: This dictation was prepared with Dragon dictation along with smaller phrase technology. Any transcriptional errors that result from this process are unintentional.  Milagros Loll R M.D on 11/09/2016 at 12:10 PM  Between 7am to 6pm - Pager - [phone]  After 6pm go to [email or web address] - password [email or web address]  [facility]  Office  [phone]  CC: Primary care physician; Preston Fleeting, MDPatient ID: Jeanice Lim., male   DOB: November 28, 1960, 56 y.o.   MRN: [id number]

## 2016-11-09 NOTE — Plan of Care (Signed)
Problem: Education: Goal: Knowledge of disease or condition will improve Outcome: Not Progressing Patient w/ AMS. Unable to assess full neuro exam d/t not following commands. Purposeful movements noted. Will open eyes to voice and touch. CT scan and ultrasound completed. Waiting on ECHO. Discontinued telemetry d/t unable to keep on leads from restlessness and pulling at lines - MD approved. Family at bedside updated by RN and MD. Occasionally alert and verbal (yes/no) w/ family. NPO. Free of falls/injury.

## 2016-11-10 ENCOUNTER — Inpatient Hospital Stay
Admit: 2016-11-10 | Discharge: 2016-11-10 | Disposition: A | Discharge status: Home / Self Care | Payer: Medicaid Other | Attending: Internal Medicine | Admitting: Internal Medicine

## 2016-11-10 LAB — GLUCOSE, CAPILLARY
GLUCOSE-CAPILLARY: 112 mg/dL — AB (ref 65–99)
GLUCOSE-CAPILLARY: 122 mg/dL — AB (ref 65–99)
GLUCOSE-CAPILLARY: 95 mg/dL (ref 65–99)
Glucose-Capillary: 87 mg/dL (ref 65–99)

## 2016-11-10 LAB — BASIC METABOLIC PANEL
ANION GAP: 10 (ref 5–15)
BUN: 35 mg/dL — AB (ref 6–20)
CHLORIDE: 112 mmol/L — AB (ref 101–111)
CO2: 21 mmol/L — ABNORMAL LOW (ref 22–32)
Calcium: 8.9 mg/dL (ref 8.9–10.3)
Creatinine, Ser: 1.24 mg/dL (ref 0.61–1.24)
GFR calc Af Amer: >60 mL/min (ref >60)
GFR calc non Af Amer: >60 mL/min (ref >60)
Glucose, Bld: 115 mg/dL — ABNORMAL HIGH (ref 65–99)
POTASSIUM: 3.6 mmol/L (ref 3.5–5.1)
Sodium: 143 mmol/L (ref 135–145)

## 2016-11-10 LAB — CBC WITH DIFFERENTIAL/PLATELET
BASOS ABS: 0.1 10*3/uL (ref 0–0.1)
BASOS PCT: 1 %
Eosinophils Absolute: 0.3 10*3/uL (ref 0–0.7)
Eosinophils Relative: 2 %
HEMATOCRIT: 28.8 % — AB (ref 40.0–52.0)
HEMOGLOBIN: 10.2 g/dL — AB (ref 13.0–18.0)
Lymphocytes Relative: 22 %
Lymphs Abs: 2.8 10*3/uL (ref 1.0–3.6)
MCH: 33 pg (ref 26.0–34.0)
MCHC: 35.3 g/dL (ref 32.0–36.0)
MCV: 93.6 fL (ref 80.0–100.0)
Monocytes Absolute: 1.2 10*3/uL — ABNORMAL HIGH (ref 0.2–1.0)
Monocytes Relative: 9 %
NEUTROS ABS: 8.4 10*3/uL — AB (ref 1.4–6.5)
NEUTROS PCT: 66 %
Platelets: 351 10*3/uL (ref 150–440)
RBC: 3.08 MIL/uL — AB (ref 4.40–5.90)
RDW: 14.4 % (ref 11.5–14.5)
WBC: 12.7 10*3/uL — AB (ref 3.8–10.6)

## 2016-11-10 LAB — ECHOCARDIOGRAM COMPLETE
Height: 70 in
WEIGHTICAEL: 3600 [oz_av]

## 2016-11-10 MED ORDER — HALOPERIDOL LACTATE 5 MG/ML IJ SOLN
2.0000 mg | Freq: Once | INTRAMUSCULAR | Status: AC
  Administered 2016-11-10: 21:00:00 2 mg via INTRAVENOUS
  Filled 2016-11-10: qty 1

## 2016-11-10 MED ORDER — PANTOPRAZOLE SODIUM 40 MG PO TBEC: 40.0000 mg | DELAYED_RELEASE_TABLET | Freq: Two times a day (BID) | ORAL | Status: DC

## 2016-11-10 MED ORDER — ONDANSETRON HCL 4 MG/2ML IJ SOLN
4.0000 mg | Freq: Four times a day (QID) | INTRAMUSCULAR | Status: DC | PRN
  Administered 2016-11-10: 20:00:00 4 mg via INTRAVENOUS
  Filled 2016-11-10: qty 2

## 2016-11-10 MED ORDER — PANTOPRAZOLE SODIUM 40 MG IV SOLR
40.0000 mg | Freq: Two times a day (BID) | INTRAVENOUS | Status: DC
  Administered 2016-11-10 – 2016-11-11 (×3): 40 mg via INTRAVENOUS
  Filled 2016-11-10 (×3): qty 40

## 2016-11-10 NOTE — Progress Notes (Signed)
*  PRELIMINARY RESULTS* Echocardiogram 2D Echocardiogram has been performed. Limited Echo performed due to patient's non-compliance.  [name] [name] 11/10/2016, 9:27 AM

## 2016-11-10 NOTE — Clinical Social Work Note (Signed)
CSW received verbal notification from the [facility] that this patient may be here from [facility]. The patient is not from a facility -- his father is currently a resident of [facility]. The patient's niece confirmed that he lives alone and is not in a group home any longer. CSW is signing off.  Argentina Ponder, MSW, Theresia Majors [phone]

## 2016-11-10 NOTE — Progress Notes (Signed)
[facility] [place] [facility]   PATIENT NAME: [name] [name]    MR#:  [id number]  DATE OF BIRTH:  December 17, 1960  SUBJECTIVE:   Patient confused and restless. Nonverbal.  REVIEW OF SYSTEMS:   Review of Systems  Unable to perform ROS: Mental status change   Tolerating Diet:npo Tolerating PT: pending  DRUG ALLERGIES:  No Known Allergies  VITALS:  Blood pressure (!) 153/123, pulse 86, temperature 98.3 F (36.8 C), resp. rate 20, height  (1.778 m), weight 102.1 kg (225 lb), SpO2 100 %.  PHYSICAL EXAMINATION:   Physical Exam  GENERAL:  56 y.o.-year-old patient lying in the bed. sedated EYES: Pupils equal, round, reactive to light and accommodation. No scleral icterus. Extraocular muscles intact.  HEENT: Head atraumatic, normocephalic. Oropharynx and nasopharynx clear.  NECK:  Supple, no jugular venous distention. No thyroid enlargement, no tenderness.  LUNGS: Normal breath sounds bilaterally, no wheezing, rales, rhonchi. No use of accessory muscles of respiration.  CARDIOVASCULAR: S1, S2 normal. No murmurs, rubs, or gallops.  ABDOMEN: Soft, nontender, nondistended. Bowel sounds present. No organomegaly or mass.  EXTREMITIES: No cyanosis, clubbing or edema b/l.    NEUROLOGIC: moves all extremities PSYCHIATRIC:  patient is sedated SKIN: No obvious rash, lesion, or ulcer.   LABORATORY PANEL:  CBC  Recent Labs Lab 11/10/16 0723  WBC 12.7*  HGB 10.2*  HCT 28.8*  PLT 351    Chemistries   Recent Labs Lab 11/07/16 1323  11/10/16 0723  NA 139  < > 143  K 4.6  < > 3.6  CL 111  < > 112*  CO2 19*  < > 21*  GLUCOSE 236*  < > 115*  BUN 36*  < > 35*  CREATININE 1.27*  < > 1.24  CALCIUM 8.3*  < > 8.9  AST 18  --   --   ALT 20  --   --   ALKPHOS 48  --   --   BILITOT 0.6  --   --   < > = values in this interval not displayed. Cardiac Enzymes  Recent Labs Lab 11/07/16 1323  TROPONINI <0.03   RADIOLOGY:  Ct Head Wo  Contrast  Result Date: 11/09/2016 CLINICAL DATA:  Acute presentation for altered mental status with confusion. EXAM: CT HEAD WITHOUT CONTRAST TECHNIQUE: Contiguous axial images were obtained from the base of the skull through the vertex without intravenous contrast. COMPARISON:  CT and MRI 11/07/2016 FINDINGS: Brain: Chronic generalized atrophy. Old left cerebellar infarction. Chronic small-vessel ischemic changes of the pons. Chronic small-vessel ischemic changes of the hemispheric white matter. Old right frontoparietal cortical and subcortical infarction. Small foci of deep brain infarction bilaterally left more than right most consistent with watershed infarction is not specifically discernible on CT against the background of chronic changes. No evidence of hemorrhage or mass effect. No hydrocephalus or extra-axial collection. Vascular: There is atherosclerotic calcification of the major vessels at the base of the brain. 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 The small acute infarctions seen affecting both hemispheres in a watershed pattern left more than right are not discernible on the CT against the background of chronic changes. No evidence of hemorrhage or mass effect. Electronically Signed   By: Paulina Fusi M.D.   On: 11/09/2016 14:32   US Carotid Bilateral  Result Date: 11/09/2016 CLINICAL DATA:  56 year old male with multifocal  bilateral cerebral infarcts. EXAM: BILATERAL CAROTID DUPLEX ULTRASOUND TECHNIQUE: Wallace Cullens scale imaging, color Doppler and duplex ultrasound were performed of bilateral carotid and vertebral arteries in the neck. COMPARISON:  Brain MRI 11/07/2016 ; prior carotid duplex ultrasound 10/19/2014 FINDINGS: Criteria: Quantification of carotid stenosis is based on velocity parameters that correlate the residual internal  carotid diameter with NASCET-based stenosis levels, using the diameter of the distal internal carotid lumen as the denominator for stenosis measurement. The following velocity measurements were obtained: RIGHT ICA:  58/17 cm/sec CCA:  69/10 cm/sec SYSTOLIC ICA/CCA RATIO:  0.8 DIASTOLIC ICA/CCA RATIO:  1.7 ECA:  135 cm/sec LEFT ICA:  54/30 cm/sec CCA:  95/19 cm/sec SYSTOLIC ICA/CCA RATIO:  0.6 DIASTOLIC ICA/CCA RATIO:  1.6 ECA:  87 cm/sec RIGHT CAROTID ARTERY: Trace heterogeneous atherosclerotic plaque. By peak systolic velocity criteria, the estimated stenosis remains less than 15%. RIGHT VERTEBRAL ARTERY:  Patent with normal antegrade flow. LEFT CAROTID ARTERY: Mild smooth heterogeneous atherosclerotic plaque. By peak systolic velocity criteria, the estimated stenosis remains less than 50%. LEFT VERTEBRAL ARTERY:  Patent with normal antegrade flow. IMPRESSION: 1. Mild (1-49%) stenosis proximal right internal carotid artery secondary to trace heterogeneous atherosclerotic plaque without significant interval progression compared to September of 2016. 2. Mild (1-49%) stenosis proximal left internal carotid artery secondary to smooth heterogeneous atherosclerotic plaque without significant interval progression compared to September of 2016. 3. Vertebral arteries are patent with normal antegrade flow. Signed, Sterling Big, MD [facility] Electronically Signed   By: Malachy Moan M.D.   On: 11/09/2016 15:01   ASSESSMENT AND PLAN:  [name] [name]  is a 56 y.o. [place] status.  Patient's hemoglobin a couple days ago was 13.5 and today at 7.5. Rectal exam showed melena   1.Upper GI bleed with melena. Acute hemorrhagic anemia. Hemoglobin dropped from 13.5 a few days ago to 7.5  - 3 units PRBC transfused this admission and HB is improved. - cont IV Protonix -Hold aspirin, Plavix and naproxen. Nothing by mouth  for now -EGD on hold due to acute CVA per GI  2. Acute encephalopathy due to acute CVA -MRI with bilateral watershed CVA - Urine toxicology negative.  -echo and carotids pending -Neurology input noted. Discussed with Dr. Thad Ranger -ASA and plavix held due to GI bleed - statins when able to take po meds  3. Essential hypertension. Hold antihypertensive medications  4. Diabetes mellitus. Sliding scale for now  5. History of GERD on Protonix drip  6. History of coronary artery disease  7. History of bipolar disorder  Case discussed with Care Management/Social Worker.  CODE STATUS: FULL  DVT Prophylaxis: SCD/TEDs (due to GI bleed)  TOTAL TIME TAKING CARE OF THIS PATIENT: 35 minutes.   POSSIBLE D/C IN few DAYS, DEPENDING ON CLINICAL CONDITION.  Note: This dictation was prepared with Dragon dictation along with smaller phrase technology. Any transcriptional errors that result from this process are unintentional.  Milagros Loll R M.D on 11/10/2016 at 11:01 AM  Between 7am to 6pm - Pager - [phone]  After 6pm go to [email or web address] - password [email or web address]  [facility]  Office  [phone]  CC: Primary care physician; Preston Fleeting, MDPatient ID: [name] Lim., male   DOB: September 30, 1960, 56 y.o.   MRN: [id number]

## 2016-11-10 NOTE — Plan of Care (Signed)
Pt's son is not happy with tx his dad is getting here.  Frustrated that he's not eaten since he was admitted.  Explained that he has been very sleepy and is at risk for aspiration.  He has also been NPO for EGD r/t GI Bleed.  He wants to tsfer his dad to another facility.  Offered to call the dr or have the administrative coordinator explain the process of tsfer - When we went in pt's room to do handoff, son was no longer here.

## 2016-11-10 NOTE — Progress Notes (Signed)
PT Cancellation Note  Patient Details Name: [name] [name]. MRN: [id number] DOB: 09-13-1960   Cancelled Treatment:    Reason Eval/Treat Not Completed: Patient's level of consciousness. Patient pulled out 4 IV lines last night, has required a lot of attention from RN staff and is not able to fully participate in PT mobility evaluation secondary to cognitive deficits. Will hold for now per discussion with RN, PT will continue to follow.   Alva Garnet PT, DPT, CSCS    11/10/2016, 11:02 AM

## 2016-11-10 NOTE — Care Management Note (Addendum)
Case Management Note  Patient Details  Name: [name] [name]. MRN: [id number] Date of Birth: 1960-06-19  Subjective/Objective:   Weekend CSW spoke with [facility] and with niece..There is a Database administrator Sr at [facility]. This patient is his son, [name] [name], who resides alone at his own residence per neice.                  Action/Plan:   Expected Discharge Date:                  Expected Discharge Plan:     In-House Referral:     Discharge planning Services     Post Acute Care Choice:    Choice offered to:     DME Arranged:    DME Agency:     HH Arranged:    HH Agency:     Status of Service:     If discussed at [employer] of Stay Meetings, dates discussed:    Additional Comments:  Tyffany Waldrop A, RN 11/10/2016, 3:17 PM

## 2016-11-11 LAB — GLUCOSE, CAPILLARY
GLUCOSE-CAPILLARY: 96 mg/dL (ref 65–99)
GLUCOSE-CAPILLARY: 130 mg/dL — AB (ref 65–99)
Glucose-Capillary: 131 mg/dL — ABNORMAL HIGH (ref 65–99)
Glucose-Capillary: 119 mg/dL — ABNORMAL HIGH (ref 65–99)

## 2016-11-11 MED ORDER — ASPIRIN 81 MG PO CHEW
81.0000 mg | CHEWABLE_TABLET | Freq: Every day | ORAL | Status: DC
  Administered 2016-11-11 – 2016-11-15 (×5): 81 mg via ORAL
  Filled 2016-11-11 (×5): qty 1

## 2016-11-11 MED ORDER — ATORVASTATIN CALCIUM 20 MG PO TABS
40.0000 mg | ORAL_TABLET | Freq: Every day | ORAL | Status: DC
  Administered 2016-11-13 – 2016-11-14 (×2): 40 mg via ORAL
  Filled 2016-11-11 (×2): qty 2

## 2016-11-11 MED ORDER — ORAL CARE MOUTH RINSE
15.0000 mL | Freq: Two times a day (BID) | OROMUCOSAL | Status: DC
  Administered 2016-11-11 – 2016-11-14 (×5): 15 mL via OROMUCOSAL

## 2016-11-11 MED ORDER — LORAZEPAM 2 MG/ML IJ SOLN
0.5000 mg | INTRAMUSCULAR | Status: DC | PRN
  Administered 2016-11-11 – 2016-11-12 (×6): 0.5 mg via INTRAVENOUS
  Filled 2016-11-11 (×6): qty 1

## 2016-11-11 MED ORDER — HALOPERIDOL LACTATE 5 MG/ML IJ SOLN
1.0000 mg | Freq: Once | INTRAMUSCULAR | Status: AC
  Administered 2016-11-11: 1 mg via INTRAVENOUS
  Filled 2016-11-11: qty 1

## 2016-11-11 MED ORDER — PANTOPRAZOLE SODIUM 40 MG PO TBEC
40.0000 mg | DELAYED_RELEASE_TABLET | Freq: Two times a day (BID) | ORAL | Status: DC
  Administered 2016-11-12 – 2016-11-15 (×5): 40 mg via ORAL
  Filled 2016-11-11 (×5): qty 1

## 2016-11-11 NOTE — Plan of Care (Deleted)
Attempted to contact family members of [name] [name] this morning. Unable to reach anyone listed. VM left for [name] [name] to schedule a family meeting, for discussion of GOC including possible PEG tube and code status. Neurology at bedside, [name] [name] appears to be improving per conversation. Cleared to a dysphagia diet. Will continue to follow.

## 2016-11-11 NOTE — Progress Notes (Signed)
Subjective: Patient improved today.  Sitting up in bed, alert.  Has been able to eat with NA feeding him.    Objective: Current vital signs: BP 135/86   Pulse 82   Temp 98.1 F (36.7 C) (Oral)   Resp 20   Ht  (1.778 m)   Wt 102.1 kg (225 lb)   SpO2 99%   BMI 32.28 kg/m  Vital signs in last 24 hours: Temp:  [98.1 F (36.7 C)-98.5 F (36.9 C)] 98.1 F (36.7 C) (10/08 1019) Pulse Rate:  [74-99] 82 (10/08 1019) Resp:  [20] 20 (10/08 0614) BP: (135-157)/(79-86) 135/86 (10/08 1019) SpO2:  [98 %-99 %] 99 % (10/08 1019)  Intake/Output from previous day: No intake/output data recorded. Intake/Output this shift: No intake/output data recorded. Nutritional status: DIET DYS 2 Room service appropriate? Yes; Fluid consistency: Nectar Thick  Neurologic Exam: Mental Status: Alert. Follows simple commands.  Attempts at speech markedly dysarthric.  Cooperative. Cranial Nerves: II: Discs flat bilaterally; Pupils equal, round, reactive to light and accommodation.  Does not blink to confrontation from the right III,IV, VI: ptosis not present, extra-ocular motions intact bilaterally V,VII: right facial droop VIII: hearing normal bilaterally IX,X: gag reflex unable to be tested XI: bilateral shoulder shrug XII: unable to be tested Motor: Moves all extremities weakly  Lab Results: Basic Metabolic Panel:  Recent Labs Lab 11/04/16 1332 11/07/16 1323 11/08/16 0213 11/10/16 0723  NA 137 139 141 143  K 4.3 4.6 3.5 3.6  CL 105 111 115* 112*  CO2 25 19* 21* 21*  GLUCOSE 151* 236* 137* 115*  BUN 31* 36* 31* 35*  CREATININE 1.20 1.27* 1.22 1.24  CALCIUM 8.6* 8.3* 8.1* 8.9    Liver Function Tests:  Recent Labs Lab 11/04/16 1647 11/07/16 1323  AST 21 18  ALT 20 20  ALKPHOS 54 48  BILITOT 0.6 0.6  PROT 6.3* 6.5  ALBUMIN 3.5 3.6    Recent Labs Lab 11/04/16 1647  LIPASE 18   No results for input(s): AMMONIA in the last 168 hours.  CBC:  Recent Labs Lab  11/04/16 1332 11/07/16 1323 11/07/16 1759 11/08/16 0213 11/08/16 1001 11/08/16 1746 11/09/16 0200 11/09/16 0955 11/10/16 0723  WBC 10.3 13.7*  --  13.3*  --   --   --   --  12.7*  NEUTROABS  --  10.9*  --   --   --   --   --   --  8.4*  HGB 13.5 7.5* 7.4* 7.8* 8.3* 9.1* 8.9* 9.4* 10.2*  HCT 37.7* 22.1* 21.4* 22.1*  --   --   --   --  28.8*  MCV 89.2 91.1  --  90.8  --   --   --   --  93.6  PLT 245 286  --  244  --   --   --   --  351    Cardiac Enzymes:  Recent Labs Lab 11/04/16 1332 11/04/16 1647 11/07/16 1323  TROPONINI 0.04* 0.04* <0.03    Lipid Panel:  Recent Labs Lab 11/09/16 0200  CHOL 91  TRIG 85  HDL 24*  CHOLHDL 3.8  VLDL 17  LDLCALC 50    CBG:  Recent Labs Lab 11/10/16 0739 11/10/16 1145 11/10/16 1743 11/10/16 2257 11/11/16 0727  GLUCAP 112* 122* 87 95 96    Microbiology: Results for orders placed or performed during the hospital encounter of 11/07/16  MRSA PCR Screening     Status: None   Collection Time: 11/07/16  5:30 PM  Result Value Ref Range Status   MRSA by PCR NEGATIVE NEGATIVE Final    Comment:        The GeneXpert MRSA Assay (FDA approved for NASAL specimens only), is one component of a comprehensive MRSA colonization surveillance program. It is not intended to diagnose MRSA infection nor to guide or monitor treatment for MRSA infections.     Coagulation Studies: No results for input(s): LABPROT, INR in the last 72 hours.  Imaging: Ct Head Wo Contrast  Result Date: 11/09/2016 CLINICAL DATA:  Acute presentation for altered mental status with confusion. EXAM: CT HEAD WITHOUT CONTRAST TECHNIQUE: Contiguous axial images were obtained from the base of the skull through the vertex without intravenous contrast. COMPARISON:  CT and MRI 11/07/2016 FINDINGS: Brain: Chronic generalized atrophy. Old left cerebellar infarction. Chronic small-vessel ischemic changes of the pons. Chronic small-vessel ischemic changes of the  hemispheric white matter. Old right frontoparietal cortical and subcortical infarction. Small foci of deep brain infarction bilaterally left more than right most consistent with watershed infarction is not specifically discernible on CT against the background of chronic changes. No evidence of hemorrhage or mass effect. No hydrocephalus or extra-axial collection. Vascular: There is atherosclerotic calcification of the major vessels at the base of the brain. 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 The small acute infarctions seen affecting both hemispheres in a watershed pattern left more than right are not discernible on the CT against the background of chronic changes. No evidence of hemorrhage or mass effect. Electronically Signed   By: Paulina Fusi M.D.   On: 11/09/2016 14:32   US Carotid Bilateral  Result Date: 11/09/2016 CLINICAL DATA:  56 year old male with multifocal bilateral cerebral infarcts. EXAM: BILATERAL CAROTID DUPLEX ULTRASOUND TECHNIQUE: Wallace Cullens scale imaging, color Doppler and duplex ultrasound were performed of bilateral carotid and vertebral arteries in the neck. COMPARISON:  Brain MRI 11/07/2016 ; prior carotid duplex ultrasound 10/19/2014 FINDINGS: Criteria: Quantification of carotid stenosis is based on velocity parameters that correlate the residual internal carotid diameter with NASCET-based stenosis levels, using the diameter of the distal internal carotid lumen as the denominator for stenosis measurement. The following velocity measurements were obtained: RIGHT ICA:  58/17 cm/sec CCA:  69/10 cm/sec SYSTOLIC ICA/CCA RATIO:  0.8 DIASTOLIC ICA/CCA RATIO:  1.7 ECA:  135 cm/sec LEFT ICA:  54/30 cm/sec CCA:  95/19 cm/sec SYSTOLIC ICA/CCA RATIO:  0.6 DIASTOLIC ICA/CCA RATIO:  1.6 ECA:  87 cm/sec RIGHT CAROTID ARTERY: Trace  heterogeneous atherosclerotic plaque. By peak systolic velocity criteria, the estimated stenosis remains less than 15%. RIGHT VERTEBRAL ARTERY:  Patent with normal antegrade flow. LEFT CAROTID ARTERY: Mild smooth heterogeneous atherosclerotic plaque. By peak systolic velocity criteria, the estimated stenosis remains less than 50%. LEFT VERTEBRAL ARTERY:  Patent with normal antegrade flow. IMPRESSION: 1. Mild (1-49%) stenosis proximal right internal carotid artery secondary to trace heterogeneous atherosclerotic plaque without significant interval progression compared to September of 2016. 2. Mild (1-49%) stenosis proximal left internal carotid artery secondary to smooth heterogeneous atherosclerotic plaque without significant interval progression compared to September of 2016. 3. Vertebral arteries are patent with normal antegrade flow. Signed, Sterling Big, MD [facility] Electronically Signed   By: Malachy Moan M.D.   On: 11/09/2016 15:01    Medications:  I have reviewed the patient's current medications. Scheduled: . acetaminophen  650 mg Oral Once  .  atorvastatin  40 mg Oral q1800  . folic acid  1 mg Oral Daily  . insulin aspart  0-5 Units Subcutaneous QHS  . insulin aspart  0-9 Units Subcutaneous TID WC  . mouth rinse  15 mL Mouth Rinse BID  . multivitamin with minerals  1 tablet Oral Daily  . pantoprazole (PROTONIX) IV  40 mg Intravenous Q12H  . thiamine  100 mg Oral Daily   Or  . thiamine  100 mg Intravenous Daily  . traZODone  150 mg Oral QHS    Assessment/Plan: Patient improving and more cooperative.    Recommendations: 1.  Would initiate therapy services at this time.   2.  Once medically appropriate would consider antiplatelet therapy.  Patient has been restarted on statin therapy.     LOS: 4 days   Thana Farr, MD Neurology [phone] 11/11/2016  10:59 AM

## 2016-11-11 NOTE — Consult Note (Signed)
Consultation Note Date: 11/11/2016   Patient Name: [name] [name].  DOB: 10-22-1960  MRN: [id number]  Age / Sex: 56 y.o., male  PCP: Revelo, Presley Raddle, MD Referring Physician: Milagros Loll, MD  Reason for Consultation: Establishing goals of care  HPI/Patient Profile:  [name] [name]  is a 56 y.o. [place] for altered mental status. He was evaluated by GI for GIB secondary to melena and drop in HGB. Watchful waiting. Neurology consulted for acute bilateral strokes.      Clinical Assessment and Goals of Care: [name] [name] is following commands today and has been cleared to a dysphagia diet. Nonverbal. Neurology present in room, and per conversation, [name] [name]  appears to be showing improvement today.  It appears he ate some of his breakfast with assistance this morning.   Spoke with son Alesia Banda, he states he will have to speak with his family as he is unsure if his father would want a PEG if it were indicated. Upon discussing code status, he states his father would like a chance, but would not want to be put on life support.  Meeting set up for 12:00 tomorrow afternoon for further discussion.   Attempted to speak with Delaney Meigs, however, a gentleman called back stating he had been receiving calls all weekend, and then the call today asking for Delaney Meigs. He states it is a wrong number.    Adult children are decision makers.     SUMMARY OF RECOMMENDATIONS   Continue current level of care. Family meeting tomorrow at 12:00.     Code Status/Advance Care Planning:  Full code   Additional Recommendations (Limitations, Scope, Preferences):  Full Scope Treatment  Prognosis:  Unable to determine  This depends on his progress from his brain injury .  Discharge Planning: To Be Determined      Primary Diagnoses: Present on Admission: . GI bleed   I have reviewed the medical  record, interviewed the patient and family, and examined the patient. The following aspects are pertinent.  Past Medical History:  Diagnosis Date  . Aortic stenosis, mild February 2015  . Auditory hallucinations   . Bipolar disorder (HCC)   . Coronary artery disease    No reported catheterization. Negative Myoview stress test and 2013.  Marland Kitchen Depression   . Diabetes mellitus   . GERD (gastroesophageal reflux disease)   . History of suicide attempt   . Hx of medication noncompliance   . Hypertension   . Hypertensive hypertrophic cardiomyopathy [facility]) February 2015   Severe concentric LVH on echocardiogram.  . Obesity, Class II, BMI 35-39.9, with comorbidity   . Pericarditis   . Polysubstance abuse (HCC)    cocaine, marijuana  . Stroke [facility]) February 2015   Right MCA aneurysm; head MRI - acute infarct of the right frontal lobe. Smaller acute infarct of the left frontal lobe. Thought to be due to cerebral emboli. Left PICA infarct. Small chronic lacunar infarct right internal capsule. Chronic microvascular ischemic change with white matter change.  Marland Kitchen  Syncope and collapse    Social History   Social History  . Marital status: Single    Spouse name: N/A  . Number of children: N/A  . Years of education: N/A   Social History Main Topics  . Smoking status: Former Smoker    Packs/day: 0.50    Years: 40.00    Types: Cigars  . Smokeless tobacco: Never Used  . Alcohol use No     Comment: occasionally; reports 1 pint of vodka per month at paydaysocially  . Drug use: Yes    Frequency: 1.0 time per week    Types: Marijuana, Cocaine     Comment: past use; denies current; UDS positive for cocaine  . Sexual activity: Yes   Other Topics Concern  . None   Social History Narrative   Single with no children.  No exercise.   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   Has a history of polysubstance abuse including cocaine. No she she urine toxicology  screen in the hospital was positive.  He also current smoker.   He is seen by behavioral health for bipolar disorder.   Family History  Problem Relation Age of Onset  . Stroke Mother   . Diabetes type II Mother   . Hypertension Mother   . Hypertension Father   . Arthritis Father   . Arthritis Sister   . Diabetes type II Sister   . Hypertension Sister    Scheduled Meds: . acetaminophen  650 mg Oral Once  . atorvastatin  40 mg Oral q1800  . folic acid  1 mg Oral Daily  . insulin aspart  0-5 Units Subcutaneous QHS  . insulin aspart  0-9 Units Subcutaneous TID WC  . mouth rinse  15 mL Mouth Rinse BID  . multivitamin with minerals  1 tablet Oral Daily  . pantoprazole (PROTONIX) IV  40 mg Intravenous Q12H  . thiamine  100 mg Oral Daily   Or  . thiamine  100 mg Intravenous Daily  . traZODone  150 mg Oral QHS   Continuous Infusions: PRN Meds:.acetaminophen **OR** acetaminophen, LORazepam, ondansetron (ZOFRAN) IV Medications Prior to Admission:  Prior to Admission medications   Medication Sig Start Date End Date Taking? Authorizing Provider  amLODipine (NORVASC) 10 MG tablet Take 1 tablet (10 mg total) by mouth daily. 04/17/16  Yes Jimmy Footman, MD  aspirin 81 MG EC tablet Take 1 tablet (81 mg total) by mouth daily. 04/17/16  Yes Jimmy Footman, MD  atorvastatin (LIPITOR) 20 MG tablet Take 1 tablet (20 mg total) by mouth daily at 6 PM. 04/17/16  Yes Jimmy Footman, MD  chlorthalidone (HYGROTON) 25 MG tablet Take 1 tablet (25 mg total) by mouth daily. 04/17/16  Yes Jimmy Footman, MD  clopidogrel (PLAVIX) 75 MG tablet Take 1 tablet (75 mg total) by mouth daily. 04/17/16  Yes Jimmy Footman, MD  insulin aspart (NOVOLOG) 100 UNIT/ML FlexPen Inject 10 Units into the skin 3 (three) times daily with meals. 07/26/13  Yes [provider]  metFORMIN (GLUCOPHAGE) 500 MG tablet Take 1 tablet (500 mg total) by mouth 2 (two) times  daily with a meal. 04/17/16  Yes Jimmy Footman, MD  naproxen (NAPROSYN) 500 MG tablet Take 1 tablet (500 mg total) by mouth 2 (two) times daily with a meal. 09/20/16  Yes Jene Every, MD  nitroGLYCERIN (NITROSTAT) 0.4 MG SL tablet Place 1 tablet (0.4 mg total) under the tongue every 5 (five) minutes as  needed for chest pain. 04/17/16  Yes Jimmy Footman, MD  traZODone (DESYREL) 150 MG tablet Take 1 tablet (150 mg total) by mouth at bedtime. 04/17/16  Yes Jimmy Footman, MD   No Known Allergies Review of Systems  Unable to perform ROS   Physical Exam  Constitutional: He appears well-developed and well-nourished.  HENT:  Head: Normocephalic.  Cardiovascular:  Warm and dry  Pulmonary/Chest: Effort normal.  Abdominal: Soft.  Neurological:  Following commands, non verbal.     Vital Signs: BP 135/86   Pulse 82   Temp 98.1 F (36.7 C) (Oral)   Resp 20   Ht  (1.778 m)   Wt 102.1 kg (225 lb)   SpO2 99%   BMI 32.28 kg/m  Pain Assessment: PAINAD   Pain Score: 0-No pain   SpO2: SpO2: 99 % O2 Device:SpO2: 99 % O2 Flow Rate: .O2 Flow Rate (L/min): 0 L/min  IO: Intake/output summary:  Intake/Output Summary (Last 24 hours) at 11/11/16 1103 Last data filed at 11/10/16 2000  Gross per 24 hour  Intake                0 ml  Output                0 ml  Net                0 ml    LBM:   Baseline Weight: Weight: 102.1 kg (225 lb) Most recent weight: Weight: 102.1 kg (225 lb)     Palliative Assessment/Data: 30%     Time In: 10:30 Time Out: 11:30 Time Total:1 hour Greater than 50%  of this time was spent counseling and coordinating care related to the above assessment and plan.  Morton Stall, NP 11/11/2016 2:11 PM Office: [phone] 7am-7pm  Pager: [phone] Call primary team after hours   Please contact Palliative Medicine Team phone at [phone] for questions and concerns.  For individual provider: See  Loretha Stapler

## 2016-11-11 NOTE — Evaluation (Signed)
Physical Therapy Evaluation Patient Details Name: [name] [name]. MRN: [id number] DOB: 1960/10/03 Today's Date: 11/11/2016   History of Present Illness  [name] [name]  is a 56 y.o. male brought in for altered mental status. Patient's hemoglobin a couple days ago was 13.5 and today at 7.5. Rectal exam showed melena and was of course guaiac positive. Urine toxicology was negative. He is now admitted for upper GIB and acute encephalopathy secondary to CVA. Pt is unable to provide any information today so all history obtained from medical record. Attempted to contact 2 separate family members on the telephone without any answer.   Clinical Impression  Pt admitted with above diagnosis. Pt currently with functional limitations due to the deficits listed below (see PT Problem List).  Pt is unable to provide any information today so all of history is obtained from medical record. Attempted to contact 2 separate family members on the phone without an answer. Pt is modified independent for bed mobility with HOB elevated and use of bed rails. Increased time but able to complete without external assistance. Poor LE strength requiring modA+2 for sit to stand transfers. Poor standing balance requiring constant min to modA+1 to stabilize in standing. Attempted standing marches but pt falls backwards. He is unable to ambulate at this time with cues and constantly falls back onto bed. Pt is unsafe to return home at this time and will need SNF placement at discharge. Pt will benefit from PT services to address deficits in strength, balance, and mobility in order to return to full function at home.     Follow Up Recommendations SNF    Equipment Recommendations  Other (comment) (TBD once more history can be obtained)    Recommendations for Other Services OT consult     Precautions / Restrictions Precautions Precautions: Fall Restrictions Weight Bearing Restrictions: No      Mobility  Bed Mobility Overal  bed mobility: Modified Independent             General bed mobility comments: HOB elevated and use of bed rails. Increased time but able to complete without external assistance  Transfers Overall transfer level: Needs assistance Equipment used: Rolling walker (2 wheeled) Transfers: Sit to/from Stand Sit to Stand: Mod assist;+2 physical assistance         General transfer comment: Pt with poor LE strength requiring external assist. Poor standing balance requiring constant min to modA+1 to stabilize in standing. Attempted standing marches but pt falls backwards. He is unable to ambulate at this time with cues and constantly falls back onto bed  Ambulation/Gait             General Gait Details: Unable at this time  Stairs            Wheelchair Mobility    Modified Rankin (Stroke Patients Only)       Balance Overall balance assessment: Needs assistance Sitting-balance support: No upper extremity supported Sitting balance-Leahy Scale: Fair     Standing balance support: Bilateral upper extremity supported Standing balance-Leahy Scale: Poor Standing balance comment: min to modA+1 to remain standing                             Pertinent Vitals/Pain Pain Assessment: Faces Faces Pain Scale: No hurt    Home Living Family/patient expects to be discharged to:: Unsure Living Arrangements: Alone   Type of Home: Apartment Home Access: Level entry     Home Layout: One  level Home Equipment: Cane - single point Additional Comments: Information obtained from medical record    Prior Function           Comments: Pt unable to provide and no current information in record. Family not available via telephone.      Hand Dominance        Extremity/Trunk Assessment   Upper Extremity Assessment Upper Extremity Assessment: Generalized weakness RUE Deficits / Details: Bilateral UE weakness with shoulder flexion, elbow flexion/extension, and grip  strength. Inconsistent command follow    Lower Extremity Assessment Lower Extremity Assessment: Generalized weakness RLE Deficits / Details: Inconsistent command follow. Functional weakness noted with transfers.       Communication   Communication: Expressive difficulties;Other (comment) (Mumbles during PT evaluation)  Cognition Arousal/Alertness: Lethargic Behavior During Therapy: Flat affect Overall Cognitive Status: No family/caregiver present to determine baseline cognitive functioning                                 General Comments: AOx0, mumbles. Unable to answer questions. Head nods are incorrect      General Comments      Exercises     Assessment/Plan    PT Assessment Patient needs continued PT services  PT Problem List Decreased strength;Decreased activity tolerance;Decreased balance;Decreased mobility;Decreased cognition       PT Treatment Interventions DME instruction;Stair training;Gait training;Functional mobility training;Therapeutic activities;Therapeutic exercise;Balance training;Neuromuscular re-education;Cognitive remediation;Wheelchair mobility training;Patient/family education    PT Goals (Current goals can be found in the Care Plan section)  Acute Rehab PT Goals PT Goal Formulation: Patient unable to participate in goal setting    Frequency 7X/week   Barriers to discharge Decreased caregiver support Unclear how much caregiver support he has at home. Chart indicates that he lives alone    Co-evaluation               AM-PAC PT "6 Clicks" Daily Activity  Outcome Measure Difficulty turning over in bed (including adjusting bedclothes, sheets and blankets)?: None Difficulty moving from lying on back to sitting on the side of the bed? : A Little Difficulty sitting down on and standing up from a chair with arms (e.g., wheelchair, bedside commode, etc,.)?: Unable Help needed moving to and from a bed to chair (including a  wheelchair)?: Total Help needed walking in hospital room?: Total Help needed climbing 3-5 steps with a railing? : Total 6 Click Score: 11    End of Session Equipment Utilized During Treatment: Gait belt Activity Tolerance: Patient tolerated treatment well Patient left: in bed;with call bell/phone within reach;with bed alarm set   PT Visit Diagnosis: Unsteadiness on feet (R26.81);Muscle weakness (generalized) (M62.81);Difficulty in walking, not elsewhere classified (R26.2)    Time: 0902-0919 PT Time Calculation (min) (ACUTE ONLY): 17 min   Charges:   PT Evaluation $PT Eval Low Complexity: 1 Low     PT G Codes:   PT G-Codes **NOT FOR INPATIENT CLASS** Functional Assessment Tool Used: AM-PAC 6 Clicks Basic Mobility Functional Limitation: Mobility: Walking and moving around Mobility: Walking and Moving Around Current Status (U9811): At least 60 percent but less than 80 percent impaired, limited or restricted Mobility: Walking and Moving Around Goal Status [phone]): At least 20 percent but less than 40 percent impaired, limited or restricted    Lynnea Maizes PT, DPT    Chapman Matteucci 11/11/2016, 11:13 AM

## 2016-11-11 NOTE — Progress Notes (Addendum)
[facility] [place] [facility]   PATIENT NAME: [name] [name]    MR#:  [id number]  DATE OF BIRTH:  04-11-1960  SUBJECTIVE:   Patient is more awake and following instructions today. Therapy has seen the patient and he is on a dysphagia 2 diet with nectar thickened liquids. Afebrile.  Still agitated at times needing Ativan.  REVIEW OF SYSTEMS:   Review of Systems  Unable to perform ROS: Mental status change   DRUG ALLERGIES:  No Known Allergies  VITALS:  Blood pressure 135/86, pulse 82, temperature 98.1 F (36.7 C), temperature source Oral, resp. rate 20, height  (1.778 m), weight 102.1 kg (225 lb), SpO2 99 %.  PHYSICAL EXAMINATION:   Physical Exam  GENERAL:  56 y.o.-year-old patient lying in the bed. sedated EYES: Pupils equal, round, reactive to light and accommodation. No scleral icterus. Extraocular muscles intact.  HEENT: Head atraumatic, normocephalic. Oropharynx and nasopharynx clear.  NECK:  Supple, no jugular venous distention. No thyroid enlargement, no tenderness.  LUNGS: Normal breath sounds bilaterally, no wheezing, rales, rhonchi. No use of accessory muscles of respiration.  CARDIOVASCULAR: S1, S2 normal. No murmurs, rubs, or gallops.  ABDOMEN: Soft, nontender, nondistended. Bowel sounds present. No organomegaly or mass.  EXTREMITIES: No cyanosis, clubbing or edema b/l.    NEUROLOGIC: moves all extremities and following instructions PSYCHIATRIC:  patient is sedated SKIN: No obvious rash, lesion, or ulcer.   LABORATORY PANEL:  CBC  Recent Labs Lab 11/10/16 0723  WBC 12.7*  HGB 10.2*  HCT 28.8*  PLT 351    Chemistries   Recent Labs Lab 11/07/16 1323  11/10/16 0723  NA 139  < > 143  K 4.6  < > 3.6  CL 111  < > 112*  CO2 19*  < > 21*  GLUCOSE 236*  < > 115*  BUN 36*  < > 35*  CREATININE 1.27*  < > 1.24  CALCIUM 8.3*  < > 8.9  AST 18  --   --   ALT 20  --   --   ALKPHOS 48  --   --   BILITOT 0.6  --   --    < > = values in this interval not displayed. Cardiac Enzymes  Recent Labs Lab 11/07/16 1323  TROPONINI <0.03   RADIOLOGY:  Ct Head Wo Contrast  Result Date: 11/09/2016 CLINICAL DATA:  Acute presentation for altered mental status with confusion. EXAM: CT HEAD WITHOUT CONTRAST TECHNIQUE: Contiguous axial images were obtained from the base of the skull through the vertex without intravenous contrast. COMPARISON:  CT and MRI 11/07/2016 FINDINGS: Brain: Chronic generalized atrophy. Old left cerebellar infarction. Chronic small-vessel ischemic changes of the pons. Chronic small-vessel ischemic changes of the hemispheric white matter. Old right frontoparietal cortical and subcortical infarction. Small foci of deep brain infarction bilaterally left more than right most consistent with watershed infarction is not specifically discernible on CT against the background of chronic changes. No evidence of hemorrhage or mass effect. No hydrocephalus or extra-axial collection. Vascular: There is atherosclerotic calcification of the major vessels at the base of the brain. 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 The small acute infarctions seen affecting both hemispheres in a watershed pattern left more than right are not discernible on the CT against the background of chronic changes. No evidence of hemorrhage or mass effect. Electronically Signed  By: Paulina Fusi M.D.   On: 11/09/2016 14:32   US Carotid Bilateral  Result Date: 11/09/2016 CLINICAL DATA:  56 year old male with multifocal bilateral cerebral infarcts. EXAM: BILATERAL CAROTID DUPLEX ULTRASOUND TECHNIQUE: Wallace Cullens scale imaging, color Doppler and duplex ultrasound were performed of bilateral carotid and vertebral arteries in the neck. COMPARISON:  Brain MRI 11/07/2016 ; prior carotid duplex  ultrasound 10/19/2014 FINDINGS: Criteria: Quantification of carotid stenosis is based on velocity parameters that correlate the residual internal carotid diameter with NASCET-based stenosis levels, using the diameter of the distal internal carotid lumen as the denominator for stenosis measurement. The following velocity measurements were obtained: RIGHT ICA:  58/17 cm/sec CCA:  69/10 cm/sec SYSTOLIC ICA/CCA RATIO:  0.8 DIASTOLIC ICA/CCA RATIO:  1.7 ECA:  135 cm/sec LEFT ICA:  54/30 cm/sec CCA:  95/19 cm/sec SYSTOLIC ICA/CCA RATIO:  0.6 DIASTOLIC ICA/CCA RATIO:  1.6 ECA:  87 cm/sec RIGHT CAROTID ARTERY: Trace heterogeneous atherosclerotic plaque. By peak systolic velocity criteria, the estimated stenosis remains less than 15%. RIGHT VERTEBRAL ARTERY:  Patent with normal antegrade flow. LEFT CAROTID ARTERY: Mild smooth heterogeneous atherosclerotic plaque. By peak systolic velocity criteria, the estimated stenosis remains less than 50%. LEFT VERTEBRAL ARTERY:  Patent with normal antegrade flow. IMPRESSION: 1. Mild (1-49%) stenosis proximal right internal carotid artery secondary to trace heterogeneous atherosclerotic plaque without significant interval progression compared to September of 2016. 2. Mild (1-49%) stenosis proximal left internal carotid artery secondary to smooth heterogeneous atherosclerotic plaque without significant interval progression compared to September of 2016. 3. Vertebral arteries are patent with normal antegrade flow. Signed, Sterling Big, MD [facility] Electronically Signed   By: Malachy Moan M.D.   On: 11/09/2016 15:01   ASSESSMENT AND PLAN:  [name] [name]  is a 56 y.o. male [place] for altered [place] status.  Patient's hemoglobin a couple days ago was 13.5 and today at 7.5. Rectal exam showed melena   * Upper GI bleed with melena. Acute hemorrhagic anemia. Hemoglobin dropped from 13.5 a few days ago  to 7.5  - 3 units PRBC transfused this admission and HB is improved. Now stable - change to PO Protonix - Hold Plavix and naproxen.  - EGD on hold due to acute CVA per GI _ started on ASA for CVA. Monitor for bleeding  * Acute encephalopathy due to acute CVA - MRI with bilateral watershed CVA - Urine toxicology negative.  - Echo and carotids - Nothing acute - Neurology input noted. Discussed with Dr. Thad Ranger - ASA 81 mg start today - statins when able to take po meds  * Dysphagia due to acute CVA. Seen by speech therapy. Now on dysphagia 2 diet with nectar thickened liquids.  * Essential hypertension. Hold antihypertensive medications  * Diabetes mellitus. Sliding scale for now  * History of GERD on Protonix drip  * History of coronary artery disease  *  History of bipolar disorder  Consultation palliative care. At this time will need family meeting for goals of care. Discharge to skilled nursing facility likely in 1-2 days.  Case discussed with Care Management/Social Worker.  CODE STATUS: FULL  DVT Prophylaxis: SCD/TEDs (due to GI bleed)  TOTAL TIME TAKING CARE OF THIS PATIENT: 35 minutes.   POSSIBLE D/C IN few DAYS, DEPENDING ON CLINICAL CONDITION.  Note: This dictation was prepared with Dragon dictation along with smaller phrase technology. Any transcriptional errors that result from this process are unintentional.  Erland Vivas, [employer].D  on 11/11/2016 at 10:49 AM  Between 7am to 6pm - Pager - [phone]  After 6pm go to [email or web address] - password [email or web address]  [facility]  Office  [phone]  CC: Primary care physician; Preston Fleeting, MDPatient ID: [name] Lim., male   DOB: 1960-10-07, 56 y.o.   MRN: [id number]

## 2016-11-11 NOTE — Evaluation (Signed)
Clinical/Bedside Swallow and Speech/Language Evaluation Patient Details  Name: [name] [name]. MRN: [id number] Date of Birth: [birth year]  Today's Date: 11/11/2016 Time: SLP Start Time (ACUTE ONLY): 0800 SLP Stop Time (ACUTE ONLY): 0830 SLP Time Calculation (min) (ACUTE ONLY): 30 min  Past Medical History:  Past Medical History:  Diagnosis Date  . Aortic stenosis, mild February 2015  . Auditory hallucinations   . Bipolar disorder (HCC)   . Coronary artery disease    No reported catheterization. Negative Myoview stress test and 2013.  Marland Kitchen Depression   . Diabetes mellitus   . GERD (gastroesophageal reflux disease)   . History of suicide attempt   . Hx of medication noncompliance   . Hypertension   . Hypertensive hypertrophic cardiomyopathy [facility]) February 2015   Severe concentric LVH on echocardiogram.  . Obesity, Class II, BMI 35-39.9, with comorbidity   . Pericarditis   . Polysubstance abuse (HCC)    cocaine, marijuana  . Stroke [facility]) February 2015   Right MCA aneurysm; head MRI - acute infarct of the right frontal lobe. Smaller acute infarct of the left frontal lobe. Thought to be due to cerebral emboli. Left PICA infarct. Small chronic lacunar infarct right internal capsule. Chronic microvascular ischemic change with white matter change.  . Syncope and collapse    Past Surgical History:  Past Surgical History:  Procedure Laterality Date  . Carotid Dopplers:  03/23/2013   Interval thickening of bilateral common carotid no significant plaque formation. Tortuosity but no significant stenosis. Patent bilateral vertebral  arteries  . NM MYOVIEW LTD  July 2013   No ischemia or infarct. There is inferior wall defec suggestive of diaphragmatic attenuation. Mild LV dilation at to be due to hypertensive heart disease. EF 55%. Poor exercise tolerance.  Marland Kitchen PERICARDIAL FLUID DRAINAGE    . TRANSTHORACIC ECHOCARDIOGRAM  03/23/2013   EF 5560%. Elevated left atrial pressure. Severe concentric  LVH. Mild aortic stenosis. -- Appearance of hypertensive hypertrophic heart disease.  No clear cardioembolic source   HPI:  Pt is a 56 y.o male brought in from [place] for altered mental status. Unable to get any good history from the patient. He does answer a few yes or no questions.  Pt found to have Upper GI bleed with melena and Acute encephalopathy due to acute CVA-MRI with bilateral watershed CVA. PMH includes HTN, DBM, GERD, CAD, and bipolar disorder.  Per RN note Pt's son is not happy with tx his dad is getting here.  Frustrated that he's not eaten since he was admitted.  RN Explained that he has been very sleepy and is at risk for aspiration.  He has also been NPO for EGD r/t GI Bleed. Pt is currently NPO awaiting clinical swallow evaluation. Pt unable to provide hx given aphasia and suspected cognitive-linguistic deficits.  He was resting in bed when approached.  He was easily aroused via verbal and tactile cues.   Assessment / Plan / Recommendation Clinical Impression  Swallow: Pt is a 56 y.o. 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 Oral motor exam revealed significantly reduced strength and range of motion bilaterally, however R>L. 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 Pt presented with throat clear with trials of thin liquid. No overt s/sx of aspiration observed with nectar thick liquid and puree. No change in voice quality observed. Pt (+) prolonged mastication and A-P transport of all solids. Pt would benefit from full supervision and assistance with meals, small bites and  sips while alternating solids and liquids. Check oral cavity for pocketing on both sides. Other contributing factors include hx of GERD, current CVA, and prolonged NPO status (3 days). Recommend dysphagia 1 diet with nectar thick liquids and strict adherence to aspiration precautions. Recommend oral care BID.   Speech/Language: Pt's expressive and  receptive language abilities assessed via informal tasks. Pt presents with suspected severe expressive and receptive language abilities c/b dysarthria and oral apraxia. Pt able to state his name, although with severely dysarthric speech. Pt followed 1/3 single step commands with max SLP cues. Answers to yes/no questions did not appear to be reliable. Pt made attempts to follow commands during oral motor evaluation, however complicated by oral apraxia + suspected receptive language deficits. Pt's relative strengths include repetition of single words including "1" "2" "3". He was unable to initiate automatic speech sequences independently. Pt would benefit from SLP services addressing aforementioned deficits both during acute stay and at next level of care.   SLP Visit Diagnosis: Dysphagia, oropharyngeal phase (R13.12);Cognitive communication deficit (R41.841);Aphasia (R47.01)    Aspiration Risk  Mild aspiration risk    Diet Recommendation Free water protocol after oral care;Dysphagia 1 (Puree);Nectar-thick liquid   Liquid Administration via: Spoon;Cup Medication Administration: Crushed with puree Supervision: Staff to assist with self feeding;Full supervision/cueing for compensatory strategies Compensations: Small sips/bites;Monitor for anterior loss;Follow solids with liquid Postural Changes: Seated upright at 90 degrees    Other  Recommendations Oral Care Recommendations: Oral care BID;Oral care prior to ice chip/H20   Follow up Recommendations Skilled Nursing facility;Inpatient Rehab      Frequency and Duration min 3x week  2 weeks       Prognosis Prognosis for Safe Diet Advancement: Fair Barriers to Reach Goals: Language deficits;Cognitive deficits      Swallow Study   General Date of Onset: 11/07/16 HPI: Pt is a 56 y.o male brought in from [place] for altered mental status. Unable to get any good history from the patient. He does answer a few yes or no questions.  Pt found to have  Upper GI bleed with melena and Acute encephalopathy due to acute CVA-MRI with bilateral watershed CVA. PMH includes HTN, DBM, GERD, CAD, and bipolar disorder.  Per RN note Pt's son is not happy with tx his dad is getting here.  Frustrated that he's not eaten since he was admitted.  RN Explained that he has been very sleepy and is at risk for aspiration.  He has also been NPO for EGD r/t GI Bleed. Pt is currently NPO awaiting clinical swallow evaluation. Pt unable to provide hx given aphasia and suspected cognitive-linguistic deficits.  He was resting in bed when approached.  He was easily aroused via verbal and tactile cues. Type of Study: Bedside Swallow Evaluation Diet Prior to this Study: NPO Temperature Spikes Noted: N/A Respiratory Status: Room air History of Recent Intubation: No Behavior/Cognition: Cooperative;Lethargic/Drowsy Oral Cavity Assessment: Within Functional Limits Oral Care Completed by SLP: No Oral Cavity - Dentition: Missing dentition Vision: Functional for self-feeding Self-Feeding Abilities: Needs assist;Needs set up Patient Positioning: Upright in bed Baseline Vocal Quality: Normal Volitional Cough: Cognitively unable to elicit Volitional Swallow: Able to elicit    Oral/Motor/Sensory Function Overall Oral Motor/Sensory Function: Moderate impairment Facial ROM: Reduced right Facial Symmetry: Abnormal symmetry right Facial Strength: Reduced left;Reduced right;Other (Comment) (Greater reduction in strength on R) Lingual ROM: Reduced right;Reduced left;Other (Comment) (Greater reduction on R) Lingual Symmetry: Abnormal symmetry right Lingual Strength: Reduced Mandible: Impaired   Ice Chips Ice  chips: Within functional limits Presentation: Spoon Other Comments: No overt s/sx of aspiration.   Thin Liquid Thin Liquid: Impaired Presentation: Straw;Cup;Spoon Oral Phase Impairments: Reduced labial seal;Reduced lingual movement/coordination Oral Phase Functional  Implications: Right anterior spillage Pharyngeal  Phase Impairments: Suspected delayed Swallow;Throat Clearing - Delayed Other Comments: Pt (+) throat clear x1 with sips of water.  Difficulty sucking water through straw, however able to given wait time.  Swallow initiation appeared to be delayed.    Nectar Thick Nectar Thick Liquid: Impaired Presentation: Cup Oral Phase Impairments: Reduced labial seal Oral phase functional implications: Right anterior spillage Other Comments: Pt (+) anterior spillage on R, however sensate to such and able to clear.  No overt s/sx of aspiration observed.   Honey Thick Honey Thick Liquid: Not tested   Puree Puree: Impaired Presentation: Spoon Oral Phase Impairments: Reduced labial seal Oral Phase Functional Implications: Right anterior spillage;Prolonged oral transit Other Comments: Oral dysphagia, however no overt s/sx of aspiration.   Solid   GO   Solid: Impaired Oral Phase Impairments: Reduced labial seal;Reduced lingual movement/coordination;Impaired mastication Other Comments: Prolonged and minimally effective mastication, requiring liquid washes to clear residue.  Not appropropriate for hard solids at this time.        [name] [name] 11/11/2016,9:15 AM

## 2016-11-11 NOTE — Progress Notes (Signed)
Arlyss Repress, MD [address]  [address]  [place], [place] [address]  Main: [phone]  Fax: [phone] Pager: [phone]   [name] [name]. is being followed for melena and acute anemia Day 4 of follow up    Subjective: More alert today but a aphasic, he opened eyes when I called his name, started on dysphagia diet No episodes of melena since admission, hemoglobin has been stable  Objective: Vital signs in last 24 hours: Vitals:   11/10/16 2057 11/11/16 0614 11/11/16 1019 11/11/16 1328  BP: 138/79 (!) 157/79 135/86 127/83  Pulse: 99 74 82 61  Resp: 20 20    Temp:  98.5 F (36.9 C) 98.1 F (36.7 C) 97.8 F (36.6 C)  TempSrc:   Oral Oral  SpO2: 98%  99% (!) 82%  Weight:      Height:       Weight change:   Intake/Output Summary (Last 24 hours) at 11/11/16 1732 Last data filed at 11/10/16 2000  Gross per 24 hour  Intake                0 ml  Output                0 ml  Net                0 ml     Exam: Heart:: Regular rate and rhythm or S1S2 present Lungs: clear to auscultation Abdomen: soft, nontender, normal bowel sounds   Lab Results: @ Micro Results: Recent Results (from the past 240 hour(s))  MRSA PCR Screening     Status: None   Collection Time: 11/07/16  5:30 PM  Result Value Ref Range Status   MRSA by PCR NEGATIVE NEGATIVE Final    Comment:        The GeneXpert MRSA Assay (FDA approved for NASAL specimens only), is one component of a comprehensive MRSA colonization surveillance program. It is not intended to diagnose MRSA infection nor to guide or monitor treatment for MRSA infections.    Studies/Results: No results found. Medications: I have reviewed the patient's current medications. Scheduled Meds: . acetaminophen  650 mg Oral Once  . aspirin  81 mg Oral Daily  . atorvastatin  40 mg Oral q1800  . folic acid  1 mg Oral Daily  . insulin aspart  0-5 Units Subcutaneous QHS  . insulin aspart  0-9 Units  Subcutaneous TID WC  . mouth rinse  15 mL Mouth Rinse BID  . multivitamin with minerals  1 tablet Oral Daily  . pantoprazole  40 mg Oral BID AC  . thiamine  100 mg Oral Daily   Or  . thiamine  100 mg Intravenous Daily  . traZODone  150 mg Oral QHS   Continuous Infusions:  PRN Meds:.acetaminophen **OR** acetaminophen, LORazepam, ondansetron (ZOFRAN) IV   Assessment: Active Problems:   GI bleed  [name] [name]. is a 56 y.o. African-American male with polysubstance abuse, history of stroke, diabetes presented with epigastric pain, altered mental status and found to have melena and acute anemia. Does not have cirrhosis or chronic liver disease. Differentials include upper GI source such as peptic ulcer disease or erosive esophagitis or AVMs or a small bowel source or less likely right colonic source. MRI revealed acute bilateral ischemic infarcts. Ischemic stroke is likely secondary to acute GI bleed resulting in anemia. No melenic episodes since admission. Hemoglobin is stable. Ferritin 86, B12 below 300, mildly low. Stroke team is following.  Evaluated by speech pathology and currently on dysphagia diet  Plan:  - Endoscopic evaluation in the setting of acute stroke is associated with higher risks compared to benefit when patient is not actively bleeding - Switch to Protonix twice a day - Recommend B12 injection - Monitor for signs of bleeding - Ok with GI to start anti-platelet agents if deemed necessary per neurology  GI will sign off for now. Please call us back with questions/concerns   LOS: 4 days   Rohini Vanga 11/11/2016, 5:32 PM

## 2016-11-12 LAB — BLOOD GAS, ARTERIAL
Acid-base deficit: 3.6 mmol/L — ABNORMAL HIGH (ref 0.0–2.0)
BICARBONATE: 20.1 mmol/L (ref 20.0–28.0)
FIO2: 0.21
O2 Saturation: 92.8 %
PATIENT TEMPERATURE: 37
pCO2 arterial: 31 mmHg — ABNORMAL LOW (ref 32.0–48.0)
pH, Arterial: 7.42 (ref 7.350–7.450)
pO2, Arterial: 65 mmHg — ABNORMAL LOW (ref 83.0–108.0)

## 2016-11-12 LAB — GLUCOSE, CAPILLARY
GLUCOSE-CAPILLARY: 125 mg/dL — AB (ref 65–99)
GLUCOSE-CAPILLARY: 120 mg/dL — AB (ref 65–99)
GLUCOSE-CAPILLARY: 130 mg/dL — AB (ref 65–99)
Glucose-Capillary: 126 mg/dL — ABNORMAL HIGH (ref 65–99)

## 2016-11-12 MED ORDER — CLOPIDOGREL BISULFATE 75 MG PO TABS
75.0000 mg | ORAL_TABLET | Freq: Every day | ORAL | Status: DC
  Administered 2016-11-13 – 2016-11-15 (×3): 75 mg via ORAL
  Filled 2016-11-12 (×3): qty 1

## 2016-11-12 NOTE — Progress Notes (Signed)
PT is recommending SNF. Clinical Child psychotherapist (CSW) discussed case with RN case manager and palliative is having a family meeting today. Patient is having trouble swallowing. CSW is waiting on palliative recommendations and outcome of family meeting. Disposition is undetermined at this time.   [employer], LCSW [phone]

## 2016-11-12 NOTE — Plan of Care (Addendum)
Family meeting scheduled at 12:00 with [facility]). He was not present for meeting. Attempted to call him and call went straight to voicemail. Spoke with [name] [name] the patient's sister. She could not provide me a number for [name] [name] (daughter), but stated she would try to reach her and call as soon as possible.   Of note: Patient is not from [facility], there is a patient Advice worker who resides at that facility.

## 2016-11-12 NOTE — Progress Notes (Signed)
Called into room by [facility] with Speech therapy. Pt exhibiting long periods of apnea which is a new neurological change. About every 30 seconds Pt becomes still, closes eyes, and apneic with recovery abdominal breathing. VSS. Oxygen 100% room air.   Dr. Thad Ranger notified due to neurological change, recommended calling attending physician.   Dr. Elpidio Anis notified. See new orders.

## 2016-11-12 NOTE — Progress Notes (Signed)
Physical Therapy Treatment Patient Details Name: [name] [name]. MRN: [id number] DOB: [birth year] Today's Date: 11/12/2016    History of Present Illness [name] [name]  is a 56 y.o. male brought in for altered mental status. Patient's hemoglobin a couple days ago was 13.5 and today at 7.5. Rectal exam showed melena and was of course guaiac positive. Urine toxicology was negative. He is now admitted for upper GIB and acute encephalopathy secondary to CVA. Pt is unable to provide any information today so all history obtained from medical record. Attempted to contact 2 separate family members on the telephone without any answer.     PT Comments    Attempted session this am.  Pt very lethargic.  Unable to properly awaken pt despite AA/PROM BLE's.  Max assist to reposition in bed for comfort.  Mobility skills not appropriate at this time.  Follow Up Recommendations  SNF     Equipment Recommendations       Recommendations for Other Services       Precautions / Restrictions Precautions Precautions: Fall Restrictions Weight Bearing Restrictions: No    Mobility  Bed Mobility                  Transfers                    Ambulation/Gait             General Gait Details: Unable at this time   Stairs            Wheelchair Mobility    Modified Rankin (Stroke Patients Only)       Balance                                            Cognition Arousal/Alertness: Lethargic;Suspect due to medications Behavior During Therapy: Flat affect Overall Cognitive Status: No family/caregiver present to determine baseline cognitive functioning                                        Exercises Other Exercises Other Exercises: AAROM/PROM for SLR, ab/add, heel slides and ankle pumps in attempt to awaken pt.    General Comments        Pertinent Vitals/Pain Faces Pain Scale: No hurt    Home Living                       Prior Function            PT Goals (current goals can now be found in the care plan section)      Frequency    7X/week      PT Plan      Co-evaluation              AM-PAC PT "6 Clicks" Daily Activity  Outcome Measure  Difficulty turning over in bed (including adjusting bedclothes, sheets and blankets)?: Unable Difficulty moving from lying on back to sitting on the side of the bed? : Unable Difficulty sitting down on and standing up from a chair with arms (e.g., wheelchair, bedside commode, etc,.)?: Unable Help needed moving to and from a bed to chair (including a wheelchair)?: Total Help needed walking in hospital room?: Total Help needed climbing 3-5 steps with a railing? : Total 6  Click Score: 6    End of Session     Patient left: in bed;with bed alarm set;with call bell/phone within reach         Time: 1017-1025 PT Time Calculation (min) (ACUTE ONLY): 8 min  Charges:  $Therapeutic Exercise: 8-22 mins                    G Codes:       Danielle Dess, PTA 11/12/16, 11:04 AM

## 2016-11-12 NOTE — Progress Notes (Signed)
[facility] [place] at [facility]   PATIENT NAME: [name] [name]    MR#:  [id number]  DATE OF BIRTH:  29-Aug-1960  SUBJECTIVE:   Patient has fluctuating mental status. Has periods of apnea. Sats normal. Afebrile  Unable to provide any history  REVIEW OF SYSTEMS:   Review of Systems  Unable to perform ROS: Mental status change   DRUG ALLERGIES:  No Known Allergies  VITALS:  Blood pressure 119/89, pulse 79, temperature 98.4 F (36.9 C), temperature source Oral, resp. rate 20, height  (1.778 m), weight 102.1 kg (225 lb), SpO2 100 %.  PHYSICAL EXAMINATION:   Physical Exam  GENERAL:  56 y.o.-year-old patient lying in the bed. sedated EYES: Pupils equal, round, reactive to light and accommodation. No scleral icterus. Extraocular muscles intact.  HEENT: Head atraumatic, normocephalic. Oropharynx and nasopharynx clear.  NECK:  Supple, no jugular venous distention. No thyroid enlargement, no tenderness.  LUNGS: Normal breath sounds bilaterally, no wheezing, rales, rhonchi. No use of accessory muscles of respiration.  CARDIOVASCULAR: S1, S2 normal. No murmurs, rubs, or gallops.  ABDOMEN: Soft, nontender, nondistended. Bowel sounds present. No organomegaly or mass.  EXTREMITIES: No cyanosis, clubbing or edema b/l.    NEUROLOGIC: moves all extremities PSYCHIATRIC:  patient is sedated SKIN: No obvious rash, lesion, or ulcer.   LABORATORY PANEL:  CBC  Recent Labs Lab 11/10/16 0723  WBC 12.7*  HGB 10.2*  HCT 28.8*  PLT 351    Chemistries   Recent Labs Lab 11/07/16 1323  11/10/16 0723  NA 139  < > 143  K 4.6  < > 3.6  CL 111  < > 112*  CO2 19*  < > 21*  GLUCOSE 236*  < > 115*  BUN 36*  < > 35*  CREATININE 1.27*  < > 1.24  CALCIUM 8.3*  < > 8.9  AST 18  --   --   ALT 20  --   --   ALKPHOS 48  --   --   BILITOT 0.6  --   --   < > = values in this interval not displayed. Cardiac Enzymes  Recent Labs Lab 11/07/16 1323  TROPONINI  <0.03   RADIOLOGY:  No results found. ASSESSMENT AND PLAN:  [name] [name]  is a 56 y[place]  Patient's hemoglobin a couple days ago was 13.5 and today at 7.5. Rectal exam showed melena   * Upper GI bleed with melena. Acute hemorrhagic anemia. Hemoglobin dropped from 13.5 a few days ago to 7.5  - 3 units PRBC transfused this admission and HB is improved. Now stable - change to PO Protonix - Hold Plavix and naproxen.  - EGD on hold due to acute CVA per GI _ started on ASA for CVA. Monitor for bleeding  * Acute encephalopathy due to acute CVA - MRI with bilateral watershed CVA - Urine toxicology negative.  - Echo and carotids - Nothing acute - Neurology input noted. Discussed with Dr. Thad Ranger - ASA 81 mg start today  * Dysphagia due to acute CVA. Seen by speech therapy.  Was on dysphagia 2 diet yesterday but today he is more drowzy. High risk for aspiration  * Essential hypertension. Hold antihypertensive medications  * Diabetes mellitus. Sliding scale for now  * History of GERD on Protonix drip  * History of coronary artery disease  *  History of bipolar disorder  Likely has sleep apnea. Also  worsened by sedative medications being used for his agitation.  will check ABG.  Consultation palliative care. At this time will need family meeting for goals of care.   Case discussed with Care Management/Social Worker.  CODE STATUS: FULL  DVT Prophylaxis: SCD/TEDs (due to GI bleed)  TOTAL TIME TAKING CARE OF THIS PATIENT: 35 minutes.   POSSIBLE D/C IN few DAYS, DEPENDING ON CLINICAL CONDITION.  Note: This dictation was prepared with Dragon dictation along with smaller phrase technology. Any transcriptional errors that result from this process are unintentional.  Milagros Loll R M.D on 11/12/2016 at 12:47 PM  Between 7am to 6pm - Pager - [phone]  After 6pm go to [email or web address] - password [email or web address]  [facility]  Office  [phone]  CC: Primary care physician; Preston Fleeting, MDPatient ID: [name] [name]., male   DOB: 21-Jun-1960, 56 y.o.   MRN: [id number]

## 2016-11-12 NOTE — Progress Notes (Signed)
High fall risk. AMS. Room near nurses station. Pt place on low bed. Fall mats placed on both side of bed. Yellow non-skid socks placed. Yellow arm band on.

## 2016-11-12 NOTE — Progress Notes (Signed)
Speech Language Pathology Treatment: Dysphagia  Patient Details Name: [name] [name]. MRN: [id number] DOB: [birth year] Today's Date: 11/12/2016 Time: 4098-1191 SLP Time Calculation (min) (ACUTE ONLY): 50 min  Assessment / Plan / Recommendation Clinical Impression  Pt seen today for toleration of recommended dysphagia diet; po trials as part of dysphagia tx. Upon entering room, noted pt had a congested, wet cough but was unable to successfully expectorate phlegmy secretions consistently d/t follow through, Cognitive status. Pt required a moderate amount of verbal/tactile cues to maintain attention to task; noted pt's eyes often closed and attention waned. Speech remains garbled w/ poor intelligibility - Apraxic-Dysarthric like.  Of particular note: this SLP noted what appeared to be APNEA moments when sitting w/ pt as po trials were initiated(when pt became quiet, breathing stopped both abdominal/clavicular and his eyes closed). Pt appeared to present w/ frequent, consistent moments of Apnea - each ~30 seconds long w/ rapid recovery breathing after. He was not fully responsive during the Apnea moments. With po trials of Puree and Nectar liquids via TSP, pt appeared to present w/ oral phase deficits likely impacted by the drowsiness of insuing Apnea(slow bolus management and manipulation, bolus holding if closing eyes). When pt was alert/awake, he continued to exhibit slow A-P manipulation and oral clearing; diffuse residue remained. Pt also exhibited delayed throat clearing and congested coughing post trials. Few trials were given d/t the frequency of the Apnea moments b/f trials were stopped altogether. MD and NSG were consulted to view pt's presentation(apparent Apnea) episodes. Explained to MD that giving po's during such presentation when pt's respiratory status and alertness are not consistent and stable greatly increases pt's risk for aspiration of any consistency; pt's current congestion and cough  may be related to his poor toleration of po's at this time. SLP recommended NPO status. MD agreed. ST services will f/u w/ pt's status next 1-2 days for ongoing assessment at bedside for appropriateness for oral diet. Recommend frequent oral care for hygiene and stimulation of swallowing; NSG to assist w/ oral care d/t pt's Cognitive status.    HPI HPI: Pt is a 56 y.o male brought in from NH(?) but some reports are that he lives at home d/t for altered mental status. Pt has multiple medical issues including CAD, DM, HTN, GERD, obesity, Bipolar Dis., polysubstance abuse, CVAs w/ multiple infarcts, depression. Unable to get any good history from the patient. He does answer a few yes or no questions.  Pt found to have Upper GI bleed with melena and Acute encephalopathy due to acute CVA-MRI with bilateral watershed CVA. Per RN note Pt been NPO for EGD r/t GI Bleed as well as d/t being too sleepy, agitated. Pt is unable to provide much hx/information given aphasia and suspected cognitive-linguistic deficits. Speech is garbled, thick, and Apraxic-dysarthric-appearing. He was resting upon entering room but aroused via verbal and tactile cues. Pt exhibited a congested, wet cough at baseline prior to po trials given.       SLP Plan  Continue with current plan of care;MBS;Consult other service (comment) (Palliative Care is following pt and meeting w/ family)       Recommendations  Diet recommendations: NPO Medication Administration: Via alternative means                General recommendations:  (Palliative Care; Dietitian) Oral Care Recommendations: Oral care QID;Staff/trained caregiver to provide oral care Follow up Recommendations: Skilled Nursing facility SLP Visit Diagnosis: Dysphagia, oropharyngeal phase (R13.12) Plan: Continue with current plan of care;MBS;Consult  other service (comment) (Palliative Care is following pt and meeting w/ family)       GO               Jerilynn Som,  MS, CCC-SLP Queenie Aufiero 11/12/2016, 6:11 PM

## 2016-11-12 NOTE — Progress Notes (Signed)
Telesitter placed in patient's room for additional safety measures.

## 2016-11-12 NOTE — NC FL2 (Signed)
[place] MEDICAID FL2 LEVEL OF CARE SCREENING TOOL     IDENTIFICATION  Patient Name: [name] [name]. Birthdate: [birth year] Sex: male Admission Date (Current Location): 11/07/2016  [place] Number:  [address]  ([id number]) Facility and Address:  [facility], [address] [place], [place] [address]      Provider Number: [id number]  Attending Physician Name and Address:  Milagros Loll, MD  Relative Name and Phone Number:       Current Level of Care: Hospital Recommended Level of Care: Skilled Nursing Facility Prior Approval Number:    Date Approved/Denied:   PASRR Number:    Discharge Plan: SNF    Current Diagnoses: Patient Active Problem List   Diagnosis Date Noted  . GI bleed 11/07/2016  . Accelerated hypertension 06/07/2016  . GERD (gastroesophageal reflux disease) 06/07/2016  . Bipolar affective disorder (HCC) 06/07/2016  . Chest pain 06/07/2016  . Unspecified Depressive disorder 04/17/2016  . 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 Tobacco use disorder 11/07/2014  . Stimulant use disorder (HCC) crack cocaine 11/07/2014  . Alcohol use disorder, moderate, dependence (HCC) 11/07/2014  . CAD (coronary artery disease) 11/05/2014  . Cerebral infarction (HCC) 10/19/2014  . LVH (left ventricular hypertrophy) 05/20/2013  . Obesity (BMI 30-39.9) 04/10/2013  . Aortic stenosis, mild 03/07/2013  . DM (diabetes mellitus), type 2, uncontrolled (HCC) 01/05/2013  . Essential hypertension, malignant 01/25/2009    Orientation RESPIRATION BLADDER Height & Weight     Self  Normal Incontinent Weight: 225 lb (102.1 kg) Height:   (177.8 cm)  BEHAVIORAL SYMPTOMS/MOOD NEUROLOGICAL BOWEL NUTRITION STATUS      Continent Diet (DYS 2; Nectar Thick)  AMBULATORY STATUS COMMUNICATION OF NEEDS Skin   Extensive Assist Verbally Normal                       Personal Care Assistance Level of Assistance   Bathing, Feeding, Dressing Bathing Assistance: Limited assistance Feeding assistance: Independent Dressing Assistance: Limited assistance     Functional Limitations Info  Sight, Hearing, Speech Sight Info: Adequate Hearing Info: Adequate Speech Info: Impaired    SPECIAL CARE FACTORS FREQUENCY  PT (By licensed PT), OT (By licensed OT)     PT Frequency:  (5) OT Frequency:  (5)            Contractures      Additional Factors Info  Code Status, Allergies Code Status Info:  (Full Code) Allergies Info:  (No Known Allergies)           Current Medications (11/12/2016):  This is the current hospital active medication list Current Facility-Administered Medications  Medication Dose Route Frequency Provider Last Rate Last Dose  . acetaminophen (TYLENOL) tablet 650 mg  650 mg Oral Q6H PRN Alford Highland, MD       Or  . acetaminophen (TYLENOL) suppository 650 mg  650 mg Rectal Q6H PRN Wieting, Richard, MD      . acetaminophen (TYLENOL) tablet 650 mg  650 mg Oral Once Alford Highland, MD      . aspirin chewable tablet 81 mg  81 mg Oral Daily Sudini, Srikar, MD   81 mg at 11/12/16 0830  . atorvastatin (LIPITOR) tablet 40 mg  40 mg Oral q1800 Sudini, Srikar, MD      . folic acid (FOLVITE) tablet 1 mg  1 mg Oral Daily Houston Siren, MD   1 mg at 11/12/16 0830  . insulin aspart (novoLOG) injection 0-5  Units  0-5 Units Subcutaneous QHS Wieting, Richard, MD      . insulin aspart (novoLOG) injection 0-9 Units  0-9 Units Subcutaneous TID WC Alford Highland, MD   1 Units at 11/12/16 0831  . LORazepam (ATIVAN) injection 0.5 mg  0.5 mg Intravenous Q4H PRN Milagros Loll, MD   0.5 mg at 11/12/16 0830  . MEDLINE mouth rinse  15 mL Mouth Rinse BID Milagros Loll, MD   15 mL at 11/12/16 0831  . multivitamin with minerals tablet 1 tablet  1 tablet Oral Daily Houston Siren, MD   1 tablet at 11/12/16 0830  . ondansetron (ZOFRAN) injection 4 mg  4 mg Intravenous Q6H PRN Shaune Pollack, MD   4 mg  at 11/10/16 2002  . pantoprazole (PROTONIX) EC tablet 40 mg  40 mg Oral BID AC Sudini, Wardell Heath, MD   40 mg at 11/12/16 0830  . thiamine (VITAMIN B-1) tablet 100 mg  100 mg Oral Daily Houston Siren, MD   100 mg at 11/12/16 0830   Or  . thiamine (B-1) injection 100 mg  100 mg Intravenous Daily Houston Siren, MD   100 mg at 11/10/16 1136  . traZODone (DESYREL) tablet 150 mg  150 mg Oral QHS Alford Highland, MD   150 mg at 11/11/16 2030     Discharge Medications: Please see discharge summary for a list of discharge medications.  Relevant Imaging Results:  Relevant Lab Results:   Additional Information  (SSN: [id number])  Payton Spark, Student-Social Work

## 2016-11-12 NOTE — Progress Notes (Signed)
Patient alert throughout the day. Restless and agitated intermittently- Ativan needed twice today. Telesitter and low bed in place in room near nurses station for patient safety. Pt having difficulty understanding how to swallow- speech therapy notified for reassessment- pt now NPO. No family has called or visited patient today. Patient is FROM HOME ALONE- pt recommending SNF.

## 2016-11-12 NOTE — Progress Notes (Signed)
Pt alert, sitting up in bed. Medications crushed in applesauce. Pt opened mouth when stimulated by spoon but held applesauce+crushed meds in mouth, would not swallow. Dr. Elpidio Anis updated. Took breakfast tray away and will get speech to see patient again.

## 2016-11-12 NOTE — Progress Notes (Signed)
Subjective: Patient more lethargic today.  Received Trazodone overnight and two doses of Ativan today.  Has had some periods of apnea.    Objective: Current vital signs: BP 119/89 (BP Location: Right Arm)   Pulse 79   Temp 98.4 F (36.9 C) (Oral)   Resp 20   Ht  (1.778 m)   Wt 102.1 kg (225 lb)   SpO2 100%   BMI 32.28 kg/m  Vital signs in last 24 hours: Temp:  [98.4 F (36.9 C)] 98.4 F (36.9 C) (10/08 2111) Pulse Rate:  [79-87] 79 (10/09 1225) Resp:  [20-21] 20 (10/09 1225) BP: (119-130)/(85-89) 119/89 (10/09 1225) SpO2:  [100 %] 100 % (10/09 1225)  Intake/Output from previous day: 10/08 0701 - 10/09 0700 In: 60 [P.O.:60] Out: -  Intake/Output this shift: No intake/output data recorded. Nutritional status: Diet NPO time specified  Neurologic Exam: Mental Status: Lethargic.  Follows commands.  Speech dysarthric.   Cranial Nerves: II: Discs flat bilaterally; Pupils equal, round, reactive to light and accommodation.  Does not blink to confrontation from the right III,IV, VI: ptosis not present, extra-ocular motions intact bilaterally V,VII: right facial droop VIII: hearing normal bilaterally IX,X: gag reflex unable to be tested XI: bilateral shoulder shrug XII: unable to be tested Motor: Moves all extremities weakly  Lab Results: Basic Metabolic Panel:  Recent Labs Lab 11/07/16 1323 11/08/16 0213 11/10/16 0723  NA 139 141 143  K 4.6 3.5 3.6  CL 111 115* 112*  CO2 19* 21* 21*  GLUCOSE 236* 137* 115*  BUN 36* 31* 35*  CREATININE 1.27* 1.22 1.24  CALCIUM 8.3* 8.1* 8.9    Liver Function Tests:  Recent Labs Lab 11/07/16 1323  AST 18  ALT 20  ALKPHOS 48  BILITOT 0.6  PROT 6.5  ALBUMIN 3.6   No results for input(s): LIPASE, AMYLASE in the last 168 hours. No results for input(s): AMMONIA in the last 168 hours.  CBC:  Recent Labs Lab 11/07/16 1323 11/07/16 1759 11/08/16 0213 11/08/16 1001 11/08/16 1746 11/09/16 0200 11/09/16 0955  11/10/16 0723  WBC 13.7*  --  13.3*  --   --   --   --  12.7*  NEUTROABS 10.9*  --   --   --   --   --   --  8.4*  HGB 7.5* 7.4* 7.8* 8.3* 9.1* 8.9* 9.4* 10.2*  HCT 22.1* 21.4* 22.1*  --   --   --   --  28.8*  MCV 91.1  --  90.8  --   --   --   --  93.6  PLT 286  --  244  --   --   --   --  351    Cardiac Enzymes:  Recent Labs Lab 11/07/16 1323  TROPONINI <0.03    Lipid Panel:  Recent Labs Lab 11/09/16 0200  CHOL 91  TRIG 85  HDL 24*  CHOLHDL 3.8  VLDL 17  LDLCALC 50    CBG:  Recent Labs Lab 11/11/16 1143 11/11/16 1644 11/11/16 2110 11/12/16 0747 11/12/16 1136  GLUCAP 131* 130* 119* 125* 120*    Microbiology: Results for orders placed or performed during the hospital encounter of 11/07/16  MRSA PCR Screening     Status: None   Collection Time: 11/07/16  5:30 PM  Result Value Ref Range Status   MRSA by PCR NEGATIVE NEGATIVE Final    Comment:        The GeneXpert MRSA Assay (FDA approved for NASAL  specimens only), is one component of a comprehensive MRSA colonization surveillance program. It is not intended to diagnose MRSA infection nor to guide or monitor treatment for MRSA infections.     Coagulation Studies: No results for input(s): LABPROT, INR in the last 72 hours.  Imaging: No results found.  Medications:  I have reviewed the patient's current medications. Scheduled: . acetaminophen  650 mg Oral Once  . aspirin  81 mg Oral Daily  . atorvastatin  40 mg Oral q1800  . folic acid  1 mg Oral Daily  . insulin aspart  0-5 Units Subcutaneous QHS  . insulin aspart  0-9 Units Subcutaneous TID WC  . mouth rinse  15 mL Mouth Rinse BID  . multivitamin with minerals  1 tablet Oral Daily  . pantoprazole  40 mg Oral BID AC  . thiamine  100 mg Oral Daily   Or  . thiamine  100 mg Intravenous Daily  . traZODone  150 mg Oral QHS    Assessment/Plan: Patient with some lethargy today.  Has some periods of apnea today.  Suspect some sleep apnea.   Neurological examination otherwise unchanged.  Would rule out aspiration PNA as well.    Recommendations: 1.  CXR 2.  GI has approved ASA and Plavix.   Would restart.  3.  Limit sedating medications     LOS: 5 days   Thana Farr, MD Neurology [phone] 11/12/2016  1:37 PM

## 2016-11-13 LAB — GLUCOSE, CAPILLARY
GLUCOSE-CAPILLARY: 126 mg/dL — AB (ref 65–99)
GLUCOSE-CAPILLARY: 127 mg/dL — AB (ref 65–99)
Glucose-Capillary: 137 mg/dL — ABNORMAL HIGH (ref 65–99)
Glucose-Capillary: 246 mg/dL — ABNORMAL HIGH (ref 65–99)

## 2016-11-13 MED ORDER — HALOPERIDOL LACTATE 5 MG/ML IJ SOLN
2.0000 mg | Freq: Three times a day (TID) | INTRAMUSCULAR | Status: DC | PRN
  Administered 2016-11-14: 2 mg via INTRAMUSCULAR
  Filled 2016-11-13: qty 1

## 2016-11-13 MED ORDER — NICOTINE 21 MG/24HR TD PT24
21.0000 mg | MEDICATED_PATCH | Freq: Every day | TRANSDERMAL | Status: DC
  Administered 2016-11-14 – 2016-11-15 (×2): 21 mg via TRANSDERMAL
  Filled 2016-11-13 (×2): qty 1

## 2016-11-13 MED ORDER — NICOTINE 21 MG/24HR TD PT24
21.0000 mg | MEDICATED_PATCH | Freq: Every day | TRANSDERMAL | Status: DC
  Administered 2016-11-13: 21 mg via TRANSDERMAL
  Filled 2016-11-13: qty 1

## 2016-11-13 MED ORDER — LORAZEPAM 2 MG/ML IJ SOLN: 0.5000 mg | INTRAMUSCULAR | Status: DC | PRN

## 2016-11-13 MED ORDER — HALOPERIDOL LACTATE 5 MG/ML IJ SOLN
2.0000 mg | Freq: Once | INTRAMUSCULAR | Status: AC
  Administered 2016-11-13: 2 mg via INTRAMUSCULAR

## 2016-11-13 MED ORDER — HALOPERIDOL LACTATE 5 MG/ML IJ SOLN
2.0000 mg | Freq: Once | INTRAMUSCULAR | Status: DC
  Filled 2016-11-13: qty 1

## 2016-11-13 NOTE — Consult Note (Deleted)
Consultation Note Date: 11/13/2016   Patient Name: [name] Shimel Jr.  DOB:   11/20/1960        MRN:   [id number]      Age / Sex:       56 y.o., male  PCP: Revelo, Adrian Mancheno, MD Referring Physician: Kalisetti, Radhika, MD  Reason for Consultation: Establishing goals of care  HPI/Patient Profile:  [name] [name] a 56 y.o[place]. He was admitted for Acute CVA, and GIB. He has a history of HTN, DM, GERD, CAD, and bipolar disorder.   Clinical Assessment and Goals of Care: Went to see patient this morning, his sister Ann Jeffries was at the bedside asking for water for [name] [name]. We discussed that he would not be able to drink water at this time. She and I had spoken yesterday while attempting to gain a phone number for [name] [name], [name] [name]'s daughter, which at this time is not provided. She volunteered, " I know it's up to them", but stated she believes he would want everything done for him, and he nodded his head. She stated they are a very religious family and believes God can do anything, he nodded and said "praise God". She is aware there is a family meeting at 1:00 with [name] [name], hoping to relay this to [name] [name].   Met with patient and his son [name] Jr. at 1:00. [name] [name] is improving since yesterday and is able to nod his head, he is attempting to speak, though it is minimally to unintelligible, and is laughing appropriately.  Discussed his status and goals of care with his son. His son looked at his meal and asked "waht if he don't want to eat that?" We discussed the purpose of the diet and aggressive care versus comfort care, and decisions between the two. He states he is expecting a baby in January and his father smiled. Upon discussing the baby's name and sex, [name] [name]  laughed. Patient nodded in agreement, when son states he wants everything done for his father. His son stated "this is a second chance for you, what are you going to do with it?", talking to his father.  His son states [name] [name] has sleep apnea.    He is agreeable to PEG tube placement if needed. When discussing code status including chest compressions, defibrillation, medications, and intubation, his son said to do everything, his father nodded. When discussing ventilator placement he nodded, and when his son asked him how long he would want to remain on a ventilator, he did not respond. His son states we will make that decision at that time.        Patient with questionable waxing and waning mental status. Between conversations, patient (with nodding), sister, and son are in agreement.     SUMMARY OF RECOMMENDATIONS   Continue current course of treatment.   Code Status/Advance Care Planning:  Full code  Palliative Prophylaxis:   Aspiration, Bowel Regimen and Oral Care   Prognosis:   Unable to determine Depending on progress and oral intake.   Discharge Planning: To Be Determined      Primary Diagnoses: Present on Admission: . GI bleed   I have reviewed the medical record, interviewed the patient and family, and examined the patient. The following aspects are pertinent.      Past Medical History:  Diagnosis Date  . Aortic stenosis, mild February 2015  . Auditory hallucinations   . Bipolar disorder (HCC)   . Coronary artery   disease    No reported catheterization. Negative Myoview stress test and 2013.  . Depression   . Diabetes mellitus   . GERD (gastroesophageal reflux disease)   . History of suicide attempt   . Hx of medication noncompliance   . Hypertension   . Hypertensive hypertrophic cardiomyopathy (HCC) February 2015   Severe concentric LVH on echocardiogram.  . Obesity, Class II, BMI 35-39.9, with comorbidity   . Pericarditis     . Polysubstance abuse (HCC)    cocaine, marijuana  . Stroke (HCC) February 2015   Right MCA aneurysm; head MRI - acute infarct of the right frontal lobe. Smaller acute infarct of the left frontal lobe. Thought to be due to cerebral emboli. Left PICA infarct. Small chronic lacunar infarct right internal capsule. Chronic microvascular ischemic change with white matter change.  . Syncope and collapse    Social History        Social History  . Marital status: Single    Spouse name: N/A  . Number of children: N/A  . Years of education: N/A         Social History Main Topics  . Smoking status: Former Smoker    Packs/day: 0.50    Years: 40.00    Types: Cigars  . Smokeless tobacco: Never Used  . Alcohol use No     Comment: occasionally; reports 1 pint of vodka per month at paydaysocially  . Drug use: Yes    Frequency: 1.0 time per week    Types: Marijuana, Cocaine     Comment: past use; denies current; UDS positive for cocaine  . Sexual activity: Yes       Other Topics Concern  . None      Social History Narrative   Single with no children.  No exercise.   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   Has a history of polysubstance abuse including cocaine. No she she urine toxicology screen in the hospital was positive.  He also current smoker.   He is seen by behavioral health for bipolar disorder.        Family History  Problem Relation Age of Onset  . Stroke Mother   . Diabetes type II Mother   . Hypertension Mother   . Hypertension Father   . Arthritis Father   . Arthritis Sister   . Diabetes type II Sister   . Hypertension Sister    Scheduled Meds: . acetaminophen  650 mg Oral Once  . aspirin  81 mg Oral Daily  . atorvastatin  40 mg Oral q1800  . clopidogrel  75 mg Oral Daily  . folic acid  1 mg Oral Daily  . insulin aspart  0-5 Units Subcutaneous QHS  . insulin aspart  0-9  Units Subcutaneous TID WC  . mouth rinse  15 mL Mouth Rinse BID  . multivitamin with minerals  1 tablet Oral Daily  . pantoprazole  40 mg Oral BID AC  . thiamine  100 mg Oral Daily   Or  . thiamine  100 mg Intravenous Daily  . traZODone  150 mg Oral QHS   Continuous Infusions: PRN Meds:.acetaminophen **OR** acetaminophen, LORazepam, ondansetron (ZOFRAN) IV Medications Prior to Admission:         Prior to Admission medications   Medication Sig Start Date End Date Taking? Authorizing Provider  amLODipine (NORVASC) 10 MG tablet Take 1 tablet (10 mg total) by mouth daily. 04/17/16  Yes Hernandez-Gonzalez, Andrea,   MD  aspirin 81 MG EC tablet Take 1 tablet (81 mg total) by mouth daily. 04/17/16  Yes Hernandez-Gonzalez, Andrea, MD  atorvastatin (LIPITOR) 20 MG tablet Take 1 tablet (20 mg total) by mouth daily at 6 PM. 04/17/16  Yes Hernandez-Gonzalez, Andrea, MD  chlorthalidone (HYGROTON) 25 MG tablet Take 1 tablet (25 mg total) by mouth daily. 04/17/16  Yes Hernandez-Gonzalez, Andrea, MD  clopidogrel (PLAVIX) 75 MG tablet Take 1 tablet (75 mg total) by mouth daily. 04/17/16  Yes Hernandez-Gonzalez, Andrea, MD  insulin aspart (NOVOLOG) 100 UNIT/ML FlexPen Inject 10 Units into the skin 3 (three) times daily with meals. 07/26/13  Yes [provider]  metFORMIN (GLUCOPHAGE) 500 MG tablet Take 1 tablet (500 mg total) by mouth 2 (two) times daily with a meal. 04/17/16  Yes Hernandez-Gonzalez, Andrea, MD  naproxen (NAPROSYN) 500 MG tablet Take 1 tablet (500 mg total) by mouth 2 (two) times daily with a meal. 09/20/16  Yes Kinner, Robert, MD  nitroGLYCERIN (NITROSTAT) 0.4 MG SL tablet Place 1 tablet (0.4 mg total) under the tongue every 5 (five) minutes as needed for chest pain. 04/17/16  Yes Hernandez-Gonzalez, Andrea, MD  traZODone (DESYREL) 150 MG tablet Take 1 tablet (150 mg total) by mouth at bedtime. 04/17/16  Yes Hernandez-Gonzalez, Andrea, MD   No Known Allergies Review of Systems    Unable to perform ROS   Physical Exam  Constitutional: No distress.  Eyes: EOM are normal.  Cardiovascular:  Warm and dry  Pulmonary/Chest: Effort normal.  Neurological: He is alert.    Vital Signs: BP (!) 149/94 (BP Location: Right Arm)   Pulse 81   Temp (!) 97.3 F (36.3 C) (Oral)   Resp (!) 21   Ht 5' 10" (1.778 m)   Wt 102.1 kg (225 lb)   SpO2 93%   BMI 32.28 kg/m  Pain Assessment: PAINAD Pain Score: 0-No pain   SpO2: SpO2: 93 % O2 Device:SpO2: 93 % O2 Flow Rate: .O2 Flow Rate (L/min): 0 L/min  IO: Intake/output summary: No intake or output data in the 24 hours ending 11/13/16 1319  LBM: Last BM Date: 11/10/16 Baseline Weight: Weight: 102.1 kg (225 lb) Most recent weight: Weight: 102.1 kg (225 lb)     Palliative Assessment/Data: 40%    Time In: 1:45 Time Out: 2:00 Time Total: 1 hour 15 minutes Greater than 50%  of this time was spent counseling and coordinating care related to the above assessment and plan.  Signed by: Larae Caison, NP 11/13/2016 2:03 PM Office: [phone] 7am-7pm  Pager: [phone] Call primary team after hours   Please contact Palliative Medicine Team phone at [phone] for questions and concerns.  For individual provider: See Amion   

## 2016-11-13 NOTE — Progress Notes (Signed)
Physical Therapy Treatment Patient Details Name: [name] [name]. MRN: [id number] DOB: 08/31/1960 Today's Date: 11/13/2016    History of Present Illness [name] [name]  is a 56 y.o. male brought in for altered mental status. Patient's hemoglobin a couple days ago was 13.5 and today at 7.5. Rectal exam showed melena and was of course guaiac positive. Urine toxicology was negative. He is now admitted for upper GIB and acute encephalopathy secondary to CVA. Pt is unable to provide any information today so all history obtained from medical record. Attempted to contact 2 separate family members on the telephone without any answer.     PT Comments    Pt demonstrates considerable improvement with therapy today compared to prior sessions. He is much more alert and awake today. He is able to complete all seated and supine exercises as instructed. Pt requires mina+1 to stabilize with sit to stand transfers. Cues for safe hand placement in sitting and once in standing. Pt is stable with UE support on rolling walker and once standing only requires CGA from therapist. Pt ambulates with very narrow BOS, occasionally crossing midline with his steps. He stabilizes with use of rolling walker but requires intermittent minA+1 to stabilize due to LOB especially with turns. Pt with minimal spontaneous head turns and appears somewhat dazed during ambulation. He reports mild fatigue but VSS with ambulation. Pt fails to respond to some questions during ambulation and has decreased safety awareness. Pt demonstrates good tolerance for activity today. He was previously living alone and independent using a single point cane for ambulation. He is currently requiring support with ambulation and use of a rolling walker. He has good family support from his sister and also has children who are involved in his life. Updated discharge recommendation to inpatient rehab as I believe pt would be a good candidate given his decline from  baseline. Pt will benefit from PT services to address deficits in strength, balance, and mobility in order to return to full function at home.   Follow Up Recommendations  CIR     Equipment Recommendations  Other (comment) (TBD at next venue)    Recommendations for Other Services OT consult     Precautions / Restrictions Precautions Precautions: Fall Restrictions Weight Bearing Restrictions: No    Mobility  Bed Mobility Overal bed mobility: Modified Independent             General bed mobility comments: HOB elevated and use of bed rails. Increased time but able to complete without external assistance  Transfers Overall transfer level: Needs assistance Equipment used: Rolling walker (2 wheeled) Transfers: Sit to/from Stand Sit to Stand: Min assist         General transfer comment: Pt requires mina+1 to stabilize with sit to stand transfers. Cues for safe hand placement in sitting and once in standing. Pt is stable with UE support on rolling walker and once standing only requires CGA from therapist  Ambulation/Gait Ambulation/Gait assistance: Min assist Ambulation Distance (Feet): 60 Feet Assistive device: Rolling walker (2 wheeled) Gait Pattern/deviations: Decreased step length - right;Decreased step length - left;Narrow base of support Gait velocity: Decreased Gait velocity interpretation: <1.8 ft/sec, indicative of risk for recurrent falls General Gait Details: Pt ambulates with very narrow BOS, occasionally crossing midline. He stabilizes with use of rolling walker but requires intermittent minA+1 to stabilize due to LOB especially with turns. Pt with minimal spontaneous head turns and appears somewhat dazed during ambulation. He reports mild fatigue but VSS with ambulation. Pt fails  to respond to some questions during ambulation and has decreased safety awareness   Stairs            Wheelchair Mobility    Modified Rankin (Stroke Patients Only)        Balance Overall balance assessment: Needs assistance Sitting-balance support: No upper extremity supported Sitting balance-Leahy Scale: Fair     Standing balance support: Bilateral upper extremity supported Standing balance-Leahy Scale: Fair Standing balance comment: Mostly CGA but intermittent minA+1 to stabilize                            Cognition Arousal/Alertness: Awake/alert Behavior During Therapy: Flat affect Overall Cognitive Status: Impaired/Different from baseline Area of Impairment: Orientation                 Orientation Level: Disoriented to;Time;Situation (At baseline family reports pt is fully A&O)                    Exercises General Exercises - Lower Extremity Long Arc Quad: Strengthening;Both;10 reps;Seated Heel Slides: Strengthening;Both;10 reps;Seated Hip ABduction/ADduction: Strengthening;Both;10 reps;Seated Straight Leg Raises: Strengthening;Both;10 reps;Supine Hip Flexion/Marching: Strengthening;Both;10 reps;Seated Heel Raises: Strengthening;Both;10 reps;Seated    General Comments        Pertinent Vitals/Pain Pain Assessment: No/denies pain    Home Living                      Prior Function            PT Goals (current goals can now be found in the care plan section) Acute Rehab PT Goals Patient Stated Goal: Return to prior function PT Goal Formulation: With family Progress towards PT goals: Progressing toward goals    Frequency    7X/week      PT Plan Discharge plan needs to be updated    Co-evaluation              AM-PAC PT "6 Clicks" Daily Activity  Outcome Measure  Difficulty turning over in bed (including adjusting bedclothes, sheets and blankets)?: None Difficulty moving from lying on back to sitting on the side of the bed? : None Difficulty sitting down on and standing up from a chair with arms (e.g., wheelchair, bedside commode, etc,.)?: A Little Help needed moving to and from a  bed to chair (including a wheelchair)?: A Little Help needed walking in hospital room?: A Little Help needed climbing 3-5 steps with a railing? : A Lot 6 Click Score: 19    End of Session Equipment Utilized During Treatment: Gait belt Activity Tolerance: Patient tolerated treatment well Patient left: in bed;with bed alarm set;with call bell/phone within reach;with family/visitor present Nurse Communication: Mobility status PT Visit Diagnosis: Unsteadiness on feet (R26.81);Muscle weakness (generalized) (M62.81);Difficulty in walking, not elsewhere classified (R26.2)     Time: 9604-5409 PT Time Calculation (min) (ACUTE ONLY): 23 min  Charges:  $Gait Training: 8-22 mins $Therapeutic Exercise: 8-22 mins                    G Codes:       Sharalyn Ink [place] PT, DPT     Sneijder Bernards 11/13/2016, 1:32 PM

## 2016-11-13 NOTE — Progress Notes (Signed)
[facility] [place] at [facility]   PATIENT NAME: [name] [name]    MR#:  [id number]  DATE OF BIRTH:  [birth year]  SUBJECTIVE:  CHIEF COMPLAINT:   Chief Complaint  Patient presents with  . Altered Mental Status   -known history of bipolar disorder, schizophrenia. Admitted with altered mental status and noted to have acute infarcts. -More alert today, significant dysarthria at baseline and much worsened.  REVIEW OF SYSTEMS:  Review of Systems  Unable to perform ROS: Medical condition    DRUG ALLERGIES:  No Known Allergies  VITALS:  Blood pressure (!) 149/94, pulse 81, temperature (!) 97.3 F (36.3 C), temperature source Oral, resp. rate (!) 21, height  (1.778 m), weight 102.1 kg (225 lb), SpO2 93 %.  PHYSICAL EXAMINATION:  Physical Exam  GENERAL:  56 y.o.-year-old obese patient sitting in the bed with no acute distress.  EYES: Pupils equal, round, reactive to light and accommodation. No scleral icterus. Extraocular muscles intact.  HEENT: Head atraumatic, normocephalic. Oropharynx and nasopharynx clear. Right facial droop slightly noted. NECK:  Supple, no jugular venous distention. No thyroid enlargement, no tenderness.  LUNGS: Normal breath sounds bilaterally, no wheezing, rales,rhonchi or crepitation. No use of accessory muscles of respiration. Decreased bibasilar breath sounds CARDIOVASCULAR: S1, S2 normal. No murmurs, rubs, or gallops.  ABDOMEN: Soft, nontender, nondistended. Bowel sounds present. No organomegaly or mass.  EXTREMITIES: No pedal edema, cyanosis, or clubbing.  NEUROLOGIC: Mild right facial droop, otherwise cranial nerves seems to be intact. Dysarthria noted. Strength in both upper extremities is 5/5 in both lower extremities is 4/5 with more giveaway weakness on the right side. -Sensation is intact. Gait is not checked  PSYCHIATRIC: The patient is alert and oriented to self.  SKIN: No obvious rash, lesion, or ulcer.     LABORATORY PANEL:   CBC  Recent Labs Lab 11/10/16 0723  WBC 12.7*  HGB 10.2*  HCT 28.8*  PLT 351   ------------------------------------------------------------------------------------------------------------------  Chemistries   Recent Labs Lab 11/07/16 1323  11/10/16 0723  NA 139  < > 143  K 4.6  < > 3.6  CL 111  < > 112*  CO2 19*  < > 21*  GLUCOSE 236*  < > 115*  BUN 36*  < > 35*  CREATININE 1.27*  < > 1.24  CALCIUM 8.3*  < > 8.9  AST 18  --   --   ALT 20  --   --   ALKPHOS 48  --   --   BILITOT 0.6  --   --   < > = values in this interval not displayed. ------------------------------------------------------------------------------------------------------------------  Cardiac Enzymes  Recent Labs Lab 11/07/16 1323  TROPONINI <0.03   ------------------------------------------------------------------------------------------------------------------  RADIOLOGY:  No results found.  EKG:   Orders placed or performed during the hospital encounter of 11/07/16  . ED EKG  . ED EKG    ASSESSMENT AND PLAN:   56 year old male with past medical history scant for bipolar disorder, sleep apnea, LVH, polysubstance abuse, schizophrenia, diabetes and hypertension presents to the hospital secondary to altered mental status.  #1 metabolic encephalopathy-secondary to underlying acute stroke -MRI with watershed infarcts, likely triggered by hypotension from hemorrhagic shock and admission. -Prior history of CVA with dysarthria at baseline. -Appreciate neurology consult. Okay to restart aspirin and Plavix per GI at this time. -Continue statin. -Physical therapy recommended acute inpatient rehabilitation  #2 acute anemia-secondary to upper GI bleed on presentation. Drop in hemoin noted to be at  7.5 on admission -Received third of 3 units packed RBC transfusion and hemoglobin is improved. -Appreciate GI consult. Outpatient EGD recommended. can start aspirin and  Plavix. -On Protonix  #3 dysphagia-secondary to acute CVA -Currently on a dysphagia diet. High risk for aspiration -Family agreeable for PEG tube if needed  #4 bipolar with schizophrenia-due to excessive sedation, some of the medications are on hold. Continue trazodone -when necessary Haldol for behavioral changes. -If needed, will consult psych  #5 hypertension-relatively low blood pressure since admission. Medications are on hold at this time.  #6 DVT Prophylaxis- TEDs and SCDs     All the records are reviewed and case discussed with Care Management/Social Workerr. Management plans discussed with the patient, family and they are in agreement.  CODE STATUS: Full code  TOTAL TIME TAKING CARE OF THIS PATIENT: 38 minutes.   POSSIBLE D/C IN 2-3 DAYS, DEPENDING ON CLINICAL CONDITION.   Fredda Clarida M.D on 11/13/2016 at 2:15 PM  Between 7am to 6pm - Pager - [phone]  After 6pm go to [email or web address] - Social research officer, government  [facility]  Office  [phone]  CC: Primary care physician; Preston Fleeting, MD

## 2016-11-13 NOTE — Progress Notes (Signed)
Speech Language Pathology Treatment: Dysphagia  Patient Details Name: [name] [name]. MRN: [id number] DOB: 09-18-1960 Today's Date: 11/13/2016 Time: 7829-5621 SLP Time Calculation (min) (ACUTE ONLY): 43 min  Assessment / Plan / Recommendation Clinical Impression  Pt seen today for ongoing assessment of appropriateness for oral diet and trials to determine toleration of po's. Pt much more alert/awake and not closing eyes or having the frequent Apnea-like moments as during tx session yesterday. Pt was more engaged and even verbalized intermittently(dysarthric-Apraxic speech). He was able to take part in the task of feeding himself w/ assist.  Pt consumed trials of Honey consistency liquids and purees presented via TSP(then cup w/ liquids). No overt s/s of aspiration were noted; no decline in respiratory status or wet vocal quality appreciated. Of note, pt's breathing was characteristic of rapid, "catch-up" breathing intermittently during the session as at baseline prior to the tx session beginning - continued Apnea-like moments(?). Oral phase was c/b slow, deliberate bolus manipulation and management for A-P transfer; slight R lingual/labial decreased tone noted but no R anterior spillage occurred w/ trials. Given time, pt was able to transfer, swallow and clear bolus trials assessed. Educated pt on using a lingual sweep w/ f/u swallow intermittently to aid oral clearing b/t trials. Pt was even able to hold the cup/spoon to drink himself and feed himself.  Due to pt's much improved mental presentation(alertness), and toleration of initial po trials, recommend initiate a Dysphagia level 1(puree) diet w/ HONEY consistency liquids; pills in Puree w/ NSG; aspiration precautions; assistance at all meals d/t fatigue and Cognition/communication. ST services will continue to f/u w/ pt's status and trials to upgrade diet consistency to least restrictive diet as appropriate. Objective assessment as determined.  MD/NSG updated; family updated. Marland Kitchen    HPI HPI: Pt is a 56 y.o male brought in from NH(?) but some reports are that he lives at home d/t for altered mental status. Pt has multiple medical issues including CAD, DM, HTN, GERD, obesity, Bipolar Dis., polysubstance abuse, CVAs w/ multiple infarcts, depression. Unable to get any good history from the patient. He does answer a few yes or no questions.  Pt found to have Upper GI bleed with melena and Acute encephalopathy due to acute CVA-MRI with bilateral watershed CVA. Per RN note Pt been NPO for EGD r/t GI Bleed as well as d/t being too sleepy, agitated. Pt is unable to provide much hx/information given aphasia and suspected cognitive-linguistic deficits. Speech is garbled, thick, and Apraxic-dysarthric-appearing. He was resting upon entering room but aroused via verbal and tactile cues. Pt was more alert/awake w/ less Apnea appearing moments. Pt more engaged verbally today - dysarthric speech appreciated(Sister stated pt's speech was somewhat dysarthric baseline d/t prior CVA). Pt was able to engage fairly consistently but continued to exhibit rapid, "catch-up" breathing intermittently prior to and during po trials.       SLP Plan  Continue with current plan of care (objective assessment when appropriate)       Recommendations  Diet recommendations: Dysphagia 1 (puree);Honey-thick liquid Liquids provided via: Teaspoon;Cup Medication Administration: Crushed with puree Compensations: Minimize environmental distractions;Slow rate;Small sips/bites;Lingual sweep for clearance of pocketing;Multiple dry swallows after each bite/sip;Follow solids with liquid Postural Changes and/or Swallow Maneuvers: Seated upright 90 degrees;Upright 30-60 min after meal                General recommendations:  (Dietician f/u) Oral Care Recommendations: Oral care BID;Patient independent with oral care;Staff/trained caregiver to provide oral care Follow up  Recommendations: Skilled Nursing facility SLP Visit Diagnosis: Dysphagia, oropharyngeal phase (R13.12) Plan: Continue with current plan of care (objective assessment when appropriate)       GO               Jerilynn Som, MS, CCC-SLP Watson,Katherine 11/13/2016, 12:25 PM

## 2016-11-13 NOTE — Progress Notes (Signed)
Rehab Admissions Coordinator Note:  Patient was screened by [name] [name] for appropriateness for an Inpatient Acute Rehab Consult per PT change in recommendations today.  At this time, we are recommending Skilled Nursing Facility. I reviewed chart and documentation of his social issues with limited caregiver support. I contacted SW with my recommendations for SNF. Limited bed availability at inpt rehab at this time and pt with decreased caregiver support.Please call me with any questions.  [name] [name] 11/13/2016, 2:46 PM  I can be reached at [phone].

## 2016-11-13 NOTE — Plan of Care (Addendum)
Family meeting today at 1:00 with [name] [name] (son). Unable to reach Arvid Right (patient's sister) to inquire about family meeting or to see if she was able to contact Delaney Meigs (daughter).

## 2016-11-13 NOTE — Progress Notes (Signed)
Per [facility] admissions coordinator at [facility] they can extend a bed offer. Clinical Social Worker (CSW) met with patient and made him aware of above. Patient accepted bed offer and is happy to be going to the facility that is father is in. CSW attempted to contact patient's sister Lelon Frohlich however she did not answer and a voicemail was left. CSW attempted to contact patient's daughter Jonelle Sidle however it went straight to voicemail and CSW left a voicemail. CSW also attempted to contact patient's son Danne Baxter however it went straight to voicemail and the voicemail was full.  PASARR is pending. Patient will have to be 24 hours without a tele-sitter prior to discharge.     [employer], LCSW [phone]

## 2016-11-13 NOTE — Clinical Social Work Placement (Addendum)
   CLINICAL SOCIAL WORK PLACEMENT  NOTE  Date:  11/13/2016  Patient Details  Name: [name] [name]. MRN: [id number] Date of Birth: [birth year]  Clinical Social Work is seeking post-discharge placement for this patient at the Skilled  Nursing Facility level of care (*CSW will initial, date and re-position this form in  chart as items are completed):  Yes   Patient/family provided with [facility] Clinical Social Work Department's list of facilities offering this level of care within the geographic area requested by the patient (or if unable, by the patient's family).  Yes   Patient/family informed of their freedom to choose among providers that offer the needed level of care, that participate in Medicare, Medicaid or managed care program needed by the patient, have an available bed and are willing to accept the patient.  Yes   Patient/family informed of [facility] ownership interest in [facility] and [facility], as well as of the fact that they are under no obligation to receive care at these facilities.  PASRR submitted to EDS on 11/12/16     PASRR number received on       Existing PASRR number confirmed on       FL2 transmitted to all facilities in geographic area requested by pt/family on 11/13/16     FL2 transmitted to all facilities within larger geographic area on       Patient informed that his/her managed care company has contracts with or will negotiate with certain facilities, including the following:        Yes   Patient/family informed of bed offers received.  Patient chooses bed at [facility]    Physician recommends and patient chooses bed at      Patient to be transferred to  [facility] on  11-15-16.  Patient to be transferred to facility by  Patient's sister will be transporting.     Patient family notified on  11-15-16 of transfer.  Name of family member notified:   [name] is sister who is aware.    PHYSICIAN        Additional Comment:    _______________________________________________ Sample, Darleen Crocker, LCSW 11/13/2016, 1:33 PM   Ervin Knack. Kendrick Haapala, MSW, Theresia Majors [phone]  11/15/2016 1:05 PM

## 2016-11-13 NOTE — Clinical Social Work Note (Signed)
Clinical Social Work Assessment  Patient Details  Name: [name] [name]. MRN: [id number] Date of Birth: May 17, 1960  Date of referral:  11/13/16               Reason for consult:  Discharge Planning, Facility Placement                Permission sought to share information with:  Chartered certified accountant granted to share information::  Yes, Verbal Permission Granted  Name::      [name] [name]::     Relationship::     Contact Information:     Housing/Transportation Living arrangements for the past 2 months:  Apartment Source of Information:  Other (Comment Required) (Sister [name] [name] ) Patient Interpreter Needed:  None Criminal Activity/Legal Involvement Pertinent to Current Situation/Hospitalization:  No - Comment as needed Significant Relationships:  Adult Children, Siblings Lives with:  Self Do you feel safe going back to the place where you live?    Need for family participation in patient care:  Yes (Comment)  Care giving concerns:  Patient lives in an apartment in [place] alone.    Social Worker assessment / plan:  Holiday representative (CSW) reviewed chart and noted that PT is recommending SNF. CSW met with patient and his sister [name] [name] was at bedside. [name] [name] home # [id number], cell # [phone]. Patient was not able to participate in assessment and was mumbling. Patient has a tele-sitter and low bed. CSW introduced self and explained role of CSW department. Per sister patient's daughter [name] [name] makes decisions for patient. Per sister patient's father [name] Mccurdy Sr. Is a resident at [facility]. Per sister patient does receive a disability check. CSW explained to sister that PT is recommending SNF and that under medicaid patient will have to 1) be willing to stay at a SNF for 30 days 3) sign over his disability check to the SNF 3) and be willing to go outside of [facility] due to the limited number of medicaid beds. Sister is  agreeable to SNF search and verbalized her understanding. Per sister [facility] would be a preference. CSW attempted to contact patient's daughter [name] [name] however it went straight to voicemail and message was left.   FL2 complete and PASARR is pending. PASARR went to a level 2 screen. PASARR screener [name] [name] [phone] stated that she would come to [facility] today after 4 pm to assess patient. RN and RN case manager aware of above.   CSW presented bed offer to sister (only bed offer is [facility]). Sister reported that she had a family member pass away at the [facility] and asked if there are any other facilities that would be available. CSW stated that a referral has been sent to the following counties: [place], [place], [place], [place], and [place] will continue to follow and assist as needed.   Employment status:  Disabled (Comment on whether or not currently receiving Disability) Insurance information:  Medicaid In [place] PT Recommendations:  [place] / Referral to community resources:  [place]  Patient/Family's Response to care:  Patient's sister is agreeable to [employer].   Patient/Family's Understanding of and Emotional Response to Diagnosis, Current Treatment, and Prognosis:  Patient's sister was very pleasant and thanked CSW for assistance.   Emotional Assessment Appearance:  Appears stated age Attitude/Demeanor/Rapport:  Unable to Assess Affect (typically observed):  Unable to Assess Orientation:  Oriented to Self, Fluctuating Orientation (  Suspected and/or reported Sundowners) Alcohol / Substance use:  Not Applicable Psych involvement (Current and /or in the community):  No (Comment)  Discharge Needs  Concerns to be addressed:  Discharge Planning Concerns Readmission within the last 30 days:  No Current discharge risk:  Dependent with Mobility Barriers to Discharge:  Continued  Medical Work up   [employer], [name] Beets, LCSW 11/13/2016, 1:35 PM

## 2016-11-14 LAB — BASIC METABOLIC PANEL
Anion gap: 6 (ref 5–15)
BUN: 36 mg/dL — AB (ref 6–20)
CALCIUM: 9.2 mg/dL (ref 8.9–10.3)
CHLORIDE: 116 mmol/L — AB (ref 101–111)
CO2: 24 mmol/L (ref 22–32)
CREATININE: 1.29 mg/dL — AB (ref 0.61–1.24)
GFR calc non Af Amer: >60 mL/min (ref >60)
Glucose, Bld: 148 mg/dL — ABNORMAL HIGH (ref 65–99)
Potassium: 3.5 mmol/L (ref 3.5–5.1)
Sodium: 146 mmol/L — ABNORMAL HIGH (ref 135–145)

## 2016-11-14 LAB — GLUCOSE, CAPILLARY
Glucose-Capillary: 154 mg/dL — ABNORMAL HIGH (ref 65–99)
Glucose-Capillary: 218 mg/dL — ABNORMAL HIGH (ref 65–99)
Glucose-Capillary: 175 mg/dL — ABNORMAL HIGH (ref 65–99)
Glucose-Capillary: 174 mg/dL — ABNORMAL HIGH (ref 65–99)

## 2016-11-14 LAB — CBC
HEMATOCRIT: 34.6 % — AB (ref 40.0–52.0)
HEMOGLOBIN: 12.1 g/dL — AB (ref 13.0–18.0)
MCH: 31.8 pg (ref 26.0–34.0)
MCHC: 34.9 g/dL (ref 32.0–36.0)
MCV: 91.1 fL (ref 80.0–100.0)
Platelets: 410 10*3/uL (ref 150–440)
RBC: 3.8 MIL/uL — AB (ref 4.40–5.90)
RDW: 14.7 % — ABNORMAL HIGH (ref 11.5–14.5)
WBC: 10 10*3/uL (ref 3.8–10.6)

## 2016-11-14 MED ORDER — METOPROLOL TARTRATE 25 MG PO TABS
25.0000 mg | ORAL_TABLET | Freq: Two times a day (BID) | ORAL | Status: DC
  Administered 2016-11-14 – 2016-11-15 (×3): 25 mg via ORAL
  Filled 2016-11-14 (×3): qty 1

## 2016-11-14 NOTE — Evaluation (Signed)
Occupational Therapy Evaluation Patient Details Name: [name] [name]. MRN: [id number] DOB: [birth year] Today's Date: 11/14/2016    History of Present Illness [name] [name] is a 56 y.o. male brought in for altered mental status. Rectal exam showed melena and was of course guaiac positive. Urine toxicology was negative. He is now admitted for upper GIB and acute encephalopathy secondary to CVA.    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 Pt was independent living alone prior to admission.  Pt presents with a flat affect and a delay in responses but cooperative. Dysarthria noted when attempting to speak. Pt has poor problem solving skills and sequencing skills during functional mobility with decreased safety awareness and impaired insight into deficits. Slightly increased motor planning and effort required to perform fine motor coordination tasks with R hand. Impaired balance with mobility. Nurse tech noted pt has not been notifying staff when he needs to get up. Pt generally min guard to min assist for LB ADL tasks due to safety/judgment and balance issues. Coordination grossly is intact but slow and requires extra time (greater time on R side than L). Patient is at a high risk for falls and would benefit from SNF for rehab.  Rec continued OT while in hospital to continue to work on increasing independence in ADLs, balance and functional mobility training, coordination exercises and family ed and training.    Follow Up Recommendations  SNF    Equipment Recommendations  3 in 1 bedside commode    Recommendations for Other Services       Precautions / Restrictions Precautions Precautions: Fall Restrictions Weight Bearing Restrictions: No      Mobility Bed Mobility               General bed mobility comments: deferred, pt EOB at start of session and in recliner for end of  session  Transfers Overall transfer level: Needs assistance Equipment used: Rolling walker (2 wheeled) Transfers: Sit to/from Stand Sit to Stand: Min guard;Min assist         General transfer comment: verbal cues for balance/safety    Balance Overall balance assessment: Needs assistance Sitting-balance support: No upper extremity supported Sitting balance-Leahy Scale: Fair     Standing balance support: Bilateral upper extremity supported Standing balance-Leahy Scale: Fair Standing balance comment: Mostly CGA but intermittent minA+1 to stabilize                           ADL either performed or assessed with clinical judgement   ADL Overall ADL's : Needs assistance/impaired Eating/Feeding: Sitting;Supervision/ safety   Grooming: Standing;Min guard;Wash/dry hands;Wash/dry face   Upper Body Bathing: Sitting;Set up;Supervision/ safety   Lower Body Bathing: Sitting/lateral leans;Set up;Min guard;Sit to/from stand;Cueing for safety   Upper Body Dressing : Sitting;Supervision/safety;Set up   Lower Body Dressing: Sitting/lateral leans;Sit to/from stand;Min guard;Cueing for safety Lower Body Dressing Details (indicate cue type and reason): pt able to sit in recliner and doff/don sock without physical assist. Min guard for dressing when in standing due to impaired balance. Toilet Transfer: Min guard;Cueing for safety;Ambulation;RW;Regular Teacher, adult education Details (indicate cue type and reason): min guard with verbal cues for balance/safety         Functional mobility during ADLs: Min guard;Minimal assistance;Rolling walker;Cueing for safety       Vision Baseline Vision/History: No visual deficits Patient Visual Report: No change from  baseline Vision Assessment?: No apparent visual deficits Additional Comments: no deficits noted through visual testing     Perception     Praxis      Pertinent Vitals/Pain Pain Assessment: No/denies pain     Hand  Dominance Right   Extremity/Trunk Assessment Upper Extremity Assessment Upper Extremity Assessment: Generalized weakness RUE Deficits / Details: grossly 4/5 bilaterally, intact sensation, mildly decreased coordination R worse than L with finger to nose, thumb opposition, and RAM testing.    Lower Extremity Assessment Lower Extremity Assessment: Generalized weakness;Defer to PT evaluation   Cervical / Trunk Assessment Cervical / Trunk Assessment: Normal   Communication Communication Communication: Expressive difficulties (disarthria)   Cognition Arousal/Alertness: Awake/alert Behavior During Therapy: Flat affect;Impulsive Overall Cognitive Status: Impaired/Different from baseline Area of Impairment: Following commands;Safety/judgement;Problem solving                 Orientation Level: Disoriented to;Time;Situation     Following Commands: Follows one step commands consistently;Follows multi-step commands inconsistently Safety/Judgement: Decreased awareness of deficits;Decreased awareness of safety   Problem Solving: Slow processing     General Comments       Exercises Other Exercises Other Exercises: pt educated in falls prevention strategies to support safety, pt verbalized understanding. Able to point to the red call button when asked. Call bell placed in lap and educated pt to call for help when he needed to get up again. Per nurse tech report, pt will not call for help and attempts to stand and go to the bathroom without assistance.   Shoulder Instructions      Home Living Family/patient expects to be discharged to:: Skilled nursing facility Living Arrangements: Alone   Type of Home: Apartment Home Access: Level entry     Home Layout: One level     Bathroom Shower/Tub: Chief Strategy Officer: Standard     Home Equipment: Cane - single point;Shower seat          Prior Functioning/Environment Level of Independence: Independent with  assistive device(s)        Comments: Pt able to verbalize independence with ADL, IADL, driving, and using a SPC. Denies falls. Enjoys singing gospel music.        OT Problem List: Decreased strength;Decreased cognition;Decreased activity tolerance;Decreased safety awareness;Decreased knowledge of use of DME or AE;Impaired balance (sitting and/or standing)      OT Treatment/Interventions: Therapeutic exercise;Therapeutic activities;Self-care/ADL training;Energy conservation;DME and/or AE instruction;Patient/family education;Neuromuscular education;Cognitive remediation/compensation;Balance training    OT Goals(Current goals can be found in the care plan section) Acute Rehab OT Goals Patient Stated Goal: Return to prior function OT Goal Formulation: With patient Time For Goal Achievement: 11/28/16 Potential to Achieve Goals: Good  OT Frequency: Min 1X/week   Barriers to D/C: Decreased caregiver support  unclear how much assist he may have       Co-evaluation              AM-PAC PT "6 Clicks" Daily Activity     Outcome Measure Help from another person eating meals?: A Little Help from another person taking care of personal grooming?: A Little Help from another person toileting, which includes using toliet, bedpan, or urinal?: A Little Help from another person bathing (including washing, rinsing, drying)?: A Little Help from another person to put on and taking off regular upper body clothing?: A Little Help from another person to put on and taking off regular lower body clothing?: A Little 6 Click Score: 18   End of Session  Activity Tolerance: Patient tolerated treatment well Patient left: in chair;with call bell/phone within reach;with chair alarm set  OT Visit Diagnosis: Other abnormalities of gait and mobility (R26.89);Muscle weakness (generalized) (M62.81);Other symptoms and signs involving cognitive function                Time: 7829-5621 OT Time Calculation  (min): 15 min Charges:  OT General Charges $OT Visit: 1 Visit OT Evaluation $OT Eval Low Complexity: 1 Low G-Codes: OT G-codes **NOT FOR INPATIENT CLASS** Functional Assessment Tool Used: AM-PAC 6 Clicks Daily Activity;Clinical judgement Functional Limitation: Self care Self Care Current Status (H0865): At least 40 percent but less than 60 percent impaired, limited or restricted Self Care Goal Status (H8469): At least 20 percent but less than 40 percent impaired, limited or restricted   Richrd Prime, MPH, MS, OTR/L ascom [phone] 11/14/16, 2:15 PM

## 2016-11-14 NOTE — Progress Notes (Signed)
Physical Therapy Treatment Patient Details Name: [name] [name]. MRN: [id number] DOB: [birth year] Today's Date: 11/14/2016    History of Present Illness Travus [name]  is a 56 y.o. male brought in for altered mental status. Patient's hemoglobin a couple days ago was 13.5 and today at 7.5. Rectal exam showed melena and was of course guaiac positive. Urine toxicology was negative. He is now admitted for upper GIB and acute encephalopathy secondary to CVA. Pt is unable to provide any information today so all history obtained from medical record. Attempted to contact 2 separate family members on the telephone without any answer.     PT Comments    Pt demonstrates continued improvement with bed mobility, tranfers, and ambulation on this date. He is able to perform is to stand without UE support but unable to complete full 5TSTS test due to weakness with reps 4 and 5. He demonstrates balance impairment with narrow and modified tandem stance. Pt is able to complete a full lap around RN station with therapist. VSS throughout ambulation with HR increasing to around 100 bpm but no DOE. SaO2>95% on room air. Pt occasionally crosses midline with stepping and repeatedly drifts to the left with his walker. He requires frequent redirection for ambulation. Pt has difficulty talking or following commands and walking at the same time. He is able to perform horizontal and vertical head turns however with notable slowing of gait. Pt will benefit from PT services to address deficits in strength, balance, and mobility in order to return to full function at home.    Follow Up Recommendations  SNF     Equipment Recommendations  Other (comment) (TBD at next venue)    Recommendations for Other Services OT consult     Precautions / Restrictions Precautions Precautions: Fall Restrictions Weight Bearing Restrictions: No    Mobility  Bed Mobility Overal bed mobility: Modified Independent              General bed mobility comments: HOB elevated and use of bed rails. Improving speed and sequencing today. No deficits identified  Transfers Overall transfer level: Needs assistance Equipment used: Rolling walker (2 wheeled) Transfers: Sit to/from Stand Sit to Stand: Min assist         General transfer comment: Pt requires minA+1 to stabilize with sit to stand transfers. Able to transer without UE support but with increasing imbalance in standing. Cues for safe hand placement during descent. Pt is stable with UE support on rolling walker and once standing only requires CGA from therapist  Ambulation/Gait Ambulation/Gait assistance: Min assist Ambulation Distance (Feet): 180 Feet Assistive device: Rolling walker (2 wheeled) Gait Pattern/deviations: Decreased step length - right;Decreased step length - left;Narrow base of support Gait velocity: Decreased Gait velocity interpretation: <1.8 ft/sec, indicative of risk for recurrent falls General Gait Details: Pt is able to complete a full lap around RN station with therapist. VSS throughout ambulation with HR increasing to around 100 bpm but no DOE. SaO2>95% on room air. Pt occasionally crosses midline with stepping and repeatedly drifts to the left with his walker. He requires frequent redirection for ambulation. Pt has difficulty talking or following commands and walking at the same time. He is able to perform horizontal and vertical head turns however with notable slowing of gait   Stairs            Wheelchair Mobility    Modified Rankin (Stroke Patients Only)       Balance Overall balance assessment: Needs assistance Sitting-balance support: No  upper extremity supported Sitting balance-Leahy Scale: Fair     Standing balance support: Bilateral upper extremity supported Standing balance-Leahy Scale: Fair Standing balance comment: Mostly CGA but intermittent minA+1 to stabilize                             Cognition Arousal/Alertness: Awake/alert Behavior During Therapy: Flat affect Overall Cognitive Status: Impaired/Different from baseline Area of Impairment: Orientation                 Orientation Level: Disoriented to;Time;Situation (At baseline family reports pt is fully A&O)                    Exercises General Exercises - Lower Extremity Long Arc Quad: Strengthening;Both;Seated;15 reps Heel Slides: Strengthening;Both;Seated;15 reps Hip ABduction/ADduction: Strengthening;Both;Seated;15 reps Straight Leg Raises: Strengthening;Both;Supine;15 reps Hip Flexion/Marching: Strengthening;Both;Seated;15 reps Heel Raises: Strengthening;Both;Seated;15 reps Other Exercises Other Exercises: Sit to stand x 5 without UE support, standing balance training with feet together and in modified tandem    General Comments        Pertinent Vitals/Pain Pain Assessment: Faces Faces Pain Scale: No hurt    Home Living     Available Help at Discharge: Other (Comment) (Per review of PT note, no help available) Type of Home: Apartment              Prior Function            PT Goals (current goals can now be found in the care plan section) Acute Rehab PT Goals Patient Stated Goal: Return to prior function PT Goal Formulation: With family Progress towards PT goals: Progressing toward goals    Frequency    7X/week      PT Plan Discharge plan needs to be updated    Co-evaluation              AM-PAC PT "6 Clicks" Daily Activity  Outcome Measure  Difficulty turning over in bed (including adjusting bedclothes, sheets and blankets)?: None Difficulty moving from lying on back to sitting on the side of the bed? : None Difficulty sitting down on and standing up from a chair with arms (e.g., wheelchair, bedside commode, etc,.)?: None Help needed moving to and from a bed to chair (including a wheelchair)?: A Little Help needed walking in hospital room?: A Little Help  needed climbing 3-5 steps with a railing? : A Lot 6 Click Score: 20    End of Session Equipment Utilized During Treatment: Gait belt Activity Tolerance: Patient tolerated treatment well Patient left: with call bell/phone within reach;in chair;with chair alarm set;Other (comment) (tray over lap) Nurse Communication: Mobility status;Other (comment) (Pt up in chair) PT Visit Diagnosis: Unsteadiness on feet (R26.81);Muscle weakness (generalized) (M62.81);Difficulty in walking, not elsewhere classified (R26.2)     Time: 1610-9604 PT Time Calculation (min) (ACUTE ONLY): 26 min  Charges:  $Gait Training: 8-22 mins $Therapeutic Exercise: 8-22 mins                    G Codes:       Sharalyn Ink Jep Dyas PT, DPT     Annahi Short 11/14/2016, 11:12 AM

## 2016-11-14 NOTE — Progress Notes (Signed)
SNF level PASARR has been received, [id number] F expires on 01/13/2017.   [employer], LCSW [phone]

## 2016-11-14 NOTE — Progress Notes (Signed)
Pt no longer requiring telesitter. Pt sitting up in chair with chair alarm on. Will continue to monitor. Otilio Jefferson, RN

## 2016-11-14 NOTE — Progress Notes (Signed)
Plan is for patient to D/C to [facility] tomorrow if he can remain without a tele-sitter for 24 hours. Doug admissions coordinator at [facility] is aware of above.   [employer], LCSW [phone]

## 2016-11-14 NOTE — Evaluation (Signed)
Speech Language Pathology Evaluation and Dysphagia treatment Patient Details Name: [name] [name]. MRN: [id number] DOB: 01/19/1961 Today's Date: 11/14/2016 Time: 4098-1191 SLP Time Calculation (min) (ACUTE ONLY): 40 min  Problem List:  Patient Active Problem List   Diagnosis Date Noted  . GI bleed 11/07/2016  . Accelerated hypertension 06/07/2016  . GERD (gastroesophageal reflux disease) 06/07/2016  . Bipolar affective disorder (HCC) 06/07/2016  . Chest pain 06/07/2016  . Unspecified Depressive disorder 04/17/2016  . 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 Tobacco use disorder 11/07/2014  . Stimulant use disorder (HCC) crack cocaine 11/07/2014  . Alcohol use disorder, moderate, dependence (HCC) 11/07/2014  . CAD (coronary artery disease) 11/05/2014  . Cerebral infarction (HCC) 10/19/2014  . LVH (left ventricular hypertrophy) 05/20/2013  . Obesity (BMI 30-39.9) 04/10/2013  . Aortic stenosis, mild 03/07/2013  . DM (diabetes mellitus), type 2, uncontrolled (HCC) 01/05/2013  . Essential hypertension, malignant 01/25/2009   Past Medical History:  Past Medical History:  Diagnosis Date  . Aortic stenosis, mild February 2015  . Auditory hallucinations   . Bipolar disorder (HCC)   . Coronary artery disease    No reported catheterization. Negative Myoview stress test and 2013.  Marland Kitchen Depression   . Diabetes mellitus   . GERD (gastroesophageal reflux disease)   . History of suicide attempt   . Hx of medication noncompliance   . Hypertension   . Hypertensive hypertrophic cardiomyopathy [facility]) February 2015   Severe concentric LVH on echocardiogram.  . Obesity, Class II, BMI 35-39.9, with comorbidity   . Pericarditis   . Polysubstance abuse (HCC)    cocaine, marijuana  . Stroke [facility]) February 2015   Right MCA aneurysm; head MRI - acute infarct of the right frontal lobe. Smaller acute infarct of the left frontal lobe. Thought to be due to cerebral  emboli. Left PICA infarct. Small chronic lacunar infarct right internal capsule. Chronic microvascular ischemic change with white matter change.  . Syncope and collapse    Past Surgical History:  Past Surgical History:  Procedure Laterality Date  . Carotid Dopplers:  03/23/2013   Interval thickening of bilateral common carotid no significant plaque formation. Tortuosity but no significant stenosis. Patent bilateral vertebral  arteries  . NM MYOVIEW LTD  July 2013   No ischemia or infarct. There is inferior wall defec suggestive of diaphragmatic attenuation. Mild LV dilation at to be due to hypertensive heart disease. EF 55%. Poor exercise tolerance.  Marland Kitchen PERICARDIAL FLUID DRAINAGE    . TRANSTHORACIC ECHOCARDIOGRAM  03/23/2013   EF 5560%. Elevated left atrial pressure. Severe concentric LVH. Mild aortic stenosis. -- Appearance of hypertensive hypertrophic heart disease.  No clear cardioembolic source   HPI:  Pt is a 56 y.o male brought in from NH(?) but some reports are that he lives at home d/t for altered mental status. Pt has multiple medical issues including CAD, DM, HTN, GERD, obesity, Bipolar Dis., polysubstance abuse, CVAs w/ multiple infarcts, depression. Unable to get any good history from the patient. He does answer a few yes or no questions.  Pt found to have Upper GI bleed with melena and Acute encephalopathy due to acute CVA-MRI with bilateral watershed CVA. Pt awake and alert, sitting upright in bed when approached.  Pt answered SLP questions appropriately and engage in basic conversation.  He was able to reposition self in bed prior to PO trials.  Pt inquired about possible improvement of stroke deficits (dysarthria + apraxia).  Pt education of period of spontaneous  recovery and neuroplasticity completed.   Assessment / Plan / Recommendation Clinical Impression  SPEECH/LANG: Pt is a 56 y/o male seen s/p bilateral watershed CVA seen at bedside for a speech/language re-evaluation d/t  significant improvement in status.  Pt presents with intact expressive language abilities, but with mild-moderate cognitive-linguistic deficits.  Pt's scores were as follows: confrontational naming: 6/6, Automatic speech 3/3; 1-step directions: 5/5; 2-step directions: 2/5; digit span forward: 7/8.  Deficits noted in working memory, complex instructions, and processing speech.  He benefits from repetition and extra processing time.  Of note, pt required cues to self-correct 2 step directions - indicating reduced insight to deficits. A&O x3/4 (-year). 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  Direct instruction of S.L.O.P. strategies (slow down, loud, over articulate, pause) completed with pt to which he expressed and demonstrated understanding too.  With use of strategies, intelligibility increased significantly.  Pt appears motivated, inquiring "is this because the stroke?" and "Will my speech get better".  SLP will follow during acute stay for continued motor speech and cognitive-linguistic treatment as well as dysphagia management.   DYSPHAGIA tx: Pt seen at bedside for diet tolerance analysis and potential to upgrade diet.  Pt (+) significantly improved LOA this date.  Pt tolerated x10+ trials of thin liquid (water) via straw, including in isolation and as a liquid wash.  Pt tolerated both single and subsequent sips without any overt s/sx of aspiration.  No coughing, throat clearing, or change in vocal quality observed.  Pt occasionally presents with mouth holding of water, and benefits from verbal cues "1,2,3 swallow" - likely c/b suspected apraxia.  Pt completed x4 trials of puree with good oral clearance.  Pt reported that he usually wears dentures when masticating hard crunchy foods, however his dentures are not present.  He reported does eat soft solids without dentures.  Mech soft trialed by softening graham cracker in applesauce x4.  Pt tolerated such with  anterior chewing (using remaining front teeth) without any oral residue, however suspect pharyngeal residue d/t vocal quality.  Liquid wash effective at clearing such.  Recommend upgrade diet to dysphagia 2 with thin liquids and cues to utilize swallow strategies: small bites/sips, alternate solids and liquids, "1,2,3 swallow".  SLP to follow to advance diet as appropriate.     SLP Assessment  SLP Recommendation/Assessment: Patient needs continued Speech Lanaguage Pathology Services SLP Visit Diagnosis: Dysphagia, oropharyngeal phase (R13.12);Cognitive communication deficit (R41.841);Apraxia (R48.2);Dysarthria and anarthria (R47.1)    Follow Up Recommendations  Skilled Nursing facility    Frequency and Duration min 3x week  2 weeks      SLP Evaluation Cognition  Overall Cognitive Status: Impaired/Different from baseline Arousal/Alertness: Awake/alert Orientation Level: Oriented to person;Oriented to place;Oriented to situation;Disoriented to time Attention: Sustained Sustained Attention: Appears intact Memory: Impaired Memory Impairment: Decreased recall of new information Awareness: Appears intact Problem Solving: Impaired Problem Solving Impairment: Verbal complex Executive Function: Self Correcting Self Correcting: Impaired Self Correcting Impairment: Verbal complex;Functional complex       Comprehension  Auditory Comprehension Overall Auditory Comprehension: Impaired Yes/No Questions: Within Functional Limits Commands: Impaired One Step Basic Commands: 75-100% accurate Two Step Basic Commands: 25-49% accurate Conversation: Simple Interfering Components: Processing speed;Working Radio broadcast assistant: Wellsite geologist: Not tested Reading Comprehension Reading Status: Not tested    Expression Expression Primary Mode of Expression: Verbal Verbal Expression Overall Verbal  Expression: Appears within functional limits for tasks assessed Initiation: No impairment Automatic Speech: Name;Counting;Day of week Level  of Generative/Spontaneous Verbalization: Phrase Repetition: No impairment Naming: No impairment Pragmatics: No impairment Non-Verbal Means of Communication: Not applicable Written Expression Written Expression: Not tested   Oral / Motor  Oral Motor/Sensory Function Overall Oral Motor/Sensory Function: Moderate impairment Facial ROM: Reduced right Facial Symmetry: Abnormal symmetry right Facial Strength: Reduced left;Reduced right;Other (Comment) Lingual ROM: Reduced right;Reduced left;Other (Comment) Lingual Symmetry: Abnormal symmetry right Lingual Strength: Reduced Velum: Within Functional Limits Mandible: Within Functional Limits Motor Speech Overall Motor Speech: Impaired (Pt reportedly presents with some dysarthria at baseling, but is not worse) Respiration: Within functional limits Phonation: Normal Resonance: Within functional limits Articulation: Within functional limitis Intelligibility: Intelligibility reduced Word: 75-100% accurate Phrase: 50-74% accurate Sentence: 25-49% accurate Motor Planning: Impaired Level of Impairment: Phrase Motor Speech Errors: Aware Interfering Components: Inadequate dentition;Premorbid status Effective Techniques: Slow rate;Over-articulate;Increased vocal intensity;Pause   GO                    Blong Busk 11/14/2016, 9:34 AM

## 2016-11-14 NOTE — Progress Notes (Signed)
[facility] - [place] at [facility]   PATIENT NAME: [name] [name]    MR#:  [id number]  DATE OF BIRTH:  [birth year]  SUBJECTIVE:  CHIEF COMPLAINT:   Chief Complaint  Patient presents with  . Altered Mental Status   -known history of bipolar disorder, schizophrenia. Admitted with altered mental status and noted to have acute infarcts. -Men's alert, has dysarthria at baseline. Telemetry sitter has been discontinued today  REVIEW OF SYSTEMS:  Review of Systems  Unable to perform ROS: Medical condition    DRUG ALLERGIES:  No Known Allergies  VITALS:  Blood pressure (!) 132/99, pulse 90, temperature 98.2 F (36.8 C), resp. rate 20, height  (1.778 m), weight 102.1 kg (225 lb), SpO2 100 %.  PHYSICAL EXAMINATION:  Physical Exam  GENERAL:  56 y.o.-year-old obese patient sitting in the bed with no acute distress.  EYES: Pupils equal, round, reactive to light and accommodation. No scleral icterus. Extraocular muscles intact.  HEENT: Head atraumatic, normocephalic. Oropharynx and nasopharynx clear. Right facial droop slightly noted. NECK:  Supple, no jugular venous distention. No thyroid enlargement, no tenderness.  LUNGS: Normal breath sounds bilaterally, no wheezing, rales,rhonchi or crepitation. No use of accessory muscles of respiration. Decreased bibasilar breath sounds CARDIOVASCULAR: S1, S2 normal. No murmurs, rubs, or gallops.  ABDOMEN: Soft, nontender, nondistended. Bowel sounds present. No organomegaly or mass.  EXTREMITIES: No pedal edema, cyanosis, or clubbing.  NEUROLOGIC: Mild right facial droop, otherwise cranial nerves seems to be intact. Dysarthria noted. Strength in both upper extremities is 5/5 in both lower extremities is 4/5 with more giveaway weakness on the right side. -Sensation is intact. Gait is not checked  PSYCHIATRIC: The patient is alert and oriented to self.  SKIN: No obvious rash, lesion, or ulcer.    LABORATORY PANEL:    CBC  Recent Labs Lab 11/14/16 0458  WBC 10.0  HGB 12.1*  HCT 34.6*  PLT 410   ------------------------------------------------------------------------------------------------------------------  Chemistries   Recent Labs Lab 11/14/16 0458  NA 146*  K 3.5  CL 116*  CO2 24  GLUCOSE 148*  BUN 36*  CREATININE 1.29*  CALCIUM 9.2   ------------------------------------------------------------------------------------------------------------------  Cardiac Enzymes No results for input(s): TROPONINI in the last 168 hours. ------------------------------------------------------------------------------------------------------------------  RADIOLOGY:  No results found.  EKG:   Orders placed or performed during the hospital encounter of 11/07/16  . ED EKG  . ED EKG    ASSESSMENT AND PLAN:   56 year old male with past medical history scant for bipolar disorder, sleep apnea, LVH, polysubstance abuse, schizophrenia, diabetes and hypertension presents to the hospital secondary to altered mental status.  #1 metabolic encephalopathy-secondary to underlying acute stroke -MRI with watershed infarcts, likely triggered by hypotension from hemorrhagic shock and admission. -Prior history of CVA with dysarthria at baseline. -Appreciate neurology consult. Okay to restart aspirin and Plavix per GI at this time. -Continue statin. -Physical therapy recommended rehabilitation  #2 acute anemia-secondary to upper GI bleed on presentation. Drop in hemoin noted to be at 7.5 on admission -Received third of 3 units packed RBC transfusion and hemoglobin is improved. -Appreciate GI consult. Outpatient EGD recommended. On aspirin and Plavix. -On Protonix  #3 dysphagia-secondary to acute CVA -Currently on a dysphagia diet. High risk for aspiration -Family agreeable for PEG tube if needed  #4 bipolar with schizophrenia-due to excessive sedation, some of the medications are on hold. Continue  trazodone -when necessary Haldol for behavioral changes. Off telemetry sitter today  #5 hypertension-relatively low blood pressure on  admission.  -Home BP meds are on hold. We will restart metoprolol at this time. If still high, add norvasc  #6 DVT Prophylaxis- TEDs and SCDs   Off sitter today, likely discharge to rehabilitation tomorrow    All the records are reviewed and case discussed with Care Management/Social Workerr. Management plans discussed with the patient, family and they are in agreement.  CODE STATUS: Full code  TOTAL TIME TAKING CARE OF THIS PATIENT: 37 minutes.   POSSIBLE D/C TOMORROW, DEPENDING ON CLINICAL CONDITION.   [name] [name] M.D on 11/14/2016 at 1:45 PM  Between 7am to 6pm - Pager - [phone]  After 6pm go to [email or web address] - Social research officer, government  [facility] [place] [facility]  Office  [phone]  CC: Primary care physician; Preston Fleeting, MD

## 2016-11-15 LAB — GLUCOSE, CAPILLARY
GLUCOSE-CAPILLARY: 133 mg/dL — AB (ref 65–99)
GLUCOSE-CAPILLARY: 181 mg/dL — AB (ref 65–99)

## 2016-11-15 MED ORDER — INSULIN ASPART 100 UNIT/ML ~~LOC~~ SOLN: 0.0000 [IU] | Freq: Every day | SUBCUTANEOUS | 11 refills | Status: AC

## 2016-11-15 MED ORDER — THIAMINE HCL 100 MG PO TABS: 100.0000 mg | ORAL_TABLET | Freq: Every day | ORAL | 0 refills | Status: AC

## 2016-11-15 MED ORDER — METOPROLOL TARTRATE 25 MG PO TABS: 25.0000 mg | ORAL_TABLET | Freq: Two times a day (BID) | ORAL | 0 refills | Status: AC

## 2016-11-15 MED ORDER — INSULIN ASPART 100 UNIT/ML ~~LOC~~ SOLN: 0.0000 [IU] | Freq: Three times a day (TID) | SUBCUTANEOUS | 11 refills | Status: AC

## 2016-11-15 MED ORDER — PANTOPRAZOLE SODIUM 40 MG PO TBEC: 40.0000 mg | DELAYED_RELEASE_TABLET | Freq: Two times a day (BID) | ORAL | 0 refills | Status: AC

## 2016-11-15 NOTE — Clinical Social Work Note (Addendum)
Patient to be d/c'ed today to [facility].  Patient and family agreeable to plans will transport via sister's car RN to call report [phone].  Windell Moulding, MSW, Theresia Majors [phone]

## 2016-11-15 NOTE — Progress Notes (Signed)
Report called to Narjette ([phone]) at [facility]. Pt going to room 5. Pt daughter Delaney Meigs to transport.

## 2016-11-15 NOTE — Discharge Summary (Signed)
[name] Mazin Emma., is a 56 y.o. male  DOB November 07, 1960  MRN [id number].  Admission date:  11/07/2016  Admitting Physician  Alford Highland, MD  Discharge Date:  11/15/2016   Primary MD  Revelo, Presley Raddle, MD  Recommendations for primary care physician for things to follow:  Follow-up with  PCP in 1 week    Admission Diagnosis  Melena [K92.1] Slurred speech [R47.81] Acute renal failure, unspecified acute renal failure type (HCC) [N17.9] Altered mental status, unspecified altered mental status type [R41.82] Acute anemia [D64.9]   Discharge Diagnosis  Melena [K92.1] Slurred speech [R47.81] Acute renal failure, unspecified acute renal failure type (HCC) [N17.9] Altered mental status, unspecified altered mental status type [R41.82] Acute anemia [D64.9]   Active Problems:   GI bleed      Past Medical History:  Diagnosis Date  . Aortic stenosis, mild February 2015  . Auditory hallucinations   . Bipolar disorder (HCC)   . Coronary artery disease    No reported catheterization. Negative Myoview stress test and 2013.  Marland Kitchen Depression   . Diabetes mellitus   . GERD (gastroesophageal reflux disease)   . History of suicide attempt   . Hx of medication noncompliance   . Hypertension   . Hypertensive hypertrophic cardiomyopathy [facility]) February 2015   Severe concentric LVH on echocardiogram.  . Obesity, Class II, BMI 35-39.9, with comorbidity   . Pericarditis   . Polysubstance abuse (HCC)    cocaine, marijuana  . Stroke [facility]) February 2015   Right MCA aneurysm; head MRI - acute infarct of the right frontal lobe. Smaller acute infarct of the left frontal lobe. Thought to be due to cerebral emboli. Left PICA infarct. Small chronic lacunar infarct right internal capsule. Chronic microvascular ischemic change with white  matter change.  . Syncope and collapse     Past Surgical History:  Procedure Laterality Date  . Carotid Dopplers:  03/23/2013   Interval thickening of bilateral common carotid no significant plaque formation. Tortuosity but no significant stenosis. Patent bilateral vertebral  arteries  . NM MYOVIEW LTD  July 2013   No ischemia or infarct. There is inferior wall defec suggestive of diaphragmatic attenuation. Mild LV dilation at to be due to hypertensive heart disease. EF 55%. Poor exercise tolerance.  Marland Kitchen PERICARDIAL FLUID DRAINAGE    . TRANSTHORACIC ECHOCARDIOGRAM  03/23/2013   EF 5560%. Elevated left atrial pressure. Severe concentric LVH. Mild aortic stenosis. -- Appearance of hypertensive hypertrophic heart disease.  No clear cardioembolic source       History of present illness and  Hospital Course:     Kindly see H&P for history of present illness and admission details, please review complete Labs, Consult reports and Test reports for all details in brief  HPI  from the history and physical done on the day of admission 56 year old male admitted for altered mental status, found to have hemoglobin dropped from 13.5-7.5. The patient had melena, quite positive stool. So he is admitted for upper GI bleed, encephalopathy. Patient had MRI of the brain which showed watershed infarcts.   Hospital Course  #1 .metabolic encephalopathy secondary to acute stroke: MRI of the brain showed watershed infarcts regular by hypotension from hemorrhagic shock on admission. Patient had a prior CVA 38s. Baseline.Seen by neurology. Physical therapy recommended rehabilitation, patient initially had agitation and has to be on telemetry sitter. The patient is off telemetry sitter for 24 hours. Workup included ultrasound carotids showed bilateral plaques but no hemodynamically significant stenosis.echocardiogram showed EF  65% with normal wall motion abnormalities, no cardiac source of emboli. atient's LDL is 50.  He'll be on aspirin, Plavix at discharge.  Patient is on dysphagia 1 diet, he s tolerating it well. 2.acute anemia secondary to upper GI bleed on presentation: Patient received 3 units of packed RBC transfusion, hemoglobin improved from 7.5-12.1.. Gastroenterologist  Approved aspirin, Plavix  For stroke.  3.essential hypertension: Relatively low blood pressure admission: Initially home blood pressure medicines on hold, restarted metoprolol.   #4. history of bipolar disorder,Agitation during the hospital stay, received Ativan in and had  Telesitter. But from last 24 hrs he did not  Require any Telesitter.  #5 diabetes mellitus type 2: Because of poor by mouth intake he is on sliding scale insulin with coverage only. Hold the metformin and restart when patient's by mouth intake is better.     Discharge Condition: stable   Follow UP  Contact information for after-discharge care    Destination    [facility] .   Specialty:  Skilled Nursing Facility Contact information: [address] [place] [address] [phone]                Discharge Instructions  and  Discharge Medications     Allergies as of 11/15/2016   No Known Allergies     Medication List    STOP taking these medications   insulin aspart 100 UNIT/ML FlexPen Commonly known as:  NOVOLOG Replaced by:  insulin aspart 100 UNIT/ML injection   metFORMIN 500 MG tablet Commonly known as:  GLUCOPHAGE   naproxen 500 MG tablet Commonly known as:  NAPROSYN     TAKE these medications   amLODipine 10 MG tablet Commonly known as:  NORVASC Take 1 tablet (10 mg total) by mouth daily.   aspirin 81 MG EC tablet Take 1 tablet (81 mg total) by mouth daily.   atorvastatin 20 MG tablet Commonly known as:  LIPITOR Take 1 tablet (20 mg total) by mouth daily at 6 PM.   chlorthalidone 25 MG tablet Commonly known as:  HYGROTON Take 1 tablet (25 mg total) by mouth daily.    clopidogrel 75 MG tablet Commonly known as:  PLAVIX Take 1 tablet (75 mg total) by mouth daily.   insulin aspart 100 UNIT/ML injection Commonly known as:  novoLOG Inject 0-9 Units into the skin 3 (three) times daily with meals. Replaces:  insulin aspart 100 UNIT/ML FlexPen   insulin aspart 100 UNIT/ML injection Commonly known as:  novoLOG Inject 0-5 Units into the skin at bedtime.   metoprolol tartrate 25 MG tablet Commonly known as:  LOPRESSOR Take 1 tablet (25 mg total) by mouth 2 (two) times daily.   nitroGLYCERIN 0.4 MG SL tablet Commonly known as:  NITROSTAT Place 1 tablet (0.4 mg total) under the tongue every 5 (five) minutes as needed for chest pain.   pantoprazole 40 MG tablet Commonly known as:  PROTONIX Take 1 tablet (40 mg total) by mouth 2 (two) times daily before a meal.   thiamine 100 MG tablet Take 1 tablet (100 mg total) by mouth daily.   traZODone 150 MG tablet Commonly known as:  DESYREL Take 1 tablet (150 mg total) by mouth at bedtime.         Diet and Activity recommendation: See Discharge Instructions above   Consults obtained -neurology, physical therapy   Major procedures and Radiology Reports - PLEASE review detailed and final reports for all details, in brief -  Dg Chest 2 View  Result Date: 11/04/2016 CLINICAL DATA:  Chest pain. EXAM: CHEST  2 VIEW COMPARISON:  September 24, 2016. FINDINGS: The cardiomediastinal silhouette is normal in size. Tortuous thoracic aorta. Normal pulmonary vascularity. No focal consolidation, pleural effusion, or pneumothorax. No acute osseous abnormality. IMPRESSION: No active cardiopulmonary disease. Electronically Signed   By: Obie Dredge M.D.   On: 11/04/2016 13:56   Ct Head Wo Contrast  Result Date: 11/09/2016 CLINICAL DATA:  Acute presentation for altered mental status with confusion. EXAM: CT HEAD WITHOUT CONTRAST TECHNIQUE: Contiguous axial images were obtained from the base of the skull through  the vertex without intravenous contrast. COMPARISON:  CT and MRI 11/07/2016 FINDINGS: Brain: Chronic generalized atrophy. Old left cerebellar infarction. Chronic small-vessel ischemic changes of the pons. Chronic small-vessel ischemic changes of the hemispheric white matter. Old right frontoparietal cortical and subcortical infarction. Small foci of deep brain infarction bilaterally left more than right most consistent with watershed infarction is not specifically discernible on CT against the background of chronic changes. No evidence of hemorrhage or mass effect. No hydrocephalus or extra-axial collection. Vascular: There is atherosclerotic calcification of the major vessels at the base of the brain. 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 The small acute infarctions seen affecting both hemispheres in a watershed pattern left more than right are not discernible on the CT against the background of chronic changes. No evidence of hemorrhage or mass effect. Electronically Signed   By: Paulina Fusi M.D.   On: 11/09/2016 14:32   Ct Head Wo Contrast  Result Date: 11/07/2016 CLINICAL DATA:  Facial droop and slurred speech EXAM: CT HEAD WITHOUT CONTRAST TECHNIQUE: Contiguous axial images were obtained from the base of the skull through the vertex without intravenous contrast. COMPARISON:  06/07/2016 FINDINGS: Brain: There are changes consistent with prior left cerebellar infarct. These changes are stable from the prior exam. No acute hemorrhage, acute infarction or space-occupying mass lesion is noted. Scattered chronic white matter ischemic change is noted. Vascular: No hyperdense vessel or unexpected calcification. Skull: Normal. Negative for fracture or focal lesion. Sinuses/Orbits: No acute finding. Other: Old nasal bone fractures are noted. IMPRESSION:  Changes consistent with remote left cerebellar infarct. Chronic ischemic changes are seen. No acute abnormality is noted. Electronically Signed   By: Alcide Clever M.D.   On: 11/07/2016 13:52   Mr Brain Wo Contrast  Result Date: 11/07/2016 CLINICAL DATA:  Altered mental status. Upper GI bleed with melena, acute hemorrhagic anemia. EXAM: MRI HEAD WITHOUT CONTRAST TECHNIQUE: Multiplanar, multiecho pulse sequences of the brain and surrounding structures were obtained without intravenous contrast. The patient could only complete a portion of the exam. COMPARISON:  CT earlier today.  MR head 03/10/2016. FINDINGS: Brain: Multifocal areas of restricted diffusion, corresponding low ADC, affecting the LEFT greater than RIGHT cerebral hemispheres, predominantly the subcortical and periventricular deep white matter. The pattern of infarction is fairly linear, from front to back, lying between the vascular territories of the anterior/middle and middle/ posterior cerebral arteries. There is some involvement of the LEFT lentiform nucleus, possibly the LEFT caudate. No brainstem or cerebellar involvement. No definite acute or chronic hemorrhage. Findings are most consistent with acute watershed type infarction. Multifocal emboli are less favored, particularly given the lack of cortical involvement. There is advanced atrophy with chronic microvascular ischemic change. There is an old LEFT cerebellar infarct. There may be an  old RIGHT frontal infarct. Vascular: Unable to assess due to motion. Skull and upper cervical spine: Unable to assess. Sinuses/Orbits: Negative. Other: None. IMPRESSION: Motion degraded exam which is diagnostic but prematurely truncated. Multifocal areas of acute nonhemorrhagic infarction, LEFT greater than RIGHT, primarily affecting the deep white matter, supratentorial in location, which are most consistent with a watershed pattern of injury, possibly due to hypotension or cardiac arrhythmia. See discussion  above. Advanced atrophy with chronic microvascular ischemic change. Evidence for remote cerebral infarction. Electronically Signed   By: Elsie Stain M.D.   On: 11/07/2016 21:42   US Carotid Bilateral  Result Date: 11/09/2016 CLINICAL DATA:  56 year old male with multifocal bilateral cerebral infarcts. EXAM: BILATERAL CAROTID DUPLEX ULTRASOUND TECHNIQUE: Wallace Cullens scale imaging, color Doppler and duplex ultrasound were performed of bilateral carotid and vertebral arteries in the neck. COMPARISON:  Brain MRI 11/07/2016 ; prior carotid duplex ultrasound 10/19/2014 FINDINGS: Criteria: Quantification of carotid stenosis is based on velocity parameters that correlate the residual internal carotid diameter with NASCET-based stenosis levels, using the diameter of the distal internal carotid lumen as the denominator for stenosis measurement. The following velocity measurements were obtained: RIGHT ICA:  58/17 cm/sec CCA:  69/10 cm/sec SYSTOLIC ICA/CCA RATIO:  0.8 DIASTOLIC ICA/CCA RATIO:  1.7 ECA:  135 cm/sec LEFT ICA:  54/30 cm/sec CCA:  95/19 cm/sec SYSTOLIC ICA/CCA RATIO:  0.6 DIASTOLIC ICA/CCA RATIO:  1.6 ECA:  87 cm/sec RIGHT CAROTID ARTERY: Trace heterogeneous atherosclerotic plaque. By peak systolic velocity criteria, the estimated stenosis remains less than 15%. RIGHT VERTEBRAL ARTERY:  Patent with normal antegrade flow. LEFT CAROTID ARTERY: Mild smooth heterogeneous atherosclerotic plaque. By peak systolic velocity criteria, the estimated stenosis remains less than 50%. LEFT VERTEBRAL ARTERY:  Patent with normal antegrade flow. IMPRESSION: 1. Mild (1-49%) stenosis proximal right internal carotid artery secondary to trace heterogeneous atherosclerotic plaque without significant interval progression compared to September of 2016. 2. Mild (1-49%) stenosis proximal left internal carotid artery secondary to smooth heterogeneous atherosclerotic plaque without significant interval progression compared to September of  2016. 3. Vertebral arteries are patent with normal antegrade flow. Signed, [place] Big, MD [facility] Radiology Electronically Signed   By: Malachy Moan M.D.   On: 11/09/2016 15:01    Micro Results    Recent Results (from the past 240 hour(s))  MRSA PCR Screening     Status: None   Collection Time: 11/07/16  5:30 PM  Result Value Ref Range Status   MRSA by PCR NEGATIVE NEGATIVE Final    Comment:        The GeneXpert MRSA Assay (FDA approved for NASAL specimens only), is one component of a comprehensive MRSA colonization surveillance program. It is not intended to diagnose MRSA infection nor to guide or monitor treatment for MRSA infections.        Today   Subjective:   [name] [name] today has no headache,no chest abdominal pain,no new weakness tingling or numbness, patient is stable for discharge to rehabilitation today. He is going to [employer].  Objective:   Blood pressure (!) 150/82, pulse 89, temperature 98 F (36.7 C), temperature source Oral, resp. rate 20, height  (1.778 m), weight 102.1 kg (225 lb), SpO2 97 %.   Intake/Output Summary (Last 24 hours) at 11/15/16 1013 Last data filed at 11/14/16 1400  Gross per 24 hour  Intake              240 ml  Output  0 ml  Net              240 ml    Exam Awake Alert, Oriented x 3, No new F.N deficits, Normal affect [place].AT,PERRAL Supple Neck,No JVD, No cervical lymphadenopathy appriciated.  Symmetrical Chest wall movement, Good air movement bilaterally, CTAB RRR,No Gallops,Rubs or new Murmurs, No Parasternal Heave +ve B.Sounds, Abd Soft, Non tender, No organomegaly appriciated, No rebound -guarding or rigidity. No Cyanosis, Clubbing or edema, No new Rash or bruise  Data Review   CBC w Diff: Lab Results  Component Value Date   WBC 10.0 11/14/2016   HGB 12.1 (L) 11/14/2016   HGB 14.4 11/26/2013   HCT 34.6 (L) 11/14/2016    HCT 21.4 (L) 11/07/2016   PLT 410 11/14/2016   PLT 200 11/26/2013   LYMPHOPCT 22 11/10/2016   LYMPHOPCT 21.2 09/10/2013   MONOPCT 9 11/10/2016   MONOPCT 6.9 09/10/2013   EOSPCT 2 11/10/2016   EOSPCT 3.2 09/10/2013   BASOPCT 1 11/10/2016   BASOPCT 0.8 09/10/2013    CMP: Lab Results  Component Value Date   NA 146 (H) 11/14/2016   NA 137 11/26/2013   K 3.5 11/14/2016   K 3.9 11/26/2013   CL 116 (H) 11/14/2016   CL 106 11/26/2013   CO2 24 11/14/2016   CO2 25 11/26/2013   BUN 36 (H) 11/14/2016   BUN 17 11/26/2013   CREATININE 1.29 (H) 11/14/2016   CREATININE 1.16 11/26/2013   PROT 6.5 11/07/2016   PROT 6.8 11/26/2013   ALBUMIN 3.6 11/07/2016   ALBUMIN 3.5 11/26/2013   BILITOT 0.6 11/07/2016   BILITOT 0.4 11/26/2013   ALKPHOS 48 11/07/2016   ALKPHOS 60 11/26/2013   AST 18 11/07/2016   AST 14 (L) 11/26/2013   ALT 20 11/07/2016   ALT 20 11/26/2013  .   Total Time in preparing paper work, data evaluation and todays exam - 35 minutes  Panhia Karl M.D on 11/15/2016 at 10:13 AM    Note: This dictation was prepared with Dragon dictation along with smaller phrase technology. Any transcriptional errors that result from this process are unintentional.

## 2016-11-26 NOTE — Progress Notes (Signed)
Consultation Note Date: 11/13/2016   Patient Name: [name] [name].  DOB:   1960-11-11        MRN:   [id number]      Age / Sex:       56 y.o., male  PCP: Revelo, Elyse Jarvis, MD Referring Physician: Gladstone Lighter, MD  Reason for Consultation: Establishing goals of care  HPI/Patient Profile:  LinniesPrideis a 56 y.o.malebrought in from [facility] for [facility] status. He was admitted for Acute CVA, and GIB. He has a history of HTN, DM, GERD, CAD, and bipolar disorder.   Clinical Assessment and Goals of Care: Went to see patient this morning, his sister Scherrie Gerlach was at the bedside asking for water for Mr. Chriswell. We discussed that he would not be able to drink water at this time. She and I had spoken yesterday while attempting to gain a phone number for Jonelle Sidle, Mr. Mandt's daughter, which at this time is not provided. She volunteered, " I know it's up to them", but stated she believes he would want everything done for him, and he nodded his head. She stated they are a very religious family and believes God can do anything, he nodded and said "praise God". She is aware there is a family meeting at 1:00 with Danne Baxter, hoping to relay this to [place].   Met with patient and his son Bakari Nikolai. at 1:00. Mr. Vassell is improving since yesterday and is able to nod his head, he is attempting to speak, though it is minimally to unintelligible, and is laughing appropriately.  Discussed his status and goals of care with his son. His son looked at his meal and asked "waht if he don't want to eat that?" We discussed the purpose of the diet and aggressive care versus comfort care, and decisions between the two. He states he is expecting a baby in January and his father smiled. Upon discussing the baby's name and sex, Mr. Intriago  laughed. Patient nodded in agreement, when son states he wants everything done for his father. His son stated "this is a second chance for you, what are you going to do with it?", talking to his father.  His son states Mr. Kleinert has sleep apnea.    He is agreeable to PEG tube placement if needed. When discussing code status including chest compressions, defibrillation, medications, and intubation, his son said to do everything, his father nodded. When discussing ventilator placement he nodded, and when his son asked him how long he would want to remain on a ventilator, he did not respond. His son states we will make that decision at that time.        Patient with questionable waxing and waning mental status. Between conversations, patient (with nodding), sister, and son are in agreement.     SUMMARY OF RECOMMENDATIONS   Continue current course of treatment.   Code Status/Advance Care Planning:  Full code  Palliative Prophylaxis:   Aspiration, Bowel Regimen and Oral Care   Prognosis:   Unable to determine Depending on progress and oral intake.   Discharge Planning: To Be Determined      Primary Diagnoses: Present on Admission: . GI bleed   I have reviewed the medical record, interviewed the patient and family, and examined the patient. The following aspects are pertinent.      Past Medical History:  Diagnosis Date  . Aortic stenosis, mild February 2015  . Auditory hallucinations   . Bipolar disorder ([place])   . Coronary artery  disease    No reported catheterization. Negative Myoview stress test and 2013.  Marland Kitchen Depression   . Diabetes mellitus   . GERD (gastroesophageal reflux disease)   . History of suicide attempt   . Hx of medication noncompliance   . Hypertension   . Hypertensive hypertrophic cardiomyopathy [facility]) February 2015   Severe concentric LVH on echocardiogram.  . Obesity, Class II, BMI 35-39.9, with comorbidity   . Pericarditis     . Polysubstance abuse ([place])    cocaine, marijuana  . Stroke [facility]) February 2015   Right MCA aneurysm; head MRI - acute infarct of the right frontal lobe. Smaller acute infarct of the left frontal lobe. Thought to be due to cerebral emboli. Left PICA infarct. Small chronic lacunar infarct right internal capsule. Chronic microvascular ischemic change with white matter change.  . Syncope and collapse    Social History        Social History  . Marital status: Single    Spouse name: N/A  . Number of children: N/A  . Years of education: N/A         Social History Main Topics  . Smoking status: Former Smoker    Packs/day: 0.50    Years: 40.00    Types: Cigars  . Smokeless tobacco: Never Used  . Alcohol use No     Comment: occasionally; reports 1 pint of vodka per month at paydaysocially  . Drug use: Yes    Frequency: 1.0 time per week    Types: Marijuana, Cocaine     Comment: past use; denies current; UDS positive for cocaine  . Sexual activity: Yes       Other Topics Concern  . None      Social History Narrative   Single with no children.  No exercise.   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   Has a history of polysubstance abuse including cocaine. No she she urine toxicology screen in the hospital was positive.  He also current smoker.   He is seen by behavioral health for bipolar disorder.        Family History  Problem Relation Age of Onset  . Stroke Mother   . Diabetes type II Mother   . Hypertension Mother   . Hypertension Father   . Arthritis Father   . Arthritis Sister   . Diabetes type II Sister   . Hypertension Sister    Scheduled Meds: . acetaminophen  650 mg Oral Once  . aspirin  81 mg Oral Daily  . atorvastatin  40 mg Oral q1800  . clopidogrel  75 mg Oral Daily  . folic acid  1 mg Oral Daily  . insulin aspart  0-5 Units Subcutaneous QHS  . insulin aspart  0-9  Units Subcutaneous TID WC  . mouth rinse  15 mL Mouth Rinse BID  . multivitamin with minerals  1 tablet Oral Daily  . pantoprazole  40 mg Oral BID AC  . thiamine  100 mg Oral Daily   Or  . thiamine  100 mg Intravenous Daily  . traZODone  150 mg Oral QHS   Continuous Infusions: PRN Meds:.acetaminophen **OR** acetaminophen, LORazepam, ondansetron (ZOFRAN) IV Medications Prior to Admission:         Prior to Admission medications   Medication Sig Start Date End Date Taking? Authorizing Provider  amLODipine (NORVASC) 10 MG tablet Take 1 tablet (10 mg total) by mouth daily. 04/17/16  Yes Hildred Priest,  MD  aspirin 81 MG EC tablet Take 1 tablet (81 mg total) by mouth daily. 04/17/16  Yes Hildred Priest, MD  atorvastatin (LIPITOR) 20 MG tablet Take 1 tablet (20 mg total) by mouth daily at 6 PM. 04/17/16  Yes Hildred Priest, MD  chlorthalidone (HYGROTON) 25 MG tablet Take 1 tablet (25 mg total) by mouth daily. 04/17/16  Yes Hildred Priest, MD  clopidogrel (PLAVIX) 75 MG tablet Take 1 tablet (75 mg total) by mouth daily. 04/17/16  Yes Hildred Priest, MD  insulin aspart (NOVOLOG) 100 UNIT/ML FlexPen Inject 10 Units into the skin 3 (three) times daily with meals. 07/26/13  Yes [provider]  metFORMIN (GLUCOPHAGE) 500 MG tablet Take 1 tablet (500 mg total) by mouth 2 (two) times daily with a meal. 04/17/16  Yes Hildred Priest, MD  naproxen (NAPROSYN) 500 MG tablet Take 1 tablet (500 mg total) by mouth 2 (two) times daily with a meal. 09/20/16  Yes Lavonia Drafts, MD  nitroGLYCERIN (NITROSTAT) 0.4 MG SL tablet Place 1 tablet (0.4 mg total) under the tongue every 5 (five) minutes as needed for chest pain. 04/17/16  Yes Hildred Priest, MD  traZODone (DESYREL) 150 MG tablet Take 1 tablet (150 mg total) by mouth at bedtime. 04/17/16  Yes Hildred Priest, MD   No Known Allergies Review of Systems    Unable to perform ROS   Physical Exam  Constitutional: No distress.  Eyes: EOM are normal.  Cardiovascular:  Warm and dry  Pulmonary/Chest: Effort normal.  Neurological: He is alert.    Vital Signs: BP (!) 149/94 (BP Location: Right Arm)   Pulse 81   Temp (!) 97.3 F (36.3 C) (Oral)   Resp (!) 21   Ht _0  (1.778 m)   Wt 102.1 kg (225 lb)   SpO2 93%   BMI 32.28 kg/m  Pain Assessment: PAINAD Pain Score: 0-No pain   SpO2: SpO2: 93 % O2 Device:SpO2: 93 % O2 Flow Rate: .O2 Flow Rate (L/min): 0 L/min  IO: Intake/output summary: No intake or output data in the 24 hours ending 11/13/16 1319  LBM: Last BM Date: 11/10/16 Baseline Weight: Weight: 102.1 kg (225 lb) Most recent weight: Weight: 102.1 kg (225 lb)     Palliative Assessment/Data: 40%    Time In: 1:45 Time Out: 2:00 Time Total: 1 hour 15 minutes Greater than 50%  of this time was spent counseling and coordinating care related to the above assessment and plan.  Signed by: Asencion Gowda, NP 11/13/2016 2:03 PM Office: [phone] 7am-7pm  Pager: [phone] Call primary team after hours   Please contact Palliative Medicine Team phone at [phone] for questions and concerns.  For individual provider: See Shea Evans

## 2016-11-28 ENCOUNTER — Other Ambulatory Visit: Payer: Self-pay

## 2016-11-29 ENCOUNTER — Ambulatory Visit: Payer: Medicaid Other | Admitting: Gastroenterology

## 2016-12-03 ENCOUNTER — Encounter: Payer: Self-pay | Admitting: Gastroenterology

## 2016-12-03 ENCOUNTER — Other Ambulatory Visit: Payer: Self-pay

## 2016-12-03 ENCOUNTER — Ambulatory Visit: Payer: Medicaid Other | Admitting: Gastroenterology

## 2017-01-10 ENCOUNTER — Ambulatory Visit: Payer: Medicaid Other | Admitting: Gastroenterology

## 2017-01-23 ENCOUNTER — Ambulatory Visit: Payer: Medicaid Other | Admitting: Gastroenterology

## 2017-02-12 ENCOUNTER — Encounter (INDEPENDENT_AMBULATORY_CARE_PROVIDER_SITE_OTHER): Payer: Self-pay

## 2017-02-12 ENCOUNTER — Ambulatory Visit (INDEPENDENT_AMBULATORY_CARE_PROVIDER_SITE_OTHER): Payer: Medicaid Other | Admitting: Gastroenterology

## 2017-02-12 ENCOUNTER — Encounter: Payer: Self-pay | Admitting: Gastroenterology

## 2017-02-12 ENCOUNTER — Other Ambulatory Visit: Payer: Self-pay

## 2017-02-12 VITALS — BP 136/92 | HR 72 | Ht 71.0 in | Wt 244.6 lb

## 2017-02-12 DIAGNOSIS — D62 Acute posthemorrhagic anemia: Secondary | ICD-10-CM | POA: Diagnosis not present

## 2017-02-12 NOTE — Progress Notes (Signed)
[name], MD [address]  [address]  [place], [place] [address]  Main: [phone]  Fax: [phone]   Primary Care Physician: Preston Fleeting, MD  Primary Gastroenterologist:  Dr. [name]  Chief Complaint  Patient presents with  . Establish Care    GERD    HPI: [name] Kadlec. is a 57 y.o. male who was recently in the hospital for anemia and with an acute stroke.  He was admitted in October 2018 and was seen by Dr. Allegra Lai.  He was found to have acute anemia of 7.5 when he presented with altered mental status.  It was 13.5 on October 1, and 7.5 on October 4.  When he presented on October 1 he left the hospital without any instructions and came back on October 4.  He has had no previous endoscopies.  He has history of schizophrenia and polysubstance abuse and a history of stroke.  When he presented to the hospital in October for due to altered mental status, facial droop and slurred speech he underwent neuro workup and was diagnosed with an acute stroke.  He is currently on antiplatelet therapy with aspirin and Plavix.  He is a poor historian and history is very unreliable.  Living facility staff and family are not accompanying the patient today.  He denies any bright blood per rectum or black stool.  Denies any abdominal pain.  Reports normal appetite.  Denies any nausea vomiting.  He is on pantoprazole twice daily.  He has not see neurology since discharge from the hospital.  Current Outpatient Medications  Medication Sig Dispense Refill  . aspirin 81 MG EC tablet Take 1 tablet (81 mg total) by mouth daily. 30 tablet 0  . atorvastatin (LIPITOR) 20 MG tablet Take 1 tablet (20 mg total) by mouth daily at 6 PM. 30 tablet 0  . chlorthalidone (HYGROTON) 25 MG tablet Take 1 tablet (25 mg total) by mouth daily. 30 tablet 0  . clopidogrel (PLAVIX) 75 MG tablet Take 1 tablet (75 mg total) by mouth daily. 30 tablet 0  . nitroGLYCERIN (NITROSTAT) 0.4 MG SL  tablet Place 1 tablet (0.4 mg total) under the tongue every 5 (five) minutes as needed for chest pain. 15 tablet 0  . pantoprazole (PROTONIX) 40 MG tablet Take 1 tablet (40 mg total) by mouth 2 (two) times daily before a meal. 30 tablet 0  . thiamine 100 MG tablet Take 1 tablet (100 mg total) by mouth daily. 30 tablet 0  . traZODone (DESYREL) 150 MG tablet Take 1 tablet (150 mg total) by mouth at bedtime. 30 tablet 0  . amLODipine (NORVASC) 10 MG tablet Take 1 tablet (10 mg total) by mouth daily. 30 tablet 0  . busPIRone (BUSPAR) 10 MG tablet Take by mouth.    . cloNIDine (CATAPRES) 0.3 MG tablet Take by mouth.    . Diclofenac Sodium 3 % GEL Place onto the skin.    Marland Kitchen FLUARIX QUADRIVALENT 0.5 ML injection inject 0.5 milliliter intramuscularly  0  . insulin aspart (NOVOLOG) 100 UNIT/ML injection Inject 0-9 Units into the skin 3 (three) times daily with meals. 10 mL 11  . insulin aspart (NOVOLOG) 100 UNIT/ML injection Inject 0-5 Units into the skin at bedtime. 10 mL 11  . Insulin Glargine (LANTUS SOLOSTAR) 100 UNIT/ML Solostar Pen Inject into the skin.    Marland Kitchen lisinopril (PRINIVIL,ZESTRIL) 40 MG tablet Take by mouth.    . metFORMIN (GLUCOPHAGE) 500 MG tablet Take one tablet by mouth  twice a day    . metoprolol tartrate (LOPRESSOR) 25 MG tablet Take 1 tablet (25 mg total) by mouth 2 (two) times daily. 60 tablet 0  . naproxen (NAPROSYN) 500 MG tablet   0  . oxyCODONE-acetaminophen (PERCOCET/ROXICET) 5-325 MG tablet take 1 tablet by mouth every 6 hours if needed for severe pain  0  . sertraline (ZOLOFT) 50 MG tablet Take by mouth.     No current facility-administered medications for this visit.     Allergies as of 02/12/2017  . (No Known Allergies)    ROS:  General: Negative for anorexia, weight loss, fever, chills, fatigue, weakness. ENT: Negative for hoarseness, difficulty swallowing , nasal congestion. CV: Negative for chest pain, angina, palpitations, dyspnea on exertion, peripheral edema.    Respiratory: Negative for dyspnea at rest, dyspnea on exertion, cough, sputum, wheezing.  GI: See history of present illness. GU:  Negative for dysuria, hematuria, urinary incontinence, urinary frequency, nocturnal urination.  Endo: Negative for unusual weight change.    Physical Examination:   BP (!) 136/92   Pulse 72   Ht 5\' 11"  (1.803 m)   Wt 244 lb 9.6 oz (110.9 kg)   BMI 34.11 kg/m   General: Well-nourished, well-developed in no acute distress.  Eyes: No icterus. Conjunctivae pink. Mouth: Oropharyngeal mucosa moist and pink , no lesions erythema or exudate. Lungs: Clear to auscultation bilaterally. Non-labored. Heart: Regular rate and rhythm, no murmurs rubs or gallops.  Abdomen: Bowel sounds are normal, nontender, nondistended, no hepatosplenomegaly or masses, no abdominal bruits or hernia , no rebound or guarding.   Extremities: No lower extremity edema. No clubbing or deformities. Skin: Warm and dry, no jaundice.   Psych: Alert and cooperative, normal mood and affect.   Labs: CMP     Component Value Date/Time   NA 146 (H) 11/14/2016 0458   NA 137 11/26/2013 1006   K 3.5 11/14/2016 0458   K 3.9 11/26/2013 1006   CL 116 (H) 11/14/2016 0458   CL 106 11/26/2013 1006   CO2 24 11/14/2016 0458   CO2 25 11/26/2013 1006   GLUCOSE 148 (H) 11/14/2016 0458   GLUCOSE 118 (H) 11/26/2013 1006   BUN 36 (H) 11/14/2016 0458   BUN 17 11/26/2013 1006   CREATININE 1.29 (H) 11/14/2016 0458   CREATININE 1.16 11/26/2013 1006   CALCIUM 9.2 11/14/2016 0458   CALCIUM 8.4 (L) 11/26/2013 1006   PROT 6.5 11/07/2016 1323   PROT 6.8 11/26/2013 1006   ALBUMIN 3.6 11/07/2016 1323   ALBUMIN 3.5 11/26/2013 1006   AST 18 11/07/2016 1323   AST 14 (L) 11/26/2013 1006   ALT 20 11/07/2016 1323   ALT 20 11/26/2013 1006   ALKPHOS 48 11/07/2016 1323   ALKPHOS 60 11/26/2013 1006   BILITOT 0.6 11/07/2016 1323   BILITOT 0.4 11/26/2013 1006   GFRNONAA >60 11/14/2016 0458   GFRNONAA >60 09/21/2013  2100   GFRAA >60 11/14/2016 0458   GFRAA >60 09/21/2013 2100   Lab Results  Component Value Date   WBC 10.0 11/14/2016   HGB 12.1 (L) 11/14/2016   HCT 34.6 (L) 11/14/2016   MCV 91.1 11/14/2016   PLT 410 11/14/2016    Imaging Studies: No results found.  Assessment and Plan:   [name] LimLinnies Burdett Jr. is a 57 y.o. y/o male recently in the hospital in October 2018 with anemia and acute stroke currently on antiplatelet therapy  Patient has not seen neurology since discharge from the hospital Will need neurology evaluation to  determine when it is safe to hold his antiplatelets for diagnostic endoscopy He has been managed with PPI twice daily, as endoscopy was considered high risk in the setting of acute stroke during his admission. He has no signs of active GI bleeding at this time and no indication for emergent endoscopy He was on NSAIDs at home prior to the hospital admission and these have been held since the admission and discharge. He will continue to hold this Written instructions were sent to his facility to refer patient to neurology Instructions were also sent to obtain CBC to evaluate the status of his anemia.  It was 12.1 at the time of his discharge on October 11. If antiplatelets cannot be safely held, after evaluation by neurology, patient can be continued to be managed with PPI. He is due for a screening colonoscopy as per his age. however, due to the recent stroke, a CT colonoscopy can be done instead as his antiplatelets will not need to be held for that.  No clinical signs of bleeding at this time to indicate urgent colonoscopy. Will await neurology evaluation  Continue to hold NSAIDs Continue PPI twice daily  Dr [name]

## 2017-03-10 ENCOUNTER — Telehealth: Payer: Self-pay | Admitting: Gastroenterology

## 2017-03-10 NOTE — Telephone Encounter (Signed)
LVM at nursing facility for them to call and reschedule patient

## 2017-03-12 ENCOUNTER — Telehealth: Payer: Self-pay | Admitting: Gastroenterology

## 2017-03-12 NOTE — Telephone Encounter (Signed)
Patient is no longer at [facility]. Called the daughter and she stated she would give him the message to reschedule and she would have him call me.

## 2017-03-26 ENCOUNTER — Ambulatory Visit: Payer: Self-pay | Admitting: Gastroenterology

## 2017-03-31 ENCOUNTER — Ambulatory Visit (INDEPENDENT_AMBULATORY_CARE_PROVIDER_SITE_OTHER): Payer: Medicaid Other | Admitting: Gastroenterology

## 2017-03-31 ENCOUNTER — Encounter (INDEPENDENT_AMBULATORY_CARE_PROVIDER_SITE_OTHER): Payer: Self-pay

## 2017-03-31 ENCOUNTER — Other Ambulatory Visit: Payer: Self-pay

## 2017-03-31 ENCOUNTER — Encounter: Payer: Self-pay | Admitting: Gastroenterology

## 2017-03-31 ENCOUNTER — Ambulatory Visit: Payer: Self-pay | Admitting: Gastroenterology

## 2017-03-31 VITALS — BP 148/95 | HR 78 | Ht 71.0 in | Wt 256.0 lb

## 2017-03-31 DIAGNOSIS — D500 Iron deficiency anemia secondary to blood loss (chronic): Secondary | ICD-10-CM | POA: Diagnosis not present

## 2017-03-31 NOTE — Progress Notes (Signed)
[name] Bouillon, MD [address]  [address]  [place], [place] [address]  Main: [phone]  Fax: [phone]   Primary Care Physician: [name] Fleeting, MD  Primary Gastroenterologist:  Dr. Melodie [name]  Chief Complaint  Patient presents with  . Follow-up    stroke/anticoagulation therapy in 11/2016 for possible CT colonoscopy/EGD    HPI: [name] [name]. is a 57 y.o. male who presents for follow-up of anemia.  At the time of his last visit patient was living in a facility, and is now home.  See scanned recommendations sent to the facility at the time of last visit, which included follow-up with neurology for recent stroke in October 2018 , need for clearance of endoscopies, and need for neurology referral to discuss Plavix holding prior to any endoscopies.  The patient is a very poor historian, and is not accompanied by any family, unclear if he is on Plavix still.  Unclear if he was ever seen by neurology after discharge.  We do not have any of his primary care notes since his discharge either.  He reports brown stool at home, no melena or abdominal pain.  Current Outpatient Medications  Medication Sig Dispense Refill  . amLODipine (NORVASC) 10 MG tablet Take 1 tablet (10 mg total) by mouth daily. 30 tablet 0  . aspirin 81 MG EC tablet Take 1 tablet (81 mg total) by mouth daily. 30 tablet 0  . atorvastatin (LIPITOR) 20 MG tablet Take 1 tablet (20 mg total) by mouth daily at 6 PM. 30 tablet 0  . busPIRone (BUSPAR) 10 MG tablet Take by mouth.    . chlorthalidone (HYGROTON) 25 MG tablet Take 1 tablet (25 mg total) by mouth daily. 30 tablet 0  . cloNIDine (CATAPRES) 0.3 MG tablet Take by mouth.    . Diclofenac Sodium 3 % GEL Place onto the skin.    Marland Kitchen doxepin (SINEQUAN) 10 MG capsule Take 10 mg by mouth daily.    . insulin aspart (NOVOLOG) 100 UNIT/ML injection Inject 0-9 Units into the skin 3 (three) times daily with meals. 10 mL 11  . insulin aspart  (NOVOLOG) 100 UNIT/ML injection Inject 0-5 Units into the skin at bedtime. 10 mL 11  . Insulin Glargine (LANTUS SOLOSTAR) 100 UNIT/ML Solostar Pen Inject into the skin.    Marland Kitchen insulin regular (NOVOLIN R,HUMULIN R) 100 units/mL injection Inject into the skin.    Marland Kitchen lisinopril (PRINIVIL,ZESTRIL) 40 MG tablet Take by mouth.    . metFORMIN (GLUCOPHAGE) 500 MG tablet Take one tablet by mouth twice a day    . metoprolol tartrate (LOPRESSOR) 25 MG tablet Take 1 tablet (25 mg total) by mouth 2 (two) times daily. 60 tablet 0  . naproxen (NAPROSYN) 500 MG tablet   0  . nitroGLYCERIN (NITROSTAT) 0.4 MG SL tablet Place 1 tablet (0.4 mg total) under the tongue every 5 (five) minutes as needed for chest pain. 15 tablet 0  . oxyCODONE-acetaminophen (PERCOCET/ROXICET) 5-325 MG tablet take 1 tablet by mouth every 6 hours if needed for severe pain  0  . sertraline (ZOLOFT) 50 MG tablet Take by mouth.    . thiamine 100 MG tablet Take 1 tablet (100 mg total) by mouth daily. 30 tablet 0  . traZODone (DESYREL) 150 MG tablet Take 1 tablet (150 mg total) by mouth at bedtime. 30 tablet 0  . clopidogrel (PLAVIX) 75 MG tablet Take 1 tablet (75 mg total) by mouth daily. (Patient not taking: Reported on 03/31/2017) 30 tablet  0  . FLUARIX QUADRIVALENT 0.5 ML injection inject 0.5 milliliter intramuscularly  0  . pantoprazole (PROTONIX) 40 MG tablet Take 1 tablet (40 mg total) by mouth 2 (two) times daily before a meal. (Patient not taking: Reported on 03/31/2017) 30 tablet 0   No current facility-administered medications for this visit.     Allergies as of 03/31/2017  . (No Known Allergies)    ROS:  General: Negative for anorexia, weight loss, fever, chills, fatigue, weakness. ENT: Negative for hoarseness, difficulty swallowing , nasal congestion. CV: Negative for chest pain, angina, palpitations, dyspnea on exertion, peripheral edema.  Respiratory: Negative for dyspnea at rest, dyspnea on exertion, cough, sputum, wheezing.   GI: See history of present illness. GU:  Negative for dysuria, hematuria, urinary incontinence, urinary frequency, nocturnal urination.  Endo: Negative for unusual weight change.    Physical Examination:   BP (!) 148/95 (BP Location: Right Arm, Patient Position: Sitting, Cuff Size: Large)   Pulse 78   Ht 5\' 11"  (1.803 m)   Wt 256 lb (116.1 kg)   BMI 35.70 kg/m   General: Well-nourished, well-developed in no acute distress.  Eyes: No icterus. Conjunctivae pink. Mouth: Oropharyngeal mucosa moist and pink , no lesions erythema or exudate. Neck: Supple, Trachea midline Abdomen: Bowel sounds are normal, nontender, nondistended, no hepatosplenomegaly or masses, no abdominal bruits or hernia , no rebound or guarding.   Extremities: No lower extremity edema. No clubbing or deformities. Neuro: Alert and oriented x 3.  Grossly intact. Skin: Warm and dry, no jaundice.   Psych: Alert and cooperative, normal mood and affect.   Labs: CMP     Component Value Date/Time   NA 146 (H) 11/14/2016 0458   NA 137 11/26/2013 1006   K 3.5 11/14/2016 0458   K 3.9 11/26/2013 1006   CL 116 (H) 11/14/2016 0458   CL 106 11/26/2013 1006   CO2 24 11/14/2016 0458   CO2 25 11/26/2013 1006   GLUCOSE 148 (H) 11/14/2016 0458   GLUCOSE 118 (H) 11/26/2013 1006   BUN 36 (H) 11/14/2016 0458   BUN 17 11/26/2013 1006   CREATININE 1.29 (H) 11/14/2016 0458   CREATININE 1.16 11/26/2013 1006   CALCIUM 9.2 11/14/2016 0458   CALCIUM 8.4 (L) 11/26/2013 1006   PROT 6.5 11/07/2016 1323   PROT 6.8 11/26/2013 1006   ALBUMIN 3.6 11/07/2016 1323   ALBUMIN 3.5 11/26/2013 1006   AST 18 11/07/2016 1323   AST 14 (L) 11/26/2013 1006   ALT 20 11/07/2016 1323   ALT 20 11/26/2013 1006   ALKPHOS 48 11/07/2016 1323   ALKPHOS 60 11/26/2013 1006   BILITOT 0.6 11/07/2016 1323   BILITOT 0.4 11/26/2013 1006   GFRNONAA >60 11/14/2016 0458   GFRNONAA >60 09/21/2013 2100   GFRAA >60 11/14/2016 0458   GFRAA >60 09/21/2013 2100    Lab Results  Component Value Date   WBC 10.0 11/14/2016   HGB 12.1 (L) 11/14/2016   HCT 34.6 (L) 11/14/2016   MCV 91.1 11/14/2016   PLT 410 11/14/2016    Imaging Studies: No results found.  Assessment and Plan:   [name] LimLinnies Kham Jr. is a 57 y.o. y/o male here for follow-up of anemia, recent stroke in October 2018  Patient is a very poor historian, and we are unable to get accurate medication history from the patient Patient states he saw his primary care doctor about 2 weeks ago and had blood work done Will contact primary care provider to send records to see  what happens with his neurology referral He has no signs of active GI bleeding at this time and no indication for urgent endoscopies  Due to history of anemia on October 2018 visit, EGD can be considered to evaluate for any peptic ulcer disease once evaluated by neurology, and once they are able to tell us when his Plavix can be held safely.  This is not an urgent procedure, and elective EGD can be done when it is safe to hold Plavix.  Colorectal cancer screening can be done with CT colonoscopy if labs cannot be held at this time  Will await further records and proceed management accordingly  Dr [name] [name]

## 2017-04-21 ENCOUNTER — Encounter: Payer: Self-pay | Admitting: Emergency Medicine

## 2017-04-21 ENCOUNTER — Emergency Department
Admission: EM | Admit: 2017-04-21 | Discharge: 2017-04-21 | Disposition: A | Discharge status: Home / Self Care | Payer: Medicaid Other | Attending: Emergency Medicine | Admitting: Emergency Medicine

## 2017-04-21 DIAGNOSIS — Z7902 Long term (current) use of antithrombotics/antiplatelets: Secondary | ICD-10-CM | POA: Diagnosis not present

## 2017-04-21 DIAGNOSIS — I2510 Atherosclerotic heart disease of native coronary artery without angina pectoris: Secondary | ICD-10-CM | POA: Diagnosis not present

## 2017-04-21 DIAGNOSIS — E119 Type 2 diabetes mellitus without complications: Secondary | ICD-10-CM | POA: Diagnosis not present

## 2017-04-21 DIAGNOSIS — Z7982 Long term (current) use of aspirin: Secondary | ICD-10-CM | POA: Diagnosis not present

## 2017-04-21 DIAGNOSIS — F17210 Nicotine dependence, cigarettes, uncomplicated: Secondary | ICD-10-CM | POA: Insufficient documentation

## 2017-04-21 DIAGNOSIS — Z8673 Personal history of transient ischemic attack (TIA), and cerebral infarction without residual deficits: Secondary | ICD-10-CM | POA: Insufficient documentation

## 2017-04-21 DIAGNOSIS — I5032 Chronic diastolic (congestive) heart failure: Secondary | ICD-10-CM | POA: Insufficient documentation

## 2017-04-21 DIAGNOSIS — I110 Hypertensive heart disease with heart failure: Secondary | ICD-10-CM | POA: Diagnosis not present

## 2017-04-21 DIAGNOSIS — Z794 Long term (current) use of insulin: Secondary | ICD-10-CM | POA: Insufficient documentation

## 2017-04-21 DIAGNOSIS — R51 Headache: Secondary | ICD-10-CM | POA: Insufficient documentation

## 2017-04-21 DIAGNOSIS — Z79899 Other long term (current) drug therapy: Secondary | ICD-10-CM | POA: Insufficient documentation

## 2017-04-21 DIAGNOSIS — R519 Headache, unspecified: Secondary | ICD-10-CM

## 2017-04-21 LAB — COMPREHENSIVE METABOLIC PANEL
ALT: 18 U/L (ref 17–63)
AST: 23 U/L (ref 15–41)
Albumin: 3.9 g/dL (ref 3.5–5.0)
Alkaline Phosphatase: 56 U/L (ref 38–126)
Anion gap: 9 (ref 5–15)
BUN: 20 mg/dL (ref 6–20)
CHLORIDE: 103 mmol/L (ref 101–111)
CO2: 21 mmol/L — AB (ref 22–32)
CREATININE: 1.01 mg/dL (ref 0.61–1.24)
Calcium: 8.5 mg/dL — ABNORMAL LOW (ref 8.9–10.3)
GFR calc non Af Amer: >60 mL/min (ref >60)
Glucose, Bld: 100 mg/dL — ABNORMAL HIGH (ref 65–99)
POTASSIUM: 3.5 mmol/L (ref 3.5–5.1)
SODIUM: 133 mmol/L — AB (ref 135–145)
Total Bilirubin: 0.6 mg/dL (ref 0.3–1.2)
Total Protein: 7.7 g/dL (ref 6.5–8.1)

## 2017-04-21 LAB — SEDIMENTATION RATE: Sed Rate: 9 mm/hr (ref 0–20)

## 2017-04-21 LAB — CBC
HCT: 40.8 % (ref 40.0–52.0)
Hemoglobin: 13.7 g/dL (ref 13.0–18.0)
MCH: 29.2 pg (ref 26.0–34.0)
MCHC: 33.7 g/dL (ref 32.0–36.0)
MCV: 86.8 fL (ref 80.0–100.0)
PLATELETS: 282 10*3/uL (ref 150–440)
RBC: 4.7 MIL/uL (ref 4.40–5.90)
RDW: 14.2 % (ref 11.5–14.5)
WBC: 9.4 10*3/uL (ref 3.8–10.6)

## 2017-04-21 MED ORDER — BUTALBITAL-APAP-CAFFEINE 50-325-40 MG PO TABS: 1.0000 | ORAL_TABLET | Freq: Four times a day (QID) | ORAL | 0 refills | Status: AC | PRN

## 2017-04-21 MED ORDER — METOCLOPRAMIDE HCL 5 MG/ML IJ SOLN
20.0000 mg | Freq: Once | INTRAMUSCULAR | Status: AC
  Administered 2017-04-21: 20 mg via INTRAVENOUS
  Filled 2017-04-21: qty 4

## 2017-04-21 MED ORDER — DIPHENHYDRAMINE HCL 50 MG/ML IJ SOLN
25.0000 mg | Freq: Once | INTRAMUSCULAR | Status: AC
  Administered 2017-04-21: 25 mg via INTRAVENOUS
  Filled 2017-04-21: qty 1

## 2017-04-21 MED ORDER — SODIUM CHLORIDE 0.9 % IV SOLN
1000.0000 mL | Freq: Once | INTRAVENOUS | Status: AC
  Administered 2017-04-21: 1000 mL via INTRAVENOUS

## 2017-04-21 MED ORDER — KETOROLAC TROMETHAMINE 30 MG/ML IJ SOLN
30.0000 mg | Freq: Once | INTRAMUSCULAR | Status: AC
  Administered 2017-04-21: 30 mg via INTRAVENOUS
  Filled 2017-04-21: qty 1

## 2017-04-21 NOTE — ED Provider Notes (Signed)
[facility] Emergency Department Provider Note   ____________________________________________    I have reviewed the triage vital signs and the nursing notes.   HISTORY  Chief Complaint Headache     HPI [name] [name]. is a 57 y.o. male who presents with complaints of a headache.  Patient reports left-sided temporal headache which he describes as an 8 out of 10.  He has not taken anything for it.  He denies fevers or chills.  No neck pain.  Does have a history of headaches.  No neuro deficits.  His Pain is throbbing in nature.  No change in vision.   Past Medical History:  Diagnosis Date  . Aortic stenosis, mild February 2015  . Auditory hallucinations   . Bipolar disorder ([place])   . Coronary artery disease    No reported catheterization. Negative Myoview stress test and 2013.  Marland Kitchen Depression   . Diabetes mellitus   . GERD (gastroesophageal reflux disease)   . History of suicide attempt   . Hx of medication noncompliance   . Hypertension   . Hypertensive hypertrophic cardiomyopathy [facility]) February 2015   Severe concentric LVH on echocardiogram.  . Obesity, Class II, BMI 35-39.9, with comorbidity   . Pericarditis   . Polysubstance abuse ([place])    cocaine, marijuana  . Stroke [facility]) February 2015   Right MCA aneurysm; head MRI - acute infarct of the right frontal lobe. Smaller acute infarct of the left frontal lobe. Thought to be due to cerebral emboli. Left PICA infarct. Small chronic lacunar infarct right internal capsule. Chronic microvascular ischemic change with white matter change.  . Syncope and collapse     Patient Active Problem List   Diagnosis Date Noted  . GI bleed 11/07/2016  . Accelerated hypertension 06/07/2016  . GERD (gastroesophageal reflux disease) 06/07/2016  . Bipolar affective disorder ([place]) 06/07/2016  . Chest pain 06/07/2016  . Unspecified Depressive disorder 04/17/2016  . 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 Chronic diastolic CHF (congestive heart failure) ([place]) 10/02/2015  . Tobacco use disorder 11/07/2014  . Stimulant use disorder ([place]) crack cocaine 11/07/2014  . Alcohol use disorder, moderate, dependence ([place]) 11/07/2014  . CAD (coronary artery disease) 11/05/2014  . Cerebral infarction ([place]) 10/19/2014  . LVH (left ventricular hypertrophy) 05/20/2013  . Obesity (BMI 30-39.9) 04/10/2013  . Aortic stenosis, mild 03/07/2013  . DM (diabetes mellitus), type 2, uncontrolled ([place]) 01/05/2013  . Essential hypertension, malignant 01/25/2009    Past Surgical History:  Procedure Laterality Date  . Carotid Dopplers:  03/23/2013   Interval thickening of bilateral common carotid no significant plaque formation. Tortuosity but no significant stenosis. Patent bilateral vertebral  arteries  . NM MYOVIEW LTD  July 2013   No ischemia or infarct. There is inferior wall defec suggestive of diaphragmatic attenuation. Mild LV dilation at to be due to hypertensive heart disease. EF 55%. Poor exercise tolerance.  Marland Kitchen PERICARDIAL FLUID DRAINAGE    . TRANSTHORACIC ECHOCARDIOGRAM  03/23/2013   EF 5560%. Elevated left atrial pressure. Severe concentric LVH. Mild aortic stenosis. -- Appearance of hypertensive hypertrophic heart disease.  No clear cardioembolic source    Prior to Admission medications   Medication Sig Start Date End Date Taking? Authorizing Provider  amLODipine (NORVASC) 10 MG tablet Take 1 tablet (10 mg total) by mouth daily. 04/17/16   Hildred Priest, MD  aspirin 81 MG EC tablet Take 1 tablet (81 mg total) by mouth daily. 04/17/16   Hernandez-Gonzalez,  Seth Bake, MD  atorvastatin (LIPITOR) 20 MG tablet Take 1 tablet (20 mg total) by mouth daily at 6 PM. 04/17/16   Hildred Priest, MD  busPIRone (BUSPAR) 10 MG tablet Take by mouth. 05/29/13   [provider]  butalbital-acetaminophen-caffeine Emelda Brothers, ESGIC) 30-865-78 MG tablet Take  1-2 tablets by mouth every 6 (six) hours as needed for headache. 04/21/17 04/21/18  Lavonia Drafts, MD  chlorthalidone (HYGROTON) 25 MG tablet Take 1 tablet (25 mg total) by mouth daily. 04/17/16   Hildred Priest, MD  cloNIDine (CATAPRES) 0.3 MG tablet Take by mouth.    [provider]  clopidogrel (PLAVIX) 75 MG tablet Take 1 tablet (75 mg total) by mouth daily. Patient not taking: Reported on 03/31/2017 04/17/16   Hildred Priest, MD  Diclofenac Sodium 3 % GEL Place onto the skin. 05/06/16   [provider]  doxepin (SINEQUAN) 10 MG capsule Take 10 mg by mouth daily.    [provider]  FLUARIX QUADRIVALENT 0.5 ML injection inject 0.5 milliliter intramuscularly 10/30/16   [provider]  insulin aspart (NOVOLOG) 100 UNIT/ML injection Inject 0-9 Units into the skin 3 (three) times daily with meals. 11/15/16   Epifanio Lesches, MD  insulin aspart (NOVOLOG) 100 UNIT/ML injection Inject 0-5 Units into the skin at bedtime. 11/15/16   Epifanio Lesches, MD  Insulin Glargine (LANTUS SOLOSTAR) 100 UNIT/ML Solostar Pen Inject into the skin.    [provider]  insulin regular (NOVOLIN R,HUMULIN R) 100 units/mL injection Inject into the skin.    [provider]  lisinopril (PRINIVIL,ZESTRIL) 40 MG tablet Take by mouth.    [provider]  metFORMIN (GLUCOPHAGE) 500 MG tablet Take one tablet by mouth twice a day 05/29/13   [provider]  metoprolol tartrate (LOPRESSOR) 25 MG tablet Take 1 tablet (25 mg total) by mouth 2 (two) times daily. 11/15/16   Epifanio Lesches, MD  naproxen (NAPROSYN) 500 MG tablet  10/30/16   [provider]  nitroGLYCERIN (NITROSTAT) 0.4 MG SL tablet Place 1 tablet (0.4 mg total) under the tongue every 5 (five) minutes as needed for chest pain. 04/17/16   Hildred Priest, MD  oxyCODONE-acetaminophen (PERCOCET/ROXICET) 5-325 MG tablet take 1 tablet by mouth every 6  hours if needed for severe pain 09/24/16   [provider]  pantoprazole (PROTONIX) 40 MG tablet Take 1 tablet (40 mg total) by mouth 2 (two) times daily before a meal. Patient not taking: Reported on 03/31/2017 11/15/16   Epifanio Lesches, MD  sertraline (ZOLOFT) 50 MG tablet Take by mouth. 04/17/16   [provider]  thiamine 100 MG tablet Take 1 tablet (100 mg total) by mouth daily. 11/15/16   Epifanio Lesches, MD  traZODone (DESYREL) 150 MG tablet Take 1 tablet (150 mg total) by mouth at bedtime. 04/17/16   Hildred Priest, MD     Allergies Patient has no known allergies.  Family History  Problem Relation Age of Onset  . Stroke Mother   . Diabetes type II Mother   . Hypertension Mother   . Hypertension Father   . Arthritis Father   . Arthritis Sister   . Diabetes type II Sister   . Hypertension Sister     Social History Social History   Tobacco Use  . Smoking status: Current Every Day Smoker    Packs/day: 0.50    Years: 40.00    Pack years: 20.00    Types: Cigars, Cigarettes  . Smokeless tobacco: Never Used  Substance Use Topics  .  Alcohol use: No    Alcohol/week: 25.8 oz    Types: 3 Glasses of wine, 40 Cans of beer per week    Comment: occasionally; reports 1 pint of vodka per month at paydaysocially  . Drug use: Yes    Frequency: 1.0 times per week    Types: Marijuana, Cocaine    Comment: past use; denies current; UDS positive for cocaine    Review of Systems  Constitutional: No fever/chills Eyes: No visual changes.  ENT: No sore throat. Cardiovascular: Denies palpitations Respiratory: Denies cough Gastrointestinal:  No nausea, no vomiting.   Genitourinary: Negative for difficulty urinating Musculoskeletal: Negative for neck pain Skin: Negative for rash. Neurological: Headache as above, no weakness   ____________________________________________   PHYSICAL EXAM:  VITAL SIGNS: ED Triage Vitals  Enc Vitals Group      BP 04/21/17 0852 (!) 161/100     Pulse Rate 04/21/17 0852 76     Resp 04/21/17 0852 20     Temp 04/21/17 0852 97.8 F (36.6 C)     Temp Source 04/21/17 0852 Oral     SpO2 04/21/17 0846 98 %     Weight 04/21/17 0853 113.4 kg (250 lb)     Height 04/21/17 0853 1.803 m ('5\' 11"'$ )     Head Circumference --      Peak Flow --      Pain Score 04/21/17 0853 10     Pain Loc --      Pain Edu? --      Excl. in [place]? --     Constitutional: Alert and oriented.  Pleasant and interactive Eyes: Conjunctivae are normal.  PERRLA, EOMI Head: Atraumatic. Nose: No congestion/rhinnorhea. Mouth/Throat: Mucous membranes are moist.   Neck:  Painless ROM Cardiovascular: Normal rate, regular rhythm.  Good peripheral circulation. Respiratory: Normal respiratory effort.  No retractions.  Gastrointestinal: Soft and nontender. No distention.    Musculoskeletal: Warm and well perfused Neurologic:  Normal speech and language. No gross focal neurologic deficits are appreciated.  No tenderness over the temples Skin:  Skin is warm, dry and intact. No rash noted. Psychiatric: Mood and affect are normal. Speech and behavior are normal.  ____________________________________________   LABS (all labs ordered are listed, but only abnormal results are displayed)  Labs Reviewed  COMPREHENSIVE METABOLIC PANEL - Abnormal; Notable for the following components:      Result Value   Sodium 133 (*)    CO2 21 (*)    Glucose, Bld 100 (*)    Calcium 8.5 (*)    All other components within normal limits  SEDIMENTATION RATE  CBC   ____________________________________________  EKG  None ____________________________________________  RADIOLOGY  None ____________________________________________   PROCEDURES  Procedure(s) performed: No  Procedures   Critical Care performed: No ____________________________________________   INITIAL IMPRESSION / ASSESSMENT AND PLAN / ED COURSE  Pertinent labs & imaging results  that were available during my care of the patient were reviewed by me and considered in my medical decision making (see chart for details).  Patient well-appearing in no acute distress.  Left-sided temporal headache, will check ESR, give IV medications for headache and check labs and reevaluate  ESR normal.  Lab work otherwise unremarkable.  Patient had significant improvement with treatment, will discharge with Fioricet outpatient follow-up as needed    ____________________________________________   FINAL CLINICAL IMPRESSION(S) / ED DIAGNOSES  Final diagnoses:  Acute nonintractable headache, unspecified headache type        Note:  This document was prepared  using Systems analyst and may include unintentional dictation errors.    Lavonia Drafts, MD 04/21/17 [id number]

## 2017-04-21 NOTE — ED Triage Notes (Signed)
Patient presents to the ED with chronic headache x 2 months that is worse this morning and did not improve with tylenol.  Patient had a stroke 2 months ago that left him with left sided facial weakness and difficulty speaking.

## 2017-05-05 ENCOUNTER — Ambulatory Visit: Payer: Medicaid Other | Admitting: Gastroenterology

## 2017-05-05 ENCOUNTER — Ambulatory Visit: Payer: Self-pay | Admitting: Gastroenterology

## 2017-05-05 ENCOUNTER — Encounter: Payer: Self-pay | Admitting: Gastroenterology

## 2017-07-11 IMAGING — US US CAROTID DUPLEX BILAT
1 series · 13 of 24 positions shown · non-contrast
Comparison: CTA neck 03/23/2013

CLINICAL DATA: 54-year-old male with a history of cerebral vascular
accident.

Cardiovascular risk factors include hypertension, stroke/ TIA,
tobacco use, diabetes.
EXAM:
BILATERAL CAROTID DUPLEX ULTRASOUND
TECHNIQUE: Gray scale imaging, color Doppler and duplex ultrasound were
performed of bilateral carotid and vertebral arteries in the neck.

[Series 1: us carotid duplex bilat · 0.06mm/px · 13 of 62 slices shown]
[im 1/62]
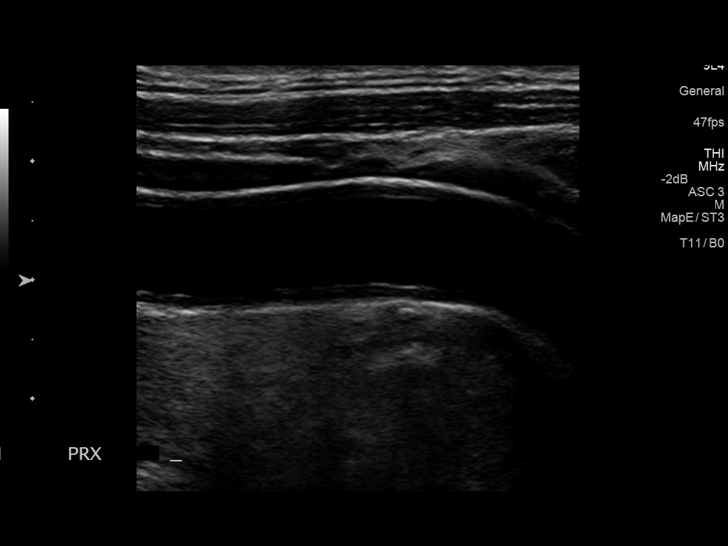
[im 6/62]
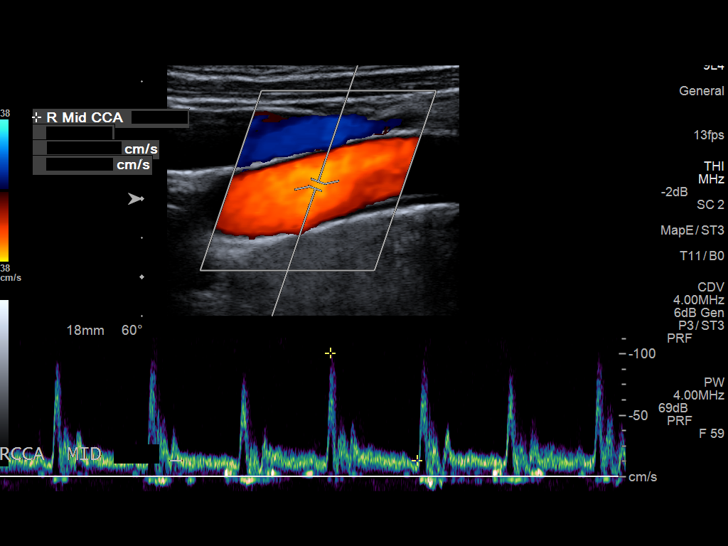
[im 11/62]
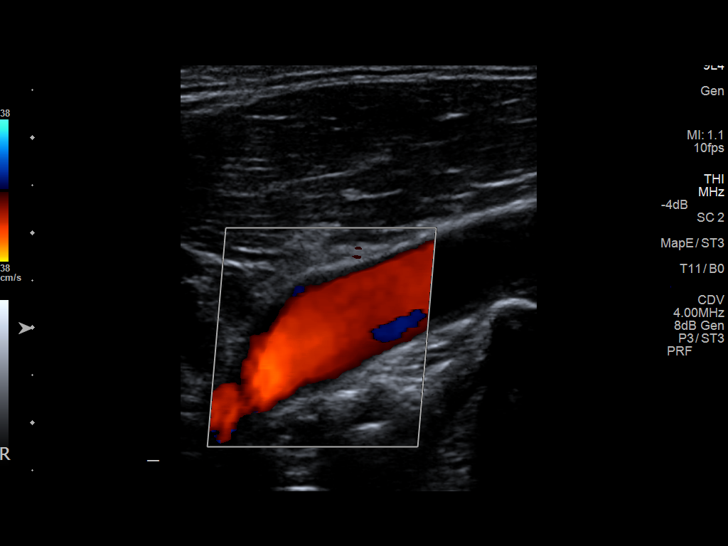
[im 16/62]
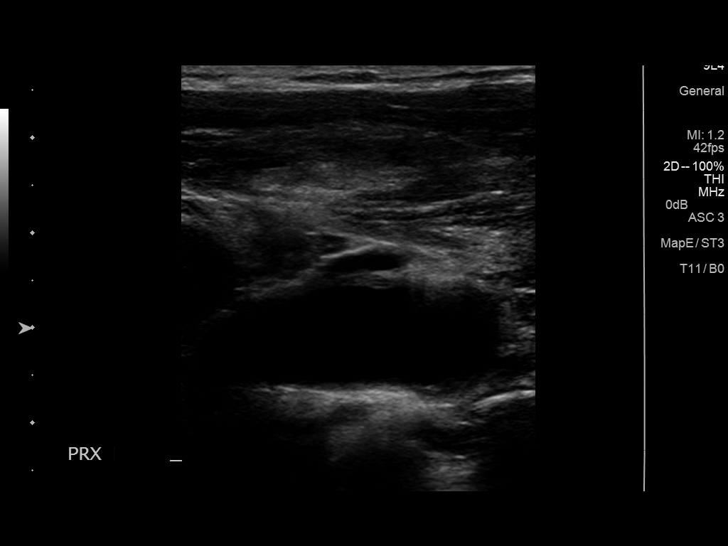
[im 22/62]
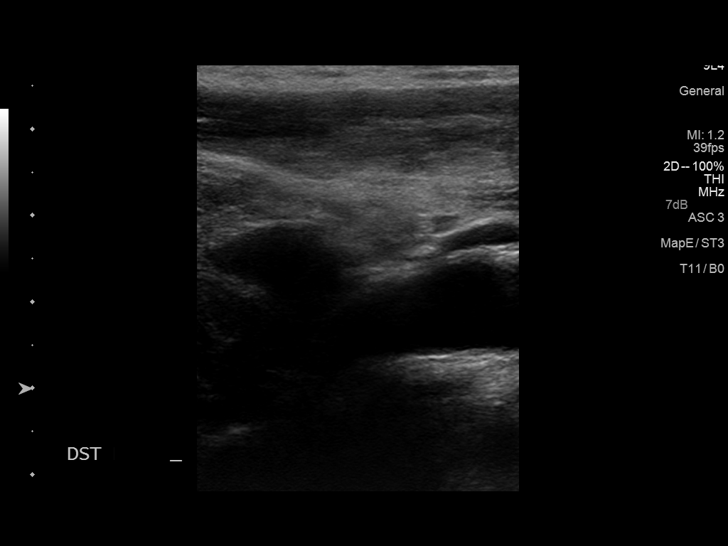
[im 27/62]
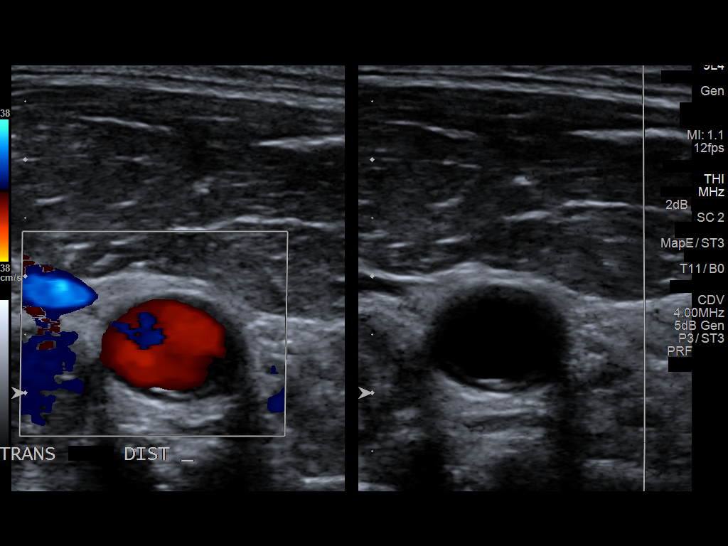
[im 32/62]
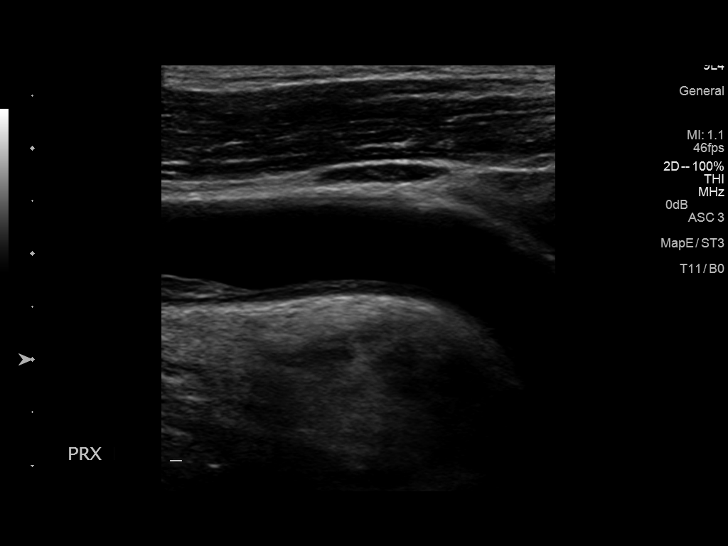
[im 35/62]
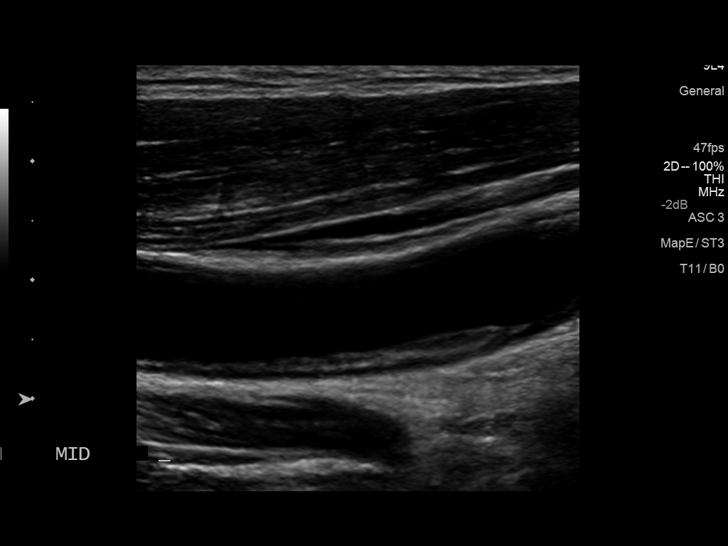
[im 40/62]
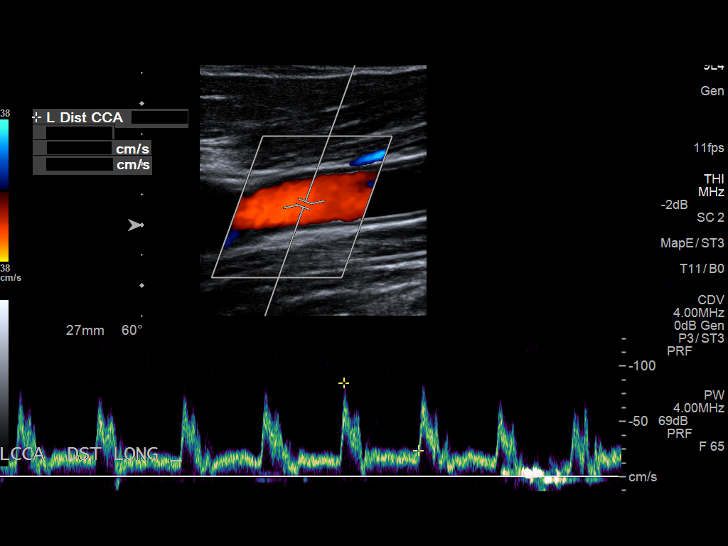
[im 46/62]
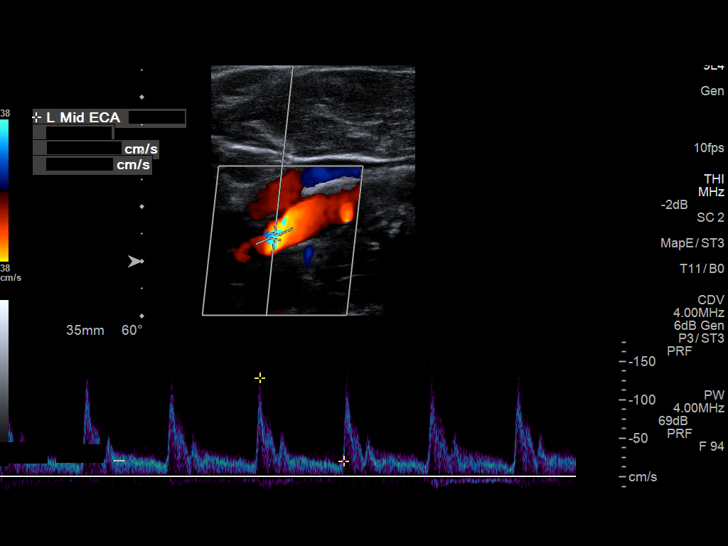
[im 51/62]
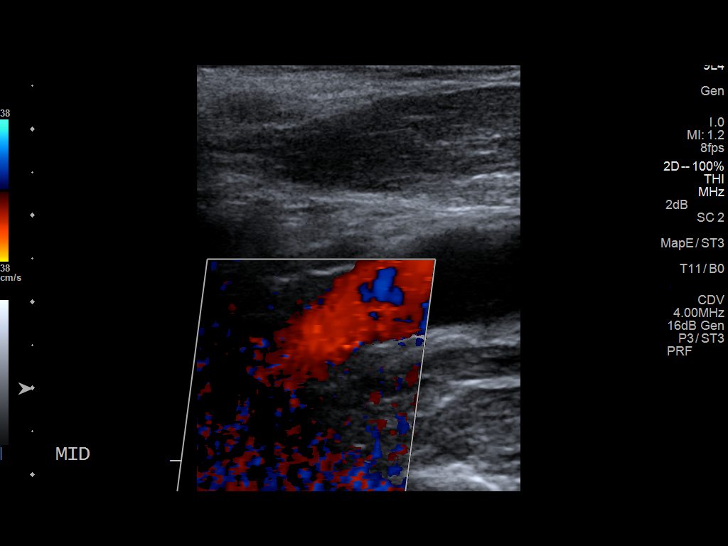
[im 56/62]
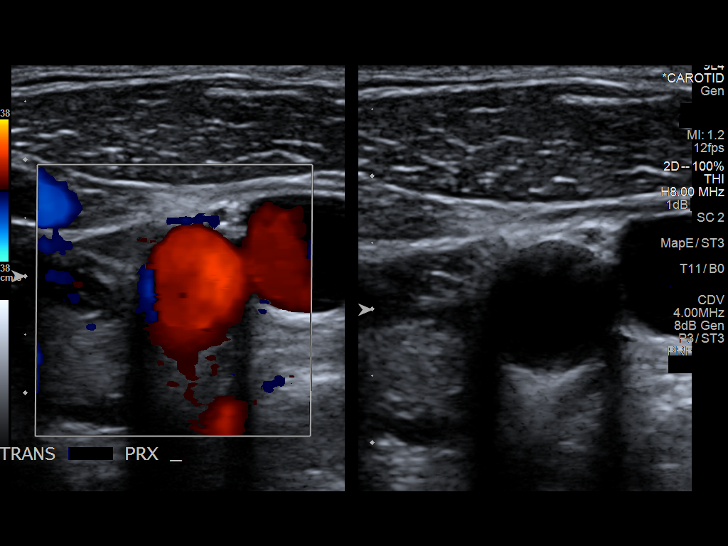
[im 62/62]
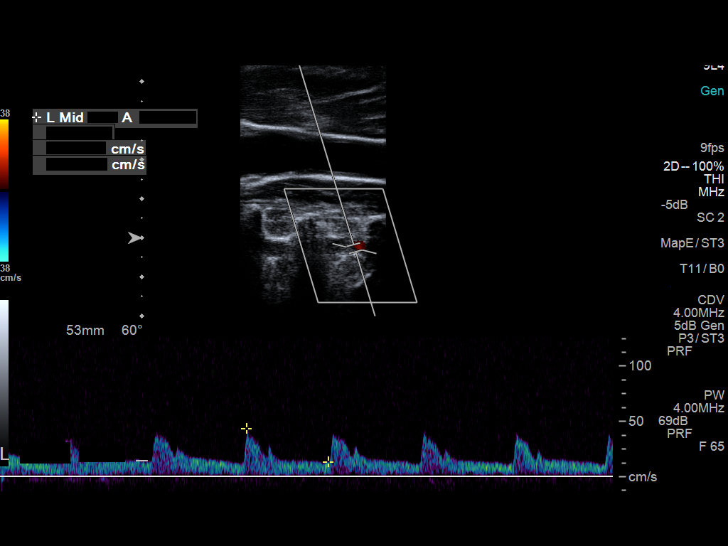

[13 of 24 positions shown; findings below may reference images not displayed]

FINDINGS: Criteria: Quantification of carotid stenosis is based on velocity
parameters that correlate the residual internal carotid diameter
with NASCET-based stenosis levels, using the diameter of the distal
internal carotid lumen as the denominator for stenosis measurement.

The following velocity measurements were obtained:

RIGHT

ICA:  Systolic 66 cm/sec, Diastolic 25 cm/sec

CCA:  101 cm/sec

SYSTOLIC ICA/CCA RATIO:

ECA:  130 cm/sec

LEFT

ICA:  Systolic 49 cm/sec, Diastolic 13 cm/sec

CCA:  95 cm/sec

SYSTOLIC ICA/CCA RATIO:

ECA:  129 cm/sec

Right Brachial SBP: Not acquired

Left Brachial SBP: Not acquired

RIGHT CAROTID ARTERY: No significant calcified disease of the right
common carotid artery. Intermediate waveform maintained.
Heterogeneous plaque without significant calcifications at the right
carotid bifurcation. Low resistance waveform of the right ICA. No
significant tortuosity.

RIGHT VERTEBRAL ARTERY: Antegrade flow with low resistance waveform.

LEFT CAROTID ARTERY: No significant calcified disease of the left
common carotid artery. Intermediate waveform maintained.
Heterogeneous plaque at the left carotid bifurcation without
significant calcifications. Low resistance waveform of the left ICA.

LEFT VERTEBRAL ARTERY:  Antegrade flow with low resistance waveform.
IMPRESSION: Color duplex indicates minimal heterogeneous plaque, with no
hemodynamically significant stenosis by duplex criteria in the
extracranial cerebrovascular circulation.

## 2017-09-13 ENCOUNTER — Emergency Department: Payer: Medicaid Other

## 2017-09-13 ENCOUNTER — Emergency Department
Admission: EM | Admit: 2017-09-13 | Discharge: 2017-09-13 | Disposition: A | Discharge status: Home / Self Care | Payer: Medicaid Other | Attending: Emergency Medicine | Admitting: Emergency Medicine

## 2017-09-13 ENCOUNTER — Encounter: Payer: Self-pay | Admitting: Emergency Medicine

## 2017-09-13 DIAGNOSIS — Z794 Long term (current) use of insulin: Secondary | ICD-10-CM | POA: Diagnosis not present

## 2017-09-13 DIAGNOSIS — Y92009 Unspecified place in unspecified non-institutional (private) residence as the place of occurrence of the external cause: Secondary | ICD-10-CM

## 2017-09-13 DIAGNOSIS — M5440 Lumbago with sciatica, unspecified side: Secondary | ICD-10-CM

## 2017-09-13 DIAGNOSIS — Z7982 Long term (current) use of aspirin: Secondary | ICD-10-CM | POA: Insufficient documentation

## 2017-09-13 DIAGNOSIS — I2510 Atherosclerotic heart disease of native coronary artery without angina pectoris: Secondary | ICD-10-CM | POA: Insufficient documentation

## 2017-09-13 DIAGNOSIS — Z79899 Other long term (current) drug therapy: Secondary | ICD-10-CM | POA: Diagnosis not present

## 2017-09-13 DIAGNOSIS — I110 Hypertensive heart disease with heart failure: Secondary | ICD-10-CM | POA: Diagnosis not present

## 2017-09-13 DIAGNOSIS — M545 Low back pain: Secondary | ICD-10-CM | POA: Diagnosis present

## 2017-09-13 DIAGNOSIS — Z7901 Long term (current) use of anticoagulants: Secondary | ICD-10-CM | POA: Insufficient documentation

## 2017-09-13 DIAGNOSIS — F17210 Nicotine dependence, cigarettes, uncomplicated: Secondary | ICD-10-CM | POA: Insufficient documentation

## 2017-09-13 DIAGNOSIS — E119 Type 2 diabetes mellitus without complications: Secondary | ICD-10-CM | POA: Diagnosis not present

## 2017-09-13 DIAGNOSIS — W19XXXA Unspecified fall, initial encounter: Secondary | ICD-10-CM

## 2017-09-13 DIAGNOSIS — I5032 Chronic diastolic (congestive) heart failure: Secondary | ICD-10-CM | POA: Insufficient documentation

## 2017-09-13 MED ORDER — CYCLOBENZAPRINE HCL 10 MG PO TABS: 10.0000 mg | ORAL_TABLET | Freq: Three times a day (TID) | ORAL | 0 refills | Status: AC | PRN

## 2017-09-13 MED ORDER — IBUPROFEN 600 MG PO TABS: 600.0000 mg | ORAL_TABLET | Freq: Three times a day (TID) | ORAL | 0 refills | Status: AC | PRN

## 2017-09-13 MED ORDER — CYCLOBENZAPRINE HCL 10 MG PO TABS
10.0000 mg | ORAL_TABLET | Freq: Once | ORAL | Status: AC
  Administered 2017-09-13: 10 mg via ORAL
  Filled 2017-09-13: qty 1

## 2017-09-13 MED ORDER — TRAMADOL HCL 50 MG PO TABS: 50.0000 mg | ORAL_TABLET | Freq: Two times a day (BID) | ORAL | 0 refills | Status: AC | PRN

## 2017-09-13 MED ORDER — TRAMADOL HCL 50 MG PO TABS
50.0000 mg | ORAL_TABLET | Freq: Once | ORAL | Status: AC
  Administered 2017-09-13: 50 mg via ORAL
  Filled 2017-09-13: qty 1

## 2017-09-13 NOTE — ED Triage Notes (Signed)
Brought in via ems s/p slip and fall this am  Hit head and back on tub  No LOC

## 2017-09-13 NOTE — ED Notes (Signed)
Patient given ED sandwich tray and diet Sprite. 

## 2017-09-13 NOTE — ED Provider Notes (Signed)
[facility] Emergency Department Provider Note   ____________________________________________   First MD Initiated Contact with Patient 09/13/17 [id number]     (approximate)  I have reviewed the triage vital signs and the nursing notes.   HISTORY  Chief Complaint Fall and Back Pain    HPI [name] Ramaj Frangos. is a 57 y.o. male patient arrived via EMS complaining of left temporal pain low back pain secondary to a fall.  Patient state he was getting up from the toilet when his back "gave out" and he fell hitting his left side of the head on the toe.  Patient denies headache or LOC.  Patient denies vertigo vision disturbance.  Patient denies radicular component to his back pain.  Patient denies bladder bowel dysfunction.  Patient rates his back pain as a 7/10.  No palates this measures prior to arrival.  Past Medical History:  Diagnosis Date  . Aortic stenosis, mild February 2015  . Auditory hallucinations   . Bipolar disorder (HCC)   . Coronary artery disease    No reported catheterization. Negative Myoview stress test and 2013.  Marland Kitchen Depression   . Diabetes mellitus   . GERD (gastroesophageal reflux disease)   . History of suicide attempt   . Hx of medication noncompliance   . Hypertension   . Hypertensive hypertrophic cardiomyopathy [facility]) February 2015   Severe concentric LVH on echocardiogram.  . Obesity, Class II, BMI 35-39.9, with comorbidity   . Pericarditis   . Polysubstance abuse (HCC)    cocaine, marijuana  . Stroke [facility]) February 2015   Right MCA aneurysm; head MRI - acute infarct of the right frontal lobe. Smaller acute infarct of the left frontal lobe. Thought to be due to cerebral emboli. Left PICA infarct. Small chronic lacunar infarct right internal capsule. Chronic microvascular ischemic change with white matter change.  . Syncope and collapse     Patient Active Problem List   Diagnosis Date Noted  . GI bleed 11/07/2016  . Accelerated  hypertension 06/07/2016  . GERD (gastroesophageal reflux disease) 06/07/2016  . Bipolar affective disorder (HCC) 06/07/2016  . Chest pain 06/07/2016  . Unspecified Depressive disorder 04/17/2016  . 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 Chronic diastolic CHF (congestive heart failure) (HCC) 10/02/2015  . Tobacco use disorder 11/07/2014  . Stimulant use disorder (HCC) crack cocaine 11/07/2014  . Alcohol use disorder, moderate, dependence (HCC) 11/07/2014  . CAD (coronary artery disease) 11/05/2014  . Cerebral infarction (HCC) 10/19/2014  . LVH (left ventricular hypertrophy) 05/20/2013  . Obesity (BMI 30-39.9) 04/10/2013  . Aortic stenosis, mild 03/07/2013  . DM (diabetes mellitus), type 2, uncontrolled (HCC) 01/05/2013  . Essential hypertension, malignant 01/25/2009    Past Surgical History:  Procedure Laterality Date  . Carotid Dopplers:  03/23/2013   Interval thickening of bilateral common carotid no significant plaque formation. Tortuosity but no significant stenosis. Patent bilateral vertebral  arteries  . NM MYOVIEW LTD  July 2013   No ischemia or infarct. There is inferior wall defec suggestive of diaphragmatic attenuation. Mild LV dilation at to be due to hypertensive heart disease. EF 55%. Poor exercise tolerance.  Marland Kitchen PERICARDIAL FLUID DRAINAGE    . TRANSTHORACIC ECHOCARDIOGRAM  03/23/2013   EF 5560%. Elevated left atrial pressure. Severe concentric LVH. Mild aortic stenosis. -- Appearance of hypertensive hypertrophic heart disease.  No clear cardioembolic source    Prior to Admission medications   Medication Sig Start Date End Date Taking? Authorizing Provider  amLODipine (  NORVASC) 10 MG tablet Take 1 tablet (10 mg total) by mouth daily. 04/17/16   Jimmy Footman, MD  aspirin 81 MG EC tablet Take 1 tablet (81 mg total) by mouth daily. 04/17/16   Jimmy Footman, MD  atorvastatin (LIPITOR) 20 MG tablet Take 1 tablet (20  mg total) by mouth daily at 6 PM. 04/17/16   Jimmy Footman, MD  busPIRone (BUSPAR) 10 MG tablet Take by mouth. 05/29/13   [provider]  butalbital-acetaminophen-caffeine Marikay Alar, ESGIC) 16-109-60 MG tablet Take 1-2 tablets by mouth every 6 (six) hours as needed for headache. 04/21/17 04/21/18  Jene Every, MD  chlorthalidone (HYGROTON) 25 MG tablet Take 1 tablet (25 mg total) by mouth daily. 04/17/16   Jimmy Footman, MD  cloNIDine (CATAPRES) 0.3 MG tablet Take by mouth.    [provider]  clopidogrel (PLAVIX) 75 MG tablet Take 1 tablet (75 mg total) by mouth daily. Patient not taking: Reported on 03/31/2017 04/17/16   Jimmy Footman, MD  cyclobenzaprine (FLEXERIL) 10 MG tablet Take 1 tablet (10 mg total) by mouth 3 (three) times daily as needed. 09/13/17   Joni Reining, PA-C  Diclofenac Sodium 3 % GEL Place onto the skin. 05/06/16   [provider]  doxepin (SINEQUAN) 10 MG capsule Take 10 mg by mouth daily.    [provider]  FLUARIX QUADRIVALENT 0.5 ML injection inject 0.5 milliliter intramuscularly 10/30/16   [provider]  ibuprofen (ADVIL,MOTRIN) 600 MG tablet Take 1 tablet (600 mg total) by mouth every 8 (eight) hours as needed. 09/13/17   Joni Reining, PA-C  insulin aspart (NOVOLOG) 100 UNIT/ML injection Inject 0-9 Units into the skin 3 (three) times daily with meals. 11/15/16   Katha Hamming, MD  insulin aspart (NOVOLOG) 100 UNIT/ML injection Inject 0-5 Units into the skin at bedtime. 11/15/16   Katha Hamming, MD  Insulin Glargine (LANTUS SOLOSTAR) 100 UNIT/ML Solostar Pen Inject into the skin.    [provider]  insulin regular (NOVOLIN R,HUMULIN R) 100 units/mL injection Inject into the skin.    [provider]  lisinopril (PRINIVIL,ZESTRIL) 40 MG tablet Take by mouth.    [provider]  metFORMIN (GLUCOPHAGE) 500 MG tablet Take one tablet by mouth twice a day  05/29/13   [provider]  metoprolol tartrate (LOPRESSOR) 25 MG tablet Take 1 tablet (25 mg total) by mouth 2 (two) times daily. 11/15/16   Katha Hamming, MD  naproxen (NAPROSYN) 500 MG tablet  10/30/16   [provider]  nitroGLYCERIN (NITROSTAT) 0.4 MG SL tablet Place 1 tablet (0.4 mg total) under the tongue every 5 (five) minutes as needed for chest pain. 04/17/16   Jimmy Footman, MD  oxyCODONE-acetaminophen (PERCOCET/ROXICET) 5-325 MG tablet take 1 tablet by mouth every 6 hours if needed for severe pain 09/24/16   [provider]  pantoprazole (PROTONIX) 40 MG tablet Take 1 tablet (40 mg total) by mouth 2 (two) times daily before a meal. Patient not taking: Reported on 03/31/2017 11/15/16   Katha Hamming, MD  sertraline (ZOLOFT) 50 MG tablet Take by mouth. 04/17/16   [provider]  thiamine 100 MG tablet Take 1 tablet (100 mg total) by mouth daily. 11/15/16   Katha Hamming, MD  traMADol (ULTRAM) 50 MG tablet Take 1 tablet (50 mg total) by mouth every 12 (twelve) hours as needed. 09/13/17   Joni Reining, PA-C  traZODone (DESYREL) 150 MG tablet Take 1 tablet (150 mg total) by mouth at bedtime. 04/17/16  Jimmy FootmanHernandez-Gonzalez, Andrea, MD    Allergies Patient has no known allergies.  Family History  Problem Relation Age of Onset  . Stroke Mother   . Diabetes type II Mother   . Hypertension Mother   . Hypertension Father   . Arthritis Father   . Arthritis Sister   . Diabetes type II Sister   . Hypertension Sister     Social History Social History   Tobacco Use  . Smoking status: Current Every Day Smoker    Packs/day: 0.50    Years: 40.00    Pack years: 20.00    Types: Cigars, Cigarettes  . Smokeless tobacco: Never Used  Substance Use Topics  . Alcohol use: No    Alcohol/week: 43.0 standard drinks    Types: 3 Glasses of wine, 40 Cans of beer per week    Comment: occasionally; reports 1 pint of vodka per month  at paydaysocially  . Drug use: Yes    Frequency: 1.0 times per week    Types: Marijuana, Cocaine    Comment: past use; denies current; UDS positive for cocaine    Review of Systems Constitutional: No fever/chills Eyes: No visual changes. ENT: No sore throat. Cardiovascular: Denies chest pain. Respiratory: Denies shortness of breath. Gastrointestinal: No abdominal pain.  No nausea, no vomiting.  No diarrhea.  No constipation. Genitourinary: Negative for dysuria. Musculoskeletal: Negative for back pain. Skin: Negative for rash. Neurological: Negative for headaches, focal weakness or numbness. Psychiatric:Bipolar, depression, and polysubstance abuse. Endocrine:Diabetes and hypertension.   ____________________________________________   PHYSICAL EXAM:  VITAL SIGNS: ED Triage Vitals [09/13/17 1006]  Enc Vitals Group     BP (!) 143/94     Pulse Rate 84     Resp 20     Temp 97.7 F (36.5 C)     Temp src      SpO2 97 %     Weight 245 lb (111.1 kg)     Height 5\' 11"  (1.803 m)     Head Circumference      Peak Flow      Pain Score 7     Pain Loc      Pain Edu?      Excl. in GC?     Constitutional: Alert and oriented. Well appearing and in no acute distress. Eyes: Conjunctivae are normal. PERRL. EOMI. Head: Atraumatic. Nose: No congestion/rhinnorhea. Mouth/Throat: Mucous membranes are moist.  Oropharynx non-erythematous. Neck: No stridor. Hematological/Lymphatic/ImmunilogicalNo cervical lymphadenopathy. Cardiovascular: Normal rate, regular rhythm. Grossly normal heart sounds.  Good peripheral circulation. Respiratory: Normal respiratory effort.  No retractions. Lungs CTAB. Gastrointestinal: Soft and nontender. No distention. No abdominal bruits. No CVA tenderness. Musculoskeletal: No obvious deformity to the lumbar spine.  Patient had moderate guarding palpation of L4-S1.  Lower extremity tenderness nor edema.  No joint effusions.  Patient initially had a left straight  leg test.  Approximately 2 hours later repeat exam reveals negative straight leg test.   Neurologic:  Normal speech and language. No gross focal neurologic deficits are appreciated. No gait instability. Skin:  Skin is warm, dry and intact. No rash noted. Psychiatric: Mood and affect are normal. Speech and behavior are normal.  ____________________________________________   LABS (all labs ordered are listed, but only abnormal results are displayed)  Labs Reviewed - No data to display ____________________________________________  EKG   ____________________________________________  RADIOLOGY  ED MD interpretation:  Official radiology report(s): Dg Lumbar Spine Complete  Result Date: 09/13/2017 CLINICAL DATA:  Pain after fall. EXAM: LUMBAR SPINE -  COMPLETE 4+ VIEW COMPARISON:  CT scan September 10, 2013 FINDINGS: Minimal degenerative disc disease. Lower lumbar facet degenerative changes. No fracture or traumatic malalignment. IMPRESSION: No acute abnormalities. Electronically Signed   By: Gerome Sam III M.D   On: 09/13/2017 10:52   Ct Lumbar Spine Wo Contrast  Result Date: 09/13/2017 CLINICAL DATA:  Thoracic and lumbar spine trauma. EXAM: CT LUMBAR SPINE WITHOUT CONTRAST TECHNIQUE: Multidetector CT imaging of the lumbar spine was performed without intravenous contrast administration. Multiplanar CT image reconstructions were also generated. COMPARISON:  Chest abdomen and pelvis CT 09/10/2013 FINDINGS: Segmentation: When numbered from above there is a transitional S1 vertebra with articulation between the left transverse process and sacrum that shows spurring and impingement on the left S1 nerve root. Alignment: Normal Vertebrae: No acute fracture or focal pathologic process. Paraspinal and other soft tissues: Atherosclerotic calcification. Disc levels: Degenerative facet spurring at L4-5 and L5-S1. Facet degeneration at L5-S1 is fairly advanced with subchondral irregularity and hypertrophy.  L5-S1 disc narrowing and bulging. IMPRESSION: 1. No acute finding. 2. Transitional S1 vertebra with hypertrophic articulation between the left transverse process and sacrum impinging on the left S1 nerve root. 3. L4-5 and L5-S1 facet degeneration.  L5-S1 disc degeneration. Electronically Signed   By: Marnee Spring M.D.   On: 09/13/2017 12:22   Mr Lumbar Spine Wo Contrast  Result Date: 09/13/2017 CLINICAL DATA:  Back pain after minor trauma EXAM: MRI LUMBAR SPINE WITHOUT CONTRAST TECHNIQUE: Multiplanar, multisequence MR imaging of the lumbar spine was performed. No intravenous contrast was administered. COMPARISON:  CT from earlier today FINDINGS: Segmentation: Transitional S1 vertebra based on numbering from above on a 2015 chest abdomen pelvis CT. Alignment:  Normal Vertebrae:  No fracture, evidence of discitis, or bone lesion. Conus medullaris and cauda equina: Conus extends to the L1 level. Conus and cauda equina appear normal. Paraspinal and other soft tissues: Negative. No evidence soft tissue injury. Disc levels: L4-L5: Mild facet spurring and early annulus bulging. No impingement L5-S1:Facet degeneration with joint effusions in the gaping joints. Facet hypertrophy and spurring. The disc is narrowed and bulging with moderate bilateral foraminal narrowing. Spinal canal is patent. S1-2: Hypertrophic junction between the left transverse process and sacrum impinging on the left S1 nerve root. IMPRESSION: 1. No new or acute finding when compared to preceding CT. No occult fracture. 2. L4-5 and L5-S1 facet degeneration. 3. L5-S1 disc degeneration with moderate bilateral foraminal narrowing. 4. Transitional S1 vertebra with hypertrophic articulation between the left transverse process and sacrum impinging on the left S1 nerve root. Electronically Signed   By: Marnee Spring M.D.   On: 09/13/2017 15:09    ____________________________________________   PROCEDURES  Procedure(s) performed:    Procedures  Critical Care performed:   ____________________________________________   INITIAL IMPRESSION / ASSESSMENT AND PLAN / ED COURSE  As part of my medical decision making, I reviewed the following data within the electronic MEDICAL RECORD NUMBER    Patient arrived via EMS with inability to stand secondary to a fall at home.  Imaging to include x-rays, CT, MRI of the neck consistent with complaint.  Status post pain medication and muscle relaxer patient was able to stand but had limited mobility with walking.  Discussed x-ray findings with patient.  Patient given discharge care instruction.  Patient will follow-up PCP but return back to ED if condition worsens.      ____________________________________________   FINAL CLINICAL IMPRESSION(S) / ED DIAGNOSES  Final diagnoses:  Fall in home, initial encounter  Acute left-sided low back pain with sciatica, sciatica laterality unspecified     ED Discharge Orders         Ordered    cyclobenzaprine (FLEXERIL) 10 MG tablet  3 times daily PRN     09/13/17 1537    ibuprofen (ADVIL,MOTRIN) 600 MG tablet  Every 8 hours PRN     09/13/17 1537    traMADol (ULTRAM) 50 MG tablet  Every 12 hours PRN     09/13/17 1537           Note:  This document was prepared using Dragon voice recognition software and may include unintentional dictation errors.    Joni Reining, PA-C 09/13/17 1745    Sharman Cheek, MD 09/29/17 [phone]

## 2017-09-13 NOTE — ED Notes (Signed)
Taken to MRI via stretcher 

## 2017-09-13 NOTE — Discharge Instructions (Signed)
Take medications as directed follow-up family doctor in 3 to 5 days if no improvement.

## 2017-12-20 ENCOUNTER — Other Ambulatory Visit: Payer: Self-pay

## 2017-12-20 ENCOUNTER — Emergency Department: Payer: Medicaid Other

## 2017-12-20 ENCOUNTER — Inpatient Hospital Stay: Payer: Medicaid Other

## 2017-12-20 ENCOUNTER — Inpatient Hospital Stay
Admission: EM | Admit: 2017-12-20 | Discharge: 2018-01-04 | DRG: 064 | Disposition: E | Payer: Medicaid Other | Attending: Internal Medicine | Admitting: Internal Medicine

## 2017-12-20 DIAGNOSIS — Z7189 Other specified counseling: Secondary | ICD-10-CM | POA: Diagnosis not present

## 2017-12-20 DIAGNOSIS — I639 Cerebral infarction, unspecified: Secondary | ICD-10-CM

## 2017-12-20 DIAGNOSIS — Z823 Family history of stroke: Secondary | ICD-10-CM

## 2017-12-20 DIAGNOSIS — Z9119 Patient's noncompliance with other medical treatment and regimen: Secondary | ICD-10-CM

## 2017-12-20 DIAGNOSIS — Z6833 Body mass index (BMI) 33.0-33.9, adult: Secondary | ICD-10-CM

## 2017-12-20 DIAGNOSIS — G463 Brain stem stroke syndrome: Secondary | ICD-10-CM | POA: Diagnosis not present

## 2017-12-20 DIAGNOSIS — I2510 Atherosclerotic heart disease of native coronary artery without angina pectoris: Secondary | ICD-10-CM | POA: Diagnosis present

## 2017-12-20 DIAGNOSIS — I248 Other forms of acute ischemic heart disease: Secondary | ICD-10-CM | POA: Diagnosis present

## 2017-12-20 DIAGNOSIS — N390 Urinary tract infection, site not specified: Secondary | ICD-10-CM

## 2017-12-20 DIAGNOSIS — Z7984 Long term (current) use of oral hypoglycemic drugs: Secondary | ICD-10-CM | POA: Diagnosis not present

## 2017-12-20 DIAGNOSIS — J690 Pneumonitis due to inhalation of food and vomit: Secondary | ICD-10-CM | POA: Diagnosis present

## 2017-12-20 DIAGNOSIS — Z8249 Family history of ischemic heart disease and other diseases of the circulatory system: Secondary | ICD-10-CM

## 2017-12-20 DIAGNOSIS — Z515 Encounter for palliative care: Secondary | ICD-10-CM | POA: Diagnosis not present

## 2017-12-20 DIAGNOSIS — Z8261 Family history of arthritis: Secondary | ICD-10-CM

## 2017-12-20 DIAGNOSIS — E1151 Type 2 diabetes mellitus with diabetic peripheral angiopathy without gangrene: Secondary | ICD-10-CM | POA: Diagnosis present

## 2017-12-20 DIAGNOSIS — R339 Retention of urine, unspecified: Secondary | ICD-10-CM | POA: Diagnosis present

## 2017-12-20 DIAGNOSIS — Z79899 Other long term (current) drug therapy: Secondary | ICD-10-CM | POA: Diagnosis not present

## 2017-12-20 DIAGNOSIS — R270 Ataxia, unspecified: Secondary | ICD-10-CM | POA: Diagnosis present

## 2017-12-20 DIAGNOSIS — K219 Gastro-esophageal reflux disease without esophagitis: Secondary | ICD-10-CM | POA: Diagnosis present

## 2017-12-20 DIAGNOSIS — G9341 Metabolic encephalopathy: Secondary | ICD-10-CM | POA: Diagnosis present

## 2017-12-20 DIAGNOSIS — F319 Bipolar disorder, unspecified: Secondary | ICD-10-CM | POA: Diagnosis present

## 2017-12-20 DIAGNOSIS — R1319 Other dysphagia: Secondary | ICD-10-CM

## 2017-12-20 DIAGNOSIS — F17210 Nicotine dependence, cigarettes, uncomplicated: Secondary | ICD-10-CM | POA: Diagnosis present

## 2017-12-20 DIAGNOSIS — Z794 Long term (current) use of insulin: Secondary | ICD-10-CM

## 2017-12-20 DIAGNOSIS — E669 Obesity, unspecified: Secondary | ICD-10-CM | POA: Diagnosis present

## 2017-12-20 DIAGNOSIS — I69354 Hemiplegia and hemiparesis following cerebral infarction affecting left non-dominant side: Secondary | ICD-10-CM | POA: Diagnosis not present

## 2017-12-20 DIAGNOSIS — I119 Hypertensive heart disease without heart failure: Secondary | ICD-10-CM | POA: Diagnosis present

## 2017-12-20 DIAGNOSIS — Z833 Family history of diabetes mellitus: Secondary | ICD-10-CM | POA: Diagnosis not present

## 2017-12-20 DIAGNOSIS — R262 Difficulty in walking, not elsewhere classified: Secondary | ICD-10-CM | POA: Diagnosis not present

## 2017-12-20 DIAGNOSIS — R471 Dysarthria and anarthria: Secondary | ICD-10-CM | POA: Diagnosis present

## 2017-12-20 DIAGNOSIS — Z66 Do not resuscitate: Secondary | ICD-10-CM

## 2017-12-20 DIAGNOSIS — E870 Hyperosmolality and hypernatremia: Secondary | ICD-10-CM | POA: Diagnosis not present

## 2017-12-20 DIAGNOSIS — R069 Unspecified abnormalities of breathing: Secondary | ICD-10-CM

## 2017-12-20 DIAGNOSIS — I350 Nonrheumatic aortic (valve) stenosis: Secondary | ICD-10-CM | POA: Diagnosis present

## 2017-12-20 DIAGNOSIS — R29706 NIHSS score 6: Secondary | ICD-10-CM | POA: Diagnosis present

## 2017-12-20 DIAGNOSIS — R1310 Dysphagia, unspecified: Secondary | ICD-10-CM | POA: Diagnosis present

## 2017-12-20 DIAGNOSIS — J9601 Acute respiratory failure with hypoxia: Secondary | ICD-10-CM | POA: Diagnosis present

## 2017-12-20 DIAGNOSIS — I63511 Cerebral infarction due to unspecified occlusion or stenosis of right middle cerebral artery: Secondary | ICD-10-CM | POA: Diagnosis not present

## 2017-12-20 DIAGNOSIS — Z7902 Long term (current) use of antithrombotics/antiplatelets: Secondary | ICD-10-CM

## 2017-12-20 DIAGNOSIS — I69322 Dysarthria following cerebral infarction: Secondary | ICD-10-CM

## 2017-12-20 DIAGNOSIS — E785 Hyperlipidemia, unspecified: Secondary | ICD-10-CM | POA: Diagnosis present

## 2017-12-20 DIAGNOSIS — Z7982 Long term (current) use of aspirin: Secondary | ICD-10-CM

## 2017-12-20 DIAGNOSIS — I69393 Ataxia following cerebral infarction: Secondary | ICD-10-CM

## 2017-12-20 DIAGNOSIS — Z915 Personal history of self-harm: Secondary | ICD-10-CM | POA: Diagnosis not present

## 2017-12-20 DIAGNOSIS — Z4659 Encounter for fitting and adjustment of other gastrointestinal appliance and device: Secondary | ICD-10-CM

## 2017-12-20 DIAGNOSIS — F05 Delirium due to known physiological condition: Secondary | ICD-10-CM | POA: Diagnosis not present

## 2017-12-20 DIAGNOSIS — R0600 Dyspnea, unspecified: Secondary | ICD-10-CM

## 2017-12-20 DIAGNOSIS — R531 Weakness: Secondary | ICD-10-CM | POA: Diagnosis not present

## 2017-12-20 DIAGNOSIS — I351 Nonrheumatic aortic (valve) insufficiency: Secondary | ICD-10-CM | POA: Diagnosis not present

## 2017-12-20 DIAGNOSIS — I422 Other hypertrophic cardiomyopathy: Secondary | ICD-10-CM | POA: Diagnosis present

## 2017-12-20 DIAGNOSIS — F332 Major depressive disorder, recurrent severe without psychotic features: Secondary | ICD-10-CM

## 2017-12-20 DIAGNOSIS — R410 Disorientation, unspecified: Secondary | ICD-10-CM

## 2017-12-20 DIAGNOSIS — T17908A Unspecified foreign body in respiratory tract, part unspecified causing other injury, initial encounter: Secondary | ICD-10-CM

## 2017-12-20 DIAGNOSIS — R29810 Facial weakness: Secondary | ICD-10-CM | POA: Diagnosis present

## 2017-12-20 LAB — URINALYSIS, COMPLETE (UACMP) WITH MICROSCOPIC
Bilirubin Urine: NEGATIVE
Hgb urine dipstick: NEGATIVE
Ketones, ur: NEGATIVE mg/dL
Leukocytes, UA: NEGATIVE
NITRITE: NEGATIVE
PROTEIN: 30 mg/dL — AB
SPECIFIC GRAVITY, URINE: 1.026 (ref 1.005–1.030)
Squamous Epithelial / LPF: NONE SEEN (ref 0–5)
pH: 5 (ref 5.0–8.0)

## 2017-12-20 LAB — CBC
HCT: 45.4 % (ref 39.0–52.0)
HEMOGLOBIN: 15.3 g/dL (ref 13.0–17.0)
MCH: 29.6 pg (ref 26.0–34.0)
MCHC: 33.7 g/dL (ref 30.0–36.0)
MCV: 87.8 fL (ref 80.0–100.0)
Platelets: 243 10*3/uL (ref 150–400)
RBC: 5.17 MIL/uL (ref 4.22–5.81)
RDW: 12.9 % (ref 11.5–15.5)
WBC: 10.7 10*3/uL — AB (ref 4.0–10.5)
nRBC: 0 % (ref 0.0–0.2)

## 2017-12-20 LAB — BASIC METABOLIC PANEL
Anion gap: 10 (ref 5–15)
BUN: 20 mg/dL (ref 6–20)
CO2: 23 mmol/L (ref 22–32)
CREATININE: 1.16 mg/dL (ref 0.61–1.24)
Calcium: 9.3 mg/dL (ref 8.9–10.3)
Chloride: 107 mmol/L (ref 98–111)
GFR calc Af Amer: >60 mL/min (ref >60)
GLUCOSE: 200 mg/dL — AB (ref 70–99)
POTASSIUM: 4 mmol/L (ref 3.5–5.1)
SODIUM: 140 mmol/L (ref 135–145)

## 2017-12-20 LAB — TROPONIN I
TROPONIN I: 0.04 ng/mL — AB (ref <0.03)
TROPONIN I: 0.04 ng/mL — AB (ref <0.03)
TROPONIN I: 0.04 ng/mL — AB (ref <0.03)
Troponin I: 0.04 ng/mL (ref <0.03)

## 2017-12-20 LAB — INFLUENZA PANEL BY PCR (TYPE A & B)
INFLBPCR: NEGATIVE
Influenza A By PCR: NEGATIVE

## 2017-12-20 LAB — GLUCOSE, CAPILLARY: GLUCOSE-CAPILLARY: 134 mg/dL — AB (ref 70–99)

## 2017-12-20 MED ORDER — HYDRALAZINE HCL 20 MG/ML IJ SOLN
10.0000 mg | Freq: Four times a day (QID) | INTRAMUSCULAR | Status: DC | PRN
  Administered 2017-12-22 (×3): 10 mg via INTRAVENOUS
  Filled 2017-12-20 (×5): qty 1

## 2017-12-20 MED ORDER — MORPHINE SULFATE (PF) 4 MG/ML IV SOLN
INTRAVENOUS | Status: AC
  Filled 2017-12-20: qty 1

## 2017-12-20 MED ORDER — IPRATROPIUM-ALBUTEROL 0.5-2.5 (3) MG/3ML IN SOLN
3.0000 mL | Freq: Four times a day (QID) | RESPIRATORY_TRACT | Status: DC
  Administered 2017-12-20 – 2017-12-21 (×2): 3 mL via RESPIRATORY_TRACT
  Filled 2017-12-20 (×2): qty 3

## 2017-12-20 MED ORDER — AMLODIPINE BESYLATE 10 MG PO TABS
10.0000 mg | ORAL_TABLET | Freq: Every day | ORAL | Status: DC
  Administered 2017-12-21: 08:00:00 10 mg via ORAL
  Filled 2017-12-20: qty 1

## 2017-12-20 MED ORDER — IOHEXOL 300 MG/ML  SOLN
100.0000 mL | Freq: Once | INTRAMUSCULAR | Status: AC | PRN
  Administered 2017-12-20: 100 mL via INTRAVENOUS

## 2017-12-20 MED ORDER — IPRATROPIUM-ALBUTEROL 0.5-2.5 (3) MG/3ML IN SOLN
3.0000 mL | RESPIRATORY_TRACT | Status: AC
  Administered 2017-12-20: 3 mL via RESPIRATORY_TRACT

## 2017-12-20 MED ORDER — ONDANSETRON HCL 4 MG/2ML IJ SOLN
4.0000 mg | Freq: Once | INTRAMUSCULAR | Status: AC
  Administered 2017-12-20: 4 mg via INTRAVENOUS

## 2017-12-20 MED ORDER — METHYLPREDNISOLONE SODIUM SUCC 40 MG IJ SOLR
40.0000 mg | Freq: Four times a day (QID) | INTRAMUSCULAR | Status: DC
  Administered 2017-12-20 – 2017-12-22 (×8): 40 mg via INTRAVENOUS
  Filled 2017-12-20 (×8): qty 1

## 2017-12-20 MED ORDER — ACETAMINOPHEN 650 MG RE SUPP
650.0000 mg | Freq: Four times a day (QID) | RECTAL | Status: DC | PRN
  Administered 2017-12-26: 650 mg via RECTAL
  Filled 2017-12-20: qty 1

## 2017-12-20 MED ORDER — BUDESONIDE 0.5 MG/2ML IN SUSP
0.5000 mg | Freq: Two times a day (BID) | RESPIRATORY_TRACT | Status: DC
  Administered 2017-12-20 – 2017-12-26 (×11): 0.5 mg via RESPIRATORY_TRACT
  Filled 2017-12-20 (×13): qty 2

## 2017-12-20 MED ORDER — VITAMIN B-1 100 MG PO TABS
100.0000 mg | ORAL_TABLET | Freq: Every day | ORAL | Status: DC
  Administered 2017-12-21: 08:00:00 100 mg via ORAL
  Filled 2017-12-20: qty 1

## 2017-12-20 MED ORDER — INSULIN ASPART 100 UNIT/ML ~~LOC~~ SOLN: 0.0000 [IU] | Freq: Every day | SUBCUTANEOUS | Status: DC

## 2017-12-20 MED ORDER — ONDANSETRON HCL 4 MG/2ML IJ SOLN: 4.0000 mg | Freq: Four times a day (QID) | INTRAMUSCULAR | Status: DC | PRN

## 2017-12-20 MED ORDER — CLONIDINE HCL 0.1 MG/24HR TD PTWK
0.1000 mg | MEDICATED_PATCH | TRANSDERMAL | Status: DC
  Administered 2017-12-20: 22:00:00 0.1 mg via TRANSDERMAL
  Filled 2017-12-20: qty 1

## 2017-12-20 MED ORDER — SODIUM CHLORIDE 0.9 % IV SOLN
INTRAVENOUS | Status: DC
  Administered 2017-12-20 – 2017-12-24 (×6): via INTRAVENOUS

## 2017-12-20 MED ORDER — ASPIRIN EC 81 MG PO TBEC
81.0000 mg | DELAYED_RELEASE_TABLET | Freq: Every day | ORAL | Status: DC
  Administered 2017-12-21: 08:00:00 81 mg via ORAL
  Filled 2017-12-20: qty 1

## 2017-12-20 MED ORDER — IPRATROPIUM-ALBUTEROL 0.5-2.5 (3) MG/3ML IN SOLN
RESPIRATORY_TRACT | Status: AC
  Filled 2017-12-20: qty 3

## 2017-12-20 MED ORDER — SODIUM CHLORIDE 0.9 % IV SOLN
3.0000 g | Freq: Four times a day (QID) | INTRAVENOUS | Status: AC
  Administered 2017-12-20 – 2017-12-26 (×24): 3 g via INTRAVENOUS
  Filled 2017-12-20 (×33): qty 3

## 2017-12-20 MED ORDER — ONDANSETRON HCL 4 MG PO TABS: 4.0000 mg | ORAL_TABLET | Freq: Four times a day (QID) | ORAL | Status: DC | PRN

## 2017-12-20 MED ORDER — ENOXAPARIN SODIUM 40 MG/0.4ML ~~LOC~~ SOLN
40.0000 mg | SUBCUTANEOUS | Status: DC
  Administered 2017-12-20 – 2017-12-26 (×7): 40 mg via SUBCUTANEOUS
  Filled 2017-12-20 (×7): qty 0.4

## 2017-12-20 MED ORDER — ORAL CARE MOUTH RINSE
15.0000 mL | Freq: Two times a day (BID) | OROMUCOSAL | Status: DC
  Administered 2017-12-21 – 2017-12-26 (×11): 15 mL via OROMUCOSAL

## 2017-12-20 MED ORDER — ACETAMINOPHEN 325 MG PO TABS: 650.0000 mg | ORAL_TABLET | Freq: Four times a day (QID) | ORAL | Status: DC | PRN

## 2017-12-20 MED ORDER — MORPHINE SULFATE (PF) 4 MG/ML IV SOLN
4.0000 mg | Freq: Once | INTRAVENOUS | Status: AC
  Administered 2017-12-20: 4 mg via INTRAVENOUS

## 2017-12-20 MED ORDER — TRAMADOL HCL 50 MG PO TABS: 50.0000 mg | ORAL_TABLET | Freq: Two times a day (BID) | ORAL | Status: DC | PRN

## 2017-12-20 MED ORDER — ATORVASTATIN CALCIUM 20 MG PO TABS
20.0000 mg | ORAL_TABLET | Freq: Every day | ORAL | Status: DC
  Filled 2017-12-20 (×2): qty 1

## 2017-12-20 MED ORDER — INSULIN ASPART 100 UNIT/ML ~~LOC~~ SOLN
0.0000 [IU] | Freq: Three times a day (TID) | SUBCUTANEOUS | Status: DC
  Administered 2017-12-20: 18:00:00 3 [IU] via SUBCUTANEOUS
  Administered 2017-12-21 – 2017-12-22 (×5): 4 [IU] via SUBCUTANEOUS
  Administered 2017-12-23 – 2017-12-25 (×4): 3 [IU] via SUBCUTANEOUS
  Administered 2017-12-25 – 2017-12-26 (×4): 4 [IU] via SUBCUTANEOUS
  Filled 2017-12-20 (×14): qty 1

## 2017-12-20 MED ORDER — SODIUM CHLORIDE 0.9 % IV SOLN
1.0000 g | Freq: Once | INTRAVENOUS | Status: AC
  Administered 2017-12-20: 1 g via INTRAVENOUS
  Filled 2017-12-20: qty 10

## 2017-12-20 MED ORDER — ONDANSETRON HCL 4 MG/2ML IJ SOLN
INTRAMUSCULAR | Status: AC
  Administered 2017-12-20: 4 mg via INTRAVENOUS
  Filled 2017-12-20: qty 2

## 2017-12-20 MED ORDER — OXYCODONE-ACETAMINOPHEN 5-325 MG PO TABS: 1.0000 | ORAL_TABLET | Freq: Four times a day (QID) | ORAL | Status: DC | PRN

## 2017-12-20 NOTE — H&P (Signed)
[facility] - [place] [facility]    PATIENT NAME: [name] [name]    MR#:  [id number]  DATE OF BIRTH:  1960-05-07  DATE OF ADMISSION:  2018-01-12  PRIMARY CARE PHYSICIAN: Neita Goodnight Presley Raddle, MD   REQUESTING/REFERRING PHYSICIAN: Dr. Marisa Severin  CHIEF COMPLAINT:   Chief Complaint  Patient presents with  . Weakness    HISTORY OF PRESENT ILLNESS:  [name] [name]  is a 57 y.o. male with a known history of previous CVA, aortic stenosis, bipolar disorder, diabetes, GERD, medical noncompliance, polysubstance abuse who presents to the hospital from home due to weakness, difficulty ambulating.  Patient presently is a poor historian given his respiratory distress as he has aspirated while here in the emergency room.  Most of the history obtained from the family at bedside and from the ER physician.  Patient has a previous history of stroke and usually is able to use a walker and get around but today was having significant weakness and could not ambulate and fell to the ground.  He was found by his neighbor on the floor and therefore EMS was called and he was brought to the hospital.  Patient is not on a specialized diet since his previous CVA but while in the ER he attempted to eat a sandwich and aspirated and now was having significant respiratory distress.  Patient also had an episode of vomiting which was nonbloody and bilious in nature in the ER earlier.  Patient's initial urinalysis was suspected to be positive for UTI and he had significant urinary retention and had a Foley catheter placed with 1200 cc of urine drained.  Given his multiple issues hospitalist services were contacted for admission.  PAST MEDICAL HISTORY:   Past Medical History:  Diagnosis Date  . Aortic stenosis, mild February 2015  . Auditory hallucinations   . Bipolar disorder (HCC)   . Coronary artery disease    No reported catheterization. Negative Myoview stress test and 2013.  Marland Kitchen Depression   .  Diabetes mellitus   . GERD (gastroesophageal reflux disease)   . History of suicide attempt   . Hx of medication noncompliance   . Hypertension   . Hypertensive hypertrophic cardiomyopathy [facility]) February 2015   Severe concentric LVH on echocardiogram.  . Obesity, Class II, BMI 35-39.9, with comorbidity   . Pericarditis   . Polysubstance abuse (HCC)    cocaine, marijuana  . Stroke [facility]) February 2015   Right MCA aneurysm; head MRI - acute infarct of the right frontal lobe. Smaller acute infarct of the left frontal lobe. Thought to be due to cerebral emboli. Left PICA infarct. Small chronic lacunar infarct right internal capsule. Chronic microvascular ischemic change with white matter change.  . Syncope and collapse     PAST SURGICAL HISTORY:   Past Surgical History:  Procedure Laterality Date  . Carotid Dopplers:  03/23/2013   Interval thickening of bilateral common carotid no significant plaque formation. Tortuosity but no significant stenosis. Patent bilateral vertebral  arteries  . NM MYOVIEW LTD  July 2013   No ischemia or infarct. There is inferior wall defec suggestive of diaphragmatic attenuation. Mild LV dilation at to be due to hypertensive heart disease. EF 55%. Poor exercise tolerance.  Marland Kitchen PERICARDIAL FLUID DRAINAGE    . TRANSTHORACIC ECHOCARDIOGRAM  03/23/2013   EF 5560%. Elevated left atrial pressure. Severe concentric LVH. Mild aortic stenosis. -- Appearance of hypertensive hypertrophic heart disease.  No clear cardioembolic source    SOCIAL HISTORY:   Social  History   Tobacco Use  . Smoking status: Current Every Day Smoker    Packs/day: 1.00    Years: 40.00    Pack years: 40.00    Types: Cigars, Cigarettes  . Smokeless tobacco: Never Used  Substance Use Topics  . Alcohol use: No    Alcohol/week: 43.0 standard drinks    Types: 3 Glasses of wine, 40 Cans of beer per week    Comment: occasionally; reports 1 pint of vodka per month at paydaysocially    FAMILY  HISTORY:   Family History  Problem Relation Age of Onset  . Stroke Mother   . Diabetes type II Mother   . Hypertension Mother   . Hypertension Father   . Arthritis Father   . Arthritis Sister   . Diabetes type II Sister   . Hypertension Sister     DRUG ALLERGIES:  No Known Allergies  REVIEW OF SYSTEMS:   Review of Systems  Constitutional: Negative for fever and weight loss.  HENT: Negative for congestion, nosebleeds and tinnitus.   Eyes: Negative for blurred vision, double vision and redness.  Respiratory: Positive for cough, shortness of breath and wheezing. Negative for hemoptysis.   Cardiovascular: Negative for chest pain, orthopnea, leg swelling and PND.  Gastrointestinal: Negative for abdominal pain, diarrhea, melena, nausea and vomiting.  Genitourinary: Negative for dysuria, hematuria and urgency.  Musculoskeletal: Negative for falls and joint pain.  Neurological: Positive for weakness (Difficult Ambulating). Negative for dizziness, tingling, sensory change, focal weakness, seizures and headaches.  Endo/Heme/Allergies: Negative for polydipsia. Does not bruise/bleed easily.  Psychiatric/Behavioral: Negative for depression and memory loss. The patient is not nervous/anxious.     MEDICATIONS AT HOME:   Prior to Admission medications   Medication Sig Start Date End Date Taking? Authorizing Provider  amLODipine (NORVASC) 10 MG tablet Take 1 tablet (10 mg total) by mouth daily. 04/17/16   Jimmy FootmanHernandez-Gonzalez, Andrea, MD  aspirin 81 MG EC tablet Take 1 tablet (81 mg total) by mouth daily. 04/17/16   Jimmy FootmanHernandez-Gonzalez, Andrea, MD  atorvastatin (LIPITOR) 20 MG tablet Take 1 tablet (20 mg total) by mouth daily at 6 PM. 04/17/16   Jimmy FootmanHernandez-Gonzalez, Andrea, MD  busPIRone (BUSPAR) 10 MG tablet Take by mouth. 05/29/13   [provider]  butalbital-acetaminophen-caffeine Marikay Alar(FIORICET, ESGIC) [id number] MG tablet Take 1-2 tablets by mouth every 6 (six) hours as needed for headache.  04/21/17 04/21/18  Jene EveryKinner, Robert, MD  chlorthalidone (HYGROTON) 25 MG tablet Take 1 tablet (25 mg total) by mouth daily. 04/17/16   Jimmy FootmanHernandez-Gonzalez, Andrea, MD  cloNIDine (CATAPRES) 0.3 MG tablet Take by mouth.    [provider]  clopidogrel (PLAVIX) 75 MG tablet Take 1 tablet (75 mg total) by mouth daily. Patient not taking: Reported on 03/31/2017 04/17/16   Jimmy FootmanHernandez-Gonzalez, Andrea, MD  cyclobenzaprine (FLEXERIL) 10 MG tablet Take 1 tablet (10 mg total) by mouth 3 (three) times daily as needed. 09/13/17   Joni ReiningSmith, Ronald K, PA-C  Diclofenac Sodium 3 % GEL Place onto the skin. 05/06/16   [provider]  doxepin (SINEQUAN) 10 MG capsule Take 10 mg by mouth daily.    [provider]  FLUARIX QUADRIVALENT 0.5 ML injection inject 0.5 milliliter intramuscularly 10/30/16   [provider]  ibuprofen (ADVIL,MOTRIN) 600 MG tablet Take 1 tablet (600 mg total) by mouth every 8 (eight) hours as needed. 09/13/17   Joni ReiningSmith, Ronald K, PA-C  insulin aspart (NOVOLOG) 100 UNIT/ML injection Inject 0-9 Units into the skin 3 (three) times daily with meals.  11/15/16   Katha Hamming, MD  insulin aspart (NOVOLOG) 100 UNIT/ML injection Inject 0-5 Units into the skin at bedtime. 11/15/16   Katha Hamming, MD  Insulin Glargine (LANTUS SOLOSTAR) 100 UNIT/ML Solostar Pen Inject into the skin.    [provider]  insulin regular (NOVOLIN R,HUMULIN R) 100 units/mL injection Inject into the skin.    [provider]  lisinopril (PRINIVIL,ZESTRIL) 40 MG tablet Take by mouth.    [provider]  metFORMIN (GLUCOPHAGE) 500 MG tablet Take one tablet by mouth twice a day 05/29/13   [provider]  metoprolol tartrate (LOPRESSOR) 25 MG tablet Take 1 tablet (25 mg total) by mouth 2 (two) times daily. 11/15/16   Katha Hamming, MD  naproxen (NAPROSYN) 500 MG tablet  10/30/16   [provider]  nitroGLYCERIN (NITROSTAT) 0.4 MG SL tablet Place 1  tablet (0.4 mg total) under the tongue every 5 (five) minutes as needed for chest pain. 04/17/16   Jimmy Footman, MD  oxyCODONE-acetaminophen (PERCOCET/ROXICET) 5-325 MG tablet take 1 tablet by mouth every 6 hours if needed for severe pain 09/24/16   [provider]  pantoprazole (PROTONIX) 40 MG tablet Take 1 tablet (40 mg total) by mouth 2 (two) times daily before a meal. Patient not taking: Reported on 03/31/2017 11/15/16   Katha Hamming, MD  sertraline (ZOLOFT) 50 MG tablet Take by mouth. 04/17/16   [provider]  thiamine 100 MG tablet Take 1 tablet (100 mg total) by mouth daily. 11/15/16   Katha Hamming, MD  traMADol (ULTRAM) 50 MG tablet Take 1 tablet (50 mg total) by mouth every 12 (twelve) hours as needed. 09/13/17   Joni Reining, PA-C  traZODone (DESYREL) 150 MG tablet Take 1 tablet (150 mg total) by mouth at bedtime. 04/17/16   Jimmy Footman, MD      VITAL SIGNS:  Blood pressure (!) 160/99, pulse 86, temperature 98.2 F (36.8 C), temperature source Oral, resp. rate (!) 26, height 5\' 11"  (1.803 m), weight 104.3 kg, SpO2 93 %.  PHYSICAL EXAMINATION:  Physical Exam  GENERAL:  57 y.o.-year-old patient lying in the bed in moderate Resp. distress EYES: Pupils equal, round, reactive to light and accommodation. No scleral icterus. Extraocular muscles intact.  HEENT: Head atraumatic, normocephalic. Oropharynx and nasopharynx clear. No oropharyngeal erythema, moist oral mucosa  NECK:  Supple, no jugular venous distention. No thyroid enlargement, no tenderness.  LUNGS: Good air entry bilaterally, positive use of accessory muscles, diffuse wheezing and rhonchi bilaterally. CARDIOVASCULAR: S1, S2 RRR, Tachycardic. No murmurs, rubs, gallops, clicks.  ABDOMEN: Soft, nontender, nondistended. Bowel sounds present. No organomegaly or mass.  EXTREMITIES: No pedal edema, cyanosis, or clubbing. + 2 pedal & radial pulses b/l.   NEUROLOGIC:  Cranial nerves II through XII are intact. No focal Motor or sensory deficits appreciated b/l. Globally weak.  PSYCHIATRIC: The patient is alert and oriented x 3. SKIN: No obvious rash, lesion, or ulcer.   LABORATORY PANEL:   CBC Recent Labs  Lab 12/24/2017 1038  WBC 10.7*  HGB 15.3  HCT 45.4  PLT 243   ------------------------------------------------------------------------------------------------------------------  Chemistries  Recent Labs  Lab 12/24/2017 1038  NA 140  K 4.0  CL 107  CO2 23  GLUCOSE 200*  BUN 20  CREATININE 1.16  CALCIUM 9.3   ------------------------------------------------------------------------------------------------------------------  Cardiac Enzymes Recent Labs  Lab 12/29/2017 1038  TROPONINI 0.04*   ------------------------------------------------------------------------------------------------------------------  RADIOLOGY:  Ct Head Wo Contrast  Result Date: 12/31/2017 CLINICAL DATA:  Altered level of consciousness,  difficulty with ambulation, history of CVA EXAM: CT HEAD WITHOUT CONTRAST TECHNIQUE: Contiguous axial images were obtained from the base of the skull through the vertex without intravenous contrast. COMPARISON:  11/09/2016 FINDINGS: Brain: No evidence of acute infarction, hemorrhage, hydrocephalus, extra-axial collection or mass lesion/mass effect. Subcortical white matter and periventricular small vessel ischemic changes. Encephalomalacic changes related to old left cerebellar infarct. Vascular: Intracranial atherosclerosis. Skull: Normal. Negative for fracture or focal lesion. Sinuses/Orbits: Mild partial opacification of the bilateral frontal and ethmoid sinuses. Mastoid air cells are clear. Other: None. IMPRESSION: No evidence of acute intracranial abnormality. Old left cerebellar infarct. Small vessel ischemic changes. Electronically Signed   By: Charline Bills M.D.   On: 12/07/2017 13:01   Ct Abdomen Pelvis W Contrast  Result  Date: 12/25/2017 CLINICAL DATA:  Inability to ambulate since this am, hx of cva C/o left flank pain and urinary retention EXAM: CT ABDOMEN AND PELVIS WITH CONTRAST TECHNIQUE: Multidetector CT imaging of the abdomen and pelvis was performed using the standard protocol following bolus administration of intravenous contrast. CONTRAST:  OMNIPAQUE IOHEXOL 300 MG/ML  SOLN COMPARISON:  09/13/2017 and previous FINDINGS: Lower chest: Coronary calcifications. Aortic valve leaflet coarse calcifications. Visualized lung bases clear. No pleural or pericardial effusion. Hepatobiliary: No focal liver abnormality is seen. No gallstones, gallbladder wall thickening, or biliary dilatation. Pancreas: Unremarkable. No pancreatic ductal dilatation or surrounding inflammatory changes. Spleen: Normal in size without focal abnormality. Adrenals/Urinary Tract: No adrenal hemorrhage or renal injury identified. Foley catheter decompresses the urinary bladder. Stomach/Bowel: Stomach and small bowel are nondilated. Normal appendix. Colon is nondilated. A few scattered sigmoid diverticula, without significant adjacent inflammatory/edematous change or abscess. Vascular/Lymphatic: Mild aortoiliac calcified plaque without aneurysm or stenosis. No abdominal or pelvic adenopathy. Reproductive: Prostate is unremarkable. Other: No ascites.  Bilateral pelvic phleboliths.  No free air. Musculoskeletal: Spondylitic changes in the lumbosacral spine. No fracture or worrisome bone lesion. IMPRESSION: 1. No acute findings. 2. Coronary and aortoiliac  atherosclerosis (ICD10-170.0). 3. Sigmoid diverticulosis 4. Lumbosacral spondylitic changes. Electronically Signed   By: Corlis Leak M.D.   On: 12/12/2017 13:16   Dg Chest Port 1 View  Result Date: 12/13/2017 CLINICAL DATA:  Possible aspiration EXAM: PORTABLE CHEST 1 VIEW COMPARISON:  12/30/2017 FINDINGS: Cardiac shadow is enlarged but stable. The lungs are well aerated without focal infiltrate or  sizable effusion. No acute bony abnormality is noted. IMPRESSION: No acute abnormality noted. Electronically Signed   By: Alcide Clever M.D.   On: 12/18/2017 14:28   Dg Chest Portable 1 View  Result Date: 01/01/2018 CLINICAL DATA:  Slurred speech with difficulty ambulating. History of stroke. EXAM: PORTABLE CHEST 1 VIEW COMPARISON:  11/04/2016 and 09/24/2016. FINDINGS: 1134 hour. Two views obtained. The heart size and mediastinal contours are stable. The heart size is at the upper limits of normal. There is aortic and brachiocephalic tortuosity. The lungs are clear. There is no pleural effusion or pneumothorax. No acute osseous findings are evident. Telemetry leads overlie the chest. IMPRESSION: Stable chest without evidence of active cardiopulmonary process. Electronically Signed   By: Carey Bullocks M.D.   On: 12/28/2017 12:08     IMPRESSION AND PLAN:   57 year old male with past medical history of bipolar disorder, previous CVA, diabetes, hypertension, hyperlipidemia, polysubstance abuse, medical noncompliance who presents to the hospital due to generalized weakness, difficulty ambulating and while in the ER noted to be in respiratory distress with aspiration pneumonitis.  1.  Generalized weakness/difficulty ambulating-etiology unclear presently.  Patient apparently has a  previous history of CVA but normally is able to ambulate with the help of walker and could not do that today. - Urinalysis is suggestive of mild UTI.  CT head was negative, will get MRI of the brain to rule out acute CVA. -Treat UTI with IV Unasyn for now, follow clinically.  We will get physical therapy evaluation.  2.  Acute respiratory distress with hypoxia- secondary to suspected aspiration pneumonitis.  Patient attended to eat a sandwich in the ER and aspirated and started having significant wheezing and bronchospasm.   -stat chest x-ray is negative for pneumonia.  We will empirically give the patient IV Unasyn, get speech  evaluation. - Placed the patient on IV steroids, scheduled duo nebs, Pulmicort nebs.  3.  Diabetes type 2 without complication- we will place the patient on sliding scale insulin, follow blood sugars.  4.  Elevated troponin-likely in setting of supply demand ischemia.  Observe on telemetry, cycle patient's cardiac markers.  5.  Essential hypertension- patient's blood pressure is somewhat elevated secondary to her's respiratory distress.  Patient cannot take p.o. presently given his aspiration and awaiting speech evaluation.  We will place the patient on clonidine patch, IV hydralazine as needed.  6.  History of previous CVA-continue aspirin, atorvastatin.  Will await pt's Med. rec to be completed before resuming his other Home Meds.  Pt's daughter has gone home to get his meds as he uses a Energy manager.   All the records are reviewed and case discussed with ED provider. Management plans discussed with the patient, family and they are in agreement.  CODE STATUS: Full code  TOTAL TIME TAKING CARE OF THIS PATIENT: 50 minutes.    Houston Siren M.D on 2018-01-11 at 2:31 PM  Between 7am to 6pm - Pager - [phone]  After 6pm go to [email or web address] - password [email or web address] [facility]  [facility]  Office  [phone]  CC: Primary care physician; Preston Fleeting, MD

## 2017-12-20 NOTE — ED Triage Notes (Signed)
Pt arrived via ems for report of inability to ambulate - pt has hx of stroke with noted slurred speech that is his normal - however he was unable to ambulate with cane or walker this am and that is not normal for him - denies any pain or any other complaints

## 2017-12-20 NOTE — Progress Notes (Signed)
Pt. sx'd for small amt. Of thin blood tinged secretions.Pt. Tolerated it well.

## 2017-12-20 NOTE — ED Notes (Signed)
Patient has grunting respirations, crackles heard in right lund, sats at 94%, resp. 28, pulse 93, 94bpm. Blood pressure not registering.

## 2017-12-20 NOTE — ED Notes (Signed)
Dr. Siadecki aware of troponin of 0.04. 

## 2017-12-20 NOTE — ED Notes (Signed)
Patient's daughter went home to obtain the patient's med list. Patient receives his meds by mail order. Patient's sister is still at bedside.

## 2017-12-20 NOTE — ED Provider Notes (Signed)
[facility] Emergency Department Provider Note ____________________________________________   First MD Initiated Contact with Patient Dec 21, 2017 1046     (approximate)  I have reviewed the triage vital signs and the nursing notes.   HISTORY  Chief Complaint Weakness    HPI [name] Drae Mitzel. is a 57 y.o. male with PMH as noted below including stroke with residual left-sided deficit and dysarthria who presents with generalized weakness, acute onset this morning, associated with inability to walk.  He states that he felt fine yesterday.  He also reports some urinary frequency, as well as some cough and shortness of breath.  He denies headache, vomiting, diarrhea, or fever.   Past Medical History:  Diagnosis Date  . Aortic stenosis, mild February 2015  . Auditory hallucinations   . Bipolar disorder (HCC)   . Coronary artery disease    No reported catheterization. Negative Myoview stress test and 2013.  Marland Kitchen Depression   . Diabetes mellitus   . GERD (gastroesophageal reflux disease)   . History of suicide attempt   . Hx of medication noncompliance   . Hypertension   . Hypertensive hypertrophic cardiomyopathy [facility]) February 2015   Severe concentric LVH on echocardiogram.  . Obesity, Class II, BMI 35-39.9, with comorbidity   . Pericarditis   . Polysubstance abuse (HCC)    cocaine, marijuana  . Stroke [facility]) February 2015   Right MCA aneurysm; head MRI - acute infarct of the right frontal lobe. Smaller acute infarct of the left frontal lobe. Thought to be due to cerebral emboli. Left PICA infarct. Small chronic lacunar infarct right internal capsule. Chronic microvascular ischemic change with white matter change.  . Syncope and collapse     Patient Active Problem List   Diagnosis Date Noted  . GI bleed 11/07/2016  . Accelerated hypertension 06/07/2016  . GERD (gastroesophageal reflux disease) 06/07/2016  . Bipolar affective disorder (HCC) 06/07/2016  .  Chest pain 06/07/2016  . Unspecified Depressive disorder 04/17/2016  . 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 Chronic diastolic CHF (congestive heart failure) (HCC) 10/02/2015  . Tobacco use disorder 11/07/2014  . Stimulant use disorder (HCC) crack cocaine 11/07/2014  . Alcohol use disorder, moderate, dependence (HCC) 11/07/2014  . CAD (coronary artery disease) 11/05/2014  . Cerebral infarction (HCC) 10/19/2014  . LVH (left ventricular hypertrophy) 05/20/2013  . Obesity (BMI 30-39.9) 04/10/2013  . Aortic stenosis, mild 03/07/2013  . DM (diabetes mellitus), type 2, uncontrolled (HCC) 01/05/2013  . Essential hypertension, malignant 01/25/2009    Past Surgical History:  Procedure Laterality Date  . Carotid Dopplers:  03/23/2013   Interval thickening of bilateral common carotid no significant plaque formation. Tortuosity but no significant stenosis. Patent bilateral vertebral  arteries  . NM MYOVIEW LTD  July 2013   No ischemia or infarct. There is inferior wall defec suggestive of diaphragmatic attenuation. Mild LV dilation at to be due to hypertensive heart disease. EF 55%. Poor exercise tolerance.  Marland Kitchen PERICARDIAL FLUID DRAINAGE    . TRANSTHORACIC ECHOCARDIOGRAM  03/23/2013   EF 5560%. Elevated left atrial pressure. Severe concentric LVH. Mild aortic stenosis. -- Appearance of hypertensive hypertrophic heart disease.  No clear cardioembolic source    Prior to Admission medications   Medication Sig Start Date End Date Taking? Authorizing Provider  amLODipine (NORVASC) 10 MG tablet Take 1 tablet (10 mg total) by mouth daily. 04/17/16   Jimmy Footman, MD  aspirin 81 MG EC tablet Take 1 tablet (81 mg total) by mouth  daily. 04/17/16   Jimmy Footman, MD  atorvastatin (LIPITOR) 20 MG tablet Take 1 tablet (20 mg total) by mouth daily at 6 PM. 04/17/16   Jimmy Footman, MD  busPIRone (BUSPAR) 10 MG tablet Take by mouth. 05/29/13    [provider]  butalbital-acetaminophen-caffeine Marikay Alar, ESGIC) 16-109-60 MG tablet Take 1-2 tablets by mouth every 6 (six) hours as needed for headache. 04/21/17 04/21/18  Jene Every, MD  chlorthalidone (HYGROTON) 25 MG tablet Take 1 tablet (25 mg total) by mouth daily. 04/17/16   Jimmy Footman, MD  cloNIDine (CATAPRES) 0.3 MG tablet Take by mouth.    [provider]  clopidogrel (PLAVIX) 75 MG tablet Take 1 tablet (75 mg total) by mouth daily. Patient not taking: Reported on 03/31/2017 04/17/16   Jimmy Footman, MD  cyclobenzaprine (FLEXERIL) 10 MG tablet Take 1 tablet (10 mg total) by mouth 3 (three) times daily as needed. 09/13/17   Joni Reining, PA-C  Diclofenac Sodium 3 % GEL Place onto the skin. 05/06/16   [provider]  doxepin (SINEQUAN) 10 MG capsule Take 10 mg by mouth daily.    [provider]  FLUARIX QUADRIVALENT 0.5 ML injection inject 0.5 milliliter intramuscularly 10/30/16   [provider]  ibuprofen (ADVIL,MOTRIN) 600 MG tablet Take 1 tablet (600 mg total) by mouth every 8 (eight) hours as needed. 09/13/17   Joni Reining, PA-C  insulin aspart (NOVOLOG) 100 UNIT/ML injection Inject 0-9 Units into the skin 3 (three) times daily with meals. 11/15/16   Katha Hamming, MD  insulin aspart (NOVOLOG) 100 UNIT/ML injection Inject 0-5 Units into the skin at bedtime. 11/15/16   Katha Hamming, MD  Insulin Glargine (LANTUS SOLOSTAR) 100 UNIT/ML Solostar Pen Inject into the skin.    [provider]  insulin regular (NOVOLIN R,HUMULIN R) 100 units/mL injection Inject into the skin.    [provider]  lisinopril (PRINIVIL,ZESTRIL) 40 MG tablet Take by mouth.    [provider]  metFORMIN (GLUCOPHAGE) 500 MG tablet Take one tablet by mouth twice a day 05/29/13   [provider]  metoprolol tartrate (LOPRESSOR) 25 MG tablet Take 1 tablet (25 mg total) by mouth 2 (two)  times daily. 11/15/16   Katha Hamming, MD  naproxen (NAPROSYN) 500 MG tablet  10/30/16   [provider]  nitroGLYCERIN (NITROSTAT) 0.4 MG SL tablet Place 1 tablet (0.4 mg total) under the tongue every 5 (five) minutes as needed for chest pain. 04/17/16   Jimmy Footman, MD  oxyCODONE-acetaminophen (PERCOCET/ROXICET) 5-325 MG tablet take 1 tablet by mouth every 6 hours if needed for severe pain 09/24/16   [provider]  pantoprazole (PROTONIX) 40 MG tablet Take 1 tablet (40 mg total) by mouth 2 (two) times daily before a meal. Patient not taking: Reported on 03/31/2017 11/15/16   Katha Hamming, MD  sertraline (ZOLOFT) 50 MG tablet Take by mouth. 04/17/16   [provider]  thiamine 100 MG tablet Take 1 tablet (100 mg total) by mouth daily. 11/15/16   Katha Hamming, MD  traMADol (ULTRAM) 50 MG tablet Take 1 tablet (50 mg total) by mouth every 12 (twelve) hours as needed. 09/13/17   Joni Reining, PA-C  traZODone (DESYREL) 150 MG tablet Take 1 tablet (150 mg total) by mouth at bedtime. 04/17/16   Jimmy Footman, MD    Allergies Patient has no known allergies.  Family History  Problem Relation Age of Onset  . Stroke Mother   . Diabetes type II Mother   .  Hypertension Mother   . Hypertension Father   . Arthritis Father   . Arthritis Sister   . Diabetes type II Sister   . Hypertension Sister     Social History Social History   Tobacco Use  . Smoking status: Current Every Day Smoker    Packs/day: 0.50    Years: 40.00    Pack years: 20.00    Types: Cigars, Cigarettes  . Smokeless tobacco: Never Used  Substance Use Topics  . Alcohol use: No    Alcohol/week: 43.0 standard drinks    Types: 3 Glasses of wine, 40 Cans of beer per week    Comment: occasionally; reports 1 pint of vodka per month at paydaysocially  . Drug use: Yes    Frequency: 1.0 times per week    Types: Marijuana, Cocaine    Comment: past use;  denies current; UDS positive for cocaine    Review of Systems  Constitutional: No fever. Eyes: No redness. ENT: No neck pain. Cardiovascular: Denies chest pain. Respiratory: Positive for shortness of breath. Gastrointestinal: No vomiting.  Genitourinary: Positive for dysuria.  Musculoskeletal: Negative for back pain. Skin: Negative for rash. Neurological: Negative for headache.   ____________________________________________   PHYSICAL EXAM:  VITAL SIGNS: ED Triage Vitals  Enc Vitals Group     BP 08-Jul-2017 1036 (!) 212/128     Pulse Rate 08-Jul-2017 1036 76     Resp 08-Jul-2017 1036 18     Temp 08-Jul-2017 1036 98.2 F (36.8 C)     Temp Source 08-Jul-2017 1036 Oral     SpO2 08-Jul-2017 1036 98 %     Weight 08-Jul-2017 1037 230 lb (104.3 kg)     Height 08-Jul-2017 1037 5\' 11"  (1.803 m)     Head Circumference --      Peak Flow --      Pain Score 08-Jul-2017 1037 0     Pain Loc --      Pain Edu? --      Excl. in GC? --     Constitutional: Alert and oriented.  Relatively comfortable appearing and in no acute distress. Eyes: Conjunctivae are normal.  No scleral icterus. Head: Atraumatic. Nose: No congestion/rhinnorhea. Mouth/Throat: Mucous membranes are slightly dry.   Neck: Normal range of motion.  Cardiovascular: Normal rate, regular rhythm. Grossly normal heart sounds.  Good peripheral circulation. Respiratory: Normal respiratory effort.  No retractions. Lungs CTAB. Gastrointestinal: Soft and nontender. No distention.  Genitourinary: No flank tenderness. Musculoskeletal: No lower extremity edema.  Extremities warm and well perfused.  Neurologic: Dysarthric speech, baseline for patient.  Chronic left-sided weakness, but patient moving all extremities without difficulty. Skin:  Skin is warm and dry. No rash noted. Psychiatric: Mood and affect are normal. Speech and behavior are normal.  ____________________________________________   LABS (all labs ordered are listed, but only abnormal  results are displayed)  Labs Reviewed  BASIC METABOLIC PANEL - Abnormal; Notable for the following components:      Result Value   Glucose, Bld 200 (*)    All other components within normal limits  CBC - Abnormal; Notable for the following components:   WBC 10.7 (*)    All other components within normal limits  URINALYSIS, COMPLETE (UACMP) WITH MICROSCOPIC - Abnormal; Notable for the following components:   Color, Urine YELLOW (*)    APPearance HAZY (*)    Glucose, UA >=500 (*)    Protein, ur 30 (*)    Bacteria, UA RARE (*)    All other components  within normal limits  TROPONIN I - Abnormal; Notable for the following components:   Troponin I 0.04 (*)    All other components within normal limits  INFLUENZA PANEL BY PCR (TYPE A & B)   ____________________________________________  EKG  ED ECG REPORT I, Dionne Bucy, the attending physician, personally viewed and interpreted this ECG.  Date: 12-23-2017 EKG Time: 1035 Rate: 74 Rhythm: normal sinus rhythm QRS Axis: normal Intervals: normal ST/T Wave abnormalities: T wave inversions laterally Narrative Interpretation: no evidence of acute ischemia; no significant change when compared to EKG of 11/07/2016  ____________________________________________  RADIOLOGY  CXR: No focal infiltrate CT abd: Diverticulosis with no diverticulitis.  No other acute abnormalities. CT head: No ICH or acute stroke ____________________________________________   PROCEDURES  Procedure(s) performed: No  Procedures  Critical Care performed: Yes  CRITICAL CARE Performed by: Dionne Bucy   Total critical care time: 30 minutes  Critical care time was exclusive of separately billable procedures and treating other patients.  Critical care was necessary to treat or prevent imminent or life-threatening deterioration.  Critical care was time spent personally by me on the following activities: development of treatment plan with  patient and/or surrogate as well as nursing, discussions with consultants, evaluation of patient's response to treatment, examination of patient, obtaining history from patient or surrogate, ordering and performing treatments and interventions, ordering and review of laboratory studies, ordering and review of radiographic studies, pulse oximetry and re-evaluation of patient's condition. ____________________________________________   INITIAL IMPRESSION / ASSESSMENT AND PLAN / ED COURSE  Pertinent labs & imaging results that were available during my care of the patient were reviewed by me and considered in my medical decision making (see chart for details).  This 57 year old male with PMH as noted above presents with generalized weakness, acute onset this morning, associate with some urinary symptoms as well as cough and shortness of breath.  On exam, the patient is relatively comfortable appearing.  His initial blood pressure was significantly elevated although this may have been due to positioning because a repeat without intervention was much lower.  His other vital signs are normal.  He does have some residual left-sided weakness and dysarthria, but he is moving all of his extremities without difficulty and there are no acute focal neurologic findings.  The remainder of the exam is as described above.  My primary differential for this patient is infection, especially UTI, versus dehydration, electrolyte abnormality or other metabolic cause.  His weakness appears to be global and there is no evidence for acute stroke or other focal neurologic etiology of his difficulty ambulating.  We will obtain labs, chest x-ray, and reassess.  ----------------------------------------- 12:16 PM on 12/23/2017 -----------------------------------------  Initial work-up reveals UA consistent with UTI and otherwise reassuring labs.  The patient became somewhat agitated and was climbing out of the bed, pulling  himself off the monitor.  He started to cough up sputum and appeared quite restless, changing position frequently.  He was placed back on the monitor and his vital signs including O2 saturation remained stable.   He stated that he had just started having severe left lower quadrant abdominal pain which was causing him to need to move around.  On exam he was tender in the left lower quadrant.    I decided to add on a CT abdomen to evaluate the cause of this pain, as well as a CT of the head as the patient appeared slightly less alert than previously.  There is no evidence of respiratory  distress; the patient's coughing and spitting up appear to be due to his chronic neurologic deficits and dysarthria and his lungs remain clear.  We will continue to monitor him closely.  ----------------------------------------- 1:40 PM on 12/19/2017 -----------------------------------------  The imaging is negative.  Since the patient is weak and unable to ambulate I will admit him for treatment of the UTI, as well as stroke work-up.  He attempted to eat and started coughing and vomiting and I am concerned that in addition to the UTI he could have had an acute stroke during the night, though his symptoms might be masked by his underlying deficits.  The patient agrees with the plan.  I signed the patient out to the hospitalist Dr. Cherlynn Kaiser.  ____________________________________________   FINAL CLINICAL IMPRESSION(S) / ED DIAGNOSES  Final diagnoses:  Urinary tract infection without hematuria, site unspecified  Generalized weakness      NEW MEDICATIONS STARTED DURING THIS VISIT:  New Prescriptions   No medications on file     Note:  This document was prepared using Dragon voice recognition software and may include unintentional dictation errors.   Dionne Bucy, MD 12/05/2017 1341

## 2017-12-20 NOTE — Progress Notes (Signed)
Pharmacy Antibiotic Note  Clayten Michaell Cowingride Jr. is a 57 y.o. male admitted on 12/30/2017 with pneumonia.  Pharmacy has been consulted for ampicillin-sulbactam dosing.  Patient's initial urinalysis was suspected to be positive for UTI and he had significant urinary retention and had a Foley catheter placed with 1200 cc of urine drained.  Given his multiple issues hospitalist services were contacted for admission.Concern for aspiration pneumonitis. Patient tried to eat a sandwich and aspirated.   Plan: Will start amp/sulb 3 g q6H per patient's CrCl.   Height: 5\' 11"  (180.3 cm) Weight: 230 lb (104.3 kg) IBW/kg (Calculated) : 75.3  Temp (24hrs), Avg:98.2 F (36.8 C), Min:98.2 F (36.8 C), Max:98.2 F (36.8 C)  Recent Labs  Lab 12/18/2017 1038  WBC 10.7*  CREATININE 1.16    Estimated Creatinine Clearance: 86.4 mL/min (by C-G formula based on SCr of 1.16 mg/dL).    No Known Allergies  Antimicrobials this admission: 11/16 ceftriaxone >> 11/16 x 1 11/16 ampicillin/sulbactam >>   Dose adjustments this admission:   Microbiology results: None.   Thank you for allowing pharmacy to be a part of this patient's care.  Ronnald RampKishan S Limuel Nieblas, PharmD Clinical Pharmacist  12/21/2017 2:49 PM

## 2017-12-20 NOTE — ED Notes (Signed)
Patient continues to use yankauer independently. Patient was repositioned again in an upright position.

## 2017-12-20 NOTE — ED Notes (Addendum)
Patient was given ED sandwich tray per Dr. Marisa SeverinSiadecki. Patient began choking, vomited food per mouth and nose. Dr. Marisa SeverinSiadecki aware. Patient was given Yankauer that he used previously to help with the secretions. Patient is tachypyneic, respirations at 34, sats at 94%. Patient able to nod to questions. Patient denies being on a dysphagia diet at home.

## 2017-12-20 NOTE — ED Notes (Signed)
Patient still has grunting respirations, but appears more calm, tolerating duoneb well. RR23, 94% on 2LO2 and nebulizer.

## 2017-12-20 NOTE — ED Notes (Signed)
Patient was found twice sitting on the end of the stretcher. Patient was holding his penis in a urinal fairly consistently since this writer signed on with little output, though linens did have to changed. Patient declined assistance. Patient continuously clears throat and spits up phelgm. Dr. Marisa SeverinSiadecki at bedside. Patient c/o left flank pain that was a sudden onset. Patient was assisted back to a lying position with head elevated and stretcher in Trendelenburg position and sitter at bedside. Patient has urine continuously dripping out of penis. Foley catheter was ordered and inserted.

## 2017-12-21 ENCOUNTER — Inpatient Hospital Stay: Payer: Medicaid Other

## 2017-12-21 ENCOUNTER — Inpatient Hospital Stay (HOSPITAL_COMMUNITY)
Admit: 2017-12-21 | Discharge: 2017-12-21 | Disposition: A | Discharge status: Home / Self Care | Payer: Medicaid Other | Attending: Internal Medicine | Admitting: Internal Medicine

## 2017-12-21 DIAGNOSIS — G463 Brain stem stroke syndrome: Secondary | ICD-10-CM

## 2017-12-21 DIAGNOSIS — R262 Difficulty in walking, not elsewhere classified: Secondary | ICD-10-CM

## 2017-12-21 DIAGNOSIS — I351 Nonrheumatic aortic (valve) insufficiency: Secondary | ICD-10-CM

## 2017-12-21 LAB — CBC
HEMATOCRIT: 42.3 % (ref 39.0–52.0)
Hemoglobin: 14.6 g/dL (ref 13.0–17.0)
MCH: 30 pg (ref 26.0–34.0)
MCHC: 34.5 g/dL (ref 30.0–36.0)
MCV: 87 fL (ref 80.0–100.0)
NRBC: 0 % (ref 0.0–0.2)
Platelets: 243 10*3/uL (ref 150–400)
RBC: 4.86 MIL/uL (ref 4.22–5.81)
RDW: 13.2 % (ref 11.5–15.5)
WBC: 17.6 10*3/uL — AB (ref 4.0–10.5)

## 2017-12-21 LAB — BASIC METABOLIC PANEL
Anion gap: 10 (ref 5–15)
BUN: 25 mg/dL — ABNORMAL HIGH (ref 6–20)
CHLORIDE: 110 mmol/L (ref 98–111)
CO2: 23 mmol/L (ref 22–32)
Calcium: 9.2 mg/dL (ref 8.9–10.3)
Creatinine, Ser: 1.35 mg/dL — ABNORMAL HIGH (ref 0.61–1.24)
GFR, EST NON AFRICAN AMERICAN: 57 mL/min — AB (ref >60)
Glucose, Bld: 225 mg/dL — ABNORMAL HIGH (ref 70–99)
POTASSIUM: 3.8 mmol/L (ref 3.5–5.1)
SODIUM: 143 mmol/L (ref 135–145)

## 2017-12-21 LAB — LIPID PANEL
Cholesterol: 104 mg/dL (ref 0–200)
HDL: 38 mg/dL — ABNORMAL LOW (ref >40)
LDL Cholesterol: 57 mg/dL (ref 0–99)
Total CHOL/HDL Ratio: 2.7 RATIO
Triglycerides: 47 mg/dL (ref <150)
VLDL: 9 mg/dL (ref 0–40)

## 2017-12-21 LAB — ECHOCARDIOGRAM COMPLETE
Height: 71 in
Weight: 3892.8 oz

## 2017-12-21 LAB — GLUCOSE, CAPILLARY
GLUCOSE-CAPILLARY: 150 mg/dL — AB (ref 70–99)
Glucose-Capillary: 158 mg/dL — ABNORMAL HIGH (ref 70–99)
Glucose-Capillary: 186 mg/dL — ABNORMAL HIGH (ref 70–99)
Glucose-Capillary: 182 mg/dL — ABNORMAL HIGH (ref 70–99)
Glucose-Capillary: 164 mg/dL — ABNORMAL HIGH (ref 70–99)

## 2017-12-21 MED ORDER — HALOPERIDOL LACTATE 5 MG/ML IJ SOLN
1.0000 mg | Freq: Four times a day (QID) | INTRAMUSCULAR | Status: DC | PRN
  Administered 2017-12-21 – 2017-12-24 (×7): 2 mg via INTRAVENOUS
  Administered 2017-12-26: 13:00:00 1 mg via INTRAVENOUS
  Filled 2017-12-21 (×9): qty 1

## 2017-12-21 MED ORDER — SODIUM CHLORIDE 0.9 % IV SOLN
12.5000 mg | Freq: Once | INTRAVENOUS | Status: AC
  Administered 2017-12-21: 08:00:00 12.5 mg via INTRAVENOUS
  Filled 2017-12-21: qty 0.5

## 2017-12-21 MED ORDER — LORAZEPAM 2 MG/ML IJ SOLN
0.5000 mg | Freq: Once | INTRAMUSCULAR | Status: AC
  Administered 2017-12-22: 0.5 mg via INTRAVENOUS
  Filled 2017-12-21: qty 1

## 2017-12-21 MED ORDER — STROKE: EARLY STAGES OF RECOVERY BOOK
Freq: Once | Status: AC
  Administered 2017-12-21: 03:00:00

## 2017-12-21 MED ORDER — MORPHINE SULFATE (PF) 2 MG/ML IV SOLN
2.0000 mg | INTRAVENOUS | Status: DC | PRN
  Administered 2017-12-23 – 2017-12-27 (×8): 2 mg via INTRAVENOUS
  Filled 2017-12-21 (×8): qty 1

## 2017-12-21 MED ORDER — IPRATROPIUM-ALBUTEROL 0.5-2.5 (3) MG/3ML IN SOLN
3.0000 mL | Freq: Three times a day (TID) | RESPIRATORY_TRACT | Status: DC
  Administered 2017-12-21 – 2017-12-22 (×2): 3 mL via RESPIRATORY_TRACT
  Filled 2017-12-21 (×3): qty 3

## 2017-12-21 NOTE — Progress Notes (Signed)
Physical Therapy Evaluation Patient Details Name: [name] LimLinnies Borak Jr. MRN: [id number] DOB: 19-Aug-1960 Today's Date: 12/21/2017   History of Present Illness  [name] [name]  is a 57 y.o. male with a known history of previous CVA, aortic stenosis, bipolar disorder, diabetes, GERD, medical noncompliance, polysubstance abuse who presents to the hospital from home due to weakness, difficulty ambulating.  Patient presently is a poor historian given his respiratory distress as he has aspirated while here in the emergency room.  Most of the history obtained from the family at bedside and from the ER physician.  Patient has a previous history of stroke and usually is able to use a walker and get around but today was having significant weakness and could not ambulate and fell to the ground  Clinical Impression  Patient performs bed mobility independently but is very fast and impulsive. He performs sit to stand with modified independence and RW and patient is impulsive and fast with movements. He feels dizzy and is unable to ambulate today. He will conitnue to benefit from skilled PT to improve mobility and gait.     Follow Up Recommendations SNF    Equipment Recommendations  Rolling walker with 5" wheels    Recommendations for Other Services       Precautions / Restrictions Precautions Precautions: Fall Restrictions Weight Bearing Restrictions: No      Mobility  Bed Mobility Overal bed mobility: Independent             General bed mobility comments: impulsive, fast,usafe  Transfers Overall transfer level: Modified independent Equipment used: Rolling walker (2 wheeled)             General transfer comment: impulsive, fast,uncontroleld  Ambulation/Gait Ambulation/Gait assistance: (unable due to dizziness,unsteadiness)              Stairs            Wheelchair Mobility    Modified Rankin (Stroke Patients Only)       Balance Overall balance assessment: Needs  assistance;History of Falls Sitting-balance support: Bilateral upper extremity supported Sitting balance-Leahy Scale: Fair     Standing balance support: Bilateral upper extremity supported Standing balance-Leahy Scale: Fair Standing balance comment: unsteady,dizzy                             Pertinent Vitals/Pain      Home Living Family/patient expects to be discharged to:: Private residence Living Arrangements: Alone   Type of Home: House Home Access: Stairs to enter Entrance Stairs-Rails: Right Entrance Stairs-Number of Steps: 5 Home Layout: One level Home Equipment: Walker - 2 wheels;Shower seat;Grab bars - tub/shower      Prior Function Level of Independence: Independent with assistive device(s)               Hand Dominance   Dominant Hand: Right    Extremity/Trunk Assessment   Upper Extremity Assessment Upper Extremity Assessment: Overall WFL for tasks assessed    Lower Extremity Assessment Lower Extremity Assessment: Overall WFL for tasks assessed       Communication   Communication: Other (comment)(slurring and difficult to understand)  Cognition Arousal/Alertness: Awake/alert Behavior During Therapy: WFL for tasks assessed/performed;Restless Overall Cognitive Status: No family/caregiver present to determine baseline cognitive functioning  General Comments  Therapeutic activity: Patient performed standing with RW for 3 attempts for 1 min, 2 mins, 30 sec each trial. He continued to feel dizzy during standing. He has fast movements and is difficult to understand.    Exercises     Assessment/Plan    PT Assessment Patient needs continued PT services  PT Problem List Decreased activity tolerance;Decreased balance;Decreased mobility;Decreased knowledge of use of DME       PT Treatment Interventions Gait training;Functional mobility training;Therapeutic activities;Therapeutic  exercise;Balance training    PT Goals (Current goals can be found in the Care Plan section)  Acute Rehab PT Goals Patient Stated Goal: (to go home and walk) PT Goal Formulation: With patient Time For Goal Achievement: 01/04/18 Potential to Achieve Goals: Good    Frequency Min 2X/week   Barriers to discharge        Co-evaluation               AM-PAC PT "6 Clicks" Daily Activity  Outcome Measure Difficulty turning over in bed (including adjusting bedclothes, sheets and blankets)?: A Lot Difficulty moving from lying on back to sitting on the side of the bed? : A Lot Difficulty sitting down on and standing up from a chair with arms (e.g., wheelchair, bedside commode, etc,.)?: A Lot Help needed moving to and from a bed to chair (including a wheelchair)?: A Lot Help needed walking in hospital room?: A Lot Help needed climbing 3-5 steps with a railing? : Total 6 Click Score: 11    End of Session Equipment Utilized During Treatment: Gait belt;Other (comment) Activity Tolerance: (rw) Patient left: in bed;with call bell/phone within reach   PT Visit Diagnosis: Unsteadiness on feet (R26.81);Other abnormalities of gait and mobility (R26.89);Difficulty in walking, not elsewhere classified (R26.2)    Time: 6045-4098 PT Time Calculation (min) (ACUTE ONLY): 30 min   Charges:   PT Evaluation $PT Eval Low Complexity: 1 Low PT Treatments $Therapeutic Activity: 8-22 mins          Ezekiel Ina, PT DPT 12/21/2017, 1:34 PM

## 2017-12-21 NOTE — Progress Notes (Signed)
[employer] [facility] [facility] [place] [facility]   PATIENT NAME: [name] [name]    MR#:  [id number]  DATE OF BIRTH:  1960-02-09  SUBJECTIVE:  CHIEF COMPLAINT:   Chief Complaint  Patient presents with  . Weakness   Continues to have significant dysarthria.  Unable to swallow his own saliva. N.p.o.  Afebrile.  Not on oxygen.  Does not complain of any focal weakness other than chronic mild left weakness  REVIEW OF SYSTEMS:    Review of Systems  Constitutional: Positive for malaise/fatigue. Negative for chills and fever.  HENT: Negative for sore throat.   Eyes: Negative for blurred vision, double vision and pain.  Respiratory: Negative for cough, hemoptysis, shortness of breath and wheezing.   Cardiovascular: Negative for chest pain, palpitations, orthopnea and leg swelling.  Gastrointestinal: Negative for abdominal pain, constipation, diarrhea, heartburn, nausea and vomiting.  Genitourinary: Negative for dysuria and hematuria.  Musculoskeletal: Negative for back pain and joint pain.  Skin: Negative for rash.  Neurological: Positive for speech change and focal weakness. Negative for sensory change and headaches.  Endo/Heme/Allergies: Does not bruise/bleed easily.  Psychiatric/Behavioral: Negative for depression. The patient is not nervous/anxious.     DRUG ALLERGIES:  No Known Allergies  VITALS:  Blood pressure (!) 147/89, pulse 94, temperature 97.9 F (36.6 C), temperature source Oral, resp. rate (!) 22, height 5\' 11"  (1.803 m), weight 110.4 kg, SpO2 90 %.  PHYSICAL EXAMINATION:   Physical Exam  GENERAL:  57 y.o.-year-old patient lying in the bed with slurred speech EYES: Pupils equal, round, reactive to light and accommodation. No scleral icterus. Extraocular muscles intact.  HEENT: Head atraumatic, normocephalic. Oropharynx and nasopharynx clear.  NECK:  Supple, no jugular venous distention. No thyroid enlargement, no tenderness.  LUNGS: Normal breath sounds  bilaterally, no wheezing, rales, rhonchi. No use of accessory muscles of respiration.  CARDIOVASCULAR: S1, S2 normal. No murmurs, rubs, or gallops.  ABDOMEN: Soft, nontender, nondistended. Bowel sounds present. No organomegaly or mass.  EXTREMITIES: No cyanosis, clubbing or edema b/l.    NEUROLOGIC: Cranial nerves II through XII are intact.  Mild decrease in left motor strength.  Right side normal PSYCHIATRIC: The patient is alert and awake SKIN: No obvious rash, lesion, or ulcer.   LABORATORY PANEL:   CBC Recent Labs  Lab 12/21/17 0245  WBC 17.6*  HGB 14.6  HCT 42.3  PLT 243   ------------------------------------------------------------------------------------------------------------------ Chemistries  Recent Labs  Lab 12/21/17 0245  NA 143  K 3.8  CL 110  CO2 23  GLUCOSE 225*  BUN 25*  CREATININE 1.35*  CALCIUM 9.2   ------------------------------------------------------------------------------------------------------------------  Cardiac Enzymes Recent Labs  Lab 12/28/2017 2214  TROPONINI 0.04*   ------------------------------------------------------------------------------------------------------------------  RADIOLOGY:  Ct Head Wo Contrast  Result Date: 12/26/2017 CLINICAL DATA:  Altered level of consciousness, difficulty with ambulation, history of CVA EXAM: CT HEAD WITHOUT CONTRAST TECHNIQUE: Contiguous axial images were obtained from the base of the skull through the vertex without intravenous contrast. COMPARISON:  11/09/2016 FINDINGS: Brain: No evidence of acute infarction, hemorrhage, hydrocephalus, extra-axial collection or mass lesion/mass effect. Subcortical white matter and periventricular small vessel ischemic changes. Encephalomalacic changes related to old left cerebellar infarct. Vascular: Intracranial atherosclerosis. Skull: Normal. Negative for fracture or focal lesion. Sinuses/Orbits: Mild partial opacification of the bilateral frontal and ethmoid  sinuses. Mastoid air cells are clear. Other: None. IMPRESSION: No evidence of acute intracranial abnormality. Old left cerebellar infarct. Small vessel ischemic changes. Electronically Signed   By: Roselie Awkward.D.  On: 12/21/2017 13:01   Mr Brain Wo Contrast  Result Date: 12/14/2017 CLINICAL DATA:  57 year old male with previous stroke and residual left side deficit. Weakness and inability to walk onset this morning. EXAM: MRI HEAD WITHOUT CONTRAST TECHNIQUE: Multiplanar, multiecho pulse sequences of the brain and surrounding structures were obtained without intravenous contrast. COMPARISON:  Head CT earlier today.  Brain MRI 11/07/2016. FINDINGS: Brain: Large chronic left PICA territory infarct. There is left lateral medullary restricted diffusion today in an area of about 9 millimeters (series 5, image 11 and series 7, image 19) with only faint FLAIR hyperintensity. No associated hemorrhage or mass effect. No other restricted diffusion identified. Patchy encephalomalacia also in the right frontal operculum with chronic mild ex vacuo enlargement of the right lateral ventricle. Chronic patchy and confluent bilateral superimposed cerebral white matter T2 and FLAIR hyperintensity. Chronic microhemorrhage in the right occipital pole with mild cortical encephalomalacia at that site. No midline shift, mass effect, evidence of mass lesion, ventriculomegaly, extra-axial collection or acute intracranial hemorrhage. Cervicomedullary junction and pituitary are within normal limits. Vascular: Major intracranial vascular flow voids are stable since 2018. Generalized intracranial artery tortuosity. Skull and upper cervical spine: Negative visible cervical spine. Visualized bone marrow signal is within normal limits. Sinuses/Orbits: Left gaze deviation. Mild to moderate paranasal sinus mucosal thickening. Other: Mastoid air cells are clear. Scalp and face soft tissues appear negative. IMPRESSION: 1. Acute left  lateral medullary infarct with no associated hemorrhage or mass effect. 2. Underlying advanced chronic left PICA territory ischemia. Chronic infarcts also in the right MCA and PCA territory, bilateral cerebral white matter. Electronically Signed   By: Odessa FlemingH  Hall M.D.   On: 12/15/2017 19:29   Ct Abdomen Pelvis W Contrast  Result Date: 12/09/2017 CLINICAL DATA:  Inability to ambulate since this am, hx of cva C/o left flank pain and urinary retention EXAM: CT ABDOMEN AND PELVIS WITH CONTRAST TECHNIQUE: Multidetector CT imaging of the abdomen and pelvis was performed using the standard protocol following bolus administration of intravenous contrast. CONTRAST:  100mL OMNIPAQUE IOHEXOL 300 MG/ML  SOLN COMPARISON:  09/13/2017 and previous FINDINGS: Lower chest: Coronary calcifications. Aortic valve leaflet coarse calcifications. Visualized lung bases clear. No pleural or pericardial effusion. Hepatobiliary: No focal liver abnormality is seen. No gallstones, gallbladder wall thickening, or biliary dilatation. Pancreas: Unremarkable. No pancreatic ductal dilatation or surrounding inflammatory changes. Spleen: Normal in size without focal abnormality. Adrenals/Urinary Tract: No adrenal hemorrhage or renal injury identified. Foley catheter decompresses the urinary bladder. Stomach/Bowel: Stomach and small bowel are nondilated. Normal appendix. Colon is nondilated. A few scattered sigmoid diverticula, without significant adjacent inflammatory/edematous change or abscess. Vascular/Lymphatic: Mild aortoiliac calcified plaque without aneurysm or stenosis. No abdominal or pelvic adenopathy. Reproductive: Prostate is unremarkable. Other: No ascites.  Bilateral pelvic phleboliths.  No free air. Musculoskeletal: Spondylitic changes in the lumbosacral spine. No fracture or worrisome bone lesion. IMPRESSION: 1. No acute findings. 2. Coronary and aortoiliac  atherosclerosis (ICD10-170.0). 3. Sigmoid diverticulosis 4. Lumbosacral  spondylitic changes. Electronically Signed   By: Corlis Leak  Hassell M.D.   On: 12/28/2017 13:16   Dg Chest Port 1 View  Result Date: 12/07/2017 CLINICAL DATA:  Possible aspiration EXAM: PORTABLE CHEST 1 VIEW COMPARISON:  12/22/2017 FINDINGS: Cardiac shadow is enlarged but stable. The lungs are well aerated without focal infiltrate or sizable effusion. No acute bony abnormality is noted. IMPRESSION: No acute abnormality noted. Electronically Signed   By: Alcide CleverMark  Lukens M.D.   On: 12/22/2017 14:28   Dg Chest Portable 1  View  Result Date: 2017-12-31 CLINICAL DATA:  Slurred speech with difficulty ambulating. History of stroke. EXAM: PORTABLE CHEST 1 VIEW COMPARISON:  11/04/2016 and 09/24/2016. FINDINGS: 1134 hour. Two views obtained. The heart size and mediastinal contours are stable. The heart size is at the upper limits of normal. There is aortic and brachiocephalic tortuosity. The lungs are clear. There is no pleural effusion or pneumothorax. No acute osseous findings are evident. Telemetry leads overlie the chest. IMPRESSION: Stable chest without evidence of active cardiopulmonary process. Electronically Signed   By: Carey Bullocks M.D.   On: 2017/12/31 12:08     ASSESSMENT AND PLAN:   58 year old male with past medical history of bipolar disorder, previous CVA, diabetes, hypertension, hyperlipidemia, polysubstance abuse, medical noncompliance who presents to the hospital due to generalized weakness, difficulty ambulating and while in the ER noted to be in respiratory distress with aspiration pneumonitis.  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 Started on aspirin and Plavix per neurology.  Swallow evaluation.  PT and OT consult.  Neurochecks.  Cardiac monitoring.  Echocardiogram shows no thrombus.  MRA of the head pending. No significant change in his motor strength. Likely discharge home with home health  *Acute hypoxic respiratory failure after aspirating in ED.  Resolved.  Today  on room air.  Lungs sound clear.  No shortness of breath.  Afebrile.  He does have elevated WBC.  We will continue IV Unasyn.  *UTI.  On IV antibiotics.  Wait for cultures  *  Diabetes type 2 without complication Sliding scale insulin  *  Essential hypertension IV PRN medications as patient is n.p.o.  *  History of previous CVA-continue aspirin, atorvastatin.  All the records are reviewed and case discussed with Care Management/Social Worker Management plans discussed with the patient, family and they are in agreement.  CODE STATUS: Full code  DVT Prophylaxis: SCDs  TOTAL TIME TAKING CARE OF THIS PATIENT: 35 minutes.   POSSIBLE D/C IN 1-2 DAYS, DEPENDING ON CLINICAL CONDITION.  Molinda Bailiff Debralee Braaksma M.D on 12/21/2017 at 1:50 PM  Between 7am to 6pm - Pager - [phone]  After 6pm go to [email or web address] - password [email or web address] [facility]  [facility]  Office  [phone]  CC: Primary care physician; Preston Fleeting, MD  Note: This dictation was prepared with Dragon dictation along with smaller phrase technology. Any transcriptional errors that result from this process are unintentional.

## 2017-12-21 NOTE — Consult Note (Signed)
STROKE TEAM PROGRESS NOTE   SUBJECTIVE (INTERVAL HISTORY) 57 year old patient presented with ataxia, dysarthria. PMHx CAD, depression, DM, psychiatric hx, HTN, polysubstance abuse, stroke. MRI showed advanced small vessel disease, acute left lateral medullary infarct, advanced chronic left PICA territory ischemia. Chronic infarcts also in the right MCA and PCA territory, bilateral cerebral white matter.   OBJECTIVE Vitals:   18-Jan-2018 2053 12/21/17 0241 12/21/17 0449 12/21/17 0853  BP: (!) 153/82 (!) 172/92 (!) 165/104 (!) 147/89  Pulse: 91 88 87 94  Resp: 20 (!) 24 (!) 22   Temp: 99.7 F (37.6 C) 98 F (36.7 C) 97.9 F (36.6 C)   TempSrc: Oral Oral Oral   SpO2: 93% 99% 96% 90%  Weight:      Height:        CBC:  Recent Labs  Lab 01/18/18 1038 12/21/17 0245  WBC 10.7* 17.6*  HGB 15.3 14.6  HCT 45.4 42.3  MCV 87.8 87.0  PLT 243 243    Basic Metabolic Panel:  Recent Labs  Lab 01-18-18 1038 12/21/17 0245  NA 140 143  K 4.0 3.8  CL 107 110  CO2 23 23  GLUCOSE 200* 225*  BUN 20 25*  CREATININE 1.16 1.35*  CALCIUM 9.3 9.2    Lipid Panel:     Component Value Date/Time   CHOL 104 12/21/2017 0245   CHOL 128 05/13/2013 0405   TRIG 47 12/21/2017 0245   TRIG 175 05/13/2013 0405   HDL 38 (L) 12/21/2017 0245   HDL 19 (L) 05/13/2013 0405   CHOLHDL 2.7 12/21/2017 0245   VLDL 9 12/21/2017 0245   VLDL 35 05/13/2013 0405   LDLCALC 57 12/21/2017 0245   LDLCALC 74 05/13/2013 0405   HgbA1c:  Lab Results  Component Value Date   HGBA1C 5.0 11/09/2016   Urine Drug Screen:     Component Value Date/Time   LABOPIA NONE DETECTED 11/07/2016 1323   LABOPIA NONE DETECTED 01/06/2013 0814   COCAINSCRNUR NONE DETECTED 11/07/2016 1323   LABBENZ NONE DETECTED 11/07/2016 1323   LABBENZ NONE DETECTED 01/06/2013 0814   AMPHETMU NONE DETECTED 11/07/2016 1323   AMPHETMU NONE DETECTED 01/06/2013 0814   THCU NONE DETECTED 11/07/2016 1323   THCU NONE DETECTED 01/06/2013 0814    LABBARB NONE DETECTED 11/07/2016 1323   LABBARB NONE DETECTED 01/06/2013 0814    Alcohol Level     Component Value Date/Time   ETH <10 11/07/2016 1323    IMAGING  Results for orders placed or performed during the hospital encounter of 2018-01-18  MR BRAIN WO CONTRAST   Narrative   CLINICAL DATA:  57 year old male with previous stroke and residual left side deficit. Weakness and inability to walk onset this morning.  EXAM: MRI HEAD WITHOUT CONTRAST  TECHNIQUE: Multiplanar, multiecho pulse sequences of the brain and surrounding structures were obtained without intravenous contrast.  COMPARISON:  Head CT earlier today.  Brain MRI 11/07/2016.  FINDINGS: Brain: Large chronic left PICA territory infarct. There is left lateral medullary restricted diffusion today in an area of about 9 millimeters (series 5, image 11 and series 7, image 19) with only faint FLAIR hyperintensity. No associated hemorrhage or mass effect.  No other restricted diffusion identified. Patchy encephalomalacia also in the right frontal operculum with chronic mild ex vacuo enlargement of the right lateral ventricle. Chronic patchy and confluent bilateral superimposed cerebral white matter T2 and FLAIR hyperintensity. Chronic microhemorrhage in the right occipital pole with mild cortical encephalomalacia at that site.  No midline shift,  mass effect, evidence of mass lesion, ventriculomegaly, extra-axial collection or acute intracranial hemorrhage. Cervicomedullary junction and pituitary are within normal limits.  Vascular: Major intracranial vascular flow voids are stable since 2018. Generalized intracranial artery tortuosity.  Skull and upper cervical spine: Negative visible cervical spine. Visualized bone marrow signal is within normal limits.  Sinuses/Orbits: Left gaze deviation. Mild to moderate paranasal sinus mucosal thickening.  Other: Mastoid air cells are clear. Scalp and face soft  tissues appear negative.  IMPRESSION: 1. Acute left lateral medullary infarct with no associated hemorrhage or mass effect. 2. Underlying advanced chronic left PICA territory ischemia. Chronic infarcts also in the right MCA and PCA territory, bilateral cerebral white matter.   Electronically Signed   By: Odessa Fleming M.D.   On: Dec 28, 2017 19:29   CT Head Wo Contrast   Narrative   CLINICAL DATA:  Altered level of consciousness, difficulty with ambulation, history of CVA  EXAM: CT HEAD WITHOUT CONTRAST  TECHNIQUE: Contiguous axial images were obtained from the base of the skull through the vertex without intravenous contrast.  COMPARISON:  11/09/2016  FINDINGS: Brain: No evidence of acute infarction, hemorrhage, hydrocephalus, extra-axial collection or mass lesion/mass effect.  Subcortical white matter and periventricular small vessel ischemic changes.  Encephalomalacic changes related to old left cerebellar infarct.  Vascular: Intracranial atherosclerosis.  Skull: Normal. Negative for fracture or focal lesion.  Sinuses/Orbits: Mild partial opacification of the bilateral frontal and ethmoid sinuses. Mastoid air cells are clear.  Other: None.  IMPRESSION: No evidence of acute intracranial abnormality.  Old left cerebellar infarct.  Small vessel ischemic changes.   Electronically Signed   By: Charline Bills M.D.   On: 2017/12/28 13:01      Physical exam: Exam: Gen: NAD, severe dysarthria, well nourised, obese                    CV: RRR, no MRG. No Carotid Bruits. No peripheral edema, warm, nontender Eyes: Conjunctivae clear without exudates or hemorrhage  Neuro: Detailed Neurologic Exam  Speech:    Speech is severely dysarthric.  Cognition:    The patient folows commands and nods appropriately to questions. Feel this is severe dysarthria without aphasia.  Cranial Nerves:    Left miosis. Attempted fundoscopy could not visualize due to  non-cooperation. Visual fields are full to threat. Left gaze preference but he can cross the midline with extraocular movements intact. Trigeminal sensation is intact and the muscles of mastication are normal. Left droop.lower facial. The palate elevates in the midline. Hearing intact. Voice is normal. Shoulder shrug is normal. The tongue has normal motion without fasciculations.   Coordination:    Dysmetria left  Gait:    Deferred due to medical consition  Motor Observation:    No asymmetry, no atrophy, and no involuntary movements noted. Tone:    Normal muscle tone.    Posture:    Posture is normal. normal erect    Strength:    Mild left hemiparesis      Sensation: intact to LT, he denies ay sensory changes (nodding to questions)     Reflex Exam:  DTR's:    Deep tendon reflexes in the upper and lower extremities are symmetrical bilaterally.   Toes:    The toes are equiv bilaterally.   Clonus:    Clonus is absent.  ASSESSMENT/PLAN 57 year old patient presented with ataxia, dysarthria. PMHx CAD, depression, DM, psychiatric hx, HTN, polysubstance abuse, stroke. MRI showed advanced small vessel disease, acute left lateral  medullary infarct, advanced chronic left PICA territory ischemia. Chronic infarcts also in the right MCA and PCA territory, bilateral cerebral white matter.  Stroke secondary to small vessel disease source  Resultant  Wallenberg syndrome  CT head no acute findings  MRI head  advanced small vessel disease, acute left lateral medullary infarct, advanced chronic left PICA territory ischemia. Chronic infarcts also in the right MCA and PCA territory, bilateral cerebral white matter.  MRA head and neck pending  2D Echo no thrombus identified  LDL 57  HgbA1c pending  No antiplatelets prior to admission, now on ASA  Patient counseled to be compliant with his antithrombotic medications  Ongoing aggressive stroke risk factor management  Therapy  recommendations:  pending  Disposition:  Pending  Smoking and drug cessation discussed  Hypertension .  Permissive hypertension (OK if < 220/120) but gradually normalize in 5-7 days .  Long-term BP goal normotensive  Hyperlipidemia  LDL 57, goal < 70  Diabetes type II  HgbA1c pending, goal < 7.0  Other Stroke Risk Factors  Polysubstance abuse, noncompliance(not taking his ASA at home per patient)  Obesity, Body mass index is 33.93 kg/m., recommend weight loss, diet and exercise as appropriate   Hx stroke/TIA  Please screen for obstructive sleep apnea  Plan  Pending MRA of the head and neck  DUAP plavix 75mg  and ASA 81mg  for 3 weeks then asa alone  Hospital day # 1  Personally examined patient and images, and have participated in and made any corrections needed to history, physical, neuro exam,assessment and plan as stated above.  I have personally obtained the history, evaluated lab date, reviewed imaging studies and agree with radiology interpretations.    Naomie DeanAntonia Ahern, MD Neurology     To contact Stroke Continuity provider, please refer to [email or web address]. After hours, contact General Neurology

## 2017-12-22 ENCOUNTER — Inpatient Hospital Stay: Payer: Medicaid Other

## 2017-12-22 LAB — CBC WITH DIFFERENTIAL/PLATELET
ABS IMMATURE GRANULOCYTES: 0.14 10*3/uL — AB (ref 0.00–0.07)
Basophils Absolute: 0 10*3/uL (ref 0.0–0.1)
Basophils Relative: 0 %
Eosinophils Absolute: 0 10*3/uL (ref 0.0–0.5)
Eosinophils Relative: 0 %
HCT: 43.3 % (ref 39.0–52.0)
HEMOGLOBIN: 14.3 g/dL (ref 13.0–17.0)
IMMATURE GRANULOCYTES: 1 %
LYMPHS PCT: 8 %
Lymphs Abs: 1.5 10*3/uL (ref 0.7–4.0)
MCH: 29.9 pg (ref 26.0–34.0)
MCHC: 33 g/dL (ref 30.0–36.0)
MCV: 90.6 fL (ref 80.0–100.0)
MONO ABS: 0.9 10*3/uL (ref 0.1–1.0)
MONOS PCT: 5 %
NEUTROS ABS: 15.4 10*3/uL — AB (ref 1.7–7.7)
Neutrophils Relative %: 86 %
Platelets: 243 10*3/uL (ref 150–400)
RBC: 4.78 MIL/uL (ref 4.22–5.81)
RDW: 13.7 % (ref 11.5–15.5)
WBC: 18 10*3/uL — ABNORMAL HIGH (ref 4.0–10.5)
nRBC: 0 % (ref 0.0–0.2)

## 2017-12-22 LAB — BASIC METABOLIC PANEL
Anion gap: 7 (ref 5–15)
BUN: 36 mg/dL — AB (ref 6–20)
CO2: 25 mmol/L (ref 22–32)
CREATININE: 1.1 mg/dL (ref 0.61–1.24)
Calcium: 8.9 mg/dL (ref 8.9–10.3)
Chloride: 115 mmol/L — ABNORMAL HIGH (ref 98–111)
Glucose, Bld: 185 mg/dL — ABNORMAL HIGH (ref 70–99)
POTASSIUM: 3.9 mmol/L (ref 3.5–5.1)
Sodium: 147 mmol/L — ABNORMAL HIGH (ref 135–145)

## 2017-12-22 LAB — GLUCOSE, CAPILLARY
Glucose-Capillary: 188 mg/dL — ABNORMAL HIGH (ref 70–99)
Glucose-Capillary: 192 mg/dL — ABNORMAL HIGH (ref 70–99)
Glucose-Capillary: 125 mg/dL — ABNORMAL HIGH (ref 70–99)

## 2017-12-22 LAB — HEMOGLOBIN A1C
Hgb A1c MFr Bld: 7.1 % — ABNORMAL HIGH (ref 4.8–5.6)
Mean Plasma Glucose: 157 mg/dL

## 2017-12-22 LAB — HIV ANTIBODY (ROUTINE TESTING W REFLEX): HIV Screen 4th Generation wRfx: NONREACTIVE

## 2017-12-22 MED ORDER — JEVITY 1.5 CAL/FIBER PO LIQD: 1000.0000 mL | ORAL | Status: DC

## 2017-12-22 MED ORDER — CLONIDINE HCL 0.2 MG/24HR TD PTWK: 0.2000 mg | MEDICATED_PATCH | TRANSDERMAL | Status: DC

## 2017-12-22 MED ORDER — SODIUM CHLORIDE 0.9 % IV SOLN
25.0000 mg | Freq: Three times a day (TID) | INTRAVENOUS | Status: DC | PRN
  Administered 2017-12-22 – 2017-12-27 (×4): 25 mg via INTRAVENOUS
  Filled 2017-12-22 (×4): qty 1

## 2017-12-22 MED ORDER — TAMSULOSIN HCL 0.4 MG PO CAPS: 0.4000 mg | ORAL_CAPSULE | Freq: Every day | ORAL | Status: DC

## 2017-12-22 MED ORDER — IPRATROPIUM-ALBUTEROL 0.5-2.5 (3) MG/3ML IN SOLN
3.0000 mL | Freq: Two times a day (BID) | RESPIRATORY_TRACT | Status: DC
  Administered 2017-12-22 – 2017-12-26 (×8): 3 mL via RESPIRATORY_TRACT
  Filled 2017-12-22 (×9): qty 3

## 2017-12-22 MED ORDER — GLYCOPYRROLATE 0.2 MG/ML IJ SOLN
0.1000 mg | INTRAMUSCULAR | Status: DC
  Administered 2017-12-23 (×3): 0.1 mg via INTRAVENOUS
  Filled 2017-12-22 (×6): qty 0.5

## 2017-12-22 MED ORDER — FREE WATER: 30.0000 mL | Status: DC

## 2017-12-22 MED ORDER — NITROGLYCERIN 2 % TD OINT
0.5000 [in_us] | TOPICAL_OINTMENT | Freq: Four times a day (QID) | TRANSDERMAL | Status: DC
  Administered 2017-12-22 – 2017-12-23 (×5): 0.5 [in_us] via TOPICAL
  Filled 2017-12-22 (×5): qty 1

## 2017-12-22 MED ORDER — PRO-STAT SUGAR FREE PO LIQD: 30.0000 mL | Freq: Two times a day (BID) | ORAL | Status: DC

## 2017-12-22 MED ORDER — LORAZEPAM 2 MG/ML IJ SOLN
1.0000 mg | Freq: Once | INTRAMUSCULAR | Status: AC
  Administered 2017-12-22: 04:00:00 1 mg via INTRAVENOUS
  Filled 2017-12-22: qty 1

## 2017-12-22 MED ORDER — PREDNISONE 50 MG PO TABS: 50.0000 mg | ORAL_TABLET | Freq: Every day | ORAL | Status: DC

## 2017-12-22 MED ORDER — LABETALOL HCL 5 MG/ML IV SOLN
10.0000 mg | INTRAVENOUS | Status: DC | PRN
  Administered 2017-12-22 – 2017-12-23 (×3): 10 mg via INTRAVENOUS
  Filled 2017-12-22 (×3): qty 4

## 2017-12-22 MED ORDER — HALOPERIDOL LACTATE 5 MG/ML IJ SOLN
2.0000 mg | Freq: Once | INTRAMUSCULAR | Status: AC
  Administered 2017-12-22: 03:00:00 2 mg via INTRAVENOUS

## 2017-12-22 NOTE — Progress Notes (Signed)
Overall pnt was extremely anxious, irritable, and agitated overnight requiring notification to MD on several occasions. Multiple doses of Haldol and ativan given with minimal change. Sitter present at bedside at this time, per order.  Pnt became aggressive on multiple occasions requiring a 2nd nurse to assist pnt back in the bed.   Also notified Dr. Anne HahnWillis and Dr. Sheryle Hailiamond of blood pressures pnt had overnight. SBP 160-200 and DBP 90-115. Hydralazine given PRN x's1 and Nitro paste started which is now scheduled( per Dr. Sheryle Hailiamond). Requested MD evaluate MIVF orders but no change at this time. Thick secretions pnt still is suctioning with his own [place] at bedside. IV antibiotics given overnight. Afebrile. Sitter again sitting at bedside with pnt.

## 2017-12-22 NOTE — Evaluation (Signed)
Occupational Therapy Evaluation Patient Details Name: [name] [name]. MRN: [id number] DOB: [birth year] Today's Date: 12/22/2017    History of Present Illness [name] [name]  is a 57 y.o. male with a known history of previous CVA, aortic stenosis, bipolar disorder, diabetes, GERD, medical noncompliance, polysubstance abuse who presents to the hospital from home due to weakness, difficulty ambulating.  Patient presently is a poor historian given his respiratory distress as he has aspirated while here in the emergency room.  Most of the history obtained from the family at bedside and from the ER physician.  Patient has a previous history of stroke and usually is able to use a walker and get around but today was having significant weakness and could not ambulate and fell to the ground   Clinical Impression   Pt is 57 year old male with a known history of previous CVA, aortic stenosis, bipolar disorder, diabetes, GERD, medical noncompliance, polysubstance abuse who presents to the hospital from home due to weakness, difficulty ambulating. He was living at home alone ambulating with a FWW.  He is NPO due to failing his swallow test and having difficulty managing secretions and using suction. He has a Comptroller in his room due to agitation and trying to climb out of hospital bed and is restless. He has very slurred speech which is difficult to understand. He also has recurrent hiccups and just received Thorazine in IV from NSG.  He tried to hit NSG earlier before meds were given again (several doses needed last night as well).  He is able to keep eyes open briefly during session and needs cues and directions to be repeated.  He appears to have weakness in RUE and hand but difficult to fully assess due to lethargy. He is able to complete grooming skills and follow simple commands to assess AROM in BUEs and hands but having difficulty with attention.  Rec OT for ADL retraining, neuromuscular re-ed, safety skills  training, balance training and patient and family ed and training with increased frequency of OT if level of alertness and attention improves and agitation decreases.  Rec SNF after discharge.    Follow Up Recommendations  SNF    Equipment Recommendations       Recommendations for Other Services       Precautions / Restrictions Precautions Precautions: Fall Precaution Comments: hx of agitation needing Haldol Restrictions Weight Bearing Restrictions: No      Mobility Bed Mobility                  Transfers                      Balance                                           ADL either performed or assessed with clinical judgement   ADL Overall ADL's : Needs assistance/impaired   Eating/Feeding Details (indicate cue type and reason): NPO due to failing bedside swallow assessment and difficulty managing secretions and able to suction self. Grooming: Therapist, nutritional;Set up;Bed level;Oral care Grooming Details (indicate cue type and reason): oral care using lightly moistened swab only due to aspiration risk not able to brush teeth         Upper Body Dressing : Minimal assistance;Set up   Lower Body Dressing: Maximal assistance;Set up  General ADL Comments: limited assessment completed today due to pt sleepy from Haldol for agitation and wanting to try to climb out of bed which is a new behavior since he was living at home alone using a FWW.  He has bad case of hiccups and now on Thorazine as well via IV.  Pt kept his eyes closed most of session even with lights on in room with difficulty keeping eyes open more than a few minutes.      Vision   Additional Comments: difficulty assess due to poor alertness level and received Haldol 2 hours ago for agitation.  Will assess when more alert and able to follow directions longer.     Perception     Praxis      Pertinent Vitals/Pain Pain Assessment: No/denies pain      Hand Dominance Right   Extremity/Trunk Assessment Upper Extremity Assessment Upper Extremity Assessment: RUE deficits/detail RUE Deficits / Details: Decreased AROM and appears weak compared to L with shoulder flexion and grip strength but also difficult to fully assess due to lethargy   Lower Extremity Assessment Lower Extremity Assessment: Defer to PT evaluation       Communication Communication Communication: Other (comment)(Slurred speech and not clear if this if from medications or from new CVA or both)   Cognition Arousal/Alertness: Lethargic;Suspect due to medications Behavior During Therapy: Restless;Flat affect;Impulsive Overall Cognitive Status: No family/caregiver present to determine baseline cognitive functioning                                 General Comments: Has a sitter due to hx of agitation and in need of Haldol and now Thorazine for hiccups.   General Comments       Exercises     Shoulder Instructions      Home Living Family/patient expects to be discharged to:: Private residence Living Arrangements: Alone   Type of Home: House Home Access: Stairs to enter Entergy CorporationEntrance Stairs-Number of Steps: 5 Entrance Stairs-Rails: Right Home Layout: One level     Bathroom Shower/Tub: Chief Strategy OfficerTub/shower unit   Bathroom Toilet: Standard     Home Equipment: Environmental consultantWalker - 2 wheels;Shower seat;Grab bars - tub/shower          Prior Functioning/Environment Level of Independence: Independent with assistive device(s)                 OT Problem List: Decreased activity tolerance;Decreased safety awareness;Decreased cognition;Impaired balance (sitting and/or standing)      OT Treatment/Interventions: Self-care/ADL training;Patient/family education;Neuromuscular education    OT Goals(Current goals can be found in the care plan section) Acute Rehab OT Goals Patient Stated Goal: pt unable to state goals due to lethargy and change in cognition  ADL  Goals Pt Will Perform Upper Body Dressing: with supervision;sitting;with min guard assist Pt Will Perform Lower Body Dressing: with set-up;with min guard assist;sit to/from stand  OT Frequency: Other (comment)(Rec 1x per week until agitation is less and he is able to participate without lethargy and then increase to 3x per week)   Barriers to D/C:    was living at home alone       Co-evaluation              AM-PAC PT "6 Clicks" Daily Activity     Outcome Measure Help from another person eating meals?: Total(NPO) Help from another person taking care of personal grooming?: A Little Help from another person toileting, which includes using  toliet, bedpan, or urinal?: A Lot Help from another person bathing (including washing, rinsing, drying)?: A Lot Help from another person to put on and taking off regular upper body clothing?: A Little Help from another person to put on and taking off regular lower body clothing?: A Lot 6 Click Score: 13   End of Session    Activity Tolerance: Patient limited by lethargy Patient left: in bed;with call bell/phone within reach;with nursing/sitter in room  OT Visit Diagnosis: Unsteadiness on feet (R26.81);Muscle weakness (generalized) (M62.81)                Time: 1610-9604 OT Time Calculation (min): 30 min Charges:  OT General Charges $OT Visit: 1 Visit OT Evaluation $OT Eval Low Complexity: 1 Low OT Treatments $Self Care/Home Management : 8-22 mins  Susanne Borders, OTR/L ascom [phone] 12/22/17, 3:16 PM

## 2017-12-22 NOTE — Evaluation (Addendum)
Clinical/Bedside Swallow Evaluation Patient Details  Name: [name] [name]. MRN: [id number] Date of Birth: 1961/01/01  Today's Date: 12/22/2017 Time: SLP Start Time (ACUTE ONLY): 0850 SLP Stop Time (ACUTE ONLY): 0950 SLP Time Calculation (min) (ACUTE ONLY): 60 min  Past Medical History:  Past Medical History:  Diagnosis Date  . Aortic stenosis, mild February 2015  . Auditory hallucinations   . Bipolar disorder (HCC)   . Coronary artery disease    No reported catheterization. Negative Myoview stress test and 2013.  Marland Kitchen Depression   . Diabetes mellitus   . GERD (gastroesophageal reflux disease)   . History of suicide attempt   . Hx of medication noncompliance   . Hypertension   . Hypertensive hypertrophic cardiomyopathy [facility]) February 2015   Severe concentric LVH on echocardiogram.  . Obesity, Class II, BMI 35-39.9, with comorbidity   . Pericarditis   . Polysubstance abuse (HCC)    cocaine, marijuana  . Stroke [facility]) February 2015   Right MCA aneurysm; head MRI - acute infarct of the right frontal lobe. Smaller acute infarct of the left frontal lobe. Thought to be due to cerebral emboli. Left PICA infarct. Small chronic lacunar infarct right internal capsule. Chronic microvascular ischemic change with white matter change.  . Syncope and collapse    Past Surgical History:  Past Surgical History:  Procedure Laterality Date  . Carotid Dopplers:  03/23/2013   Interval thickening of bilateral common carotid no significant plaque formation. Tortuosity but no significant stenosis. Patent bilateral vertebral  arteries  . NM MYOVIEW LTD  July 2013   No ischemia or infarct. There is inferior wall defec suggestive of diaphragmatic attenuation. Mild LV dilation at to be due to hypertensive heart disease. EF 55%. Poor exercise tolerance.  Marland Kitchen PERICARDIAL FLUID DRAINAGE    . TRANSTHORACIC ECHOCARDIOGRAM  03/23/2013   EF 5560%. Elevated left atrial pressure. Severe concentric LVH. Mild aortic  stenosis. -- Appearance of hypertensive hypertrophic heart disease.  No clear cardioembolic source   HPI:  Pt is a 57 y.o. male with a known history of previous CVA, aortic stenosis, bipolar disorder, diabetes, GERD, medical noncompliance, polysubstance abuse who presents to the hospital from home due to weakness, difficulty ambulating.  Patient presently is a poor historian given his respiratory distress as he has aspirated while here in the emergency room.  Most of the history obtained from the family at bedside and from the ER physician.  Patient has a previous history of stroke and usually is able to use a walker and get around but today was having significant weakness and could not ambulate and fell to the ground.  He was found by his neighbor on the floor and therefore EMS was called and he was brought to the hospital.  Patient is not on a specialized diet since his previous CVA but while in the ER he attempted to eat a sandwich and aspirated and now was having significant respiratory distress.  Patient also had an episode of vomiting which was nonbloody and bilious in nature in the ER earlier.  Patient's initial urinalysis was suspected to be positive for UTI and he had significant urinary retention and had a Foley catheter placed with 1200 cc of urine drained. Pt has had a previous stroke (Large chronic left PICA territory infarct) w/ residual left side deficit; infarcts also in the right MCA and PCA territory, bilateral cerebral chronic w/ white matter change. 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 Pt is currently presenting w/, and having increased  difficulty managing, own secretions. W/ strong cues, pt was able to cough and expectorate moderate phlegm. He presented w/ clear vocal quality during phonations immediately after but soon increased wet respirations were noted again (all PRIOR TO any po's given). He has recurrent Hiccups and receives medication in attempts to control. Noted  agitation per chart notes at previous times. Sitter present in room.   Assessment / Plan / Recommendation Clinical Impression  Pt appeared to present w/ Severe oropharyngeal phase swallowing deficits as noted during po trials of ice chips and thin liquids; OM exam revealed lingual deviation and no Gag reflex. The oropharyngeal swallow appeared uncoordinated w/ suspected delayed pharyngeal swallow intiation. This led to immediate, overt s/s of aspiration w/ trials of both ice chips and small sips of thin liquids. During the few po trials given, noted oral phase deficits including reduced lingual/labial strength and decreased lingual coordination and strength(posterior) for bolus control, increased A-P transfer time, and reduced efficient engagement of the swallow. Suspect delayed pharyngeal swallow initiation and reduced effective, timely airway closure during the pharyngeal phase of swallowing. Multiple swallowing attempts noted w/ each trial to initiate/complete the swallow. With pt's presentation of immediate s/s of aspiration w/ the few po's given, and his difficulty controlling his own native secretions, NO further po trials were given and NO po diet is recommended at this time d/t the high risk for aspiration and Pulmonary decline.  Recommend continue NPO status w/ frequent oral care for hygiene and stimulation of swallowing; alternative means of feeding (NG currently) in order to provide nutrition (pt c/o feeling hungry). Recommend f/u w/ objective swallowing assessment next 1-3 days b/f any further po trials or initiation of oral diet d/t high risk for aspiration. Explained this to pt; NSG and MD updated.  SLP Visit Diagnosis: Dysphagia, oropharyngeal phase (R13.12)    Aspiration Risk  Severe aspiration risk;Risk for inadequate nutrition/hydration    Diet Recommendation  NPO w/ frequent oral care for hygiene and stimulation of swallowing; aspiration precautions  Medication Administration: Via  alternative means    Other  Recommendations Recommended Consults: (Dietician f/u) Oral Care Recommendations: Oral care QID;Staff/trained caregiver to provide oral care(for hygiene and stimulation of swallowing) Other Recommendations: (TBD)   Follow up Recommendations Skilled Nursing facility(TBD)      Frequency and Duration (TBD)  (TBD)       Prognosis Prognosis for Safe Diet Advancement: Guarded      Swallow Study   General Date of Onset: 2017-12-24 HPI: Pt is a 57 y.o. male with a known history of previous CVA, aortic stenosis, bipolar disorder, diabetes, GERD, medical noncompliance, polysubstance abuse who presents to the hospital from home due to weakness, difficulty ambulating.  Patient presently is a poor historian given his respiratory distress as he has aspirated while here in the emergency room.  Most of the history obtained from the family at bedside and from the ER physician.  Patient has a previous history of stroke and usually is able to use a walker and get around but today was having significant weakness and could not ambulate and fell to the ground.  He was found by his neighbor on the floor and therefore EMS was called and he was brought to the hospital.  Patient is not on a specialized diet since his previous CVA but while in the ER he attempted to eat a sandwich and aspirated and now was having significant respiratory distress.  Patient also had an episode of vomiting which was nonbloody and bilious in  nature in the ER earlier.  Patient's initial urinalysis was suspected to be positive for UTI and he had significant urinary retention and had a Foley catheter placed with 1200 cc of urine drained. Pt has had a previous stroke (Large chronic left PICA territory infarct) w/ residual left side deficit; infarcts also in the right MCA and PCA territory, bilateral cerebral chronic w/ white matter change. 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 Pt is currently presenting w/,  and having increased difficulty managing, own secretions. W/ strong cues, pt was able to cough and expectorate moderate phlegm. He presented w/ clear vocal quality during phonations immediately after but soon increased wet respirations were noted again (all PRIOR TO any po's given).  Type of Study: Bedside Swallow Evaluation Previous Swallow Assessment: none reported Diet Prior to this Study: NPO(has been on a regular diet at home) Temperature Spikes Noted: No(wbc 18 - elevated since admission) Respiratory Status: Room air History of Recent Intubation: No Behavior/Cognition: Alert;Cooperative;Pleasant mood;Distractible;Requires cueing(Dysarthria) Oral Cavity Assessment: Excessive secretions(thick) Oral Care Completed by SLP: Recent completion by staff Oral Cavity - Dentition: Missing dentition;Poor condition(missing most) Vision: Functional for self-feeding Self-Feeding Abilities: Able to feed self;Needs assist;Needs set up Patient Positioning: Upright in bed Baseline Vocal Quality: Normal(during phonations) Volitional Cough: Weak;Congested(poor vocal cord contact and coordination for effectiveness) Volitional Swallow: Unable to elicit    Oral/Motor/Sensory Function Overall Oral Motor/Sensory Function: Moderate impairment(much of which may be baseline(?) d/t old, large CVA) Facial ROM: Reduced left;Suspected CN VII (facial) dysfunction Facial Symmetry: Abnormal symmetry left;Suspected CN VII (facial) dysfunction Facial Strength: Reduced left;Suspected CN VII (facial) dysfunction Facial Sensation: Reduced left Lingual ROM: Reduced left(min) Lingual Symmetry: Abnormal symmetry right;Suspected CN XII (hypoglossal) dysfunction(deviated to the R) Lingual Strength: Reduced(overall; posterior strength reduced) Velum: (difficult to assess) Mandible: Within Functional Limits   Ice Chips Ice chips: Impaired Presentation: Spoon(fed; 3 trials) Oral Phase Impairments: Reduced lingual  movement/coordination;Poor awareness of bolus;Impaired mastication;Reduced labial seal Oral Phase Functional Implications: Prolonged oral transit;Oral holding Pharyngeal Phase Impairments: Suspected delayed Swallow;Multiple swallows;Cough - Immediate;Cough - Delayed Other Comments: poor oropharyngeal coordination of the trials   Thin Liquid Thin Liquid: Impaired Presentation: Cup;Self Fed(assisted; 2 trials) Oral Phase Impairments: Reduced labial seal;Reduced lingual movement/coordination;Poor awareness of bolus Oral Phase Functional Implications: Prolonged oral transit(poor coordination of bolus material) Pharyngeal  Phase Impairments: Suspected delayed Swallow;Cough - Immediate(x2/2 trials) Other Comments: significant difficulty calming breathing post 1 suspected aspiration event    Nectar Thick Nectar Thick Liquid: Not tested   Honey Thick Honey Thick Liquid: Not tested   Puree Puree: Not tested   Solid     Solid: Not tested       Jerilynn SomKatherine Watson, MS, CCC-SLP Watson,Katherine 12/22/2017,2:10 PM

## 2017-12-22 NOTE — Progress Notes (Addendum)
[facility] - [place] at [facility]   PATIENT NAME: [name] [name]    MR#:  [id number]  DATE OF BIRTH:  10-26-1960  SUBJECTIVE:  CHIEF COMPLAINT:   Chief Complaint  Patient presents with  . Weakness   - admitted with stroke, confused and agitated - NPO for aspiration -Has a sitter at bedside  REVIEW OF SYSTEMS:  Review of Systems  Unable to perform ROS: Mental status change    DRUG ALLERGIES:  No Known Allergies  VITALS:  Blood pressure (!) 169/102, pulse (!) 102, temperature 98.1 F (36.7 C), temperature source Oral, resp. rate 18, height 5\' 11"  (1.803 m), weight 110.4 kg, SpO2 90 %.  PHYSICAL EXAMINATION:  Physical Exam   GENERAL:  57 y.o.-year-old obese, patient lying in the bed with no acute distress.  EYES: Pupils equal, round, reactive to light and accommodation. No scleral icterus. Extraocular muscles intact.  HEENT: Head atraumatic, normocephalic. Oropharynx and nasopharynx clear.  NECK:  Supple, no jugular venous distention. No thyroid enlargement, no tenderness.  LUNGS: Normal breath sounds bilaterally, no wheezing, rales,rhonchi or crepitation. No use of accessory muscles of respiration.  Decreased bibasilar breath sounds CARDIOVASCULAR: S1, S2 normal. No , rubs, or gallops.  Loud 3/6 systolic murmur is present ABDOMEN: Soft, nontender, nondistended. Bowel sounds present. No organomegaly or mass.  EXTREMITIES: No pedal edema, cyanosis, or clubbing.  NEUROLOGIC: Cranial nerves II through XII are intact.  Able to follow commands due to sedation.  According to RN, slightly weaker on the right side.    Sensation intact. Gait not checked.  PSYCHIATRIC: The patient is drowsy and sedated SKIN: No obvious rash, lesion, or ulcer.    LABORATORY PANEL:   CBC Recent Labs  Lab 12/22/17 0520  WBC 18.0*  HGB 14.3  HCT 43.3  PLT 243    ------------------------------------------------------------------------------------------------------------------  Chemistries  Recent Labs  Lab 12/22/17 0520  NA 147*  K 3.9  CL 115*  CO2 25  GLUCOSE 185*  BUN 36*  CREATININE 1.10  CALCIUM 8.9   ------------------------------------------------------------------------------------------------------------------  Cardiac Enzymes Recent Labs  Lab 01-17-2018 2214  TROPONINI 0.04*   ------------------------------------------------------------------------------------------------------------------  RADIOLOGY:  Mr Brain Wo Contrast  Result Date: 2018/01/17 CLINICAL DATA:  57 year old male with previous stroke and residual left side deficit. Weakness and inability to walk onset this morning. EXAM: MRI HEAD WITHOUT CONTRAST TECHNIQUE: Multiplanar, multiecho pulse sequences of the brain and surrounding structures were obtained without intravenous contrast. COMPARISON:  Head CT earlier today.  Brain MRI 11/07/2016. FINDINGS: Brain: Large chronic left PICA territory infarct. There is left lateral medullary restricted diffusion today in an area of about 9 millimeters (series 5, image 11 and series 7, image 19) with only faint FLAIR hyperintensity. No associated hemorrhage or mass effect. No other restricted diffusion identified. Patchy encephalomalacia also in the right frontal operculum with chronic mild ex vacuo enlargement of the right lateral ventricle. Chronic patchy and confluent bilateral superimposed cerebral white matter T2 and FLAIR hyperintensity. Chronic microhemorrhage in the right occipital pole with mild cortical encephalomalacia at that site. No midline shift, mass effect, evidence of mass lesion, ventriculomegaly, extra-axial collection or acute intracranial hemorrhage. Cervicomedullary junction and pituitary are within normal limits. Vascular: Major intracranial vascular flow voids are stable since 2018. Generalized intracranial  artery tortuosity. Skull and upper cervical spine: Negative visible cervical spine. Visualized bone marrow signal is within normal limits. Sinuses/Orbits: Left gaze deviation. Mild to moderate paranasal sinus mucosal thickening. Other: Mastoid air cells are clear. Scalp and face  soft tissues appear negative. IMPRESSION: 1. Acute left lateral medullary infarct with no associated hemorrhage or mass effect. 2. Underlying advanced chronic left PICA territory ischemia. Chronic infarcts also in the right MCA and PCA territory, bilateral cerebral white matter. Electronically Signed   By: Odessa Fleming M.D.   On: January 13, 2018 19:29   Mr Maxine Glenn Head Wo Contrast  Result Date: 12/22/2017 CLINICAL DATA:  Acute left medullary CVA. EXAM: MRA HEAD WITHOUT CONTRAST TECHNIQUE: Angiographic images of the Circle of Willis were obtained using MRA technique without intravenous contrast. COMPARISON:  MRI brain 01/13/2018 FINDINGS: Atherosclerotic irregularity is present within the cavernous internal carotid arteries bilaterally without significant stenosis through the ICA termini. There is mild narrowing in the proximal right M1 segment and bilateral A1 segments. Mild distal left M1 segment narrowing is present. There is no significant proximal stenosis or occlusion. There is a high-grade stenosis of the proximal superior right M2 division. Mild diffuse branch vessel irregularity is present. There is moderate asymmetric attenuation of the right A3 division compared to the left. The vertebral arteries are codominant. There is moderate narrowing of both distal vertebral artery just proximal to the vertebrobasilar junction. There is moderate irregularity of the proximal basilar artery with a moderate for mid basilar stenosis. There is a high-grade stenosis the proximal right P1 and more distal left P2 segment with significant attenuation of PCA branch vessels, left greater than right. IMPRESSION: 1. Moderate mid basilar more proximal irregularity  is likely associated with the medullary infarct. 2. Mild narrowing of the distal vertebral arteries bilaterally, right greater than left. 3. Moderate proximal right P1 and more distal left P2 segment stenoses with significant attenuation of distal PCA branch vessels bilaterally. 4. High-grade stenosis of proximal superior right M2 division. 5. Moderate to high-grade stenosis of the proximal right A3 division. 6. Mild diffuse small vessel disease throughout the ACA and MCA territories otherwise. 7. Atherosclerotic changes through the cavernous internal carotid arteries bilaterally without significant stenosis. Electronically Signed   By: Marin Roberts M.D.   On: 12/22/2017 13:14    EKG:   Orders placed or performed during the hospital encounter of 2018-01-13  . EKG 12-Lead  . EKG 12-Lead  . ED EKG  . ED EKG    ASSESSMENT AND PLAN:   57 year old male with past medical history of bipolar disorder, previous CVA, diabetes, hypertension, hyperlipidemia, polysubstance abuse, medical noncompliance who presents to the hospital due to generalized weakness, difficulty ambulating.  1.  Acute stroke-MRI showing acute left medullary CVA, has slight right-sided weakness. -Appreciate neurology consult.  Patient on aspirin and Plavix -Echocardiogram showing no thrombus. -MRI of the head to be done today -Continue to encourage working with PT and OT  2.  Aspiration pneumonitis-dysphagia from stroke.  Has had aspiration episodes twice since admission -Currently n.p.o.  Appreciate speech therapy input -Continue Unasyn.  Elevated WBC -Might need modified barium swallow study within the next day or 2 -NG-tube to be placed for feeding until then  3.  Altered mental status-acute metabolic encephalopathy-from stroke and infection -On antibiotics -If no improvement, will repeat CT head in a.m.  4.  Diabetes-sliding scale insulin.  Has remained n.p.o. -Steroids can be discontinued  5.   Hypertension-blood pressure is elevated as unable to take oral medications.  Continue clonidine patch and IV hydralazine PRN -Patient also on Norvasc, lisinopril at home.  Meds can be restarted once NG is placed  6.  Acute urinary retention-noted in the emergency room.  Foley catheter in place. -  We will start Flomax once NG is placed.  We will do a voiding trial in a day or 2  7.  DVT prophylaxis-Lovenox   All the records are reviewed and case discussed with Care Management/Social Workerr. Management plans discussed with the patient, family and they are in agreement.  CODE STATUS: Full code  TOTAL TIME TAKING CARE OF THIS PATIENT: 38 minutes.   POSSIBLE D/C IN 2 DAYS, DEPENDING ON CLINICAL CONDITION.   Enid BaasKALISETTI,Carlen Rebuck M.D on 12/22/2017 at 3:25 PM  Between 7am to 6pm - Pager - [phone]  After 6pm go to [email or web address] - Social research officer, governmentpassword [email or web address] [email or web address]  [facility]  Office  [phone]  CC: Primary care physician; Preston Fleetingevelo, Adrian Mancheno, MD

## 2017-12-22 NOTE — Progress Notes (Signed)
Notified Dr. Anne HahnWillis pnt fell in room attempting to get out of the bed however no injury following fall. Pnts sister Ms. Lin GivensJeffries telephoned however not answer on home and cellphone. Left a voicemail providing my callback info. Post fall Vitals taken.

## 2017-12-23 ENCOUNTER — Inpatient Hospital Stay: Payer: Medicaid Other

## 2017-12-23 LAB — GLUCOSE, CAPILLARY
GLUCOSE-CAPILLARY: 134 mg/dL — AB (ref 70–99)
Glucose-Capillary: 140 mg/dL — ABNORMAL HIGH (ref 70–99)
Glucose-Capillary: 108 mg/dL — ABNORMAL HIGH (ref 70–99)
Glucose-Capillary: 111 mg/dL — ABNORMAL HIGH (ref 70–99)

## 2017-12-23 MED ORDER — ASPIRIN 300 MG RE SUPP
300.0000 mg | Freq: Every day | RECTAL | Status: DC
  Administered 2017-12-23 – 2017-12-26 (×4): 300 mg via RECTAL
  Filled 2017-12-23 (×4): qty 1

## 2017-12-23 MED ORDER — HALOPERIDOL LACTATE 5 MG/ML IJ SOLN
5.0000 mg | Freq: Once | INTRAMUSCULAR | Status: AC
  Administered 2017-12-23: 5 mg via INTRAVENOUS
  Filled 2017-12-23: qty 1

## 2017-12-23 MED ORDER — METOPROLOL TARTRATE 5 MG/5ML IV SOLN
5.0000 mg | Freq: Four times a day (QID) | INTRAVENOUS | Status: AC
  Administered 2017-12-23 – 2017-12-26 (×11): 5 mg via INTRAVENOUS
  Filled 2017-12-23 (×11): qty 5

## 2017-12-23 MED ORDER — HYDRALAZINE HCL 20 MG/ML IJ SOLN
10.0000 mg | Freq: Four times a day (QID) | INTRAMUSCULAR | Status: DC | PRN
  Administered 2017-12-23 – 2017-12-24 (×2): 10 mg via INTRAVENOUS
  Filled 2017-12-23 (×3): qty 1

## 2017-12-23 MED ORDER — SCOPOLAMINE 1 MG/3DAYS TD PT72
1.0000 | MEDICATED_PATCH | TRANSDERMAL | Status: DC
  Administered 2017-12-23 – 2017-12-26 (×2): 1.5 mg via TRANSDERMAL
  Filled 2017-12-23 (×3): qty 1

## 2017-12-23 MED ORDER — CLONIDINE HCL 0.3 MG/24HR TD PTWK
0.3000 mg | MEDICATED_PATCH | TRANSDERMAL | Status: DC
  Filled 2017-12-23: qty 1

## 2017-12-23 MED ORDER — GLYCOPYRROLATE 0.2 MG/ML IJ SOLN
0.1000 mg | INTRAMUSCULAR | Status: DC | PRN
  Administered 2017-12-25: 0.1 mg via INTRAVENOUS
  Filled 2017-12-23 (×2): qty 0.5

## 2017-12-23 MED ORDER — NITROGLYCERIN 0.3 MG/HR TD PT24
0.3000 mg | MEDICATED_PATCH | Freq: Every day | TRANSDERMAL | Status: DC
  Administered 2017-12-23 – 2017-12-26 (×4): 0.3 mg via TRANSDERMAL
  Filled 2017-12-23 (×5): qty 1

## 2017-12-23 NOTE — Progress Notes (Signed)
Patient appears agitated with abnormal breathing; having periods of apnea with uncontrollable hiccups. Morphine and Robinul given with no improvement. Crackles auscultated. MD called and notified of above. See new orders. Bo McclintockBrewer,Briley Bumgarner S, RN

## 2017-12-23 NOTE — Progress Notes (Signed)
SLP Cancellation Note  Patient Details Name: [name] LimLinnies Stollings Jr. MRN: [id number] DOB: January 14, 1961   Cancelled treatment:       Reason Eval/Treat Not Completed: Patient not medically ready;Fatigue/lethargy limiting ability to participate(chart reviewed; consulted MD, NSG re: pt's status). Per NSg, pt is having increased Apnea w/ mild agitation and congested breathing. RT (deep)suctioned pt then w/in 3-5 minutes, the wet laryngeal-pharyngeal sounding breathing returned - suspect increased secretions again. Pt is not coughing independently in attempt to clear the secretions. He also appears more lethargic this morning only responding to Mod verbal/tactile stim from the MD.  Recommend pt remain NPO d/t medical/neurological and respiratory issues which significantly increase his risk for aspiration at this time. MD agreed. Recommend frequent oral care for hygiene and oral stimulation for swallowing. ST services will f/u tomorrow.     Jerilynn SomKatherine Oriah Leinweber, MS, CCC-SLP Derris Millan 12/23/2017, 11:28 AM

## 2017-12-23 NOTE — Clinical Social Work Note (Signed)
CSW attempted to reach patient's family today to complete assesment. Patient's sister is unavailable. CSW has left 2 voicemails for her with no call back. CSW attempted to call patient's daughter but phone number listed is not working. CSW was informed by patient's friend that came to visit that his son was recently sent to jail and is not available as well. CSW did a wellness check on patient's sister since several staff have tried to contact her without success. CSW also notified [facility] [place] [place] [place] [facility] of potential need for guardianship. CSW will continue to follow for discharge planning. Complete assessment to follow once family is available.   Ruthe Mannanandace Elane Peabody MSW, [address]CSWA [phone]

## 2017-12-23 NOTE — Progress Notes (Signed)
PT Cancellation Note  Patient Details Name: [name] LimLinnies Lozada Jr. MRN: [id number] DOB: 16-Jan-1961   Cancelled Treatment:    Reason Eval/Treat Not Completed: Patient at procedure or test/unavailable PT will follow up as able.  [name] PT, DPT 11:29 AM,12/23/17  [phone]

## 2017-12-23 NOTE — Progress Notes (Signed)
OT Cancellation Note  Patient Details Name: [name] LimLinnies Bauserman Jr. MRN: [id number] DOB: 10-Feb-1960   Cancelled Treatment:    Reason Eval/Treat Not Completed: Medical issues which prohibited therapy. Chart reviewed. Pt with a decline in status from pervious date. Repeat head CT ordered. Will hold this date and re-attempt OT tx at later date/time as medically appropriate.  Richrd PrimeJamie Stiller, MPH, MS, OTR/L ascom [phone] 12/23/17, 12:11 PM

## 2017-12-23 NOTE — Progress Notes (Signed)
[facility] - [place] [facility]   PATIENT NAME: [name] MeigsLinnies [name]    MR#:  [id number]  DATE OF BIRTH:  07-04-1960  SUBJECTIVE:  CHIEF COMPLAINT:   Chief Complaint  Patient presents with  . Weakness   -Increased oropharyngeal secretions.  Very lethargic. - periods of apnea noted as well  REVIEW OF SYSTEMS:  Review of Systems  Unable to perform ROS: Mental status change    DRUG ALLERGIES:  No Known Allergies  VITALS:  Blood pressure (!) 174/123, pulse (!) 103, temperature 97.8 F (36.6 C), temperature source Oral, resp. rate 18, height 5\' 11"  (1.803 m), weight 110.4 kg, SpO2 97 %.  PHYSICAL EXAMINATION:  Physical Exam   GENERAL:  57 y.o.-year-old obese, patient lying in the bed with no acute distress. critically ill appearing EYES: Pupils equal, round, reactive to light and accommodation. No scleral icterus. Extraocular muscles intact.  HEENT: Head atraumatic, normocephalic. Oropharynx and nasopharynx clear.  NECK:  Supple, no jugular venous distention. No thyroid enlargement, no tenderness.  LUNGS: gurgling, rhonchorous upper airway secretions noted.  no wheezing, rales,or crepitation. No use of accessory muscles of respiration.  Decreased bibasilar breath sounds Occasional periods of apnea noted CARDIOVASCULAR: S1, S2 normal. No , rubs, or gallops.  Loud 3/6 systolic murmur is present ABDOMEN: Soft, nontender, nondistended. Bowel sounds present. No organomegaly or mass.  EXTREMITIES: No pedal edema, cyanosis, or clubbing.  NEUROLOGIC: minimal left facial droop, left sided hemi neglect seen. Lethargic- but opening eyes and nodding occasionally According to RN, slightly weaker on the right side.    Sensation intact. Gait not checked.  PSYCHIATRIC: The patient is drowsy and lethargic SKIN: No obvious rash, lesion, or ulcer.    LABORATORY PANEL:   CBC Recent Labs  Lab 12/22/17 0520  WBC 18.0*  HGB 14.3  HCT 43.3  PLT 243    ------------------------------------------------------------------------------------------------------------------  Chemistries  Recent Labs  Lab 12/22/17 0520  NA 147*  K 3.9  CL 115*  CO2 25  GLUCOSE 185*  BUN 36*  CREATININE 1.10  CALCIUM 8.9   ------------------------------------------------------------------------------------------------------------------  Cardiac Enzymes Recent Labs  Lab 12/28/2017 2214  TROPONINI 0.04*   ------------------------------------------------------------------------------------------------------------------  RADIOLOGY:  Ct Head Wo Contrast  Result Date: 12/23/2017 CLINICAL DATA:  Altered level of consciousness, acute stroke LEFT medullary, past history of stroke, hypertensive hypertrophic cardiomyopathy, coronary artery disease, diabetes mellitus, hypertension, smoker EXAM: CT HEAD WITHOUT CONTRAST TECHNIQUE: Contiguous axial images were obtained from the base of the skull through the vertex without intravenous contrast. Sagittal and coronal MPR images reconstructed from axial data set. COMPARISON:  12/21/2017 CT head and MR brain exams FINDINGS: Brain: Generalized atrophy. Normal ventricular morphology. No midline shift or mass effect. Small vessel chronic ischemic changes of deep cerebral white matter. Old LEFT cerebellar infarct. LEFT medullary infarcts seen on recent MR suboptimally visualized due to beam hardening artifacts at skull base, probably present on image 7. No intracranial hemorrhage, mass lesion, or additional infarction. No extra-axial fluid collections. Vascular: Atherosclerotic calcification of internal carotid and vertebral arteries at skull base Skull: Intact Sinuses/Orbits: Scattered mucosal thickening throughout ethmoid air cells. Small mucosal retention cyst LEFT maxillary sinus. Other: N/A IMPRESSION: Atrophy with small vessel chronic ischemic changes of deep cerebral white matter. Old LEFT cerebellar infarct. Infarct at LEFT  medulla, better visualized on prior MR. No new intracranial abnormalities. Electronically Signed   By: Ulyses SouthwardMark  Boles M.D.   On: 12/23/2017 11:54   Dg Chest Port 1 View  Result Date: 12/23/2017 CLINICAL  DATA:  Dyspnea. History of hypertrophic cardiomyopathy, aortic stenosis, coronary artery disease, previous CVA, diabetes, current smoker. EXAM: PORTABLE CHEST 1 VIEW COMPARISON:  Portable chest x-ray of 01-06-18 FINDINGS: The lungs are adequately inflated. There is no focal infiltrate. There is no pleural effusion. The cardiac silhouette is mildly enlarged. The mediastinum is normal in width. The trachea is midline. There is no pleural effusion. IMPRESSION: There is no active cardiopulmonary disease. Electronically Signed   By: David  [place] M.D.   On: 12/23/2017 10:34   Dg Abd Portable 1v  Result Date: 12/22/2017 CLINICAL DATA:  Nasogastric placement. EXAM: PORTABLE ABDOMEN - 1 VIEW COMPARISON:  09/13/2017 FINDINGS: Nasogastric tube tip is only in the distal third of the esophagus. Advance at least 10 cm to be in the stomach. IMPRESSION: Nasogastric tube tip only in the distal esophagus. Advanced at least 10 cm to be in the stomach. Electronically Signed   By: Paulina Fusi M.D.   On: 12/22/2017 16:17   Mr Maxine Glenn Head Wo Contrast  Result Date: 12/22/2017 CLINICAL DATA:  Acute left medullary CVA. EXAM: MRA HEAD WITHOUT CONTRAST TECHNIQUE: Angiographic images of the Circle of Willis were obtained using MRA technique without intravenous contrast. COMPARISON:  MRI brain 01/06/18 FINDINGS: Atherosclerotic irregularity is present within the cavernous internal carotid arteries bilaterally without significant stenosis through the ICA termini. There is mild narrowing in the proximal right M1 segment and bilateral A1 segments. Mild distal left M1 segment narrowing is present. There is no significant proximal stenosis or occlusion. There is a high-grade stenosis of the proximal superior right M2 division.  Mild diffuse branch vessel irregularity is present. There is moderate asymmetric attenuation of the right A3 division compared to the left. The vertebral arteries are codominant. There is moderate narrowing of both distal vertebral artery just proximal to the vertebrobasilar junction. There is moderate irregularity of the proximal basilar artery with a moderate for mid basilar stenosis. There is a high-grade stenosis the proximal right P1 and more distal left P2 segment with significant attenuation of PCA branch vessels, left greater than right. IMPRESSION: 1. Moderate mid basilar more proximal irregularity is likely associated with the medullary infarct. 2. Mild narrowing of the distal vertebral arteries bilaterally, right greater than left. 3. Moderate proximal right P1 and more distal left P2 segment stenoses with significant attenuation of distal PCA branch vessels bilaterally. 4. High-grade stenosis of proximal superior right M2 division. 5. Moderate to high-grade stenosis of the proximal right A3 division. 6. Mild diffuse small vessel disease throughout the ACA and MCA territories otherwise. 7. Atherosclerotic changes through the cavernous internal carotid arteries bilaterally without significant stenosis. Electronically Signed   By: Marin Roberts M.D.   On: 12/22/2017 13:14    EKG:   Orders placed or performed during the hospital encounter of January 06, 2018  . EKG 12-Lead  . EKG 12-Lead  . ED EKG  . ED EKG    ASSESSMENT AND PLAN:   57 year old male with past medical history of bipolar disorder, previous CVA, diabetes, hypertension, hyperlipidemia, polysubstance abuse, medical noncompliance who presents to the hospital due to generalized weakness, difficulty ambulating.  1.  Acute stroke-MRI showing acute left medullary CVA, has slight right-sided weakness. -Appreciate neurology consult.  Patient on aspirin and Plavix -Echocardiogram showing no thrombus. -MRA of the head and neck showing  stenosis at several levels of cranial arteries including basilar artery, posterior communicating artery, high-grade stenosis of right MCA and diffuse small vessel disease throughout ACA and MCA territories. -Unable  to take oral medications.  NG tube attempted but patient pulled out. -Aspirin suppository for now -PT and OT pending- as patient unable to participate now due to his mental status  2.  Aspiration pneumonitis-dysphagia from stroke.  Has had aspiration episodes twice since admission -Currently n.p.o.  Appreciate speech therapy input -Continue Unasyn.  Elevated WBC -NG tube attempted but patient pulled out -Now pooling secretions, dysphagia of oropharyngeal phase-will need PEG tube for nutrition. -Continue suctioning, scopolamine patch added  3.  Altered mental status-acute metabolic encephalopathy-from stroke and infection - concern for PRES from elevated BP- will work on getting BP controlled -On antibiotics -Repeat CT head with no acute findings  4.  Diabetes-sliding scale insulin.  Has remained n.p.o.  5.  Hypertension-blood pressure is elevated as unable to take oral medications.  \ -Increase clonidine patch to 0.3 mg, added Nitropatch -Continue IV hydralazine and IV labetalol.   6.  Acute urinary retention-noted in the emergency room.  Foley catheter in place. -We will start Flomax if able to take oral medicines  7.  DVT prophylaxis-Lovenox   If does not improve, overall poor prognosis. Palliative care consulted   All the records are reviewed and case discussed with Care Management/Social Workerr. Management plans discussed with the patient, family and they are in agreement.  CODE STATUS: Full code  TOTAL TIME TAKING CARE OF THIS PATIENT: 38 minutes.   POSSIBLE D/C IN 2 DAYS, DEPENDING ON CLINICAL CONDITION.   Enid Baas M.D on 12/23/2017 at 12:44 PM  Between 7am to 6pm - Pager - [phone]  After 6pm go to [email or web address] - Air traffic controller  [facility]  Office  [phone]  CC: Primary care physician; Preston Fleeting, MD

## 2017-12-23 NOTE — Progress Notes (Signed)
Patient ID: [name] LimLinnies Dell Jr., male   DOB: 05-28-1960, 57 y.o.   MRN: [id number]   Thank you for consulting the Palliative Medicine Team at [facility] to meet your patient's and family's needs.   The reason that you asked us to see your patient is  To help establish GOC  Patient is unable to make medical decisions for himself.  I attempted to contact family and was able to leave a phone voice message with the sister/ Elray McgregorAnn Jefferies.  Await callback.  Lorinda CreedMary  NP  Palliative Medicine Team Team Phone # [phone] Pager [phone]  No charge

## 2017-12-23 NOTE — Progress Notes (Signed)
PT Cancellation Note  Patient Details Name: [name] LimLinnies Macera Jr. MRN: [id number] DOB: 1960-12-27   Cancelled Treatment:    Reason Eval/Treat Not Completed: Other (comment);Patient's level of consciousness;Fatigue/lethargy limiting ability to participate(Chart reviewed. Pt with a decline in status as well as change in mental status from pervious date. Repeat head CT ordered. PT visualized pt, very lethargic, in bed. PT will hold until patient is more medically appropriate.)   Olga Coasteriana Deiona Hooper PT, DPT 1:32 PM,12/23/17 [phone]

## 2017-12-23 NOTE — Progress Notes (Signed)
Pt. Is currently not on cardiac telemetry. Pt. Has been agitated and combative at times  tonight. On call MD contacted about order & to see if order can be d/c'd.  Will continue to monitor.

## 2017-12-23 NOTE — Progress Notes (Signed)
Tried to reach family - unable t speak to anyone or leave a voicemail.

## 2017-12-23 NOTE — [place] FL2 (Signed)
MEDICAID FL2 LEVEL OF CARE SCREENING TOOL     IDENTIFICATION  Patient Name: [name] [name]. Birthdate: [birth year] Sex: male Admission Date (Current Location): 01/11/2018  [place] Number:  Chiropodist and Address:  [facility], [address] [place], [place] [address]      Provider Number: [id number]  Attending Physician Name and Address:  Enid Baas, MD  Relative Name and Phone Number:  Arvid Right- sister [phone]    Current Level of Care: Hospital Recommended Level of Care: Skilled Nursing Facility Prior Approval Number:    Date Approved/Denied:   PASRR Number: pending  Discharge Plan: SNF    Current Diagnoses: Patient Active Problem List   Diagnosis Date Noted  . Difficulty walking 01-11-2018  . GI bleed 11/07/2016  . Accelerated hypertension 06/07/2016  . GERD (gastroesophageal reflux disease) 06/07/2016  . Bipolar affective disorder (HCC) 06/07/2016  . Chest pain 06/07/2016  . Unspecified Depressive disorder 04/17/2016  . 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 Chronic diastolic CHF (congestive heart failure) (HCC) 10/02/2015  . Tobacco use disorder 11/07/2014  . Stimulant use disorder (HCC) crack cocaine 11/07/2014  . Alcohol use disorder, moderate, dependence (HCC) 11/07/2014  . CAD (coronary artery disease) 11/05/2014  . Cerebral infarction (HCC) 10/19/2014  . LVH (left ventricular hypertrophy) 05/20/2013  . Obesity (BMI 30-39.9) 04/10/2013  . Aortic stenosis, mild 03/07/2013  . DM (diabetes mellitus), type 2, uncontrolled (HCC) 01/05/2013  . Essential hypertension, malignant 01/25/2009    Orientation RESPIRATION BLADDER Height & Weight     Self  O2(2 liters ) Incontinent Weight: 243 lb 4.8 oz (110.4 kg) Height:  5\' 11"  (180.3 cm)  BEHAVIORAL SYMPTOMS/MOOD NEUROLOGICAL BOWEL NUTRITION STATUS  (none) (none) Continent Diet(NPO)  AMBULATORY  STATUS COMMUNICATION OF NEEDS Skin   Extensive Assist Verbally Normal                       Personal Care Assistance Level of Assistance  Bathing, Feeding, Dressing Bathing Assistance: Maximum assistance Feeding assistance: Limited assistance Dressing Assistance: Maximum assistance     Functional Limitations Info  Sight, Hearing, Speech Sight Info: Adequate Hearing Info: Adequate Speech Info: Adequate    SPECIAL CARE FACTORS FREQUENCY  PT (By licensed PT), OT (By licensed OT)     PT Frequency: 5 OT Frequency: 5            Contractures Contractures Info: Not present    Additional Factors Info  Code Status, Allergies Code Status Info: Full Code  Allergies Info: NKA           Current Medications (12/23/2017):  This is the current hospital active medication list Current Facility-Administered Medications  Medication Dose Route Frequency Provider Last Rate Last Dose  . 0.9 %  sodium chloride infusion   Intravenous Continuous Houston Siren, MD 75 mL/hr at 12/23/17 1006    . acetaminophen (TYLENOL) tablet 650 mg  650 mg Oral Q6H PRN Houston Siren, MD       Or  . acetaminophen (TYLENOL) suppository 650 mg  650 mg Rectal Q6H PRN Houston Siren, MD      . amLODipine (NORVASC) tablet 10 mg  10 mg Oral Daily Houston Siren, MD   10 mg at 12/21/17 5621  . Ampicillin-Sulbactam (UNASYN) 3 g in sodium chloride 0.9 % 100 mL IVPB  3 g Intravenous Q6H Ronnald Ramp, RPH 200 mL/hr at 12/23/17 1053 3 g at  12/23/17 1053  . aspirin suppository 300 mg  300 mg Rectal Daily Enid BaasKalisetti, Radhika, MD   300 mg at 12/23/17 1403  . atorvastatin (LIPITOR) tablet 20 mg  20 mg Oral q1800 Sainani, Vivek J, MD      . budesonide (PULMICORT) nebulizer solution 0.5 mg  0.5 mg Nebulization BID Houston SirenSainani, Vivek J, MD   0.5 mg at 12/23/17 1034  . chlorproMAZINE (THORAZINE) 25 mg in sodium chloride 0.9 % 25 mL IVPB  25 mg Intravenous Q8H PRN Enid BaasKalisetti, Radhika, MD 50 mL/hr at 12/23/17 1245 25 mg  at 12/23/17 1245  . [START ON 12/12/2017] cloNIDine (CATAPRES - Dosed in mg/24 hr) patch 0.3 mg  0.3 mg Transdermal Weekly Enid BaasKalisetti, Radhika, MD      . enoxaparin (LOVENOX) injection 40 mg  40 mg Subcutaneous Q24H Houston SirenSainani, Vivek J, MD   40 mg at 12/22/17 1706  . glycopyrrolate (ROBINUL) injection 0.1 mg  0.1 mg Intravenous Q4H PRN Enid BaasKalisetti, Radhika, MD      . haloperidol lactate (HALDOL) injection 1-2 mg  1-2 mg Intravenous Q6H PRN Enid BaasKalisetti, Radhika, MD   2 mg at 12/23/17 0353  . hydrALAZINE (APRESOLINE) injection 10 mg  10 mg Intravenous Q6H PRN Enid BaasKalisetti, Radhika, MD      . insulin aspart (novoLOG) injection 0-20 Units  0-20 Units Subcutaneous TID WC Houston SirenSainani, Vivek J, MD   3 Units at 12/23/17 1245  . insulin aspart (novoLOG) injection 0-5 Units  0-5 Units Subcutaneous QHS Sainani, Vivek J, MD      . ipratropium-albuterol (DUONEB) 0.5-2.5 (3) MG/3ML nebulizer solution 3 mL  3 mL Nebulization BID Enid BaasKalisetti, Radhika, MD   3 mL at 12/23/17 1033  . labetalol (NORMODYNE,TRANDATE) injection 10 mg  10 mg Intravenous Q4H PRN Enid BaasKalisetti, Radhika, MD   10 mg at 12/23/17 0033  . MEDLINE mouth rinse  15 mL Mouth Rinse BID Houston SirenSainani, Vivek J, MD   15 mL at 12/23/17 1006  . metoprolol tartrate (LOPRESSOR) injection 5 mg  5 mg Intravenous Q6H Kalisetti, Donnita Fallsadhika, MD      . morphine 2 MG/ML injection 2 mg  2 mg Intravenous Q4H PRN Enid BaasKalisetti, Radhika, MD   2 mg at 12/23/17 1406  . nitroGLYCERIN (NITRODUR - Dosed in mg/24 hr) patch 0.3 mg  0.3 mg Transdermal Daily Enid BaasKalisetti, Radhika, MD   0.3 mg at 12/23/17 1412  . ondansetron (ZOFRAN) tablet 4 mg  4 mg Oral Q6H PRN Houston SirenSainani, Vivek J, MD       Or  . ondansetron (ZOFRAN) injection 4 mg  4 mg Intravenous Q6H PRN Houston SirenSainani, Vivek J, MD      . scopolamine (TRANSDERM-SCOP) 1 MG/3DAYS 1.5 mg  1 patch Transdermal Q72H Enid BaasKalisetti, Radhika, MD   1.5 mg at 12/23/17 1410     Discharge Medications: Please see discharge summary for a list of discharge medications.  Relevant  Imaging Results:  Relevant Lab Results:   Additional Information SSN: [id number]  Ruthe MannanCandace  Kaori Jumper, [place]WA

## 2017-12-24 LAB — GLUCOSE, CAPILLARY
GLUCOSE-CAPILLARY: 114 mg/dL — AB (ref 70–99)
GLUCOSE-CAPILLARY: 134 mg/dL — AB (ref 70–99)
Glucose-Capillary: 115 mg/dL — ABNORMAL HIGH (ref 70–99)
Glucose-Capillary: 100 mg/dL — ABNORMAL HIGH (ref 70–99)

## 2017-12-24 NOTE — Clinical Social Work Note (Signed)
Clinical Social Work Assessment  Patient Details  Name: [name] LimLinnies Kliebert Jr. MRN: [id number] Date of Birth: 08-03-1960  Date of referral:  12/24/17               Reason for consult:  Facility Placement, Family Concerns                Permission sought to share information with:  Family Supports, Magazine features editoracility Contact Representative Permission granted to share information::  Yes, Verbal Permission Granted  Name::     Charise CarwinJeffries,Ann P Sister [phone]  [phone] or Kingsley Planride,[name] Daughter   [phone] or Marjorie Smolderride,[name] Son [phone]   Agency::  SNF admissions  Relationship::     Contact Information:     Housing/Transportation Living arrangements for the past 2 months:  Single Family Home Source of Information:  Siblings(Spoke to patient's sister) Patient Interpreter Needed:  None Criminal Activity/Legal Involvement Pertinent to Current Situation/Hospitalization:  No - Comment as needed Significant Relationships:  Adult Children, Siblings Lives with:  Self Do you feel safe going back to the place where you live?  No Need for family participation in patient care:  Yes (Comment)  Care giving concerns:  Patient's family feels that he needs to go to SNF before he is able to return back home, he may need long term care placement at SNF.   Social Worker assessment / plan:  Patient is a 57 year old male who is not alert and oriented.  CSW spoke to patient's sister to complete assessment.  Patient lives alone, and has history of drug use, patient has also had history of CVA.  Patient's sister reports that patient has been to SNF in the past and was at [facility], she would like him to return there if possible, also patient's father is a long term care resident [facility].  CSW explained to patient's sister because he only has Medicaid it's going to be limited on where he is able to be placed, patient's sister expressed understanding.  Patient's sister did not express any other  questions or concerns and gave CSW permission to begin bed search in [place].  Employment status:  Disabled (Comment on whether or not currently receiving Disability) Insurance information:  [place] Recommend[place]:  Skilled Nursing Facility Information / Referral to community resources:  Skilled Nursing Facility  Patient/Family's Response to care:  Patient's family is in agreement to having patient go to SNF.  Patient/Family's Understanding of and Emotional Response to Diagnosis, Current Treatment, and Prognosis:  Patient's family is aware of current treatment plan, and prognosis.  Emotional Assessment Appearance:  Appears stated age Attitude/Demeanor/Rapport:    Affect (typically observed):  Stable Orientation:    Alcohol / Substance use:  Illicit Drugs Psych involvement (Current and /or in the community):  No (Comment)  Discharge Needs  Concerns to be addressed:  Decision making concerns, Care Coordination, Substance Abuse Concerns, Lack of Support, Compliance Issues Concerns Readmission within the last 30 days:  No Current discharge risk:  Cognitively Impaired, Lack of support system, Substance Abuse, Lives alone Barriers to Discharge:  Continued Medical Work up, Requiring sitter/restraints   [place] Constablenterhaus, Evelise Reine R, LCSWA 12/24/2017, 5:54 PM

## 2017-12-24 NOTE — Progress Notes (Signed)
SLP Cancellation Note  Patient Details Name: [name] LimLinnies Feeser Jr. MRN: [id number] DOB: 1960/09/19   Cancelled treatment:       Reason Eval/Treat Not Completed: Patient not medically ready;Medical issues which prohibited therapy(Drowsy often as well). Pt is more awake today; able to open eyes and answer few basic questions re: self w/ head nod and phonation but no full words. Pt continues to present w/ Drowsiness and requires verbal/tactile cues at times to attend. He exhibits ongoing pharyngeal-laryngeal secretions, wet respirations. Upon assessment, no Gag reflex was noted but also noted brown secretions on tongue blade. Per MD note, pt continues to have apnea and hiccups(which were noted by this SLP during room visit also).  Discussed pt's presentation w/ MD and this SLP's concern for inability to manage any consistency of po d/t his presentation of inability to manage/clear his own secretions and the degree of oral weakness of the tongue for both speech and swallowing. Lack of Gag reflex w/ Max stim of tongue blade indicates degree of Neurological impact. MD agreed. No po's will be presented at this time. Recommend ongoing oral care for hygiene and oral stimulation for swallowing. MD is addressing the need for alternative means of feeding though pt pulled out the NG tube yesterday. Palliative Care is consulted. ST services will f/u w/ pt's status next 1-2 days.    Jerilynn SomKatherine Desare Duddy, MS, CCC-SLP [name] [name] 12/24/2017, 3:28 PM

## 2017-12-24 NOTE — Progress Notes (Signed)
PT Cancellation Note  Patient Details Name: Jeanice LimLinnies Gent Jr. MRN: [id number] DOB: [birth year]   Cancelled Treatment:    Reason Eval/Treat Not Completed: Fatigue/lethargy limiting ability to participate. Treatment attempted; sitter in room and pt sleeping soundly. Sitter notes pt has recently finally been able to sleep, as pt has been quite restless and agitated. Agreed to allow pt to sleep. Re attempt at a later date.    Scot DockHeidi E Hetty Linhart, PTA 12/24/2017, 12:08 PM

## 2017-12-24 NOTE — Progress Notes (Signed)
[facility] [place] [facility]   PATIENT NAME: [name] [name]    MR#:  [id number]  DATE OF BIRTH:  18-Jun-1960  SUBJECTIVE:  CHIEF COMPLAINT:   Chief Complaint  Patient presents with  . Weakness   -Continues to have increased oropharyngeal secretions.  More alert and following simple commands today.  - Continues to have periods of apnea and hiccups  REVIEW OF SYSTEMS:  Review of Systems  Unable to perform ROS: Mental status change    DRUG ALLERGIES:  No Known Allergies  VITALS:  Blood pressure (!) 163/97, pulse (!) 106, temperature 98.2 F (36.8 C), temperature source Oral, resp. rate 16, height 5\' 11"  (1.803 m), weight 110.4 kg, SpO2 (!) 89 %.  PHYSICAL EXAMINATION:  Physical Exam   GENERAL:  57 y.o.-year-old obese, patient lying in the bed with no acute distress.  ill appearing EYES: Pupils equal, round, reactive to light and accommodation. No scleral icterus. Extraocular muscles intact.  HEENT: Head atraumatic, normocephalic. Oropharynx and nasopharynx clear.  NECK:  Supple, no jugular venous distention. No thyroid enlargement, no tenderness.  LUNGS: gurgling, rhonchorous upper airway secretions noted.  no wheezing, rales,or crepitation. No use of accessory muscles of respiration.  Decreased bibasilar breath sounds Occasional periods of apnea noted CARDIOVASCULAR: S1, S2 normal. No , rubs, or gallops.  Loud 3/6 systolic murmur is present ABDOMEN: Soft, nontender, nondistended. Bowel sounds present. No organomegaly or mass.  EXTREMITIES: No pedal edema, cyanosis, or clubbing.  NEUROLOGIC: minimal left facial droop, left sided hemi neglect seen. Slightly weaker on the right side with drift noted.  Left-sided hemineglect noted.  Unable to protect airway with gurgling secretions. No gag reflex or cough reflex PSYCHIATRIC: The patient is alert and following simple commands.  SKIN: No obvious rash, lesion, or ulcer.    LABORATORY PANEL:    CBC Recent Labs  Lab 12/22/17 0520  WBC 18.0*  HGB 14.3  HCT 43.3  PLT 243   ------------------------------------------------------------------------------------------------------------------  Chemistries  Recent Labs  Lab 12/22/17 0520  NA 147*  K 3.9  CL 115*  CO2 25  GLUCOSE 185*  BUN 36*  CREATININE 1.10  CALCIUM 8.9   ------------------------------------------------------------------------------------------------------------------  Cardiac Enzymes Recent Labs  Lab 12/29/2017 2214  TROPONINI 0.04*   ------------------------------------------------------------------------------------------------------------------  RADIOLOGY:  Ct Head Wo Contrast  Result Date: 12/23/2017 CLINICAL DATA:  Altered level of consciousness, acute stroke LEFT medullary, past history of stroke, hypertensive hypertrophic cardiomyopathy, coronary artery disease, diabetes mellitus, hypertension, smoker EXAM: CT HEAD WITHOUT CONTRAST TECHNIQUE: Contiguous axial images were obtained from the base of the skull through the vertex without intravenous contrast. Sagittal and coronal MPR images reconstructed from axial data set. COMPARISON:  12/18/2017 CT head and MR brain exams FINDINGS: Brain: Generalized atrophy. Normal ventricular morphology. No midline shift or mass effect. Small vessel chronic ischemic changes of deep cerebral white matter. Old LEFT cerebellar infarct. LEFT medullary infarcts seen on recent MR suboptimally visualized due to beam hardening artifacts at skull base, probably present on image 7. No intracranial hemorrhage, mass lesion, or additional infarction. No extra-axial fluid collections. Vascular: Atherosclerotic calcification of internal carotid and vertebral arteries at skull base Skull: Intact Sinuses/Orbits: Scattered mucosal thickening throughout ethmoid air cells. Small mucosal retention cyst LEFT maxillary sinus. Other: N/A IMPRESSION: Atrophy with small vessel chronic ischemic  changes of deep cerebral white matter. Old LEFT cerebellar infarct. Infarct at LEFT medulla, better visualized on prior MR. No new intracranial abnormalities. Electronically Signed   By: Angelyn Punt.D.  On: 12/23/2017 11:54   Dg Chest Port 1 View  Result Date: 12/23/2017 CLINICAL DATA:  Dyspnea. History of hypertrophic cardiomyopathy, aortic stenosis, coronary artery disease, previous CVA, diabetes, current smoker. EXAM: PORTABLE CHEST 1 VIEW COMPARISON:  Portable chest x-ray of December 20, 2017 FINDINGS: The lungs are adequately inflated. There is no focal infiltrate. There is no pleural effusion. The cardiac silhouette is mildly enlarged. The mediastinum is normal in width. The trachea is midline. There is no pleural effusion. IMPRESSION: There is no active cardiopulmonary disease. Electronically Signed   By: David  Swazil[place]Jordan M.D.   On: 12/23/2017 10:34   Dg Abd Portable 1v  Result Date: 12/22/2017 CLINICAL DATA:  Nasogastric placement. EXAM: PORTABLE ABDOMEN - 1 VIEW COMPARISON:  09/13/2017 FINDINGS: Nasogastric tube tip is only in the distal third of the esophagus. Advance at least 10 cm to be in the stomach. IMPRESSION: Nasogastric tube tip only in the distal esophagus. Advanced at least 10 cm to be in the stomach. Electronically Signed   By: Paulina FusiMark  Shogry M.D.   On: 12/22/2017 16:17    EKG:   Orders placed or performed during the hospital encounter of 12/26/2017  . EKG 12-Lead  . EKG 12-Lead  . ED EKG  . ED EKG    ASSESSMENT AND PLAN:   57 year old male with past medical history of bipolar disorder, previous CVA, diabetes, hypertension, hyperlipidemia, polysubstance abuse, medical noncompliance who presents to the hospital due to generalized weakness, difficulty ambulating.  1.  Acute stroke-MRI showing acute left medullary CVA, has slight right-sided weakness.  -Also right-sided weakness.  Pharyngeal weakness and unable to swallow oral secretions.  Pooling of secretions noted  requiring chest physical therapy and frequent suctioning. -Also hiccups and apneic period's-cranial nerve deficits. -Appreciate neurology consult.  Patient on aspirin and Plavix -Echocardiogram showing no thrombus. 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 -Unable to take oral medications.  NG tube attempted but patient pulled out. -Aspirin suppository for now -PT and OT pending- as patient unable to participate -will need SNF at discharge  2.  Aspiration pneumonitis-dysphagia from stroke.  Has had aspiration episodes twice since admission -Currently n.p.o.  Appreciate speech therapy input -Continue Unasyn stop after 7 days.  Elevated WBC -NG tube attempted but patient pulled out -Now pooling secretions, dysphagia of oropharyngeal phase-will need PEG tube for nutrition. -Continue suctioning, scopolamine patch added  3.  Altered mental status-acute metabolic encephalopathy-from stroke and infection - concern for PRES from elevated BP- will work on getting BP controlled -On antibiotics -Repeat CT head with no acute findings -Improving mental status, patient is alert and following simple commands.  4.  Diabetes-sliding scale insulin.  Has remained n.p.o.  5.  Hypertension-blood pressure is elevated as unable to take oral medications.  \ -Increase clonidine patch to 0.3 mg, added Nitropatch -Continue IV hydralazine and IV labetalol.   6.  Acute urinary retention-noted in the emergency room.  Foley catheter in place. -We will start Flomax if able to take oral medicines  7.  DVT prophylaxis-Lovenox   If does not improve, overall poor prognosis. Palliative care consulted   All the records are reviewed and case discussed with Care Management/Social Workerr. Management plans discussed with  the patient, family and they are in agreement.  CODE STATUS: Full code  TOTAL TIME TAKING CARE OF THIS PATIENT: 3838  minutes.   POSSIBLE D/C IN 2 DAYS, DEPENDING ON CLINICAL CONDITION.   Enid Baas M.D on 12/24/2017 at 2:36 PM  Between 7am to 6pm - Pager - [phone]  After 6pm go to [email or web address] - Social research officer, government  [facility]  Office  [phone]  CC: Primary care physician; Preston Fleeting, MD

## 2017-12-24 NOTE — Clinical Social Work Note (Signed)
CSW spoke with patient's sister [name] [name] [phone] or [phone] to discuss bed offers.  Patient only has one bed offer which is [facility] in [place].  Patient's sister is requesting [facility], CSW explained to patient's sister that because he only has Medicaid, it is very limited on which facilities can accept patient.  CSW explained that facilities only have a limited amount of Medicaid beds, so it will limit availability.  Patient's sister asked if CSW can check again with [facility], CSW called [facility], and they are reviewing patient to decide if they can offer a bed or not.  CSW to continue to follow patient's progress throughout discharge planning.  [name] KnackEric R. Hassan [name], MSW, Theresia MajorsLCSWA [phone]  12-24-17 10:30am

## 2017-12-24 NOTE — Progress Notes (Signed)
Pt. Will not tolerate CPAP. Pt. Grabs and pulls at anything placed over face becoming agitated and combative.

## 2017-12-25 ENCOUNTER — Inpatient Hospital Stay: Payer: Medicaid Other

## 2017-12-25 DIAGNOSIS — I639 Cerebral infarction, unspecified: Secondary | ICD-10-CM

## 2017-12-25 DIAGNOSIS — F332 Major depressive disorder, recurrent severe without psychotic features: Secondary | ICD-10-CM

## 2017-12-25 DIAGNOSIS — Z66 Do not resuscitate: Secondary | ICD-10-CM

## 2017-12-25 DIAGNOSIS — Z515 Encounter for palliative care: Secondary | ICD-10-CM

## 2017-12-25 DIAGNOSIS — F05 Delirium due to known physiological condition: Secondary | ICD-10-CM

## 2017-12-25 DIAGNOSIS — R1319 Other dysphagia: Secondary | ICD-10-CM

## 2017-12-25 DIAGNOSIS — R410 Disorientation, unspecified: Secondary | ICD-10-CM

## 2017-12-25 LAB — CBC
HCT: 50.4 % (ref 39.0–52.0)
Hemoglobin: 15.9 g/dL (ref 13.0–17.0)
MCH: 29.6 pg (ref 26.0–34.0)
MCHC: 31.5 g/dL (ref 30.0–36.0)
MCV: 93.9 fL (ref 80.0–100.0)
NRBC: 0 % (ref 0.0–0.2)
PLATELETS: 255 10*3/uL (ref 150–400)
RBC: 5.37 MIL/uL (ref 4.22–5.81)
RDW: 13.6 % (ref 11.5–15.5)
WBC: 13 10*3/uL — ABNORMAL HIGH (ref 4.0–10.5)

## 2017-12-25 LAB — BASIC METABOLIC PANEL
ANION GAP: 9 (ref 5–15)
BUN: 31 mg/dL — ABNORMAL HIGH (ref 6–20)
CO2: 27 mmol/L (ref 22–32)
Calcium: 8.6 mg/dL — ABNORMAL LOW (ref 8.9–10.3)
Chloride: 116 mmol/L — ABNORMAL HIGH (ref 98–111)
Creatinine, Ser: 1 mg/dL (ref 0.61–1.24)
GFR calc Af Amer: >60 mL/min (ref >60)
GLUCOSE: 114 mg/dL — AB (ref 70–99)
Potassium: 3.7 mmol/L (ref 3.5–5.1)
SODIUM: 152 mmol/L — AB (ref 135–145)

## 2017-12-25 LAB — GLUCOSE, CAPILLARY
GLUCOSE-CAPILLARY: 106 mg/dL — AB (ref 70–99)
GLUCOSE-CAPILLARY: 106 mg/dL — AB (ref 70–99)
Glucose-Capillary: 152 mg/dL — ABNORMAL HIGH (ref 70–99)
Glucose-Capillary: 131 mg/dL — ABNORMAL HIGH (ref 70–99)

## 2017-12-25 MED ORDER — DEXTROSE-NACL 5-0.45 % IV SOLN
INTRAVENOUS | Status: AC
  Administered 2017-12-25: 15:00:00 via INTRAVENOUS

## 2017-12-25 MED ORDER — MIRTAZAPINE 15 MG PO TBDP
15.0000 mg | ORAL_TABLET | Freq: Every day | ORAL | Status: DC
  Filled 2017-12-25 (×3): qty 1

## 2017-12-25 MED ORDER — BISACODYL 10 MG RE SUPP
10.0000 mg | Freq: Every day | RECTAL | Status: DC | PRN
  Filled 2017-12-25: qty 1

## 2017-12-25 MED ORDER — GLYCOPYRROLATE 0.2 MG/ML IJ SOLN
0.2000 mg | Freq: Four times a day (QID) | INTRAMUSCULAR | Status: DC
  Administered 2017-12-25 (×3): 0.2 mg via INTRAVENOUS
  Filled 2017-12-25 (×4): qty 1

## 2017-12-25 MED ORDER — HYDRALAZINE HCL 20 MG/ML IJ SOLN
10.0000 mg | Freq: Four times a day (QID) | INTRAMUSCULAR | Status: DC
  Administered 2017-12-25 – 2017-12-26 (×5): 10 mg via INTRAVENOUS
  Filled 2017-12-25 (×5): qty 1

## 2017-12-25 NOTE — Progress Notes (Signed)
Subjective: Patient remains dysarthric and has difficulty swallowing.  Has also been agitated and combative.  Sister at bedside today and reports that patient has a history of psychiatric disease.    Objective: Current vital signs: BP (!) 141/104   Pulse 69   Temp 98.3 F (36.8 C) (Oral)   Resp 16   Ht 5\' 11"  (1.803 m)   Wt 110.4 kg   SpO2 96%   BMI 33.93 kg/m  Vital signs in last 24 hours: Temp:  [98.3 F (36.8 C)-99.9 F (37.7 C)] 98.3 F (36.8 C) (11/21 0731) Pulse Rate:  [69-101] 69 (11/21 0733) BP: (141-220)/(94-140) 141/104 (11/21 0733) SpO2:  [90 %-97 %] 96 % (11/21 0805)  Intake/Output from previous day: 11/20 0701 - 11/21 0700 In: 3160.9 [I.V.:1630.2; IV Piggyback:1530.7] Out: 3150 [Urine:3150] Intake/Output this shift: No intake/output data recorded. Nutritional status:  Diet Order            Diet NPO time specified  Diet effective now              Neurologic Exam: Mental Status: Alert.  Nods head appropriately to questions being asked.  Severe dysarthria.  Follows commands without difficulty. Cranial Nerves: II: Blinks to bilateral confrontation.   III,IV, VI: ptosis not present, extra-ocular motions intact bilaterally V,VII: Left facial droop VIII: hearing normal bilaterally IX,X: gag reflex reduced XI: bilateral shoulder shrug XII: midline tongue extension Motor: Able to lift all extremities against gravity with no focal weakness noted.     Lab Results: Basic Metabolic Panel: Recent Labs  Lab 12/28/2017 1038 12/21/17 0245 12/22/17 0520 12/25/17 0608  NA 140 143 147* 152*  K 4.0 3.8 3.9 3.7  CL 107 110 115* 116*  CO2 23 23 25 27   GLUCOSE 200* 225* 185* 114*  BUN 20 25* 36* 31*  CREATININE 1.16 1.35* 1.10 1.00  CALCIUM 9.3 9.2 8.9 8.6*    Liver Function Tests: No results for input(s): AST, ALT, ALKPHOS, BILITOT, PROT, ALBUMIN in the last 168 hours. No results for input(s): LIPASE, AMYLASE in the last 168 hours. No results for  input(s): AMMONIA in the last 168 hours.  CBC: Recent Labs  Lab 12/07/2017 1038 12/21/17 0245 12/22/17 0520 12/25/17 0608  WBC 10.7* 17.6* 18.0* 13.0*  NEUTROABS  --   --  15.4*  --   HGB 15.3 14.6 14.3 15.9  HCT 45.4 42.3 43.3 50.4  MCV 87.8 87.0 90.6 93.9  PLT 243 243 243 255    Cardiac Enzymes: Recent Labs  Lab 12/26/2017 1038 12/07/2017 1533 12/19/2017 1835 12/12/2017 2214  TROPONINI 0.04* 0.04* 0.04* 0.04*    Lipid Panel: Recent Labs  Lab 12/21/17 0245  CHOL 104  TRIG 47  HDL 38*  CHOLHDL 2.7  VLDL 9  LDLCALC 57    CBG: Recent Labs  Lab 12/24/17 1222 12/24/17 1719 12/24/17 2048 12/25/17 0726 12/25/17 1151  GLUCAP 134* 115* 100* 106* 152*    Microbiology: Results for orders placed or performed during the hospital encounter of 11/07/16  MRSA PCR Screening     Status: None   Collection Time: 11/07/16  5:30 PM  Result Value Ref Range Status   MRSA by PCR NEGATIVE NEGATIVE Final    Comment:        The GeneXpert MRSA Assay (FDA approved for NASAL specimens only), is one component of a comprehensive MRSA colonization surveillance program. It is not intended to diagnose MRSA infection nor to guide or monitor treatment for MRSA infections.     Coagulation  Studies: No results for input(s): LABPROT, INR in the last 72 hours.  Imaging: No results found.  Medications:  I have reviewed the patient's current medications. Scheduled: . amLODipine  10 mg Oral Daily  . aspirin  300 mg Rectal Daily  . atorvastatin  20 mg Oral q1800  . budesonide (PULMICORT) nebulizer solution  0.5 mg Nebulization BID  . [START ON Jan 13, 2018] cloNIDine  0.3 mg Transdermal Weekly  . enoxaparin (LOVENOX) injection  40 mg Subcutaneous Q24H  . glycopyrrolate  0.2 mg Intravenous QID  . insulin aspart  0-20 Units Subcutaneous TID WC  . insulin aspart  0-5 Units Subcutaneous QHS  . ipratropium-albuterol  3 mL Nebulization BID  . mouth rinse  15 mL Mouth Rinse BID  . metoprolol  tartrate  5 mg Intravenous Q6H  . nitroGLYCERIN  0.3 mg Transdermal Daily  . scopolamine  1 patch Transdermal Q72H    Assessment/Plan: Patient s/p acute lateral medullary infarct.  Has not been able to take po since admission.  Has also developed combativeness and agitation as well.  History of psych issues.  They now appear to be interfering with his care since patient has removed his NGT and IV's.  LDL 57.  A1c 7.1. Echocardiogram shows no cardiac source of emboli with an EF of 55-65%.  Recommendations: 1.  Psych consult for help with medications to control agitation 2.  GI consult for placing PEG 3.  Continued therapy as tolerated.   4.  Continue ASA  5.  Blood sugar management with target A1c<7.0 6.  Carotid dopplers   LOS: 5 days   Thana Farr, MD Neurology [phone] 12/25/2017  2:07 PM

## 2017-12-25 NOTE — Progress Notes (Addendum)
Physical Therapy Treatment Patient Details Name: [name] LimLinnies Bertran Jr. MRN: [id number] DOB: 05-Sep-1960 Today's Date: 12/25/2017    History of Present Illness [name] [name]  is a 57 y.o. male with a known history of previous CVA, aortic stenosis, bipolar disorder, diabetes, GERD, medical noncompliance, polysubstance abuse who presents to the hospital from home due to weakness, difficulty ambulating.  Patient presently is a poor historian given his respiratory distress as he has aspirated while here in the emergency room.  Most of the history obtained from the family at bedside and from the ER physician.  Patient has a previous history of stroke and usually is able to use a walker and get around but today was having significant weakness and could not ambulate and fell to the ground    PT Comments    New PT orders noted in today.  BP taken prior to session.  155/124 R arm, 150/108 R leg.  Discussed with RN regarding BP and activity level. Pt is asking to get out of bed to recliner as he has been in bed for several days and is becoming uncomfortable and agitated in bed.  Request to get pt up in recliner.  To edge of bed with min assist.  Once sitting, he was able to sit with supervision but did have one post LOB requiring min assist to return to upright.  He was able to stand with mod a x 2 with much encouragement to stand fully and extend bilateral knees.  Once standing, he was able to sidestep to recliner with min/mod a x 2.  No buckling noted.  Further activity was deferred at this time due to BP concerns.  Sitter in room and encouraged to get pt back to bed before he was fatigued and needed increased assist.  Voiced understanding.    Will schedule pt with PT next visit to adjust frequency and POC as appropriate.   Follow Up Recommendations  SNF     Equipment Recommendations       Recommendations for Other Services       Precautions / Restrictions Precautions Precautions: Fall Precaution  Comments: Watch BP Restrictions Weight Bearing Restrictions: No    Mobility  Bed Mobility Overal bed mobility: Needs Assistance Bed Mobility: Supine to Sit     Supine to sit: Min assist        Transfers Overall transfer level: Needs assistance Equipment used: 2 person hand held assist Transfers: Sit to/from Stand           General transfer comment: increased assist needed today with mod verbal cues to stand up fully and extend knees  Ambulation/Gait Ambulation/Gait assistance: Mod assist;+2 physical assistance Gait Distance (Feet): 3 Feet Assistive device: None Gait Pattern/deviations: Step-to pattern     General Gait Details: decreased   Stairs             Wheelchair Mobility    Modified Rankin (Stroke Patients Only)       Balance Overall balance assessment: Needs assistance;History of Falls Sitting-balance support: Bilateral upper extremity supported Sitting balance-Leahy Scale: Poor Sitting balance - Comments: needs supervision and min assist at times for post lean   Standing balance support: Bilateral upper extremity supported Standing balance-Leahy Scale: Poor Standing balance comment: unsteady,                            Cognition Arousal/Alertness: Lethargic;Suspect due to medications Behavior During Therapy: Restless;Flat affect;Impulsive  General Comments: Has a sitter due to hx of agitation and in need of Haldol and now Thorazine for hiccups.      Exercises      General Comments        Pertinent Vitals/Pain Pain Assessment: No/denies pain    Home Living                      Prior Function            PT Goals (current goals can now be found in the care plan section) Progress towards PT goals: Not progressing toward goals - comment    Frequency    Min 2X/week      PT Plan Frequency needs to be updated    Co-evaluation               AM-PAC PT "6 Clicks" Daily Activity  Outcome Measure  Difficulty turning over in bed (including adjusting bedclothes, sheets and blankets)?: A Little Difficulty moving from lying on back to sitting on the side of the bed? : A Little Difficulty sitting down on and standing up from a chair with arms (e.g., wheelchair, bedside commode, etc,.)?: Unable Help needed moving to and from a bed to chair (including a wheelchair)?: A Lot Help needed walking in hospital room?: Total Help needed climbing 3-5 steps with a railing? : Total 6 Click Score: 11    End of Session Equipment Utilized During Treatment: Gait belt;Other (comment)   Patient left: in chair;with call bell/phone within reach;with chair alarm set;with nursing/sitter in room Nurse Communication: Mobility status;Other (comment)       Time: 9604-5409 PT Time Calculation (min) (ACUTE ONLY): 16 min  Charges:  $Therapeutic Activity: 8-22 mins                     Danielle Dess, PTA 12/25/17, 11:57 AM

## 2017-12-25 NOTE — Consult Note (Signed)
[name] Face-to-Face Psychiatry Consult   Reason for Consult: Consult for this 57 year old man with a history of depression and substance abuse who is now recovering from a stroke and having some agitation Referring Physician: Tressia Miners Patient Identification: [name] [name]. MRN:  [id number] Principal Diagnosis: Subacute delirium Diagnosis:  Principal Problem:   Subacute delirium Active Problems:   Difficulty walking   Other dysphagia   DNR (do not resuscitate)   Palliative care by specialist   Cerebrovascular accident (CVA) ([place])   Severe recurrent major depression without psychotic features ([place])   Total Time spent with patient: 1 hour  Subjective:   [name] [name]. is a 57 y.o. male patient admitted with patient is not able to speak enough to give specific information.  HPI: Patient seen.  Chart reviewed.  Patient known from previous encounters.  57 year old man with a history of coronary artery disease and prior strokes came into the hospital with altered mental status and appears to have had a new stroke.  Patient is still recovering but remains extremely impaired.  He has been unable to swallow safely and has a lot of difficulty communicating with very limited movement.  Patient has had some intermittent agitation at times.  Patient was seen in his room and he was awake.  He was able to nod his head or shake his head inappropriate answers to a few questions.  He indicated that he was not having pain.  He did indicate that he was not having any hallucinations.  Denied suicidal thoughts.  Patient denied feeling like he was particularly agitated.  He denied that he had been drinking recently before coming into the hospital or using any drugs recently.  Social history: Patient has siblings who are involved I believe is his closest family.  Presumably he will be going to a rehab of some sort once he leaves the hospital.  Medical history: Multiple medical problems including history of  coronary artery disease and aortic stenosis diabetes prior strokes  Substance abuse history: Long-standing recurrent problems with alcohol abuse that have complicated and often driven his mood problems  Past Psychiatric History: Patient has been seen many times before on the psychiatric service.  History of symptoms of depression.  Some suicidal threats in the past.  Much of his depression seems to be tied to his ongoing substance abuse.  No clear diagnosis of a psychotic disorder in the past.  Most of the medications a look like they have been of some help were targeting depression and anxiety such as serotonin reuptake inhibitors  Risk to Self:   Risk to Others:   Prior Inpatient Therapy:   Prior Outpatient Therapy:    Past Medical History:  Past Medical History:  Diagnosis Date  . Aortic stenosis, mild February 2015  . Auditory hallucinations   . Bipolar disorder ([place])   . Coronary artery disease    No reported catheterization. Negative Myoview stress test and 2013.  Marland Kitchen Depression   . Diabetes mellitus   . GERD (gastroesophageal reflux disease)   . History of suicide attempt   . Hx of medication noncompliance   . Hypertension   . Hypertensive hypertrophic cardiomyopathy [facility]) February 2015   Severe concentric LVH on echocardiogram.  . Obesity, Class II, BMI 35-39.9, with comorbidity   . Pericarditis   . Polysubstance abuse ([place])    cocaine, marijuana  . Stroke [facility]) February 2015   Right MCA aneurysm; head MRI - acute infarct of the right frontal lobe. Smaller acute infarct  of the left frontal lobe. Thought to be due to cerebral emboli. Left PICA infarct. Small chronic lacunar infarct right internal capsule. Chronic microvascular ischemic change with white matter change.  . Syncope and collapse     Past Surgical History:  Procedure Laterality Date  . Carotid Dopplers:  03/23/2013   Interval thickening of bilateral common carotid no significant plaque formation. Tortuosity but  no significant stenosis. Patent bilateral vertebral  arteries  . NM MYOVIEW LTD  July 2013   No ischemia or infarct. There is inferior wall defec suggestive of diaphragmatic attenuation. Mild LV dilation at to be due to hypertensive heart disease. EF 55%. Poor exercise tolerance.  Marland Kitchen PERICARDIAL FLUID DRAINAGE    . TRANSTHORACIC ECHOCARDIOGRAM  03/23/2013   EF 5560%. Elevated left atrial pressure. Severe concentric LVH. Mild aortic stenosis. -- Appearance of hypertensive hypertrophic heart disease.  No clear cardioembolic source   Family History:  Family History  Problem Relation Age of Onset  . Stroke Mother   . Diabetes type II Mother   . Hypertension Mother   . Hypertension Father   . Arthritis Father   . Arthritis Sister   . Diabetes type II Sister   . Hypertension Sister    Family Psychiatric  History: None known Social History:  Social History   Substance and Sexual Activity  Alcohol Use No  . Alcohol/week: 43.0 standard drinks  . Types: 3 Glasses of wine, 40 Cans of beer per week   Comment: occasionally; reports 1 pint of vodka per month at paydaysocially     Social History   Substance and Sexual Activity  Drug Use Yes  . Frequency: 1.0 times per week  . Types: Marijuana, Cocaine   Comment: past use; denies current; UDS positive for cocaine    Social History   Socioeconomic History  . Marital status: Single    Spouse name: Not on file  . Number of children: Not on file  . Years of education: Not on file  . Highest education level: Not on file  Occupational History  . Not on file  Social Needs  . Financial resource strain: Not on file  . Food insecurity:    Worry: Not on file    Inability: Not on file  . Transportation needs:    Medical: Not on file    Non-medical: Not on file  Tobacco Use  . Smoking status: Current Every Day Smoker    Packs/day: 1.00    Years: 40.00    Pack years: 40.00    Types: Cigars, Cigarettes  . Smokeless tobacco: Never Used   Substance and Sexual Activity  . Alcohol use: No    Alcohol/week: 43.0 standard drinks    Types: 3 Glasses of wine, 40 Cans of beer per week    Comment: occasionally; reports 1 pint of vodka per month at paydaysocially  . Drug use: Yes    Frequency: 1.0 times per week    Types: Marijuana, Cocaine    Comment: past use; denies current; UDS positive for cocaine  . Sexual activity: Yes  Lifestyle  . Physical activity:    Days per week: Not on file    Minutes per session: Not on file  . Stress: Not on file  Relationships  . Social connections:    Talks on phone: Not on file    Gets together: Not on file    Attends religious service: Not on file    Active member of club or organization: Not on file  Attends meetings of clubs or organizations: Not on file    Relationship status: Not on file  Other Topics Concern  . Not on file  Social History Narrative   Single with no children.  No exercise.   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   Has a history of polysubstance abuse including cocaine. No she she urine toxicology screen in the hospital was positive.  He also current smoker.   He is seen by behavioral health for bipolar disorder.   Additional Social History:    Allergies:  No Known Allergies  Labs:  Results for orders placed or performed during the hospital encounter of 12/09/2017 (from the past 48 hour(s))  Glucose, capillary     Status: Abnormal   Collection Time: 12/23/17  8:55 PM  Result Value Ref Range   Glucose-Capillary 111 (H) 70 - 99 mg/dL   Comment 1 Notify RN   Glucose, capillary     Status: Abnormal   Collection Time: 12/24/17  7:39 AM  Result Value Ref Range   Glucose-Capillary 114 (H) 70 - 99 mg/dL  Glucose, capillary     Status: Abnormal   Collection Time: 12/24/17 12:22 PM  Result Value Ref Range   Glucose-Capillary 134 (H) 70 - 99 mg/dL  Glucose, capillary     Status: Abnormal   Collection Time: 12/24/17   5:19 PM  Result Value Ref Range   Glucose-Capillary 115 (H) 70 - 99 mg/dL  Glucose, capillary     Status: Abnormal   Collection Time: 12/24/17  8:48 PM  Result Value Ref Range   Glucose-Capillary 100 (H) 70 - 99 mg/dL  CBC     Status: Abnormal   Collection Time: 12/25/17  6:08 AM  Result Value Ref Range   WBC 13.0 (H) 4.0 - 10.5 K/uL   RBC 5.37 4.22 - 5.81 MIL/uL   Hemoglobin 15.9 13.0 - 17.0 g/dL   HCT 50.4 39.0 - 52.0 %   MCV 93.9 80.0 - 100.0 fL   MCH 29.6 26.0 - 34.0 pg   MCHC 31.5 30.0 - 36.0 g/dL   RDW 13.6 11.5 - 15.5 %   Platelets 255 150 - 400 K/uL   nRBC 0.0 0.0 - 0.2 %    Comment: Performed at [facility], [address] [place], [place] [address]  Basic metabolic panel     Status: Abnormal   Collection Time: 12/25/17  6:08 AM  Result Value Ref Range   Sodium 152 (H) 135 - 145 mmol/L   Potassium 3.7 3.5 - 5.1 mmol/L   Chloride 116 (H) 98 - 111 mmol/L   CO2 27 22 - 32 mmol/L   Glucose, Bld 114 (H) 70 - 99 mg/dL   BUN 31 (H) 6 - 20 mg/dL   Creatinine, Ser 1.00 0.61 - 1.24 mg/dL   Calcium 8.6 (L) 8.9 - 10.3 mg/dL   GFR calc non Af Amer >60 >60 mL/min   GFR calc Af Amer >60 >60 mL/min    Comment: (NOTE) The eGFR has been calculated using the CKD EPI equation. This calculation has not been validated in all clinical situations. eGFR's persistently <60 mL/min signify possible Chronic Kidney Disease.    Anion gap 9 5 - 15    Comment: Performed at [facility], [address] [place], [place] [address]  Glucose, capillary     Status: Abnormal   Collection Time: 12/25/17  7:26 AM  Result Value Ref Range     Glucose-Capillary 106 (H) 70 - 99 mg/dL  Glucose, capillary     Status: Abnormal   Collection Time: 12/25/17 11:51 AM  Result Value Ref Range   Glucose-Capillary 152 (H) 70 - 99 mg/dL  Glucose, capillary     Status: Abnormal   Collection Time: 12/25/17  4:56 PM  Result Value Ref Range   Glucose-Capillary 131 (H) 70 - 99 mg/dL    Current  Facility-Administered Medications  Medication Dose Route Frequency Provider Last Rate Last Dose  . acetaminophen (TYLENOL) tablet 650 mg  650 mg Oral Q6H PRN Sainani, Vivek J, MD       Or  . acetaminophen (TYLENOL) suppository 650 mg  650 mg Rectal Q6H PRN Sainani, Vivek J, MD      . amLODipine (NORVASC) tablet 10 mg  10 mg Oral Daily Sainani, Vivek J, MD   10 mg at 12/21/17 0808  . Ampicillin-Sulbactam (UNASYN) 3 g in sodium chloride 0.9 % 100 mL IVPB  3 g Intravenous Q6H Kalisetti, Radhika, MD 200 mL/hr at 12/25/17 1650 3 g at 12/25/17 1650  . aspirin suppository 300 mg  300 mg Rectal Daily Kalisetti, Radhika, MD   300 mg at 12/25/17 1415  . atorvastatin (LIPITOR) tablet 20 mg  20 mg Oral q1800 Sainani, Vivek J, MD      . bisacodyl (DULCOLAX) suppository 10 mg  10 mg Rectal Daily PRN Kalisetti, Radhika, MD      . budesonide (PULMICORT) nebulizer solution 0.5 mg  0.5 mg Nebulization BID Sainani, Vivek J, MD   0.5 mg at 12/25/17 0805  . chlorproMAZINE (THORAZINE) 25 mg in sodium chloride 0.9 % 25 mL IVPB  25 mg Intravenous Q8H PRN Kalisetti, Radhika, MD   Stopped at 12/23/17 1315  . [START ON 12/19/2017] cloNIDine (CATAPRES - Dosed in mg/24 hr) patch 0.3 mg  0.3 mg Transdermal Weekly Kalisetti, Radhika, MD      . dextrose 5 %-0.45 % sodium chloride infusion   Intravenous Continuous Kalisetti, Radhika, MD 75 mL/hr at 12/25/17 1448    . enoxaparin (LOVENOX) injection 40 mg  40 mg Subcutaneous Q24H Sainani, Vivek J, MD   40 mg at 12/25/17 1425  . glycopyrrolate (ROBINUL) injection 0.2 mg  0.2 mg Intravenous QID Larach, Mary W, NP   0.2 mg at 12/25/17 1449  . haloperidol lactate (HALDOL) injection 1-2 mg  1-2 mg Intravenous Q6H PRN Kalisetti, Radhika, MD   2 mg at 12/24/17 1607  . hydrALAZINE (APRESOLINE) injection 10 mg  10 mg Intravenous Q6H PRN Kalisetti, Radhika, MD   10 mg at 12/24/17 0941  . hydrALAZINE (APRESOLINE) injection 10 mg  10 mg Intravenous QID Kalisetti, Radhika, MD      . insulin  aspart (novoLOG) injection 0-20 Units  0-20 Units Subcutaneous TID WC Sainani, Vivek J, MD   4 Units at 12/25/17 1333  . insulin aspart (novoLOG) injection 0-5 Units  0-5 Units Subcutaneous QHS Sainani, Vivek J, MD      . ipratropium-albuterol (DUONEB) 0.5-2.5 (3) MG/3ML nebulizer solution 3 mL  3 mL Nebulization BID Kalisetti, Radhika, MD   3 mL at 12/25/17 0805  . labetalol (NORMODYNE,TRANDATE) injection 10 mg  10 mg Intravenous Q4H PRN Kalisetti, Radhika, MD   10 mg at 12/23/17 1835  . MEDLINE mouth rinse  15 mL Mouth Rinse BID Sainani, Vivek J, MD   15 mL at 12/25/17 1507  . metoprolol tartrate (LOPRESSOR) injection 5 mg  5 mg Intravenous Q6H Kalisetti, Radhika, MD   5 mg at   12/25/17 1445  . mirtazapine (REMERON SOL-TAB) disintegrating tablet 15 mg  15 mg Oral QHS Clapacs, John T, MD      . morphine 2 MG/ML injection 2 mg  2 mg Intravenous Q4H PRN Kalisetti, Radhika, MD   2 mg at 12/25/17 0342  . nitroGLYCERIN (NITRODUR - Dosed in mg/24 hr) patch 0.3 mg  0.3 mg Transdermal Daily Kalisetti, Radhika, MD   0.3 mg at 12/25/17 1428  . ondansetron (ZOFRAN) tablet 4 mg  4 mg Oral Q6H PRN Sainani, Vivek J, MD       Or  . ondansetron (ZOFRAN) injection 4 mg  4 mg Intravenous Q6H PRN Sainani, Vivek J, MD      . scopolamine (TRANSDERM-SCOP) 1 MG/3DAYS 1.5 mg  1 patch Transdermal Q72H Kalisetti, Radhika, MD   1.5 mg at 12/23/17 1410    Musculoskeletal: Strength & Muscle Tone: decreased Gait & Station: unable to stand Patient leans: N/A  Psychiatric Specialty Exam: Physical Exam  Nursing note and vitals reviewed. Constitutional: He appears well-developed and well-nourished.  HENT:  Head: Normocephalic and atraumatic.  Eyes: Pupils are equal, round, and reactive to light. Conjunctivae are normal.  Neck: Normal range of motion.  Cardiovascular: Regular rhythm and normal heart sounds.  Respiratory: He is in respiratory distress. He has wheezes.  GI: Soft.  Musculoskeletal: Normal range of motion.   Skin: Skin is warm and dry.  Psychiatric: His affect is blunt. He is noncommunicative.    Review of Systems  Musculoskeletal: Negative for back pain, falls, joint pain and neck pain.  Psychiatric/Behavioral: Negative for hallucinations and suicidal ideas.    Blood pressure (!) 190/124, pulse (!) 102, temperature 98.7 F (37.1 C), temperature source Oral, resp. rate 18, height 5' 11" (1.803 m), weight 110.4 kg, SpO2 96 %.Body mass index is 33.93 kg/m.  General Appearance: Fairly Groomed  Eye Contact:  Minimal  Speech:  Garbled and Slurred  Volume:  Decreased  Mood:  Euthymic  Affect:  Flat  Thought Process:  NA  Orientation:  Negative  Thought Content:  NA  Suicidal Thoughts:  No  Homicidal Thoughts:  No  Memory:  Immediate;   Fair Recent;   Poor Remote;   Poor  Judgement:  Negative  Insight:  Negative  Psychomotor Activity:  Negative  Concentration:  Concentration: Negative  Recall:  Negative  Fund of Knowledge:  Negative  Language:  Negative  Akathisia:  Negative  Handed:  Right  AIMS (if indicated):     Assets:  Resilience Social Support  ADL's:  Impaired  Cognition:  Impaired,  Severe  Sleep:        Treatment Plan Summary: Daily contact with patient to assess and evaluate symptoms and progress in treatment, Medication management and Plan Patient was awake and appeared to have some alertness but could not communicate verbally very well.  Denied however feeling suicidal or having psychotic symptoms.  I agree with the Haldol as needed as being the best medicine if the patient were to become agitated by delirium.  In the past the best medications for helping him with anxiety and mood were antidepressants.  Given that he cannot swallow pills right now a reasonable medication would be mirtazapine oral dissolving tablets if these can be given in the mouth and allowed to dissolve he should be able to manage them as long as he is not on an NG tube.  Once he gets a PEG placed we  may have more options about medications.  Orders completed.    I will follow-up as needed.  Disposition: Supportive therapy provided about ongoing stressors.  John Clapacs, MD 12/25/2017 5:32 PM 

## 2017-12-25 NOTE — Consult Note (Signed)
Consultation Note Date: 12/25/2017   Patient Name: [name] [name].  DOB: 12/14/1960  MRN: [id number]  Age / Sex: 57 y.o., male  PCP: Revelo, Presley Raddle, MD Referring Physician: Enid Baas, MD  Reason for Consultation: Establishing goals of care and Psychosocial/spiritual support  HPI/Patient Profile: 57 y.o. male   admitted on 12/14/2017 with past medical history of previous CVA, aortic stenosis, bipolar disorder, schizophrenia/per family,  diabetes, GERD,  polysubstance abuse/ clean for two years per family who presents to the hospital from home due to weakness, difficulty ambulating/with walker.   MRI showed advanced small vessel disease, acute left lateral medullary infarct, advanced chronic left PICA territory ischemia. Chronic infarcts also in the [name] MCA and PCA territory, bilateral cerebral white matter.  Family face treatment option decisions, advanced directive decisions, and anticipatory care needs.   Clinical Assessment and Goals of Care:   This NP Lorinda Creed reviewed medical records, received report from team, assessed the patient and then meet at the patient's bedside along with his siter, niece/[name] [name] and daughter [name] [name] to discuss diagnosis, prognosis, GOC, EOL wishes disposition and options.  Concept of Hospice and Palliative Care were discussed.  A detailed discussion was had today regarding advanced directives.  Concepts specific to code status, artifical feeding and hydration, continued IV antibiotics and rehospitalization was had.  The difference between a aggressive medical intervention path  and a palliative comfort care path for this patient at this time was had.  Values and goals of care important to patient and family were attempted to be elicited.  We discussed the seriousness of the current medical situation and the high risk for decompensation regardless of life  prolonging measures.  We discussed the risks and benefits of artificial feeding/PEG tube.  MOST form introduced and Hard Choices booklet left for review  Natural trajectory and expectations at EOL were discussed.  Questions and concerns addressed.   Family encouraged to call with questions or concerns.    PMT will continue to support holistically.   ** There is no designated healthcare power of attorney documented.  Although the  patient has one daughter/[name] [name] she defers all decisions to the patient's sister/[name] [name] is his main support person and Management consultant.   Family works together for the best interest of the patient. **  According to Arvid [name],  [name] [name] has always had mental health issues that required him to be supported by family.     SUMMARY OF RECOMMENDATIONS    Code Status/Advance Care Planning:  DNR- documented today.  Family understand that if patient improves it can be reversed at any time    Symptom Management:   Dysphagia: N.p.o. and assess for PEG placement  Secretions: Robinul 0.2 mg IV scheduled 3 times a day  Palliative Prophylaxis:   Aspiration, Bowel Regimen, Delirium Protocol, Frequent Pain Assessment and Oral Care  Additional Recommendations (Limitations, Scope, Preferences):  Full Scope Treatment  Psycho-social/Spiritual:   Desire for further Chaplaincy support:yes  Additional Recommendations: Emotional support offered.  Created space and opportunity for family to explore thoughts and feelings about current situation.  They understand the seriousness of the situation but remain hopeful for improvement.  Prognosis:   Unable to determine--risk for decompensation  Discharge Planning: Skilled Nursing Facility for rehab with Palliative care service follow-up      Primary Diagnoses: Present on Admission: . Difficulty walking   I have reviewed the medical record, interviewed the patient and family, and examined the  patient. The following aspects are pertinent.  Past Medical History:  Diagnosis Date  . Aortic stenosis, mild February 2015  . Auditory hallucinations   . Bipolar disorder (HCC)   . Coronary artery disease    No reported catheterization. Negative Myoview stress test and 2013.  Marland Kitchen Depression   . Diabetes mellitus   . GERD (gastroesophageal reflux disease)   . History of suicide attempt   . Hx of medication noncompliance   . Hypertension   . Hypertensive hypertrophic cardiomyopathy [facility]) February 2015   Severe concentric LVH on echocardiogram.  . Obesity, Class II, BMI 35-39.9, with comorbidity   . Pericarditis   . Polysubstance abuse (HCC)    cocaine, marijuana  . Stroke [facility]) February 2015   [name] MCA aneurysm; head MRI - acute infarct of the [name] frontal lobe. Smaller acute infarct of the left frontal lobe. Thought to be due to cerebral emboli. Left PICA infarct. Small chronic lacunar infarct [name] internal capsule. Chronic microvascular ischemic change with white matter change.  . Syncope and collapse    Social History   Socioeconomic History  . Marital status: Single    Spouse name: Not on file  . Number of children: Not on file  . Years of education: Not on file  . Highest education level: Not on file  Occupational History  . Not on file  Social Needs  . Financial resource strain: Not on file  . Food insecurity:    Worry: Not on file    Inability: Not on file  . Transportation needs:    Medical: Not on file    Non-medical: Not on file  Tobacco Use  . Smoking status: Current Every Day Smoker    Packs/day: 1.00    Years: 40.00    Pack years: 40.00    Types: Cigars, Cigarettes  . Smokeless tobacco: Never Used  Substance and Sexual Activity  . Alcohol use: No    Alcohol/week: 43.0 standard drinks    Types: 3 Glasses of wine, 40 Cans of beer per week    Comment: occasionally; reports 1 pint of vodka per month at paydaysocially  . Drug use: Yes    Frequency: 1.0  times per week    Types: Marijuana, Cocaine    Comment: past use; denies current; UDS positive for cocaine  . Sexual activity: Yes  Lifestyle  . Physical activity:    Days per week: Not on file    Minutes per session: Not on file  . Stress: Not on file  Relationships  . Social connections:    Talks on phone: Not on file    Gets together: Not on file    Attends religious service: Not on file    Active member of club or organization: Not on file    Attends meetings of clubs or organizations: Not on file    Relationship status: Not on file  Other Topics Concern  . Not on file  Social History Narrative   Single with no children.  No exercise.   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   Has a history of polysubstance abuse including cocaine. No she she urine toxicology screen in the hospital was positive.  He also current smoker.   He is seen by behavioral health for bipolar disorder.   Family History  Problem Relation Age of Onset  . Stroke Mother   . Diabetes type II Mother   . Hypertension Mother   . Hypertension Father   . Arthritis Father   . Arthritis Sister   . Diabetes type II Sister   . Hypertension Sister    Scheduled Meds: . amLODipine  10 mg Oral Daily  . aspirin  300 mg Rectal Daily  . atorvastatin  20 mg Oral q1800  . budesonide (PULMICORT) nebulizer solution  0.5 mg Nebulization BID  . [START ON July 07, 2017] cloNIDine  0.3 mg Transdermal Weekly  . enoxaparin (LOVENOX) injection  40 mg Subcutaneous Q24H  . insulin aspart  0-20 Units Subcutaneous TID WC  . insulin aspart  0-5 Units Subcutaneous QHS  . ipratropium-albuterol  3 mL Nebulization BID  . mouth rinse  15 mL Mouth Rinse BID  . metoprolol tartrate  5 mg Intravenous Q6H  . nitroGLYCERIN  0.3 mg Transdermal Daily  . scopolamine  1 patch Transdermal Q72H   Continuous Infusions: . ampicillin-sulbactam (UNASYN) IV Stopped (12/25/17 0243)  . chlorproMAZINE  (THORAZINE) IV Stopped (12/23/17 1315)  . dextrose 5 % and 0.45% NaCl     PRN Meds:.acetaminophen **OR** acetaminophen, chlorproMAZINE (THORAZINE) IV, glycopyrrolate, haloperidol lactate, hydrALAZINE, labetalol, morphine injection, ondansetron **OR** ondansetron (ZOFRAN) IV Medications Prior to Admission:  Prior to Admission medications   Medication Sig Start Date End Date Taking? Authorizing Provider  amLODipine (NORVASC) 10 MG tablet Take 1 tablet (10 mg total) by mouth daily. 04/17/16   Jimmy FootmanHernandez-Gonzalez, Andrea, MD  aspirin 81 MG EC tablet Take 1 tablet (81 mg total) by mouth daily. 04/17/16   Jimmy FootmanHernandez-Gonzalez, Andrea, MD  atorvastatin (LIPITOR) 20 MG tablet Take 1 tablet (20 mg total) by mouth daily at 6 PM. 04/17/16   Jimmy FootmanHernandez-Gonzalez, Andrea, MD  busPIRone (BUSPAR) 10 MG tablet Take by mouth. 05/29/13   [provider]  butalbital-acetaminophen-caffeine Marikay Alar(FIORICET, ESGIC) 16-109-6050-325-40 MG tablet Take 1-2 tablets by mouth every 6 (six) hours as needed for headache. 04/21/17 04/21/18  Jene EveryKinner, Robert, MD  chlorthalidone (HYGROTON) 25 MG tablet Take 1 tablet (25 mg total) by mouth daily. 04/17/16   Jimmy FootmanHernandez-Gonzalez, Andrea, MD  cloNIDine (CATAPRES) 0.3 MG tablet Take by mouth.    [provider]  clopidogrel (PLAVIX) 75 MG tablet Take 1 tablet (75 mg total) by mouth daily. Patient not taking: Reported on 03/31/2017 04/17/16   Jimmy FootmanHernandez-Gonzalez, Andrea, MD  cyclobenzaprine (FLEXERIL) 10 MG tablet Take 1 tablet (10 mg total) by mouth 3 (three) times daily as needed. 09/13/17   Joni ReiningSmith, Ronald K, PA-C  Diclofenac Sodium 3 % GEL Place onto the skin. 05/06/16   [provider]  doxepin (SINEQUAN) 10 MG capsule Take 10 mg by mouth daily.    [provider]  FLUARIX QUADRIVALENT 0.5 ML injection inject 0.5 milliliter intramuscularly 10/30/16   [provider]  ibuprofen (ADVIL,MOTRIN) 600 MG tablet Take 1 tablet (600 mg total) by mouth every 8 (eight) hours as needed.  09/13/17   Joni ReiningSmith, Ronald K, PA-C  insulin aspart (NOVOLOG) 100 UNIT/ML injection Inject 0-9 Units into the skin 3 (three) times daily with meals. 11/15/16   Katha HammingKonidena, Snehalatha, MD  insulin aspart (NOVOLOG)  100 UNIT/ML injection Inject 0-5 Units into the skin at bedtime. 11/15/16   Katha Hamming, MD  Insulin Glargine (LANTUS SOLOSTAR) 100 UNIT/ML Solostar Pen Inject into the skin.    [provider]  insulin regular (NOVOLIN R,HUMULIN R) 100 units/mL injection Inject into the skin.    [provider]  lisinopril (PRINIVIL,ZESTRIL) 40 MG tablet Take by mouth.    [provider]  metFORMIN (GLUCOPHAGE) 500 MG tablet Take one tablet by mouth twice a day 05/29/13   [provider]  metoprolol tartrate (LOPRESSOR) 25 MG tablet Take 1 tablet (25 mg total) by mouth 2 (two) times daily. 11/15/16   Katha Hamming, MD  naproxen (NAPROSYN) 500 MG tablet  10/30/16   [provider]  nitroGLYCERIN (NITROSTAT) 0.4 MG SL tablet Place 1 tablet (0.4 mg total) under the tongue every 5 (five) minutes as needed for chest pain. 04/17/16   Jimmy Footman, MD  oxyCODONE-acetaminophen (PERCOCET/ROXICET) 5-325 MG tablet take 1 tablet by mouth every 6 hours if needed for severe pain 09/24/16   [provider]  pantoprazole (PROTONIX) 40 MG tablet Take 1 tablet (40 mg total) by mouth 2 (two) times daily before a meal. Patient not taking: Reported on 03/31/2017 11/15/16   Katha Hamming, MD  sertraline (ZOLOFT) 50 MG tablet Take by mouth. 04/17/16   [provider]  thiamine 100 MG tablet Take 1 tablet (100 mg total) by mouth daily. 11/15/16   Katha Hamming, MD  traMADol (ULTRAM) 50 MG tablet Take 1 tablet (50 mg total) by mouth every 12 (twelve) hours as needed. 09/13/17   Joni Reining, PA-C  traZODone (DESYREL) 150 MG tablet Take 1 tablet (150 mg total) by mouth at bedtime. 04/17/16   Jimmy Footman, MD   No Known  Allergies Review of Systems  Unable to perform ROS: Acuity of condition    Physical Exam  Constitutional: He appears well-developed. He appears lethargic. He appears ill. Nasal cannula in place.  HENT:  -Audible throat secretions, unable to clear on his own  Cardiovascular: Normal rate, regular rhythm and normal heart sounds.  Neurological: He appears lethargic.  -Patient maintains eye contact and is able to follow simple commands  Patient is able to communicate with yes and no nods  Skin: Skin is warm and dry.    Vital Signs: BP (!) 141/104   Pulse 69   Temp 98.3 F (36.8 C) (Oral)   Resp 16   Ht 5\' 11"  (1.803 m)   Wt 110.4 kg   SpO2 96%   BMI 33.93 kg/m  Pain Scale: PAINAD POSS *See Group Information*: 1-Acceptable,Awake and alert Pain Score: Asleep   SpO2: SpO2: 96 % O2 Device:SpO2: 96 % O2 Flow Rate: .O2 Flow Rate (L/min): 2 L/min  IO: Intake/output summary:   Intake/Output Summary (Last 24 hours) at 12/25/2017 1610 Last data filed at 12/25/2017 9604 Gross per 24 hour  Intake 2607.39 ml  Output 3150 ml  Net -542.61 ml    LBM: Last BM Date: 12/21/17 Baseline Weight: Weight: 104.3 kg Most recent weight: Weight: 110.4 kg     Palliative Assessment/Data: 20 %     Discussed with Dr Nemiah Commander    Time In: 0830 Time Out: 1000 Time Total: 90 minutes Greater than 50%  of this time was spent counseling and coordinating care related to the above assessment and plan.  Signed by: Lorinda Creed, NP   Please contact Palliative Medicine Team phone at [phone] for questions and concerns.  For individual  provider: See Shea Evans

## 2017-12-25 NOTE — Clinical Social Work Note (Signed)
After meeting with palliative today family has decided to pursue SNF and atrificial feedings. CSW has contacted Tresa EndoKelly at [facility] who states that they should be able to accept patient when medically ready. CSW will continue to follow for discharge planning.   Ruthe Mannanandace Valeria Boza MSW, [address]CSWA [phone]

## 2017-12-25 NOTE — Progress Notes (Signed)
SLP Cancellation Note  Patient Details Name: [name] LimLinnies Lascano Jr. MRN: [id number] DOB: January 18, 1961   Cancelled treatment:       Reason Eval/Treat Not Completed: Medical issues which prohibited therapy;Patient not medically ready(chart reviewed; pt remains at high risk for aspiration). Pt remains drowsy but does awaken to verbal stim. Noted min decreased laryngeal-pharyngeal secretions but continued velopharyngeal incompetency w/ heavy snoring and apnea. He is severely Dysarthric and has difficulty swallowing. Per chart notes, family has decided to pursue SNF and PEG placement for alternative means of nutrition post a meeting w/ Palliative Care.  Due to need for pending PEG surgery, ST services will hold on any po trials (ice chips) in hopes of engaging the pharyngeal swallowing. Recommend continued (frequent) oral care for hygiene and oral stimulation of swallowing. Post PEG placement, recommend Dysphagia tx to stimulate and "practice" the pharyngeal swallow in hopes to recover function for a future oral diet of least restrictive consistency. NSG updated.    Jerilynn Som , MS, CCC-SLP , 12/25/2017, 2:47 PM

## 2017-12-25 NOTE — Progress Notes (Signed)
OT Cancellation Note  Patient Details Name: [name] LimLinnies Mittelman Jr. MRN: [id number] DOB: 1960/12/04   Cancelled Treatment:    Reason Eval/Treat Not Completed: Patient at procedure or test/ unavailable. Pt out of room, taken for ultrasound per RN. Will re-attempt OT tx at later date/time as pt is available and medically appropriate.   Richrd PrimeJamie Stiller, MPH, MS, OTR/L ascom [phone] 12/25/17, 2:57 PM

## 2017-12-25 NOTE — Progress Notes (Addendum)
[employer] [facility] [facility] [place] [facility]   PATIENT NAME: [name] [name]    MR#:  [id number]  DATE OF BIRTH:  1960-11-16  SUBJECTIVE:  CHIEF COMPLAINT:   Chief Complaint  Patient presents with  . Weakness   -Drowsy, arousable and following simple commands. -Continues to pool secretions back of the mouth  REVIEW OF SYSTEMS:  Review of Systems  Unable to perform ROS: Mental status change    DRUG ALLERGIES:  No Known Allergies  VITALS:  Blood pressure (!) 175/140, pulse 95, temperature 98.3 F (36.8 C), temperature source Oral, resp. rate 18, height 5\' 11"  (1.803 m), weight 110.4 kg, SpO2 92 %.  PHYSICAL EXAMINATION:  Physical Exam   GENERAL:  57 y.o.-year-old obese, patient lying in the bed with no acute distress.  ill appearing EYES: Pupils equal, round, reactive to light and accommodation. No scleral icterus. Extraocular muscles intact.  HEENT: Head atraumatic, normocephalic. Oropharynx and nasopharynx clear.  NECK:  Supple, no jugular venous distention. No thyroid enlargement, no tenderness.  LUNGS: gurgling, rhonchorous upper airway secretions noted.  no wheezing, rales,or crepitation. No use of accessory muscles of respiration.  Decreased bibasilar breath sounds Occasional periods of apnea noted CARDIOVASCULAR: S1, S2 normal. No , rubs, or gallops.  Loud 3/6 systolic murmur is present ABDOMEN: Soft, nontender, nondistended. Bowel sounds present. No organomegaly or mass.  EXTREMITIES: No pedal edema, cyanosis, or clubbing.  NEUROLOGIC: minimal left facial droop, left sided hemi neglect seen. Slightly weaker on the right side with drift noted.  Left-sided hemineglect noted.  Unable to protect airway with gurgling secretions. No gag reflex or cough reflex PSYCHIATRIC: The patient is alert and following simple commands.  SKIN: No obvious rash, lesion, or ulcer.    LABORATORY PANEL:   CBC Recent Labs  Lab 12/25/17 0608  WBC 13.0*  HGB 15.9  HCT 50.4    PLT 255   ------------------------------------------------------------------------------------------------------------------  Chemistries  Recent Labs  Lab 12/25/17 0608  NA 152*  K 3.7  CL 116*  CO2 27  GLUCOSE 114*  BUN 31*  CREATININE 1.00  CALCIUM 8.6*   ------------------------------------------------------------------------------------------------------------------  Cardiac Enzymes Recent Labs  Lab 12/26/2017 2214  TROPONINI 0.04*   ------------------------------------------------------------------------------------------------------------------  RADIOLOGY:  No results found.  EKG:   Orders placed or performed during the hospital encounter of Dec 26, 2017  . EKG 12-Lead  . EKG 12-Lead  . ED EKG  . ED EKG  . EKG 12-Lead  . EKG 12-Lead    ASSESSMENT AND PLAN:   57 year old male with past medical history of bipolar disorder, previous CVA, diabetes, hypertension, hyperlipidemia, polysubstance abuse, medical noncompliance who presents to the hospital due to generalized weakness, difficulty ambulating.  1.  Acute stroke-MRI showing acute left medullary CVA, has slight right-sided weakness.  -Also right-sided weakness.  Pharyngeal weakness and unable to swallow oral secretions.  Pooling of secretions noted requiring chest physical therapy and frequent suctioning. -Also hiccups and apneic period's-brainstem stroke -Appreciate neurology consult.  Patient on rectal aspirin only -Echocardiogram showing no thrombus. 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 -Unable to take oral medications.  NG tube attempted but patient pulled out. -Aspirin suppository for now -PT and OT  -    -will need SNF at discharge  2.  Aspiration pneumonitis-dysphagia from stroke.  Has had aspiration  episodes twice since admission -Remains  n.p.o.  Appreciate speech therapy input -Continue Unasyn stop after 7 days.  Elevated WBC -NG tube attempted but patient pulled out -Now pooling secretions, dysphagia of oropharyngeal phase-will need PEG tube for nutrition. -Continue suctioning, scopolamine patch added  3.  Altered mental status-acute metabolic encephalopathy-from stroke and infection - concern for PRES from elevated BP- will work on getting BP controlled -On antibiotics -Repeat CT head with no acute findings -Improving mental status, patient is alert and following simple commands.  4.  Diabetes-sliding scale insulin.  Has remained n.p.o.  5.  Hypertension-blood pressure is elevated as unable to take oral medications.  \ -Increased clonidine patch to 0.3 mg, added Nitropatch -Scheduled IV metoprolol and IV hydralazine today -Continue IV hydralazine and IV labetalol.   6.  Acute urinary retention-noted in the emergency room.  Foley catheter in place. -We will start Flomax if able to take oral medicines  7.  DVT prophylaxis-Lovenox  8.  Dysphagia-secondary to stroke.  GI consulted for PEG tube placement, however due to acute stroke and elevated pressures concern for drop in pressure and worsening of stroke under anesthesia. -IR consulted for percutaneous gastrostomy placement  9. Hypernatremia- fluids changed to D5 1/2NS   Appreciate palliative care consult. -Patient is a DNR now.  Family wants to pursue PEG placement and rehab placement   All the records are reviewed and case discussed with Care Management/Social Workerr. Management plans discussed with the patient, family and they are in agreement.  CODE STATUS: DNR  TOTAL TIME TAKING CARE OF THIS PATIENT: 39 minutes.   POSSIBLE D/C IN 4-5 DAYS, DEPENDING ON CLINICAL CONDITION.   Enid BaasKALISETTI,Laquandra Carrillo M.D on 12/25/2017 at 2:46 PM  Between 7am to 6pm - Pager - [phone]  After 6pm go to [email or web address] - Geophysicist/field [email or web address]  [facility]  [facility]  Office  [phone]  CC: Primary care physician; Preston Fleetingevelo, Adrian Mancheno, MD

## 2017-12-26 ENCOUNTER — Inpatient Hospital Stay: Payer: Medicaid Other

## 2017-12-26 ENCOUNTER — Other Ambulatory Visit: Payer: Self-pay

## 2017-12-26 ENCOUNTER — Other Ambulatory Visit: Payer: Self-pay | Admitting: *Deleted

## 2017-12-26 DIAGNOSIS — Z7189 Other specified counseling: Secondary | ICD-10-CM

## 2017-12-26 LAB — BLOOD GAS, ARTERIAL
Acid-base deficit: 1.7 mmol/L (ref 0.0–2.0)
Bicarbonate: 24.3 mmol/L (ref 20.0–28.0)
FIO2: 0.36
O2 Saturation: 93.5 %
PATIENT TEMPERATURE: 37
PCO2 ART: 45 mmHg (ref 32.0–48.0)
PO2 ART: 65 mmHg — AB (ref 83.0–108.0)
pH, Arterial: 7.34 — ABNORMAL LOW (ref 7.350–7.450)

## 2017-12-26 LAB — BASIC METABOLIC PANEL
ANION GAP: 8 (ref 5–15)
BUN: 41 mg/dL — ABNORMAL HIGH (ref 6–20)
CO2: 26 mmol/L (ref 22–32)
Calcium: 8.5 mg/dL — ABNORMAL LOW (ref 8.9–10.3)
Chloride: 118 mmol/L — ABNORMAL HIGH (ref 98–111)
Creatinine, Ser: 1.18 mg/dL (ref 0.61–1.24)
GFR calc non Af Amer: >60 mL/min (ref >60)
Glucose, Bld: 174 mg/dL — ABNORMAL HIGH (ref 70–99)
POTASSIUM: 4 mmol/L (ref 3.5–5.1)
Sodium: 152 mmol/L — ABNORMAL HIGH (ref 135–145)

## 2017-12-26 LAB — GLUCOSE, CAPILLARY
GLUCOSE-CAPILLARY: 183 mg/dL — AB (ref 70–99)
GLUCOSE-CAPILLARY: 177 mg/dL — AB (ref 70–99)
Glucose-Capillary: 157 mg/dL — ABNORMAL HIGH (ref 70–99)
Glucose-Capillary: 170 mg/dL — ABNORMAL HIGH (ref 70–99)
Glucose-Capillary: 178 mg/dL — ABNORMAL HIGH (ref 70–99)

## 2017-12-26 LAB — PROTIME-INR
INR: 1.11
PROTHROMBIN TIME: 14.2 s (ref 11.4–15.2)

## 2017-12-26 MED ORDER — GLYCOPYRROLATE 0.2 MG/ML IJ SOLN
0.6000 mg | Freq: Four times a day (QID) | INTRAMUSCULAR | Status: DC
  Administered 2017-12-27: 0.6 mg via INTRAVENOUS
  Filled 2017-12-26 (×4): qty 3

## 2017-12-26 MED ORDER — GLYCOPYRROLATE 0.2 MG/ML IJ SOLN
0.2000 mg | Freq: Once | INTRAMUSCULAR | Status: AC
  Administered 2017-12-26: 0.2 mg via INTRAVENOUS
  Filled 2017-12-26: qty 1

## 2017-12-26 MED ORDER — GLYCOPYRROLATE 0.2 MG/ML IJ SOLN
0.2000 mg | Freq: Four times a day (QID) | INTRAMUSCULAR | Status: DC
  Administered 2017-12-26 (×3): 0.2 mg via INTRAVENOUS
  Filled 2017-12-26 (×4): qty 1

## 2017-12-26 NOTE — Progress Notes (Addendum)
OT Cancellation Note  Patient Details Name: [name] LimLinnies Dottavio Jr. MRN: [id number] DOB: 01-25-1961   Cancelled Treatment:    Reason Eval/Treat Not Completed: Medical issues which prohibited therapy(Pt. had a Rapid Response intervention this a.m. Nursing reports pt. Is unresponsive. Will continue to monitor, and intervene at a later time/date when appropriate.)  Olegario MessierElaine Rukia Mcgillivray, MS, OTR/L 12/26/2017, 10:07 AM

## 2017-12-26 NOTE — Progress Notes (Signed)
Spoke with Dr Mack HookKallisetti regarding patient's declining status. States there is still discussion of plan for Mr [name] [name]. Due to clinical status plan to hold off on G tube placement today, monitor over weekend and reevaluate.I relayed this discussion with Dr Lowella DandyHenn IR today.

## 2017-12-26 NOTE — Progress Notes (Signed)
Pharmacy Antibiotic Note  [name] Michaell Cowingride Jr. is a 57 y.o. male admitted on 12/17/2017 with pneumonia.  Pharmacy has been consulted for ampicillin-sulbactam dosing.  Patient's initial urinalysis was suspected to be positive for UTI and he had significant urinary retention and had a Foley catheter placed with 1200 cc of urine drained.  Given his multiple issues hospitalist services were contacted for admission. Concern for aspiration pneumonitis. Patient tried to eat a sandwich and aspirated.   Plan: Will start amp/sulb 3 g q6H per patient's CrCl. Total of 7 day ordered. Will be completed after tonight dose.   Height: 5\' 11"  (180.3 cm) Weight: 243 lb 4.8 oz (110.4 kg) IBW/kg (Calculated) : 75.3  Temp (24hrs), Avg:99.5 F (37.5 C), Min:98.3 F (36.8 C), Max:100.6 F (38.1 C)  Recent Labs  Lab 12/09/2017 1038 12/21/17 0245 12/22/17 0520 12/25/17 0608 12/26/17 0403  WBC 10.7* 17.6* 18.0* 13.0*  --   CREATININE 1.16 1.35* 1.10 1.00 1.18    Estimated Creatinine Clearance: 87.2 mL/min (by C-G formula based on SCr of 1.18 mg/dL).    No Known Allergies  Antimicrobials this admission: 11/16 ceftriaxone >> 11/16 x 1 11/16 ampicillin/sulbactam >> 11/22  Dose adjustments this admission:   Microbiology results: None.   Thank you for allowing pharmacy to be a part of this patient's care.  Ronnald RampKishan S Anderson [place], PharmD Clinical Pharmacist  12/26/2017 3:39 PM

## 2017-12-26 NOTE — Clinical Social Work Note (Signed)
CSW spoke with Tresa EndoKelly at [facility]. Per Tresa EndoKelly, they will be able to accept patient when medically ready. Family is aware. CSW will continue to follow for discharge planning.   [name] [name] MSW, [address]CS[address] [phone]

## 2017-12-26 NOTE — Progress Notes (Signed)
Daily Progress Note   Patient Name: [name] [name].       Date: 12/26/2017 DOB: Oct 27, 1960  Age: 57 y.o. MRN#: [id number] Attending Physician: [name] Lighter, MD Primary Care Physician: [name] Burrow, MD Admit Date: 12/14/2017  Reason for Consultation/Follow-up: Establishing goals of care, Hospice Evaluation and Psychosocial/spiritual support  Subjective: Patient does not respond to me, met with family - sister, daughter, and son  Length of Stay: 6  Current Medications: Scheduled Meds:  . amLODipine  10 mg Oral Daily  . aspirin  300 mg Rectal Daily  . atorvastatin  20 mg Oral q1800  . budesonide (PULMICORT) nebulizer solution  0.5 mg Nebulization BID  . [START ON Dec 31, 2017] cloNIDine  0.3 mg Transdermal Weekly  . enoxaparin (LOVENOX) injection  40 mg Subcutaneous Q24H  . glycopyrrolate  0.2 mg Intravenous Q6H  . hydrALAZINE  10 mg Intravenous QID  . insulin aspart  0-20 Units Subcutaneous TID WC  . insulin aspart  0-5 Units Subcutaneous QHS  . ipratropium-albuterol  3 mL Nebulization BID  . mouth rinse  15 mL Mouth Rinse BID  . mirtazapine  15 mg Oral QHS  . nitroGLYCERIN  0.3 mg Transdermal Daily  . scopolamine  1 patch Transdermal Q72H    Continuous Infusions: . ampicillin-sulbactam (UNASYN) IV Stopped (12/26/17 1152)  . chlorproMAZINE (THORAZINE) IV Stopped (12/23/17 1315)    PRN Meds: acetaminophen **OR** acetaminophen, bisacodyl, chlorproMAZINE (THORAZINE) IV, haloperidol lactate, hydrALAZINE, labetalol, morphine injection, ondansetron **OR** ondansetron (ZOFRAN) IV  Physical Exam  Constitutional: He appears ill. No distress. Nasal cannula in place.  HENT:  Head: Normocephalic and atraumatic.  Cardiovascular: Regular rhythm. Tachycardia present.    Pulmonary/Chest: No accessory muscle usage. No respiratory distress. He has rhonchi.  Abdominal: Soft. Bowel sounds are normal.  Neurological: He is unresponsive.  Skin: Skin is warm and dry. He is not diaphoretic.  Psychiatric: Cognition and memory are impaired.            Vital Signs: BP (!) 151/105 (BP Location: Right Arm)   Pulse (!) 109   Temp (!) 100.6 F (38.1 C) (Oral)   Resp 16   Ht _0  (1.803 m)   Wt 110.4 kg   SpO2 (!) 88% Comment: increased to 4lpm  BMI 33.93 kg/m  SpO2: SpO2: (!) 88 %(increased to 4lpm) O2 Device: O2 Device: Nasal Cannula O2 Flow Rate: O2 Flow Rate (L/min): 3 L/min(increased to 4lpm)  Intake/output summary:   Intake/Output Summary (Last 24 hours) at 12/26/2017 1415 Last data filed at 12/26/2017 0730 Gross per 24 hour  Intake 300.51 ml  Output 2625 ml  Net -2324.49 ml   LBM: Last BM Date: 12/21/17 Baseline Weight: Weight: 104.3 kg Most recent weight: Weight: 110.4 kg       Palliative Assessment/Data: PPS 10% d/t PO intake      Patient Active Problem List   Diagnosis Date Noted  . Subacute delirium 12/25/2017  . Severe recurrent major depression without psychotic features ([place]) 12/25/2017  . Other dysphagia   . DNR (do not resuscitate)   . Palliative care by specialist   . Cerebrovascular accident (CVA) ([place])   . Difficulty walking 12/26/2017  . GI bleed 11/07/2016  .  Accelerated hypertension 06/07/2016  . GERD (gastroesophageal reflux disease) 06/07/2016  . Bipolar affective disorder ([place]) 06/07/2016  . Chest pain 06/07/2016  . Unspecified Depressive disorder 04/17/2016  . 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 Chronic diastolic CHF (congestive heart failure) ([place]) 10/02/2015  . Tobacco use disorder 11/07/2014  . Stimulant use disorder ([place]) crack cocaine 11/07/2014  . Alcohol use disorder, moderate, dependence ([place]) 11/07/2014  . CAD (coronary artery disease) 11/05/2014  . Cerebral  infarction ([place]) 10/19/2014  . LVH (left ventricular hypertrophy) 05/20/2013  . Obesity (BMI 30-39.9) 04/10/2013  . Aortic stenosis, mild 03/07/2013  . DM (diabetes mellitus), type 2, uncontrolled ([place]) 01/05/2013  . Essential hypertension, malignant 01/25/2009    Palliative Care Assessment & Plan   HPI: 57 y.o. male   admitted on 12/10/2017 with past medical history of previous CVA, aortic stenosis, bipolar disorder, schizophrenia/per family,  diabetes, GERD,  polysubstance abuse/ clean for two years per family who presents to the hospital from home due to weakness, difficulty ambulating/with walker. MRI showed advanced small vessel disease, acute left lateral medullary infarct,advanced chronic left PICA territory ischemia. Chronic infarcts also in the right MCA and PCA territory, bilateral cerebral white matter. PMT consulted for [name] [name].  Assessment: Follow up with patient's sister and patient's 2 children - son and daughter. Met in the room with Dr. Tressia Miners. Together with Dr. Tressia Miners, we discussed patient's diagnoses and poor prognosis. We discussed his EOL wishes. The difference between an aggressive medical intervention path and a palliative comfort care path for this patient at this time was had.  Values and goals of care important to patient and family were attempted to be elicited. We discussed the seriousness of the current medical situation and the high risk for decompensation regardless of life prolonging measures.   After thorough conversation, all 3 family members agreed that patient should remain DNR and would not want to be intubated. They agreed that the main focus should be the patient's comfort and dignity. They all also shared that patient would not want his life artificially prolonged. They understand that at this time the patient is not stable enough for PEG tube placement. They also understand that PEG tube does not eliminate risk of aspiration.    We discussed hospice care.  Family is not sure about hospice care because the patient had mentioned before that he would never want to go to hospice. We discussed the type of care provided through hospice and specialty end of life care. They are considering options but would like to wait until Monday if patient is still alive to decide about transition to hospice home.   Several hours later I was called back to the bedside because family had spoken to neurology and wanted to talk about a tracheostomy. We discussed this at length - we discussed that patient may do well with this or he may not - discussed many different potential scenarios. We discussed the type of care patient may require and likely need for placement in facility. I spoke about this with both the sister, Lelon Frohlich and the patient's daughter, Jonelle Sidle. Both Lelon Frohlich and Jonelle Sidle reiterated that they would not want patient resuscitated. Lelon Frohlich tells me that she will agree to whatever Jonelle Sidle wants and understands that the patient's children are his legal decision makers as there are no HCPOA documents. After we spoke Jonelle Sidle took the time to call other family members and her brother to discuss options. We spoke again later and she tells me, at this  time, she does not want to pursue intubation/tracheostomy. She tells me we can discuss this option again on Monday. I clearly explained to Jonelle Sidle that the patient would likely decline over the weekend and may not be alive Monday. She expressed understanding and again indicated that she does not want to move forward with intubation/tracheostomy at this time. I attempted to call the patient's sister back and update her on our conversation; however, she did not answer. I also explained to Jonelle Sidle to contact patient's RN if their wishes changed. I updated Dr. Tressia Miners and Margaretann Loveless, RN about this conversation.   Recommendations/Plan:  At this time, family has decided to not move patient to ICU and not intubate him - they understand that he will likely  decline, possibly pass away in the coming days  Family plans to meet with PMT again Monday 9 am  Goals of Care and Additional Recommendations:  Limitations on Scope of Treatment: No Tracheostomy at this time  Code Status:  DNR  Prognosis:   < 2 weeks if family continues to not want to pursue intubation/trach  Discharge Planning:  To Be Determined  Care plan was discussed with Dr. Tressia Miners, Dr. Doy Mince, patient's sister, son, and daughter  Thank you for allowing the Palliative Medicine Team to assist in the care of this patient.   Time In & Out: 0930-1030 1500-1530 1630-1730 Total Time 150 minutes Prolonged Time Billed  yes       Greater than 50%  of this time was spent counseling and coordinating care related to the above assessment and plan.  Juel Burrow, DNP, [facility] Palliative Medicine Team Team Phone # [phone]  Pager [phone]

## 2017-12-26 NOTE — Progress Notes (Signed)
WORK NOTE -------------------   Ms. Kingsley Planamara Duhart has been at [facility] on 12/26/17 visiting his Father Mr. Darrick MeigsLinnies Bergland who is admitted here and is critically ill at this time.   So, Please excuse him for the day.  Thank you.  Enid Baasadhika Sharlette Jansma, MD [facility] [address], [place], [place] [address] Ph: [phone]

## 2017-12-26 NOTE — Progress Notes (Signed)
Dr Nemiah CommanderKalisetti notified via text that patient's sister would like her to call to discuss plan for patient.

## 2017-12-26 NOTE — Progress Notes (Signed)
Rapid Response Event Note  Overview:  Arrive to room to find pt lying in bed. On [place] with RT, NT suctioning.     Initial Focused Assessment: Pt in bed with copious thick secretions, and not able to fully clear on own. 2 RT, Database administratorHouse Supervisor, primary RN all at bedside assisting with pt care. RRT arrived at bedside and found pt to have copious thick secretions. Currently being suctioned on arrival by NT suctioning by RT. PT sats at 100% on 3L [employer].   Interventions: RT continue to NT suction thick secretion. House Supervisor spoke with decision maker who will be coming into the hospital.  CXR ordered per Rapid Response order set.  Plan of Care (if not transferred): Primary nurse spoke with MD and they will continue to monitor.  Event Summary:  12/26/17 at 0615     Carlene Coriahelsey N Calypso Hagarty

## 2017-12-26 NOTE — Progress Notes (Signed)
Patient continues to have excess secretions (brown in color) despite interventions. RT and RN suctioned. Patient cleaned up and mouth care completed. Report given to Dedra, RN who is assuming care. Bo McclintockBrewer,[place] S, RN

## 2017-12-26 NOTE — Progress Notes (Signed)
   12/26/17 0621  Clinical Encounter Type  Visited With Patient  Visit Type Initial  Referral From Nurse  Consult/Referral To Chaplain  Spiritual Encounters  Spiritual Needs Emotional  CH received page for rapid response. Patient was lethargic upon arrival. Displayed signs of apnea. Did not respond to verbal stimuli from any members of care team. Discharged liquid from nose periodically. Staff had reached out to sister but she seemed to be in denial. Assisted RN supervisor in calling sister again. Sister agreed to come to hospital with her husband. Waited bedside for family to arrive. Sister vented with CH about patient's care. Provided empathetic responses. Validated feelings. Pastoral care was appreciated.

## 2017-12-26 NOTE — Progress Notes (Signed)
Family arrived. MD called to bedside. [name] [name],[name], RN

## 2017-12-26 NOTE — Progress Notes (Signed)
WORK NOTE -------------------   Mr. [name] [name] has been at [facility] on 12/26/17 visiting his Father Mr. Darrick MeigsLinnies Splinter who is admitted here and is critically ill at this time.   So, Please excuse him for the day.  Thank you.  Enid Baasadhika Zeda Gangwer, MD [facility] [address], [place], [place] [address] Ph: [phone]

## 2017-12-26 NOTE — Progress Notes (Signed)
PT Cancellation Note  Patient Details Name: [name] LimLinnies Ege Jr. MRN: [id number] DOB: 1960/10/19   Cancelled Treatment:    Reason Eval/Treat Not Completed: Medical issues which prohibited therapy(Per notes, patient with decline in status.  Family considering transition to comfort care.  Will hold additional therapy at this time until goals of care firmly established and patient able to participate.)  Chinonso Linker H. Manson PasseyBrown, PT, DPT, NCS 12/26/17, 9:29 PM [phone]

## 2017-12-26 NOTE — Progress Notes (Signed)
Initial Nutrition Assessment  DOCUMENTATION CODES:   Obesity unspecified  INTERVENTION:   Recommend Jevity 1.5 @ goal rate of 7560ml/hr- Initiate at 7330ml/hr and increase by 2315ml/hr every 12 hours until goal rate is reached  Prostat liquid protein 30ml BID via tube  Free water flushes 150ml q 4 hours  Regimen provides 2360kcal/day, 122g/day protein, 32g/day fiber, 197594ml/day   Pt at high refeeding risk; recommend monitor K, Mg and P labs daily until stable after tube feed initiation.   NUTRITION DIAGNOSIS:   Inadequate oral intake related to acute illness as evidenced by NPO status.  GOAL:   Patient will meet greater than or equal to 90% of their needs  MONITOR:   Labs, Weight trends, Skin, I & O's, TF tolerance  REASON FOR ASSESSMENT:   NPO/Clear Liquid Diet    ASSESSMENT:   57 year old man with a history of Bipolar depression and substance abuse, DM, and CHF admitted with stroke and aspiration PNA   Visited pt's room today; unable to obtain any nutrition related history as pt with AMS and lethargic. Per chart, pt appears weight stable pta. Pt now NPO since admit and without adequate nutrition for 7 days. Pt seen by SLP unable to initiate diet; plan is for IR placed G-tube today. Tube feed recommendations above. Pt at high refeeding risk; recommend monitor K, Mg and P labs until stable after tube feed initiation.   Medications reviewed and include: aspirin, lovenox, insulin, scopolamine, unasyn   Labs reviewed: Na 152(H), BUN 41(H) cbgs- 157, 183, 170 x 24 hrs  NUTRITION - FOCUSED PHYSICAL EXAM:    Most Recent Value  Orbital Region  No depletion  Upper Arm Region  No depletion  Thoracic and Lumbar Region  No depletion  Buccal Region  No depletion  Temple Region  No depletion  Clavicle Bone Region  Mild depletion  Clavicle and Acromion Bone Region  Mild depletion  Scapular Bone Region  No depletion  Dorsal Hand  No depletion  Patellar Region  Mild depletion   Anterior Thigh Region  Mild depletion  Posterior Calf Region  Mild depletion  Edema (RD Assessment)  None  Hair  Reviewed  Eyes  Reviewed  Mouth  Reviewed  Skin  Reviewed  Nails  Reviewed     Diet Order:   Diet Order            Diet NPO time specified  Diet effective now             EDUCATION NEEDS:   Not appropriate for education at this time  Skin:  Skin Assessment: Reviewed RN Assessment  Last BM:  11/17  Height:   Ht Readings from Last 1 Encounters:  12/15/2017 5\' 11"  (1.803 m)    Weight:   Wt Readings from Last 1 Encounters:  12/17/2017 110.4 kg    Ideal Body Weight:  78.1 kg  BMI:  Body mass index is 33.93 kg/m.  Estimated Nutritional Needs:   Kcal:  2200-2500kcal/day   Protein:  110-121g/day  Fluid:  >2.2L/day   Betsey Holidayasey Jenisse Vullo MS, RD, LDN Pager #- [phone] Office#- [phone] After Hours Pager: [phone]

## 2017-12-26 NOTE — Progress Notes (Signed)
Rapid response code was called for this pt, oxygen saturations were 88 on 2 L of O2, pt's lung sounds were very junky/rhonchi and pt was non-responsive; pt was suctioned, sats increased to 99 % , O2 was increased to 3 L. Pt's sister was notified, she agreed to come to see pt this am, after nursing supervisor encouraging her to come. MD notified. Continue to monitor.

## 2017-12-26 NOTE — Progress Notes (Signed)
[employer] - [place] at [facility]   PATIENT NAME: [name] [name]    MR#:  [id number]  DATE OF BIRTH:  Jun 22, 1960  SUBJECTIVE:  CHIEF COMPLAINT:   Chief Complaint  Patient presents with  . Weakness   -has an episode of respiratory distress this AM- now on 4L o2 and increased apneic periods too - increased oro-pharyngeal secretions  REVIEW OF SYSTEMS:  Review of Systems  Unable to perform ROS: Mental status change    DRUG ALLERGIES:  No Known Allergies  VITALS:  Blood pressure (!) 151/105, pulse (!) 109, temperature (!) 100.6 F (38.1 C), temperature source Oral, resp. rate 16, height 5\' 11"  (1.803 m), weight 110.4 kg, SpO2 (!) 88 %.  PHYSICAL EXAMINATION:  Physical Exam   GENERAL:  57 y.o.-year-old obese, patient lying in the bed with no acute distress. critically  ill appearing EYES: Pupils equal, round, reactive to light and accommodation. No scleral icterus. Extraocular muscles intact.  HEENT: Head atraumatic, normocephalic. Oropharynx and nasopharynx clear.  NECK:  Supple, no jugular venous distention. No thyroid enlargement, no tenderness.  LUNGS: gurgling, rhonchorous upper airway secretions noted.  no wheezing, rales,or crepitation. No use of accessory muscles of respiration.  Coarse bibasilar breath sounds Occasional periods of apnea noted CARDIOVASCULAR: S1, S2 normal. No , rubs, or gallops.  Loud 3/6 systolic murmur is present ABDOMEN: Soft, nontender, nondistended. Bowel sounds present. No organomegaly or mass.  EXTREMITIES: No pedal edema, cyanosis, or clubbing.  NEUROLOGIC:  Lethargic, hiccups noted. No gag reflex or cough reflex PSYCHIATRIC: The patient is alert and following simple commands.  SKIN: No obvious rash, lesion, or ulcer.    LABORATORY PANEL:   CBC Recent Labs  Lab 12/25/17 0608  WBC 13.0*  HGB 15.9  HCT 50.4  PLT 255    ------------------------------------------------------------------------------------------------------------------  Chemistries  Recent Labs  Lab 12/26/17 0403  NA 152*  K 4.0  CL 118*  CO2 26  GLUCOSE 174*  BUN 41*  CREATININE 1.18  CALCIUM 8.5*   ------------------------------------------------------------------------------------------------------------------  Cardiac Enzymes Recent Labs  Lab 12/28/2017 2214  TROPONINI 0.04*   ------------------------------------------------------------------------------------------------------------------  RADIOLOGY:  US Carotid Bilateral  Result Date: 12/25/2017 CLINICAL DATA:  57 year old male with stroke-like symptoms 57 year old male EXAM: BILATERAL CAROTID DUPLEX ULTRASOUND TECHNIQUE: Wallace Cullens scale imaging, color Doppler and duplex ultrasound were performed of bilateral carotid and vertebral arteries in the neck. COMPARISON:  Prior duplex carotid ultrasound 11/09/2016 FINDINGS: Criteria: Quantification of carotid stenosis is based on velocity parameters that correlate the residual internal carotid diameter with NASCET-based stenosis levels, using the diameter of the distal internal carotid lumen as the denominator for stenosis measurement. The following velocity measurements were obtained: RIGHT ICA: 60/26 cm/sec CCA: 70/13 cm/sec SYSTOLIC ICA/CCA RATIO:  0.9 ECA:  103 cm/sec LEFT ICA: 61/30 cm/sec CCA: 68/20 cm/sec SYSTOLIC ICA/CCA RATIO:  0.9 ECA:  88 cm/sec RIGHT CAROTID ARTERY: Trace heterogeneous atherosclerotic plaque in the proximal internal carotid artery. By peak systolic velocity criteria, the estimated stenosis remains less than 50%. RIGHT VERTEBRAL ARTERY:  Patent with normal antegrade flow. LEFT CAROTID ARTERY: Trace heterogeneous atherosclerotic plaque in the proximal internal carotid artery. By peak systolic velocity criteria, the estimated stenosis remains less than 50%. LEFT VERTEBRAL ARTERY:  Patent with normal antegrade flow.  IMPRESSION: 1. Stable examination without evidence of disease progression compared to 11/09/2016. 2. Mild (1-49%) stenosis proximal right internal carotid artery secondary to trace smooth heterogeneous atherosclerotic plaque. 3. Mild (1-49%) stenosis proximal left internal carotid artery secondary to trace smooth  heterogeneous atherosclerotic plaque. Signed, Sterling Big, MD, RPVI [facility] Radiology Electronically Signed   By: Malachy Moan M.D.   On: 12/25/2017 15:59   Dg Chest Port 1 View  Result Date: 12/26/2017 CLINICAL DATA:  Difficulty breathing EXAM: PORTABLE CHEST 1 VIEW COMPARISON:  December 23, 2017 FINDINGS: There is slight right base atelectasis. The lungs elsewhere are clear. Heart is mildly enlarged with pulmonary vascularity normal. No adenopathy. No bone lesions. IMPRESSION: Slight right base atelectasis. Lungs elsewhere clear. Stable cardiac prominence. Electronically Signed   By: Bretta Bang III M.D.   On: 12/26/2017 07:17    EKG:   Orders placed or performed during the hospital encounter of 01/01/2018  . EKG 12-Lead  . EKG 12-Lead  . ED EKG  . ED EKG  . EKG 12-Lead  . EKG 12-Lead    ASSESSMENT AND PLAN:   57 year old male with past medical history of bipolar disorder, previous CVA, diabetes, hypertension, hyperlipidemia, polysubstance abuse, medical noncompliance who presents to the hospital due to generalized weakness, difficulty ambulating.  1.  Acute stroke-MRI showing acute left medullary CVA, has slight right-sided weakness.  -Also right-sided weakness.  Pharyngeal weakness and unable to swallow oral secretions.  Pooling of secretions noted requiring chest physical therapy and frequent suctioning. -Also hiccups and apneic period's-brainstem stroke -Appreciate neurology consult.  Patient on rectal aspirin only -Echocardiogram showing no thrombus. -MRA of the head and neck showing stenosis at several  levels of cranial arteries including basilar artery, posterior communicating artery, high-grade stenosis of right MCA and diffuse small vessel disease throughout ACA and MCA territories. -Unable to take oral medications.  NG tube attempted but patient pulled out. -Aspirin suppository for now -PT and OT  -    -will need SNF at discharge  2.  Aspiration pneumonitis-dysphagia from stroke.  Has had aspiration episodes twice since admission -Remains n.p.o.  Appreciate speech therapy input -finished unasyn.  Elevated WBC -NG tube attempted but patient pulled out -Now pooling secretions, dysphagia of oropharyngeal phase-will need PEG tube for nutrition. -Continue suctioning, scopolamine patch added  3.  Altered mental status-acute metabolic encephalopathy-from stroke and infection - concern for PRES from elevated BP- will work on getting BP controlled -On antibiotics -Repeat CT head with no acute findings -Improving mental status, patient is alert and following simple commands.  4.  Acute respiratory distress-secondary to aspiration episode.  High risk for recurrent aspirations due to pooling of oropharyngeal secretions -Suction as needed.  Continue oxygen support  5.  Hypertension-blood pressure is elevated as unable to take oral medications.  \ -Increased clonidine patch to 0.3 mg, added Nitropatch -Scheduled IV metoprolol and IV hydralazine -Continue IV hydralazine and IV labetalol PRN.   6.  Acute urinary retention-noted in the emergency room.  Foley catheter in place. -We will start Flomax if able to take oral medicines  7.  DVT prophylaxis-Lovenox  8.  Dysphagia-secondary to stroke.  GI consulted for PEG tube placement, however due to acute stroke and elevated pressures concern for drop in pressure and worsening of stroke under anesthesia. -IR consulted for percutaneous gastrostomy placement  9. Hypernatremia- fluids changed to D5W   Appreciate palliative care consult. -Patient  is a DNR now.    All the records are reviewed and case discussed with Care Management/Social Workerr. Management plans discussed with the patient, family and they are in agreement.  CODE STATUS: DNR  TOTAL CRITICAL CARE TIME SPENT IN TAKING CARE OF THIS PATIENT: 50 minutes.  POSSIBLE D/C IN ? DAYS, DEPENDING ON CLINICAL CONDITION.   Enid BaasKALISETTI,Jase Himmelberger M.D on 12/26/2017 at 12:39 PM  Between 7am to 6pm - Pager - [phone]  After 6pm go to [email or web address] - Social research officer, governmentpassword EPAS [facility]  [facility] [place] [facility]  Office  [phone]  CC: Primary care physician; Preston Fleetingevelo, Adrian Mancheno, MD

## 2017-12-26 NOTE — Progress Notes (Signed)
Extensive discussion with patient's family-sister, son and daughters at bedside and privately today after the aspiration episode They have confirmed that patient is a DO NOT RESUSCITATE.  Discussed about intubation and trach if needed if his respiratory status continues to go down.  Appreciate palliative care input as well.  During the discussion around 10:30 AM today, family has decided no intubation or tracheostomy, okay for percutaneous gastrostomy tube placement for nutrition at this time.

## 2017-12-26 NOTE — Progress Notes (Signed)
Subjective: Patient had significant event this morning requiring rapid response to be called. Per nursing staff, patient became unresponsive with saturations drop in the low 80's. He was previously noted to have difficulty controlling his secretions requiring multiple suctioning. Blood pressure remains elevated despite being on multiple antihypertensives. Family currently at bedside. Patient remains lethargic but able to follow simple commands.  Objective: Current vital signs: BP (!) 151/105 (BP Location: Right Arm)   Pulse (!) 109   Temp (!) 100.6 F (38.1 C) (Oral)   Resp 16   Ht 5\' 11"  (1.803 m)   Wt 110.4 kg   SpO2 (!) 88% Comment: increased to 4lpm  BMI 33.93 kg/m  Vital signs in last 24 hours: Temp:  [98.3 F (36.8 C)-100.6 F (38.1 C)] 100.6 F (38.1 C) (11/22 0834) Pulse Rate:  [92-112] 109 (11/22 0834) Resp:  [16-24] 16 (11/22 0616) BP: (151-190)/(88-140) 151/105 (11/22 0834) SpO2:  [87 %-100 %] 88 % (11/22 0728)  Intake/Output from previous day: 11/21 0701 - 11/22 0700 In: 300.5 [IV Piggyback:300.5] Out: 1925 [Urine:1525; Emesis/NG output:400] Intake/Output this shift: Total I/O In: -  Out: 700 [Other:700] Nutritional status:  Diet Order            Diet NPO time specified  Diet effective now             Neurologic Exam:  Mental Status: Lethargic.  Nods head appropriately to questions being asked.  Severe dysarthria.  Able to follows simple commands such as hand squeezing and sticking tongue out.. Cranial Nerves: II: Blinks to bilateral confrontation.   III,IV, VI: ptosis not present, extra-ocular motions intact bilaterally V,VII: Left facial droop VIII: hearing normal bilaterally IX,X: gag reflex reduced XI: bilateral shoulder shrug XII: midline tongue extension Motor: Able to lift all extremities against gravity with no focal weakness noted.    Lab Results: Basic Metabolic Panel: Recent Labs  Lab 12/13/2017 1038 12/21/17 0245 12/22/17 0520  12/25/17 0608 12/26/17 0403  NA 140 143 147* 152* 152*  K 4.0 3.8 3.9 3.7 4.0  CL 107 110 115* 116* 118*  CO2 23 23 25 27 26   GLUCOSE 200* 225* 185* 114* 174*  BUN 20 25* 36* 31* 41*  CREATININE 1.16 1.35* 1.10 1.00 1.18  CALCIUM 9.3 9.2 8.9 8.6* 8.5*    Liver Function Tests: No results for input(s): AST, ALT, ALKPHOS, BILITOT, PROT, ALBUMIN in the last 168 hours. No results for input(s): LIPASE, AMYLASE in the last 168 hours. No results for input(s): AMMONIA in the last 168 hours.  CBC: Recent Labs  Lab 12/19/2017 1038 12/21/17 0245 12/22/17 0520 12/25/17 0608  WBC 10.7* 17.6* 18.0* 13.0*  NEUTROABS  --   --  15.4*  --   HGB 15.3 14.6 14.3 15.9  HCT 45.4 42.3 43.3 50.4  MCV 87.8 87.0 90.6 93.9  PLT 243 243 243 255    Cardiac Enzymes: Recent Labs  Lab 12/29/2017 1038 12/11/2017 1533 12/09/2017 1835 12/07/2017 2214  TROPONINI 0.04* 0.04* 0.04* 0.04*    Lipid Panel: Recent Labs  Lab 12/21/17 0245  CHOL 104  TRIG 47  HDL 38*  CHOLHDL 2.7  VLDL 9  LDLCALC 57    CBG: Recent Labs  Lab 12/25/17 1656 12/25/17 2119 12/26/17 0628 12/26/17 0750 12/26/17 1147  GLUCAP 131* 106* 157* 183* 170*    Microbiology: Results for orders placed or performed during the hospital encounter of 11/07/16  MRSA PCR Screening     Status: None   Collection Time: 11/07/16  5:30  PM  Result Value Ref Range Status   MRSA by PCR NEGATIVE NEGATIVE Final    Comment:        The GeneXpert MRSA Assay (FDA approved for NASAL specimens only), is one component of a comprehensive MRSA colonization surveillance program. It is not intended to diagnose MRSA infection nor to guide or monitor treatment for MRSA infections.     Coagulation Studies: Recent Labs    12/26/17 0403  LABPROT 14.2  INR 1.11    Imaging: [place] Carotid Bilateral  Result Date: 12/25/2017 CLINICAL DATA:  57 year old male with stroke-like symptoms 57 year old male EXAM: BILATERAL CAROTID DUPLEX ULTRASOUND  TECHNIQUE: Wallace CullensGray scale imaging, color Doppler and duplex ultrasound were performed of bilateral carotid and vertebral arteries in the neck. COMPARISON:  Prior duplex carotid ultrasound 11/09/2016 FINDINGS: Criteria: Quantification of carotid stenosis is based on velocity parameters that correlate the residual internal carotid diameter with NASCET-based stenosis levels, using the diameter of the distal internal carotid lumen as the denominator for stenosis measurement. The following velocity measurements were obtained: RIGHT ICA: 60/26 cm/sec CCA: 70/13 cm/sec SYSTOLIC ICA/CCA RATIO:  0.9 ECA:  103 cm/sec LEFT ICA: 61/30 cm/sec CCA: 68/20 cm/sec SYSTOLIC ICA/CCA RATIO:  0.9 ECA:  88 cm/sec RIGHT CAROTID ARTERY: Trace heterogeneous atherosclerotic plaque in the proximal internal carotid artery. By peak systolic velocity criteria, the estimated stenosis remains less than 50%. RIGHT VERTEBRAL ARTERY:  Patent with normal antegrade flow. LEFT CAROTID ARTERY: Trace heterogeneous atherosclerotic plaque in the proximal internal carotid artery. By peak systolic velocity criteria, the estimated stenosis remains less than 50%. LEFT VERTEBRAL ARTERY:  Patent with normal antegrade flow. IMPRESSION: 1. Stable examination without evidence of disease progression compared to 11/09/2016. 2. Mild (1-49%) stenosis proximal right internal carotid artery secondary to trace smooth heterogeneous atherosclerotic plaque. 3. Mild (1-49%) stenosis proximal left internal carotid artery secondary to trace smooth heterogeneous atherosclerotic plaque. Signed, Sterling BigHeath K. McCullough, MD, RPVI [facility] Electronically Signed   By: Malachy MoanHeath  McCullough M.D.   On: 12/25/2017 15:59   Dg Chest Port 1 View  Result Date: 12/26/2017 CLINICAL DATA:  Difficulty breathing EXAM: PORTABLE CHEST 1 VIEW COMPARISON:  December 23, 2017 FINDINGS: There is slight right base atelectasis. The lungs elsewhere are  clear. Heart is mildly enlarged with pulmonary vascularity normal. No adenopathy. No bone lesions. IMPRESSION: Slight right base atelectasis. Lungs elsewhere clear. Stable cardiac prominence. Electronically Signed   By: Bretta BangWilliam  Woodruff III M.D.   On: 12/26/2017 07:17    Medications:  I have reviewed the patient's current medications. Prior to Admission:  Medications Prior to Admission  Medication Sig Dispense Refill Last Dose  . amLODipine (NORVASC) 10 MG tablet Take 1 tablet (10 mg total) by mouth daily. 30 tablet 0 Taking  . aspirin 81 MG EC tablet Take 1 tablet (81 mg total) by mouth daily. 30 tablet 0 Taking  . atorvastatin (LIPITOR) 20 MG tablet Take 1 tablet (20 mg total) by mouth daily at 6 PM. 30 tablet 0 Taking  . busPIRone (BUSPAR) 10 MG tablet Take by mouth.   Taking  . butalbital-acetaminophen-caffeine (FIORICET, ESGIC) 50-325-40 MG tablet Take 1-2 tablets by mouth every 6 (six) hours as needed for headache. 20 tablet 0   . chlorthalidone (HYGROTON) 25 MG tablet Take 1 tablet (25 mg total) by mouth daily. 30 tablet 0 Taking  . cloNIDine (CATAPRES) 0.3 MG tablet Take by mouth.   Taking  . clopidogrel (PLAVIX) 75 MG tablet Take 1 tablet (75  mg total) by mouth daily. (Patient not taking: Reported on 03/31/2017) 30 tablet 0 Not Taking  . cyclobenzaprine (FLEXERIL) 10 MG tablet Take 1 tablet (10 mg total) by mouth 3 (three) times daily as needed. 15 tablet 0   . Diclofenac Sodium 3 % GEL Place onto the skin.   Taking  . doxepin (SINEQUAN) 10 MG capsule Take 10 mg by mouth daily.   Taking  . FLUARIX QUADRIVALENT 0.5 ML injection inject 0.5 milliliter intramuscularly  0 Not Taking  . ibuprofen (ADVIL,MOTRIN) 600 MG tablet Take 1 tablet (600 mg total) by mouth every 8 (eight) hours as needed. 15 tablet 0   . insulin aspart (NOVOLOG) 100 UNIT/ML injection Inject 0-9 Units into the skin 3 (three) times daily with meals. 10 mL 11 Taking  . insulin aspart (NOVOLOG) 100 UNIT/ML injection Inject  0-5 Units into the skin at bedtime. 10 mL 11 Taking  . Insulin Glargine (LANTUS SOLOSTAR) 100 UNIT/ML Solostar Pen Inject into the skin.   Taking  . insulin regular (NOVOLIN R,HUMULIN R) 100 units/mL injection Inject into the skin.   Taking  . lisinopril (PRINIVIL,ZESTRIL) 40 MG tablet Take by mouth.   Taking  . metFORMIN (GLUCOPHAGE) 500 MG tablet Take one tablet by mouth twice a day   Taking  . metoprolol tartrate (LOPRESSOR) 25 MG tablet Take 1 tablet (25 mg total) by mouth 2 (two) times daily. 60 tablet 0 Taking  . naproxen (NAPROSYN) 500 MG tablet   0 Taking  . nitroGLYCERIN (NITROSTAT) 0.4 MG SL tablet Place 1 tablet (0.4 mg total) under the tongue every 5 (five) minutes as needed for chest pain. 15 tablet 0 Taking  . oxyCODONE-acetaminophen (PERCOCET/ROXICET) 5-325 MG tablet take 1 tablet by mouth every 6 hours if needed for severe pain  0 Taking  . pantoprazole (PROTONIX) 40 MG tablet Take 1 tablet (40 mg total) by mouth 2 (two) times daily before a meal. (Patient not taking: Reported on 03/31/2017) 30 tablet 0 Not Taking  . sertraline (ZOLOFT) 50 MG tablet Take by mouth.   Taking  . thiamine 100 MG tablet Take 1 tablet (100 mg total) by mouth daily. 30 tablet 0 Taking  . traMADol (ULTRAM) 50 MG tablet Take 1 tablet (50 mg total) by mouth every 12 (twelve) hours as needed. 12 tablet 0   . traZODone (DESYREL) 150 MG tablet Take 1 tablet (150 mg total) by mouth at bedtime. 30 tablet 0 Taking   Scheduled: . amLODipine  10 mg Oral Daily  . aspirin  300 mg Rectal Daily  . atorvastatin  20 mg Oral q1800  . budesonide (PULMICORT) nebulizer solution  0.5 mg Nebulization BID  . [START ON 2018/01/02] cloNIDine  0.3 mg Transdermal Weekly  . enoxaparin (LOVENOX) injection  40 mg Subcutaneous Q24H  . glycopyrrolate  0.2 mg Intravenous Q6H  . hydrALAZINE  10 mg Intravenous QID  . insulin aspart  0-20 Units Subcutaneous TID WC  . insulin aspart  0-5 Units Subcutaneous QHS  . ipratropium-albuterol   3 mL Nebulization BID  . mouth rinse  15 mL Mouth Rinse BID  . mirtazapine  15 mg Oral QHS  . nitroGLYCERIN  0.3 mg Transdermal Daily  . scopolamine  1 patch Transdermal Q72H   Patient seen and examined.  Clinical course and management discussed.  Necessary edits performed.  I agree with the above.  Assessment and plan of care developed and discussed below.    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 Continues to have difficulty with swallowing and handling secretions with high risk of aspiration and respiratory compromise.  Treatment options have been discussed with family and there is some issue as to who will be the final decision maker.  Family to conference among themselves.  All questions and concerns addressed.    Recommendations: 1. Agree with continued aggressive management of BP 2. Agree with continued rectal ASA 3. Will proceed with further care as directed by family   LOS: 6 days   12/26/2017  1:04 PM  Thana Farr, MD Neurology [phone]  12/26/2017  2:26 PM

## 2017-12-26 NOTE — Progress Notes (Signed)
Per Dr Nemiah CommanderKalisetti- discontinue all stroke orders. Routine vitals.

## 2017-12-27 LAB — CREATININE, SERUM
CREATININE: 3.12 mg/dL — AB (ref 0.61–1.24)
GFR calc Af Amer: 24 mL/min — ABNORMAL LOW (ref >60)
GFR, EST NON AFRICAN AMERICAN: 21 mL/min — AB (ref >60)

## 2017-12-27 LAB — GLUCOSE, CAPILLARY: GLUCOSE-CAPILLARY: 226 mg/dL — AB (ref 70–99)

## 2017-12-27 MED ORDER — MORPHINE SULFATE (PF) 2 MG/ML IV SOLN
2.0000 mg | Freq: Once | INTRAVENOUS | Status: AC
  Administered 2017-12-27: 2 mg via INTRAVENOUS
  Filled 2017-12-27: qty 1

## 2018-01-04 NOTE — Progress Notes (Signed)
Family has not decided on Funeral home yet Nursing Supervisor will call sister when she gets home to find out about their decision,  Eye care and prep was completed in am this morning, patient to be transported to hospital morgue awaiting family decision  on funeral home

## 2018-01-04 NOTE — Progress Notes (Signed)
   04-06-17 0915  Clinical Encounter Type  Visited With Health care provider  Visit Type Follow-up;Spiritual support;Death  Referral From Nurse  Consult/Referral To Chaplain  Spiritual Encounters  Spiritual Needs Prayer;Emotional;Other (Comment)   CH was notified of the patient's death while rounding on 1C. I provided pastoral presences for the staff. I also prayed for the family who had not arrived yet. I notified the unit clerk that I would be available for the family if needed.

## 2018-01-04 NOTE — Progress Notes (Signed)
After being called to room and placing CPAP on pt,  physician asked that the pt go on BIPAP.  Left patient room and went to retrieve BIPAP from ICU and upon arrival back to pt room it had been decided pt was not to go BIPAP.  MD, charge RN, and patient's RN were in the room when this writer returned.

## 2018-01-04 NOTE — Progress Notes (Signed)
Patient passed away in Rm at 9:08 pm this morning, Dr Patience MuscaPaven Pyreddy and respiratory therapist in room at time of passing

## 2018-01-04 NOTE — Clinical Social Work Note (Signed)
CSW has checked [place] MUST for possible PASRR update. The patient's PASRR is still pending; the patient cannot discharge to SNF until the PASRR has been set. CSW is following.  Naydeen Speirs Martha Teriyah Purington, MSW, ThArgentina Pondereresia MajorsLCSWA [phone]

## 2018-01-04 NOTE — Plan of Care (Signed)
  Problem: Education: Goal: Knowledge of General Education information will improve Description Including pain rating scale, medication(s)/side effects and non-pharmacologic comfort measures Outcome: Not Progressing   Problem: Safety: Goal: Ability to remain free from injury will improve Outcome: Not Progressing   Problem: Urinary Elimination: Goal: Signs and symptoms of infection will decrease Outcome: Not Progressing   Problem: Education: Goal: Knowledge of disease or condition will improve Outcome: Not Progressing Goal: Knowledge of secondary prevention will improve Outcome: Not Progressing Goal: Knowledge of patient specific risk factors addressed and post discharge goals established will improve Outcome: Not Progressing   Problem: Coping: Goal: Will verbalize positive feelings about self Outcome: Not Progressing   Problem: Health Behavior/Discharge Planning: Goal: Ability to manage health-related needs will improve Outcome: Not Progressing   Problem: Self-Care: Goal: Ability to participate in self-care as condition permits will improve Outcome: Not Progressing Goal: Verbalization of feelings and concerns over difficulty with self-care will improve Outcome: Not Progressing Goal: Ability to communicate needs accurately will improve Outcome: Not Progressing   Problem: Nutrition: Goal: Risk of aspiration will decrease Outcome: Not Progressing

## 2018-01-04 NOTE — Progress Notes (Addendum)
[facility] [place] [facility]   PATIENT NAME: [name] [name]    MR#:  [id number]  DATE OF BIRTH:  Mar 06, 1960  SUBJECTIVE:  CHIEF COMPLAINT:   Chief Complaint  Patient presents with  . Weakness   -has increased respiratory distress this AM- now on 6liter o2 and increased apneic periods too - increased oro-pharyngeal secretions -Unresponsive  REVIEW OF SYSTEMS:  Review of Systems  Unable to perform ROS: Mental status change    DRUG ALLERGIES:  No Known Allergies  VITALS:  Blood pressure (!) 89/69, pulse (!) 130, temperature 100.1 F (37.8 C), temperature source Axillary, resp. rate 15, height 5\' 11"  (1.803 m), weight 110.4 kg, SpO2 (!) 72 %.  PHYSICAL EXAMINATION:  Physical Exam   GENERAL:  57 y.o.-year-old obese, patient lying in the bed with no acute distress. critically  ill appearing On oxygen via high flow nasal cannula EYES: Pupils equal, round, sluggishly reactive to light and accommodation. No scleral icterus. Extraocular muscles intact.  HEENT: Head atraumatic, normocephalic. Oropharynx secretions noted and nasopharynx clear.  NECK:  Supple, no jugular venous distention. No thyroid enlargement, no tenderness.  LUNGS: gurgling, rhonchorous upper airway secretions noted.  no wheezing, rales,or crepitation. No use of accessory muscles of respiration.  Coarse bibasilar breath sounds Occasional periods of apnea noted CARDIOVASCULAR: S1, S2 normal. No , rubs, or gallops.  Loud 3/6 systolic murmur is present ABDOMEN: Soft, nontender, nondistended. Bowel sounds present. No organomegaly or mass.  EXTREMITIES: No pedal edema, cyanosis, or clubbing.  NEUROLOGIC:  Lethargic, hiccups noted. No gag reflex or cough reflex PSYCHIATRIC: Not alert and oriented to time place and person SKIN: No obvious rash, lesion, or ulcer.    LABORATORY PANEL:   CBC Recent Labs  Lab 12/25/17 0608  WBC 13.0*  HGB 15.9  HCT 50.4  PLT 255    ------------------------------------------------------------------------------------------------------------------  Chemistries  Recent Labs  Lab 12/26/17 0403 2018-01-25 0410  NA 152*  --   K 4.0  --   CL 118*  --   CO2 26  --   GLUCOSE 174*  --   BUN 41*  --   CREATININE 1.18 3.12*  CALCIUM 8.5*  --    ------------------------------------------------------------------------------------------------------------------  Cardiac Enzymes Recent Labs  Lab 12/11/2017 2214  TROPONINI 0.04*   ------------------------------------------------------------------------------------------------------------------  RADIOLOGY:  US Carotid Bilateral  Result Date: 12/25/2017 CLINICAL DATA:  57 year old male with stroke-like symptoms 57 year old male EXAM: BILATERAL CAROTID DUPLEX ULTRASOUND TECHNIQUE: Wallace Cullens scale imaging, color Doppler and duplex ultrasound were performed of bilateral carotid and vertebral arteries in the neck. COMPARISON:  Prior duplex carotid ultrasound 11/09/2016 FINDINGS: Criteria: Quantification of carotid stenosis is based on velocity parameters that correlate the residual internal carotid diameter with NASCET-based stenosis levels, using the diameter of the distal internal carotid lumen as the denominator for stenosis measurement. The following velocity measurements were obtained: RIGHT ICA: 60/26 cm/sec CCA: 70/13 cm/sec SYSTOLIC ICA/CCA RATIO:  0.9 ECA:  103 cm/sec LEFT ICA: 61/30 cm/sec CCA: 68/20 cm/sec SYSTOLIC ICA/CCA RATIO:  0.9 ECA:  88 cm/sec RIGHT CAROTID ARTERY: Trace heterogeneous atherosclerotic plaque in the proximal internal carotid artery. By peak systolic velocity criteria, the estimated stenosis remains less than 50%. RIGHT VERTEBRAL ARTERY:  Patent with normal antegrade flow. LEFT CAROTID ARTERY: Trace heterogeneous atherosclerotic plaque in the proximal internal carotid artery. By peak systolic velocity criteria, the estimated stenosis remains less than 50%. LEFT  VERTEBRAL ARTERY:  Patent with normal antegrade flow. IMPRESSION: 1. Stable examination without evidence of disease progression  compared to 11/09/2016. 2. Mild (1-49%) stenosis proximal right internal carotid artery secondary to trace smooth heterogeneous atherosclerotic plaque. 3. Mild (1-49%) stenosis proximal left internal carotid artery secondary to trace smooth heterogeneous atherosclerotic plaque. Signed, Sterling BigHeath K. McCullough, MD, RPVI [facility] Electronically Signed   By: Malachy MoanHeath  McCullough M.D.   On: 12/25/2017 15:59   Dg Chest Port 1 View  Result Date: 12/26/2017 CLINICAL DATA:  Difficulty breathing EXAM: PORTABLE CHEST 1 VIEW COMPARISON:  December 23, 2017 FINDINGS: There is slight right base atelectasis. The lungs elsewhere are clear. Heart is mildly enlarged with pulmonary vascularity normal. No adenopathy. No bone lesions. IMPRESSION: Slight right base atelectasis. Lungs elsewhere clear. Stable cardiac prominence. Electronically Signed   By: Bretta BangWilliam  Woodruff III M.D.   On: 12/26/2017 07:17    EKG:   Orders placed or performed during the hospital encounter of 12/25/2017  . EKG 12-Lead  . EKG 12-Lead  . ED EKG  . ED EKG  . EKG 12-Lead  . EKG 12-Lead    ASSESSMENT AND PLAN:   57 year old male with past medical history of bipolar disorder, previous CVA, diabetes, hypertension, hyperlipidemia, polysubstance abuse, medical noncompliance who presents to the hospital due to generalized weakness, difficulty ambulating.  1.Acute hypoxic respiratory failure Patient is DNR by CODE STATUS Patient has difficulty maintaining oxygen saturation High flow oxygen via nasal cannula If needed we will upgrade to BiPAP Called patient's daughter and sister and informed of the overall poor medical condition of the patient and the rapidly deteriorating medical condition.  Prognosis appears very poor.  2.  Acute stroke-MRI showing acute left  medullary CVA, has slight right-sided weakness.  -Also right-sided weakness.  Pharyngeal weakness and unable to swallow oral secretions.  Pooling of secretions noted requiring chest physical therapy and frequent suctioning. -Also hiccups and apneic period's-brainstem stroke -Appreciate neurology consult.  Patient on rectal aspirin only -Echocardiogram showing no thrombus. 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 -Unable to take oral medications.  NG tube attempted but patient pulled out. -Aspirin suppository for now -PT and OT when clinically stable  3.  Aspiration pneumonitis-dysphagia from stroke.  Has had aspiration episodes twice since admission -Remains n.p.o.  Appreciate speech therapy input -finished unasyn.  Elevated WBC -NG tube attempted but patient pulled out yesterday -Now pooling secretions, dysphagia of oropharyngeal phase -Continue suctioning, scopolamine patch added  4.  Altered mental status-acute metabolic encephalopathy-from stroke and infection - concern for PRES from elevated BP- will work on getting BP controlled -On antibiotics -Repeat CT head with no acute findings -Not oriented to time place and person.  Has decreased responsiveness  5.  Acute respiratory distress-secondary to aspiration episode with severe hypoxia.  High risk for recurrent aspirations due to pooling of oropharyngeal secretions -Suction as needed.  Continue oxygen support via nasal cannula at high flow and BiPAP as needed  5.  Hypertension-blood pressure is elevated as unable to take oral medications.  \ -Increased clonidine patch to 0.3 mg, added Nitropatch -Scheduled IV metoprolol and IV hydralazine -Continue IV hydralazine and IV labetalol PRN.   6.  Acute urinary retention-noted in the  emergency room.  Foley catheter in place. -We will start Flomax if able to take oral medicines  7.  DVT prophylaxis-Lovenox  8.  Dysphagia-secondary to stroke.  GI consulted for  PEG tube placement, however due to acute stroke and elevated pressures concern for drop in pressure and worsening of stroke under anesthesia. -IR consulted for percutaneous gastrostomy placement   9.  Status post  palliative care consult. -Patient is DNR    All the records are reviewed and case discussed with Care Management/Social Workerr. Management plans discussed with the patient, family and they are in agreement.  CODE STATUS: DNR  TOTAL  TIME SPENT IN TAKING CARE OF THIS PATIENT: 34 minutes.   POSSIBLE D/C IN ? DAYS, DEPENDING ON CLINICAL CONDITION.   Ihor Austin M.D on Jan 08, 2018 at 11:16 AM  Between 7am to 6pm - Pager - [phone]  After 6pm go to [email or web address] - Social research officer, government  [facility]  Office  [phone]  CC: Primary care physician; Preston Fleeting, MD   Patient continued to deteriorate Had apneic episodes Oxygen saturation low even in spite of being on high flow nasal cannula BiPAP attempted Patient had increased oral secretions unable to tolerate Overall medical condition looks very poor Prognosis poor Family updated

## 2018-01-04 NOTE — Death Summary Note (Signed)
Death summary Date of death 12/09/2017  Cause of death 1.  Acute cardiorespiratory failure 2.  Acute hypoxic respiratory failure 3.  Acute stroke 4.  Pneumonia 5.  Encephalopathy  Brief history of present illness and course of hospital stay 57 year old male patient with history of CVA, aortic stenosis bipolar disorder diabetes mellitus type 2, GERD polysubstance abuse was admitted on 25-Nov-2017 for difficulty in ambulation, weakness with respiratory distress and hypoxia suspected to have aspiration pneumonitis.  Patient was admitted and started on IV antibiotics and IV steroids along with nebulization treatments.  Patient continued aspirin and statin medication for history of CVA.  He was worked up with CT head and MRI brain.  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  Speech therapy evaluated the patient.  Patient received physical therapy and Occupational Therapy.  Patient had aspiration episodes during the hospitalization.  He was made n.p.o.. He had acute encephalopathy from stroke and infection and continued antibiotics.  Neurology consultation was done and patient was followed by neurology.  And had increased oral secretions pharyngeal weakness.  Was worked up with echocardiogram which showed no thrombus.  He was also worked up with MRI brain, MRA brain and carotid ultrasound.  Patient was unable to take any oral medications.  He continued antibiotics for aspiration pneumonitis.  Patient mental status did not improve and he had severe dysphagia and continued to be high risk of aspiration.  As patient's medical condition did not improve palliative medicine consult was done.  Patient was made DNR by CODE STATUS.  Patient continued to deteriorate had aspiration episodes, hypoxia needed-oxygen via nasal cannula.  Unable to tolerate oral diet and medications.  Had increased respiratory distress as well as apneic episodes with increased oropharyngeal secretions.  Continued  to deteriorate.  During hospitalization patient was seen by neurology attending and psychiatry attending. family was updated.  On 12/30/2017 patient had a respiratory distress severe hypoxia unable to maintaining oxygen saturations in spite of oxygen via high flow nasal cannula as well as BiPAP.  Patient's respirations stopped and heartbeat stopped.  The patient expired on 12/31/2017.  Family was updated.

## 2018-01-04 NOTE — Progress Notes (Signed)
Called sister Arvid Rightnn Jeffries sister,, they said they had already been informed by Dr Tobi BastosPyreddy that patient had passed away and were on their way to hospital . Dr Tobi BastosPyreddy in room at time of death, Nursing Supervisor and [facility]  already notified.

## 2018-01-04 DEATH — deceased

## 2018-03-29 IMAGING — CT CT HEAD W/O CM
3 series · 14 of 47 positions shown, 16 images · non-contrast
Comparison: CT HEAD July 01, 2015 and MRI head October 20, 2014

CLINICAL DATA: Central chest pain and headache beginning yesterday,
hypertensive. History of stroke and polysubstance abuse.

EXAM:
CT HEAD WITHOUT CONTRAST
TECHNIQUE: Contiguous axial images were obtained from the base of the skull
through the vertex without intravenous contrast.

[Series 2: head wo · axial · 0.47mm/px · z∈[-147,-12]mm · 8 of 33 slices shown, 10 images]
[im 3/33  brain]
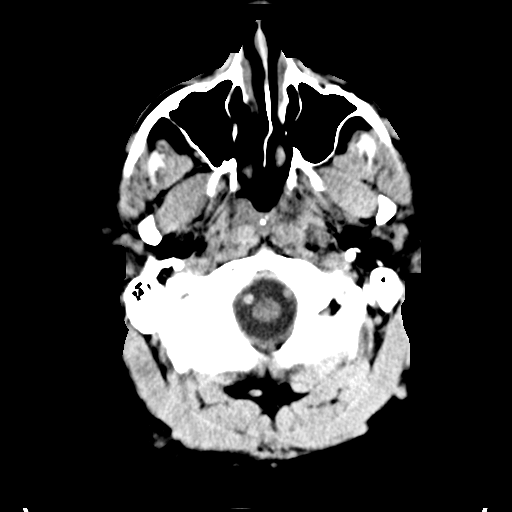
[im 3/33  bone]
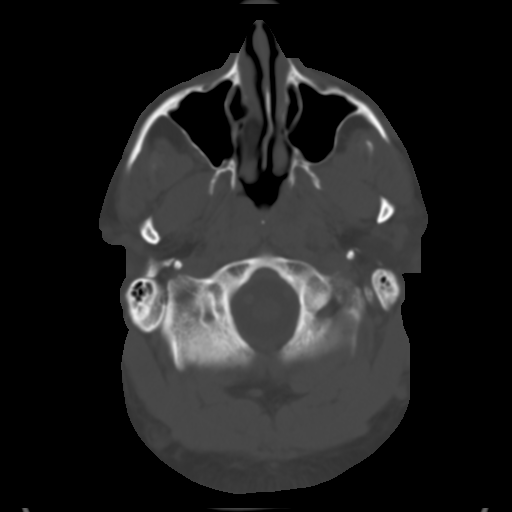
[im 7/33  brain]
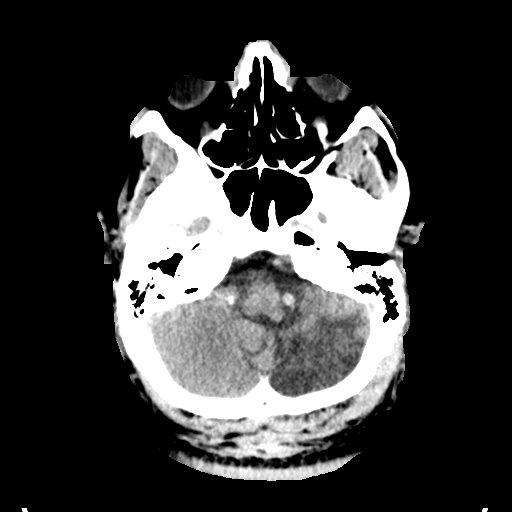
[im 10/33  brain]
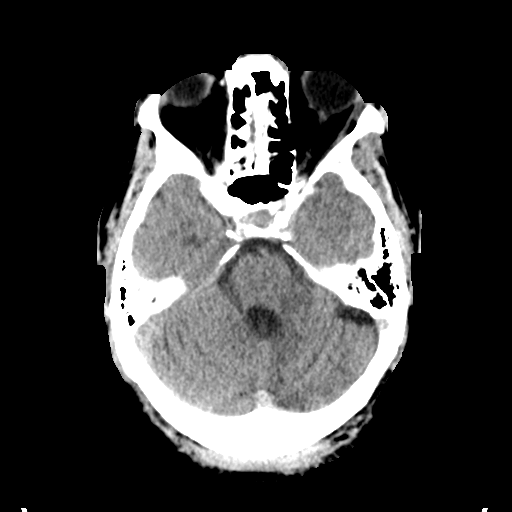
[im 15/33  brain]
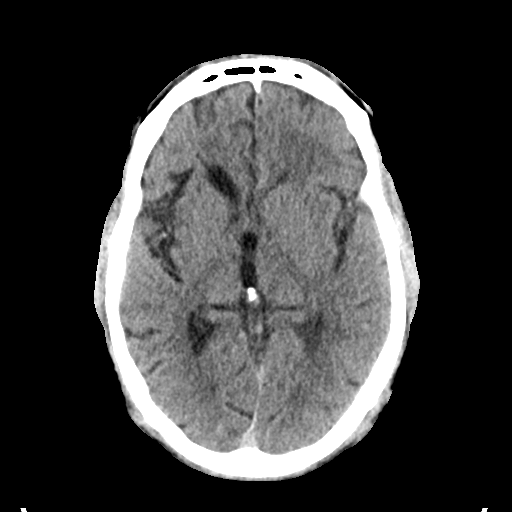
[im 18/33  brain]
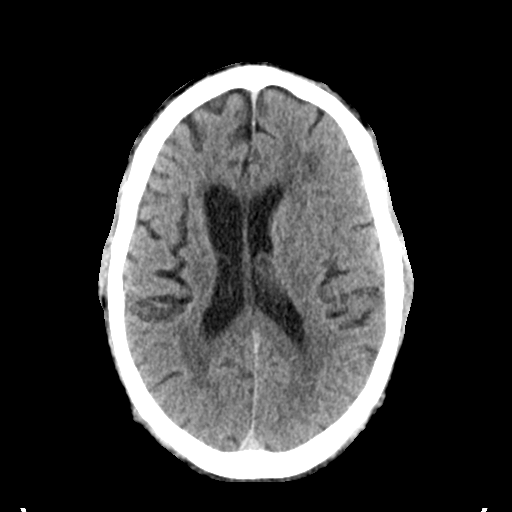
[im 18/33  bone]
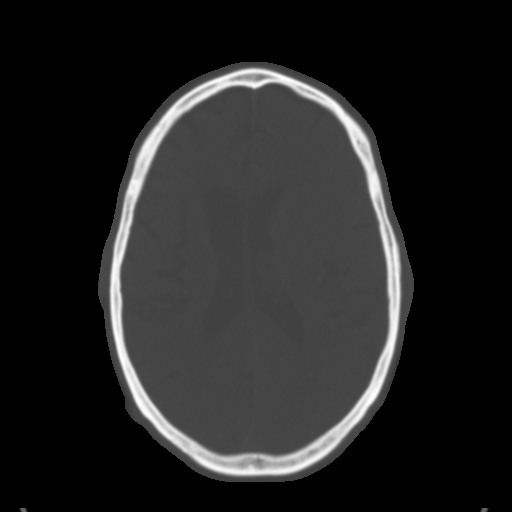
[im 23/33  brain]
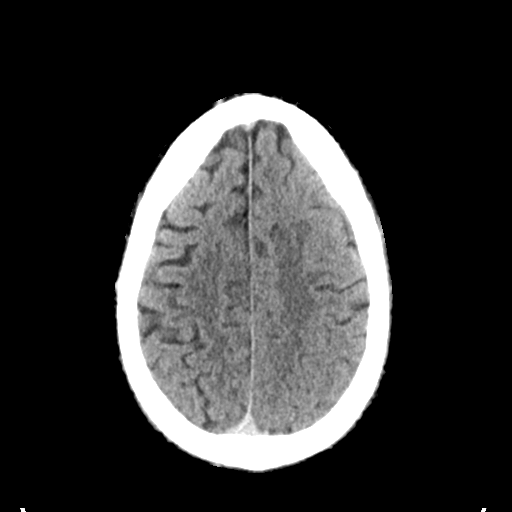
[im 26/33  brain]
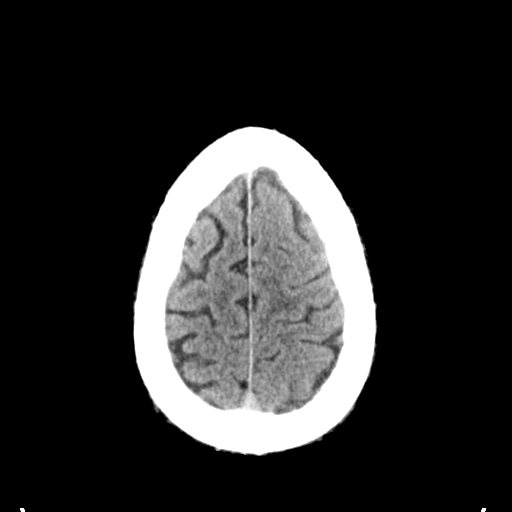
[im 30/33  brain]
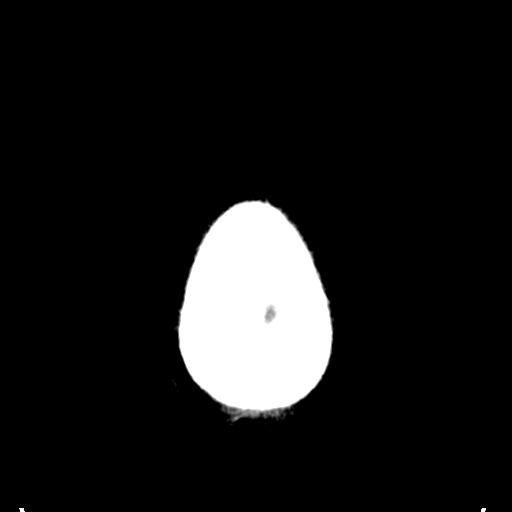

[Series 4: coronal soft tissue · coronal · 0.30mm/px · 3 of 68 slices shown]
[im 23/68  brain]
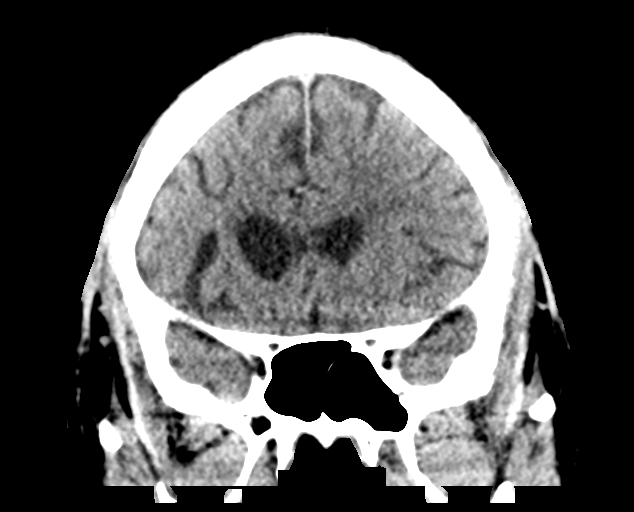
[im 30/68  brain]
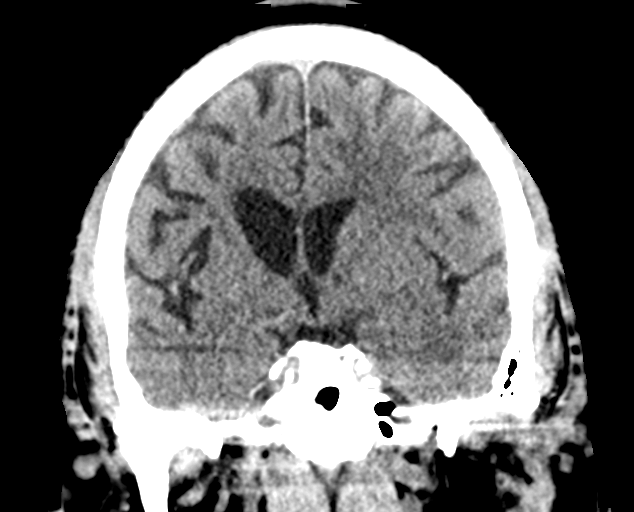
[im 38/68  brain]
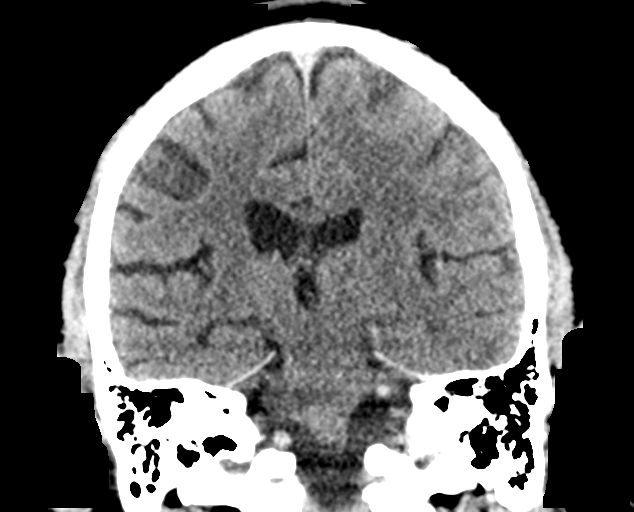

[Series 5: sagittal soft tissue · sagittal · 0.36mm/px · 3 of 54 slices shown]
[im 18/54  brain]
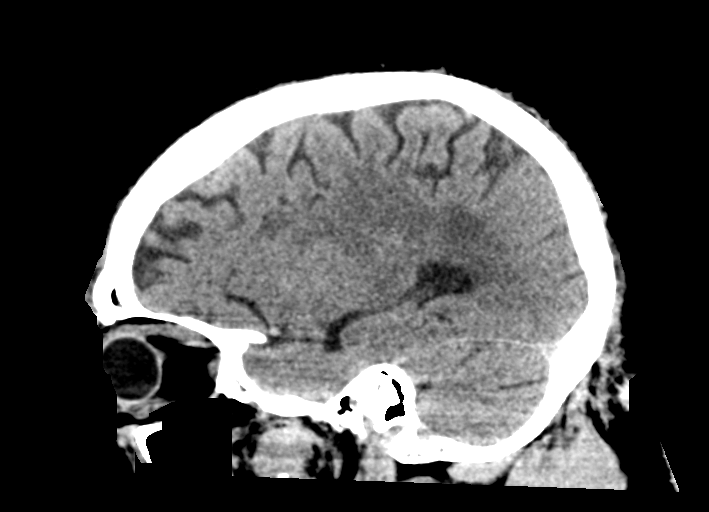
[im 27/54  brain]
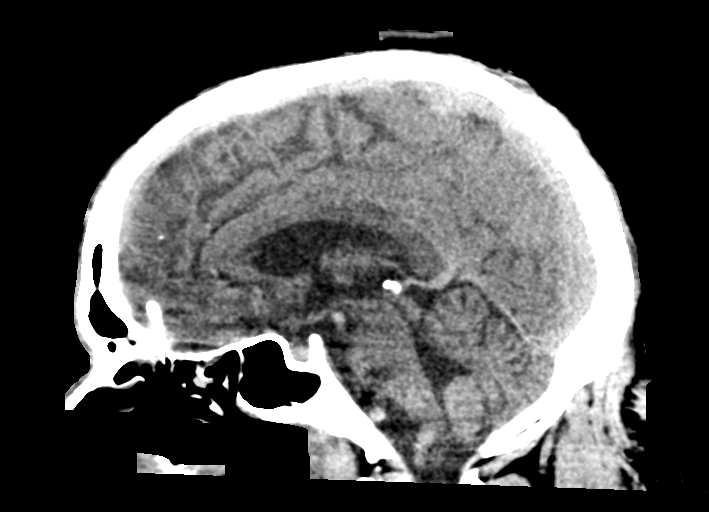
[im 36/54  brain]
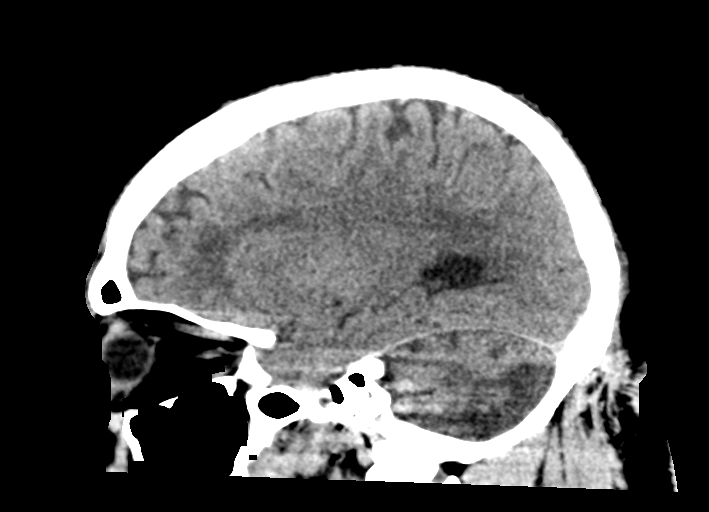

[14 of 47 positions shown; findings below may reference images not displayed]

FINDINGS: BRAIN: LEFT inferior cerebellar encephalomalacia. No
intraparenchymal hemorrhage, mass effect nor midline shift. No acute
large vascular territory infarcts. Vague RIGHT frontal lobe atrophy
at site of prior infarct with with mild ex vacuo dilatation RIGHT
frontal horn of lateral ventricle, no hydrocephalus. Diminutive
RIGHT cerebral peduncle compatible with wallerian degeneration. N
patchy white matter changes compatible with moderate to severe
chronic small vessel ischemic disease. O abnormal extra-axial fluid
collections. Basal cisterns are patent.

VASCULAR: Moderate calcific atherosclerosis of the carotid siphons.

SKULL: No skull fracture. No significant scalp soft tissue swelling.

SINUSES/ORBITS: Mild paranasal sinus mucosal thickening. The
included ocular globes and orbital contents are non-suspicious.

OTHER: None.
IMPRESSION: No acute intracranial process.

Stable chronic changes including old RIGHT frontal lobe and LEFT
cerebellar/PICA territory infarcts.

## 2018-08-18 IMAGING — DX DG CHEST 1V
1 series · 1 of 1 positions shown · non-contrast
Comparison: 03/10/2016

CLINICAL DATA: Clearance for facility, some shortness of breath

EXAM:
CHEST 1 VIEW

[chest ap]
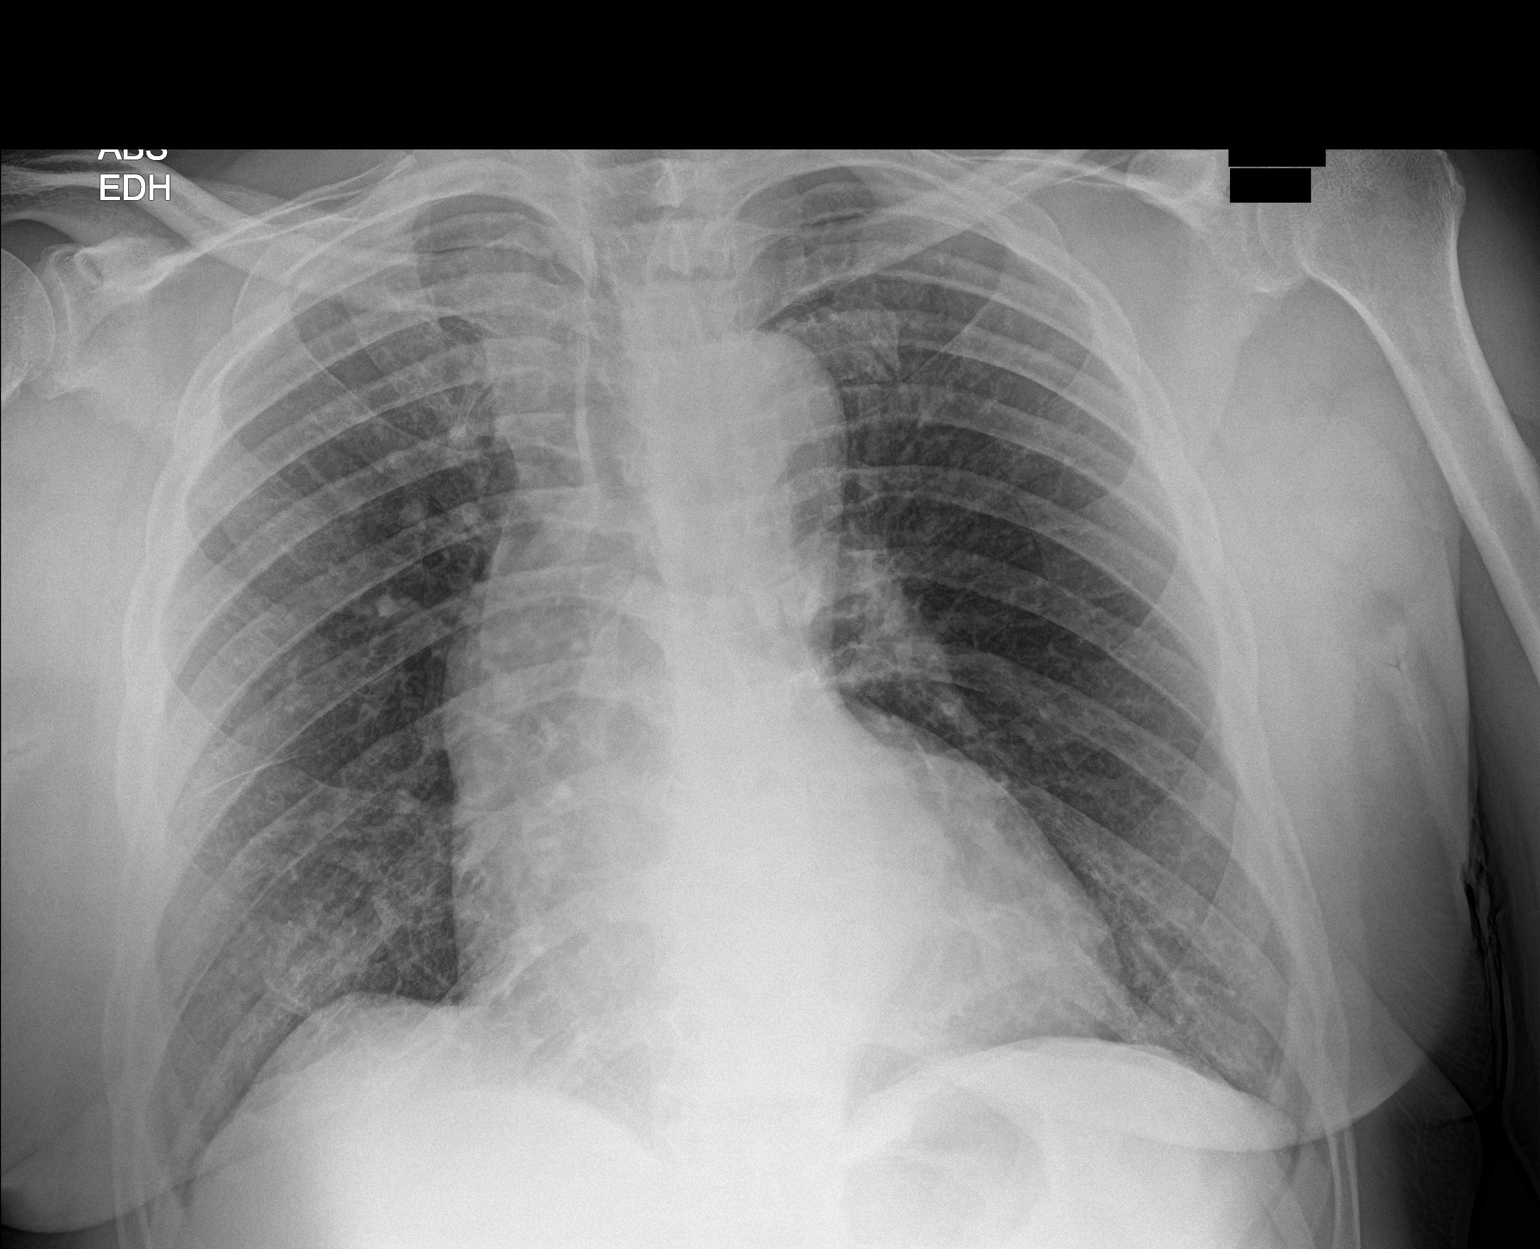

[1 of 1 positions shown; findings below may reference images not displayed]

FINDINGS: Mild cardiomegaly with minimal central congestion. No acute
consolidation or large pleural effusion. Mild atherosclerosis. No
pneumothorax.
IMPRESSION: Mild cardiomegaly.  No overt failure.  No acute infiltrate.
